# Patient Record
Sex: Male | Born: 1950
Health system: Southern US, Community
[De-identification: ages and names within clinical notes are randomized; demographics above are authoritative.]

## PROBLEM LIST (undated history)

## (undated) DIAGNOSIS — D649 Anemia, unspecified: Secondary | ICD-10-CM

## (undated) DIAGNOSIS — I519 Heart disease, unspecified: Secondary | ICD-10-CM

## (undated) DIAGNOSIS — I10 Essential (primary) hypertension: Secondary | ICD-10-CM

## (undated) DIAGNOSIS — R55 Syncope and collapse: Secondary | ICD-10-CM

## (undated) DIAGNOSIS — J449 Chronic obstructive pulmonary disease, unspecified: Secondary | ICD-10-CM

## (undated) DIAGNOSIS — I4891 Unspecified atrial fibrillation: Secondary | ICD-10-CM

## (undated) DIAGNOSIS — I509 Heart failure, unspecified: Secondary | ICD-10-CM

## (undated) HISTORY — DX: Syncope and collapse: R55

## (undated) HISTORY — PX: ESOPHAGUS SURGERY: SHX626

---

## 2000-04-13 ENCOUNTER — Encounter: Payer: Self-pay | Admitting: Emergency Medicine

## 2000-04-13 ENCOUNTER — Emergency Department (HOSPITAL_COMMUNITY): Admission: EM | Admit: 2000-04-13 | Discharge: 2000-04-13 | Payer: Self-pay | Admitting: Emergency Medicine

## 2000-12-25 HISTORY — PX: FOREIGN BODY REMOVAL ESOPHAGEAL: SHX5322

## 2000-12-25 HISTORY — PX: ESOPHAGOSCOPY: SUR460

## 2001-01-28 ENCOUNTER — Encounter: Payer: Self-pay | Admitting: Otolaryngology

## 2001-01-28 ENCOUNTER — Inpatient Hospital Stay (HOSPITAL_COMMUNITY): Admission: EM | Admit: 2001-01-28 | Discharge: 2001-02-07 | Payer: Self-pay | Admitting: Emergency Medicine

## 2001-01-28 ENCOUNTER — Encounter: Payer: Self-pay | Admitting: Emergency Medicine

## 2001-01-28 ENCOUNTER — Encounter (INDEPENDENT_AMBULATORY_CARE_PROVIDER_SITE_OTHER): Payer: Self-pay | Admitting: Specialist

## 2001-01-29 ENCOUNTER — Encounter: Payer: Self-pay | Admitting: Pulmonary Disease

## 2001-01-29 ENCOUNTER — Encounter: Payer: Self-pay | Admitting: Otolaryngology

## 2001-01-30 ENCOUNTER — Encounter: Payer: Self-pay | Admitting: Otolaryngology

## 2001-01-31 ENCOUNTER — Encounter: Payer: Self-pay | Admitting: Otolaryngology

## 2001-02-04 ENCOUNTER — Encounter: Payer: Self-pay | Admitting: Otolaryngology

## 2001-02-05 ENCOUNTER — Encounter: Payer: Self-pay | Admitting: Otolaryngology

## 2001-07-28 ENCOUNTER — Emergency Department (HOSPITAL_COMMUNITY): Admission: EM | Admit: 2001-07-28 | Discharge: 2001-07-28 | Payer: Self-pay | Admitting: Emergency Medicine

## 2002-07-06 ENCOUNTER — Encounter (INDEPENDENT_AMBULATORY_CARE_PROVIDER_SITE_OTHER): Payer: Self-pay | Admitting: *Deleted

## 2002-07-06 ENCOUNTER — Encounter: Payer: Self-pay | Admitting: Emergency Medicine

## 2002-07-06 ENCOUNTER — Inpatient Hospital Stay (HOSPITAL_COMMUNITY): Admission: EM | Admit: 2002-07-06 | Discharge: 2002-07-15 | Payer: Self-pay | Admitting: Emergency Medicine

## 2002-07-07 ENCOUNTER — Encounter: Payer: Self-pay | Admitting: Interventional Cardiology

## 2002-07-07 ENCOUNTER — Encounter: Payer: Self-pay | Admitting: Internal Medicine

## 2002-07-08 ENCOUNTER — Encounter: Payer: Self-pay | Admitting: Internal Medicine

## 2002-07-09 ENCOUNTER — Encounter: Payer: Self-pay | Admitting: Internal Medicine

## 2002-07-11 ENCOUNTER — Encounter: Payer: Self-pay | Admitting: Surgery

## 2002-07-11 ENCOUNTER — Encounter: Payer: Self-pay | Admitting: Internal Medicine

## 2002-07-14 ENCOUNTER — Encounter: Payer: Self-pay | Admitting: Internal Medicine

## 2003-04-25 ENCOUNTER — Emergency Department (HOSPITAL_COMMUNITY): Admission: EM | Admit: 2003-04-25 | Discharge: 2003-04-25 | Payer: Self-pay | Admitting: Emergency Medicine

## 2004-12-30 ENCOUNTER — Emergency Department (HOSPITAL_COMMUNITY): Admission: EM | Admit: 2004-12-30 | Discharge: 2004-12-30 | Payer: Self-pay | Admitting: Emergency Medicine

## 2005-02-20 ENCOUNTER — Emergency Department (HOSPITAL_COMMUNITY): Admission: EM | Admit: 2005-02-20 | Discharge: 2005-02-20 | Payer: Self-pay | Admitting: Emergency Medicine

## 2005-11-09 ENCOUNTER — Emergency Department (HOSPITAL_COMMUNITY): Admission: EM | Admit: 2005-11-09 | Discharge: 2005-11-09 | Payer: Self-pay | Admitting: Emergency Medicine

## 2007-12-07 ENCOUNTER — Emergency Department (HOSPITAL_COMMUNITY): Admission: EM | Admit: 2007-12-07 | Discharge: 2007-12-07 | Payer: Self-pay | Admitting: Emergency Medicine

## 2008-08-21 ENCOUNTER — Inpatient Hospital Stay (HOSPITAL_COMMUNITY): Admission: EM | Admit: 2008-08-21 | Discharge: 2008-08-28 | Payer: Self-pay | Admitting: Emergency Medicine

## 2008-08-21 ENCOUNTER — Ambulatory Visit: Payer: Self-pay | Admitting: Internal Medicine

## 2008-08-22 ENCOUNTER — Ambulatory Visit: Payer: Self-pay | Admitting: Gastroenterology

## 2008-08-24 ENCOUNTER — Encounter (INDEPENDENT_AMBULATORY_CARE_PROVIDER_SITE_OTHER): Payer: Self-pay | Admitting: Emergency Medicine

## 2008-08-26 ENCOUNTER — Encounter (INDEPENDENT_AMBULATORY_CARE_PROVIDER_SITE_OTHER): Payer: Self-pay | Admitting: Gastroenterology

## 2008-08-26 DIAGNOSIS — K209 Esophagitis, unspecified without bleeding: Secondary | ICD-10-CM | POA: Insufficient documentation

## 2008-08-26 DIAGNOSIS — K224 Dyskinesia of esophagus: Secondary | ICD-10-CM

## 2008-08-26 DIAGNOSIS — K2900 Acute gastritis without bleeding: Secondary | ICD-10-CM | POA: Insufficient documentation

## 2008-08-28 ENCOUNTER — Encounter (INDEPENDENT_AMBULATORY_CARE_PROVIDER_SITE_OTHER): Payer: Self-pay | Admitting: Family Medicine

## 2008-09-01 ENCOUNTER — Ambulatory Visit: Payer: Self-pay | Admitting: *Deleted

## 2008-09-16 ENCOUNTER — Telehealth (INDEPENDENT_AMBULATORY_CARE_PROVIDER_SITE_OTHER): Payer: Self-pay | Admitting: *Deleted

## 2008-09-21 ENCOUNTER — Ambulatory Visit: Payer: Self-pay | Admitting: Family Medicine

## 2008-09-21 DIAGNOSIS — I480 Paroxysmal atrial fibrillation: Secondary | ICD-10-CM | POA: Insufficient documentation

## 2008-09-21 DIAGNOSIS — J45909 Unspecified asthma, uncomplicated: Secondary | ICD-10-CM

## 2008-09-21 DIAGNOSIS — D508 Other iron deficiency anemias: Secondary | ICD-10-CM

## 2008-09-21 DIAGNOSIS — I495 Sick sinus syndrome: Secondary | ICD-10-CM | POA: Insufficient documentation

## 2008-09-21 DIAGNOSIS — I4891 Unspecified atrial fibrillation: Secondary | ICD-10-CM

## 2008-09-24 DIAGNOSIS — D529 Folate deficiency anemia, unspecified: Secondary | ICD-10-CM | POA: Insufficient documentation

## 2008-09-28 ENCOUNTER — Ambulatory Visit (HOSPITAL_COMMUNITY): Admission: RE | Admit: 2008-09-28 | Discharge: 2008-09-28 | Payer: Self-pay | Admitting: Family Medicine

## 2008-09-28 ENCOUNTER — Encounter (INDEPENDENT_AMBULATORY_CARE_PROVIDER_SITE_OTHER): Payer: Self-pay | Admitting: Family Medicine

## 2008-09-29 LAB — CONVERTED CEMR LAB
AST: 20 units/L (ref 0–37)
BUN: 13 mg/dL (ref 6–23)
Basophils Relative: 1 % (ref 0–1)
CO2: 20 meq/L (ref 19–32)
Calcium: 8.4 mg/dL (ref 8.4–10.5)
Eosinophils Relative: 7 % — ABNORMAL HIGH (ref 0–5)
Folate: 4.2 ng/mL
Glucose, Bld: 79 mg/dL (ref 70–99)
Hemoglobin: 11 g/dL — ABNORMAL LOW (ref 13.0–17.0)
MCV: 67.3 fL — ABNORMAL LOW (ref 78.0–100.0)
Monocytes Relative: 12 % (ref 3–12)
Neutro Abs: 3.8 10*3/uL (ref 1.7–7.7)
Platelets: 370 10*3/uL (ref 150–400)
TSH: 1.888 microintl units/mL (ref 0.350–4.50)
Total Bilirubin: 0.3 mg/dL (ref 0.3–1.2)
Total Protein: 7.8 g/dL (ref 6.0–8.3)
Vitamin B-12: 252 pg/mL (ref 211–911)
WBC: 8.1 10*3/uL (ref 4.0–10.5)

## 2008-10-01 ENCOUNTER — Ambulatory Visit: Payer: Self-pay | Admitting: Family Medicine

## 2008-10-01 LAB — CONVERTED CEMR LAB
Iron: 39 ug/dL — ABNORMAL LOW (ref 42–165)
Saturation Ratios: 12 % — ABNORMAL LOW (ref 20–55)
TIBC: 320 ug/dL (ref 215–435)

## 2008-10-18 ENCOUNTER — Telehealth (INDEPENDENT_AMBULATORY_CARE_PROVIDER_SITE_OTHER): Payer: Self-pay | Admitting: *Deleted

## 2008-11-09 ENCOUNTER — Encounter (INDEPENDENT_AMBULATORY_CARE_PROVIDER_SITE_OTHER): Payer: Self-pay | Admitting: Family Medicine

## 2008-11-11 ENCOUNTER — Telehealth (INDEPENDENT_AMBULATORY_CARE_PROVIDER_SITE_OTHER): Payer: Self-pay | Admitting: Family Medicine

## 2008-11-26 ENCOUNTER — Ambulatory Visit: Payer: Self-pay | Admitting: Family Medicine

## 2008-11-26 DIAGNOSIS — F528 Other sexual dysfunction not due to a substance or known physiological condition: Secondary | ICD-10-CM | POA: Insufficient documentation

## 2008-11-26 DIAGNOSIS — I10 Essential (primary) hypertension: Secondary | ICD-10-CM

## 2008-12-01 LAB — CONVERTED CEMR LAB
ALT: 16 units/L (ref 0–53)
AST: 20 units/L (ref 0–37)
BUN: 13 mg/dL (ref 6–23)
Calcium: 9 mg/dL (ref 8.4–10.5)
Cholesterol: 177 mg/dL (ref 0–200)
Creatinine, Ser: 1.11 mg/dL (ref 0.40–1.50)
Potassium: 4.2 meq/L (ref 3.5–5.3)
Sodium: 141 meq/L (ref 135–145)
Total Bilirubin: 0.4 mg/dL (ref 0.3–1.2)
Total CHOL/HDL Ratio: 3.8
VLDL: 15 mg/dL (ref 0–40)

## 2008-12-03 ENCOUNTER — Encounter (INDEPENDENT_AMBULATORY_CARE_PROVIDER_SITE_OTHER): Payer: Self-pay | Admitting: Family Medicine

## 2009-02-02 ENCOUNTER — Ambulatory Visit: Payer: Self-pay | Admitting: Family Medicine

## 2009-02-03 ENCOUNTER — Encounter (INDEPENDENT_AMBULATORY_CARE_PROVIDER_SITE_OTHER): Payer: Self-pay | Admitting: Family Medicine

## 2009-02-03 LAB — CONVERTED CEMR LAB
Eosinophils Relative: 6 % — ABNORMAL HIGH (ref 0–5)
Lymphocytes Relative: 30 % (ref 12–46)
MCV: 68.2 fL — ABNORMAL LOW (ref 78.0–100.0)
Monocytes Absolute: 1.3 10*3/uL — ABNORMAL HIGH (ref 0.1–1.0)
Neutrophils Relative %: 51 % (ref 43–77)
Platelets: 345 10*3/uL (ref 150–400)
RBC Folate: 418 ng/mL (ref 180–600)
Vitamin B-12: 370 pg/mL (ref 211–911)

## 2009-02-16 ENCOUNTER — Telehealth (INDEPENDENT_AMBULATORY_CARE_PROVIDER_SITE_OTHER): Payer: Self-pay | Admitting: *Deleted

## 2009-03-10 ENCOUNTER — Ambulatory Visit: Payer: Self-pay | Admitting: Family Medicine

## 2009-03-12 LAB — CONVERTED CEMR LAB
AST: 19 units/L (ref 0–37)
Alkaline Phosphatase: 95 units/L (ref 39–117)
BUN: 12 mg/dL (ref 6–23)
CO2: 25 meq/L (ref 19–32)
Chloride: 101 meq/L (ref 96–112)
Cholesterol: 172 mg/dL (ref 0–200)
Glucose, Bld: 83 mg/dL (ref 70–99)
Potassium: 3.8 meq/L (ref 3.5–5.3)
Sodium: 138 meq/L (ref 135–145)
Total Bilirubin: 0.2 mg/dL — ABNORMAL LOW (ref 0.3–1.2)
Triglycerides: 158 mg/dL — ABNORMAL HIGH (ref <150)

## 2009-04-29 ENCOUNTER — Telehealth (INDEPENDENT_AMBULATORY_CARE_PROVIDER_SITE_OTHER): Payer: Self-pay | Admitting: Family Medicine

## 2009-09-28 ENCOUNTER — Encounter: Payer: Self-pay | Admitting: Physician Assistant

## 2009-10-14 ENCOUNTER — Ambulatory Visit: Payer: Self-pay | Admitting: Physician Assistant

## 2009-10-25 ENCOUNTER — Ambulatory Visit: Payer: Self-pay | Admitting: Physician Assistant

## 2009-10-25 LAB — CONVERTED CEMR LAB
ALT: 10 units/L (ref 0–53)
AST: 18 units/L (ref 0–37)
Albumin: 3.9 g/dL (ref 3.5–5.2)
Basophils Absolute: 0.1 10*3/uL (ref 0.0–0.1)
Basophils Relative: 1 % (ref 0–1)
CO2: 22 meq/L (ref 19–32)
Chloride: 104 meq/L (ref 96–112)
Cholesterol: 151 mg/dL (ref 0–200)
Eosinophils Absolute: 0.4 10*3/uL (ref 0.0–0.7)
Eosinophils Relative: 5 % (ref 0–5)
Free T4: 0.92 ng/dL (ref 0.80–1.80)
Glucose, Bld: 69 mg/dL — ABNORMAL LOW (ref 70–99)
HCT: 35.6 % — ABNORMAL LOW (ref 39.0–52.0)
HDL: 44 mg/dL (ref >39)
LDL Cholesterol: 93 mg/dL (ref 0–99)
Lymphocytes Relative: 27 % (ref 12–46)
Lymphs Abs: 2.4 10*3/uL (ref 0.7–4.0)
Monocytes Absolute: 0.7 10*3/uL (ref 0.1–1.0)
Monocytes Relative: 8 % (ref 3–12)
Potassium: 4.1 meq/L (ref 3.5–5.3)
RBC: 5.23 M/uL (ref 4.22–5.81)
RDW: 16.4 % — ABNORMAL HIGH (ref 11.5–15.5)
Sodium: 141 meq/L (ref 135–145)
TSH: 3.059 microintl units/mL (ref 0.350–4.500)

## 2009-12-13 ENCOUNTER — Telehealth: Payer: Self-pay | Admitting: Physician Assistant

## 2009-12-28 ENCOUNTER — Encounter: Payer: Self-pay | Admitting: Physician Assistant

## 2010-01-13 ENCOUNTER — Telehealth: Payer: Self-pay | Admitting: Physician Assistant

## 2010-01-20 ENCOUNTER — Ambulatory Visit: Payer: Self-pay | Admitting: Physician Assistant

## 2010-01-20 DIAGNOSIS — E291 Testicular hypofunction: Secondary | ICD-10-CM | POA: Insufficient documentation

## 2010-01-21 ENCOUNTER — Telehealth: Payer: Self-pay | Admitting: Physician Assistant

## 2010-02-14 ENCOUNTER — Ambulatory Visit: Payer: Self-pay | Admitting: Physician Assistant

## 2010-02-14 LAB — CONVERTED CEMR LAB
ALT: 10 units/L (ref 0–53)
AST: 19 units/L (ref 0–37)
Albumin: 3.7 g/dL (ref 3.5–5.2)
BUN: 12 mg/dL (ref 6–23)
Calcium: 8.9 mg/dL (ref 8.4–10.5)
Creatinine, Ser: 1.08 mg/dL (ref 0.40–1.50)
Eosinophils Absolute: 0.5 10*3/uL (ref 0.0–0.7)
Lymphs Abs: 2.1 10*3/uL (ref 0.7–4.0)
MCHC: 30 g/dL (ref 30.0–36.0)
Monocytes Relative: 12 % (ref 3–12)
Neutro Abs: 3.7 10*3/uL (ref 1.7–7.7)
Platelets: 393 10*3/uL (ref 150–400)
Potassium: 4.2 meq/L (ref 3.5–5.3)
Total Bilirubin: 0.3 mg/dL (ref 0.3–1.2)
Total Protein: 7.9 g/dL (ref 6.0–8.3)

## 2010-02-15 ENCOUNTER — Telehealth: Payer: Self-pay | Admitting: Physician Assistant

## 2010-02-16 ENCOUNTER — Encounter: Payer: Self-pay | Admitting: Physician Assistant

## 2010-02-18 ENCOUNTER — Telehealth: Payer: Self-pay | Admitting: Physician Assistant

## 2010-02-24 ENCOUNTER — Telehealth: Payer: Self-pay | Admitting: Physician Assistant

## 2010-02-24 LAB — CONVERTED CEMR LAB
Saturation Ratios: 11 % — ABNORMAL LOW (ref 20–55)
Testosterone-% Free: 1.7 % (ref 1.6–2.9)
Testosterone: 194.83 ng/dL — ABNORMAL LOW (ref 350–890)

## 2010-03-12 ENCOUNTER — Telehealth: Payer: Self-pay | Admitting: Physician Assistant

## 2010-03-14 ENCOUNTER — Ambulatory Visit: Payer: Self-pay | Admitting: Physician Assistant

## 2010-03-14 LAB — CONVERTED CEMR LAB
LH: 4.8 milliintl units/mL (ref 1.5–9.3)
PSA: 0.3 ng/mL (ref 0.10–4.00)

## 2010-03-25 ENCOUNTER — Encounter: Payer: Self-pay | Admitting: Physician Assistant

## 2010-03-25 ENCOUNTER — Telehealth: Payer: Self-pay | Admitting: Physician Assistant

## 2010-03-25 DIAGNOSIS — E559 Vitamin D deficiency, unspecified: Secondary | ICD-10-CM

## 2010-03-29 ENCOUNTER — Telehealth (INDEPENDENT_AMBULATORY_CARE_PROVIDER_SITE_OTHER): Payer: Self-pay | Admitting: *Deleted

## 2010-06-14 ENCOUNTER — Telehealth: Payer: Self-pay | Admitting: Physician Assistant

## 2010-06-14 ENCOUNTER — Encounter: Payer: Self-pay | Admitting: Physician Assistant

## 2010-06-28 ENCOUNTER — Ambulatory Visit: Payer: Self-pay | Admitting: Physician Assistant

## 2010-06-28 ENCOUNTER — Telehealth: Payer: Self-pay | Admitting: Physician Assistant

## 2010-06-28 DIAGNOSIS — R259 Unspecified abnormal involuntary movements: Secondary | ICD-10-CM | POA: Insufficient documentation

## 2010-06-28 DIAGNOSIS — R5383 Other fatigue: Secondary | ICD-10-CM | POA: Insufficient documentation

## 2010-06-30 ENCOUNTER — Ambulatory Visit: Payer: Self-pay | Admitting: Physician Assistant

## 2010-07-01 LAB — CONVERTED CEMR LAB
ALT: 12 units/L (ref 0–53)
Albumin: 3.5 g/dL (ref 3.5–5.2)
Alkaline Phosphatase: 86 units/L (ref 39–117)
BUN: 12 mg/dL (ref 6–23)
Basophils Absolute: 0.1 10*3/uL (ref 0.0–0.1)
Basophils Relative: 1 % (ref 0–1)
CO2: 25 meq/L (ref 19–32)
Chloride: 103 meq/L (ref 96–112)
Creatinine, Ser: 0.9 mg/dL (ref 0.40–1.50)
Eosinophils Absolute: 0.6 10*3/uL (ref 0.0–0.7)
Eosinophils Relative: 7 % — ABNORMAL HIGH (ref 0–5)
Glucose, Bld: 82 mg/dL (ref 70–99)
HDL: 45 mg/dL (ref >39)
Hemoglobin: 10.6 g/dL — ABNORMAL LOW (ref 13.0–17.0)
Lymphocytes Relative: 31 % (ref 12–46)
MCHC: 31.2 g/dL (ref 30.0–36.0)
Neutro Abs: 3.9 10*3/uL (ref 1.7–7.7)
Neutrophils Relative %: 49 % (ref 43–77)
Potassium: 4.1 meq/L (ref 3.5–5.3)
Sodium: 138 meq/L (ref 135–145)
Total Bilirubin: 0.4 mg/dL (ref 0.3–1.2)
Total Protein: 7.4 g/dL (ref 6.0–8.3)
VLDL: 17 mg/dL (ref 0–40)
Vit D, 25-Hydroxy: 12 ng/mL — ABNORMAL LOW (ref 30–89)

## 2010-07-29 ENCOUNTER — Encounter: Payer: Self-pay | Admitting: Physician Assistant

## 2010-07-29 ENCOUNTER — Ambulatory Visit (HOSPITAL_BASED_OUTPATIENT_CLINIC_OR_DEPARTMENT_OTHER): Admission: RE | Admit: 2010-07-29 | Discharge: 2010-07-29 | Payer: Self-pay | Admitting: Internal Medicine

## 2010-07-31 ENCOUNTER — Ambulatory Visit: Payer: Self-pay | Admitting: Internal Medicine

## 2010-08-01 ENCOUNTER — Ambulatory Visit: Payer: Self-pay | Admitting: Physician Assistant

## 2010-08-01 LAB — CONVERTED CEMR LAB
Basophils Relative: 1 % (ref 0–1)
Eosinophils Absolute: 0.4 10*3/uL (ref 0.0–0.7)
MCV: 69.5 fL — ABNORMAL LOW (ref 78.0–100.0)
Neutro Abs: 4.1 10*3/uL (ref 1.7–7.7)
Platelets: 364 10*3/uL (ref 150–400)
RBC: 5.24 M/uL (ref 4.22–5.81)

## 2010-08-03 ENCOUNTER — Inpatient Hospital Stay (HOSPITAL_COMMUNITY): Admission: EM | Admit: 2010-08-03 | Discharge: 2010-08-05 | Payer: Self-pay | Admitting: Emergency Medicine

## 2010-08-03 ENCOUNTER — Encounter: Payer: Self-pay | Admitting: Physician Assistant

## 2010-08-05 ENCOUNTER — Telehealth: Payer: Self-pay | Admitting: Physician Assistant

## 2010-08-05 ENCOUNTER — Encounter: Payer: Self-pay | Admitting: Physician Assistant

## 2010-08-20 ENCOUNTER — Telehealth (INDEPENDENT_AMBULATORY_CARE_PROVIDER_SITE_OTHER): Payer: Self-pay | Admitting: *Deleted

## 2010-08-20 DIAGNOSIS — G4730 Sleep apnea, unspecified: Secondary | ICD-10-CM | POA: Insufficient documentation

## 2010-08-20 DIAGNOSIS — G4733 Obstructive sleep apnea (adult) (pediatric): Secondary | ICD-10-CM | POA: Insufficient documentation

## 2010-09-01 ENCOUNTER — Ambulatory Visit: Payer: Self-pay | Admitting: Physician Assistant

## 2010-09-01 DIAGNOSIS — R079 Chest pain, unspecified: Secondary | ICD-10-CM | POA: Insufficient documentation

## 2010-09-01 DIAGNOSIS — R42 Dizziness and giddiness: Secondary | ICD-10-CM | POA: Insufficient documentation

## 2010-09-01 DIAGNOSIS — K219 Gastro-esophageal reflux disease without esophagitis: Secondary | ICD-10-CM

## 2010-09-02 ENCOUNTER — Encounter: Payer: Self-pay | Admitting: Physician Assistant

## 2010-09-07 ENCOUNTER — Encounter: Payer: Self-pay | Admitting: Physician Assistant

## 2010-09-13 ENCOUNTER — Encounter: Payer: Self-pay | Admitting: Physician Assistant

## 2010-09-23 ENCOUNTER — Encounter: Payer: Self-pay | Admitting: Physician Assistant

## 2010-09-23 DIAGNOSIS — E291 Testicular hypofunction: Secondary | ICD-10-CM | POA: Insufficient documentation

## 2010-09-29 ENCOUNTER — Telehealth (INDEPENDENT_AMBULATORY_CARE_PROVIDER_SITE_OTHER): Payer: Self-pay | Admitting: *Deleted

## 2010-09-30 ENCOUNTER — Telehealth: Payer: Self-pay | Admitting: Physician Assistant

## 2010-09-30 ENCOUNTER — Ambulatory Visit: Payer: Self-pay | Admitting: Physician Assistant

## 2010-09-30 DIAGNOSIS — B356 Tinea cruris: Secondary | ICD-10-CM

## 2010-10-20 ENCOUNTER — Ambulatory Visit: Payer: Self-pay | Admitting: Nurse Practitioner

## 2010-12-20 ENCOUNTER — Encounter: Payer: Self-pay | Admitting: Physician Assistant

## 2011-01-03 ENCOUNTER — Encounter (INDEPENDENT_AMBULATORY_CARE_PROVIDER_SITE_OTHER): Payer: Self-pay | Admitting: Nurse Practitioner

## 2011-01-06 ENCOUNTER — Encounter (INDEPENDENT_AMBULATORY_CARE_PROVIDER_SITE_OTHER): Payer: Self-pay | Admitting: Nurse Practitioner

## 2011-01-09 ENCOUNTER — Encounter (INDEPENDENT_AMBULATORY_CARE_PROVIDER_SITE_OTHER): Payer: Self-pay | Admitting: Internal Medicine

## 2011-01-11 ENCOUNTER — Ambulatory Visit
Admission: RE | Admit: 2011-01-11 | Discharge: 2011-01-11 | Payer: Self-pay | Source: Home / Self Care | Attending: Nurse Practitioner | Admitting: Nurse Practitioner

## 2011-01-11 ENCOUNTER — Encounter (INDEPENDENT_AMBULATORY_CARE_PROVIDER_SITE_OTHER): Payer: Self-pay | Admitting: Nurse Practitioner

## 2011-01-12 LAB — CONVERTED CEMR LAB
ALT: 16 units/L (ref 0–53)
Albumin: 4 g/dL (ref 3.5–5.2)
Alkaline Phosphatase: 89 units/L (ref 39–117)
BUN: 9 mg/dL (ref 6–23)
CO2: 28 meq/L (ref 19–32)
Calcium: 9.4 mg/dL (ref 8.4–10.5)
Cholesterol: 166 mg/dL (ref 0–200)
Creatinine, Ser: 0.99 mg/dL (ref 0.40–1.50)
Glucose, Bld: 79 mg/dL (ref 70–99)
LDL Cholesterol: 100 mg/dL — ABNORMAL HIGH (ref 0–99)
Total Protein: 8 g/dL (ref 6.0–8.3)
VLDL: 16 mg/dL (ref 0–40)

## 2011-01-13 ENCOUNTER — Telehealth (INDEPENDENT_AMBULATORY_CARE_PROVIDER_SITE_OTHER): Payer: Self-pay | Admitting: Internal Medicine

## 2011-01-13 ENCOUNTER — Encounter (INDEPENDENT_AMBULATORY_CARE_PROVIDER_SITE_OTHER): Payer: Self-pay | Admitting: Nurse Practitioner

## 2011-01-13 LAB — CONVERTED CEMR LAB
Amphetamine Screen, Ur: NEGATIVE
Cocaine Metabolites: NEGATIVE
Marijuana Metabolite: NEGATIVE
Methadone: NEGATIVE
Opiate Screen, Urine: NEGATIVE
Phencyclidine (PCP): NEGATIVE
Propoxyphene: NEGATIVE

## 2011-01-16 ENCOUNTER — Encounter (INDEPENDENT_AMBULATORY_CARE_PROVIDER_SITE_OTHER): Payer: Self-pay | Admitting: Nurse Practitioner

## 2011-01-22 LAB — CONVERTED CEMR LAB
Basophils Absolute: 0.1 10*3/uL (ref 0.0–0.1)
Basophils Relative: 1 % (ref 0–1)
Eosinophils Absolute: 0.6 10*3/uL (ref 0.0–0.7)
Eosinophils Relative: 6 % — ABNORMAL HIGH (ref 0–5)
HCT: 35.9 % — ABNORMAL LOW (ref 39.0–52.0)
Hemoglobin: 10.8 g/dL — ABNORMAL LOW (ref 13.0–17.0)
Lymphocytes Relative: 23 % (ref 12–46)
Lymphs Abs: 2.3 10*3/uL (ref 0.7–4.0)
MCHC: 30.1 g/dL (ref 30.0–36.0)
MCV: 69.6 fL — ABNORMAL LOW (ref 78.0–100.0)
Monocytes Absolute: 1.3 10*3/uL — ABNORMAL HIGH (ref 0.1–1.0)
Monocytes Relative: 13 % — ABNORMAL HIGH (ref 3–12)
Neutro Abs: 5.8 10*3/uL (ref 1.7–7.7)
Neutrophils Relative %: 58 % (ref 43–77)
Platelets: 394 10*3/uL (ref 150–400)
RBC: 5.16 M/uL (ref 4.22–5.81)
RDW: 17.2 % — ABNORMAL HIGH (ref 11.5–15.5)
WBC: 10 10*3/uL (ref 4.0–10.5)

## 2011-01-24 NOTE — Progress Notes (Signed)
Summary: GI referral  Phone Note Outgoing Call   Summary of Call: Please check on GI referral.  He was supposed to see them months ago.  Has not rec'd appt.   Initial call taken by: Tereso Newcomer PA-C,  September 30, 2010 11:57 AM  Follow-up for Phone Call        I referral tp to [facility]  Follow-up by: Cheryll Dessert,  October 07, 2010 5:18 PM

## 2011-01-24 NOTE — Miscellaneous (Signed)
Summary: Lab Review   Review of labs indicates he has secondary hypogonadism. Will try to review with endocrinology to determine path of w/u.   Clinical Lists Changes  Problems: Added new problem of HYPOGONADOTROPIC HYPOGONADISM (ICD-253.4) Assessed HYPOGONADOTROPIC HYPOGONADISM as comment only - Review of labs indicates he has secondary hypogonadism. Will try to review with endocrinology to determine path of w/u. Assessed IRON DEFIC ANEMIA SEC DIET IRON INTAKE as comment only - review of labs indicates chronic iron deficiency he had a colo and EGD in 2009 has been referred back to [facility] this year due to dysphagia  His updated medication list for this problem includes:    Ferrous Sulfate 325 (65 Fe) Mg Tabs (Ferrous sulfate) .Marland Kitchen... 1 by mouth three times a day    Folic Acid 1 Mg Tabs (Folic acid) .Marland Kitchen... Take 1 tablet by mouth once a day        Impression & Recommendations:  Problem # 1:  HYPOGONADOTROPIC HYPOGONADISM (ICD-253.4) Review of labs indicates he has secondary hypogonadism. Will try to review with endocrinology to determine path of w/u.  Problem # 2:  IRON DEFIC ANEMIA SEC DIET IRON INTAKE (ICD-280.1) review of labs indicates chronic iron deficiency he had a colo and EGD in 2009 has been referred back to [facility] this year due to dysphagia  His updated medication list for this problem includes:    Ferrous Sulfate 325 (65 Fe) Mg Tabs (Ferrous sulfate) .Marland Kitchen... 1 by mouth three times a day    Folic Acid 1 Mg Tabs (Folic acid) .Marland Kitchen... Take 1 tablet by mouth once a day  Complete Medication List: 1)  Ferrous Sulfate 325 (65 Fe) Mg Tabs (Ferrous sulfate) .Marland Kitchen.. 1 by mouth three times a day 2)  Lisinopril 10 Mg Tabs (Lisinopril) .Marland Kitchen.. 1 by mouth once daily 3)  Amiodarone Hcl 200 Mg Tabs (Amiodarone hcl) .Marland Kitchen.. 1 by mouth once daily 4)  Folic Acid 1 Mg Tabs (Folic acid) .... Take 1 tablet by mouth once a day 5)  Reglan 10 Mg Tabs (Metoclopramide hcl) .... Take one tablet by mouth before  each meal and at bedtime to help with reflux 6)  Protonix 40 Mg Tbec (Pantoprazole sodium) .... Take 2 tablets by mouth every 12 hours 7)  Advair Diskus 250-50 Mcg/dose Misc (Fluticasone-salmeterol) .Marland Kitchen.. 1 puff 2 times daily rinse and spit after use 8)  Proventil Hfa 108 (90 Base) Mcg/act Aers (Albuterol sulfate) .Marland Kitchen.. 1-2 puffs q 4-6 hours as needed

## 2011-01-24 NOTE — Progress Notes (Signed)
  Phone Note Outgoing Call   Summary of Call: Patient's cardiologist requested some labs recently. I believe he requested CMET, CBC, TSH and Lipids. Make sure you fax him a copy. Initial call taken by: Tereso Newcomer PA-C,  February 24, 2010 3:45 PM  Follow-up for Phone Call        yes faxed copy to them Follow-up by: [place] Shannon,  February 24, 2010 3:48 PM

## 2011-01-24 NOTE — Progress Notes (Signed)
Summary: Anemia  Phone Note Outgoing Call   Summary of Call: Patient states that he has a follow-up appointment with GI  next month..... Patient current  in the hospital... been in hospital since 08/03/2010. sheduled an appointment for hosptial follow-up on 09/01/2010.Marland Kitchen [place] Shannon  August 05, 2010 5:01 PM   Blood counts are the same. Please make sure he has a f/u appt scheduled with GI at [facility].  IF no, please let me know. I am writing a referral letter.  Please make sure this goes to [facility]. Have him do stool cards x 3 as soon as he can do them. Initial call taken by: Tereso Newcomer PA-C,  August 05, 2010 9:00 AM  Follow-up for Phone Call        Left message on answering machine for pt to call back...Marland KitchenMarland KitchenArmenia Shannon  August 05, 2010 9:10 AM         EGD  Procedure date:  08/25/2008  Findings:      mild gastritis esophagitis hiatal hernia  Comments:      done for anemia, heme + stool   Past History:  Past Medical History: Current Problems:  IRON DEFIC ANEMIA SEC DIET IRON INTAKE (ICD-280.1)..Lab result: Hemoccults/o EGD and colonoscopy 08/2008.   a.  colo with hem's and diverticula and EGD with gastritis (d/c summ. indicates probable chronic blood loss) ASTHMA, UNSPECIFIED, UNSPECIFIED STATUS (ICD-493.90) ATRIAL FIBRILLATION (ICD-427.31)...sees Dr.Harwani SICK SINUS SYNDROME (ICD-427.81) NEEDLE PHOBIA...medical anxiety chest pain admx 8.2011   a.  myoview neg for ischemia   Impression & Recommendations:  Problem # 1:  IRON DEFIC ANEMIA SEC DIET IRON INTAKE (ICD-280.1) chronic hgb electrophoresis previously normal had colo in [place] in 08/2008 with heme + stools had EGD as well gastritis on EGD and Lg hem's on colo ? chronic GI blood loss on long term iron tx supposed to be following up with GI at [facility]  His updated medication list for this problem includes:    Ferrous Sulfate 325 (65 Fe) Mg Tabs (Ferrous sulfate) .Marland Kitchen... 1 by mouth three times a day    Folic  Acid 1 Mg Tabs (Folic acid) .Marland Kitchen... Take 1 tablet by mouth once a day  Complete Medication List: 1)  Ferrous Sulfate 325 (65 Fe) Mg Tabs (Ferrous sulfate) .Marland Kitchen.. 1 by mouth three times a day 2)  Lisinopril 10 Mg Tabs (Lisinopril) .Marland Kitchen.. 1 by mouth once daily 3)  Amiodarone Hcl 200 Mg Tabs (Amiodarone hcl) .Marland Kitchen.. 1 by mouth once daily 4)  Folic Acid 1 Mg Tabs (Folic acid) .... Take 1 tablet by mouth once a day 5)  Reglan 10 Mg Tabs (Metoclopramide hcl) .... Take one tablet by mouth before each meal and at bedtime to help with reflux 6)  Protonix 40 Mg Tbec (Pantoprazole sodium) .... Take 2 tablets by mouth every 12 hours 7)  Advair Diskus 500-50 Mcg/dose Aepb (Fluticasone-salmeterol) .Marland Kitchen.. 1 puff two times a day (rinse and spit after each use) 8)  Proventil Hfa 108 (90 Base) Mcg/act Aers (Albuterol sulfate) .Marland Kitchen.. 1-2 puffs q 4-6 hours as needed 9)  Ergocalciferol 50000 Unit Caps (Ergocalciferol) .Marland Kitchen.. 1 by mouth once a week for 12 weeks

## 2011-01-24 NOTE — Letter (Signed)
Summary: [facility]/CHIEF COMPLAINT//HYPOGONADISM  [facility]/CHIEF COMPLAINT//HYPOGONADISM   Imported By: Arta Bruce 10/11/2010 15:35:51  _____________________________________________________________________  External Attachment:    Type:   Image     Comment:   External Document

## 2011-01-24 NOTE — Progress Notes (Signed)
  Phone Note Outgoing Call   Summary of Call: I ordered labs on him when I saw him in Jan. They were never done and I thought they would be done with the labs he came in for today. They were not done. See labs under orders and have lab add on or bring patient back for these. Printed off order and put on your desk. Initial call taken by: Brynda Rim,  February 15, 2010 5:57 PM  Follow-up for Phone Call        pt had labs completed on 2/21 please see results.. Follow-up by: Mikey College CMA,  February 16, 2010 10:46 AM  Additional Follow-up for Phone Call Additional follow up Details #1::        added labs to order Additional Follow-up by: [place] Shannon,  February 16, 2010 1:46 PM

## 2011-01-24 NOTE — Assessment & Plan Note (Signed)
Summary: FU FROM APRIL FOR BP///KT   Vital Signs:  Patient profile:   60 year old male Weight:      247 pounds Temp:     97.8 degrees F oral Pulse rate:   62 / minute Pulse rhythm:   regular Resp:     18 per minute BP sitting:   134 / 81  (left arm) Cuff size:   large  Vitals Entered By: Levon Hedger (June 28, 2010 3:42 PM) CC: follow-up blood pressure, involuntary shaking in  the right arm, Hypertension Management Is Patient Diabetic? No Pain Assessment Patient in pain? no       Does patient need assistance? Functional Status Self care Ambulation Normal   Primary Care Provider:  Tereso Newcomer PA-C  CC:  follow-up blood pressure, involuntary shaking in  the right arm, and Hypertension Management.  History of Present Illness: Here for f/u.  Missed last appt.  On someone else's schedule today but I had a no show and he was moved to my schedule.    Hypogonadism:  For some reason, never got a referral to endocrinology.  Will recheck on that.  Vit. D:  Has started on Vitamin D replacement.    HTN:  BP stable.  States he has a h/o chest tightness.  He sees Dr. Sharyn Lull for h/o Afib and chonic amiodarone therapy.  He states he has seen Dr. Sharyn Lull for this tightness.  Thinks it is related to stress or caffeine.  If he drinks water, he has less symptoms.  No exertional chest pain.  Does have DOE related to asthma.  No radiation.    Esophageal dysmotility:  Was to see GI at [facility].  States appt had to be rescheduled but [facility] never called him back.  Will check on getting him back.  Continues to have symptoms of dysphagia and regurgitation and GERD symptoms as noted above with chest discomfort with caffeine despite being on two times a day PPI and reglan.  Patient has controlled symptoms himself with controlling portion size of meals, etc.  No hematemesis or hematochezia or melena.  Right arm: Has noted involuntary shaking in right arm for about 2 years.  Thinks it is getting worse.   Notes with doing activity with right arm.  He will have to hold his arm to make it stop.  No other shaking.  Thinks his aunt had a tremor.  Not sure about anyone else.  Asthma History    Asthma Control Assessment:    Age range: 12+ years    Symptoms: 0-2 days/week    Nighttime Awakenings: 0-2/month    Interferes w/ normal activity: no limitations    SABA use (not for EIB): >2 days/week    Asthma Control Assessment: Not Well Controlled  Hypertension History:      Positive major cardiovascular risk factors include male age 31 years old or older and hypertension.  Negative major cardiovascular risk factors include non-tobacco-user status.     Problems Prior to Update: 1)  Tremor  (ICD-781.0) 2)  Fatigue  (ICD-780.79) 3)  Vitamin D Deficiency  (ICD-268.9) 4)  Hypogonadotropic Hypogonadism  (ICD-253.4) 5)  Other Testicular Hypofunction  (ICD-257.2) 6)  Esophageal Motility Disorder  (ICD-530.5) 7)  Gastritis, Acute  (ICD-535.00) 8)  Esophagitis  (ICD-530.10) 9)  Essential Hypertension, Benign  (ICD-401.1) 10)  Erectile Dysfunction  (ICD-302.72) 11)  Folate-deficiency Anemia  (ICD-281.2) 12)  Iron Defic Anemia Sec Diet Iron Intake  (ICD-280.1) 13)  Asthma, Unspecified, Unspecified Status  (ICD-493.90)  14)  Atrial Fibrillation  (ICD-427.31) 15)  Sick Sinus Syndrome  (ICD-427.81)  Current Medications (verified): 1)  Ferrous Sulfate 325 (65 Fe) Mg Tabs (Ferrous Sulfate) .Marland Kitchen.. 1 By Mouth Three Times A Day 2)  Lisinopril 10 Mg Tabs (Lisinopril) .Marland Kitchen.. 1 By Mouth Once Daily 3)  Amiodarone Hcl 200 Mg Tabs (Amiodarone Hcl) .Marland Kitchen.. 1 By Mouth Once Daily 4)  Folic Acid 1 Mg Tabs (Folic Acid) .... Take 1 Tablet By Mouth Once A Day 5)  Reglan 10 Mg Tabs (Metoclopramide Hcl) .... Take One Tablet By Mouth Before Each Meal and At Bedtime To Help With Reflux 6)  Protonix 40 Mg  Tbec (Pantoprazole Sodium) .... Take 2 Tablets By Mouth Every 12 Hours 7)  Advair Diskus 250-50 Mcg/dose Misc  (Fluticasone-Salmeterol) .Marland Kitchen.. 1 Puff 2 Times Daily Rinse and Spit After Use 8)  Proventil Hfa 108 (90 Base) Mcg/act Aers (Albuterol Sulfate) .Marland Kitchen.. 1-2 Puffs Q 4-6 Hours As Needed 9)  Ergocalciferol 50000 Unit Caps (Ergocalciferol) .Marland Kitchen.. 1 By Mouth Once A Week For 12 Weeks  Allergies (verified): No Known Drug Allergies  Past History:  Past Medical History: Last updated: 12/13/2009 Current Problems:  IRON DEFIC ANEMIA SEC DIET IRON INTAKE (ICD-280.1)..Lab result: Hemoccults/o EGD and colonoscopy 08/2008.   a.  colo with hem's and diverticula and EGD with gastritis (d/c summ. indicates probable chronic blood loss) ASTHMA, UNSPECIFIED, UNSPECIFIED STATUS (ICD-493.90) ATRIAL FIBRILLATION (ICD-427.31)...sees Dr.Harwani SICK SINUS SYNDROME (ICD-427.81) NEEDLE PHOBIA...medical anxiety  Past Surgical History: Last updated: 09/21/2008 s/p esophageal perforation and foreign bogy 01/2001 s/p excision 11x7x11 cm abdominal wall lipoma 07/2001...[facility] 06/2002 pneumonia with right exudative pleural effusion. 2-D echo 08/2008. Myoview 08/2008...no ischemia.EF 59%  s/p colonoscopy([place])...08/2008 s/p EGD([place])...08/2008  Review of Systems       + daytime hypersomnolence + snoring history   Physical Exam  General:  alert, well-developed, and well-nourished.   Head:  normocephalic and atraumatic.   Neck:  supple and no carotid bruits.   Lungs:  normal breath sounds.   Heart:  normal rate and regular rhythm.   Abdomen:  soft, non-tender, normal bowel sounds, and no hepatomegaly.   Msk:  normal ROM.   Extremities:  no edema Neurologic:  alert & oriented X3 and cranial nerves II-XII intact.   right arm starts to shake with maintaining active abduction of right arm; resolves with assistance or rest strength in BUE is normal and equal bilat biceps, triceps and brachioradialis DTRs 2+ bilat finger to nose, rapid alt movements all normal neg rhomberg normal gait no pill rolling or  shuffling gait noted Psych:  good eye contact.     Impression & Recommendations:  Problem # 1:  VITAMIN D DEFICIENCY (ICD-268.9) due for repeat level  Orders: T-Vitamin D (25-Hydroxy) ([id number])  Problem # 2:  ASTHMA, UNSPECIFIED, UNSPECIFIED STATUS (ICD-493.90) Assessment: Improved change to Advair 500/50 to see if his symptoms can be better controlled  His updated medication list for this problem includes:    Advair Diskus 500-50 Mcg/dose Aepb (Fluticasone-salmeterol) .Marland Kitchen... 1 puff two times a day (rinse and spit after each use)    Proventil Hfa 108 (90 Base) Mcg/act Aers (Albuterol sulfate) .Marland Kitchen... 1-2 puffs q 4-6 hours as needed  Problem # 3:  ESSENTIAL HYPERTENSION, BENIGN (ICD-401.1) controlled  His updated medication list for this problem includes:    Lisinopril 10 Mg Tabs (Lisinopril) .Marland Kitchen... 1 by mouth once daily  Orders: T-Comprehensive Metabolic Panel [id number]) T-Lipid Profile ([id number])  Problem # 4:  HYPOGONADOTROPIC HYPOGONADISM (ICD-253.4) will recheck on  referral to Endocrinology  Problem # 5:  FATIGUE (ICD-780.79)  ? muscle fatigue as a cause of his arm symptoms also, notes daytime hypersomnolence will get sleep study advised him to do simple strengthening exercises at home  Orders: Split Night (Split Night)  Problem # 6:  ATRIAL FIBRILLATION (ICD-427.31)  needs labs per cardiologist CMET TSH Lipids  His updated medication list for this problem includes:    Amiodarone Hcl 200 Mg Tabs (Amiodarone hcl) .Marland Kitchen... 1 by mouth once daily  Orders: T-Comprehensive Metabolic Panel [id number]) T-Lipid Profile ([id number]) T-TSH [id number])  Problem # 7:  TREMOR (ICD-781.0) ? muscle fatigue as a cause of his arm symptoms I witnessed his tremor in the office and it was during the exam when he tried to maintain active abduction I provided rest for his arm and the shaking immediately stopped really appeared to be some type of muscle fatigue he  denies any increased activity or injury  also, notes daytime hypersomnolence will get sleep study advised him to do simple strengthening exercises at home f/u in few months  Complete Medication List: 1)  Ferrous Sulfate 325 (65 Fe) Mg Tabs (Ferrous sulfate) .Marland Kitchen.. 1 by mouth three times a day 2)  Lisinopril 10 Mg Tabs (Lisinopril) .Marland Kitchen.. 1 by mouth once daily 3)  Amiodarone Hcl 200 Mg Tabs (Amiodarone hcl) .Marland Kitchen.. 1 by mouth once daily 4)  Folic Acid 1 Mg Tabs (Folic acid) .... Take 1 tablet by mouth once a day 5)  Reglan 10 Mg Tabs (Metoclopramide hcl) .... Take one tablet by mouth before each meal and at bedtime to help with reflux 6)  Protonix 40 Mg Tbec (Pantoprazole sodium) .... Take 2 tablets by mouth every 12 hours 7)  Advair Diskus 500-50 Mcg/dose Aepb (Fluticasone-salmeterol) .Marland Kitchen.. 1 puff two times a day (rinse and spit after each use) 8)  Proventil Hfa 108 (90 Base) Mcg/act Aers (Albuterol sulfate) .Marland Kitchen.. 1-2 puffs q 4-6 hours as needed 9)  Ergocalciferol 50000 Unit Caps (Ergocalciferol) .Marland Kitchen.. 1 by mouth once a week for 12 weeks  Other Orders: T-CBC w/Diff ([id number])  Hypertension Assessment/Plan:      The patient's hypertensive risk group is category B: At least one risk factor (excluding diabetes) with no target organ damage.  His calculated 10 year risk of coronary heart disease is 7 %.  Today's blood pressure is 134/81.  His blood pressure goal is < 140/90.   Patient Instructions: 1)  Please schedule a follow-up appointment in 3 months with Darah Simkin for blood pressure, asthma and follow up on right arm.  2)  I have given you a new prescription for a higher dose of Advair.  Get it filled when you run out of your current inhaler. 3)  Do the exercises I showed you 3-4 times a week.  Do 2-3 sets of about 10 repetitions each time. 4)  Someone should call you about a referral to the gastroenterologist at [facility] and the Endocrinologist in [place].  If you do not hear anything in 2  weeks, call us. Prescriptions: ADVAIR DISKUS 500-50 MCG/DOSE AEPB (FLUTICASONE-SALMETEROL) 1 puff two times a day (rinse and spit after each use)  #1 x 5   Entered and Authorized by:   Tereso Newcomer PA-C   Signed by:   Tereso Newcomer PA-C on 06/28/2010   Method used:   Print then Give to Patient   RxID:   [id number]

## 2011-01-24 NOTE — Letter (Signed)
Summary: *HSN Results Follow up  [facility]  [address] [place], [place] [address]   Phone: [phone]  Fax: [phone]      09/02/2010   [facility] [address] [place], [place]  [address]   Dear  [name],                            ____S.Drinkard,FNP   ____D. Gore,FNP       ____B. McPherson,MD   ____V. Rankins,MD    ____E. Mulberry,MD    ____N. Daphine Deutscher, FNP  ____D. Reche Dixon, MD    ____K. Philipp Deputy, MD    __x__S. Alben Spittle, PA-C     This letter is to inform you that your recent test(s):  _______Pap Smear    ___x____Lab Test     _______X-ray    ___x____ is within acceptable limits  _______ requires a medication change  _______ requires a follow-up lab visit  _______ requires a follow-up visit with your provider   Comments: Blood counts are stable.  Please follow up with gastroenterology at [facility]       _________________________________________________________ If you have any questions, please contact our office                     Sincerely,  Tereso Newcomer PA-C [facility]

## 2011-01-24 NOTE — Progress Notes (Signed)
  Phone Note Outgoing Call   Summary of Call: I spoke to Dr. Everardo All at [facility] (endocrinologist). He recommended that the patient come see him for his low testosterone. Referral in system. Please notify patient. I have not sent to [facility]. I can talk to him if needed.  Initial call taken by: Tereso Newcomer PA-C,  March 25, 2010 3:45 PM  Follow-up for Phone Call        pt aware of referral....  Follow-up by: [place] Shannon,  March 29, 2010 12:22 PM       Impression & Recommendations:  Problem # 1:  HYPOGONADOTROPIC HYPOGONADISM (ICD-253.4)  Orders: Endocrinology Referral (Endocrine)  Complete Medication List: 1)  Ferrous Sulfate 325 (65 Fe) Mg Tabs (Ferrous sulfate) .Marland Kitchen.. 1 by mouth three times a day 2)  Lisinopril 10 Mg Tabs (Lisinopril) .Marland Kitchen.. 1 by mouth once daily 3)  Amiodarone Hcl 200 Mg Tabs (Amiodarone hcl) .Marland Kitchen.. 1 by mouth once daily 4)  Folic Acid 1 Mg Tabs (Folic acid) .... Take 1 tablet by mouth once a day 5)  Reglan 10 Mg Tabs (Metoclopramide hcl) .... Take one tablet by mouth before each meal and at bedtime to help with reflux 6)  Protonix 40 Mg Tbec (Pantoprazole sodium) .... Take 2 tablets by mouth every 12 hours 7)  Advair Diskus 250-50 Mcg/dose Misc (Fluticasone-salmeterol) .Marland Kitchen.. 1 puff 2 times daily rinse and spit after use 8)  Proventil Hfa 108 (90 Base) Mcg/act Aers (Albuterol sulfate) .Marland Kitchen.. 1-2 puffs q 4-6 hours as needed 9)  Ergocalciferol 50000 Unit Caps (Ergocalciferol) .Marland Kitchen.. 1 by mouth once a week for 12 weeks

## 2011-01-24 NOTE — Assessment & Plan Note (Signed)
Summary: F/u on Groin pain   Vital Signs:  Patient profile:   60 year old male Weight:      246.6 pounds BMI:     39.95 O2 Sat:      100 % on Room air Temp:     97.4 degrees F oral Pulse rate:   66 / minute Pulse rhythm:   regular Resp:     20 per minute BP sitting:   120 / 70  (left arm) Cuff size:   large  Vitals Entered By: Levon Hedger (October 20, 2010 11:49 AM)  Nutrition Counseling: Patient's BMI is greater than 25 and therefore counseled on weight management options.  O2 Flow:  Room air  Serial Vital Signs/Assessments:  Comments: 1:00 PM peak flow - 200, 200, 200 done by Steward [employer] By: Lehman Prom FNP   CC: follow up visit....had a black out spell on Tuesday and was disoriented that has never happened to him before this has him concerned, Hypertension Management Is Patient Diabetic? No Pain Assessment Patient in pain? no       Does patient need assistance? Functional Status Self care Ambulation Normal   Primary Care Provider:  Tereso Newcomer PA-C  CC:  follow up visit....had a black out spell on Tuesday and was disoriented that has never happened to him before this has him concerned and Hypertension Management.  History of Present Illness:  Pt into the office for f/u on groin pain as per Tereso Newcomer. Pt reports that pain has resolved at this time  Syncopal episode earlier this week He was in the kitchen making a salad and then the next thing he new he was regaining consciousness on the floor.  Pt lives alone. No witnesses No loss of bowel or bladder Pt did not go to the hospital He admits that he was some disoriented some following the event he went to bed and woke the next day still feeling "rough" but had to move on Pt has a cardiologist - Dr. Marlis Edelson (sees every other month)  Pt did not bring his medications into the office today during visit - pt was advised to bring all meds  Sleep Apnea - pt does have a CPAP machine and admits that  he does not wear it consecutively. He was not wearing it on the night before his syncopal episode  Asthma History    Asthma Control Assessment:    Age range: 12+ years    Symptoms: 0-2 days/week    Nighttime Awakenings: 1-3/week    Interferes w/ normal activity: no limitations    SABA use (not for EIB): >2 days/week    Exacerbations requiring oral systemic steroids: 0-1/year    Asthma Control Assessment: Not Well Controlled  Hypertension History:      He denies headache, chest pain, and palpitations.  He notes no problems with any antihypertensive medication side effects.        Positive major cardiovascular risk factors include male age 36 years old or older and hypertension.  Negative major cardiovascular risk factors include no history of diabetes or hyperlipidemia and non-tobacco-user status.      Habits & Providers  Alcohol-Tobacco-Diet     Alcohol drinks/day: 0     Tobacco Status: never  Exercise-Depression-Behavior     Does Patient Exercise: no     Have you felt down or hopeless? yes     Have you felt little pleasure in things? yes     Depression Counseling: not indicated; screening negative for depression  Drug Use: no  Allergies (verified): No Known Drug Allergies  Social History: Does Patient Exercise:  no  Review of Systems General:  + syncopal episode earlier this week. CV:  Denies chest pain or discomfort. Resp:  Denies cough. GI:  Denies abdominal pain, nausea, and vomiting.  Physical Exam  General:  alert.   Head:  normocephalic.   Eyes:  pupils round.   Lungs:  expiratory wheezes Heart:  normal rate and regular rhythm.   Abdomen:  obese Msk:  up to the exam Neurologic:  alert & oriented X3.   Psych:  Oriented X3.     Impression & Recommendations:  Problem # 1:  ESSENTIAL HYPERTENSION, BENIGN (ICD-401.1) BP doing well today Continue current meds His updated medication list for this problem includes:    Cozaar 50 Mg Tabs (Losartan  potassium) .Marland Kitchen... Take 1 tablet by mouth once a day for blood pressure (pharmacy: lisinopril is d/c'd)  Problem # 2:  ASTHMA, UNSPECIFIED, UNSPECIFIED STATUS (ICD-493.90) pt to continue current meds some wheezes today on exam asthma action plan given to pt His updated medication list for this problem includes:    Advair Diskus 500-50 Mcg/dose Aepb (Fluticasone-salmeterol) .Marland Kitchen... 1 puff two times a day (rinse and spit after each use)    Proventil Hfa 108 (90 Base) Mcg/act Aers (Albuterol sulfate) .Marland Kitchen... 1-2 puffs q 4-6 hours as needed  Orders: Pulse Oximetry (single measurment) (94760) Peak Flow Rate (94150)  Problem # 3:  SICK SINUS SYNDROME (ICD-427.81) pt has a cardiology  advised him to call there regarding his syncopal episode His updated medication list for this problem includes:    Amiodarone Hcl 200 Mg Tabs (Amiodarone hcl) .Marland Kitchen... 1 by mouth once daily  Problem # 4:  GERD (ICD-530.81) Symptoms stable His updated medication list for this problem includes:    Protonix 40 Mg Tbec (Pantoprazole sodium) .Marland Kitchen... Take 1 tablet by mouth two times a day  Complete Medication List: 1)  Ferrous Sulfate 325 (65 Fe) Mg Tabs (Ferrous sulfate) .Marland Kitchen.. 1 by mouth three times a day 2)  Cozaar 50 Mg Tabs (Losartan potassium) .... Take 1 tablet by mouth once a day for blood pressure (pharmacy: lisinopril is d/c'd) 3)  Amiodarone Hcl 200 Mg Tabs (Amiodarone hcl) .Marland Kitchen.. 1 by mouth once daily 4)  Folic Acid 1 Mg Tabs (Folic acid) .... Take 1 tablet by mouth once a day 5)  Reglan 10 Mg Tabs (Metoclopramide hcl) .... Take one tablet by mouth before each meal and at bedtime to help with reflux 6)  Protonix 40 Mg Tbec (Pantoprazole sodium) .... Take 1 tablet by mouth two times a day 7)  Advair Diskus 500-50 Mcg/dose Aepb (Fluticasone-salmeterol) .Marland Kitchen.. 1 puff two times a day (rinse and spit after each use) 8)  Proventil Hfa 108 (90 Base) Mcg/act Aers (Albuterol sulfate) .Marland Kitchen.. 1-2 puffs q 4-6 hours as needed 9)   Ergocalciferol 50000 Unit Caps (Ergocalciferol) .Marland Kitchen.. 1 by mouth once a week for 12 weeks 10)  Claritin 10 Mg Tabs (Loratadine) .... Take 1 tablet by mouth once a day 11)  Ketoconazole 2 % Crea (Ketoconazole) .... Apply to groin rash two times a day until gone; continue one week after resolution  Asthma Management Plan    Asthma Severity: Moderate Persistent    Control Assessment: Not Well Controlled    Personal best PEF: 200 liters/minute    Predicted PEF: 569 liters/minute    Working PEF: 569 liters/minute    Plan based on PEF formula: Nunn and  Dinah Beers Zone: (Range: 460 to 570) ADVAIR DISKUS 500-50 MCG/DOSE AEPB:  2 puffs every 12 hours  Yellow Zone: PROVENTIL HFA 108 (90 BASE) MCG/ACT AERS:  4 puffs every 4-6 hours  Red Zone: Call your physician for shortness of breath.    Hypertension Assessment/Plan:      The patient's hypertensive risk group is category B: At least one risk factor (excluding diabetes) with no target organ damage.  His calculated 10 year risk of coronary heart disease is 11 %.  Today's blood pressure is 120/70.  His blood pressure goal is < 140/90.   Patient Instructions: 1)  Call cardiology and inform them of your recent episode. 2)  This is concerning especially with your history. 3)  Blood pressure is ok today. 4)  Pulse is 66 5)  Asthma - some wheezes today on exam 6)  Use advair two times a day (rinse mouth after use) 7)  Also use your albuterol inhaler as needed  8)  You have declined the flu vaccine today. If you change your mind then schedule a  nurse visit in this office.  Strongly encourage you to get the flu vaccine   Orders Added: 1)  Est. Patient Level III [35009] 2)  Pulse Oximetry (single measurment) [94760] 3)  Peak Flow Rate [94150]

## 2011-01-24 NOTE — Progress Notes (Signed)
Summary: Endocrinology Referral & GI referral  Phone Note Outgoing Call   Summary of Call: 1. Seeing patient today.  He never got a referral to endocrinology.  Can we check into this?  Phone note and referral done in April.  Changed date of referral to today.  Please refer to endocrinology for hypogonadism.  2. Was supposed to see [facility] few months ago for follow up on esophageal dysmotility.  He did have an appt but had to be rescheduled.  He never heard back from [facility].  Please provide him with info. to reschedule or go ahead and reschedule. Initial call taken by: Tereso Newcomer PA-C,  June 28, 2010 4:33 PM  Follow-up for Phone Call        Dr.Ellison does not accept the [facility] card.The Pt would need [phone] as a new Pt.Lt messsage for pt to call back.Will also give # to [facility] to R/S appt according to his availability.Pt would like for me to send Endocrinology referral to [facility]. Follow-up by: Candi Leash,  June 30, 2010 3:53 PM  Additional Follow-up for Phone Call Additional follow up Details #1::        I was not aware of that.  I have spoken to him a few times about different patients and he always says he would be happy to see them.  Go ahead and send referral to [facility]. Thank you.  Additional Follow-up by: Tereso Newcomer PA-C,  June 30, 2010 4:59 PM    Additional Follow-up for Phone Call Additional follow up Details #2::    Well that statement regarding the orange card came from the scheduling staff.I will send referral to [facility] Follow-up by: Candi Leash,  July 01, 2010 8:17 AM  Additional Follow-up for Phone Call Additional follow up Details #3:: Details for Additional Follow-up Action Taken: Thanks. Tereso Newcomer PA-C  July 01, 2010 2:59 PM

## 2011-01-24 NOTE — Progress Notes (Signed)
  Phone Note Outgoing Call   Summary of Call: Fax copy of labs to Dr. Sharyn Lull when they come back. Initial call taken by: Brynda Rim,  January 21, 2010 2:32 PM  Follow-up for Phone Call        ok Follow-up by: Armenia Shannon,  February 14, 2010 11:42 AM

## 2011-01-24 NOTE — Letter (Signed)
Summary: *Referral Letter  [facility]  [address] [place], [place] [address]   Phone: [phone]  Fax: [phone]    08/05/2010  Thank you in advance for agreeing to see my patient:  [name] [name] [address] [place], [place]  [address]  Phone: [phone]  Reason for Referral: 60 yo male with chronic microcytic anemia and gastric dysmotility.  He had a colonoscopy in 2009 for heme positive stools.  This revealed hemorrhoids and diverticula.  He also had an EGD at the same time that demonstrated esophagitis, hiatal hernia and gastritis.  He had a hemoglobin electrophoresis this year that demonstrated normal hemoglobins.  His hemoglobin has remained stable (approx 10.5) over the last 12 months.  Please evaluate.  Procedures Requested:   Current Medical Problems: 1)  TREMOR (ICD-781.0) 2)  FATIGUE (ICD-780.79) 3)  VITAMIN D DEFICIENCY (ICD-268.9) 4)  HYPOGONADOTROPIC HYPOGONADISM (ICD-253.4) 5)  OTHER TESTICULAR HYPOFUNCTION (ICD-257.2) 6)  ESOPHAGEAL MOTILITY DISORDER (ICD-530.5) 7)  GASTRITIS, ACUTE (ICD-535.00) 8)  ESOPHAGITIS (ICD-530.10) 9)  ESSENTIAL HYPERTENSION, BENIGN (ICD-401.1) 10)  ERECTILE DYSFUNCTION (ICD-302.72) 11)  FOLATE-DEFICIENCY ANEMIA (ICD-281.2) 12)  IRON DEFIC ANEMIA SEC DIET IRON INTAKE (ICD-280.1) 13)  ASTHMA, UNSPECIFIED, UNSPECIFIED STATUS (ICD-493.90) 14)  ATRIAL FIBRILLATION (ICD-427.31) 15)  SICK SINUS SYNDROME (ICD-427.81)   Current Medications: 1)  FERROUS SULFATE 325 (65 FE) MG TABS (FERROUS SULFATE) 1 by mouth three times a day 2)  LISINOPRIL 10 MG TABS (LISINOPRIL) 1 by mouth once daily 3)  AMIODARONE HCL 200 MG TABS (AMIODARONE HCL) 1 by mouth once daily 4)  FOLIC ACID 1 MG TABS (FOLIC ACID) Take 1 tablet by mouth once a day 5)  REGLAN 10 MG TABS (METOCLOPRAMIDE HCL) Take one tablet by mouth before each meal and at bedtime to help with reflux 6)  PROTONIX 40 MG  TBEC (PANTOPRAZOLE SODIUM) Take 2 tablets by mouth  every 12 hours 7)  ADVAIR DISKUS 500-50 MCG/DOSE AEPB (FLUTICASONE-SALMETEROL) 1 puff two times a day (rinse and spit after each use) 8)  PROVENTIL HFA 108 (90 BASE) MCG/ACT AERS (ALBUTEROL SULFATE) 1-2 puffs q 4-6 hours as needed 9)  ERGOCALCIFEROL 50000 UNIT CAPS (ERGOCALCIFEROL) 1 by mouth once a week for 12 weeks   Past Medical History: 1)  Current Problems:  2)  IRON DEFIC ANEMIA SEC DIET IRON INTAKE (ICD-280.1)..Lab result: Hemoccults/o EGD and colonoscopy 08/2008. 3)    a.  colo with hem's and diverticula and EGD with gastritis (d/c summ. indicates probable chronic blood loss) 4)  ASTHMA, UNSPECIFIED, UNSPECIFIED STATUS (ICD-493.90) 5)  ATRIAL FIBRILLATION (ICD-427.31)...sees Dr.Harwani 6)  SICK SINUS SYNDROME (ICD-427.81) 7)  NEEDLE PHOBIA...medical anxiety   Prior History of Blood Transfusions:   Pertinent Labs:    Thank you again for agreeing to see our patient; please contact us if you have any further questions or need additional information.  Sincerely,  Tereso Newcomer PA-C

## 2011-01-24 NOTE — Assessment & Plan Note (Signed)
Summary: Tinea; HTN; Asthma; GERD   Vital Signs:  Patient profile:   60 year old [name] Height:      66 inches Weight:      246 pounds BMI:     39.85 Temp:     98.1 degrees F oral Pulse rate:   82 / minute Pulse rhythm:   regular Resp:     18 per minute BP sitting:   138 / 92  (left arm) Cuff size:   large  Vitals Entered By: CMA Student Kenyatta Jefries CC: f/u astma and BP, Hypertension Management Is Patient Diabetic? No Pain Assessment Patient in pain? no       Does patient need assistance? Functional Status Self care Ambulation Normal Comments Peak Flow results: 1.  155    2.  170      3.  170   Primary Care Provider:  Tereso Newcomer PA-C  CC:  f/u astma and BP and Hypertension Management.  History of Present Illness: Here for f/u. At last visit changed ACE to ARB, increased PPI and increased Advair. He has less chest discomfort.  He notes mainly with meals.  No dysphagia.  No melena or hematochezia.  He still has not gotten an appt with GI.  I will check on this again. Breathing is better.  He is using proventil less.   His main c/o today is a rash in his groin.  He used some cream a couple years ago with resolution.  Hosp records indicate he was given ketoconazole at that time.  He notes rash for 2 months.  It was pruritic at first.  He has tried his own home remedy. It has gotten worse . . . macerated.  Now it is painful.  No fevers.  Asthma History    Asthma Control Assessment:    Age range: 12+ years    Symptoms: 0-2 days/week    Nighttime Awakenings: 0-2/month    SABA use (not for EIB): 0-2 days/week    Asthma Control Assessment: Well Controlled  Hypertension History:      Positive major cardiovascular risk factors include [name] age 60 years old or older and hypertension.  Negative major cardiovascular risk factors include non-tobacco-user status.      Problems Prior to Update: 1)  Tinea Cruris  (ICD-110.3) 2)  Dizziness  (ICD-780.4) 3)  Gerd   (ICD-530.81) 4)  Chest Pain Unspecified  (ICD-786.50) 5)  Sleep Apnea  (ICD-780.57) 6)  Tremor  (ICD-781.0) 7)  Fatigue  (ICD-780.79) 8)  Vitamin D Deficiency  (ICD-268.9) 9)  Hypogonadotropic Hypogonadism  (ICD-253.4) 10)  Other Testicular Hypofunction  (ICD-257.2) [name])  Esophageal Motility Disorder  (ICD-530.5) 12)  Gastritis, Acute  (ICD-535.00) 13)  Esophagitis  (ICD-530.10) 14)  Essential Hypertension, Benign  (ICD-401.1) 15)  Erectile Dysfunction  (ICD-302.72) 16)  Folate-deficiency Anemia  (ICD-281.2) 17)  Iron Defic Anemia Sec Diet Iron Intake  (ICD-280.1) 18)  Asthma, Unspecified, Unspecified Status  (ICD-493.90) 19)  Atrial Fibrillation  (ICD-427.31) 20)  Sick Sinus Syndrome  (ICD-427.81)  Current Medications (verified): 1)  Ferrous Sulfate 325 (65 Fe) Mg Tabs (Ferrous Sulfate) .Marland Kitchen.. 1 By Mouth Three Times A Day 2)  Cozaar 50 Mg Tabs (Losartan Potassium) .... Take 1 Tablet By Mouth Once A Day For Blood Pressure (Pharmacy: Lisinopril Is D/c'd) 3)  Amiodarone Hcl 200 Mg Tabs (Amiodarone Hcl) .Marland Kitchen.. 1 By Mouth Once Daily 4)  Folic Acid 1 Mg Tabs (Folic Acid) .... Take 1 Tablet By Mouth Once A Day 5)  Reglan  10 Mg Tabs (Metoclopramide Hcl) .... Take One Tablet By Mouth Before Each Meal and At Bedtime To Help With Reflux 6)  Protonix 40 Mg  Tbec (Pantoprazole Sodium) .... Take 1 Tablet By Mouth Two Times A Day 7)  Advair Diskus 500-50 Mcg/dose Aepb (Fluticasone-Salmeterol) .Marland Kitchen.. 1 Puff Two Times A Day (Rinse and Spit After Each Use) 8)  Proventil Hfa 108 (90 Base) Mcg/act Aers (Albuterol Sulfate) .Marland Kitchen.. 1-2 Puffs Q 4-6 Hours As Needed 9)  Ergocalciferol 50000 Unit Caps (Ergocalciferol) .Marland Kitchen.. 1 By Mouth Once A Week For 12 Weeks 10)  Claritin 10 Mg Tabs (Loratadine) .... Take 1 Tablet By Mouth Once A Day  Allergies (verified): No Known Drug Allergies  Past History:  Past Medical History: Last updated: 09/01/2010 Current Problems:  IRON DEFIC ANEMIA SEC DIET IRON INTAKE  (ICD-280.1)..Lab result: Hemoccults/o EGD and colonoscopy 08/2008.   a.  colo with hem's and diverticula and EGD with gastritis (d/c summ. indicates probable chronic blood loss) ASTHMA, UNSPECIFIED, UNSPECIFIED STATUS (ICD-493.90) ATRIAL FIBRILLATION (ICD-427.31)...sees Dr.Harwani SICK SINUS SYNDROME (ICD-427.81) NEEDLE PHOBIA...medical anxiety chest pain admx 8.2011   a.  myoview neg for ischemia; EF 51 % Sleep Apnea   a.  sleep study 8.5.2011:  Mod OSA; AHI 18/hr.; O2 desat to nadir of 91% on RA  Past Surgical History: Last updated: 09/21/2008 s/p esophageal perforation and foreign bogy 01/2001 s/p excision [id number] cm abdominal wall lipoma 07/2001...[facility] Hospitalized 06/2002 pneumonia with right exudative pleural effusion. 2-D echo 08/2008. Myoview 08/2008...no ischemia.EF 59%  s/p colonoscopy([place])...08/2008 s/p EGD([place])...08/2008  Physical Exam  General:  alert, well-developed, and well-nourished.   Head:  normocephalic and atraumatic.   Neck:  supple.   Lungs:  normal breath sounds and no wheezes.   Heart:  normal rate and regular rhythm.   Abdomen:  soft and non-tender.   Neurologic:  alert & oriented X3 and cranial nerves II-XII intact.   Skin:  widespread plaque in groin macerated with open wounds no purulent d/c  Psych:  normally interactive.     Impression & Recommendations:  Problem # 1:  ASTHMA, UNSPECIFIED, UNSPECIFIED STATUS (ICD-493.90) Assessment Improved cont current meds hold off on PFTs for now defer timing of PFTs to cardiologist with [facility]  His updated medication list for this problem includes:    Advair Diskus 500-50 Mcg/dose Aepb (Fluticasone-salmeterol) .Marland Kitchen... 1 puff two times a day (rinse and spit after each use)    Proventil Hfa 108 (90 Base) Mcg/act Aers (Albuterol sulfate) .Marland Kitchen... 1-2 puffs q 4-6 hours as needed  Problem # 2:  ESSENTIAL HYPERTENSION, BENIGN (ICD-401.1) stable continue current meds if BP remains >140/90, increase dose of  cozaar or change to hyzaar  His updated medication list for this problem includes:    Cozaar 50 Mg Tabs (Losartan potassium) .Marland Kitchen... Take 1 tablet by mouth once a day for blood pressure (pharmacy: lisinopril is d/c'd)  Orders: T-Basic Metabolic Panel [phone])  Problem # 3:  GERD (ICD-530.81) needs f/u with GI will check on  His updated medication list for this problem includes:    Protonix 40 Mg Tbec (Pantoprazole sodium) .Marland Kitchen... Take 1 tablet by mouth two times a day  Problem # 4:  ESOPHAGEAL MOTILITY DISORDER (ICD-530.5) f/u with GI  Problem # 5:  CHEST PAIN UNSPECIFIED (ICD-786.50) suspect GI etiology as above  Problem # 6:  TINEA CRURIS (ICD-110.3) Assessment: New large area of involvement with macerated skin may be developing secondary infxn  will give oral antibx's  ketoconazole cream two times a day needs  close f/u in 2 weeks ? need oral antifungal if not improving  Problem # 7:  VITAMIN D DEFICIENCY (ICD-268.9)  Orders: T-Vitamin D (25-Hydroxy) ([id number])  Complete Medication List: 1)  Ferrous Sulfate 325 (65 Fe) Mg Tabs (Ferrous sulfate) .Marland Kitchen.. 1 by mouth three times a day 2)  Cozaar 50 Mg Tabs (Losartan potassium) .... Take 1 tablet by mouth once a day for blood pressure (pharmacy: lisinopril is d/c'd) 3)  Amiodarone Hcl 200 Mg Tabs (Amiodarone hcl) .Marland Kitchen.. 1 by mouth once daily 4)  Folic Acid 1 Mg Tabs (Folic acid) .... Take 1 tablet by mouth once a day 5)  Reglan 10 Mg Tabs (Metoclopramide hcl) .... Take one tablet by mouth before each meal and at bedtime to help with reflux 6)  Protonix 40 Mg Tbec (Pantoprazole sodium) .... Take 1 tablet by mouth two times a day 7)  Advair Diskus 500-50 Mcg/dose Aepb (Fluticasone-salmeterol) .Marland Kitchen.. 1 puff two times a day (rinse and spit after each use) 8)  Proventil Hfa 108 (90 Base) Mcg/act Aers (Albuterol sulfate) .Marland Kitchen.. 1-2 puffs q 4-6 hours as needed 9)  Ergocalciferol 50000 Unit Caps (Ergocalciferol) .Marland Kitchen.. 1 by mouth once a  week for 12 weeks 10)  Claritin 10 Mg Tabs (Loratadine) .... Take 1 tablet by mouth once a day [name])  Ketoconazole 2 % Crea (Ketoconazole) .... Apply to groin rash two times a day until gone; continue one week after resolution 12)  Keflex 500 Mg Caps (Cephalexin) .... Take 1 tablet by mouth three times a day  Hypertension Assessment/Plan:      The patient's hypertensive risk group is category B: At least one risk factor (excluding diabetes) with no target organ damage.  His calculated 10 year risk of coronary heart disease is 18 %.  Today's blood pressure is 138/92.  His blood pressure goal is < 140/90.  Patient Instructions: 1)  Apply cream to groin two times a day. 2)  Take Keflex until all gone. 3)  Schedule follow up in 2 weeks to recheck groin. 4)  Return sooner if it is getting worse. 5)  We will check on your referral to [facility] for gastroenterology. 6)  Schedule follow up for blood pressure and asthma in 3 months. Prescriptions: KEFLEX 500 MG CAPS (CEPHALEXIN) Take 1 tablet by mouth three times a day  #30 x 0   Entered and Authorized by:   Tereso Newcomer PA-C   Signed by:   Tereso Newcomer PA-C on 09/30/2010   Method used:   Print then Give to Patient   RxID:   [id number] KETOCONAZOLE 2 % CREA (KETOCONAZOLE) apply to groin rash two times a day until gone; continue one week after resolution  #45 grams x 1   Entered and Authorized by:   Tereso Newcomer PA-C   Signed by:   Tereso Newcomer PA-C on 09/30/2010   Method used:   Print then Give to Patient   RxID:   [id number]

## 2011-01-24 NOTE — Progress Notes (Signed)
Summary: Sleep Apnea  Phone Note Outgoing Call   Summary of Call: Sleep study reviewed. He has moderate sleep apnea. He needs referral for Home Health to get CPAP arranged. Please send to Arna Medici after patient notified.  Initial call taken by: Brynda Rim,  August 20, 2010 5:13 PM  Follow-up for Phone Call        advised pt of results and we will check into their indigent program to see if he qualifies and will contact pt when referral is complete.  forward to Arna Medici for completion. Follow-up by: Hassell Halim CMA,  August 22, 2010 3:33 PM  Additional Follow-up for Phone Call Additional follow up Details #1::        I talk to pt and I send the Financial Eligibility by mail . I send the referral today to [facility] Additional Follow-up by: Cheryll Dessert,  August 26, 2010 1:19 PM  New Problems: SLEEP APNEA (ICD-780.57)   New Problems: SLEEP APNEA (ICD-780.57)    Impression & Recommendations:  Problem # 1:  SLEEP APNEA (ICD-780.57)  Orders: Home Health Referral (Home Health)  Complete Medication List: 1)  Ferrous Sulfate 325 (65 Fe) Mg Tabs (Ferrous sulfate) .Marland Kitchen.. 1 by mouth three times a day 2)  Lisinopril 10 Mg Tabs (Lisinopril) .Marland Kitchen.. 1 by mouth once daily 3)  Amiodarone Hcl 200 Mg Tabs (Amiodarone hcl) .Marland Kitchen.. 1 by mouth once daily 4)  Folic Acid 1 Mg Tabs (Folic acid) .... Take 1 tablet by mouth once a day 5)  Reglan 10 Mg Tabs (Metoclopramide hcl) .... Take one tablet by mouth before each meal and at bedtime to help with reflux 6)  Protonix 40 Mg Tbec (Pantoprazole sodium) .... Take 2 tablets by mouth every 12 hours 7)  Advair Diskus 500-50 Mcg/dose Aepb (Fluticasone-salmeterol) .Marland Kitchen.. 1 puff two times a day (rinse and spit after each use) 8)  Proventil Hfa 108 (90 Base) Mcg/act Aers (Albuterol sulfate) .Marland Kitchen.. 1-2 puffs q 4-6 hours as needed 9)  Ergocalciferol 50000 Unit Caps (Ergocalciferol) .Marland Kitchen.. 1 by mouth once a week for 12 weeks   Past History:  Past  Medical History: Current Problems:  IRON DEFIC ANEMIA SEC DIET IRON INTAKE (ICD-280.1)..Lab result: Hemoccults/o EGD and colonoscopy 08/2008.   a.  colo with hem's and diverticula and EGD with gastritis (d/c summ. indicates probable chronic blood loss) ASTHMA, UNSPECIFIED, UNSPECIFIED STATUS (ICD-493.90) ATRIAL FIBRILLATION (ICD-427.31)...sees Dr.Harwani SICK SINUS SYNDROME (ICD-427.81) NEEDLE PHOBIA...medical anxiety chest pain admx 8.2011   a.  myoview neg for ischemia Sleep Apnea   a.  sleep study 8.5.2011:  Mod OSA; AHI 18/hr.; O2 desat to nadir of 91% on RA

## 2011-01-24 NOTE — Progress Notes (Signed)
  Phone Note Outgoing Call   Summary of Call: Labs drawn on 3/14.  I have not rec'd anything back yet. Can you check on the status? Initial call taken by: Brynda Rim,  March 12, 2010 2:54 PM  Follow-up for Phone Call        They were not actually drawn, could not get blood, pt to come back today for another attempt. Follow-up by: Vesta Mixer CMA,  March 14, 2010 11:46 AM

## 2011-01-24 NOTE — Letter (Signed)
Summary: Discharge Summary  Discharge Summary   Imported By: Arta Bruce 09/02/2010 14:46:40  _____________________________________________________________________  External Attachment:    Type:   Image     Comment:   External Document

## 2011-01-24 NOTE — Progress Notes (Signed)
  Phone Note Outgoing Call   Summary of Call: Pt is vit. D deficient I was going to talk to him about that today but he was not here. Start replacement and recheck in 3 mos. Rx in basket Initial call taken by: Brynda Rim,  March 25, 2010 3:24 PM  Follow-up for Phone Call        pt is aware and will pick up rx... Arm[place] Shannon  March 29, 2010 12:22 PM   New Problems: VITAMIN D DEFICIENCY (ICD-268.9)   New Problems: VITAMIN D DEFICIENCY (ICD-268.9) New/Updated Medications: ERGOCALCIFEROL 50000 UNIT CAPS (ERGOCALCIFEROL) 1 by mouth once a week for 12 weeks Prescriptions: ERGOCALCIFEROL 50000 UNIT CAPS (ERGOCALCIFEROL) 1 by mouth once a week for 12 weeks  #12 x 0   Entered and Authorized by:   Tereso Newcomer PA-C   Signed by:   Tereso Newcomer PA-C on 03/25/2010   Method used:   Printed then faxed to ...         RxID:   [id number]     Impression & Recommendations:  Problem # 1:  VITAMIN D DEFICIENCY (ICD-268.9)  Complete Medication List: 1)  Ferrous Sulfate 325 (65 Fe) Mg Tabs (Ferrous sulfate) .Marland Kitchen.. 1 by mouth three times a day 2)  Lisinopril 10 Mg Tabs (Lisinopril) .Marland Kitchen.. 1 by mouth once daily 3)  Amiodarone Hcl 200 Mg Tabs (Amiodarone hcl) .Marland Kitchen.. 1 by mouth once daily 4)  Folic Acid 1 Mg Tabs (Folic acid) .... Take 1 tablet by mouth once a day 5)  Reglan 10 Mg Tabs (Metoclopramide hcl) .... Take one tablet by mouth before each meal and at bedtime to help with reflux 6)  Protonix 40 Mg Tbec (Pantoprazole sodium) .... Take 2 tablets by mouth every 12 hours 7)  Advair Diskus 250-50 Mcg/dose Misc (Fluticasone-salmeterol) .Marland Kitchen.. 1 puff 2 times daily rinse and spit after use 8)  Proventil Hfa 108 (90 Base) Mcg/act Aers (Albuterol sulfate) .Marland Kitchen.. 1-2 puffs q 4-6 hours as needed 9)  Ergocalciferol 50000 Unit Caps (Ergocalciferol) .Marland Kitchen.. 1 by mouth once a week for 12 weeks

## 2011-01-24 NOTE — Assessment & Plan Note (Signed)
Summary: HFU /DYZINESS & RESPIRATORY PROBLEMS /NS   Vital Signs:  Patient profile:   60 year old male Height:      66 inches Weight:      245 pounds BMI:     39.69 Temp:     98.1 degrees F oral Pulse rate:   84 / minute Pulse (ortho):   94 / minute Pulse rhythm:   regular Resp:     18 per minute BP sitting:   152 / 96  (left arm) Cuff size:   regular  Vitals Entered By: CMA Student Linzie Collin CC: F/U for Resp. issues, Patient states still having tightness in chest. Patient is currently feeling light headed. Medications verified. Is Patient Diabetic? No Pain Assessment Patient in pain? no       Does patient need assistance? Functional Status Self care Ambulation Normal Comments Peak Flow results:  1.  210     2.  220     3.  200   Primary Care Provider:  Tereso Newcomer PA-C  CC:  F/U for Resp. issues and Patient states still having tightness in chest. Patient is currently feeling light headed. Medications verified.Marland Kitchen  History of Present Illness: Here for f/u on recent admxn to the hospital. He was admx for chest pain 08/03/2010.  MI was r/o.  Myoview was neg for ischemia.  He had sinus brady noted on EKG and his Amio dose was decreased to 100 mg once daily. States that he awoke on the morning of admxn with a spinning sensation.   Seemed to get worse with changing position.  He notes the spinning started again when he tried to lay back down.  He did have chest tightness.  He was short of breath.  He did f/u with Dr. Sharyn Lull after discharge.   No further spinning.  Never told he has vertigo. No syncope.  No near syncope.  Felt lightheaded this morning.  Stopped driving.  No near syncope.   Noted chest tightness when he came in the office today.  He notes that drinking caffeine makes his tightness worse.  No radiation.  Does have assoc dyspnea.  No nausea or diaphoresis.  He denies coughing.  Does note wheezing.  He denies exertional chest pain.  He notes DOE.  No change in  breathing.  NYHA Class 2 - 2b.   No orthopnea or PND.  Does have edema.   Problems Prior to Update: 1)  Dizziness  (ICD-780.4) 2)  Gerd  (ICD-530.81) 3)  Chest Pain Unspecified  (ICD-786.50) 4)  Sleep Apnea  (ICD-780.57) 5)  Tremor  (ICD-781.0) 6)  Fatigue  (ICD-780.79) 7)  Vitamin D Deficiency  (ICD-268.9) 8)  Hypogonadotropic Hypogonadism  (ICD-253.4) 9)  Other Testicular Hypofunction  (ICD-257.2) 10)  Esophageal Motility Disorder  (ICD-530.5) 11)  Gastritis, Acute  (ICD-535.00) 12)  Esophagitis  (ICD-530.10) 13)  Essential Hypertension, Benign  (ICD-401.1) 14)  Erectile Dysfunction  (ICD-302.72) 15)  Folate-deficiency Anemia  (ICD-281.2) 16)  Iron Defic Anemia Sec Diet Iron Intake  (ICD-280.1) 17)  Asthma, Unspecified, Unspecified Status  (ICD-493.90) 18)  Atrial Fibrillation  (ICD-427.31) 19)  Sick Sinus Syndrome  (ICD-427.81)  Current Medications (verified): 1)  Ferrous Sulfate 325 (65 Fe) Mg Tabs (Ferrous Sulfate) .Marland Kitchen.. 1 By Mouth Three Times A Day 2)  Lisinopril 10 Mg Tabs (Lisinopril) .Marland Kitchen.. 1 By Mouth Once Daily 3)  Amiodarone Hcl 200 Mg Tabs (Amiodarone Hcl) .Marland Kitchen.. 1 By Mouth Once Daily 4)  Folic Acid 1 Mg Tabs (Folic Acid) .Marland KitchenMarland KitchenMarland Kitchen  Take 1 Tablet By Mouth Once A Day 5)  Reglan 10 Mg Tabs (Metoclopramide Hcl) .... Take One Tablet By Mouth Before Each Meal and At Bedtime To Help With Reflux 6)  Protonix 40 Mg  Tbec (Pantoprazole Sodium) .... Take 2 Tablets By Mouth Every 12 Hours 7)  Advair Diskus 500-50 Mcg/dose Aepb (Fluticasone-Salmeterol) .Marland Kitchen.. 1 Puff Two Times A Day (Rinse and Spit After Each Use) 8)  Proventil Hfa 108 (90 Base) Mcg/act Aers (Albuterol Sulfate) .Marland Kitchen.. 1-2 Puffs Q 4-6 Hours As Needed 9)  Ergocalciferol 50000 Unit Caps (Ergocalciferol) .Marland Kitchen.. 1 By Mouth Once A Week For 12 Weeks  Allergies (verified): No Known Drug Allergies  Past History:  Past Medical History: Current Problems:  IRON DEFIC ANEMIA SEC DIET IRON INTAKE (ICD-280.1)..Lab result: Hemoccults/o EGD  and colonoscopy 08/2008.   a.  colo with hem's and diverticula and EGD with gastritis (d/c summ. indicates probable chronic blood loss) ASTHMA, UNSPECIFIED, UNSPECIFIED STATUS (ICD-493.90) ATRIAL FIBRILLATION (ICD-427.31)...sees Dr.Harwani SICK SINUS SYNDROME (ICD-427.81) NEEDLE PHOBIA...medical anxiety chest pain admx 8.2011   a.  myoview neg for ischemia; EF 51 % Sleep Apnea   a.  sleep study 8.5.2011:  Mod OSA; AHI 18/hr.; O2 desat to nadir of 91% on RA  Physical Exam  General:  alert, well-developed, and well-nourished.   Head:  normocephalic and atraumatic.   Neck:  no jvd  Lungs:  normal breath sounds, no crackles, and no wheezes.   Heart:  normal rate, regular rhythm, and no gallop.   Abdomen:  soft, non-tender, and no hepatomegaly.   Extremities:  trace left pedal edema and trace right pedal edema.   Neurologic:  alert & oriented X3 and cranial nerves II-XII intact.   Psych:  normally interactive.     Impression & Recommendations:  Problem # 1:  IRON DEFIC ANEMIA SEC DIET IRON INTAKE (ICD-280.1) f/u on CBC  His updated medication list for this problem includes:    Ferrous Sulfate 325 (65 Fe) Mg Tabs (Ferrous sulfate) .Marland Kitchen... 1 by mouth three times a day    Folic Acid 1 Mg Tabs (Folic acid) .Marland Kitchen... Take 1 tablet by mouth once a day  Orders: T-CBC w/Diff ([address])  Problem # 2:  CHEST PAIN UNSPECIFIED (ICD-786.50) sounds more GI than cardiac GI symptoms may be exacerbating asthma recent neg myoview is reassuring adjust PPI adjust asthma meds adjust BP meds   Orders: EKG w/ Interpretation ([address]000)  Problem # 3:  ESSENTIAL HYPERTENSION, BENIGN (ICD-401.1) d/c ACE . Marland Kitchen .? causing vocal cord dysfxn and exacerbating symptoms  start cozaar 50 once daily check bmet and bp check in 2 weeks  His updated medication list for this problem includes:    Cozaar 50 Mg Tabs (Losartan potassium) .Marland Kitchen... Take 1 tablet by mouth once a day for blood pressure (pharmacy: lisinopril  is d/c'd)  Problem # 4:  ASTHMA, UNSPECIFIED, UNSPECIFIED STATUS (ICD-493.90) prob part of his chest symptoms and dyspnea using proventil two times a day most days add anti histamine change ACE to ARB increase PPI  with Amio Rx, consider PFTs with DLCO at next visit depending response to changes in tx  His updated medication list for this problem includes:    Advair Diskus 500-50 Mcg/dose Aepb (Fluticasone-salmeterol) .Marland Kitchen... 1 puff two times a day (rinse and spit after each use)    Proventil Hfa 108 (90 Base) Mcg/act Aers (Albuterol sulfate) .Marland Kitchen... 1-2 puffs q 4-6 hours as needed  Problem # 5:  GERD (ICD-530.81) increase PPI to two times a  day avoid caffeine GI appt pending at [facility]  His updated medication list for this problem includes:    Protonix 40 Mg Tbec (Pantoprazole sodium) .Marland Kitchen... Take 1 tablet by mouth two times a day  Problem # 6:  SLEEP APNEA (ICD-780.57) get CPAP arranged  Problem # 7:  HYPOGONADOTROPIC HYPOGONADISM (ICD-253.4) Endo referral pending  Problem # 8:  DIZZINESS (ICD-780.4) sounds like BPPV if symptoms recur, refer to vestib rehab  His updated medication list for this problem includes:    Claritin 10 Mg Tabs (Loratadine) .Marland Kitchen... Take 1 tablet by mouth once a day  Complete Medication List: 1)  Ferrous Sulfate 325 (65 Fe) Mg Tabs (Ferrous sulfate) .Marland Kitchen.. 1 by mouth three times a day 2)  Cozaar 50 Mg Tabs (Losartan potassium) .... Take 1 tablet by mouth once a day for blood pressure (pharmacy: lisinopril is d/c'd) 3)  Amiodarone Hcl 200 Mg Tabs (Amiodarone hcl) .Marland Kitchen.. 1 by mouth once daily 4)  Folic Acid 1 Mg Tabs (Folic acid) .... Take 1 tablet by mouth once a day 5)  Reglan 10 Mg Tabs (Metoclopramide hcl) .... Take one tablet by mouth before each meal and at bedtime to help with reflux 6)  Protonix 40 Mg Tbec (Pantoprazole sodium) .... Take 1 tablet by mouth two times a day 7)  Advair Diskus 500-50 Mcg/dose Aepb (Fluticasone-salmeterol) .Marland Kitchen.. 1 puff two times a  day (rinse and spit after each use) 8)  Proventil Hfa 108 (90 Base) Mcg/act Aers (Albuterol sulfate) .Marland Kitchen.. 1-2 puffs q 4-6 hours as needed 9)  Ergocalciferol 50000 Unit Caps (Ergocalciferol) .Marland Kitchen.. 1 by mouth once a week for 12 weeks 10)  Claritin 10 Mg Tabs (Loratadine) .... Take 1 tablet by mouth once a day  Patient Instructions: 1)  Avoid caffeine. 2)  Increase Protonix to two times a day. 3)  I have sent a new prescription to [facility]. pharmacy. 4)  Keep appt with the gastroenterologist at [facility] this month and ask the doctor to send me notes. 5)  Stop taking Liinopril. 6)  Start taking Cozaar 50 mg once daily.  I have sent a prescription to [facility]. pharmacy. 7)  Return to lab for BP check and BMET (401.1).  Notify provider if BP >140/90 or < 110/60. 8)  I have added Claritin to your medicines.  Take once daily.  I have sent a prescription to the [facility]. pharmacy. 9)  Keep taking your Advair two times a day and Proventil as needed. 10)  Please schedule a follow-up appointment in 1 month with Scott for your breathing.  Prescriptions: CLARITIN 10 MG TABS (LORATADINE) Take 1 tablet by mouth once a day  #30 x 3   Entered and Authorized by:   Tereso Newcomer PA-C   Signed by:   Tereso Newcomer PA-C on 09/01/2010   Method used:   Faxed to ...       [facility] - Pharmac (retail)       [address] [place], [place]  [address]       Ph: [phone]       Fax: [phone]   RxID:   [id number] PROTONIX 40 MG  TBEC (PANTOPRAZOLE SODIUM) Take 1 tablet by mouth two times a day  #60 x 5   Entered and Authorized by:   Tereso Newcomer PA-C   Signed by:   Tereso Newcomer PA-C on 09/01/2010   Method used:   Faxed to .Marland KitchenMarland Kitchen  [facility] - Pharmac (retail)       [address] [place], [place]  [address]       Ph: [phone]       Fax: [phone]   RxID:   [id number] COZAAR 50 MG TABS (LOSARTAN POTASSIUM) Take  1 tablet by mouth once a day for blood pressure (pharmacy: Lisinopril is d/c'd)  #30 x 5   Entered and Authorized by:   Tereso Newcomer PA-C   Signed by:   Tereso Newcomer PA-C on 09/01/2010   Method used:   Faxed to ...       [facility] - Pharmac (retail)       [address] [place], [place]  [address]       Ph: [phone]       Fax: [phone]   RxID:   [id number]    EKG  Procedure date:  09/01/2010  Findings:      Normal sinus rhythm with rate of:  63 normal axis no isch changes

## 2011-01-24 NOTE — Progress Notes (Signed)
  Phone Note Outgoing Call   Summary of Call: See labs 2/23 He was supposed to also get: Folic Acid level Vit D PSA Prolactin LH  I do not see these on prelim report Need these too. If cannot be added on, have pt return for labs  Initial call taken by: Brynda Rim,  February 18, 2010 10:33 AM  Follow-up for Phone Call        pt has to come in for these labs could not order them Follow-up by: Arm[place] Shannon,  February 22, 2010 10:34 AM  Additional Follow-up for Phone Call Additional follow up Details #1::        ok Additional Follow-up by: Tereso Newcomer PA-C,  February 22, 2010 2:22 PM

## 2011-01-24 NOTE — Letter (Signed)
Summary: Generic Letter  [facility]  [address] [place], [place] [address]   Phone: [phone]  Fax: [phone]    06/14/2010  To whom it may concern:  My patient, [name] [name] (DOB 1951/06/24) has a health condition that requires he spend only limited amount of time outside when the air quality is poor.  He should be allowed to spend most of his time indoors when the air quality is "orange" or worse.  When it is "yellow" he should be allowed to come inside frequently.  If you have any further questions, please feel free to contact me.        Sincerely,   Tereso Newcomer PA-C

## 2011-01-24 NOTE — Progress Notes (Signed)
  Phone Note Outgoing Call   Summary of Call: 9/23 document scanned is for [name], not this patient Initial call taken by: Brynda Rim,  September 29, 2010 5:47 PM  Follow-up for Phone Call        [facility] TOOK CARE OF THIS [name] [name] Follow-up by: Arta Bruce,  September 30, 2010 9:53 AM

## 2011-01-24 NOTE — Miscellaneous (Signed)
  Clinical Lists Changes  Problems: Changed problem from HYPOGONADOTROPIC HYPOGONADISM (ICD-253.4) to HYPOGONADOTROPIC HYPOGONADISM (ICD-253.4) - eval by Dr. Celso Sickle at [facility] on 9.20.2011

## 2011-01-24 NOTE — Progress Notes (Signed)
  Phone Note Call from Patient   Summary of Call: [name] [name], this pt has a referral in the system to see endocrinologist (missed spelled) Initial call taken by: Armenia Shannon,  March 29, 2010 12:24 PM     Appended Document:     Phone Note Outgoing Call   Summary of Call: Has he gotten endocrine appt yet? Initial call taken by: Brynda Rim,  June 14, 2010 5:24 PM

## 2011-01-24 NOTE — Progress Notes (Signed)
Summary: GOT A CALL ABOUT A COLONOSCOPY  Phone Note Call from Patient Call back at Home Phone [phone]   Complaint: Abdominal Pain Summary of Call: [name] [name] PT. [name] [name] SAYS THAT HE GOT A CALL A COUPLE OF WEEKS AGO FROM [facility] TO SCHEDULE HIM FOR A COLONOSCOPY. AND HE SAYS THAT WHEN HE WAS IN THE HOSPITAL IN 2009 HE HAD ONE THEN. HE IS CALLING TO FIND OUT IF HE WILL NEED ANOTHER ONE. HE DIDN'T KNOW WHO WAS TRYING TO FIND OUT WHY HE GOT A CALL. Initial call taken by: Leodis Rains,  January 13, 2010 9:18 AM  Follow-up for Phone Call        [name] [name] it looks as if you put the order in Oct for pt to have a colonoscopy...Marland KitchenMarland Kitchen you did want pt to have another done right..Armenia Shannon  January 13, 2010 12:24 PM   Additional Follow-up for Phone Call Additional follow up Details #1::        I saw him in Oct and he had worsening problems with swallowing food.  I referred him back to gastroenterology to further evaluate this. Additional Follow-up by: Tereso Newcomer PA-C,  January 13, 2010 4:55 PM    Additional Follow-up for Phone Call Additional follow up Details #2::    spoke with pt and he is aware of the appt and will call them to take appt time and date.Marland KitchenMarland KitchenArmenia Shannon  January 14, 2010 4:09 PM

## 2011-01-24 NOTE — Letter (Signed)
Summary: GASTROENTEROLOGY  GASTROENTEROLOGY   Imported By: Arta Bruce 03/31/2010 11:48:58  _____________________________________________________________________  External Attachment:    Type:   Image     Comment:   External Document

## 2011-01-24 NOTE — Progress Notes (Signed)
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  MR [name] [name] SAYS THAT HE WAS ADVISED BY [name] [name] EARLIER TO CUT BACK ON HIS WORK BECAUSE OF THIS HEAT, WHICH CAUSES HIS BREATH TO BE EXTREMLEY SHORT. HE SAYS THAT HE HAS TO PRESENT THIS TO THEM ON  TOMORROW, AND HE WAS JUST TOLD THIS TODAY. Initial call taken by: Leodis Rains,  June 14, 2010 4:58 PM  Follow-up for Phone Call        Letter in basket to fax.  Follow-up by: Tereso Newcomer PA-C,  June 14, 2010 5:20 PM  Additional Follow-up for Phone Call Additional follow up Details #1::        MR [name] [name] PICKED UP LETTER TODAY. Additional Follow-up by: Leodis Rains,  June 15, 2010 12:49 PM

## 2011-01-24 NOTE — Letter (Signed)
Summary: [facility] PROGRESS [facility]  [facility]ESS NOTE   Imported By: Arta Bruce 09/20/2010 11:39:39  _____________________________________________________________________  External Attachment:    Type:   Image     Comment:   External Document

## 2011-01-24 NOTE — Assessment & Plan Note (Signed)
Summary: FOLLOW-UP BP///BC   Vital Signs:  Patient profile:   60 year old male Height:      66 inches Weight:      244 pounds BMI:     39.52 Temp:     98.6 degrees F oral Pulse rate:   82 / minute Pulse rhythm:   regular Resp:     18 per minute BP sitting:   132 / 81  (left arm) Cuff size:   large  Vitals Entered By: Arm[place] Shannon (January 20, 2010 11:43 AM) CC: f/u.... pt wants the results of blood test.... pt says he feels slugish..., Hypertension Management  Does patient need assistance? Functional Status Self care Ambulation Normal   CC:  f/u.... pt wants the results of blood test.... pt says he feels slugish... and Hypertension Management.  History of Present Illness: Here for f/u.  Esoph dysmotility:  Not seen surgeon yet.  Has a h/o foreign body and esoph perf. Has had trouble since then.  Asthma:  Not taking Advair correctly.  Taking as needed and proventil two times a day.  Symptoms of asthma overall stable though.  Advised he take the Advair two times a day and change proventil to as needed.  Erectile dysfxn:  Test. level below 200.  But, patient has gynecomastia and small genitalia and no h/o facial hair.  He has been this way his whole life.  He has had trouble achieving erections and has had trouble his whole life.  He notes that he has been unable to maintain erections to mastubate even during puberty.  He has fathered a child . . . naturally.  Not sure he has ever been evaluated for genetic variant (i.e XXY).  However, having a child would go against this.  Anemia:  No significant w/u in past.  WIll check labs now.  Asthma History    Asthma Control Assessment:    Age range: 12+ years    Symptoms: 0-2 days/week    Nighttime Awakenings: 0-2/month    Interferes w/ normal activity: some limitations    SABA use (not for EIB): 0-2 days/week    Asthma Control Assessment: Not Well Controlled  Hypertension History:      He complains of dyspnea with exertion, but  denies chest pain and syncope.        Positive major cardiovascular risk factors include male age 30 years old or older and hypertension.  Negative major cardiovascular risk factors include non-tobacco-user status.      Problems Prior to Update: 1)  Other Testicular Hypofunction  (ICD-257.2) 2)  Esophageal Motility Disorder  (ICD-530.5) 3)  Gastritis, Acute  (ICD-535.00) 4)  Esophagitis  (ICD-530.10) 5)  Essential Hypertension, Benign  (ICD-401.1) 6)  Erectile Dysfunction  (ICD-302.72) 7)  Folate-deficiency Anemia  (ICD-281.2) 8)  Iron Defic Anemia Sec Diet Iron Intake  (ICD-280.1) 9)  Asthma, Unspecified, Unspecified Status  (ICD-493.90) 10)  Atrial Fibrillation  (ICD-427.31) 11)  Sick Sinus Syndrome  (ICD-427.81)  Current Medications (verified): 1)  Ferrous Sulfate 325 (65 Fe) Mg Tabs (Ferrous Sulfate) .Marland Kitchen.. 1 By Mouth Three Times A Day 2)  Lisinopril 10 Mg Tabs (Lisinopril) .Marland Kitchen.. 1 By Mouth Once Daily 3)  Amiodarone Hcl 200 Mg Tabs (Amiodarone Hcl) .Marland Kitchen.. 1 By Mouth Once Daily 4)  Folic Acid 1 Mg Tabs (Folic Acid) .... Take 1 Tablet By Mouth Once A Day 5)  Reglan 10 Mg Tabs (Metoclopramide Hcl) .... Take One Tablet By Mouth Before Each Meal and At Bedtime To Help With  Reflux 6)  Protonix 40 Mg  Tbec (Pantoprazole Sodium) .... Take 2 Tablets By Mouth Every 12 Hours 7)  Advair Diskus 250-50 Mcg/dose Misc (Fluticasone-Salmeterol) .Marland Kitchen.. 1 Puff 2 Times Daily Rinse and Spit After Use 8)  Proventil Hfa 108 (90 Base) Mcg/act Aers (Albuterol Sulfate) .Marland Kitchen.. 1-2 Puffs Q 4-6 Hours As Needed  Allergies (verified): No Known Drug Allergies  Past History:  Past Medical History: Last updated: 12/13/2009 Current Problems:  IRON DEFIC ANEMIA SEC DIET IRON INTAKE (ICD-280.1)..Lab result: Hemoccults/o EGD and colonoscopy 08/2008.   a.  colo with hem's and diverticula and EGD with gastritis (d/c summ. indicates probable chronic blood loss) ASTHMA, UNSPECIFIED, UNSPECIFIED STATUS (ICD-493.90) ATRIAL  FIBRILLATION (ICD-427.31)...sees Dr.Harwani SICK SINUS SYNDROME (ICD-427.81) NEEDLE PHOBIA...medical anxiety  Physical Exam  General:  alert, well-developed, and well-nourished.   Head:  normocephalic and atraumatic.   Neck:  supple.   Breasts:  gynecomastia.   Lungs:  normal breath sounds, no crackles, and no wheezes.   Heart:  normal rate and regular rhythm.   Neurologic:  alert & oriented X3 and cranial nerves II-XII intact.   Psych:  normally interactive.     Impression & Recommendations:  Problem # 1:  OTHER TESTICULAR HYPOFUNCTION (ICD-257.2)  ? if he has genetic variant not sure about pursuing DNA analysis at this point  will start by getting  AM testosterone level LH Prolactin PSA Vit. D level consider referring to endocrinology  of note, metoclopramide can inhibit prolactin secretion and he takes this med every day  Orders: T-Testosterone, Free and Total 520 140 7246) T-LH [phone]) T-Prolactin [phone]) T-PSA [phone]) T-Assay of Vitamin D (32440-10272)  Problem # 2:  ESOPHAGEAL MOTILITY DISORDER (ICD-530.5) f/u with GI as scheduled  Problem # 3:  ESSENTIAL HYPERTENSION, BENIGN (ICD-401.1) controlled check labs  His updated medication list for this problem includes:    Lisinopril 10 Mg Tabs (Lisinopril) .Marland Kitchen... 1 by mouth once daily  Problem # 4:  ATRIAL FIBRILLATION (ICD-427.31)  Dr. Sharyn Lull requests Lipids CMET TSH  His updated medication list for this problem includes:    Amiodarone Hcl 200 Mg Tabs (Amiodarone hcl) .Marland Kitchen... 1 by mouth once daily  Orders: T-Comprehensive Metabolic Panel [phone]) T-Lipid Profile (42595-63875) T-TSH [phone])  Problem # 5:  IRON DEFIC ANEMIA SEC DIET IRON INTAKE (ICD-280.1)  anemia never completely worked up  will check labs  His updated medication list for this problem includes:    Ferrous Sulfate 325 (65 Fe) Mg Tabs (Ferrous sulfate) .Marland Kitchen... 1 by mouth three times a day    Folic  Acid 1 Mg Tabs (Folic acid) .Marland Kitchen... Take 1 tablet by mouth once a day  Orders: Hemoccult Cards -3 specimans (take home) (41660) T-CBC w/Diff [phone]) T-Folic Acid; RBC [phone]) T-Iron [phone]) T-Iron Binding Capacity (TIBC) ([address]) T-Ferritin (60737-10626) T-Reticulocyte Count, Manual (94854) T-Hemoglobin Electrophoresis (62703-50093) T-Vitamin B12 (81829-93716)  Problem # 6:  ASTHMA, UNSPECIFIED, UNSPECIFIED STATUS (ICD-493.90) advised him on proper use of advair o/w symptoms fairly well controlled  His updated medication list for this problem includes:    Advair Diskus 250-50 Mcg/dose Misc (Fluticasone-salmeterol) .Marland Kitchen... 1 puff 2 times daily rinse and spit after use    Proventil Hfa 108 (90 Base) Mcg/act Aers (Albuterol sulfate) .Marland Kitchen... 1-2 puffs q 4-6 hours as needed  Complete Medication List: 1)  Ferrous Sulfate 325 (65 Fe) Mg Tabs (Ferrous sulfate) .Marland Kitchen.. 1 by mouth three times a day 2)  Lisinopril 10 Mg Tabs (Lisinopril) .Marland Kitchen.. 1 by mouth once daily 3)  Amiodarone Hcl 200 Mg Tabs (  Amiodarone hcl) .Marland Kitchen.. 1 by mouth once daily 4)  Folic Acid 1 Mg Tabs (Folic acid) .... Take 1 tablet by mouth once a day 5)  Reglan 10 Mg Tabs (Metoclopramide hcl) .... Take one tablet by mouth before each meal and at bedtime to help with reflux 6)  Protonix 40 Mg Tbec (Pantoprazole sodium) .... Take 2 tablets by mouth every 12 hours 7)  Advair Diskus 250-50 Mcg/dose Misc (Fluticasone-salmeterol) .Marland Kitchen.. 1 puff 2 times daily rinse and spit after use 8)  Proventil Hfa 108 (90 Base) Mcg/act Aers (Albuterol sulfate) .Marland Kitchen.. 1-2 puffs q 4-6 hours as needed  Hypertension Assessment/Plan:      The patient's hypertensive risk group is category B: At least one risk factor (excluding diabetes) with no target organ damage.  His calculated 10 year risk of coronary heart disease is 9 %.  Today's blood pressure is 132/81.  His blood pressure goal is < 140/90.  Patient Instructions: 1)  Please schedule a  follow-up appointment in 2 months with Sakiya Stepka for blood pressure and anemia.

## 2011-01-26 ENCOUNTER — Emergency Department (HOSPITAL_COMMUNITY): Payer: Self-pay

## 2011-01-26 ENCOUNTER — Emergency Department (HOSPITAL_COMMUNITY)
Admission: EM | Admit: 2011-01-26 | Discharge: 2011-01-26 | Disposition: A | Discharge status: Home / Self Care | Payer: Self-pay | Attending: Emergency Medicine | Admitting: Emergency Medicine

## 2011-01-26 DIAGNOSIS — I4891 Unspecified atrial fibrillation: Secondary | ICD-10-CM | POA: Insufficient documentation

## 2011-01-26 DIAGNOSIS — K219 Gastro-esophageal reflux disease without esophagitis: Secondary | ICD-10-CM | POA: Insufficient documentation

## 2011-01-26 DIAGNOSIS — R0602 Shortness of breath: Secondary | ICD-10-CM | POA: Insufficient documentation

## 2011-01-26 DIAGNOSIS — I10 Essential (primary) hypertension: Secondary | ICD-10-CM | POA: Insufficient documentation

## 2011-01-26 DIAGNOSIS — K279 Peptic ulcer, site unspecified, unspecified as acute or chronic, without hemorrhage or perforation: Secondary | ICD-10-CM | POA: Insufficient documentation

## 2011-01-26 DIAGNOSIS — H81319 Aural vertigo, unspecified ear: Secondary | ICD-10-CM | POA: Insufficient documentation

## 2011-01-26 DIAGNOSIS — R61 Generalized hyperhidrosis: Secondary | ICD-10-CM | POA: Insufficient documentation

## 2011-01-26 LAB — COMPREHENSIVE METABOLIC PANEL
ALT: 12 U/L (ref 0–53)
AST: 23 U/L (ref 0–37)
CO2: 27 mEq/L (ref 19–32)
Chloride: 104 mEq/L (ref 96–112)
Creatinine, Ser: 1.05 mg/dL (ref 0.4–1.5)
GFR calc Af Amer: >60 mL/min (ref >60)
GFR calc non Af Amer: >60 mL/min (ref >60)
Glucose, Bld: 84 mg/dL (ref 70–99)
Total Bilirubin: 0.2 mg/dL — ABNORMAL LOW (ref 0.3–1.2)

## 2011-01-26 LAB — DIFFERENTIAL
Basophils Absolute: 0.1 10*3/uL (ref 0.0–0.1)
Eosinophils Absolute: 0.2 10*3/uL (ref 0.0–0.7)
Eosinophils Relative: 2 % (ref 0–5)
Lymphs Abs: 1.2 10*3/uL (ref 0.7–4.0)
Smear Review: ADEQUATE

## 2011-01-26 LAB — CBC
HCT: 33.7 % — ABNORMAL LOW (ref 39.0–52.0)
Hemoglobin: 10.3 g/dL — ABNORMAL LOW (ref 13.0–17.0)
MCH: 20.4 pg — ABNORMAL LOW (ref 26.0–34.0)
RBC: 5.06 MIL/uL (ref 4.22–5.81)

## 2011-01-26 LAB — URINALYSIS, ROUTINE W REFLEX MICROSCOPIC
Hgb urine dipstick: NEGATIVE
Nitrite: NEGATIVE
Specific Gravity, Urine: 1.02 (ref 1.005–1.030)
Urine Glucose, Fasting: NEGATIVE mg/dL
pH: 8 (ref 5.0–8.0)

## 2011-01-26 LAB — CK TOTAL AND CKMB (NOT AT ARMC): Total CK: 89 U/L (ref 7–232)

## 2011-01-26 NOTE — Letter (Signed)
Summary: [facility]/OFFICE VISIT  [facility]/OFFICE VISIT   Imported By: Arta Bruce 01/13/2011 15:38:04  _____________________________________________________________________  External Attachment:    Type:   Image     Comment:   External Document

## 2011-01-26 NOTE — Letter (Signed)
Summary: [facility]/HYPOGONADOREOPIN HYPOGONADISM  [facility]/HYPOGONADOREOPIN HYPOGONADISM   Imported By: Arta Bruce 01/10/2011 11:13:11  _____________________________________________________________________  External Attachment:    Type:   Image     Comment:   External Document

## 2011-01-26 NOTE — Progress Notes (Signed)
Summary: Urine drug screen  Phone Note Call from Patient   Summary of Call: pt called saying that he need a drug test for his job.... Initial call taken by: [place] Shannon,  January 13, 2011 10:40 AM  Follow-up for Phone Call        urine drug screen results received but where does it need to be sent? Follow-up by: Lehman Prom FNP,  January 16, 2011 9:16 AM  Additional Follow-up for Phone Call Additional follow up Details #1::        PT WILL COME TO PICK IT UP Additional Follow-up by: [place] Shannon,  January 16, 2011 10:12 AM

## 2011-01-26 NOTE — Letter (Signed)
Summary: REQUESTED RECORDS FOR SELF  REQUESTED RECORDS FOR SELF   Imported By: Arta Bruce 01/16/2011 10:47:58  _____________________________________________________________________  External Attachment:    Type:   Image     Comment:   External Document

## 2011-01-26 NOTE — Miscellaneous (Signed)
Summary: Med addition  Clinical Lists Changes  Medications: Added new medication of ANDROGEL 25 MG/2.5GM GEL (TESTOSTERONE) 1 packet of gel to skin on shoulder or upper chest daily rotate siges daily

## 2011-02-21 ENCOUNTER — Telehealth (INDEPENDENT_AMBULATORY_CARE_PROVIDER_SITE_OTHER): Payer: Self-pay | Admitting: Nurse Practitioner

## 2011-03-02 NOTE — Progress Notes (Signed)
Summary: missing lab work  Phone Note Call from Patient   Summary of Call: scott wanted pt to come in for vitamin d.... it was never done cause pt is hard stick... please check to see if labs need to be done and scheudle pt if needed...  please respond to your nurse.. Initial call taken by: [place] Shannon,  February 21, 2011 2:47 PM  Follow-up for Phone Call        looks like pt was in office during january for other labs so he could have had it done then.  At this point will do it during next routine lab visit. no need for pt to come in especially for vitamin d lab. it appears he was taking a supplement back in 06/2010 which is likely the reason he wanted it checked Follow-up by: Lehman Prom FNP,  February 21, 2011 5:59 PM

## 2011-03-10 LAB — DIFFERENTIAL
Basophils Absolute: 0.1 10*3/uL (ref 0.0–0.1)
Basophils Relative: 1 % (ref 0–1)
Eosinophils Absolute: 0.1 10*3/uL (ref 0.0–0.7)
Eosinophils Relative: 1 % (ref 0–5)
Lymphocytes Relative: 13 % (ref 12–46)
Lymphs Abs: 1.1 10*3/uL (ref 0.7–4.0)
Monocytes Absolute: 0.8 10*3/uL (ref 0.1–1.0)
Monocytes Relative: 9 % (ref 3–12)
Neutro Abs: 6.6 10*3/uL (ref 1.7–7.7)
Neutrophils Relative %: 76 % (ref 43–77)

## 2011-03-10 LAB — CK TOTAL AND CKMB (NOT AT ARMC)
CK, MB: 0.8 ng/mL (ref 0.3–4.0)
Relative Index: INVALID (ref 0.0–2.5)
Total CK: 65 U/L (ref 7–232)

## 2011-03-10 LAB — APTT: aPTT: 82 seconds — ABNORMAL HIGH (ref 24–37)

## 2011-03-10 LAB — HEPARIN LEVEL (UNFRACTIONATED): Heparin Unfractionated: 0.39 IU/mL (ref 0.30–0.70)

## 2011-03-10 LAB — CBC
HCT: 29.9 % — ABNORMAL LOW (ref 39.0–52.0)
HCT: 31.6 % — ABNORMAL LOW (ref 39.0–52.0)
HCT: 34.5 % — ABNORMAL LOW (ref 39.0–52.0)
Hemoglobin: 10.7 g/dL — ABNORMAL LOW (ref 13.0–17.0)
MCH: 20.9 pg — ABNORMAL LOW (ref 26.0–34.0)
MCH: 21.2 pg — ABNORMAL LOW (ref 26.0–34.0)
MCHC: 30.4 g/dL (ref 30.0–36.0)
MCHC: 31 g/dL (ref 30.0–36.0)
MCHC: 31 g/dL (ref 30.0–36.0)
MCV: 67.7 fL — ABNORMAL LOW (ref 78.0–100.0)
MCV: 68.3 fL — ABNORMAL LOW (ref 78.0–100.0)
MCV: 68.6 fL — ABNORMAL LOW (ref 78.0–100.0)
Platelets: 317 10*3/uL (ref 150–400)
Platelets: 325 10*3/uL (ref 150–400)
RBC: 5.05 MIL/uL (ref 4.22–5.81)
RDW: 16.4 % — ABNORMAL HIGH (ref 11.5–15.5)
RDW: 16.6 % — ABNORMAL HIGH (ref 11.5–15.5)
RDW: 16.7 % — ABNORMAL HIGH (ref 11.5–15.5)
WBC: 10.3 10*3/uL (ref 4.0–10.5)
WBC: 8.7 10*3/uL (ref 4.0–10.5)

## 2011-03-10 LAB — RETICULOCYTES
RBC.: 4.97 MIL/uL (ref 4.22–5.81)
Retic Count, Absolute: 29.8 10*3/uL (ref 19.0–186.0)
Retic Ct Pct: 0.6 % (ref 0.4–3.1)

## 2011-03-10 LAB — CARDIAC PANEL(CRET KIN+CKTOT+MB+TROPI)
CK, MB: 0.7 ng/mL (ref 0.3–4.0)
CK, MB: 0.9 ng/mL (ref 0.3–4.0)
Relative Index: INVALID (ref 0.0–2.5)
Relative Index: INVALID (ref 0.0–2.5)
Total CK: 52 U/L (ref 7–232)
Total CK: 57 U/L (ref 7–232)
Troponin I: 0.01 ng/mL (ref 0.00–0.06)
Troponin I: 0.01 ng/mL (ref 0.00–0.06)

## 2011-03-10 LAB — URINE MICROSCOPIC-ADD ON

## 2011-03-10 LAB — TSH: TSH: 0.978 u[IU]/mL (ref 0.350–4.500)

## 2011-03-10 LAB — BASIC METABOLIC PANEL
Calcium: 8.9 mg/dL (ref 8.4–10.5)
Creatinine, Ser: 1.1 mg/dL (ref 0.4–1.5)
GFR calc Af Amer: >60 mL/min (ref >60)

## 2011-03-10 LAB — FOLATE: Folate: 10.5 ng/mL

## 2011-03-10 LAB — VITAMIN B12: Vitamin B-12: 325 pg/mL (ref 211–911)

## 2011-03-10 LAB — IRON AND TIBC
Iron: 48 ug/dL (ref 42–135)
Saturation Ratios: 18 % — ABNORMAL LOW (ref 20–55)
TIBC: 264 ug/dL (ref 215–435)

## 2011-03-10 LAB — URINALYSIS, ROUTINE W REFLEX MICROSCOPIC
Bilirubin Urine: NEGATIVE
Hgb urine dipstick: NEGATIVE
Specific Gravity, Urine: 1.019 (ref 1.005–1.030)
pH: 8.5 — ABNORMAL HIGH (ref 5.0–8.0)

## 2011-03-10 LAB — FERRITIN: Ferritin: 32 ng/mL (ref 22–322)

## 2011-03-10 LAB — TROPONIN I: Troponin I: 0.01 ng/mL (ref 0.00–0.06)

## 2011-05-12 NOTE — Op Note (Signed)
[facility]  Patient:    [name] [name], [name] [name]                         MRN: [id number] Proc. Date: 01/28/01 Adm. Date:  [id number] Attending:  Barbee Cough                           Operative Report  PREOPERATIVE DIAGNOSES: 1. Retropharyngeal abscess. 2. Progressive odynophagia.  POSTOPERATIVE DIAGNOSIS: 1. Esophageal foreign body (chicken bone). 2. Esophageal perforation. 3. Retropharyngeal abscess.  PROCEDURES: 1. Direct laryngoscopy. 2. Rigid esophagoscopy and removal of esophageal foreign body.  ANESTHESIA:  General endotracheal.  SURGEON:  Kinnie Scales. Annalee Genta, M.D.  ESTIMATED BLOOD LOSS:  Minimal.  COMPLICATIONS:  None.  FINDINGS: 1. A 4 cm chicken bone lodged in the proximal esophagus approximately 5 cm    from the esophageal introitus with bilateral lateral esophageal    perforations. 2. Heavy soft tissue edema involving the posterior pharyngeal mucosa    consistent with retropharyngeal edema and possible early retropharyngeal    abscess.  DISPOSITION:  The patient was transferred from the operating room to the intensive care unit at [facility].  BRIEF HISTORY:  [name] [name] is a 60 year old black male who presented to the [facility] Emergency Department with a four-day history of progressive dysphagia and odynophagia.  Patient reports eating chicken on Thursday evening, January 24, 2001, when he had a sensation of partial obstruction and pain.  This gradually resolved over approximately 12 hours, and by the next day he was able to tolerate a liquid and soft oral diet.  The patient continued to eat in limited quantities over the next three days.  On the day prior to admission (January 27, 2001), [name] [name] reports that he began to have progressively increasing dysphagia and worsening sore throat.  He presented to the [facility] Emergency Department in the morning on February 4, with worsening symptoms.  There was no stridor or  airway obstruction, and the patient was afebrile on initial admission.  Soft tissue lateral films of the neck showed retropharyngeal edema, and a CT scan was obtained.  This showed significant soft tissue fullness in the prevertebral space and edema of the soft tissues of the posterior pharynx, no evidence of fluid collection.  The patient was noted to have free air in the right hypopharyngeal/paraesophageal region.  White cell count was 22.9, and the patient had gradual increase in temperature and was febrile to 100.3 degrees Fahrenheit on evaluation. Flexible nasolaryngoscopy was performed.  The patients anterior airway was normal, but he had significant soft tissue edema involving the posterior pharyngeal space.  Given the patients progressive symptoms and findings, I recommended that we take him to the operating room for direct laryngoscopy, cervical esophagoscopy, and possible removal of foreign body, and evaluation of retropharyngeal abscess.  The risks, benefits, and possible complications of the surgical procedure were discussed in detail with the patient.  He understood and concurred with our plan, and informed consent was obtained.  DESCRIPTION OF PROCEDURE:  The patient was brought to the operating room on January 28, 2001, and placed in the supine position on the operating table. General endotracheal anesthesia was established without difficulty.  When the patient had been adequately anesthetized, the oral cavity and oropharynx were cleared of secretions, and a Dedo laryngoscope was used to examine him.  There was no evidence of loose or broken teeth.  No bleeding  or trauma to the oral cavity.  The patient had 2+ tonsils and normal palate.  The posterior pharyngeal wall was edematous, soft tissue was boggy.  There was no palpable abscess or fluid collection.  The laryngeal structures were normal. Hypopharynx was normal bilaterally without evidence of perforation, foreign body,  bleeding, or injury.  Cervical esophagoscopy was then performed using the rigid esophagoscope.  The patient was examined.  I gently introduced the esophagoscope through the esophageal introitus without difficulty, and the esophagoscope was inserted to its full length.  The patient was noted to have an esophageal foreign body, which turned out to be a chicken bone, which was wedged in a horizontal fashion across the esophagus at approximately 5 cm from the esophageal introitus.  Using grasping forceps, the foreign body was grasped in the midline and pulled against the opening of the esophagoscope, cracking the chicken bone in half and allowing me to gently remove the foreign body without further damage to the esophagus.  The foreign body and esophagoscope were removed, and the cervical esophagoscope was then reinserted.  The patients esophagus was thoroughly examined.  There was heavy erythema and edema of the paraesophageal region.  Two small perforations were noted on the lateral aspects of the esophagus, and a small amount of purulent material was expressed.  This was suctioned.  Beyond this area, the esophagus appeared normal, and there was no evidence of additional esophageal injury or foreign body noted.  The esophagoscope was then removed without difficulty, and the patients oral cavity and oropharynx were again thoroughly suctioned. There was no evidence of bleeding or trauma.  The patient had very poor oral dentition.  No evidence of broken teeth was noted.  The patient was then transferred directly from the operating room to the intensive care unit.  He remained intubated and sedated for airway management and longterm control of his ongoing retropharyngeal infection.  There was no evidence of active retropharyngeal abscess, and no evidence of acute fluid collection was noted on direct laryngoscopy or cervical esophagoscopy. DD:  01/28/01 TD:  01/29/01 Job: [id number]

## 2011-05-12 NOTE — H&P (Signed)
[facility]  Patient:    [name] [name], [name] [name] Visit Number: [id number] MRN: [id number]          Service Type: MED Location: [id number] Attending Physician:  Georgann Housekeeper Dictated by:   Pearla Dubonnet, M.D. Admit Date:  07/06/2002   CC:         Tyson Dense, M.D.   History and Physical  DATE OF BIRTH:  February 26, 1954  CHIEF COMPLAINT:  Shortness of breath, chills, orthopnea.  HISTORY OF PRESENT ILLNESS:  Mr. Hiltner is a 60 year old male with a history of (1) Lifelong asthma.  (2) Obesity.  (3) Question of a small MI in the distant past.  (4) Esophageal rupture February 2002, secondary to a chicken bone.  He presents with feeling very fatigued for one week, night sweats, feeling cold and hot, and one day of orthopnea as well as decreased appetite.  The patient had excellent energy and felt well up until one week ago.  The patient apparently several first cousins with premature death from coronary artery disease - died before age 68.  His chest x-ray tonight reveals CHF and right basilar atelectasis and a small right effusion.  He has responded to diuresis. Admitted for new onset CHF as well as fever.  MEDICATIONS:  Albuterol 2 puffs b.i.d.  ALLERGIES:  None.  PAST SURGICAL HISTORY: 1. Esophageal rupture repair, February 2002. 2. Abdominal lipoma resection in the past.  FAMILY HISTORY:  Father is 17, was healthy until age 56, and he does have a medical problem now, but the son is not sure what it is.  It apparently is not too serious.  Mother died at age 81 of metastatic cancer of the breast. Again, he has several male first cousins with early death from cardiac disease.  SOCIAL HISTORY:  No tobacco use.  No alcohol use.  The patient is single, never married, reports no exposure to sexual transmitted diseases.  He reports that he is heterosexual.  Never had an STD by history.  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   He currently is driving a taxi. He has a 57 year old brother - Demone Lyles - who can be reached at [phone].  REVIEW OF SYSTEMS:  The patient denies a history of diabetes, hypertension, or exposure to STDs.  Appetite has been decreased for a day.  Otherwise, as per HPI.  PHYSICAL EXAMINATION:  GENERAL:  The patient is an obese male, appearing comfortable.  He is urinating often into the urinal.  He has had 40 of IV Lasix.  VITAL SIGNS:  Temperature 101.2, pulse 111, respiratory rate 24 and easy, blood pressure 144/83.  HEENT:  Atraumatic, normocephalic.  Oropharynx is clear.  NECK:  Without scar.  No JVD.  CHEST:  Quiet breath sounds diffusely.  There are some rales in the right base and left base.  There are no expiratory wheezes.  CARDIAC:  Regular rhythm without murmur or gallop.  ABDOMEN:  Soft with well-healed mid abdominal scar.  EXTREMITIES:  Trace pretibial edema.  GENITOURINARY:  Uncircumcised male.  RECTAL:  Deferred.  NEUROLOGIC:  Oriented x 3.  Cranial nerves intact.  Nonfocal exam.  LABORATORY DATA:  Chest x-ray reveals pulmonary edema with right lower lobe effusion and atelectasis.  EKG is sinus tachycardic at 107.  Q wave in lead III.  No ST segment elevation or depression.  White count 15,600, hemoglobin 10.9, platelet count 444,000.  The patient had 76% polys, 15% lymphocytes, 8% monocytes.  RDW is increased at 15.22.  MCV is decreased at 68.  Sodium 132, potassium 3.2, chloride 100, bicarb 25, BUN 9, creatinine .9, chloride 98, calcium 8.4.  LFTs within normal limits.  Total protein is 8.1 - upper limits of normal.  Albumin is slightly low at 3.3.  CPK and CK-MB 47, 0.5, troponin I 0.01.  ASSESSMENT:  A 60 year old male with fever, chills, orthopnea, exertional fatigue, and anemia.  Chest x-ray consistent with pulmonary edema, question of an infiltrate.  The patient likely has pneumonia but also worried about MYOCARDIA causing new congestive  heart failure.  PLAN: 1. For fever, right lobe atelectasis, and question of infiltrate, we will    start IV Tequin. 2. History of asthma - no wheezes currently.  Use Xopenex nebulizers q.4-6h.    p.r.n. and nasal cannula O2. 3. Congestive heart failure - question secondary to cardiac ischemia.  Check    2-D echocardiogram as well as serial cardiac enzymes.  Start nitroglycerin    patch as well as 1 baby aspirin daily and subcu Lovenox 40 mg q.d.  Keep an    eye on ins and outs.  Will give no added salt diet and will likely need    adenosine Cardiolite or stress Cardiolite 4. Anemia, looks like iron deficiency.  Check iron and iron binding capacity.    Hemoccult stools x 3.  He is mildly anemic, but I do want to make sure that he is protected cardiac-wise, and that is why I am using baby aspirin.  Will also use Protonix for gastrointestinal prophylaxis. Dictated by:   Pearla Dubonnet, M.D. Attending Physician:  Georgann Housekeeper DD:  07/06/02 TD:  07/08/02 Job: [id number]

## 2011-05-12 NOTE — Discharge Summary (Signed)
[facility]  Patient:    REA, RESER Visit Number: [id number] MRN: [id number]          Service Type: MED Location: 3W [id number] Attending Physician:  Georgann Housekeeper Dictated by:   Tyson Dense, M.D. Admit Date:  07/06/2002 Discharge Date: 07/15/2002   CC:         Beverley Fiedler, M.D., [facility]   Discharge Summary  DATE OF BIRTH:  February 26, 1954  DISCHARGE DIAGNOSES: 1. Dyspnea. 2. Right pleural effusion, exudative. 3. Pneumonia. 4. Anemia. 5. Asthma.  DISCHARGE MEDICATIONS: 1. Augmentin 875 mg twice a day for two weeks. 2. Pulmicort inhaler 1 puff q.d. 3. Albuterol MDI p.r.n.  ACTIVITY: As tolerated.  DIET: Regular.  FOLLOWUP: Followup with Turkey Rankins at [facility] in 3-4 weeks.  CONDITION ON DISCHARGE: Stable.  HOSPITAL COURSE: The patient is 60 years old with a history of mild asthma, admitted with dyspnea, progressive for a week with fatigue.  #1 - DYSPNEA/PULMONARY: The patient had initial chest x-ray in the emergency room which showed questionable congestive heart failure. His ECG was unremarkable and enzymes were negative. The patient was admitted for telemetry, initially ruled out by the enzymes. CPK, troponin negative. ECG remained stable. He had no arrhythmias. Cardiology was consulted. Initially with diuresis, he made mild improvement but no significant change in his dyspnea. He had an echocardiogram done which showed the normal LV size and LV function and no diastolic or systolic dysfunction. His BNP was less than 30 and cardiology did not think it was secondary to his heart failure and it was thought to be dyspnea and right pleural effusion thought to be secondary to a pulmonary process. Initially, his blood count was limited at 15,000 and all the chemistries were unremarkable. Also to note, his hemoglobin was 10.9 with slightly low MCV and LFTs, chemistries, and renal function and sugar were normal.  TSH was normal. Iron study was done, which showed an iron of 11, TIBC of 28, considered with a mixed picture for iron deficiency. The patient was started on antibiotics with Tequin, and Zithromax was added. He continues to have persistent right pleural effusion and dyspnea. As a part of the work-up, he also had a spiral CT for PE, which was negative and the patient underwent an thoracentesis of the right effusion. A total of 400 cc of exudative fluid was removed. Cultures were negative. Fungal cultures are still pending. Cytology was negative and the fluid was consistent with the exudative effusion. The patient was continued on antibiotics. Pulmonary consulted with suggestion that this is a parapneumonic effusion. Given the history of patients last year esophageal rupture and at that time followed by ENT, had no esophageal symptoms. He did have repeat barium swallow, which was completely normal. Antibiotic was switched to Zosyn IV. The patient has continued to improve clinically and remain afebrile and white count remained normal, and as far as his antibiotic was switched from Zosyn to Augmentin and his dyspnea improved significantly.  #2 - ASTHMA: As far as his asthma, he had mild exacerbation when he came in and with the Pulmicort did have improvement.  #3 - ANEMIA: As far as his anemia, the patient had hemoglobin of 10.9, but on discharge hemoglobin was 12.1. He had one guaiac-positive stool but no evidence of any active GI bleed. He was on aspirin, Lovenox, and heparin, which was stopped. He would need to have a outpatient GI work-up outpatient, i.e., colonoscopy. Also ______ showed he had a PPD placed in  the hospital, which was negative. The plan is he would need to have a chest x-ray in 3-4 weeks to document clearing of his right effusion plus the infiltrate was atelectasis process. He is to have a CBC repeated.  #4 - POOR DENTURES: He will need a dentist to have dental care, which  I have discussed with the patient also. Dictated by:   Tyson Dense, M.D. Attending Physician:  Georgann Housekeeper DD:  07/15/02 TD:  07/21/02 Job: [id number]

## 2011-05-12 NOTE — Discharge Summary (Signed)
[facility]  Patient:    [name] [name], [name] [name]                         MRN: [id number] Adm. Date:  [id number] Disc. Date: [id number] Attending:  Barbee Cough CC:         Barton Fanny, M.D., [facility]                           Discharge Summary  CONSULTATIONS:  Critical care medicine.  PROCEDURES:  Direct laryngoscopy, esophagoscopy. and removal of esophageal foreign body on January 28, 2001.  CONDITION ON DISCHARGE:  The patient is discharged to home in stable condition.  DISCHARGE MEDICATION:  Augmentin elixir 400 mg per 5 cc two tsp p.o. b.i.d. x 10 days.  DISCHARGE INSTRUCTIONS:  Diet:  Soft and liquids as tolerated.  Activity:  No lifting or straining.  No wound care.  DISCHARGE FOLLOWUP:  The patient is scheduled to follow up in my office in 10 days.  He will call for an appointment at [phone].  BRIEF ADMISSION HISTORY:  [name] [name] is a 60 year old black male who presented to the [facility] Emergency Department with a four-day history of progressive dysphagia.  According to the patient he was eating chicken on January 24, 2001, when he began to develop difficulty swallowing. Unfortunately, the patient ignored this problem for the ensuring three to four days before presenting to the emergency department for evaluation.  In the emergency department the patient was found to be febrile, had a white blood cell count of 22.9, and a CT scan showed significant soft tissue fullness and edema in the prevertebral space without evidence of abscess or fluid collection.  There was free air in the right hypopharyngeal soft tissue. Given these findings and the progressive nature of the patients symptoms, he was admitted to [facility] under Dr. Clovis Pu care for evaluation and management of possible retropharyngeal abscess and esophageal perforation.  HOSPITAL COURSE:  The patient was admitted on January 28, 2001, from the [facility] Emergency Department.  He was taken directly to the operating room and intubated successfully despite significant soft tissue edema.  Direct laryngoscopy and cervical esophagoscopy were undertaken.  The patient was found to have an esophageal foreign body consisting of a chicken bone in the proximal thoracic esophagus approximately 5 cm from the esophageal inlet.  The foreign body measured approximately 4 cm in length, and after successful removal the patient was found to esophageal perforation in the lateral aspect of the esophagus.  There were no other mucosa injuries, but he had significant soft tissue edema and considering the significant risk of progressive infection, mediastinitis, and airway collapse, the patient was admitted directly from the operating room to the critical care unit at [facility].  Critical care medicine was consulted for further evaluation and management of the patient while in the intensive care unit.  He remained intubated until February 04, 2001.  The patient was then started on Zosyn intravenous antibiotic 4.5 mg IV q.8h.  A nasogastric tube was passed under direct visualization, and the patients stomach was decompressed.  He was sedated and remained in the intensive care unit intubated through February 15, 2001.  Serial CT scans of the chest and neck were performed in the following days.  The patient continued to have a moderate amount of soft tissue edema in the prevertebral space, but the soft tissue  air resolved, and there was no evidence of abscess formation or mediastinitis.  On February 04, 2001, the patient was stable, no evidence of active infection, white blood cell count was mildly elevated, and the patient was afebrile.  Given findings on CT scan as noted above, the patient was then extubated.  He was monitored for an additional day in the intensive care unit, had no respiratory distress, and was transferred from critical care to  unit 3 [place] for further postoperative evaluation and management.  The patient was monitored on telemetry. Unfortunately, the patient refused all additional blood draws, and there was no final white count obtained.  Last white blood cell count was on the day of his admission, and it was found to be 15.3.  Clinically, the patient improved. He was started on liquid and soft oral diet which he tolerated without difficulty.  He was ambulating well with normal bowel and bladder function, no evidence of active infection.  He was afebrile throughout the remainder of his hospitalization.  He was discharged on hospital day #10 (February 07, 2001) in stable condition.  He had been oral antibiotics, Augmentin 400 mg per 5 cc two tsp b.i.d. for a 36-hour period without worsening of his clinical picture.  CONDITION ON DISCHARGE:  He was discharged to home in stable condition with the above Discharge Instruction.  He will follow up in my office in 10 days for reevaluation and further management. DD:  02/07/01 TD:  02/08/01 Job: [id number]
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
## 2011-09-27 LAB — BASIC METABOLIC PANEL
BUN: 10
BUN: 11
CO2: 27
Calcium: 9.1
Chloride: 103
Chloride: 104
Creatinine, Ser: 1.23
Creatinine, Ser: 1.25
GFR calc Af Amer: >60
Glucose, Bld: 79
Potassium: 3.5

## 2011-09-27 LAB — CBC
HCT: 32.3 — ABNORMAL LOW
MCHC: 31
MCHC: 31.6
MCHC: 32
MCV: 68.3 — ABNORMAL LOW
MCV: 68.5 — ABNORMAL LOW
MCV: 69 — ABNORMAL LOW
Platelets: 380
Platelets: 405 — ABNORMAL HIGH
Platelets: 420 — ABNORMAL HIGH
RBC: 5.31
RDW: 17.5 — ABNORMAL HIGH
WBC: 7.9
WBC: 9.3

## 2011-09-27 LAB — COMPREHENSIVE METABOLIC PANEL
ALT: 13
AST: 19
Albumin: 2.9 — ABNORMAL LOW
Calcium: 8.7
Creatinine, Ser: 1.02
GFR calc Af Amer: >60
GFR calc non Af Amer: >60
Sodium: 138
Total Protein: 7

## 2011-10-02 LAB — URINALYSIS, ROUTINE W REFLEX MICROSCOPIC
Glucose, UA: NEGATIVE
Ketones, ur: NEGATIVE
Nitrite: NEGATIVE
Protein, ur: NEGATIVE
Urobilinogen, UA: 0.2

## 2011-10-02 LAB — POCT I-STAT CREATININE
Creatinine, Ser: 1.2
Operator id: 294521

## 2011-10-02 LAB — DIFFERENTIAL
Lymphocytes Relative: 19
Lymphs Abs: 1.8
Monocytes Relative: 10
Neutrophils Relative %: 67

## 2011-10-02 LAB — CBC
HCT: 35.7 — ABNORMAL LOW
Platelets: 414 — ABNORMAL HIGH
RBC: 5.28
WBC: 9.5

## 2011-10-02 LAB — I-STAT 8, (EC8 V) (CONVERTED LAB)
HCT: 41
Hemoglobin: 13.9
Operator id: 294521
Potassium: 3.9
Sodium: 139
TCO2: 26

## 2011-10-18 ENCOUNTER — Ambulatory Visit (HOSPITAL_COMMUNITY)
Admission: RE | Admit: 2011-10-18 | Discharge: 2011-10-18 | Disposition: A | Discharge status: Home / Self Care | Payer: Self-pay | Source: Ambulatory Visit | Attending: Internal Medicine | Admitting: Internal Medicine

## 2011-10-18 DIAGNOSIS — Z1389 Encounter for screening for other disorder: Secondary | ICD-10-CM | POA: Insufficient documentation

## 2011-10-18 DIAGNOSIS — R259 Unspecified abnormal involuntary movements: Secondary | ICD-10-CM | POA: Insufficient documentation

## 2011-10-18 NOTE — Procedures (Signed)
EEG NUMBER:  HISTORY:  A 60 year old male with episodes of involuntary movements.  MEDICATIONS:  BP meds, quad meds, Advair, albuterol.  CONDITIONS OF RECORDING:  This is a 16 channel EEG carried out the patient in the awake, drowsy, and asleep states.  DESCRIPTION:  The waking background activity consists of a low-voltage, symmetrical, fairly well-organized 9 Hz alpha activity seen from the parietoccipital and posterotemporal regions.  Low-voltage, fast activity, poorly organized was seen anteriorly at times superimposed on more posterior rhythms.  A mixture of theta and alpha was seen from the central and temporal regions.  The patient drowses with slowing to irregular which is theta and beta activity.  The patient goes into a light sleep with symmetrical sleep spindles.  The vertex was sharp activity and irregular slow activity.  Hypoventilation was not performed.  Intermittent photic stimulation elicited a symmetrical driving response, but failed to elicit any abnormalities.  IMPRESSION:  This is a normal EEG.          ______________________________ Thana Farr, MD    [id number] D:  10/18/2011 17:35:41  T:  10/18/2011 19:33:02  Job #:  [id number]

## 2011-12-16 ENCOUNTER — Encounter: Payer: Self-pay | Admitting: *Deleted

## 2011-12-16 ENCOUNTER — Emergency Department (HOSPITAL_COMMUNITY): Payer: Self-pay

## 2011-12-16 ENCOUNTER — Other Ambulatory Visit: Payer: Self-pay

## 2011-12-16 ENCOUNTER — Emergency Department (HOSPITAL_COMMUNITY)
Admission: EM | Admit: 2011-12-16 | Discharge: 2011-12-16 | Disposition: A | Discharge status: Home / Self Care | Payer: Self-pay | Attending: Emergency Medicine | Admitting: Emergency Medicine

## 2011-12-16 DIAGNOSIS — R059 Cough, unspecified: Secondary | ICD-10-CM | POA: Insufficient documentation

## 2011-12-16 DIAGNOSIS — R05 Cough: Secondary | ICD-10-CM | POA: Insufficient documentation

## 2011-12-16 DIAGNOSIS — R0789 Other chest pain: Secondary | ICD-10-CM | POA: Insufficient documentation

## 2011-12-16 DIAGNOSIS — I4891 Unspecified atrial fibrillation: Secondary | ICD-10-CM | POA: Insufficient documentation

## 2011-12-16 DIAGNOSIS — R0989 Other specified symptoms and signs involving the circulatory and respiratory systems: Secondary | ICD-10-CM | POA: Insufficient documentation

## 2011-12-16 DIAGNOSIS — I498 Other specified cardiac arrhythmias: Secondary | ICD-10-CM | POA: Insufficient documentation

## 2011-12-16 DIAGNOSIS — Z7982 Long term (current) use of aspirin: Secondary | ICD-10-CM | POA: Insufficient documentation

## 2011-12-16 DIAGNOSIS — R0609 Other forms of dyspnea: Secondary | ICD-10-CM | POA: Insufficient documentation

## 2011-12-16 DIAGNOSIS — I10 Essential (primary) hypertension: Secondary | ICD-10-CM | POA: Insufficient documentation

## 2011-12-16 DIAGNOSIS — J45909 Unspecified asthma, uncomplicated: Secondary | ICD-10-CM | POA: Insufficient documentation

## 2011-12-16 HISTORY — DX: Essential (primary) hypertension: I10

## 2011-12-16 HISTORY — DX: Unspecified atrial fibrillation: I48.91

## 2011-12-16 LAB — COMPREHENSIVE METABOLIC PANEL
Alkaline Phosphatase: 96 U/L (ref 39–117)
BUN: 9 mg/dL (ref 6–23)
Chloride: 101 mEq/L (ref 96–112)
GFR calc Af Amer: >90 mL/min (ref >90)
GFR calc non Af Amer: 87 mL/min — ABNORMAL LOW (ref >90)
Glucose, Bld: 110 mg/dL — ABNORMAL HIGH (ref 70–99)
Potassium: 3.8 mEq/L (ref 3.5–5.1)
Total Bilirubin: 0.2 mg/dL — ABNORMAL LOW (ref 0.3–1.2)
Total Protein: 7.9 g/dL (ref 6.0–8.3)

## 2011-12-16 LAB — CARDIAC PANEL(CRET KIN+CKTOT+MB+TROPI): Relative Index: INVALID (ref 0.0–2.5)

## 2011-12-16 LAB — CBC
HCT: 36.4 % — ABNORMAL LOW (ref 39.0–52.0)
Hemoglobin: 11.4 g/dL — ABNORMAL LOW (ref 13.0–17.0)
MCHC: 31.3 g/dL (ref 30.0–36.0)

## 2011-12-16 LAB — TROPONIN I: Troponin I: <0.3 ng/mL (ref <0.30)

## 2011-12-16 LAB — DIFFERENTIAL
Basophils Relative: 0 % (ref 0–1)
Monocytes Absolute: 0.6 10*3/uL (ref 0.1–1.0)
Monocytes Relative: 6 % (ref 3–12)
Neutro Abs: 7.4 10*3/uL (ref 1.7–7.7)

## 2011-12-16 MED ORDER — ALBUTEROL SULFATE (5 MG/ML) 0.5% IN NEBU
5.0000 mg | INHALATION_SOLUTION | Freq: Once | RESPIRATORY_TRACT | Status: AC
  Administered 2011-12-16: 5 mg via RESPIRATORY_TRACT
  Filled 2011-12-16: qty 1

## 2011-12-16 MED ORDER — IPRATROPIUM BROMIDE 0.02 % IN SOLN
0.5000 mg | Freq: Once | RESPIRATORY_TRACT | Status: AC
  Administered 2011-12-16: 0.5 mg via RESPIRATORY_TRACT
  Filled 2011-12-16: qty 2.5

## 2011-12-16 MED ORDER — SODIUM CHLORIDE 0.9 % IV SOLN
Freq: Once | INTRAVENOUS | Status: AC
  Administered 2011-12-16: 16:00:00 via INTRAVENOUS

## 2011-12-16 MED ORDER — XENON XE 133 GAS
10.0000 | GAS_FOR_INHALATION | Freq: Once | RESPIRATORY_TRACT | Status: AC | PRN
  Administered 2011-12-16: 10 via RESPIRATORY_TRACT

## 2011-12-16 MED ORDER — TECHNETIUM TO 99M ALBUMIN AGGREGATED
6.0000 | Freq: Once | INTRAVENOUS | Status: AC | PRN
  Administered 2011-12-16: 6 via INTRAVENOUS

## 2011-12-16 NOTE — ED Notes (Signed)
Returned from xray

## 2011-12-16 NOTE — ED Notes (Signed)
Per IV team RN's and Nile Dear, RN, pt has been refusing IV.  Pt states now is in agreement.

## 2011-12-16 NOTE — ED Notes (Signed)
Iv team at bedside  

## 2011-12-16 NOTE — ED Notes (Signed)
Arrived by [facility] for chest pain this am. Received 1 nitro and 4 baby asa pta.

## 2011-12-16 NOTE — ED Notes (Signed)
Pt refuses to allow iv attempts, calling iv team

## 2011-12-16 NOTE — ED Notes (Signed)
CT advised they are aware of VQ scan order and have called in appropriate personnel.

## 2011-12-16 NOTE — ED Provider Notes (Signed)
History     CSN: [id number]  Arrival date & time 12/16/11  1048   First MD Initiated Contact with Patient 12/16/11 1132      Chief Complaint  Patient presents with  . Chest Pain    (Consider location/radiation/quality/duration/timing/severity/associated sxs/prior treatment) Patient is a 60 y.o. male presenting with chest pain. The history is provided by the patient.  Chest Pain    patient presents with dyspnea x24 hours. Some cough has been nonproductive. Patient has had some associated atypical chest tightness however denies any exertional chest pain or diaphoresis. Patient denies any orthopnea or dyspnea on exertion. He felt better at this time. Nothing makes the symptoms better or worse. Denies any syncope or palpitations. No vomiting or fever or diarrhea  Past Medical History  Diagnosis Date  . Hypertension   . Asthma   . Atrial fibrillation     History reviewed. No pertinent past surgical history.  History reviewed. No pertinent family history.  History  Substance Use Topics  . Smoking status: Not on file  . Smokeless tobacco: Not on file  . Alcohol Use:       Review of Systems  Cardiovascular: Positive for chest pain.  All other systems reviewed and are negative.    Allergies  Review of patient's allergies indicates no known allergies.  Home Medications   Current Outpatient Rx  Name Route Sig Dispense Refill  . ASPIRIN EC 81 MG PO TBEC Oral Take 81 mg by mouth daily.        BP 141/77  Pulse 53  Temp(Src) 97.8 F (36.6 C) (Oral)  Resp 18  SpO2 99%  Physical Exam  Nursing note and vitals reviewed. Constitutional: He is oriented to person, place, and time. He appears well-developed and well-nourished.  Non-toxic appearance. No distress.  HENT:  Head: Normocephalic and atraumatic.  Eyes: Conjunctivae, EOM and lids are normal. Pupils are equal, round, and reactive to light.  Neck: Normal range of motion. Neck supple. No tracheal deviation  present. No mass present.  Cardiovascular: Normal heart sounds.  An irregularly irregular rhythm present. Bradycardia present.  Exam reveals no gallop.   No murmur heard. Pulmonary/Chest: Effort normal and breath sounds normal. No stridor. No respiratory distress. He has no decreased breath sounds. He has no wheezes. He has no rhonchi. He has no rales.  Abdominal: Soft. Normal appearance and bowel sounds are normal. He exhibits no distension. There is no tenderness. There is no rebound and no CVA tenderness.  Musculoskeletal: Normal range of motion. He exhibits no edema and no tenderness.  Neurological: He is alert and oriented to person, place, and time. He has normal strength. No cranial nerve deficit or sensory deficit. GCS eye subscore is 4. GCS verbal subscore is 5. GCS motor subscore is 6.  Skin: Skin is warm and dry. No abrasion and no rash noted.  Psychiatric: He has a normal mood and affect. His speech is normal and behavior is normal.    ED Course  Procedures (including critical care time)   Labs Reviewed  CARDIAC PANEL(CRET KIN+CKTOT+MB+TROPI)  CBC  DIFFERENTIAL  D-DIMER, QUANTITATIVE  COMPREHENSIVE METABOLIC PANEL   No results found.   No diagnosis found.    MDM  patient presented with dyspnea initially. Patient with elevated d-dimer at this time. VQ scan has been ordered due to difficult IV access. Patient discussed with Dr. Effie Shy and patient will be sent to the CDU to await his results. Patient instructed that if this study is negative he  will be discharged home        Toy Baker, MD 12/16/11 1527

## 2011-12-16 NOTE — ED Notes (Signed)
Pt arrived to CDU #2 via stretcher from radiology.  REport received from Vesta Mixer, Charity fundraiser.  IV team RN's w/pt - attempting to perform IV access and draw Troponin.

## 2011-12-16 NOTE — ED Provider Notes (Signed)
Report received from Dr. Lorre Nick. This is a 60 year old gentleman presenting with atypical chest pain. Patient has an elevated d-dimer result. Patient is currently awaiting for a VQ scan to rule out PE. Patient will also await second set of cardiac markers. Patient will be discharged if results are negative.  5:50 PM The V/Q scan shows no obvious evidence of a PE. A second set of troponin is negative. Therefore, patient is instructed to followup with his primary care Dr. for further management. He is currently in no acute distress, and his vital signs stable.  Fayrene Helper, PA 12/16/11 [phone]
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
## 2011-12-16 NOTE — ED Notes (Signed)
SW on phone w/pt.

## 2011-12-16 NOTE — ED Notes (Signed)
SW advised to have Consulting civil engineer to contact [facility] for cab voucher as per Fayrene Helper, PA, request.

## 2011-12-17 NOTE — ED Provider Notes (Signed)
Medical screening examination/treatment/procedure(s) were performed by non-physician practitioner and as supervising physician I was immediately available for consultation/collaboration.  Flint Melter, MD 12/17/11 [phone]

## 2012-03-18 ENCOUNTER — Other Ambulatory Visit (HOSPITAL_COMMUNITY): Payer: Self-pay | Admitting: Cardiology

## 2012-03-22 ENCOUNTER — Encounter (HOSPITAL_COMMUNITY)
Admission: RE | Admit: 2012-03-22 | Discharge: 2012-03-22 | Disposition: A | Discharge status: Home / Self Care | Payer: Self-pay | Source: Ambulatory Visit | Attending: Cardiology | Admitting: Cardiology

## 2012-03-22 ENCOUNTER — Encounter (HOSPITAL_COMMUNITY)
Admission: RE | Admit: 2012-03-22 | Discharge: 2012-03-22 | Disposition: A | Discharge status: Home / Self Care | Payer: No Typology Code available for payment source | Source: Ambulatory Visit | Attending: Cardiology | Admitting: Cardiology

## 2012-03-22 ENCOUNTER — Ambulatory Visit (HOSPITAL_COMMUNITY)
Admission: RE | Admit: 2012-03-22 | Discharge: 2012-03-22 | Disposition: A | Discharge status: Home / Self Care | Payer: Self-pay | Source: Ambulatory Visit | Attending: Cardiology | Admitting: Cardiology

## 2012-03-22 DIAGNOSIS — R079 Chest pain, unspecified: Secondary | ICD-10-CM | POA: Insufficient documentation

## 2012-03-22 MED ORDER — REGADENOSON 0.4 MG/5ML IV SOLN
INTRAVENOUS | Status: AC
  Administered 2012-03-22: 0.4 mg via INTRAVENOUS
  Filled 2012-03-22: qty 5

## 2012-03-22 MED ORDER — TECHNETIUM TC 99M TETROFOSMIN IV KIT
30.0000 | PACK | Freq: Once | INTRAVENOUS | Status: AC | PRN
  Administered 2012-03-22: 30 via INTRAVENOUS

## 2012-03-22 MED ORDER — TECHNETIUM TC 99M TETROFOSMIN IV KIT
10.0000 | PACK | Freq: Once | INTRAVENOUS | Status: AC | PRN
  Administered 2012-03-22: 10 via INTRAVENOUS

## 2012-12-13 ENCOUNTER — Emergency Department (HOSPITAL_COMMUNITY)
Admission: EM | Admit: 2012-12-13 | Discharge: 2012-12-13 | Disposition: A | Discharge status: Home / Self Care | Payer: No Typology Code available for payment source | Source: Home / Self Care

## 2012-12-13 ENCOUNTER — Encounter (HOSPITAL_COMMUNITY): Payer: Self-pay

## 2012-12-13 DIAGNOSIS — I10 Essential (primary) hypertension: Secondary | ICD-10-CM

## 2012-12-13 DIAGNOSIS — B356 Tinea cruris: Secondary | ICD-10-CM

## 2012-12-13 DIAGNOSIS — K219 Gastro-esophageal reflux disease without esophagitis: Secondary | ICD-10-CM

## 2012-12-13 MED ORDER — KETOCONAZOLE 2 % EX CREA: TOPICAL_CREAM | Freq: Every day | CUTANEOUS | Status: DC

## 2012-12-13 NOTE — ED Provider Notes (Signed)
History     CSN: [id number]  Arrival date & time 12/13/12  1124  Chief Complaint  Patient presents with  . Medication Refill   HPI Pt says that he has been under a lot of stress lately and reports that he has been having a lot of dry skin and eczema.     Past Medical History  Diagnosis Date  . Hypertension   . Asthma   . Atrial fibrillation     History reviewed. No pertinent past surgical history.  No family history on file.  History  Substance Use Topics  . Smoking status: Not on file  . Smokeless tobacco: Not on file  . Alcohol Use:     Review of Systems  Constitutional: Negative.   HENT: Negative.   Eyes: Negative.   Respiratory: Negative.   Cardiovascular: Negative.   Gastrointestinal: Negative.   Musculoskeletal:       Dry scaly skin noted  Neurological: Negative.   Hematological: Negative.   Psychiatric/Behavioral: Negative.     Allergies  Review of patient's allergies indicates no known allergies.  Home Medications   Current Outpatient Rx  Name  Route  Sig  Dispense  Refill  . AMIODARONE HCL 200 MG PO TABS   Oral   Take 200 mg by mouth daily.         Marland Kitchen FERROUS SULFATE 325 (65 FE) MG PO TABS   Oral   Take 325 mg by mouth daily with breakfast.         . LISINOPRIL 10 MG PO TABS   Oral   Take 10 mg by mouth daily.         Marland Kitchen PANTOPRAZOLE SODIUM 40 MG PO TBEC   Oral   Take 40 mg by mouth daily.         . ALBUTEROL SULFATE HFA 108 (90 BASE) MCG/ACT IN AERS   Inhalation   Inhale 2 puffs into the lungs every 6 (six) hours as needed.           . ASPIRIN EC 81 MG PO TBEC   Oral   Take 81 mg by mouth daily.           Marland Kitchen FLUTICASONE-SALMETEROL 115-21 MCG/ACT IN AERO   Inhalation   Inhale 2 puffs into the lungs as needed.             BP 144/79  Pulse 62  Temp 97.6 F (36.4 C) (Oral)  Resp 20  SpO2 100%  Physical Exam  Nursing note and vitals reviewed. Constitutional: He is oriented to person, place, and time. He  appears well-developed and well-nourished. No distress.  HENT:  Head: Normocephalic and atraumatic.  Eyes: EOM are normal. Pupils are equal, round, and reactive to light.  Neck: Normal range of motion. Neck supple.  Cardiovascular: Normal rate, regular rhythm and normal heart sounds.   Abdominal: Soft. Bowel sounds are normal.  Neurological: He is alert and oriented to person, place, and time.  Skin: Skin is warm and dry.       Dry scaly skin There is a candidal infection under the pannus of the abdomen  Psychiatric: He has a normal mood and affect. His behavior is normal. Judgment and thought content normal.    ED Course  Procedures (including critical care time)  Labs Reviewed - No data to display No results found.  No diagnosis found.  MDM  IMPRESSION  Hypertension, suboptimally controlled   GERD  Candidal Intertrigo  RECOMMENDATIONS / PLAN  Trial ketoconazole 2% creme Monitor BP closely recheck in 2 weeks RTC for labs in 2 weeks (pt request)  FOLLOW UP 3 months for follow up visit  The patient was given clear instructions to go to ER or return to medical center if symptoms don't improve, worsen or new problems develop.  The patient verbalized understanding.  The patient was told to call to get lab results if they haven't heard anything in the next week.            Cleora Fleet, MD 12/13/12 [phone]

## 2012-12-13 NOTE — ED Notes (Signed)
[facility] client-here for medication refill and check up

## 2012-12-31 ENCOUNTER — Emergency Department (INDEPENDENT_AMBULATORY_CARE_PROVIDER_SITE_OTHER)
Admission: EM | Admit: 2012-12-31 | Discharge: 2012-12-31 | Disposition: A | Discharge status: Home / Self Care | Payer: Self-pay | Source: Home / Self Care

## 2012-12-31 ENCOUNTER — Encounter (HOSPITAL_COMMUNITY): Payer: Self-pay

## 2012-12-31 DIAGNOSIS — B356 Tinea cruris: Secondary | ICD-10-CM

## 2012-12-31 DIAGNOSIS — K219 Gastro-esophageal reflux disease without esophagitis: Secondary | ICD-10-CM

## 2012-12-31 DIAGNOSIS — I10 Essential (primary) hypertension: Secondary | ICD-10-CM

## 2012-12-31 LAB — CBC
HCT: 35.1 % — ABNORMAL LOW (ref 39.0–52.0)
MCHC: 31.6 g/dL (ref 30.0–36.0)
Platelets: 328 10*3/uL (ref 150–400)
RDW: 15.9 % — ABNORMAL HIGH (ref 11.5–15.5)

## 2012-12-31 LAB — HEMOGLOBIN A1C: Hgb A1c MFr Bld: 5.9 % — ABNORMAL HIGH (ref <5.7)

## 2012-12-31 LAB — COMPREHENSIVE METABOLIC PANEL
AST: 25 U/L (ref 0–37)
Albumin: 3.4 g/dL — ABNORMAL LOW (ref 3.5–5.2)
Alkaline Phosphatase: 99 U/L (ref 39–117)
BUN: 10 mg/dL (ref 6–23)
Creatinine, Ser: 1.05 mg/dL (ref 0.50–1.35)
Potassium: 3.7 mEq/L (ref 3.5–5.1)
Total Protein: 8.3 g/dL (ref 6.0–8.3)

## 2012-12-31 LAB — CHOLESTEROL, TOTAL: Cholesterol: 161 mg/dL (ref 0–200)

## 2012-12-31 NOTE — ED Notes (Signed)
Lab work- cbc.cmp,lipid and Hgb A1C drawn

## 2013-01-01 NOTE — Progress Notes (Signed)
Quick Note:  Please notify patient that his labs came back OK except that his hemoglobin was a little low. Please ask patient if he has had colonoscopy test done recently. If he has not we can set him up for a screening colonoscopy. Send patient some home hemoccult cards in the mail and have him bring them back to [place] for processing. Please tell patient that his blood sugar test suggests that he has prediabetes. Please print some patient information on prediabetes and send it to him in the mail. Have patient return to get a BP check with the nurse in 1 month. Recommend rechecking labs in 3 or 4 months.    Rodney Langton, MD, CDE, FAAFP [facility] [place], [place]   ______

## 2013-01-02 ENCOUNTER — Telehealth (HOSPITAL_COMMUNITY): Payer: Self-pay

## 2013-01-02 NOTE — Telephone Encounter (Signed)
Message copied by Lestine Mount on Thu Jan 02, 2013 12:52 PM ------      Message from: Cleora Fleet      Created: Wed Jan 01, 2013 10:22 AM       Please notify patient that his labs came back OK except that his hemoglobin was a little low.  Please ask patient if he has had colonoscopy test done recently.  If he has not we can set him up for a screening colonoscopy.   Send patient some home hemoccult cards in the mail and have him bring them back to [place] for processing.   Please tell patient that his blood sugar test suggests that he has prediabetes.  Please print some patient information on prediabetes and send it to him in the mail.  Have patient return to get a BP check with the nurse in 1 month.  Recommend rechecking labs in 3 or 4 months.                   Rodney Langton, MD, CDE, FAAFP      [facility]      [facility]      [place], [place]

## 2013-03-21 ENCOUNTER — Emergency Department (INDEPENDENT_AMBULATORY_CARE_PROVIDER_SITE_OTHER)
Admission: EM | Admit: 2013-03-21 | Discharge: 2013-03-21 | Disposition: A | Discharge status: Home / Self Care | Payer: Self-pay | Source: Home / Self Care | Attending: Family Medicine | Admitting: Family Medicine

## 2013-03-21 ENCOUNTER — Encounter (HOSPITAL_COMMUNITY): Payer: Self-pay

## 2013-03-21 DIAGNOSIS — I495 Sick sinus syndrome: Secondary | ICD-10-CM | POA: Diagnosis present

## 2013-03-21 DIAGNOSIS — G4730 Sleep apnea, unspecified: Secondary | ICD-10-CM

## 2013-03-21 DIAGNOSIS — K219 Gastro-esophageal reflux disease without esophagitis: Secondary | ICD-10-CM

## 2013-03-21 DIAGNOSIS — I4891 Unspecified atrial fibrillation: Secondary | ICD-10-CM

## 2013-03-21 NOTE — ED Notes (Signed)
Patient has some questions for the doctor and some paper work  He needs filled out

## 2013-03-21 NOTE — ED Provider Notes (Signed)
History     CSN: [id number]  Arrival date & time 03/21/13  1625   First MD Initiated Contact with Patient 03/21/13 1745      Chief Complaint  Patient presents with  . Follow-up    (Consider location/radiation/quality/duration/timing/severity/associated sxs/prior treatment) HPI Pt is upset because we asked him to come in to be seen before we could complete disability forms for him.  I reminded him that he was a new patient to me and I have never seen him for a disability evaluation and I needed to talk to him and discuss what his limitations are and what his ADL limitations are.  He is seeking disability to be forgiven for student loans that he had taken years ago and never paid and now the interest has compounded and he owes more than $3000.  He reports that he is disabled because of his sick sinus syndrome and asthma and chronic fatigue.  He is followed closely by cardiology and is on high risk medications including amiodarone.  He has limitations in mobility and also limitations with his strength and ability to perform ADLs because of asthma and shortness of breath.    Past Medical History  Diagnosis Date  . Hypertension   . Asthma   . Atrial fibrillation     History reviewed. No pertinent past surgical history.  No family history on file.  History  Substance Use Topics  . Smoking status: Not on file  . Smokeless tobacco: Not on file  . Alcohol Use:     Review of Systems  Respiratory: Positive for cough, chest tightness, shortness of breath and wheezing.   Neurological: Positive for weakness.  All other systems reviewed and are negative.    Allergies  Review of patient's allergies indicates no known allergies.  Home Medications   Current Outpatient Rx  Name  Route  Sig  Dispense  Refill  . albuterol (PROVENTIL HFA;VENTOLIN HFA) 108 (90 BASE) MCG/ACT inhaler   Inhalation   Inhale 2 puffs into the lungs every 6 (six) hours as needed.           Marland Kitchen amiodarone  (PACERONE) 200 MG tablet   Oral   Take 200 mg by mouth daily.         Marland Kitchen aspirin EC 81 MG tablet   Oral   Take 81 mg by mouth daily.           . ferrous sulfate 325 (65 FE) MG tablet   Oral   Take 325 mg by mouth daily with breakfast.         . fluticasone-salmeterol (ADVAIR HFA) 115-21 MCG/ACT inhaler   Inhalation   Inhale 2 puffs into the lungs as needed.           Marland Kitchen ketoconazole (NIZORAL) 2 % cream   Topical   Apply topically daily. Apply to rash twice per day   60 g   0   . lisinopril (PRINIVIL,ZESTRIL) 10 MG tablet   Oral   Take 10 mg by mouth daily.         . pantoprazole (PROTONIX) 40 MG tablet   Oral   Take 40 mg by mouth daily.           BP 146/80  Pulse 73  Temp(Src) 98.4 F (36.9 C) (Oral)  SpO2 100%  Physical Exam  Nursing note and vitals reviewed. Constitutional: He is oriented to person, place, and time. He appears well-developed and well-nourished. No distress.  HENT:  Head: Atraumatic.  Neck: Normal range of motion. Neck supple.  Cardiovascular: Normal heart sounds.   Pulmonary/Chest: Effort normal and breath sounds normal.  Abdominal: Soft. Bowel sounds are normal.  Musculoskeletal: Normal range of motion.  Neurological: He is alert and oriented to person, place, and time.  Skin: Skin is warm and dry.  Psychiatric: He has a normal mood and affect. His behavior is normal. Judgment and thought content normal.    ED Course  Procedures (including critical care time)  Labs Reviewed - No data to display No results found.   No diagnosis found.    MDM  IMPRESSION  ASthma  Sick Sinus Syndrome  High Risk Medications  Hypertension  RECOMMENDATIONS / PLAN I spent 25 mins with patient today with majority of time spent with counseling and coordination of his care.  I completed his forms and copied them and sent them to be scanned into [facility]  FOLLOW UP Next week for his scheduled visit  The patient was given clear  instructions to go to ER or return to medical center if symptoms don't improve, worsen or new problems develop.  The patient verbalized understanding.  The patient was told to call to get lab results if they haven't heard anything in the next week.            Cleora Fleet, MD 03/22/13 1019

## 2013-03-25 ENCOUNTER — Emergency Department (INDEPENDENT_AMBULATORY_CARE_PROVIDER_SITE_OTHER)
Admission: EM | Admit: 2013-03-25 | Discharge: 2013-03-25 | Disposition: A | Discharge status: Home / Self Care | Payer: Self-pay | Source: Home / Self Care

## 2013-03-25 DIAGNOSIS — K219 Gastro-esophageal reflux disease without esophagitis: Secondary | ICD-10-CM

## 2013-03-25 DIAGNOSIS — I4891 Unspecified atrial fibrillation: Secondary | ICD-10-CM

## 2013-03-25 DIAGNOSIS — G4730 Sleep apnea, unspecified: Secondary | ICD-10-CM

## 2013-03-25 LAB — COMPREHENSIVE METABOLIC PANEL
AST: 19 U/L (ref 0–37)
Alkaline Phosphatase: 96 U/L (ref 39–117)
BUN: 15 mg/dL (ref 6–23)
Calcium: 9.2 mg/dL (ref 8.4–10.5)
GFR calc non Af Amer: 72 mL/min — ABNORMAL LOW (ref >90)
Glucose, Bld: 82 mg/dL (ref 70–99)
Sodium: 137 mEq/L (ref 135–145)
Total Bilirubin: 0.3 mg/dL (ref 0.3–1.2)

## 2013-03-25 LAB — CBC
Hemoglobin: 11.3 g/dL — ABNORMAL LOW (ref 13.0–17.0)
MCH: 21 pg — ABNORMAL LOW (ref 26.0–34.0)
MCHC: 32.2 g/dL (ref 30.0–36.0)
RDW: 16 % — ABNORMAL HIGH (ref 11.5–15.5)

## 2013-03-25 LAB — LIPID PANEL
LDL Cholesterol: 103 mg/dL — ABNORMAL HIGH (ref 0–99)
Triglycerides: 97 mg/dL (ref <150)

## 2013-03-25 LAB — HEMOGLOBIN A1C
Hgb A1c MFr Bld: 6.1 % — ABNORMAL HIGH (ref <5.7)
Mean Plasma Glucose: 128 mg/dL — ABNORMAL HIGH (ref <117)

## 2013-03-25 NOTE — ED Notes (Signed)
Here for lab work only Cbc cmp Lipid hgb a1c

## 2013-03-31 ENCOUNTER — Telehealth (HOSPITAL_COMMUNITY): Payer: Self-pay

## 2013-03-31 NOTE — ED Notes (Signed)
Lab results given

## 2013-06-25 ENCOUNTER — Ambulatory Visit: Payer: Self-pay | Attending: Family Medicine

## 2013-06-25 LAB — LIPID PANEL
Cholesterol: 144 mg/dL (ref 0–200)
Triglycerides: 102 mg/dL (ref <150)

## 2013-06-25 LAB — HEPATIC FUNCTION PANEL
ALT: 11 U/L (ref 0–53)
AST: 20 U/L (ref 0–37)
Alkaline Phosphatase: 77 U/L (ref 39–117)
Indirect Bilirubin: 0.3 mg/dL (ref 0.0–0.9)
Total Protein: 7.3 g/dL (ref 6.0–8.3)

## 2013-06-25 LAB — BASIC METABOLIC PANEL
BUN: 9 mg/dL (ref 6–23)
Creat: 1.17 mg/dL (ref 0.50–1.35)
Glucose, Bld: 80 mg/dL (ref 70–99)

## 2013-06-25 NOTE — Progress Notes (Unsigned)
Pt here for blood work. C/o eczema flare up bilat groin area. Using prescribed cream.

## 2013-07-30 ENCOUNTER — Ambulatory Visit: Payer: Self-pay

## 2013-08-11 ENCOUNTER — Ambulatory Visit: Payer: Self-pay

## 2013-08-20 ENCOUNTER — Other Ambulatory Visit: Payer: Self-pay | Admitting: Internal Medicine

## 2013-08-20 DIAGNOSIS — J45909 Unspecified asthma, uncomplicated: Secondary | ICD-10-CM

## 2013-08-20 NOTE — Telephone Encounter (Signed)
Pt would like a refill on script albuterol (PROVENTIL HFA;VENTOLIN HFA) 108 (90 BASE) MCG/ACT inhaler; Pt uses [facility] on [address]

## 2013-08-21 ENCOUNTER — Other Ambulatory Visit: Payer: Self-pay | Admitting: Emergency Medicine

## 2013-08-21 MED ORDER — ALBUTEROL SULFATE HFA 108 (90 BASE) MCG/ACT IN AERS: 2.0000 | INHALATION_SPRAY | Freq: Four times a day (QID) | RESPIRATORY_TRACT | Status: DC | PRN

## 2013-08-21 NOTE — Telephone Encounter (Signed)
Informed pt she can pick Albuterol rx up @  [place] [facility]

## 2013-09-01 ENCOUNTER — Encounter: Payer: Self-pay | Admitting: Internal Medicine

## 2013-09-01 ENCOUNTER — Ambulatory Visit: Payer: BC Managed Care – PPO | Attending: Family Medicine | Admitting: Internal Medicine

## 2013-09-01 VITALS — BP 162/93 | HR 64 | Temp 98.1°F | Resp 18 | Wt 251.2 lb

## 2013-09-01 DIAGNOSIS — R21 Rash and other nonspecific skin eruption: Secondary | ICD-10-CM

## 2013-09-01 NOTE — Progress Notes (Unsigned)
Patient ID: [name] [name], male   DOB: 1951/02/05, 62 y.o.   MRN: [id number]  Patient Demographics  [name] [name], is a 62 y.o. male  WUJ:[id number]  [id number]  DOB - Jul 12, 1951  Chief Complaint  Patient presents with  . Rash        Subjective:   [name] [name] with History of chronic groin/scrotal/inguinal rash which is been present for about 4 years for which he is been taking ketoconazole without much benefit, also has history of atrial fibrillation, hypertension, sick sinus syndrome and GERD  is here for getting her referred to dermatology. No other subjective complaints.  Denies any subjective complaints except as above, no active headache, no chest abdominal pain at this time, not short of breath. No focal weakness which is new.    Objective:    Patient Active Problem List   Diagnosis Date Noted  . Sick sinus syndrome 03/21/2013  . TINEA CRURIS 09/30/2010  . HYPOGONADOTROPIC HYPOGONADISM 09/23/2010  . GERD 09/01/2010  . DIZZINESS 09/01/2010  . CHEST PAIN UNSPECIFIED 09/01/2010  . SLEEP APNEA 08/20/2010  . FATIGUE 06/28/2010  . TREMOR 06/28/2010  . VITAMIN D DEFICIENCY 03/25/2010  . OTHER TESTICULAR HYPOFUNCTION 01/20/2010  . ERECTILE DYSFUNCTION 11/26/2008  . ESSENTIAL HYPERTENSION, BENIGN 11/26/2008  . FOLATE-DEFICIENCY ANEMIA 09/24/2008  . IRON DEFIC ANEMIA SEC DIET IRON INTAKE 09/21/2008  . ATRIAL FIBRILLATION 09/21/2008  . SICK SINUS SYNDROME 09/21/2008  . ASTHMA, UNSPECIFIED, UNSPECIFIED STATUS 09/21/2008  . ESOPHAGITIS 08/26/2008  . ESOPHAGEAL MOTILITY DISORDER 08/26/2008  . GASTRITIS, ACUTE 08/26/2008     Filed Vitals:   09/01/13 1040  BP: 162/93  Pulse: 64  Temp: 98.1 F (36.7 C)  Resp: 18  Weight: 251 lb 3.2 oz (113.944 kg)  SpO2: 100%     Exam   Awake Alert, Oriented X 3, No new F.N deficits, Normal affect [place].AT,PERRAL Supple Neck,No JVD, No cervical lymphadenopathy appriciated.  Symmetrical Chest wall movement, Good air movement  bilaterally, CTAB RRR,No Gallops,Rubs or new Murmurs, No Parasternal Heave +ve B.Sounds, Abd Soft, Non tender, No organomegaly appriciated, No rebound - guarding or rigidity. No Cyanosis, Clubbing or edema, No new Rash or bruise  except in the groin/scrotal and abdominal pannus area there is a vesicular rash with mild clear discharge, some underlying discoloration,    Data Review   CBC No results found for this basename: WBC, HGB, HCT, PLT, MCV, MCH, MCHC, RDW, NEUTRABS, LYMPHSABS, MONOABS, EOSABS, BASOSABS, BANDABS, BANDSABD,  in the last 168 hours  Chemistries   No results found for this basename: NA, K, CL, CO2, GLUCOSE, BUN, CREATININE, GFRCGP, CALCIUM, MG, AST, ALT, ALKPHOS, BILITOT,  in the last 168 hours ------------------------------------------------------------------------------------------------------------------ No results found for this basename: HGBA1C,  in the last 72 hours ------------------------------------------------------------------------------------------------------------------ No results found for this basename: CHOL, HDL, LDLCALC, TRIG, CHOLHDL, LDLDIRECT,  in the last 72 hours ------------------------------------------------------------------------------------------------------------------ No results found for this basename: TSH, T4TOTAL, FREET3, T3FREE, THYROIDAB,  in the last 72 hours ------------------------------------------------------------------------------------------------------------------ No results found for this basename: VITAMINB12, FOLATE, FERRITIN, TIBC, IRON, RETICCTPCT,  in the last 72 hours  Coagulation profile  No results found for this basename: INR, PROTIME,  in the last 168 hours     Prior to Admission medications   Medication Sig Start Date End Date Taking? Authorizing Provider  albuterol (PROVENTIL HFA;VENTOLIN HFA) 108 (90 BASE) MCG/ACT inhaler Inhale 2 puffs into the lungs every 6 (six) hours as needed. 08/21/13   Jeanann Lewandowsky, MD  amiodarone (PACERONE) 200 MG tablet Take 200 mg  by mouth daily.    Historical Provider, MD  aspirin EC 81 MG tablet Take 81 mg by mouth daily.      Historical Provider, MD  ferrous sulfate 325 (65 FE) MG tablet Take 325 mg by mouth daily with breakfast.    Historical Provider, MD  fluticasone-salmeterol (ADVAIR HFA) 115-21 MCG/ACT inhaler Inhale 2 puffs into the lungs as needed.      Historical Provider, MD  ketoconazole (NIZORAL) 2 % cream Apply topically daily. Apply to rash twice per day 12/13/12   Clanford Cyndie Mull, MD  lisinopril (PRINIVIL,ZESTRIL) 10 MG tablet Take 10 mg by mouth daily.    Historical Provider, MD  pantoprazole (PROTONIX) 40 MG tablet Take 40 mg by mouth daily.    Historical Provider, MD     Assessment & Plan    Groin and scrotal rash with erythema discoloration and some vesicles, rash has been there for about 4 years now gradually getting worse, no help with topical antifungal cream, urgent referral to dermatology made.   Atrial fibrillation, hypertension and GERD. Stable continue home medications which include amiodarone, aspirin, lisinopril and PPI, he follows with Dr. Sharyn Lull his cardiologist on a regular basis.     Routine health maintenance.  Screening labs. CBC, CMP,  lipid panel recently done noted, ordered baseline TSH and A1c  Colonoscopy - referral made    Immunizations flu and tetanus shot       Leroy Sea M.D on 09/01/2013 at 10:49 AM

## 2013-09-01 NOTE — Progress Notes (Unsigned)
Patient here for rash in his genital area Needs referral for colonoscopy

## 2013-09-02 LAB — TSH: TSH: 1.311 u[IU]/mL (ref 0.350–4.500)

## 2013-09-15 ENCOUNTER — Ambulatory Visit: Payer: BC Managed Care – PPO | Attending: Internal Medicine | Admitting: Internal Medicine

## 2013-09-15 ENCOUNTER — Encounter: Payer: Self-pay | Admitting: Internal Medicine

## 2013-09-15 VITALS — BP 162/73 | HR 68 | Temp 98.5°F | Resp 18 | Wt 256.0 lb

## 2013-09-15 DIAGNOSIS — I10 Essential (primary) hypertension: Secondary | ICD-10-CM

## 2013-09-15 DIAGNOSIS — J45909 Unspecified asthma, uncomplicated: Secondary | ICD-10-CM

## 2013-09-15 DIAGNOSIS — I4891 Unspecified atrial fibrillation: Secondary | ICD-10-CM | POA: Insufficient documentation

## 2013-09-15 DIAGNOSIS — Z79899 Other long term (current) drug therapy: Secondary | ICD-10-CM | POA: Insufficient documentation

## 2013-09-15 DIAGNOSIS — Z09 Encounter for follow-up examination after completed treatment for conditions other than malignant neoplasm: Secondary | ICD-10-CM | POA: Insufficient documentation

## 2013-09-15 DIAGNOSIS — R21 Rash and other nonspecific skin eruption: Secondary | ICD-10-CM | POA: Insufficient documentation

## 2013-09-15 MED ORDER — ALBUTEROL SULFATE HFA 108 (90 BASE) MCG/ACT IN AERS: 2.0000 | INHALATION_SPRAY | Freq: Four times a day (QID) | RESPIRATORY_TRACT | Status: DC | PRN

## 2013-09-15 MED ORDER — DIMETHICONE 5 % EX CREA: 1.0000 | TOPICAL_CREAM | Freq: Three times a day (TID) | CUTANEOUS | Status: DC | PRN

## 2013-09-15 NOTE — Addendum Note (Signed)
Addended by: Dorothea Ogle on: 09/15/2013 11:39 AM   Modules accepted: Orders

## 2013-09-15 NOTE — Progress Notes (Signed)
Patient ID: [name] [name], male   DOB: 11-04-1951, 62 y.o.   MRN: [id number]   CC: Followup  HPI: Patient is 62 year old male who presents to clinic for followup. He was seen in the clinic several weeks ago for rash in the groin area. He describes no significant change in rash, he is cleaning the area twice daily and putting dry dressings. He has dermatology appointment scheduled first week of October. He does not need refill on medicines. Rash is not getting worse but is not getting better either.  No Known Allergies Past Medical History  Diagnosis Date  . Hypertension   . Asthma   . Atrial fibrillation    Current Outpatient Prescriptions on File Prior to Visit  Medication Sig Dispense Refill  . albuterol (PROVENTIL HFA;VENTOLIN HFA) 108 (90 BASE) MCG/ACT inhaler Inhale 2 puffs into the lungs every 6 (six) hours as needed.  1 Inhaler  3  . amiodarone (PACERONE) 200 MG tablet Take 200 mg by mouth daily.      Marland Kitchen aspirin EC 81 MG tablet Take 81 mg by mouth daily.        . ferrous sulfate 325 (65 FE) MG tablet Take 325 mg by mouth daily with breakfast.      . fluticasone-salmeterol (ADVAIR HFA) 115-21 MCG/ACT inhaler Inhale 2 puffs into the lungs as needed.        Marland Kitchen ketoconazole (NIZORAL) 2 % cream Apply topically daily. Apply to rash twice per day  60 g  0  . lisinopril (PRINIVIL,ZESTRIL) 10 MG tablet Take 10 mg by mouth daily.      . pantoprazole (PROTONIX) 40 MG tablet Take 40 mg by mouth daily.       No current facility-administered medications on file prior to visit.   No specific family medical history History   Social History  . Marital Status: Single    Spouse Name: N/A    Number of Children: N/A  . Years of Education: N/A   Occupational History  . Not on file.   Social History Main Topics  . Smoking status: Never Smoker   . Smokeless tobacco: Not on file  . Alcohol Use: Not on file  . Drug Use: Not on file  . Sexual Activity: Not on file   Other Topics Concern  . Not  on file   Social History Narrative  . No narrative on file    Review of Systems  Constitutional: Negative for fever, chills, diaphoresis, activity change, appetite change and fatigue.  HENT: Negative for ear pain, nosebleeds, congestion, facial swelling, rhinorrhea, neck pain, neck stiffness and ear discharge.   Eyes: Negative for pain, discharge, redness, itching and visual disturbance.  Respiratory: Negative for cough, choking, chest tightness, shortness of breath, wheezing and stridor.   Cardiovascular: Negative for chest pain, palpitations and leg swelling.  Gastrointestinal: Negative for abdominal distention.  Genitourinary: Negative for dysuria, urgency, frequency, hematuria, flank pain, decreased urine volume, difficulty urinating and dyspareunia.  Musculoskeletal: Negative for back pain, joint swelling, arthralgias and gait problem.  Neurological: Negative for dizziness, tremors, seizures, syncope, facial asymmetry, speech difficulty, weakness, light-headedness, numbness and headaches.  Hematological: Negative for adenopathy. Does not bruise/bleed easily.  Psychiatric/Behavioral: Negative for hallucinations, behavioral problems, confusion, dysphoric mood, decreased concentration and agitation.    Objective:   Filed Vitals:   09/15/13 1110  BP: 162/73  Pulse: 68  Temp: 98.5 F (36.9 C)  Resp: 18    Physical Exam  Constitutional: Appears well-developed and well-nourished. No distress.  HENT: Normocephalic. External right and left ear normal. Oropharynx is clear and moist.  Eyes: Conjunctivae and EOM are normal. PERRLA, no scleral icterus.  Neck: Normal ROM. Neck supple. No JVD. No tracheal deviation. No thyromegaly.  CVS: RRR, S1/S2 +, no murmurs, no gallops, no carotid bruit.  Pulmonary: Effort and breath sounds normal, no stridor, rhonchi, wheezes, rales.  Abdominal: Soft. BS +,  no distension, tenderness, rebound or guarding.  Musculoskeletal: Normal range of motion.  No edema and no tenderness.  Lymphadenopathy: No lymphadenopathy noted, cervical, inguinal. Neuro: Alert. Normal reflexes, muscle tone coordination. No cranial nerve deficit. Skin: Bilateral groin area eczema type of rash, also noted in lower abdominal area, pannus area  Psychiatric: Normal mood and affect. Behavior, judgment, thought content normal.   Lab Results  Component Value Date   WBC 9.0 03/25/2013   HGB 11.3* 03/25/2013   HCT 35.1* 03/25/2013   MCV 65.1* 03/25/2013   PLT 362 03/25/2013   Lab Results  Component Value Date   CREATININE 1.17 06/25/2013   BUN 9 06/25/2013   NA 141 06/25/2013   K 3.7 06/25/2013   CL 105 06/25/2013   CO2 27 06/25/2013    Lab Results  Component Value Date   HGBA1C 6.1* 03/25/2013   Lipid Panel     Component Value Date/Time   CHOL 144 06/25/2013 0945   TRIG 102 06/25/2013 0945   HDL 35* 06/25/2013 0945   CHOLHDL 4.1 06/25/2013 0945   VLDL 20 06/25/2013 0945   LDLCALC 89 06/25/2013 0945       Assessment and plan:   Patient Active Problem List   Diagnosis Date Noted  . ESSENTIAL HYPERTENSION, BENIGN - we have discussed target blood pressure and I have advised patient to check his blood pressure regularly as blood pressure is slightly above the target range at today's visit.  11/26/2008  .  groin rash  - we have discussed keeping area clean, keeping area dry and placing barrier cream to the area. I have educated patient on dressing changes, patient has appointment with dermatologist and I advised patient to keep the appointment.  08/26/2008

## 2013-09-15 NOTE — Progress Notes (Signed)
Pt is here for a f/u of rash around groin area onset March Denies fevers... Has appt w/derm next month Pain increases when walking and describes the affected area as "raw" He is alert w/no signs of acute distress.

## 2013-09-15 NOTE — Patient Instructions (Addendum)

## 2013-10-10 ENCOUNTER — Ambulatory Visit: Payer: BC Managed Care – PPO | Attending: Internal Medicine | Admitting: Internal Medicine

## 2013-10-10 ENCOUNTER — Encounter: Payer: Self-pay | Admitting: Internal Medicine

## 2013-10-10 VITALS — BP 147/88 | HR 63 | Temp 99.3°F | Resp 14 | Ht 67.0 in | Wt 253.0 lb

## 2013-10-10 DIAGNOSIS — R1110 Vomiting, unspecified: Secondary | ICD-10-CM

## 2013-10-10 DIAGNOSIS — R259 Unspecified abnormal involuntary movements: Secondary | ICD-10-CM | POA: Insufficient documentation

## 2013-10-10 DIAGNOSIS — R251 Tremor, unspecified: Secondary | ICD-10-CM

## 2013-10-10 DIAGNOSIS — J45909 Unspecified asthma, uncomplicated: Secondary | ICD-10-CM

## 2013-10-10 MED ORDER — METOCLOPRAMIDE HCL 5 MG PO TABS: 5.0000 mg | ORAL_TABLET | Freq: Four times a day (QID) | ORAL | Status: DC

## 2013-10-10 MED ORDER — ALBUTEROL SULFATE HFA 108 (90 BASE) MCG/ACT IN AERS: 2.0000 | INHALATION_SPRAY | Freq: Four times a day (QID) | RESPIRATORY_TRACT | Status: DC | PRN

## 2013-10-10 MED ORDER — OMEPRAZOLE 40 MG PO CPDR: 40.0000 mg | DELAYED_RELEASE_CAPSULE | Freq: Two times a day (BID) | ORAL | Status: DC

## 2013-10-10 NOTE — Progress Notes (Signed)
Patient ID: [name] [name], male   DOB: April 23, 1951, 62 y.o.   MRN: [id number]   HPI: 62 y/o.. male presents for what he describes as a seizure of his right arm and vomiting after meals. After detailed discussion it appears that he is not having vomiting but more regurgitation which occurs about an hour after he eats a meal. He has not had any weight loss and in fact has had weight gain which again makes me hesitant to believe that his vomiting his entire meal. He is already taking Protonix once a day. He's had this problem for almost a year. In addition he describes shaking of the right arm which he calls a seizure. When discussing this in detail he tells me it occurs when he is trying to do something for example shake salt onto his food or reach out and grab something. It does not occur at rest and luckily does not occur when he is driving. He does not have any altered mentation or confusion associated with it.  No Known Allergies Past Medical History  Diagnosis Date  . Hypertension   . Asthma   . Atrial fibrillation    Prior to Admission medications   Medication Sig Start Date End Date Taking? Authorizing Provider  albuterol (PROVENTIL HFA;VENTOLIN HFA) 108 (90 BASE) MCG/ACT inhaler Inhale 2 puffs into the lungs every 6 (six) hours as needed. 10/10/13  Yes Calvert Cantor, MD  amiodarone (PACERONE) 200 MG tablet Take 200 mg by mouth daily.   Yes Historical Provider, MD  aspirin EC 81 MG tablet Take 81 mg by mouth daily.     Yes Historical Provider, MD  DIMETHICONE, TOPICAL, (MOISTURE BARRIER) 5 % CREA Apply 1 each topically 3 (three) times daily as needed (as needed to the affected area). 09/15/13  Yes Dorothea Ogle, MD  ferrous sulfate 325 (65 FE) MG tablet Take 325 mg by mouth daily with breakfast.   Yes Historical Provider, MD  fluticasone-salmeterol (ADVAIR HFA) 115-21 MCG/ACT inhaler Inhale 2 puffs into the lungs as needed.     Yes Historical Provider, MD  ketoconazole (NIZORAL) 2 % cream Apply  topically daily. Apply to rash twice per day 12/13/12  Yes [place] Cyndie Mull, MD  lisinopril (PRINIVIL,ZESTRIL) 10 MG tablet Take 10 mg by mouth daily.   Yes Historical Provider, MD  metoCLOPramide (REGLAN) 5 MG tablet Take 1 tablet (5 mg total) by mouth 4 (four) times daily. 10/10/13   Calvert Cantor, MD  omeprazole (PRILOSEC) 40 MG capsule Take 1 capsule (40 mg total) by mouth 2 (two) times daily before a meal. 10/10/13   Calvert Cantor, MD   History reviewed. No pertinent family history. History   Social History  . Marital Status: Single    Spouse Name: N/A    Number of Children: N/A  . Years of Education: N/A   Occupational History  . Not on file.   Social History Main Topics  . Smoking status: Never Smoker   . Smokeless tobacco: Not on file  . Alcohol Use: Not on file  . Drug Use: Not on file  . Sexual Activity: Not on file   Other Topics Concern  . Not on file   Social History Narrative  . No narrative on file      Objective:   Filed Vitals:   10/10/13 1256  BP: 147/88  Pulse: 63  Temp: 99.3 F (37.4 C)  Resp: 14     Physical Exam ______ Constitutional: Appears well-developed and well-nourished. No distress.  ____ HENT: Normocephalic. External right and left ear normal. Oropharynx is clear and moist. ____ Eyes: Conjunctivae and EOM are normal. PERRLA, no scleral icterus. ____ Neck: Normal ROM. Neck supple. No JVD. No tracheal deviation. No thyromegaly. ____ CVS: RRR, S1/S2 +, no murmurs, no gallops, no carotid bruit.  Pulmonary: Effort and breath sounds normal, no stridor, rhonchi, wheezes, rales.  Abdominal: Soft. BS +,  no distension, tenderness, rebound or guarding. ________ Musculoskeletal: Normal range of motion. No edema and no tenderness. ____ Neuro: Alert. Normal reflexes, muscle tone coordination. No cranial nerve deficit. Coarse tremor of his right arm is noted when he tries to reach for my stethoscope but it only starts a few seconds after his  initiated the activity. It does not occur when I asked him to hold his arms out straight in front of him. Skin: Skin is warm and dry. No rash noted. Not diaphoretic. No erythema. No pallor. ____ Psychiatric: Normal mood and affect. Behavior, judgment, thought content normal. __  Lab Results  Component Value Date   WBC 9.0 03/25/2013   HGB 11.3* 03/25/2013   HCT 35.1* 03/25/2013   MCV 65.1* 03/25/2013   PLT 362 03/25/2013   Lab Results  Component Value Date   CREATININE 1.17 06/25/2013   BUN 9 06/25/2013   NA 141 06/25/2013   K 3.7 06/25/2013   CL 105 06/25/2013   CO2 27 06/25/2013    Lab Results  Component Value Date   HGBA1C 6.1* 03/25/2013   Lipid Panel     Component Value Date/Time   CHOL 144 06/25/2013 0945   TRIG 102 06/25/2013 0945   HDL 35* 06/25/2013 0945   CHOLHDL 4.1 06/25/2013 0945   VLDL 20 06/25/2013 0945   LDLCALC 89 06/25/2013 0945       Assessment and plan:   Patient Active Problem List   Diagnosis Date Noted  . Sick sinus syndrome 03/21/2013  . TINEA CRURIS 09/30/2010  . HYPOGONADOTROPIC HYPOGONADISM 09/23/2010  . GERD 09/01/2010  . DIZZINESS 09/01/2010  . CHEST PAIN UNSPECIFIED 09/01/2010  . SLEEP APNEA 08/20/2010  . FATIGUE 06/28/2010  . TREMOR 06/28/2010  . VITAMIN D DEFICIENCY 03/25/2010  . OTHER TESTICULAR HYPOFUNCTION 01/20/2010  . ERECTILE DYSFUNCTION 11/26/2008  . ESSENTIAL HYPERTENSION, BENIGN 11/26/2008  . FOLATE-DEFICIENCY ANEMIA 09/24/2008  . IRON DEFIC ANEMIA SEC DIET IRON INTAKE 09/21/2008  . ATRIAL FIBRILLATION 09/21/2008  . SICK SINUS SYNDROME 09/21/2008  . ASTHMA, UNSPECIFIED, UNSPECIFIED STATUS 09/21/2008  . ESOPHAGITIS 08/26/2008  . ESOPHAGEAL MOTILITY DISORDER 08/26/2008  . GASTRITIS, ACUTE 08/26/2008    Tremor right arm: -I have explained to him that this is not a seizure. He understands. I will refer him to neurology for further management.  Regurgitation of food: -I have recommended twice a day PPI but most likely his insurance will not cover  this therefore, I have changed his Protonix to Prilosec which we have available at our pharmacy and have made this 40 mg twice a day. -In addition I have added low-dose Reglan which I have explained to him to discontinue if it does not help him. -I have made a referral to GI as he may need an endoscopy   He has an appointment to return to our clinic on December 22. Apparently there are no earlier appointments available.   He has declined a flu vaccine     Calvert Cantor, MD

## 2013-10-10 NOTE — Progress Notes (Signed)
Pt is here today for a f/u visit. Pt C.C. Is that he is having uncontrolled muscle spasms off and on for 3 years. Recently the spasms are more frequent. For 2 years pt has been throwing up multiple times a day every day. Pt is requesting a referral to a neurologist.

## 2013-10-22 ENCOUNTER — Ambulatory Visit: Payer: Self-pay | Admitting: Diagnostic Neuroimaging

## 2013-12-01 ENCOUNTER — Encounter: Payer: Self-pay | Admitting: Gastroenterology

## 2013-12-09 ENCOUNTER — Encounter: Payer: Self-pay | Admitting: Gastroenterology

## 2013-12-09 ENCOUNTER — Ambulatory Visit (INDEPENDENT_AMBULATORY_CARE_PROVIDER_SITE_OTHER): Payer: BC Managed Care – PPO | Admitting: Gastroenterology

## 2013-12-09 VITALS — BP 100/70 | HR 77 | Ht 67.0 in | Wt 253.2 lb

## 2013-12-09 DIAGNOSIS — R1319 Other dysphagia: Secondary | ICD-10-CM

## 2013-12-09 NOTE — Progress Notes (Signed)
History of Present Illness:  This is a very nice 62 year old African American male who complains of indigestion and regurgitation with a rumination-type syndrome.  He apparently had a chicken bone stuck in his esophagus in 2002 and underwent endoscopic surgery.  Since that time he has had postprandial regurgitation of partially digested food products, and he does not feel that the material that regurgitates his ascitic, and has not been on any acid suppression therapy when masses.  He denies true dysphagia for solids or liquids.  I cannot see a recent followup endoscopy or barium swallow since 2002, but his records are incomplete.  He allegedly had a colonoscopy in 2009 which was normal.  He denies other GI complaints, and despite all these symptoms his had no anorexia or weight loss.  He has regular bowel movements without melena or hematochezia, but apparently it has been found to be iron deficient by Dr. Sharyn Lull in cardiology who sees because of a history of atrial fibrillation.  Patient is on antihypertensives and also oral iron and daily aspirin.  He [facility] placed on omeprazole 40 mg and Reglan 5 mg before meals and at bedtime with some improvement.  There is no history of known hepatitis, pancreatitis, or bowel obstruction.  Again records are not available for review.  Patient denies a specific food intolerance.  I have reviewed this patient's present history, medical and surgical past history, allergies and medications.     ROS:   All systems were reviewed and are negative unless otherwise stated in the HPI.  No Known Allergies Outpatient Prescriptions Prior to Visit  Medication Sig Dispense Refill  . albuterol (PROVENTIL HFA;VENTOLIN HFA) 108 (90 BASE) MCG/ACT inhaler Inhale 2 puffs into the lungs every 6 (six) hours as needed.  1 Inhaler  7  . amiodarone (PACERONE) 200 MG tablet Take 200 mg by mouth daily.      Marland Kitchen aspirin EC 81 MG tablet Take 81 mg by  mouth daily.        Marland Kitchen DIMETHICONE, TOPICAL, (MOISTURE BARRIER) 5 % CREA Apply 1 each topically 3 (three) times daily as needed (as needed to the affected area).  1 Tube  1  . ferrous sulfate 325 (65 FE) MG tablet Take 325 mg by mouth daily with breakfast.      . fluticasone-salmeterol (ADVAIR HFA) 115-21 MCG/ACT inhaler Inhale 2 puffs into the lungs as needed.        Marland Kitchen ketoconazole (NIZORAL) 2 % cream Apply topically daily. Apply to rash twice per day  60 g  0  . lisinopril (PRINIVIL,ZESTRIL) 10 MG tablet Take 10 mg by mouth daily.      . metoCLOPramide (REGLAN) 5 MG tablet Take 1 tablet (5 mg total) by mouth 4 (four) times daily.  120 tablet  0  . omeprazole (PRILOSEC) 40 MG capsule Take 1 capsule (40 mg total) by mouth 2 (two) times daily before a meal.  60 capsule  3   No facility-administered medications prior to visit.   Past Medical History  Diagnosis Date  . Hypertension   . Asthma   . Atrial fibrillation    Past Surgical History  Procedure Laterality Date  . Esophagus surgery     History   Social History  . Marital Status: Single    Spouse Name: N/A    Number of Children: 1  . Years of Education: N/A   Occupational History  . [employer]    Social  History Main Topics  . Smoking status: Never Smoker   . Smokeless tobacco: Never Used  . Alcohol Use: No  . Drug Use: No  . Sexual Activity: None   Other Topics Concern  . None   Social History Narrative  . None   Family History  Problem Relation Age of Onset  . Cancer Mother        Physical Exam: General well developed well nourished patient in no acute distress, appearing their stated age Eyes PERRLA, no icterus, fundoscopic exam per opthamologist Skin no lesions noted Neck supple, no adenopathy, no thyroid enlargement, no tenderness Chest clear to percussion and auscultation Heart no significant murmurs, gallops or rubs noted Abdomen no hepatosplenomegaly masses or tenderness, BS normal. . Stool guaiac  negative. Extremities no acute joint lesions, edema, phlebitis or evidence of cellulitis. Neurologic patient oriented x 3, cranial nerves intact, no focal neurologic deficits noted. Psychological mental status normal and normal affect.  Assessment and plan: This patient has what sounds like delayed gastric emptying, but has no other evidence of any GI or systemic smooth muscle motility disorder.  He specifically denies Raynaud's phenomenon.  He has had previous endoscopic surgery on his esophagus, and may have underlying achalasia.  I have ordered a barium swallow for review to be followed by endoscopy, and he may need a high-resolution esophageal manometry.  We've requested records from his previous gastroenterologist for review.  He appears to be in a sinus rhythm at this time in his chest is clear, he should be a good candidate for endoscopic exam.  He is to continue all medications as listed above until his workup can be completed.  Review of his radiographs show a normal barium swallow in 2003 and 2009, there was a 4-5 cm hiatal hernia exhibit, and this may be his primary problem.

## 2013-12-09 NOTE — Patient Instructions (Signed)
You have been scheduled for a Barium Esophogram at [facility] (1st floor of the hospital) on 12-10-2013 at 1030 am. Please arrive 15 minutes prior to your appointment for registration. Make certain not to have anything to eat or drink 6 hours prior to your test. If you need to reschedule for any reason, please contact radiology at [phone] to do so. __________________________________________________________________ A barium swallow is an examination that concentrates on views of the esophagus. This tends to be a double contrast exam (barium and two liquids which, when combined, create a gas to distend the wall of the oesophagus) or single contrast (non-ionic iodine based). The study is usually tailored to your symptoms so a good history is essential. Attention is paid during the study to the form, structure and configuration of the esophagus, looking for functional disorders (such as aspiration, dysphagia, achalasia, motility and reflux) EXAMINATION You may be asked to change into a gown, depending on the type of swallow being performed. A radiologist and radiographer will perform the procedure. The radiologist will advise you of the type of contrast selected for your procedure and direct you during the exam. You will be asked to stand, sit or lie in several different positions and to hold a small amount of fluid in your mouth before being asked to swallow while the imaging is performed .In some instances you may be asked to swallow barium coated marshmallows to assess the motility of a solid food bolus. The exam can be recorded as a digital or video fluoroscopy procedure. POST PROCEDURE It will take 1-2 days for the barium to pass through your system. To facilitate this, it is important, unless otherwise directed, to increase your fluids for the next 24-48hrs and to resume your normal diet.  This test typically takes about 30 minutes to  perform. _____________________________________________________________________________________________________________________________________________________________________________________________________  [name] [name] have been scheduled for an endoscopy with propofol. Please follow written instructions given to you at your visit today. If you use inhalers (even only as needed), please bring them with you on the day of your procedure. Your physician has requested that you go to www.[email or web address].com and enter the access code given to you at your visit today. This web site gives a general overview about your procedure. However, you should still follow specific instructions given to you by our office regarding your preparation for the procedure.

## 2013-12-10 ENCOUNTER — Encounter: Payer: Self-pay | Admitting: Gastroenterology

## 2013-12-10 ENCOUNTER — Ambulatory Visit (HOSPITAL_COMMUNITY)
Admission: RE | Admit: 2013-12-10 | Discharge: 2013-12-10 | Disposition: A | Discharge status: Home / Self Care | Payer: BC Managed Care – PPO | Source: Ambulatory Visit | Attending: Gastroenterology | Admitting: Gastroenterology

## 2013-12-10 DIAGNOSIS — K449 Diaphragmatic hernia without obstruction or gangrene: Secondary | ICD-10-CM | POA: Insufficient documentation

## 2013-12-10 DIAGNOSIS — R1319 Other dysphagia: Secondary | ICD-10-CM

## 2013-12-10 DIAGNOSIS — R1310 Dysphagia, unspecified: Secondary | ICD-10-CM | POA: Insufficient documentation

## 2013-12-11 ENCOUNTER — Telehealth: Payer: Self-pay | Admitting: Gastroenterology

## 2013-12-12 NOTE — Telephone Encounter (Signed)
yes

## 2013-12-15 ENCOUNTER — Ambulatory Visit: Payer: BC Managed Care – PPO | Admitting: Internal Medicine

## 2013-12-15 ENCOUNTER — Encounter: Payer: BC Managed Care – PPO | Admitting: Gastroenterology

## 2014-01-07 ENCOUNTER — Other Ambulatory Visit: Payer: BC Managed Care – PPO

## 2014-01-23 ENCOUNTER — Ambulatory Visit: Payer: BC Managed Care – PPO | Attending: Internal Medicine | Admitting: Internal Medicine

## 2014-01-23 ENCOUNTER — Encounter: Payer: Self-pay | Admitting: Internal Medicine

## 2014-01-23 VITALS — BP 137/81 | HR 57 | Temp 98.2°F | Resp 16 | Ht 67.0 in | Wt 255.0 lb

## 2014-01-23 DIAGNOSIS — F9821 Rumination disorder of infancy: Secondary | ICD-10-CM | POA: Insufficient documentation

## 2014-01-23 DIAGNOSIS — R7309 Other abnormal glucose: Secondary | ICD-10-CM | POA: Insufficient documentation

## 2014-01-23 DIAGNOSIS — J45909 Unspecified asthma, uncomplicated: Secondary | ICD-10-CM

## 2014-01-23 DIAGNOSIS — R7303 Prediabetes: Secondary | ICD-10-CM

## 2014-01-23 DIAGNOSIS — K219 Gastro-esophageal reflux disease without esophagitis: Secondary | ICD-10-CM

## 2014-01-23 LAB — CBC WITH DIFFERENTIAL/PLATELET
Basophils Absolute: 0 10*3/uL (ref 0.0–0.1)
Basophils Relative: 1 % (ref 0–1)
EOS ABS: 0.3 10*3/uL (ref 0.0–0.7)
Eosinophils Relative: 4 % (ref 0–5)
HEMATOCRIT: 34 % — AB (ref 39.0–52.0)
HEMOGLOBIN: 10.5 g/dL — AB (ref 13.0–17.0)
LYMPHS ABS: 2.6 10*3/uL (ref 0.7–4.0)
Lymphocytes Relative: 33 % (ref 12–46)
MCH: 20.6 pg — AB (ref 26.0–34.0)
MCHC: 30.9 g/dL (ref 30.0–36.0)
MCV: 66.8 fL — AB (ref 78.0–100.0)
MONO ABS: 0.9 10*3/uL (ref 0.1–1.0)
MONOS PCT: 12 % (ref 3–12)
NEUTROS PCT: 50 % (ref 43–77)
Neutro Abs: 4 10*3/uL (ref 1.7–7.7)
Platelets: 401 10*3/uL — ABNORMAL HIGH (ref 150–400)
RBC: 5.09 MIL/uL (ref 4.22–5.81)
RDW: 17.2 % — ABNORMAL HIGH (ref 11.5–15.5)
WBC: 7.9 10*3/uL (ref 4.0–10.5)

## 2014-01-23 LAB — COMPLETE METABOLIC PANEL WITH GFR
ALBUMIN: 3.4 g/dL — AB (ref 3.5–5.2)
ALT: 10 U/L (ref 0–53)
AST: 18 U/L (ref 0–37)
Alkaline Phosphatase: 88 U/L (ref 39–117)
BILIRUBIN TOTAL: 0.5 mg/dL (ref 0.2–1.2)
BUN: 11 mg/dL (ref 6–23)
CO2: 30 meq/L (ref 19–32)
Calcium: 8.7 mg/dL (ref 8.4–10.5)
Chloride: 103 mEq/L (ref 96–112)
Creat: 0.97 mg/dL (ref 0.50–1.35)
GFR, EST NON AFRICAN AMERICAN: 82 mL/min
GLUCOSE: 84 mg/dL (ref 70–99)
Potassium: 3.7 mEq/L (ref 3.5–5.3)
SODIUM: 139 meq/L (ref 135–145)
TOTAL PROTEIN: 7.3 g/dL (ref 6.0–8.3)

## 2014-01-23 LAB — TSH: TSH: 1.392 u[IU]/mL (ref 0.350–4.500)

## 2014-01-23 LAB — LIPID PANEL
Cholesterol: 149 mg/dL (ref 0–200)
HDL: 40 mg/dL (ref >39)
LDL CALC: 93 mg/dL (ref 0–99)
TRIGLYCERIDES: 82 mg/dL (ref <150)
Total CHOL/HDL Ratio: 3.7 Ratio
VLDL: 16 mg/dL (ref 0–40)

## 2014-01-23 LAB — POCT GLYCOSYLATED HEMOGLOBIN (HGB A1C): HEMOGLOBIN A1C: 5.4

## 2014-01-23 MED ORDER — LISINOPRIL 10 MG PO TABS: 10.0000 mg | ORAL_TABLET | Freq: Every day | ORAL | Status: DC

## 2014-01-23 MED ORDER — ALBUTEROL SULFATE HFA 108 (90 BASE) MCG/ACT IN AERS: 2.0000 | INHALATION_SPRAY | Freq: Four times a day (QID) | RESPIRATORY_TRACT | Status: DC | PRN

## 2014-01-23 MED ORDER — AMIODARONE HCL 200 MG PO TABS: 200.0000 mg | ORAL_TABLET | Freq: Every day | ORAL | Status: DC

## 2014-01-23 NOTE — Progress Notes (Signed)
Patient ID: [name] [name], male   DOB: 1951/02/11, 63 y.o.   MRN: [id number]   CC:  HPI: 63 year old [place] American male presents for a followup. The patient has ruminations-type syndrome, with regurgitation of undigested food. The patient was recently seen by Dr. Sharlett Iles on 12/16. He is awaiting a gastric emptying study as well as an endoscopy. He has previously seen gastroenterology in [facility].  History of right arm seizure with negative EEG in 2012  History of atrial fibrillation, not on any anticoagulation currently, although the patient claims that he is on a blood thinner. He is followed by Dr. Criss Rosales, negative stress test in 2013. Appears to be in sinus rhythm at this point  Nonsmoker nonalcoholic      No Known Allergies Past Medical History  Diagnosis Date  . Hypertension   . Asthma   . Atrial fibrillation    Current Outpatient Prescriptions on File Prior to Visit  Medication Sig Dispense Refill  . albuterol (PROVENTIL HFA;VENTOLIN HFA) 108 (90 BASE) MCG/ACT inhaler Inhale 2 puffs into the lungs every 6 (six) hours as needed.  1 Inhaler  7  . amiodarone (PACERONE) 200 MG tablet Take 200 mg by mouth daily.      Marland Kitchen aspirin EC 81 MG tablet Take 81 mg by mouth daily.        Marland Kitchen DIMETHICONE, TOPICAL, (MOISTURE BARRIER) 5 % CREA Apply 1 each topically 3 (three) times daily as needed (as needed to the affected area).  1 Tube  1  . ferrous sulfate 325 (65 FE) MG tablet Take 325 mg by mouth daily with breakfast.      . fluticasone-salmeterol (ADVAIR HFA) 115-21 MCG/ACT inhaler Inhale 2 puffs into the lungs as needed.        Marland Kitchen ketoconazole (NIZORAL) 2 % cream Apply topically daily. Apply to rash twice per day  60 g  0  . lisinopril (PRINIVIL,ZESTRIL) 10 MG tablet Take 10 mg by mouth daily.      . metoCLOPramide (REGLAN) 5 MG tablet Take 1 tablet (5 mg total) by mouth 4 (four) times daily.  120 tablet  0  . omeprazole (PRILOSEC) 40 MG capsule Take 1 capsule (40 mg total) by mouth  2 (two) times daily before a meal.  60 capsule  3   No current facility-administered medications on file prior to visit.   Family History  Problem Relation Age of Onset  . Cancer Mother    History   Social History  . Marital Status: Single    Spouse Name: N/A    Number of Children: 1  . Years of Education: N/A   Occupational History  . [employer]    Social History Main Topics  . Smoking status: Never Smoker   . Smokeless tobacco: Never Used  . Alcohol Use: No  . Drug Use: No  . Sexual Activity: Not on file   Other Topics Concern  . Not on file   Social History Narrative  . No narrative on file    Review of Systems  Constitutional: Negative for fever, chills, diaphoresis, activity change, appetite change and fatigue.  HENT: Negative for ear pain, nosebleeds, congestion, facial swelling, rhinorrhea, neck pain, neck stiffness and ear discharge.   Eyes: Negative for pain, discharge, redness, itching and visual disturbance.  Respiratory: Negative for cough, choking, chest tightness, shortness of breath, wheezing and stridor.   Cardiovascular: Negative for chest pain, palpitations and leg swelling.  Gastrointestinal: Negative for abdominal distention.  Genitourinary: Negative for dysuria,  urgency, frequency, hematuria, flank pain, decreased urine volume, difficulty urinating and dyspareunia.  Musculoskeletal: Negative for back pain, joint swelling, arthralgias and gait problem.  Neurological: Negative for dizziness, tremors, seizures, syncope, facial asymmetry, speech difficulty, weakness, light-headedness, numbness and headaches.  Hematological: Negative for adenopathy. Does not bruise/bleed easily.  Psychiatric/Behavioral: Negative for hallucinations, behavioral problems, confusion, dysphoric mood, decreased concentration and agitation.    Objective:   Filed Vitals:   01/23/14 0940  BP: 137/81  Pulse: 57  Temp: 98.2 F (36.8 C)  Resp: 16    Physical Exam   Constitutional: Appears well-developed and well-nourished. No distress.  HENT: Normocephalic. External right and left ear normal. Oropharynx is clear and moist.  Eyes: Conjunctivae and EOM are normal. PERRLA, no scleral icterus.  Neck: Normal ROM. Neck supple. No JVD. No tracheal deviation. No thyromegaly.  CVS: RRR, S1/S2 +, no murmurs, no gallops, no carotid bruit.  Pulmonary: Effort and breath sounds normal, no stridor, rhonchi, wheezes, rales.  Abdominal: Soft. BS +,  no distension, tenderness, rebound or guarding.  Musculoskeletal: Normal range of motion. No edema and no tenderness.  Lymphadenopathy: No lymphadenopathy noted, cervical, inguinal. Neuro: Alert. Normal reflexes, muscle tone coordination. No cranial nerve deficit. Skin: Skin is warm and dry. No rash noted. Not diaphoretic. No erythema. No pallor.  Psychiatric: Normal mood and affect. Behavior, judgment, thought content normal.   Lab Results  Component Value Date   WBC 9.0 03/25/2013   HGB 11.3* 03/25/2013   HCT 35.1* 03/25/2013   MCV 65.1* 03/25/2013   PLT 362 03/25/2013   Lab Results  Component Value Date   CREATININE 1.17 06/25/2013   BUN 9 06/25/2013   NA 141 06/25/2013   K 3.7 06/25/2013   CL 105 06/25/2013   CO2 27 06/25/2013    Lab Results  Component Value Date   HGBA1C 6.1* 03/25/2013   Lipid Panel     Component Value Date/Time   CHOL 144 06/25/2013 0945   TRIG 102 06/25/2013 0945   HDL 35* 06/25/2013 0945   CHOLHDL 4.1 06/25/2013 0945   VLDL 20 06/25/2013 0945   LDLCALC 89 06/25/2013 0945       Assessment and plan:   Patient Active Problem List   Diagnosis Date Noted  . Sick sinus syndrome 03/21/2013  . TINEA CRURIS 09/30/2010  . [place] HYPOGONADISM 09/23/2010  . GERD 09/01/2010  . DIZZINESS 09/01/2010  . CHEST PAIN UNSPECIFIED 09/01/2010  . SLEEP APNEA 08/20/2010  . FATIGUE 06/28/2010  . TREMOR 06/28/2010  . VITAMIN D DEFICIENCY 03/25/2010  . OTHER TESTICULAR HYPOFUNCTION 01/20/2010  . ERECTILE  DYSFUNCTION 11/26/2008  . ESSENTIAL HYPERTENSION, BENIGN 11/26/2008  . [place] ANEMIA 09/24/2008  . IRON DEFIC ANEMIA [employer] DIET IRON INTAKE 09/21/2008  . ATRIAL FIBRILLATION 09/21/2008  . SICK SINUS SYNDROME 09/21/2008  . ASTHMA, UNSPECIFIED, UNSPECIFIED STATUS 09/21/2008  . ESOPHAGITIS 08/26/2008  . ESOPHAGEAL MOTILITY DISORDER 08/26/2008  . GASTRITIS, ACUTE 08/26/2008       Rumination syndrome Now followed by [facility] Pending workup On Reglan PPI Symptoms have improved   Pre diabetes with A1c of 6.1 We'll repeat   History of hypogonadotropic hypogonadism and erectile dysfunction, previously evaluated by endocrinology Currently on no medications Will check testosterone level  History of atrial fibrillation currently in sinus rhythm on amiodarone followed by cardiology  History of iron deficiency anemia on ferrous sulfate  Will obtain baseline labs, CBC CMP A1c lipid panel TSH        The patient was given clear instructions  to go to ER or return to medical center if symptoms don't improve, worsen or new problems develop. The patient verbalized understanding. The patient was told to call to get any lab results if not heard anything in the next week.

## 2014-01-23 NOTE — Progress Notes (Signed)
Pt is here following up on his asthma and HTN.

## 2014-01-24 ENCOUNTER — Observation Stay (HOSPITAL_COMMUNITY)
Admission: EM | Admit: 2014-01-24 | Discharge: 2014-01-25 | Disposition: A | Discharge status: Home / Self Care | Payer: BC Managed Care – PPO | Attending: Cardiology | Admitting: Cardiology

## 2014-01-24 ENCOUNTER — Encounter (HOSPITAL_COMMUNITY): Payer: Self-pay | Admitting: Emergency Medicine

## 2014-01-24 ENCOUNTER — Emergency Department (HOSPITAL_COMMUNITY): Payer: BC Managed Care – PPO

## 2014-01-24 DIAGNOSIS — I509 Heart failure, unspecified: Secondary | ICD-10-CM | POA: Insufficient documentation

## 2014-01-24 DIAGNOSIS — R079 Chest pain, unspecified: Secondary | ICD-10-CM | POA: Diagnosis present

## 2014-01-24 DIAGNOSIS — I519 Heart disease, unspecified: Secondary | ICD-10-CM | POA: Insufficient documentation

## 2014-01-24 DIAGNOSIS — R0600 Dyspnea, unspecified: Secondary | ICD-10-CM

## 2014-01-24 DIAGNOSIS — L301 Dyshidrosis [pompholyx]: Secondary | ICD-10-CM | POA: Insufficient documentation

## 2014-01-24 DIAGNOSIS — J45901 Unspecified asthma with (acute) exacerbation: Secondary | ICD-10-CM | POA: Insufficient documentation

## 2014-01-24 DIAGNOSIS — Z7982 Long term (current) use of aspirin: Secondary | ICD-10-CM | POA: Insufficient documentation

## 2014-01-24 DIAGNOSIS — Z79899 Other long term (current) drug therapy: Secondary | ICD-10-CM | POA: Insufficient documentation

## 2014-01-24 DIAGNOSIS — I10 Essential (primary) hypertension: Secondary | ICD-10-CM | POA: Insufficient documentation

## 2014-01-24 DIAGNOSIS — I4891 Unspecified atrial fibrillation: Secondary | ICD-10-CM | POA: Insufficient documentation

## 2014-01-24 DIAGNOSIS — R0789 Other chest pain: Principal | ICD-10-CM | POA: Insufficient documentation

## 2014-01-24 DIAGNOSIS — D649 Anemia, unspecified: Secondary | ICD-10-CM | POA: Insufficient documentation

## 2014-01-24 HISTORY — DX: Heart failure, unspecified: I50.9

## 2014-01-24 HISTORY — DX: Anemia, unspecified: D64.9

## 2014-01-24 HISTORY — DX: Heart disease, unspecified: I51.9

## 2014-01-24 LAB — BASIC METABOLIC PANEL
BUN: 13 mg/dL (ref 6–23)
CO2: 26 mEq/L (ref 19–32)
CREATININE: 1.07 mg/dL (ref 0.50–1.35)
Calcium: 8.8 mg/dL (ref 8.4–10.5)
Chloride: 100 mEq/L (ref 96–112)
GFR calc non Af Amer: 71 mL/min — ABNORMAL LOW (ref >90)
GFR, EST AFRICAN AMERICAN: 83 mL/min — AB (ref >90)
Glucose, Bld: 98 mg/dL (ref 70–99)
Potassium: 4 mEq/L (ref 3.7–5.3)
Sodium: 138 mEq/L (ref 137–147)

## 2014-01-24 LAB — CBC
HEMATOCRIT: 34.9 % — AB (ref 39.0–52.0)
Hemoglobin: 11.1 g/dL — ABNORMAL LOW (ref 13.0–17.0)
MCH: 21.4 pg — ABNORMAL LOW (ref 26.0–34.0)
MCHC: 31.8 g/dL (ref 30.0–36.0)
MCV: 67.2 fL — AB (ref 78.0–100.0)
Platelets: 385 10*3/uL (ref 150–400)
RBC: 5.19 MIL/uL (ref 4.22–5.81)
RDW: 15.9 % — AB (ref 11.5–15.5)
WBC: 9.1 10*3/uL (ref 4.0–10.5)

## 2014-01-24 LAB — POCT I-STAT TROPONIN I: Troponin i, poc: 0 ng/mL (ref 0.00–0.08)

## 2014-01-24 LAB — GLUCOSE, CAPILLARY: Glucose-Capillary: 78 mg/dL (ref 70–99)

## 2014-01-24 LAB — TROPONIN I: Troponin I: <0.3 ng/mL (ref <0.30)

## 2014-01-24 LAB — VITAMIN D 25 HYDROXY (VIT D DEFICIENCY, FRACTURES): Vit D, 25-Hydroxy: 11 ng/mL — ABNORMAL LOW (ref 30–89)

## 2014-01-24 LAB — PRO B NATRIURETIC PEPTIDE: Pro B Natriuretic peptide (BNP): 109 pg/mL (ref 0–125)

## 2014-01-24 MED ORDER — ASPIRIN 81 MG PO CHEW
324.0000 mg | CHEWABLE_TABLET | Freq: Once | ORAL | Status: AC
Start: 2014-01-24 — End: 2014-01-24
  Administered 2014-01-24: 324 mg via ORAL
  Filled 2014-01-24: qty 4

## 2014-01-24 MED ORDER — ONDANSETRON HCL 4 MG/2ML IJ SOLN: 4.0000 mg | Freq: Four times a day (QID) | INTRAMUSCULAR | Status: DC | PRN

## 2014-01-24 MED ORDER — NITROGLYCERIN 0.4 MG SL SUBL
0.4000 mg | SUBLINGUAL_TABLET | SUBLINGUAL | Status: DC | PRN
  Administered 2014-01-24 (×2): 0.4 mg via SUBLINGUAL

## 2014-01-24 MED ORDER — ALBUTEROL SULFATE HFA 108 (90 BASE) MCG/ACT IN AERS: 2.0000 | INHALATION_SPRAY | Freq: Four times a day (QID) | RESPIRATORY_TRACT | Status: DC | PRN

## 2014-01-24 MED ORDER — SODIUM CHLORIDE 0.9 % IV SOLN: 250.0000 mL | INTRAVENOUS | Status: DC | PRN

## 2014-01-24 MED ORDER — ASPIRIN 81 MG PO CHEW: 324.0000 mg | CHEWABLE_TABLET | ORAL | Status: AC

## 2014-01-24 MED ORDER — SODIUM CHLORIDE 0.9 % IJ SOLN: 3.0000 mL | INTRAMUSCULAR | Status: DC | PRN

## 2014-01-24 MED ORDER — METOCLOPRAMIDE HCL 5 MG PO TABS
5.0000 mg | ORAL_TABLET | Freq: Four times a day (QID) | ORAL | Status: DC
  Administered 2014-01-24 – 2014-01-25 (×3): 5 mg via ORAL
  Filled 2014-01-24 (×8): qty 1

## 2014-01-24 MED ORDER — PANTOPRAZOLE SODIUM 40 MG PO TBEC
40.0000 mg | DELAYED_RELEASE_TABLET | Freq: Every day | ORAL | Status: DC
  Administered 2014-01-24 – 2014-01-25 (×2): 40 mg via ORAL
  Filled 2014-01-24: qty 1

## 2014-01-24 MED ORDER — FERROUS SULFATE 325 (65 FE) MG PO TABS
325.0000 mg | ORAL_TABLET | Freq: Every day | ORAL | Status: DC
  Administered 2014-01-25: 325 mg via ORAL
  Filled 2014-01-24 (×2): qty 1

## 2014-01-24 MED ORDER — SODIUM CHLORIDE 0.9 % IJ SOLN
3.0000 mL | Freq: Two times a day (BID) | INTRAMUSCULAR | Status: DC
  Administered 2014-01-25: 3 mL via INTRAVENOUS

## 2014-01-24 MED ORDER — ASPIRIN EC 81 MG PO TBEC
81.0000 mg | DELAYED_RELEASE_TABLET | Freq: Every day | ORAL | Status: DC
  Administered 2014-01-25: 81 mg via ORAL
  Filled 2014-01-24: qty 1

## 2014-01-24 MED ORDER — HEPARIN (PORCINE) IN NACL 100-0.45 UNIT/ML-% IJ SOLN
1450.0000 [IU]/h | INTRAMUSCULAR | Status: DC
Start: 2014-01-24 — End: 2014-01-25
  Administered 2014-01-24 – 2014-01-25 (×2): 1300 [IU]/h via INTRAVENOUS
  Filled 2014-01-24 (×3): qty 250

## 2014-01-24 MED ORDER — ALBUTEROL SULFATE (2.5 MG/3ML) 0.083% IN NEBU
2.5000 mg | INHALATION_SOLUTION | Freq: Four times a day (QID) | RESPIRATORY_TRACT | Status: DC | PRN
Start: 2014-01-24 — End: 2014-01-25

## 2014-01-24 MED ORDER — ACETAMINOPHEN 325 MG PO TABS: 650.0000 mg | ORAL_TABLET | ORAL | Status: DC | PRN

## 2014-01-24 MED ORDER — MOMETASONE FURO-FORMOTEROL FUM 100-5 MCG/ACT IN AERO
2.0000 | INHALATION_SPRAY | Freq: Two times a day (BID) | RESPIRATORY_TRACT | Status: DC
  Filled 2014-01-24 (×2): qty 8.8

## 2014-01-24 MED ORDER — PANTOPRAZOLE SODIUM 40 MG PO TBEC: 40.0000 mg | DELAYED_RELEASE_TABLET | Freq: Every day | ORAL | Status: DC

## 2014-01-24 MED ORDER — HEPARIN BOLUS VIA INFUSION
4000.0000 [IU] | Freq: Once | INTRAVENOUS | Status: AC
  Administered 2014-01-24: 4000 [IU] via INTRAVENOUS
  Filled 2014-01-24: qty 4000

## 2014-01-24 MED ORDER — LISINOPRIL 10 MG PO TABS
10.0000 mg | ORAL_TABLET | Freq: Every day | ORAL | Status: DC
  Administered 2014-01-24 – 2014-01-25 (×2): 10 mg via ORAL
  Filled 2014-01-24 (×3): qty 1

## 2014-01-24 MED ORDER — ASPIRIN 300 MG RE SUPP
300.0000 mg | RECTAL | Status: AC
  Filled 2014-01-24: qty 1

## 2014-01-24 MED ORDER — AMIODARONE HCL 200 MG PO TABS
200.0000 mg | ORAL_TABLET | Freq: Every day | ORAL | Status: DC
  Administered 2014-01-24 – 2014-01-25 (×2): 200 mg via ORAL
  Filled 2014-01-24 (×3): qty 1

## 2014-01-24 MED ORDER — LISINOPRIL 10 MG PO TABS: 10.0000 mg | ORAL_TABLET | Freq: Every day | ORAL | Status: DC

## 2014-01-24 NOTE — ED Notes (Signed)
cbg 78

## 2014-01-24 NOTE — Progress Notes (Signed)
ANTICOAGULATION CONSULT NOTE - Initial Consult  Pharmacy Consult for Heparin Indication: chest pain/ACS  No Known Allergies  Patient Measurements: Height: 5\' 7"  (170.2 cm) Weight: 252 lb 9.6 oz (114.579 kg) IBW/kg (Calculated) : 66.1 Heparin Dosing Weight: 92 kg  Vital Signs: Temp: 98.1 F (36.7 C) (01/31 1531) Temp src: Oral (01/31 1531) BP: 151/78 mmHg (01/31 1531) Pulse Rate: 51 (01/31 1531)  Labs:  Recent Labs  01/24/14 1059  HGB 11.1*  HCT 34.9*  PLT 385  CREATININE 1.07    Estimated Creatinine Clearance: 84.3 ml/min (by C-G formula based on Cr of 1.07).   Medical History: Past Medical History  Diagnosis Date  . Hypertension   . Asthma   . Atrial fibrillation   . CHF (congestive heart failure)   . Anemia   . Heart disease     Medications:  Prescriptions prior to admission  Medication Sig Dispense Refill  . albuterol (PROVENTIL HFA;VENTOLIN HFA) 108 (90 BASE) MCG/ACT inhaler Inhale 2 puffs into the lungs every 6 (six) hours as needed.  1 Inhaler  7  . amiodarone (PACERONE) 200 MG tablet Take 1 tablet (200 mg total) by mouth daily.  60 tablet  2  . aspirin EC 81 MG tablet Take 81 mg by mouth daily.        . ferrous sulfate 325 (65 FE) MG tablet Take 325 mg by mouth daily with breakfast.      . ketoconazole (NIZORAL) 2 % cream Apply 1 application topically every other day. Apply to upper thigh area every other day      . lisinopril (PRINIVIL,ZESTRIL) 10 MG tablet Take 1 tablet (10 mg total) by mouth daily.  60 tablet  2  . metoCLOPramide (REGLAN) 5 MG tablet Take 1 tablet (5 mg total) by mouth 4 (four) times daily.  120 tablet  0  . omeprazole (PRILOSEC) 40 MG capsule Take 1 capsule (40 mg total) by mouth 2 (two) times daily before a meal.  60 capsule  3  . fluticasone-salmeterol (ADVAIR HFA) 115-21 MCG/ACT inhaler Inhale 2 puffs into the lungs daily as needed. For shortness of breath        Assessment: 63 y.o. male presents with chest pain. To begin  heparin for r/o ACS. Plan for nuclear stress test in a.m. CBC ok at baseline.  Goal of Therapy:  INR 2-3 Heparin level 0.3-0.7 units/ml Monitor platelets by anticoagulation protocol: Yes   Plan:  1. Heparin IV bolus 4000 units 2. Heparin IV gtt at 1300 units/hr 3. Will f/u 6 hr heparin level 4. Daily heparin level and CBC  Sherlon Handing, PharmD, BCPS Clinical pharmacist, pager [phone] 01/24/2014,3:55 PM

## 2014-01-24 NOTE — ED Provider Notes (Signed)
CSN: [id number]     Arrival date & time 01/24/14  1032 History   First MD Initiated Contact with Patient 01/24/14 1105     Chief Complaint  Patient presents with  . Shortness of Breath   (Consider location/radiation/quality/duration/timing/severity/associated sxs/prior Treatment) HPI Comments: 65 old male presents with shortness of breath. He states that he woke up 4 hours ago and he noticed as sheets were soaked with sweat and he is feeling short of breath and a little bit of chest discomfort. He rated the discomfort as a 2/10. States it could be a little bit heavy but is otherwise not really a pain. He states that he called EMTs and they checked his blood pressure was a little bit elevated at 120/100 but his heart rate was in control and so he stated home. Then he started having the shortness of breath begins became to the ER. He states that at this moment he is not having shortness of breath worsened his chronic, daily shortness of breath at rest. He states he has about a 1/10 chest discomfort at his lower left anterior chest. He was concerned he might be having heart failure as he has had shortness of breath like this once before. He states his legs are all is a little bit swollen but denies them being more swollen recently. His cardiologist is Dr. Terrence Dupont for atrial fibrillation.   Past Medical History  Diagnosis Date  . Hypertension   . Asthma   . Atrial fibrillation   . CHF (congestive heart failure)   . Anemia   . Heart disease    Past Surgical History  Procedure Laterality Date  . Esophagus surgery     Family History  Problem Relation Age of Onset  . Cancer Mother    History  Substance Use Topics  . Smoking status: Never Smoker   . Smokeless tobacco: Never Used  . Alcohol Use: No    Review of Systems  Constitutional: Positive for diaphoresis. Negative for fever and chills.  Respiratory: Positive for shortness of breath. Negative for cough and wheezing.   Cardiovascular:  Positive for chest pain. Negative for leg swelling.  Gastrointestinal: Negative for vomiting and abdominal pain.  Musculoskeletal: Negative for back pain.  All other systems reviewed and are negative.    Allergies  Review of patient's allergies indicates no known allergies.  Home Medications   Current Outpatient Rx  Name  Route  Sig  Dispense  Refill  . albuterol (PROVENTIL HFA;VENTOLIN HFA) 108 (90 BASE) MCG/ACT inhaler   Inhalation   Inhale 2 puffs into the lungs every 6 (six) hours as needed.   1 Inhaler   7   . amiodarone (PACERONE) 200 MG tablet   Oral   Take 1 tablet (200 mg total) by mouth daily.   60 tablet   2   . aspirin EC 81 MG tablet   Oral   Take 81 mg by mouth daily.           Marland Kitchen DIMETHICONE, TOPICAL, (MOISTURE BARRIER) 5 % CREA   Apply externally   Apply 1 each topically 3 (three) times daily as needed (as needed to the affected area).   1 Tube   1   . ferrous sulfate 325 (65 FE) MG tablet   Oral   Take 325 mg by mouth daily with breakfast.         . fluticasone-salmeterol (ADVAIR HFA) 115-21 MCG/ACT inhaler   Inhalation   Inhale 2 puffs into the lungs  as needed.           Marland Kitchen ketoconazole (NIZORAL) 2 % cream   Topical   Apply topically daily. Apply to rash twice per day   60 g   0   . lisinopril (PRINIVIL,ZESTRIL) 10 MG tablet   Oral   Take 1 tablet (10 mg total) by mouth daily.   60 tablet   2   . metoCLOPramide (REGLAN) 5 MG tablet   Oral   Take 1 tablet (5 mg total) by mouth 4 (four) times daily.   120 tablet   0   . omeprazole (PRILOSEC) 40 MG capsule   Oral   Take 1 capsule (40 mg total) by mouth 2 (two) times daily before a meal.   60 capsule   3    BP 150/79  Pulse 55  Temp(Src) 97.5 F (36.4 C) (Oral)  Resp 15  Ht 5\' 7"  (1.702 m)  Wt 252 lb 9.6 oz (114.579 kg)  BMI 39.55 kg/m2  SpO2 99% Physical Exam  Nursing note and vitals reviewed. Constitutional: He is oriented to person, place, and time. He appears  well-developed and well-nourished. No distress.  HENT:  Head: Normocephalic and atraumatic.  Right Ear: External ear normal.  Left Ear: External ear normal.  Nose: Nose normal.  Eyes: Right eye exhibits no discharge. Left eye exhibits no discharge.  Neck: Neck supple.  Cardiovascular: Normal rate, regular rhythm, normal heart sounds and intact distal pulses.   Pulmonary/Chest: Effort normal and breath sounds normal. He has no rales. He exhibits no tenderness.  Abdominal: Soft. There is no tenderness.  Musculoskeletal: He exhibits no edema.  No lower extremity pitting edema  Neurological: He is alert and oriented to person, place, and time.  Skin: Skin is warm and dry.    ED Course  Procedures (including critical care time) Labs Review Labs Reviewed  CBC - Abnormal; Notable for the following:    Hemoglobin 11.1 (*)    HCT 34.9 (*)    MCV 67.2 (*)    MCH 21.4 (*)    RDW 15.9 (*)    All other components within normal limits  BASIC METABOLIC PANEL - Abnormal; Notable for the following:    GFR calc non Af Amer 71 (*)    GFR calc Af Amer 83 (*)    All other components within normal limits  PRO B NATRIURETIC PEPTIDE  GLUCOSE, CAPILLARY  POCT I-STAT TROPONIN I   Imaging Review Dg Chest 2 View  01/24/2014   CLINICAL DATA:  Shortness of breath.  Asthma.  Cardiac arrhythmia.  EXAM: CHEST  2 VIEW  COMPARISON:  12/16/2011  FINDINGS: The heart size and mediastinal contours are within normal limits. Both lungs are clear. The visualized skeletal structures are unremarkable.  IMPRESSION: No active cardiopulmonary disease.   Electronically Signed   By: Earle Gell M.D.   On: 01/24/2014 12:26    EKG Interpretation    Date/Time:  Saturday January 24 2014 10:37:31 EST Ventricular Rate:  56 PR Interval:  164 QRS Duration: 86 QT Interval:  436 QTC Calculation: 420 R Axis:   -6 Text Interpretation:  Sinus bradycardia Low voltage QRS Cannot rule out Anterior infarct , age undetermined  Abnormal ECG No significant change since last tracing Confirmed by Andraya Frigon  MD, Reah Justo ([id number]) on 01/24/2014 11:06:40 AM            MDM   1. Chest discomfort   2. Dyspnea    Patient with atypical chest and  lung symptoms. These are intermittent. No signs of CHF or fluid overload. No wheezing or other lung symptoms such as cough. With his chest discomfort there is concern this is anginal in origin. Pain resolved with 2 nitroglycerin. D/w Dr. Doylene Canard, will admit for ACS r/o.    Ephraim Hamburger, MD 01/24/14 1336

## 2014-01-24 NOTE — ED Notes (Addendum)
Pt reports he woke this am "in a sweat" and felt sob and lightheaded, symptoms have persisted since. Denies pain. Breathing mild labored on exertion in triage but pt can speak in full sentences. States " i just feel like somethings not right."

## 2014-01-24 NOTE — ED Notes (Signed)
Dr Kadakia at bedside 

## 2014-01-24 NOTE — H&P (Signed)
[name] [name] is an 63 y.o. male.   Chief Complaint: Chest pain HPI: 17 old male presents with shortness of breath and recurrent chest pain. He states that he woke up 4 hours ago and he noticed as sheets were soaked with sweat and he is feeling short of breath and a little bit of chest discomfort. He rated the discomfort as a 2/10. States it could be a little bit heavy but is otherwise not really a pain. EKG shows sinus bradycardia and low voltage.   Past Medical History  Diagnosis Date  . Hypertension   . Asthma   . Atrial fibrillation   . CHF (congestive heart failure)   . Anemia   . Heart disease       Past Surgical History  Procedure Laterality Date  . Esophagus surgery      Family History  Problem Relation Age of Onset  . Cancer Mother    Social History:  reports that he has never smoked. He has never used smokeless tobacco. He reports that he does not drink alcohol or use illicit drugs.  Allergies: No Known Allergies   (Not in a hospital admission)  Results for orders placed during the hospital encounter of 01/24/14 (from the past 48 hour(s))  GLUCOSE, CAPILLARY     Status: None   Collection Time    01/24/14 10:48 AM      Result Value Range   Glucose-Capillary 78  70 - 99 mg/dL  CBC     Status: Abnormal   Collection Time    01/24/14 10:59 AM      Result Value Range   WBC 9.1  4.0 - 10.5 K/uL   RBC 5.19  4.22 - 5.81 MIL/uL   Hemoglobin 11.1 (*) 13.0 - 17.0 g/dL   HCT 34.9 (*) 39.0 - 52.0 %   MCV 67.2 (*) 78.0 - 100.0 fL   MCH 21.4 (*) 26.0 - 34.0 pg   MCHC 31.8  30.0 - 36.0 g/dL   RDW 15.9 (*) 11.5 - 15.5 %   Platelets 385  150 - 400 K/uL  BASIC METABOLIC PANEL     Status: Abnormal   Collection Time    01/24/14 10:59 AM      Result Value Range   Sodium 138  137 - 147 mEq/L   Potassium 4.0  3.7 - 5.3 mEq/L   Chloride 100  96 - 112 mEq/L   CO2 26  19 - 32 mEq/L   Glucose, Bld 98  70 - 99 mg/dL   BUN 13  6 - 23 mg/dL   Creatinine, Ser 1.07  0.50 - 1.35  mg/dL   Calcium 8.8  8.4 - 10.5 mg/dL   GFR calc non Af Amer 71 (*) >90 mL/min   GFR calc Af Amer 83 (*) >90 mL/min   Comment: (NOTE)     The eGFR has been calculated using the CKD EPI equation.     This calculation has not been validated in all clinical situations.     eGFR's persistently <90 mL/min signify possible Chronic Kidney     Disease.  PRO B NATRIURETIC PEPTIDE     Status: None   Collection Time    01/24/14 10:59 AM      Result Value Range   Pro B Natriuretic peptide (BNP) 109.0  0 - 125 pg/mL  POCT I-STAT TROPONIN I     Status: None   Collection Time    01/24/14 11:12 AM      Result  Value Range   Troponin i, poc 0.00  0.00 - 0.08 ng/mL   Comment 3            Comment: Due to the release kinetics of cTnI,     a negative result within the first hours     of the onset of symptoms does not rule out     myocardial infarction with certainty.     If myocardial infarction is still suspected,     repeat the test at appropriate intervals.   Dg Chest 2 View  01/24/2014   CLINICAL DATA:  Shortness of breath.  Asthma.  Cardiac arrhythmia.  EXAM: CHEST  2 VIEW  COMPARISON:  12/16/2011  FINDINGS: The heart size and mediastinal contours are within normal limits. Both lungs are clear. The visualized skeletal structures are unremarkable.  IMPRESSION: No active cardiopulmonary disease.   Electronically Signed   By: Earle Gell M.D.   On: 01/24/2014 12:26    ROS Constitutional: Positive for diaphoresis. Negative for fever and chills.  Respiratory: Positive for shortness of breath. Negative for cough and wheezing.  Cardiovascular: Positive for chest pain. Negative for leg swelling.  Gastrointestinal: Negative for vomiting and abdominal pain.  Musculoskeletal: Negative for back pain.  All other systems reviewed and are negative.  Blood pressure 127/65, pulse 52, temperature 97.5 F (36.4 C), temperature source Oral, resp. rate 23, height _0  (1.702 m), weight 114.579 kg (252 lb 9.6 oz),  SpO2 100.00%.  Physical Exam   Constitutional: He is oriented to person, place, and time. He appears well-developed and well-nourished. No distress.  HENT: Head: Normocephalic and atraumatic. Right Ear: External ear normal. Left Ear: External ear normal. Nose: Nose normal. Eyes: Brown, conj-pink. Right eye exhibits no discharge. Left eye exhibits no discharge.  Neck: Neck supple.  Cardiovascular: Normal rate, regular rhythm, normal heart sounds and intact distal pulses.  Pulmonary/Chest: Effort normal and breath sounds normal. He has no rales. He exhibits no tenderness.  Abdominal: Soft. There is no tenderness.  Musculoskeletal: He exhibits no edema.  Neurological: He is alert and oriented to person, place, and time. Moves all four extremities. Skin: Skin is warm and dry.   Assessment/Plan Chest pain CAD Paroxysmal Atrial fibrillation H/O CHF Hypertension Asthma Chronic mild anemia, iron deficiency  Place in observation R/O MI. Nuclear stress test in AM.  Najwa Spillane S 01/24/2014, 1:38 PM

## 2014-01-25 ENCOUNTER — Other Ambulatory Visit (HOSPITAL_COMMUNITY): Payer: BC Managed Care – PPO

## 2014-01-25 ENCOUNTER — Observation Stay (HOSPITAL_COMMUNITY): Payer: BC Managed Care – PPO

## 2014-01-25 LAB — BASIC METABOLIC PANEL
BUN: 15 mg/dL (ref 6–23)
CO2: 23 mEq/L (ref 19–32)
Calcium: 8.5 mg/dL (ref 8.4–10.5)
Chloride: 104 mEq/L (ref 96–112)
Creatinine, Ser: 1.11 mg/dL (ref 0.50–1.35)
GFR calc Af Amer: 79 mL/min — ABNORMAL LOW (ref >90)
GFR, EST NON AFRICAN AMERICAN: 68 mL/min — AB (ref >90)
Glucose, Bld: 94 mg/dL (ref 70–99)
POTASSIUM: 4 meq/L (ref 3.7–5.3)
SODIUM: 140 meq/L (ref 137–147)

## 2014-01-25 LAB — CBC
HCT: 32.5 % — ABNORMAL LOW (ref 39.0–52.0)
HEMOGLOBIN: 10.2 g/dL — AB (ref 13.0–17.0)
MCH: 21.1 pg — ABNORMAL LOW (ref 26.0–34.0)
MCHC: 31.4 g/dL (ref 30.0–36.0)
MCV: 67.3 fL — ABNORMAL LOW (ref 78.0–100.0)
PLATELETS: 330 10*3/uL (ref 150–400)
RBC: 4.83 MIL/uL (ref 4.22–5.81)
RDW: 16 % — ABNORMAL HIGH (ref 11.5–15.5)
WBC: 8.9 10*3/uL (ref 4.0–10.5)

## 2014-01-25 LAB — LIPID PANEL
Cholesterol: 145 mg/dL (ref 0–200)
HDL: 39 mg/dL — ABNORMAL LOW (ref >39)
LDL Cholesterol: 83 mg/dL (ref 0–99)
Total CHOL/HDL Ratio: 3.7 RATIO
Triglycerides: 116 mg/dL (ref <150)
VLDL: 23 mg/dL (ref 0–40)

## 2014-01-25 LAB — HEPARIN LEVEL (UNFRACTIONATED)
HEPARIN UNFRACTIONATED: 0.24 [IU]/mL — AB (ref 0.30–0.70)
Heparin Unfractionated: 0.32 IU/mL (ref 0.30–0.70)

## 2014-01-25 LAB — TROPONIN I
Troponin I: <0.3 ng/mL (ref <0.30)
Troponin I: <0.3 ng/mL (ref <0.30)

## 2014-01-25 LAB — PROTIME-INR
INR: 1.03 (ref 0.00–1.49)
PROTHROMBIN TIME: 13.3 s (ref 11.6–15.2)

## 2014-01-25 MED ORDER — NITROGLYCERIN 0.4 MG SL SUBL: 0.4000 mg | SUBLINGUAL_TABLET | SUBLINGUAL | Status: AC | PRN

## 2014-01-25 MED ORDER — TECHNETIUM TC 99M SESTAMIBI - CARDIOLITE: 10.0000 | Freq: Once | INTRAVENOUS | Status: DC | PRN

## 2014-01-25 MED ORDER — TECHNETIUM TC 99M SESTAMIBI - CARDIOLITE: 30.0000 | Freq: Once | INTRAVENOUS | Status: DC | PRN

## 2014-01-25 MED ORDER — REGADENOSON 0.4 MG/5ML IV SOLN
INTRAVENOUS | Status: AC
  Administered 2014-01-25: 0.4 mg
  Filled 2014-01-25: qty 5

## 2014-01-25 NOTE — Discharge Summary (Signed)
Physician Discharge Summary  Patient ID: [name] [name] MRN: [id number] DOB/AGE: [birth year] 63 y.o.  Admit date: 01/24/2014 Discharge date: 01/25/2014  Admission Diagnoses: Chest pain  CAD  Paroxysmal Atrial fibrillation  H/O CHF  Hypertension  Asthma  Chronic mild anemia, iron deficiency  Discharge Diagnoses:  Principle Problem: * Chest pain * CAD  Paroxysmal Atrial fibrillation  H/O CHF  Hypertension  Asthma  Chronic mild anemia, iron deficiency  Discharged Condition: fair  Hospital Course: 63 old male presented with shortness of breath and recurrent chest pain. He stated that he woke up 4 hours ago and he noticed his sheets were soaked with sweat and he was feeling short of breath and had a little bit of chest discomfort. He rated the discomfort as a 2/10. He stated it could be a little bit heavy but was otherwise not really a pain. EKG showed sinus bradycardia and low voltage. His cardiac enzymes were normal. He underwent nuclear stress test which was unremarkable. His medications were adjusted and he was discharged home in stable condition.  Consults: cardiology  Significant Diagnostic Studies: labs: Normal BMET and near normal CBC with mild anemia (Hgb 11.1). His EKG showed sinus bradycardia and low voltage. His nuclear stress test showed 60 % EF and no reversible ischemia.  Treatments: cardiac meds: lisinopril (Zestril), amiodarone and NTG.  B-blocker not used due to baseline bradycardia.  Discharge Exam: Blood pressure 136/67, pulse 66, temperature 97.6 F (36.4 C), temperature source Oral, resp. rate 19, height 5\' 7"  (1.702 m), weight 114.579 kg (252 lb 9.6 oz), SpO2 97.00%.  Constitutional: He is oriented to person, place, and time. He appears well-developed and well-nourished. No distress.  HENT: Head: Normocephalic and atraumatic. Right Ear: External ear normal. Left Ear: External ear normal. Nose: Nose normal. Eyes: Brown, somewhat prominent, conj-pink. Right eye  exhibits no discharge. Left eye exhibits no discharge.  Neck: Neck supple.  Cardiovascular: Normal rate, regular rhythm, normal heart sounds and intact distal pulses.  Pulmonary/Chest: Effort normal and breath sounds normal. He has no rales. He exhibits no tenderness.  Abdominal: Soft. There is no tenderness.  Musculoskeletal: He exhibits no edema.  Neurological: He is alert and oriented to person, place, and time. Moves all four extremities.  Skin: Skin is warm and dry.   Disposition: 01-Home or Self Care   Future Appointments Provider Department Dept Phone   04/27/2014 10:30 AM Angelica Chessman, MD [place] [phone]       Medication List         albuterol 108 (90 BASE) MCG/ACT inhaler  Commonly known as:  PROVENTIL HFA;VENTOLIN HFA  Inhale 2 puffs into the lungs every 6 (six) hours as needed.     amiodarone 200 MG tablet  Commonly known as:  PACERONE  Take 1 tablet (200 mg total) by mouth daily.     aspirin EC 81 MG tablet  Take 81 mg by mouth daily.     ferrous sulfate 325 (65 FE) MG tablet  Take 325 mg by mouth daily with breakfast.     fluticasone-salmeterol 115-21 MCG/ACT inhaler  Commonly known as:  ADVAIR HFA  Inhale 2 puffs into the lungs daily as needed. For shortness of breath     ketoconazole 2 % cream  Commonly known as:  NIZORAL  Apply 1 application topically every other day. Apply to upper thigh area every other day     lisinopril 10 MG tablet  Commonly known as:  PRINIVIL,ZESTRIL  Take 1 tablet (  10 mg total) by mouth daily.     metoCLOPramide 5 MG tablet  Commonly known as:  REGLAN  Take 1 tablet (5 mg total) by mouth 4 (four) times daily.     nitroGLYCERIN 0.4 MG SL tablet  Commonly known as:  NITROSTAT  Place 1 tablet (0.4 mg total) under the tongue every 5 (five) minutes as needed for chest pain.     omeprazole 40 MG capsule  Commonly known as:  PRILOSEC  Take 1 capsule (40 mg total) by mouth 2 (two) times  daily before a meal.           Follow-up Information   Follow up with JEGEDE, OLUGBEMIGA, MD. Schedule an appointment as soon as possible for a visit in 2 weeks.   Specialty:  Internal Medicine   Contact information:   [place] [id number] [phone]       Follow up with Clent Demark, MD. Schedule an appointment as soon as possible for a visit in 2 weeks.   Specialty:  Cardiology   Contact information:   [place] [address] [place] [address] [phone]       Signed: Birdie Riddle 01/25/2014, 3:19 PM

## 2014-01-25 NOTE — Progress Notes (Signed)
ANTICOAGULATION CONSULT NOTE - Initial Consult  Pharmacy Consult for Heparin Indication: chest pain/ACS  No Known Allergies  Patient Measurements: Height: 5\' 7"  (170.2 cm) Weight: 252 lb 9.6 oz (114.579 kg) IBW/kg (Calculated) : 66.1 Heparin Dosing Weight: 92 kg  Vital Signs: Temp: 97.8 F (36.6 C) (02/01 0500) BP: 153/80 mmHg (02/01 0500) Pulse Rate: 65 (02/01 0500)  Labs:  Recent Labs  01/24/14 1059 01/24/14 1645 01/24/14 2313 01/25/14 0544  HGB 11.1*  --   --  10.2*  HCT 34.9*  --   --  32.5*  PLT 385  --   --  330  LABPROT  --   --   --  13.3  INR  --   --   --  1.03  HEPARINUNFRC  --   --  0.32 0.24*  CREATININE 1.07  --   --  1.11  TROPONINI  --  <0.30 <0.30 <0.30    Estimated Creatinine Clearance: 81.3 ml/min (by C-G formula based on Cr of 1.11).   Medical History: Past Medical History  Diagnosis Date  . Hypertension   . Asthma   . Atrial fibrillation   . CHF (congestive heart failure)   . Anemia   . Heart disease     Medications:  Prescriptions prior to admission  Medication Sig Dispense Refill  . albuterol (PROVENTIL HFA;VENTOLIN HFA) 108 (90 BASE) MCG/ACT inhaler Inhale 2 puffs into the lungs every 6 (six) hours as needed.  1 Inhaler  7  . amiodarone (PACERONE) 200 MG tablet Take 1 tablet (200 mg total) by mouth daily.  60 tablet  2  . aspirin EC 81 MG tablet Take 81 mg by mouth daily.        . ferrous sulfate 325 (65 FE) MG tablet Take 325 mg by mouth daily with breakfast.      . ketoconazole (NIZORAL) 2 % cream Apply 1 application topically every other day. Apply to upper thigh area every other day      . lisinopril (PRINIVIL,ZESTRIL) 10 MG tablet Take 1 tablet (10 mg total) by mouth daily.  60 tablet  2  . metoCLOPramide (REGLAN) 5 MG tablet Take 1 tablet (5 mg total) by mouth 4 (four) times daily.  120 tablet  0  . omeprazole (PRILOSEC) 40 MG capsule Take 1 capsule (40 mg total) by mouth 2 (two) times daily before a meal.  60 capsule  3  .  fluticasone-salmeterol (ADVAIR HFA) 115-21 MCG/ACT inhaler Inhale 2 puffs into the lungs daily as needed. For shortness of breath        Assessment: 63 y.o. male presents with chest pain. To begin heparin for r/o ACS. Planning for nuclear stress test . CBC ok, INR 1.1 at baseline. HL slightly subtherapeutic this morning.  Goal of Therapy:  Heparin level 0.3-0.7 units/ml Monitor platelets by anticoagulation protocol: Yes   Plan:   Increase heparin gtt to 1450 units/hr  Will f/u 6 hr heparin level  Daily heparin level and CBC  Hughes Better, PharmD, BCPS Clinical pharmacist 01/25/2014,8:40 AM

## 2014-01-25 NOTE — Progress Notes (Signed)
Utilization review completed.  

## 2014-01-25 NOTE — Progress Notes (Signed)
ANTICOAGULATION CONSULT NOTE - Follow Up Consult  Pharmacy Consult for heparin Indication: chest pain/ACS and atrial fibrillation  Labs:  Recent Labs  01/24/14 1059 01/24/14 1645 01/24/14 2313  HGB 11.1*  --   --   HCT 34.9*  --   --   PLT 385  --   --   HEPARINUNFRC  --   --  0.32  CREATININE 1.07  --   --   TROPONINI  --  <0.30 <0.30    Assessment/Plan:  63yo male therapeutic on heparin with initial dosing for CP.  Will continue gtt at current rate and confirm stable with am labs.  Wynona Neat, PharmD, BCPS  01/25/2014,1:11 AM

## 2014-01-26 LAB — TESTOSTERONE, FREE, TOTAL, SHBG
SEX HORMONE BINDING: 39 nmol/L (ref 13–71)
Testosterone, Free: 29.6 pg/mL — ABNORMAL LOW (ref 47.0–244.0)
Testosterone-% Free: 1.7 % (ref 1.6–2.9)
Testosterone: 174.39 ng/dL — ABNORMAL LOW (ref 300–890)

## 2014-01-27 ENCOUNTER — Telehealth: Payer: Self-pay | Admitting: *Deleted

## 2014-01-27 MED ORDER — VITAMIN D (ERGOCALCIFEROL) 1.25 MG (50000 UNIT) PO CAPS: 50000.0000 [IU] | ORAL_CAPSULE | ORAL | Status: DC

## 2014-01-27 NOTE — Addendum Note (Signed)
Addended by: Allyson Sabal MD, Ascencion Dike on: 01/27/2014 02:27 PM   Modules accepted: Orders

## 2014-01-27 NOTE — Addendum Note (Signed)
Addended by: Allyson Sabal MD, Ascencion Dike on: 01/27/2014 05:07 PM   Modules accepted: Orders

## 2014-01-27 NOTE — Telephone Encounter (Signed)
Message copied by Delphina Schum, [place] R on Tue Jan 27, 2014  2:41 PM ------      Message from: Allyson Sabal MD, Ascencion Dike      Created: Tue Jan 27, 2014  2:28 PM       Notify patient of the patient's hemoglobin is 10.5, he had severe iron  deficiency anemia, needs to have a routine colonoscopy      His testosterone level is also low, the patient is being referred to endocrinology      Vitamin D level is also low at 11, please call in a prescription for vitamin D 50,000 units weekly, 10 tablets with 2 refills ------

## 2014-02-06 ENCOUNTER — Ambulatory Visit: Payer: BC Managed Care – PPO | Admitting: Endocrinology

## 2014-02-11 ENCOUNTER — Ambulatory Visit (INDEPENDENT_AMBULATORY_CARE_PROVIDER_SITE_OTHER): Payer: BC Managed Care – PPO | Admitting: Endocrinology

## 2014-02-11 ENCOUNTER — Encounter: Payer: Self-pay | Admitting: Endocrinology

## 2014-02-11 VITALS — BP 130/80 | HR 83 | Temp 98.3°F | Ht 67.0 in | Wt 252.0 lb

## 2014-02-11 DIAGNOSIS — E291 Testicular hypofunction: Secondary | ICD-10-CM

## 2014-02-11 MED ORDER — SILDENAFIL CITRATE 100 MG PO TABS: 100.0000 mg | ORAL_TABLET | Freq: Every day | ORAL | Status: DC | PRN

## 2014-02-11 MED ORDER — CLOMIPHENE CITRATE 50 MG PO TABS: ORAL_TABLET | ORAL | Status: DC

## 2014-02-11 NOTE — Patient Instructions (Addendum)
i have sent a prescription to your pharmacy, to help the testosterone.   Also, i have sent a prescription to your pharmacy, for viagra.  Don't takes this along with nitroglycerin. normalization of testosterone is not known to harm you.  however, there are "theoretical" risks, including increased fertility, hair loss, prostate cancer, benign prostate enlargement, blood clots, liver problems, lower hdl ("good cholesterol"), sleep apnea, and behavior changes.  Please come back for a follow-up appointment in 6 weeks.

## 2014-02-11 NOTE — Progress Notes (Signed)
Subjective:    Patient ID: [name] [name], male    DOB: 01/14/1951, 63 y.o.   MRN: [id number]  HPI Pt reports he had puberty at the normal age.  He has 1 biological child.  He says he has never taken illicit androgens.  He has never been on any prescribed medication for hypogonadism.  He denies any h/o infertility.  He reports a few years of slight swelling at the breasts bilaterally, and assoc ED sxs.  He was seen at [facility], where he was dx'ed with hypogonadotropic hypogonadism.  However, pt says he wishes to f/u here in [place] instead.    Past Medical History  Diagnosis Date  . Hypertension   . Asthma   . Atrial fibrillation   . CHF (congestive heart failure)   . Anemia   . Heart disease     Past Surgical History  Procedure Laterality Date  . Esophagus surgery      History   Social History  . Marital Status: Single    Spouse Name: N/A    Number of Children: 1  . Years of Education: N/A   Occupational History  . [employer]    Social History Main Topics  . Smoking status: Never Smoker   . Smokeless tobacco: Never Used  . Alcohol Use: No  . Drug Use: No  . Sexual Activity: Not Currently   Other Topics Concern  . Not on file   Social History Narrative  . No narrative on file    Current Outpatient Prescriptions on File Prior to Visit  Medication Sig Dispense Refill  . albuterol (PROVENTIL HFA;VENTOLIN HFA) 108 (90 BASE) MCG/ACT inhaler Inhale 2 puffs into the lungs every 6 (six) hours as needed.  1 Inhaler  7  . amiodarone (PACERONE) 200 MG tablet Take 1 tablet (200 mg total) by mouth daily.  60 tablet  2  . aspirin EC 81 MG tablet Take 81 mg by mouth daily.        . ferrous sulfate 325 (65 FE) MG tablet Take 325 mg by mouth daily with breakfast.      . fluticasone-salmeterol (ADVAIR HFA) 115-21 MCG/ACT inhaler Inhale 2 puffs into the lungs daily as needed. For shortness of breath      . ketoconazole (NIZORAL) 2 % cream Apply 1 application topically every other  day. Apply to upper thigh area every other day      . lisinopril (PRINIVIL,ZESTRIL) 10 MG tablet Take 1 tablet (10 mg total) by mouth daily.  60 tablet  2  . metoCLOPramide (REGLAN) 5 MG tablet Take 1 tablet (5 mg total) by mouth 4 (four) times daily.  120 tablet  0  . nitroGLYCERIN (NITROSTAT) 0.4 MG SL tablet Place 1 tablet (0.4 mg total) under the tongue every 5 (five) minutes as needed for chest pain.  25 tablet  1  . omeprazole (PRILOSEC) 40 MG capsule Take 1 capsule (40 mg total) by mouth 2 (two) times daily before a meal.  60 capsule  3  . Vitamin D, Ergocalciferol, (DRISDOL) 50000 UNITS CAPS capsule Take 1 capsule (50,000 Units total) by mouth every 7 (seven) days.  10 capsule  2   No current facility-administered medications on file prior to visit.    No Known Allergies  Family History  Problem Relation Age of Onset  . Cancer Mother   neg for hypogonadism  BP 130/80  Pulse 83  Temp(Src) 98.3 F (36.8 C) (Oral)  Ht 5\' 7"  (1.702 m)  Wt  252 lb (114.306 kg)  BMI 39.46 kg/m2  SpO2 96%  Review of Systems denies depression, seizure, cold intolerance, numbness, weight change, decreased urinary stream, muscle weakness, fever, headache, easy bruising, rash, blurry vision, rhinorrhea, chest pain.  He says he has a chronically high-pitched voice.  He attributes doe to asthma.      Objective:   Physical Exam VS: see vs page GEN: no distress HEAD: head: no deformity eyes: no periorbital swelling, no proptosis.   external nose and ears are normal.  mouth: no lesion seen.  NECK: supple, thyroid is not enlarged. CHEST WALL: no deformity. LUNGS: clear to auscultation. BREASTS:  bilat gynecomastia and pseudogynecomastia.   CV: reg rate and rhythm, no murmur.   ABD: abdomen is soft, nontender.  no hepatosplenomegaly.  not distended.  no hernia GENITALIA:  Normal male.   MUSCULOSKELETAL: muscle bulk and strength are grossly normal.  no obvious joint swelling.  gait is normal and  steady.   EXTEMITIES: no deformity. 1+ bilat leg edema PULSES: dorsalis pedis intact bilat.  no carotid bruit NEURO:  cn 2-12 grossly intact.   readily moves all 4's.  sensation is intact to touch on all 4's. SKIN:  Normal texture and temperature.  No rash or suspicious lesion is visible.  Moderate patchy hypopigmented areas at the inguinal areas bilaterally.  Normal hair distribution. NODES:  None palpable at the neck.   PSYCH: alert, well-oriented.  Does not appear anxious nor depressed.  Lab Results  Component Value Date   TESTOSTERONE 174.39* 01/23/2014      Assessment & Plan:  Hypogonadism: apparently central. Gynecomastia: caused or exac by hypogonadism.  He declines referral for reduction surgery. ED: possibly due to hypogonadism, but i can't be certain about this yet. Sleep apnea: this could be exac by normalization of the testosterone level. Chest pain: there is a high risk of interaction between NTG and viagra. Obesity: this can exacerbate hypogonadism and gynecomastia.

## 2014-02-14 ENCOUNTER — Inpatient Hospital Stay: Payer: BC Managed Care – PPO

## 2014-02-17 ENCOUNTER — Inpatient Hospital Stay: Payer: BC Managed Care – PPO

## 2014-02-19 ENCOUNTER — Inpatient Hospital Stay: Payer: BC Managed Care – PPO

## 2014-02-27 ENCOUNTER — Encounter: Payer: Self-pay | Admitting: Internal Medicine

## 2014-02-27 ENCOUNTER — Ambulatory Visit: Payer: Medicare HMO | Attending: Internal Medicine | Admitting: Internal Medicine

## 2014-02-27 VITALS — BP 147/82 | HR 74 | Temp 98.8°F | Resp 14 | Ht 67.0 in | Wt 259.0 lb

## 2014-02-27 DIAGNOSIS — I4891 Unspecified atrial fibrillation: Secondary | ICD-10-CM | POA: Insufficient documentation

## 2014-02-27 DIAGNOSIS — E291 Testicular hypofunction: Secondary | ICD-10-CM | POA: Insufficient documentation

## 2014-02-27 DIAGNOSIS — Z09 Encounter for follow-up examination after completed treatment for conditions other than malignant neoplasm: Secondary | ICD-10-CM | POA: Insufficient documentation

## 2014-02-27 DIAGNOSIS — N529 Male erectile dysfunction, unspecified: Secondary | ICD-10-CM | POA: Insufficient documentation

## 2014-02-27 DIAGNOSIS — I519 Heart disease, unspecified: Secondary | ICD-10-CM | POA: Insufficient documentation

## 2014-02-27 DIAGNOSIS — I509 Heart failure, unspecified: Secondary | ICD-10-CM | POA: Insufficient documentation

## 2014-02-27 DIAGNOSIS — J45909 Unspecified asthma, uncomplicated: Secondary | ICD-10-CM | POA: Insufficient documentation

## 2014-02-27 DIAGNOSIS — I10 Essential (primary) hypertension: Secondary | ICD-10-CM | POA: Insufficient documentation

## 2014-02-27 DIAGNOSIS — D62 Acute posthemorrhagic anemia: Secondary | ICD-10-CM

## 2014-02-27 DIAGNOSIS — D509 Iron deficiency anemia, unspecified: Secondary | ICD-10-CM | POA: Insufficient documentation

## 2014-02-27 NOTE — Progress Notes (Signed)
Patient is here for a hospital follow up. Patient states that his BP has been consistently elevated. Patient feels that he may have a heart problem. BP today is 147/84, Pulse of 74. Patient takes medication for hypertension. Patient has no symptoms today or of hypertension. Patient states that he has anxiety. Patient did not take hypertension medication.

## 2014-02-27 NOTE — Progress Notes (Signed)
Patient ID: [name] [name], male   DOB: 29-Apr-1951, 63 y.o.   MRN: [id number]   CC:  HPI: Patient here for a followup. Recently seen by endocrinology for hypogonadism, prescribe Viagra for erectile dysfunction. Has a followup with them in one month. Patient is also anemic, and was awaiting clearance from cardiology. Given his recent negative stress test the patient should go ahead with endoscopy/colonoscopy. Hospitalized 01/24/14 with shortness of breath and palpitations. Was found to have sinus bradycardia with low-voltage. Underwent future stress test which was negative was admitted under cardiology service.  Today feels well with no complaints   No Known Allergies Past Medical History  Diagnosis Date  . Hypertension   . Asthma   . Atrial fibrillation   . CHF (congestive heart failure)   . Anemia   . Heart disease    Current Outpatient Prescriptions on File Prior to Visit  Medication Sig Dispense Refill  . albuterol (PROVENTIL HFA;VENTOLIN HFA) 108 (90 BASE) MCG/ACT inhaler Inhale 2 puffs into the lungs every 6 (six) hours as needed.  1 Inhaler  7  . amiodarone (PACERONE) 200 MG tablet Take 1 tablet (200 mg total) by mouth daily.  60 tablet  2  . aspirin EC 81 MG tablet Take 81 mg by mouth daily.        . clomiPHENE (CLOMID) 50 MG tablet 1/4 tab daily  10 tablet  2  . ferrous sulfate 325 (65 FE) MG tablet Take 325 mg by mouth daily with breakfast.      . fluticasone-salmeterol (ADVAIR HFA) 115-21 MCG/ACT inhaler Inhale 2 puffs into the lungs daily as needed. For shortness of breath      . ketoconazole (NIZORAL) 2 % cream Apply 1 application topically every other day. Apply to upper thigh area every other day      . lisinopril (PRINIVIL,ZESTRIL) 10 MG tablet Take 1 tablet (10 mg total) by mouth daily.  60 tablet  2  . metoCLOPramide (REGLAN) 5 MG tablet Take 1 tablet (5 mg total) by mouth 4 (four) times daily.  120 tablet  0  . nitroGLYCERIN (NITROSTAT) 0.4 MG SL tablet Place 1 tablet  (0.4 mg total) under the tongue every 5 (five) minutes as needed for chest pain.  25 tablet  1  . omeprazole (PRILOSEC) 40 MG capsule Take 1 capsule (40 mg total) by mouth 2 (two) times daily before a meal.  60 capsule  3  . sildenafil (VIAGRA) 100 MG tablet Take 1 tablet (100 mg total) by mouth daily as needed for erectile dysfunction.  10 tablet  10  . Vitamin D, Ergocalciferol, (DRISDOL) 50000 UNITS CAPS capsule Take 1 capsule (50,000 Units total) by mouth every 7 (seven) days.  10 capsule  2   No current facility-administered medications on file prior to visit.   Family History  Problem Relation Age of Onset  . Cancer Mother    History   Social History  . Marital Status: Single    Spouse Name: N/A    Number of Children: 1  . Years of Education: N/A   Occupational History  . [employer]    Social History Main Topics  . Smoking status: Never Smoker   . Smokeless tobacco: Never Used  . Alcohol Use: No  . Drug Use: No  . Sexual Activity: Not Currently   Other Topics Concern  . Not on file   Social History Narrative  . No narrative on file    Review of Systems  Constitutional: Negative  for fever, chills, diaphoresis, activity change, appetite change and fatigue.  HENT: Negative for ear pain, nosebleeds, congestion, facial swelling, rhinorrhea, neck pain, neck stiffness and ear discharge.   Eyes: Negative for pain, discharge, redness, itching and visual disturbance.  Respiratory: Negative for cough, choking, chest tightness, shortness of breath, wheezing and stridor.   Cardiovascular: Negative for chest pain, palpitations and leg swelling.  Gastrointestinal: Negative for abdominal distention.  Genitourinary: Negative for dysuria, urgency, frequency, hematuria, flank pain, decreased urine volume, difficulty urinating and dyspareunia.  Musculoskeletal: Negative for back pain, joint swelling, arthralgias and gait problem.  Neurological: Negative for dizziness, tremors,  seizures, syncope, facial asymmetry, speech difficulty, weakness, light-headedness, numbness and headaches.  Hematological: Negative for adenopathy. Does not bruise/bleed easily.  Psychiatric/Behavioral: Negative for hallucinations, behavioral problems, confusion, dysphoric mood, decreased concentration and agitation.    Objective:   Filed Vitals:   02/27/14 1109  BP: 147/82  Pulse: 74  Temp: 98.8 F (37.1 C)  Resp: 14    Physical Exam  Constitutional: Appears well-developed and well-nourished. No distress.  HENT: Normocephalic. External right and left ear normal. Oropharynx is clear and moist.  Eyes: Conjunctivae and EOM are normal. PERRLA, no scleral icterus.  Neck: Normal ROM. Neck supple. No JVD. No tracheal deviation. No thyromegaly.  CVS: RRR, S1/S2 +, no murmurs, no gallops, no carotid bruit.  Pulmonary: Effort and breath sounds normal, no stridor, rhonchi, wheezes, rales.  Abdominal: Soft. BS +,  no distension, tenderness, rebound or guarding.  Musculoskeletal: Normal range of motion. No edema and no tenderness.  Lymphadenopathy: No lymphadenopathy noted, cervical, inguinal. Neuro: Alert. Normal reflexes, muscle tone coordination. No cranial nerve deficit. Skin: Skin is warm and dry. No rash noted. Not diaphoretic. No erythema. No pallor.  Psychiatric: Normal mood and affect. Behavior, judgment, thought content normal.   Lab Results  Component Value Date   WBC 8.9 01/25/2014   HGB 10.2* 01/25/2014   HCT 32.5* 01/25/2014   MCV 67.3* 01/25/2014   PLT 330 01/25/2014   Lab Results  Component Value Date   CREATININE 1.11 01/25/2014   BUN 15 01/25/2014   NA 140 01/25/2014   K 4.0 01/25/2014   CL 104 01/25/2014   CO2 23 01/25/2014    Lab Results  Component Value Date   HGBA1C 5.4 01/23/2014   Lipid Panel     Component Value Date/Time   CHOL 145 01/25/2014 0544   TRIG 116 01/25/2014 0544   HDL 39* 01/25/2014 0544   CHOLHDL 3.7 01/25/2014 0544   VLDL 23 01/25/2014 0544   LDLCALC 83 01/25/2014  0544       Assessment and plan:   Patient Active Problem List   Diagnosis Date Noted  . Chest pain 01/24/2014  . Sick sinus syndrome 03/21/2013  . TINEA CRURIS 09/30/2010  . [place] HYPOGONADISM 09/23/2010  . GERD 09/01/2010  . DIZZINESS 09/01/2010  . CHEST PAIN UNSPECIFIED 09/01/2010  . SLEEP APNEA 08/20/2010  . FATIGUE 06/28/2010  . TREMOR 06/28/2010  . VITAMIN D DEFICIENCY 03/25/2010  . OTHER TESTICULAR HYPOFUNCTION 01/20/2010  . ERECTILE DYSFUNCTION 11/26/2008  . ESSENTIAL HYPERTENSION, BENIGN 11/26/2008  . [place] ANEMIA 09/24/2008  . IRON DEFIC ANEMIA [place] DIET IRON INTAKE 09/21/2008  . ATRIAL FIBRILLATION 09/21/2008  . SICK SINUS SYNDROME 09/21/2008  . ASTHMA, UNSPECIFIED, UNSPECIFIED STATUS 09/21/2008  . ESOPHAGITIS 08/26/2008  . ESOPHAGEAL MOTILITY DISORDER 08/26/2008  . GASTRITIS, ACUTE 08/26/2008   Microcytic anemia Refer back to gastroenterology for endoscopy/colonoscopy Continue ferrous sulfate  Paroxysmal atrial fibrillation under  the care of Dr.harwanii, every 3 months Currently not on anticoagulation, appears to be in sinus rhythm today  Central hypogonadism Under the care of [facility]  Follow up in 3 months      The patient was given clear instructions to go to ER or return to medical center if symptoms don't improve, worsen or new problems develop. The patient verbalized understanding. The patient was told to call to get any lab results if not heard anything in the next week.

## 2014-03-20 ENCOUNTER — Ambulatory Visit: Payer: Medicare HMO | Attending: Internal Medicine

## 2014-03-20 ENCOUNTER — Ambulatory Visit: Payer: Medicare HMO

## 2014-03-20 VITALS — BP 136/84 | HR 56 | Resp 16

## 2014-03-20 DIAGNOSIS — D62 Acute posthemorrhagic anemia: Secondary | ICD-10-CM

## 2014-03-20 DIAGNOSIS — Z0000 Encounter for general adult medical examination without abnormal findings: Secondary | ICD-10-CM

## 2014-03-20 LAB — CBC WITH DIFFERENTIAL/PLATELET
Basophils Absolute: 0.1 10*3/uL (ref 0.0–0.1)
Basophils Relative: 1 % (ref 0–1)
EOS ABS: 0.6 10*3/uL (ref 0.0–0.7)
EOS PCT: 7 % — AB (ref 0–5)
HEMATOCRIT: 34.2 % — AB (ref 39.0–52.0)
Hemoglobin: 10.7 g/dL — ABNORMAL LOW (ref 13.0–17.0)
LYMPHS PCT: 34 % (ref 12–46)
Lymphs Abs: 2.7 10*3/uL (ref 0.7–4.0)
MCH: 20.7 pg — AB (ref 26.0–34.0)
MCHC: 31.3 g/dL (ref 30.0–36.0)
MCV: 66 fL — AB (ref 78.0–100.0)
MONOS PCT: 12 % (ref 3–12)
Monocytes Absolute: 0.9 10*3/uL (ref 0.1–1.0)
Neutro Abs: 3.6 10*3/uL (ref 1.7–7.7)
Neutrophils Relative %: 46 % (ref 43–77)
PLATELETS: 395 10*3/uL (ref 150–400)
RBC: 5.18 MIL/uL (ref 4.22–5.81)
RDW: 17.3 % — ABNORMAL HIGH (ref 11.5–15.5)
WBC: 7.9 10*3/uL (ref 4.0–10.5)

## 2014-03-20 LAB — COMPLETE METABOLIC PANEL WITH GFR
ALT: 11 U/L (ref 0–53)
AST: 19 U/L (ref 0–37)
Albumin: 3.5 g/dL (ref 3.5–5.2)
Alkaline Phosphatase: 88 U/L (ref 39–117)
BILIRUBIN TOTAL: 0.4 mg/dL (ref 0.2–1.2)
BUN: 11 mg/dL (ref 6–23)
CALCIUM: 9 mg/dL (ref 8.4–10.5)
CHLORIDE: 100 meq/L (ref 96–112)
CO2: 29 meq/L (ref 19–32)
Creat: 1.07 mg/dL (ref 0.50–1.35)
GFR, EST AFRICAN AMERICAN: 84 mL/min
GFR, EST NON AFRICAN AMERICAN: 72 mL/min
GLUCOSE: 77 mg/dL (ref 70–99)
Potassium: 4 mEq/L (ref 3.5–5.3)
Sodium: 137 mEq/L (ref 135–145)
Total Protein: 7.6 g/dL (ref 6.0–8.3)

## 2014-03-20 NOTE — Progress Notes (Unsigned)
Pt is here for BP check only. 

## 2014-03-20 NOTE — Patient Instructions (Signed)
Pt is to keep taking medications as prescribed.   

## 2014-03-24 ENCOUNTER — Telehealth: Payer: Self-pay | Admitting: Emergency Medicine

## 2014-03-24 NOTE — Telephone Encounter (Signed)
Pt given lab results with GI referral information

## 2014-03-24 NOTE — Telephone Encounter (Signed)
Message copied by Ricci Barker on Tue Mar 24, 2014  5:46 PM ------      Message from: Allyson Sabal MD, Ascencion Dike      Created: Mon Mar 23, 2014 10:51 AM       Notify patient of the hemoglobin has not improved, still low at 10.7. GI referral has been provided. Patient needs to coordinate his appointment with Alinda Sierras ------

## 2014-03-26 ENCOUNTER — Ambulatory Visit: Payer: BC Managed Care – PPO | Admitting: Endocrinology

## 2014-04-06 ENCOUNTER — Encounter: Payer: Self-pay | Admitting: Internal Medicine

## 2014-04-27 ENCOUNTER — Ambulatory Visit: Payer: BC Managed Care – PPO | Admitting: Internal Medicine

## 2014-05-28 ENCOUNTER — Ambulatory Visit: Payer: Commercial Managed Care - HMO | Admitting: Internal Medicine

## 2014-06-25 ENCOUNTER — Ambulatory Visit: Payer: Medicare HMO | Admitting: Internal Medicine

## 2014-07-28 ENCOUNTER — Ambulatory Visit: Payer: Medicare HMO | Admitting: Internal Medicine

## 2014-08-04 ENCOUNTER — Ambulatory Visit: Payer: Medicare HMO | Admitting: Internal Medicine

## 2014-08-12 ENCOUNTER — Encounter: Payer: Self-pay | Admitting: Internal Medicine

## 2014-08-12 ENCOUNTER — Ambulatory Visit: Payer: Medicare HMO | Attending: Internal Medicine | Admitting: Internal Medicine

## 2014-08-12 VITALS — BP 135/86 | HR 59 | Temp 98.7°F | Resp 15 | Wt 245.4 lb

## 2014-08-12 DIAGNOSIS — K219 Gastro-esophageal reflux disease without esophagitis: Secondary | ICD-10-CM | POA: Diagnosis not present

## 2014-08-12 DIAGNOSIS — Z7982 Long term (current) use of aspirin: Secondary | ICD-10-CM | POA: Diagnosis not present

## 2014-08-12 DIAGNOSIS — I4891 Unspecified atrial fibrillation: Secondary | ICD-10-CM | POA: Diagnosis not present

## 2014-08-12 DIAGNOSIS — Z79899 Other long term (current) drug therapy: Secondary | ICD-10-CM | POA: Insufficient documentation

## 2014-08-12 DIAGNOSIS — R259 Unspecified abnormal involuntary movements: Secondary | ICD-10-CM | POA: Insufficient documentation

## 2014-08-12 DIAGNOSIS — I10 Essential (primary) hypertension: Secondary | ICD-10-CM | POA: Insufficient documentation

## 2014-08-12 DIAGNOSIS — R251 Tremor, unspecified: Secondary | ICD-10-CM

## 2014-08-12 DIAGNOSIS — I509 Heart failure, unspecified: Secondary | ICD-10-CM | POA: Diagnosis not present

## 2014-08-12 DIAGNOSIS — J45909 Unspecified asthma, uncomplicated: Secondary | ICD-10-CM

## 2014-08-12 DIAGNOSIS — Z0000 Encounter for general adult medical examination without abnormal findings: Secondary | ICD-10-CM

## 2014-08-12 MED ORDER — FLUTICASONE-SALMETEROL 115-21 MCG/ACT IN AERO: 2.0000 | INHALATION_SPRAY | Freq: Every day | RESPIRATORY_TRACT | Status: DC | PRN

## 2014-08-12 NOTE — Progress Notes (Signed)
Patient complains of not having control of his right arm Without warning his right arm starts to shake Patient stated his brother suffers from something similar and called it Regionalized epilepsy

## 2014-08-12 NOTE — Progress Notes (Signed)
MRN: [id number] Name: [name] [name]  Sex: male Age: 63 y.o. DOB: 07-Jun-1951  Allergies: Review of patient's allergies indicates no known allergies.  Chief Complaint  Patient presents with  . Arm Pain    HPI: Patient is 63 y.o. male who History of hypertension, paroxysmal A. fib asthma, GERD , hypogonadism, currently following up with her cardiologist as well as endocrinologist, today his major concern is intermittent  tremors in the right arm for the last 2 years, sometimes it happens at the rest and sometimes when he is trying his regular ADLs, denies any numbness weakness, does report her family history of tremors, as per patient the frequency currently is 2 times a week and it lasts for 1 minute. Patient denies any generalized seizures loss of consciousness.  Past Medical History  Diagnosis Date  . Hypertension   . Asthma   . Atrial fibrillation   . CHF (congestive heart failure)   . Anemia   . Heart disease     Past Surgical History  Procedure Laterality Date  . Esophagus surgery        Medication List       This list is accurate as of: 08/12/14  9:51 AM.  Always use your most recent med list.               albuterol 108 (90 BASE) MCG/ACT inhaler  Commonly known as:  PROVENTIL HFA;VENTOLIN HFA  Inhale 2 puffs into the lungs every 6 (six) hours as needed.     amiodarone 200 MG tablet  Commonly known as:  PACERONE  Take 1 tablet (200 mg total) by mouth daily.     aspirin EC 81 MG tablet  Take 81 mg by mouth daily.     clomiPHENE 50 MG tablet  Commonly known as:  CLOMID  1/4 tab daily     ferrous sulfate 325 (65 FE) MG tablet  Take 325 mg by mouth daily with breakfast.     fluticasone-salmeterol 115-21 MCG/ACT inhaler  Commonly known as:  ADVAIR HFA  Inhale 2 puffs into the lungs daily as needed. For shortness of breath     ketoconazole 2 % cream  Commonly known as:  NIZORAL  Apply 1 application topically every other day. Apply to upper thigh area  every other day     lisinopril 10 MG tablet  Commonly known as:  PRINIVIL,ZESTRIL  Take 1 tablet (10 mg total) by mouth daily.     metoCLOPramide 5 MG tablet  Commonly known as:  REGLAN  Take 1 tablet (5 mg total) by mouth 4 (four) times daily.     nitroGLYCERIN 0.4 MG SL tablet  Commonly known as:  NITROSTAT  Place 1 tablet (0.4 mg total) under the tongue every 5 (five) minutes as needed for chest pain.     omeprazole 40 MG capsule  Commonly known as:  PRILOSEC  Take 1 capsule (40 mg total) by mouth 2 (two) times daily before a meal.     sildenafil 100 MG tablet  Commonly known as:  VIAGRA  Take 1 tablet (100 mg total) by mouth daily as needed for erectile dysfunction.     Vitamin D (Ergocalciferol) 50000 UNITS Caps capsule  Commonly known as:  DRISDOL  Take 1 capsule (50,000 Units total) by mouth every 7 (seven) days.        No orders of the defined types were placed in this encounter.    There is no immunization history for the selected administration  types on file for this patient.  Family History  Problem Relation Age of Onset  . Cancer Mother     History  Substance Use Topics  . Smoking status: Never Smoker   . Smokeless tobacco: Never Used  . Alcohol Use: No    Review of Systems   As noted in HPI  Filed Vitals:   08/12/14 0925  BP: 135/86  Pulse: 59  Temp: 98.7 F (37.1 C)  Resp: 15    Physical Exam  Physical Exam  Constitutional: He is oriented to person, place, and time. No distress.  Eyes: EOM are normal. Pupils are equal, round, and reactive to light.  Cardiovascular: Normal rate and regular rhythm.   Pulmonary/Chest: Breath sounds normal. No respiratory distress. He has no wheezes. He has no rales.  Musculoskeletal: He exhibits no edema.  Neurological: He is alert and oriented to person, place, and time. He has normal reflexes.  No resting tremors, finger nose test negative, normal rapid alternating movements of hands/arms, equal  strength all extremities     CBC    Component Value Date/Time   WBC 7.9 03/20/2014 1118   RBC 5.18 03/20/2014 1118   RBC 4.97 08/03/2010 1557   HGB 10.7* 03/20/2014 1118   HCT 34.2* 03/20/2014 1118   PLT 395 03/20/2014 1118   MCV 66.0* 03/20/2014 1118   LYMPHSABS 2.7 03/20/2014 1118   MONOABS 0.9 03/20/2014 1118   EOSABS 0.6 03/20/2014 1118   BASOSABS 0.1 03/20/2014 1118    CMP     Component Value Date/Time   NA 137 03/20/2014 1118   K 4.0 03/20/2014 1118   CL 100 03/20/2014 1118   CO2 29 03/20/2014 1118   GLUCOSE 77 03/20/2014 1118   BUN 11 03/20/2014 1118   CREATININE 1.07 03/20/2014 1118   CREATININE 1.11 01/25/2014 0544   CALCIUM 9.0 03/20/2014 1118   PROT 7.6 03/20/2014 1118   ALBUMIN 3.5 03/20/2014 1118   AST 19 03/20/2014 1118   ALT 11 03/20/2014 1118   ALKPHOS 88 03/20/2014 1118   BILITOT 0.4 03/20/2014 1118   GFRNONAA 72 03/20/2014 1118   GFRNONAA 68* 01/25/2014 0544   GFRAA 84 03/20/2014 1118   GFRAA 79* 01/25/2014 0544    Lab Results  Component Value Date/Time   CHOL 145 01/25/2014  5:44 AM    No components found with this basename: [id number]    Lab Results  Component Value Date/Time   AST 19 03/20/2014 11:18 AM    Assessment and Plan  Unspecified asthma(493.90) Symptoms are stable we will continue with Advair and albuterol when necessary. Patient denies smoking cigarettes.  Preventative health care - Plan: Blood work ordered Lipid Panel, Hepatic Function Panel, Basic Metabolic Panel, TSH  Intermittent tremor - Plan: Differential includes hemiballismus/chorea or intentional tremors or seizures, patient denies any history of stroke Ambulatory referral to Neurology for further evaluation.  Essential hypertension Blood pressure is well-controlled continued current medications   Return in about 3 months (around 11/12/2014).  Lorayne Marek, MD

## 2014-08-19 ENCOUNTER — Other Ambulatory Visit: Payer: Medicare HMO

## 2014-08-24 ENCOUNTER — Ambulatory Visit: Payer: Medicare HMO | Attending: Internal Medicine

## 2014-08-24 DIAGNOSIS — Z0000 Encounter for general adult medical examination without abnormal findings: Secondary | ICD-10-CM

## 2014-08-24 LAB — HEPATIC FUNCTION PANEL
ALT: 9 U/L (ref 0–53)
AST: 17 U/L (ref 0–37)
Albumin: 3.5 g/dL (ref 3.5–5.2)
Alkaline Phosphatase: 87 U/L (ref 39–117)
BILIRUBIN INDIRECT: 0.4 mg/dL (ref 0.2–1.2)
Bilirubin, Direct: 0.1 mg/dL (ref 0.0–0.3)
Total Bilirubin: 0.5 mg/dL (ref 0.2–1.2)
Total Protein: 7.5 g/dL (ref 6.0–8.3)

## 2014-08-24 LAB — LIPID PANEL
CHOLESTEROL: 138 mg/dL (ref 0–200)
HDL: 38 mg/dL — ABNORMAL LOW (ref >39)
LDL CALC: 77 mg/dL (ref 0–99)
Total CHOL/HDL Ratio: 3.6 Ratio
Triglycerides: 113 mg/dL (ref <150)
VLDL: 23 mg/dL (ref 0–40)

## 2014-08-24 LAB — BASIC METABOLIC PANEL
BUN: 10 mg/dL (ref 6–23)
CALCIUM: 9.3 mg/dL (ref 8.4–10.5)
CHLORIDE: 103 meq/L (ref 96–112)
CO2: 30 meq/L (ref 19–32)
Creat: 1.15 mg/dL (ref 0.50–1.35)
Glucose, Bld: 89 mg/dL (ref 70–99)
Potassium: 3.7 mEq/L (ref 3.5–5.3)
Sodium: 142 mEq/L (ref 135–145)

## 2014-08-24 LAB — TSH: TSH: 1.707 u[IU]/mL (ref 0.350–4.500)

## 2014-08-25 ENCOUNTER — Encounter: Payer: Self-pay | Admitting: Neurology

## 2014-08-25 ENCOUNTER — Ambulatory Visit (INDEPENDENT_AMBULATORY_CARE_PROVIDER_SITE_OTHER): Payer: Medicare HMO | Admitting: Neurology

## 2014-08-25 VITALS — BP 124/79 | HR 67 | Ht 67.0 in | Wt 247.0 lb

## 2014-08-25 DIAGNOSIS — F528 Other sexual dysfunction not due to a substance or known physiological condition: Secondary | ICD-10-CM

## 2014-08-25 DIAGNOSIS — E559 Vitamin D deficiency, unspecified: Secondary | ICD-10-CM

## 2014-08-25 DIAGNOSIS — G4730 Sleep apnea, unspecified: Secondary | ICD-10-CM

## 2014-08-25 DIAGNOSIS — R259 Unspecified abnormal involuntary movements: Secondary | ICD-10-CM

## 2014-08-25 NOTE — Progress Notes (Signed)
PATIENT: [name] [name] DOB: Nov 08, 1951  HISTORICAL Patient is a 63 years old African American male, referred by his primary care physician Dr. Annitta Needs for evaluation of recurrent right arm shaking  He has past medical history of hypertension, paroxysmal A. fib asthma, GERD , hypogonadism Over the past 2 to 3 years, around 2012 he began to have stereotypical episodes of sudden onset involuntary right arm large amplitude shaking, was no right face, leg involvement, no loss of consciousness, lasting 2-3 minutes, it happened, average couple times each week, no generalized seizure  He reported that his brother, and father had similar event, was diagnosed with epilepsy  Over the years, he has gradually developed feminine features, including gynecomastia, voice change, erectile dysfunction, was recently evaluated by her endocrinologist.  REVIEW OF SYSTEMS: Full 14 system review of systems performed and notable only for fatigue, shortness of breath, wheezing, snoring, anemia, headaches, dizziness, tremor, depression, recent pulse, erectile dysfunction, rash, itching ALLERGIES: No Known Allergies  HOME MEDICATIONS: Current Outpatient Prescriptions on File Prior to Visit  Medication Sig Dispense Refill  . albuterol (PROVENTIL HFA;VENTOLIN HFA) 108 (90 BASE) MCG/ACT inhaler Inhale 2 puffs into the lungs every 6 (six) hours as needed.  1 Inhaler  7  . amiodarone (PACERONE) 200 MG tablet Take 1 tablet (200 mg total) by mouth daily.  60 tablet  2  . aspirin EC 81 MG tablet Take 81 mg by mouth daily.        . clomiPHENE (CLOMID) 50 MG tablet 1/4 tab daily  10 tablet  2  . ferrous sulfate 325 (65 FE) MG tablet Take 325 mg by mouth daily with breakfast.      . fluticasone-salmeterol (ADVAIR HFA) 115-21 MCG/ACT inhaler Inhale 2 puffs into the lungs daily as needed. For shortness of breath  1 Inhaler  1  . ketoconazole (NIZORAL) 2 % cream Apply 1 application topically every other day. Apply to upper thigh  area every other day      . lisinopril (PRINIVIL,ZESTRIL) 10 MG tablet Take 1 tablet (10 mg total) by mouth daily.  60 tablet  2  . metoCLOPramide (REGLAN) 5 MG tablet Take 1 tablet (5 mg total) by mouth 4 (four) times daily.  120 tablet  0  . nitroGLYCERIN (NITROSTAT) 0.4 MG SL tablet Place 1 tablet (0.4 mg total) under the tongue every 5 (five) minutes as needed for chest pain.  25 tablet  1  . omeprazole (PRILOSEC) 40 MG capsule Take 1 capsule (40 mg total) by mouth 2 (two) times daily before a meal.  60 capsule  3  . sildenafil (VIAGRA) 100 MG tablet Take 1 tablet (100 mg total) by mouth daily as needed for erectile dysfunction.  10 tablet  10  . Vitamin D, Ergocalciferol, (DRISDOL) 50000 UNITS CAPS capsule Take 1 capsule (50,000 Units total) by mouth every 7 (seven) days.  10 capsule  2     PAST MEDICAL HISTORY: Past Medical History  Diagnosis Date  . Hypertension   . Asthma   . Atrial fibrillation   . CHF (congestive heart failure)   . Anemia   . Heart disease     PAST SURGICAL HISTORY: Past Surgical History  Procedure Laterality Date  . Esophagus surgery      FAMILY HISTORY: Family History  Problem Relation Age of Onset  . Cancer Mother     SOCIAL HISTORY:  History   Social History  . Marital Status: Single    Spouse Name: N/A  Number of Children: 1  . Years of Education: college   Occupational History  . [employer]    Social History Main Topics  . Smoking status: Never Smoker   . Smokeless tobacco: Never Used  . Alcohol Use: No  . Drug Use: No  . Sexual Activity: Not Currently   Other Topics Concern  . Not on file   Social History Narrative  . No narrative on file   PHYSICAL EXAM   Filed Vitals:   08/25/14 1313  BP: 124/79  Pulse: 67  Height: 5\' 7"  (1.702 m)  Weight: 247 lb (112.038 kg)    Not recorded    Body mass index is 38.68 kg/(m^2).   Generalized: In no acute distress  Neck: Supple, no carotid bruits   Cardiac: Regular  rate rhythm  Pulmonary: Clear to auscultation bilaterally, gynecomastia  Musculoskeletal: No deformity  Neurological examination  Mentation: Alert oriented to time, place, history taking, and causual conversation  Cranial nerve II-XII: Pupils were equal round reactive to light. Extraocular movements were full.  Visual field were full on confrontational test. Bilateral fundi were sharp.  Facial sensation and strength were normal. Hearing was intact to finger rubbing bilaterally. Uvula tongue midline.  Head turning and shoulder shrug and were normal and symmetric.Tongue protrusion into cheek strength was normal.  Motor: Normal tone, bulk and strength.  Sensory: Intact to fine touch, pinprick, preserved vibratory sensation, and proprioception at toes.  Coordination: Normal finger to nose, heel-to-shin bilaterally there was no truncal ataxia  Gait: Rising up from seated position without assistance, normal stance, without trunk ataxia, moderate stride, good arm swing, smooth turning, able to perform tiptoe, and heel walking without difficulty.   Romberg signs: Negative  Deep tendon reflexes: Brachioradialis 2/2, biceps 2/2, triceps 2/2, patellar 2/2, Achilles 2/2, plantar responses were flexor bilaterally.   DIAGNOSTIC DATA (LABS, IMAGING, TESTING) - I reviewed patient records, labs, notes, testing and imaging myself where available.  Lab Results  Component Value Date   WBC 7.9 03/20/2014   HGB 10.7* 03/20/2014   HCT 34.2* 03/20/2014   MCV 66.0* 03/20/2014   PLT 395 03/20/2014      Component Value Date/Time   NA 142 08/24/2014 0911   K 3.7 08/24/2014 0911   CL 103 08/24/2014 0911   CO2 30 08/24/2014 0911   GLUCOSE 89 08/24/2014 0911   BUN 10 08/24/2014 0911   CREATININE 1.15 08/24/2014 0911   CREATININE 1.11 01/25/2014 0544   CALCIUM 9.3 08/24/2014 0911   PROT 7.5 08/24/2014 0911   ALBUMIN 3.5 08/24/2014 0911   AST 17 08/24/2014 0911   ALT 9 08/24/2014 0911   ALKPHOS 87 08/24/2014 0911    BILITOT 0.5 08/24/2014 0911   GFRNONAA 72 03/20/2014 1118   GFRNONAA 68* 01/25/2014 0544   GFRAA 84 03/20/2014 1118   GFRAA 79* 01/25/2014 0544   Lab Results  Component Value Date   CHOL 138 08/24/2014   HDL 38* 08/24/2014   LDLCALC 77 08/24/2014   TRIG 113 08/24/2014   CHOLHDL 3.6 08/24/2014   Lab Results  Component Value Date   HGBA1C 5.4 01/23/2014   Lab Results  Component Value Date   VITAMINB12 325 08/03/2010   Lab Results  Component Value Date   TSH 1.707 08/24/2014      ASSESSMENT AND PLAN  [name] [name] is a 63 y.o. male with recurrent episode of involuntary right arm shaking, suggestive of simple partial seizure  1., complete evaluation with MRI of the brain with  and without contrast  2. EEG  3 return to clinic in one month's   Marcial Pacas, M.D. Ph.D.  [facility] [address] [place], [place] [address] [phone]

## 2014-08-28 ENCOUNTER — Ambulatory Visit (INDEPENDENT_AMBULATORY_CARE_PROVIDER_SITE_OTHER): Payer: Medicare HMO | Admitting: Radiology

## 2014-08-28 DIAGNOSIS — F528 Other sexual dysfunction not due to a substance or known physiological condition: Secondary | ICD-10-CM

## 2014-08-28 DIAGNOSIS — R259 Unspecified abnormal involuntary movements: Secondary | ICD-10-CM

## 2014-08-28 DIAGNOSIS — G4730 Sleep apnea, unspecified: Secondary | ICD-10-CM

## 2014-08-28 DIAGNOSIS — E559 Vitamin D deficiency, unspecified: Secondary | ICD-10-CM

## 2014-08-28 NOTE — Procedures (Signed)
   HISTORY:  63 years old male, presenting with 2 years history of recurrent episode of sudden onset involuntary right arm shaking.  TECHNIQUE:  16 channel EEG was performed based on standard 10-16 international system. One channel was dedicated to EKG, which has demonstrates normal sinus rhythm of 72 beats per minutes.  Upon awakening, the posterior background activity was well-developed, in alpha range, reactive to eye opening and closure.  There was no evidence of epileptiform discharge.  Photic stimulation was performed, which induced a symmetric photic driving.  Hyperventilation was performed, there was no abnormality elicit.  Stage II sleep was achieved as evident by K complex, and sleep spindles..  CONCLUSION: This is a  normal awake and asleep EEG.  There is no electrodiagnostic evidence of epileptiform discharge

## 2014-09-07 ENCOUNTER — Other Ambulatory Visit: Payer: Medicare HMO

## 2014-09-16 ENCOUNTER — Other Ambulatory Visit: Payer: Medicare HMO

## 2014-09-29 ENCOUNTER — Telehealth: Payer: Self-pay | Admitting: Neurology

## 2014-09-29 NOTE — Telephone Encounter (Signed)
Patient needs to be scheduled for this week. Patient  Has not had MRI Yet . Will call patient back and get him rescheduled.

## 2014-09-30 NOTE — Telephone Encounter (Signed)
Patient had to be rescheduled with Dr.Yan be cause he was not scheduled yet for his MRI.

## 2014-10-01 ENCOUNTER — Ambulatory Visit: Payer: Medicare HMO | Admitting: Neurology

## 2014-10-06 ENCOUNTER — Ambulatory Visit
Admission: RE | Admit: 2014-10-06 | Discharge: 2014-10-06 | Disposition: A | Discharge status: Home / Self Care | Payer: Medicare HMO | Source: Ambulatory Visit | Attending: Neurology | Admitting: Neurology

## 2014-10-06 DIAGNOSIS — R251 Tremor, unspecified: Secondary | ICD-10-CM

## 2014-10-06 DIAGNOSIS — F528 Other sexual dysfunction not due to a substance or known physiological condition: Secondary | ICD-10-CM

## 2014-10-06 DIAGNOSIS — G4730 Sleep apnea, unspecified: Secondary | ICD-10-CM

## 2014-10-06 DIAGNOSIS — E559 Vitamin D deficiency, unspecified: Secondary | ICD-10-CM

## 2014-10-06 MED ORDER — GADOBENATE DIMEGLUMINE 529 MG/ML IV SOLN
20.0000 mL | Freq: Once | INTRAVENOUS | Status: AC | PRN
  Administered 2014-10-06: 20 mL via INTRAVENOUS

## 2014-10-08 ENCOUNTER — Telehealth: Payer: Self-pay | Admitting: Neurology

## 2014-10-08 NOTE — Telephone Encounter (Signed)
Review MRI in Oct 16

## 2014-10-09 ENCOUNTER — Encounter: Payer: Self-pay | Admitting: Neurology

## 2014-10-09 ENCOUNTER — Ambulatory Visit (INDEPENDENT_AMBULATORY_CARE_PROVIDER_SITE_OTHER): Payer: Medicare HMO | Admitting: Neurology

## 2014-10-09 VITALS — BP 142/80 | HR 65 | Ht 67.0 in | Wt 248.0 lb

## 2014-10-09 DIAGNOSIS — I482 Chronic atrial fibrillation, unspecified: Secondary | ICD-10-CM

## 2014-10-09 DIAGNOSIS — I10 Essential (primary) hypertension: Secondary | ICD-10-CM

## 2014-10-09 DIAGNOSIS — R569 Unspecified convulsions: Secondary | ICD-10-CM | POA: Insufficient documentation

## 2014-10-09 DIAGNOSIS — G4730 Sleep apnea, unspecified: Secondary | ICD-10-CM

## 2014-10-09 DIAGNOSIS — E559 Vitamin D deficiency, unspecified: Secondary | ICD-10-CM

## 2014-10-09 MED ORDER — LEVETIRACETAM 500 MG PO TABS: 500.0000 mg | ORAL_TABLET | Freq: Two times a day (BID) | ORAL | Status: DC

## 2014-10-09 NOTE — Progress Notes (Signed)
PATIENT: [name] [name] DOB: [birth year]  HISTORICAL Patient is a 63 years old African American male, referred by his primary care physician Dr. Annitta Needs for evaluation of recurrent right arm shaking  He has past medical history of hypertension, paroxysmal A. fib asthma, GERD , hypogonadism  Over the past 2 to 3 years, around 2012 he began to have stereotypical episodes of sudden onset involuntary right arm large amplitude shaking, with no right face, leg involvement, no loss of consciousness, lasting 2-3 minutes, it happened, average couple times each week, no generalized seizure  He reported that his brother, and father had similar event, was diagnosed with epilepsy  Over the years, he has gradually developed feminine features, including gynecomastia, voice change, erectile dysfunction, was recently evaluated by her endocrinologist.  UPDATE Oct 16th 2015:  He continued to have recurrent right upper extremity uncontrollable shaking, each episode lasts about 2 to 3 minutes, he has average 3 episode each week, he has no loss of consciousness  EEG was normal,  We have reviewed the MRI together, there was evidence of multiple periventricular white matter disease, most likely small vessel disease,  REVIEW OF SYSTEMS: Full 14 system review of systems performed and notable only for fatigue, shortness of breath, daytime sleepiness, snoring, anemia, tremor  Allergy No Known Allergies  HOME MEDICATIONS: Current Outpatient Prescriptions on File Prior to Visit  Medication Sig Dispense Refill  . albuterol (PROVENTIL HFA;VENTOLIN HFA) 108 (90 BASE) MCG/ACT inhaler Inhale 2 puffs into the lungs every 6 (six) hours as needed.  1 Inhaler  7  . amiodarone (PACERONE) 200 MG tablet Take 1 tablet (200 mg total) by mouth daily.  60 tablet  2  . aspirin EC 81 MG tablet Take 81 mg by mouth daily.        . clomiPHENE (CLOMID) 50 MG tablet 1/4 tab daily  10 tablet  2  . ferrous sulfate 325 (65 FE) MG tablet  Take 325 mg by mouth daily with breakfast.      . fluticasone-salmeterol (ADVAIR HFA) 115-21 MCG/ACT inhaler Inhale 2 puffs into the lungs daily as needed. For shortness of breath  1 Inhaler  1  . ketoconazole (NIZORAL) 2 % cream Apply 1 application topically every other day. Apply to upper thigh area every other day      . lisinopril (PRINIVIL,ZESTRIL) 10 MG tablet Take 1 tablet (10 mg total) by mouth daily.  60 tablet  2  . metoCLOPramide (REGLAN) 5 MG tablet Take 1 tablet (5 mg total) by mouth 4 (four) times daily.  120 tablet  0  . nitroGLYCERIN (NITROSTAT) 0.4 MG SL tablet Place 1 tablet (0.4 mg total) under the tongue every 5 (five) minutes as needed for chest pain.  25 tablet  1  . omeprazole (PRILOSEC) 40 MG capsule Take 1 capsule (40 mg total) by mouth 2 (two) times daily before a meal.  60 capsule  3  . sildenafil (VIAGRA) 100 MG tablet Take 1 tablet (100 mg total) by mouth daily as needed for erectile dysfunction.  10 tablet  10  . Vitamin D, Ergocalciferol, (DRISDOL) 50000 UNITS CAPS capsule Take 1 capsule (50,000 Units total) by mouth every 7 (seven) days.  10 capsule  2     PAST MEDICAL HISTORY: Past Medical History  Diagnosis Date  . Hypertension   . Asthma   . Atrial fibrillation   . CHF (congestive heart failure)   . Anemia   . Heart disease     PAST SURGICAL  HISTORY: Past Surgical History  Procedure Laterality Date  . Esophagus surgery      FAMILY HISTORY: Family History  Problem Relation Age of Onset  . Cancer Mother     SOCIAL HISTORY:  History   Social History  . Marital Status: Single    Spouse Name: N/A    Number of Children: 1  . Years of Education: college   Occupational History  . [employer]    Social History Main Topics  . Smoking status: Never Smoker   . Smokeless tobacco: Never Used  . Alcohol Use: No  . Drug Use: No  . Sexual Activity: Not Currently   Other Topics Concern  . Not on file   Social History Narrative  . No  narrative on file   PHYSICAL EXAM   Filed Vitals:   10/09/14 1105  BP: 142/80  Pulse: 65  Height: 5\' 7"  (1.702 m)  Weight: 248 lb (112.492 kg)    Not recorded    Body mass index is 38.83 kg/(m^2).   Generalized: In no acute distress  Neck: Supple, no carotid bruits   Cardiac: Regular rate rhythm  Pulmonary: Clear to auscultation bilaterally, gynecomastia  Musculoskeletal: No deformity  Neurological examination  Mentation: Alert oriented to time, place, history taking, and causual conversation  Cranial nerve II-XII: Pupils were equal round reactive to light. Extraocular movements were full.  Visual field were full on confrontational test. Bilateral fundi were sharp.  Facial sensation and strength were normal. Hearing was intact to finger rubbing bilaterally. Uvula tongue midline.  Head turning and shoulder shrug and were normal and symmetric.Tongue protrusion into cheek strength was normal.  Motor: Normal tone, bulk and strength.  Sensory: Intact to fine touch, pinprick, preserved vibratory sensation, and proprioception at toes.  Coordination: Normal finger to nose, heel-to-shin bilaterally there was no truncal ataxia  Gait: Rising up from seated position without assistance, normal stance, without trunk ataxia, moderate stride, good arm swing, smooth turning, able to perform tiptoe, and heel walking without difficulty.   Romberg signs: Negative  Deep tendon reflexes: Brachioradialis 2/2, biceps 2/2, triceps 2/2, patellar 2/2, Achilles 2/2, plantar responses were flexor bilaterally.   DIAGNOSTIC DATA (LABS, IMAGING, TESTING) - I reviewed patient records, labs, notes, testing and imaging myself where available.  Lab Results  Component Value Date   WBC 7.9 03/20/2014   HGB 10.7* 03/20/2014   HCT 34.2* 03/20/2014   MCV 66.0* 03/20/2014   PLT 395 03/20/2014      Component Value Date/Time   NA 142 08/24/2014 0911   K 3.7 08/24/2014 0911   CL 103 08/24/2014 0911   CO2 30  08/24/2014 0911   GLUCOSE 89 08/24/2014 0911   BUN 10 08/24/2014 0911   CREATININE 1.15 08/24/2014 0911   CREATININE 1.11 01/25/2014 0544   CALCIUM 9.3 08/24/2014 0911   PROT 7.5 08/24/2014 0911   ALBUMIN 3.5 08/24/2014 0911   AST 17 08/24/2014 0911   ALT 9 08/24/2014 0911   ALKPHOS 87 08/24/2014 0911   BILITOT 0.5 08/24/2014 0911   GFRNONAA 72 03/20/2014 1118   GFRNONAA 68* 01/25/2014 0544   GFRAA 84 03/20/2014 1118   GFRAA 79* 01/25/2014 0544   Lab Results  Component Value Date   CHOL 138 08/24/2014   HDL 38* 08/24/2014   LDLCALC 77 08/24/2014   TRIG 113 08/24/2014   CHOLHDL 3.6 08/24/2014   Lab Results  Component Value Date   HGBA1C 5.4 01/23/2014   Lab Results  Component Value Date  SWHQPRFF63 325 08/03/2010   Lab Results  Component Value Date   TSH 1.707 08/24/2014      ASSESSMENT AND PLAN  Crystian Frith is a 63 y.o. male with recurrent episode of involuntary right arm shaking, suggestive of simple partial seizure  1. Starting Keppra 500 mg twice a day 2. He has abnormal MRI of the brain, most consistent with small vessel disease, laboratory evaluation to rule out other etiologies, such as infectious, inflammatory  Process. 3. Return to clinic in 2 3 months  Marcial Pacas, M.D. Ph.D.  [facility] [address], [place], [place] [address] [phone]

## 2014-10-10 LAB — ANA W/REFLEX IF POSITIVE
Anti JO-1: <0.2 AI (ref 0.0–0.9)
Anti Nuclear Antibody(ANA): POSITIVE — AB
Chromatin Ab SerPl-aCnc: 0.2 AI (ref 0.0–0.9)
DSDNA AB: 17 [IU]/mL — AB (ref 0–9)
ENA SM AB SER-ACNC: 0.2 AI (ref 0.0–0.9)
ENA SSA (RO) Ab: <0.2 AI (ref 0.0–0.9)
ENA SSB (LA) Ab: <0.2 AI (ref 0.0–0.9)
Scleroderma SCL-70: 0.4 AI (ref 0.0–0.9)

## 2014-10-10 LAB — HIV ANTIBODY (ROUTINE TESTING W REFLEX)
HIV 1/O/2 Abs-Index Value: <1 (ref <1.00)
HIV-1/HIV-2 Ab: NONREACTIVE

## 2014-10-10 LAB — VITAMIN B12: Vitamin B-12: 313 pg/mL (ref 211–946)

## 2014-10-10 LAB — RPR: RPR: NONREACTIVE

## 2014-10-10 LAB — SEDIMENTATION RATE: SED RATE: 54 mm/h — AB (ref 0–30)

## 2014-10-10 LAB — FOLATE: FOLATE: 10.4 ng/mL (ref >3.0)

## 2014-10-14 ENCOUNTER — Telehealth: Payer: Self-pay | Admitting: Neurology

## 2014-10-14 NOTE — Telephone Encounter (Signed)
I have called him, laboratory showed elevated ESR 57, positive ANA, double-stranded DNA, I will fax the result to his primary care physician, he should contact his primary care physician for continued evaluation    Dana: Please printout a copy for him to pick up

## 2014-10-16 NOTE — Telephone Encounter (Signed)
Called patient and spoke to him relayed labs Patient wants to come pick up his labs and take to his PCP.

## 2014-10-19 ENCOUNTER — Telehealth: Payer: Self-pay | Admitting: Internal Medicine

## 2014-10-19 ENCOUNTER — Telehealth: Payer: Self-pay | Admitting: Emergency Medicine

## 2014-10-19 NOTE — Telephone Encounter (Signed)
Patient dropped medical records from [facility] and he would like to know the results. Please follow up with Patient.

## 2014-10-19 NOTE — Telephone Encounter (Signed)
After speaking with pt for clarification of papers dropped off. Pt states he dropped MRI results from [facility] office for provider to review. I explained to pt, Neurology office should have given him the results but I will further discuss with Dr. Annitta Needs. Instructed pt to call Neurology office back for results

## 2014-10-21 ENCOUNTER — Other Ambulatory Visit: Payer: Self-pay

## 2014-10-21 DIAGNOSIS — R768 Other specified abnormal immunological findings in serum: Secondary | ICD-10-CM

## 2014-12-10 ENCOUNTER — Encounter: Payer: Self-pay | Admitting: Neurology

## 2014-12-10 ENCOUNTER — Ambulatory Visit (INDEPENDENT_AMBULATORY_CARE_PROVIDER_SITE_OTHER): Payer: Commercial Managed Care - HMO | Admitting: Neurology

## 2014-12-10 VITALS — BP 139/81 | HR 68 | Ht 67.0 in | Wt 251.0 lb

## 2014-12-10 DIAGNOSIS — I482 Chronic atrial fibrillation, unspecified: Secondary | ICD-10-CM

## 2014-12-10 DIAGNOSIS — G4730 Sleep apnea, unspecified: Secondary | ICD-10-CM

## 2014-12-10 DIAGNOSIS — R569 Unspecified convulsions: Secondary | ICD-10-CM

## 2014-12-10 DIAGNOSIS — I10 Essential (primary) hypertension: Secondary | ICD-10-CM

## 2014-12-10 MED ORDER — LEVETIRACETAM 1000 MG PO TABS: 1000.0000 mg | ORAL_TABLET | Freq: Two times a day (BID) | ORAL | Status: DC

## 2014-12-10 NOTE — Progress Notes (Signed)
PATIENT: [name] [name] DOB: 1951-08-09  HISTORICAL Patient is a 63 years old African American male, referred by his primary care physician Dr. Annitta Needs for evaluation of recurrent right arm shaking  He has past medical history of hypertension, paroxysmal A. fib asthma, GERD , hypogonadism  Since 2012 he began to have stereotypical episodes of sudden onset involuntary right arm large amplitude shaking, with no right face, leg involvement, no loss of consciousness, lasting 2-3 minutes, it happened, average couple times each week, no generalized seizure  He reported that his brother, and father had similar event, was diagnosed with epilepsy  Over the years, he has gradually developed feminine features, including gynecomastia, voice change, erectile dysfunction, is under evaluation of  his endocrinologist.  EEG was normal, MRI brain Oct 2015 there was evidence of multiple periventricular white matter disease, most likely small vessel disease  UPDATE Dec 17th 2015:  I have started Keppra 500 mg twice a day since October 2015, he tolerated the medication very well, there was significant less episodes, most recent December 09 2014, sudden onset uncontrollable large amplitude forceful right arm shaking, no loss of consciousness, lasting for 1 minutes,  Laboratory showed elevated ESR 57, positive ANA, double-stranded DNA, normal K93, folic acid, negative RPR, HIV.  REVIEW OF SYSTEMS: Full 14 system review of systems performed and notable only for wheezing, shortness of breath, fatigue  Allergy No Known Allergies  HOMogetherE MEDICATIONS: Current Outpatient Prescriptions on File Prior to Visit  Medication Sig Dispense Refill  . albuterol (PROVENTIL HFA;VENTOLIN HFA) 108 (90 BASE) MCG/ACT inhaler Inhale 2 puffs into the lungs every 6 (six) hours as needed.  1 Inhaler  7  . amiodarone (PACERONE) 200 MG tablet Take 1 tablet (200 mg total) by mouth daily.  60 tablet  2  . aspirin EC 81 MG tablet  Take 81 mg by mouth daily.        . clomiPHENE (CLOMID) 50 MG tablet 1/4 tab daily  10 tablet  2  . ferrous sulfate 325 (65 FE) MG tablet Take 325 mg by mouth daily with breakfast.      . fluticasone-salmeterol (ADVAIR HFA) 115-21 MCG/ACT inhaler Inhale 2 puffs into the lungs daily as needed. For shortness of breath  1 Inhaler  1  . ketoconazole (NIZORAL) 2 % cream Apply 1 application topically every other day. Apply to upper thigh area every other day      . lisinopril (PRINIVIL,ZESTRIL) 10 MG tablet Take 1 tablet (10 mg total) by mouth daily.  60 tablet  2  . metoCLOPramide (REGLAN) 5 MG tablet Take 1 tablet (5 mg total) by mouth 4 (four) times daily.  120 tablet  0  . nitroGLYCERIN (NITROSTAT) 0.4 MG SL tablet Place 1 tablet (0.4 mg total) under the tongue every 5 (five) minutes as needed for chest pain.  25 tablet  1  . omeprazole (PRILOSEC) 40 MG capsule Take 1 capsule (40 mg total) by mouth 2 (two) times daily before a meal.  60 capsule  3  . sildenafil (VIAGRA) 100 MG tablet Take 1 tablet (100 mg total) by mouth daily as needed for erectile dysfunction.  10 tablet  10  . Vitamin D, Ergocalciferol, (DRISDOL) 50000 UNITS CAPS capsule Take 1 capsule (50,000 Units total) by mouth every 7 (seven) days.  10 capsule  2     PAST MEDICAL HISTORY: Past Medical History  Diagnosis Date  . Hypertension   . Asthma   . Atrial fibrillation   . CHF (  congestive heart failure)   . Anemia   . Heart disease     PAST SURGICAL HISTORY: Past Surgical History  Procedure Laterality Date  . Esophagus surgery      FAMILY HISTORY: Family History  Problem Relation Age of Onset  . Cancer Mother     SOCIAL HISTORY:  History   Social History  . Marital Status: Single    Spouse Name: N/A    Number of Children: 1  . Years of Education: college   Occupational History  . [employer]    Social History Main Topics  . Smoking status: Never Smoker   . Smokeless tobacco: Never Used  . Alcohol Use:  No  . Drug Use: No  . Sexual Activity: Not Currently   Other Topics Concern  . Not on file   Social History Narrative   Patient lives at home alone and he is single.   Patient is semi retired.    Education college.   Right handed.   Caffeine two cokes daily.   PHYSICAL EXAM   Filed Vitals:   12/10/14 1015  BP: 139/81  Pulse: 68  Height: _0  (1.702 m)  Weight: 251 lb (113.853 kg)    Not recorded      Body mass index is 39.3 kg/(m^2).   Generalized: In no acute distress  Neck: Supple, no carotid bruits   Cardiac: Regular rate rhythm  Pulmonary: Clear to auscultation bilaterally, gynecomastia  Musculoskeletal: No deformity  Neurological examination  Mentation: Alert oriented to time, place, history taking, and causual conversation  Cranial nerve II-XII: Pupils were equal round reactive to light. Extraocular movements were full.  Visual field were full on confrontational test. Bilateral fundi were sharp.  Facial sensation and strength were normal. Hearing was intact to finger rubbing bilaterally. Uvula tongue midline.  Head turning and shoulder shrug and were normal and symmetric.Tongue protrusion into cheek strength was normal.  Motor: Normal tone, bulk and strength.  Sensory: Intact to fine touch, pinprick, preserved vibratory sensation, and proprioception at toes.  Coordination: Normal finger to nose, heel-to-shin bilaterally there was no truncal ataxia  Gait: Rising up from seated position without assistance, normal stance, without trunk ataxia, moderate stride, good arm swing, smooth turning, able to perform tiptoe, and heel walking without difficulty.   Romberg signs: Negative  Deep tendon reflexes: Brachioradialis 2/2, biceps 2/2, triceps 2/2, patellar 2/2, Achilles 2/2, plantar responses were flexor bilaterally.   DIAGNOSTIC DATA (LABS, IMAGING, TESTING) - I reviewed patient records, labs, notes, testing and imaging myself where available.  Lab  Results  Component Value Date   WBC 7.9 03/20/2014   HGB 10.7* 03/20/2014   HCT 34.2* 03/20/2014   MCV 66.0* 03/20/2014   PLT 395 03/20/2014      Component Value Date/Time   NA 142 08/24/2014 0911   K 3.7 08/24/2014 0911   CL 103 08/24/2014 0911   CO2 30 08/24/2014 0911   GLUCOSE 89 08/24/2014 0911   BUN 10 08/24/2014 0911   CREATININE 1.15 08/24/2014 0911   CREATININE 1.11 01/25/2014 0544   CALCIUM 9.3 08/24/2014 0911   PROT 7.5 08/24/2014 0911   ALBUMIN 3.5 08/24/2014 0911   AST 17 08/24/2014 0911   ALT 9 08/24/2014 0911   ALKPHOS 87 08/24/2014 0911   BILITOT 0.5 08/24/2014 0911   GFRNONAA 72 03/20/2014 1118   GFRNONAA 68* 01/25/2014 0544   GFRAA 84 03/20/2014 1118   GFRAA 79* 01/25/2014 0544   Lab Results  Component Value Date  CHOL 138 08/24/2014   HDL 38* 08/24/2014   LDLCALC 77 08/24/2014   TRIG 113 08/24/2014   CHOLHDL 3.6 08/24/2014   Lab Results  Component Value Date   HGBA1C 5.4 01/23/2014   Lab Results  Component Value Date   VITAMINB12 313 10/09/2014   Lab Results  Component Value Date   TSH 1.707 08/24/2014      ASSESSMENT AND PLAN  Inri Sobieski is a 63 y.o. male with recurrent episode of involuntary right arm shaking, suggestive of simple partial seizure, responded to Keppra 500 mg twice a day moderately, significant decrease to recurrent episodes, last event December 09 2014, he tolerated the medication well  1. Increase Keppra to 1000 mg twice a day 2. Laboratory evaluation showed positive ANA, double-stranded DNA, I have faxed result to his primary care physician Dr. Annitta Needs, he will continue to follow up with Advani for possible connective tissue disease 3. Return to clinic in 2 months  Marcial Pacas, M.D. Ph.D.  [facility] [address], [place], [place] [address] [phone]

## 2015-02-01 ENCOUNTER — Telehealth: Payer: Self-pay | Admitting: Endocrinology

## 2015-02-01 DIAGNOSIS — E785 Hyperlipidemia, unspecified: Secondary | ICD-10-CM | POA: Diagnosis not present

## 2015-02-01 DIAGNOSIS — I10 Essential (primary) hypertension: Secondary | ICD-10-CM | POA: Diagnosis not present

## 2015-02-01 DIAGNOSIS — I480 Paroxysmal atrial fibrillation: Secondary | ICD-10-CM | POA: Diagnosis not present

## 2015-02-01 DIAGNOSIS — J45909 Unspecified asthma, uncomplicated: Secondary | ICD-10-CM | POA: Diagnosis not present

## 2015-02-01 DIAGNOSIS — K219 Gastro-esophageal reflux disease without esophagitis: Secondary | ICD-10-CM | POA: Diagnosis not present

## 2015-02-01 DIAGNOSIS — E669 Obesity, unspecified: Secondary | ICD-10-CM | POA: Diagnosis not present

## 2015-02-01 MED ORDER — SILDENAFIL CITRATE 100 MG PO TABS: 100.0000 mg | ORAL_TABLET | Freq: Every day | ORAL | Status: DC | PRN

## 2015-02-01 MED ORDER — CLOMIPHENE CITRATE 50 MG PO TABS: ORAL_TABLET | ORAL | Status: DC

## 2015-02-01 NOTE — Telephone Encounter (Signed)
Rx sent to pharmacy   

## 2015-02-01 NOTE — Telephone Encounter (Signed)
Please refill x 1 Ov is due  

## 2015-02-01 NOTE — Telephone Encounter (Signed)
See below and please advise, Thanks!  

## 2015-02-01 NOTE — Telephone Encounter (Signed)
I see the pt has not been seen in a while.  He needs refills on viagara and clomiphene citrate? appt is being made

## 2015-02-02 ENCOUNTER — Encounter: Payer: Self-pay | Admitting: Endocrinology

## 2015-02-02 ENCOUNTER — Ambulatory Visit (INDEPENDENT_AMBULATORY_CARE_PROVIDER_SITE_OTHER): Payer: Medicare HMO | Admitting: Endocrinology

## 2015-02-02 ENCOUNTER — Encounter (INDEPENDENT_AMBULATORY_CARE_PROVIDER_SITE_OTHER): Payer: Self-pay

## 2015-02-02 VITALS — BP 138/86 | HR 73 | Temp 98.2°F | Ht 67.0 in | Wt 251.0 lb

## 2015-02-02 DIAGNOSIS — E291 Testicular hypofunction: Secondary | ICD-10-CM | POA: Diagnosis not present

## 2015-02-02 MED ORDER — CLOMIPHENE CITRATE 50 MG PO TABS: ORAL_TABLET | ORAL | Status: DC

## 2015-02-02 MED ORDER — SILDENAFIL CITRATE 20 MG PO TABS: ORAL_TABLET | ORAL | Status: DC

## 2015-02-02 NOTE — Patient Instructions (Signed)
i have sent a prescription to [facility], for the clomiphene, where you can but it without your insurance. Here are a few printed prescriptions, for a generic strength of viagra.  This way, you can shop around. Please come back for a follow-up appointment in 1 month.  Weight loss helps the testosterone also.

## 2015-02-02 NOTE — Progress Notes (Signed)
Subjective:    Patient ID: [name] [name], male    DOB: 09-16-1951, 64 y.o.   MRN: [id number]  HPI Pt returns for f/u of idiopathic central hypogonadism (he has 1 biological child; he has never taken illicit androgens; he denies any h/o infertility; he was seen at [facility], where he was dx'ed with hypogonadotropic hypogonadism; however, pt says he wishes to f/u here in [place] instead; he was rx'ed with clomid starting in early 2015).  He has not been able to fill either the clomid or viagra, due to cost.  pt states he feels well in general. Past Medical History  Diagnosis Date  . Hypertension   . Asthma   . Atrial fibrillation   . CHF (congestive heart failure)   . Anemia   . Heart disease     Past Surgical History  Procedure Laterality Date  . Esophagus surgery      History   Social History  . Marital Status: Single    Spouse Name: N/A  . Number of Children: 1  . Years of Education: college   Occupational History  . [employer]    Social History Main Topics  . Smoking status: Never Smoker   . Smokeless tobacco: Never Used  . Alcohol Use: No  . Drug Use: No  . Sexual Activity: Not Currently   Other Topics Concern  . Not on file   Social History Narrative   Patient lives at home alone and he is single.   Patient is semi retired.    Education college.   Right handed.   Caffeine two cokes daily.    Current Outpatient Prescriptions on File Prior to Visit  Medication Sig Dispense Refill  . albuterol (PROVENTIL HFA;VENTOLIN HFA) 108 (90 BASE) MCG/ACT inhaler Inhale 2 puffs into the lungs every 6 (six) hours as needed. 1 Inhaler 7  . amiodarone (PACERONE) 200 MG tablet Take 1 tablet (200 mg total) by mouth daily. 60 tablet 2  . aspirin EC 81 MG tablet Take 81 mg by mouth daily.      . ferrous sulfate 325 (65 FE) MG tablet Take 325 mg by mouth daily with breakfast.    . fluticasone-salmeterol (ADVAIR HFA) 115-21 MCG/ACT inhaler Inhale 2 puffs into the lungs daily as  needed. For shortness of breath 1 Inhaler 1  . ketoconazole (NIZORAL) 2 % cream Apply 1 application topically every other day. Apply to upper thigh area every other day    . levETIRAcetam (KEPPRA) 1000 MG tablet Take 1 tablet (1,000 mg total) by mouth 2 (two) times daily. 60 tablet 11  . lisinopril (PRINIVIL,ZESTRIL) 10 MG tablet Take 1 tablet (10 mg total) by mouth daily. 60 tablet 2  . lisinopril (PRINIVIL,ZESTRIL) 20 MG tablet 20 mg daily.    . metoCLOPramide (REGLAN) 5 MG tablet Take 1 tablet (5 mg total) by mouth 4 (four) times daily. 120 tablet 0  . nitroGLYCERIN (NITROSTAT) 0.4 MG SL tablet Place 1 tablet (0.4 mg total) under the tongue every 5 (five) minutes as needed for chest pain. 25 tablet 1  . omeprazole (PRILOSEC) 40 MG capsule Take 1 capsule (40 mg total) by mouth 2 (two) times daily before a meal. 60 capsule 3  . pantoprazole (PROTONIX) 40 MG tablet Take 40 mg by mouth daily.    . Vitamin D, Ergocalciferol, (DRISDOL) 50000 UNITS CAPS capsule Take 1 capsule (50,000 Units total) by mouth every 7 (seven) days. 10 capsule 2   No current facility-administered medications on file prior  to visit.    No Known Allergies  Family History  Problem Relation Age of Onset  . Cancer Mother     BP 138/86 mmHg  Pulse 73  Temp(Src) 98.2 F (36.8 C) (Oral)  Ht 5\' 7"  (1.702 m)  Wt 251 lb (113.853 kg)  BMI 39.30 kg/m2  SpO2 98%  Review of Systems Denies decreased urination.    Objective:   Physical Exam VITAL SIGNS:  See vs page GENERAL: no distress Chest: bilat pseudogynecomastia is again noted.   GENITALIA: Normal male testicles, scrotum, and penis       Assessment & Plan:  Hypogonadism: i advised pt on cheaper ways to get clomid ([facility]) and sildenafil (generic 20 mg).   Patient is advised the following: Patient Instructions  i have sent a prescription to [facility], for the clomiphene, where you can but it without your insurance. Here are a few printed prescriptions, for  a generic strength of viagra.  This way, you can shop around. Please come back for a follow-up appointment in 1 month.  Weight loss helps the testosterone also.

## 2015-02-10 ENCOUNTER — Telehealth: Payer: Self-pay | Admitting: *Deleted

## 2015-02-10 ENCOUNTER — Ambulatory Visit: Payer: Commercial Managed Care - HMO | Admitting: Neurology

## 2015-02-10 NOTE — Telephone Encounter (Signed)
Cancelled the day of his appointment due to illness.

## 2015-02-11 ENCOUNTER — Encounter: Payer: Self-pay | Admitting: Neurology

## 2015-03-03 ENCOUNTER — Ambulatory Visit (INDEPENDENT_AMBULATORY_CARE_PROVIDER_SITE_OTHER): Payer: Medicare HMO | Admitting: Endocrinology

## 2015-03-03 ENCOUNTER — Encounter: Payer: Self-pay | Admitting: Endocrinology

## 2015-03-03 VITALS — BP 128/88 | HR 75 | Temp 98.4°F | Ht 67.0 in | Wt 248.5 lb

## 2015-03-03 DIAGNOSIS — Z125 Encounter for screening for malignant neoplasm of prostate: Secondary | ICD-10-CM | POA: Insufficient documentation

## 2015-03-03 DIAGNOSIS — N62 Hypertrophy of breast: Secondary | ICD-10-CM | POA: Diagnosis not present

## 2015-03-03 DIAGNOSIS — E291 Testicular hypofunction: Secondary | ICD-10-CM | POA: Diagnosis not present

## 2015-03-03 MED ORDER — SILDENAFIL CITRATE 20 MG PO TABS: ORAL_TABLET | ORAL | Status: DC

## 2015-03-03 NOTE — Progress Notes (Signed)
Pre visit review using our clinic review tool, if applicable. No additional management support is needed unless otherwise documented below in the visit note. 

## 2015-03-03 NOTE — Patient Instructions (Addendum)
normalization of testosterone is not known to harm you.  however, there are "theoretical" risks, including increased fertility, hair loss, prostate cancer, benign prostate enlargement, blood clots, liver problems, lower hdl ("good cholesterol"), polycythemia (opposite of anemia), sleep apnea, and behavior changes.  blood tests are being requested for you today.  We'll let you know about the results.   Please come back for a follow-up appointment in 4 months.   Weight loss helps the testosterone also.   Here is a prescription for the viagra.   If the testosterone is normal, i can prescribe for you another pill, to lessen the breast growth. Also, sometimes surgery can be done to reduce the breast size.  Let me know if you want a referral.

## 2015-03-03 NOTE — Progress Notes (Signed)
Subjective:    Patient ID: [name] [name], male    DOB: January 29, 1951, 64 y.o.   MRN: [id number]  HPI Pt returns for f/u of idiopathic central hypogonadism (he has 1 biological child; he has never taken illicit androgens; he denies any h/o infertility; he was seen at [facility], where he was dx'ed with hypogonadotropic hypogonadism; however, pt says he wishes to f/u here in [place] instead; he was rx'ed with clomid starting in early 2015).  Pt says he feels better overall since he has been on the clomid, but ED sxs persist.  He hasn't been able to get viagra filled.  Past Medical History  Diagnosis Date  . Hypertension   . Asthma   . Atrial fibrillation   . CHF (congestive heart failure)   . Anemia   . Heart disease     Past Surgical History  Procedure Laterality Date  . Esophagus surgery      History   Social History  . Marital Status: Single    Spouse Name: N/A  . Number of Children: 1  . Years of Education: college   Occupational History  . [employer]    Social History Main Topics  . Smoking status: Never Smoker   . Smokeless tobacco: Never Used  . Alcohol Use: No  . Drug Use: No  . Sexual Activity: Not Currently   Other Topics Concern  . Not on file   Social History Narrative   Patient lives at home alone and he is single.   Patient is semi retired.    Education college.   Right handed.   Caffeine two cokes daily.    Current Outpatient Prescriptions on File Prior to Visit  Medication Sig Dispense Refill  . albuterol (PROVENTIL HFA;VENTOLIN HFA) 108 (90 BASE) MCG/ACT inhaler Inhale 2 puffs into the lungs every 6 (six) hours as needed. 1 Inhaler 7  . amiodarone (PACERONE) 200 MG tablet Take 1 tablet (200 mg total) by mouth daily. 60 tablet 2  . aspirin EC 81 MG tablet Take 81 mg by mouth daily.      . ferrous sulfate 325 (65 FE) MG tablet Take 325 mg by mouth daily with breakfast.    . fluticasone-salmeterol (ADVAIR HFA) 115-21 MCG/ACT inhaler Inhale 2 puffs  into the lungs daily as needed. For shortness of breath 1 Inhaler 1  . ketoconazole (NIZORAL) 2 % cream Apply 1 application topically every other day. Apply to upper thigh area every other day    . levETIRAcetam (KEPPRA) 1000 MG tablet Take 1 tablet (1,000 mg total) by mouth 2 (two) times daily. 60 tablet 11  . lisinopril (PRINIVIL,ZESTRIL) 10 MG tablet Take 1 tablet (10 mg total) by mouth daily. 60 tablet 2  . lisinopril (PRINIVIL,ZESTRIL) 20 MG tablet 20 mg daily.    . metoCLOPramide (REGLAN) 5 MG tablet Take 1 tablet (5 mg total) by mouth 4 (four) times daily. 120 tablet 0  . nitroGLYCERIN (NITROSTAT) 0.4 MG SL tablet Place 1 tablet (0.4 mg total) under the tongue every 5 (five) minutes as needed for chest pain. 25 tablet 1  . omeprazole (PRILOSEC) 40 MG capsule Take 1 capsule (40 mg total) by mouth 2 (two) times daily before a meal. 60 capsule 3  . pantoprazole (PROTONIX) 40 MG tablet Take 40 mg by mouth daily.    . Vitamin D, Ergocalciferol, (DRISDOL) 50000 UNITS CAPS capsule Take 1 capsule (50,000 Units total) by mouth every 7 (seven) days. 10 capsule 2   No current facility-administered  medications on file prior to visit.    No Known Allergies  Family History  Problem Relation Age of Onset  . Cancer Mother     BP 128/88 mmHg  Pulse 75  Temp(Src) 98.4 F (36.9 C) (Oral)  Ht 5\' 7"  (1.702 m)  Wt 248 lb 8 oz (112.719 kg)  BMI 38.91 kg/m2  SpO2 97%   Review of Systems Denies decreased urinary stream.  Gynecomastia persists.      Objective:   Physical Exam VITAL SIGNS:  See vs page GENERAL: no distress Breasts: mod bilat gynecomastia Ext: no edema.   Lab Results  Component Value Date   TESTOSTERONE 135.80* 03/03/2015       Assessment & Plan:  Hypogonadism, worse despite clomid. Gynecomastia: persistent.  prob due to hypogonadism. ED: persistent  Patient is advised the following: Patient Instructions  normalization of testosterone is not known to harm you.   however, there are "theoretical" risks, including increased fertility, hair loss, prostate cancer, benign prostate enlargement, blood clots, liver problems, lower hdl ("good cholesterol"), polycythemia (opposite of anemia), sleep apnea, and behavior changes.  blood tests are being requested for you today.  We'll let you know about the results.   Please come back for a follow-up appointment in 4 months.   Weight loss helps the testosterone also.   Here is a prescription for the viagra.   If the testosterone is normal, i can prescribe for you another pill, to lessen the breast growth. Also, sometimes surgery can be done to reduce the breast size.  Let me know if you want a referral.

## 2015-03-04 LAB — CBC WITH DIFFERENTIAL/PLATELET
Basophils Absolute: 0 10*3/uL (ref 0.0–0.1)
Basophils Relative: 0.5 % (ref 0.0–3.0)
EOS PCT: 5.8 % — AB (ref 0.0–5.0)
Eosinophils Absolute: 0.3 10*3/uL (ref 0.0–0.7)
HCT: 35.3 % — ABNORMAL LOW (ref 39.0–52.0)
HEMOGLOBIN: 11.1 g/dL — AB (ref 13.0–17.0)
Lymphocytes Relative: 47.6 % — ABNORMAL HIGH (ref 12.0–46.0)
Lymphs Abs: 2.4 10*3/uL (ref 0.7–4.0)
MCHC: 31.3 g/dL (ref 30.0–36.0)
MCV: 67.2 fl — ABNORMAL LOW (ref 78.0–100.0)
Monocytes Absolute: 0.9 10*3/uL (ref 0.1–1.0)
Monocytes Relative: 18.2 % — ABNORMAL HIGH (ref 3.0–12.0)
NEUTROS ABS: 1.4 10*3/uL (ref 1.4–7.7)
Neutrophils Relative %: 27.9 % — ABNORMAL LOW (ref 43.0–77.0)
Platelets: 356 10*3/uL (ref 150.0–400.0)
RBC: 5.26 Mil/uL (ref 4.22–5.81)
RDW: 16.3 % — ABNORMAL HIGH (ref 11.5–15.5)
WBC: 5.1 10*3/uL (ref 4.0–10.5)

## 2015-03-04 LAB — TESTOSTERONE: Testosterone: 135.8 ng/dL — ABNORMAL LOW (ref 300.00–890.00)

## 2015-03-04 LAB — PSA: PSA: 0.45 ng/mL (ref 0.10–4.00)

## 2015-03-04 MED ORDER — CLOMIPHENE CITRATE 50 MG PO TABS: ORAL_TABLET | ORAL | Status: DC

## 2015-03-05 LAB — PROLACTIN: Prolactin: 11.2 ng/mL (ref 2.1–17.1)

## 2015-03-10 LAB — ESTRADIOL, FREE
Estradiol, Free: 0.52 pg/mL — ABNORMAL HIGH (ref <=0.45)
Estradiol: 31 pg/mL — ABNORMAL HIGH (ref <=29)

## 2015-03-12 ENCOUNTER — Encounter: Payer: Self-pay | Admitting: Internal Medicine

## 2015-03-12 ENCOUNTER — Ambulatory Visit: Payer: Commercial Managed Care - HMO | Attending: Internal Medicine | Admitting: Internal Medicine

## 2015-03-12 VITALS — BP 129/82 | HR 78 | Temp 98.0°F | Resp 16 | Wt 250.0 lb

## 2015-03-12 DIAGNOSIS — Z8709 Personal history of other diseases of the respiratory system: Secondary | ICD-10-CM | POA: Diagnosis not present

## 2015-03-12 DIAGNOSIS — Z7982 Long term (current) use of aspirin: Secondary | ICD-10-CM | POA: Diagnosis not present

## 2015-03-12 DIAGNOSIS — I4891 Unspecified atrial fibrillation: Secondary | ICD-10-CM | POA: Diagnosis not present

## 2015-03-12 DIAGNOSIS — R21 Rash and other nonspecific skin eruption: Secondary | ICD-10-CM | POA: Insufficient documentation

## 2015-03-12 DIAGNOSIS — I509 Heart failure, unspecified: Secondary | ICD-10-CM | POA: Insufficient documentation

## 2015-03-12 DIAGNOSIS — I10 Essential (primary) hypertension: Secondary | ICD-10-CM | POA: Insufficient documentation

## 2015-03-12 DIAGNOSIS — E291 Testicular hypofunction: Secondary | ICD-10-CM | POA: Insufficient documentation

## 2015-03-12 DIAGNOSIS — J45909 Unspecified asthma, uncomplicated: Secondary | ICD-10-CM | POA: Diagnosis not present

## 2015-03-12 DIAGNOSIS — K219 Gastro-esophageal reflux disease without esophagitis: Secondary | ICD-10-CM | POA: Diagnosis not present

## 2015-03-12 DIAGNOSIS — Z79899 Other long term (current) drug therapy: Secondary | ICD-10-CM | POA: Diagnosis not present

## 2015-03-12 LAB — COMPLETE METABOLIC PANEL WITH GFR
ALT: 9 U/L (ref 0–53)
AST: 16 U/L (ref 0–37)
Albumin: 3.3 g/dL — ABNORMAL LOW (ref 3.5–5.2)
Alkaline Phosphatase: 97 U/L (ref 39–117)
BUN: 8 mg/dL (ref 6–23)
CALCIUM: 8.9 mg/dL (ref 8.4–10.5)
CHLORIDE: 100 meq/L (ref 96–112)
CO2: 29 meq/L (ref 19–32)
Creat: 1.06 mg/dL (ref 0.50–1.35)
GFR, EST AFRICAN AMERICAN: 84 mL/min
GFR, Est Non African American: 73 mL/min
Glucose, Bld: 87 mg/dL (ref 70–99)
Potassium: 4 mEq/L (ref 3.5–5.3)
Sodium: 139 mEq/L (ref 135–145)
TOTAL PROTEIN: 7.8 g/dL (ref 6.0–8.3)
Total Bilirubin: 0.4 mg/dL (ref 0.2–1.2)

## 2015-03-12 MED ORDER — ALBUTEROL SULFATE HFA 108 (90 BASE) MCG/ACT IN AERS: 2.0000 | INHALATION_SPRAY | Freq: Four times a day (QID) | RESPIRATORY_TRACT | Status: DC | PRN

## 2015-03-12 MED ORDER — FLUTICASONE-SALMETEROL 115-21 MCG/ACT IN AERO: 2.0000 | INHALATION_SPRAY | Freq: Every day | RESPIRATORY_TRACT | Status: DC | PRN

## 2015-03-12 MED ORDER — CLOTRIMAZOLE-BETAMETHASONE 1-0.05 % EX CREA: 1.0000 "application " | TOPICAL_CREAM | Freq: Two times a day (BID) | CUTANEOUS | Status: DC

## 2015-03-12 MED ORDER — LISINOPRIL 10 MG PO TABS: 10.0000 mg | ORAL_TABLET | Freq: Every day | ORAL | Status: DC

## 2015-03-12 NOTE — Patient Instructions (Signed)
DASH Eating Plan °DASH stands for "Dietary Approaches to Stop Hypertension." The DASH eating plan is a healthy eating plan that has been shown to reduce high blood pressure (hypertension). Additional health benefits may include reducing the risk of type 2 diabetes mellitus, heart disease, and stroke. The DASH eating plan may also help with weight loss. °WHAT DO I NEED TO KNOW ABOUT THE DASH EATING PLAN? °For the DASH eating plan, you will follow these general guidelines: °· Choose foods with a percent daily value for sodium of less than 5% (as listed on the food label). °· Use salt-free seasonings or herbs instead of table salt or sea salt. °· Check with your health care provider or pharmacist before using salt substitutes. °· Eat lower-sodium products, often labeled as "lower sodium" or "no salt added." °· Eat fresh foods. °· Eat more vegetables, fruits, and low-fat dairy products. °· Choose whole grains. Look for the word "whole" as the first word in the ingredient list. °· Choose fish and skinless chicken or turkey more often than red meat. Limit fish, poultry, and meat to 6 oz (170 g) each day. °· Limit sweets, desserts, sugars, and sugary drinks. °· Choose heart-healthy fats. °· Limit cheese to 1 oz (28 g) per day. °· Eat more home-cooked food and less restaurant, buffet, and fast food. °· Limit fried foods. °· Cook foods using methods other than frying. °· Limit canned vegetables. If you do use them, rinse them well to decrease the sodium. °· When eating at a restaurant, ask that your food be prepared with less salt, or no salt if possible. °WHAT FOODS CAN I EAT? °Seek help from a dietitian for individual calorie needs. °Grains °Whole grain or whole wheat bread. Brown rice. Whole grain or whole wheat pasta. Quinoa, bulgur, and whole grain cereals. Low-sodium cereals. Corn or whole wheat flour tortillas. Whole grain cornbread. Whole grain crackers. Low-sodium crackers. °Vegetables °Fresh or frozen vegetables  (raw, steamed, roasted, or grilled). Low-sodium or reduced-sodium tomato and vegetable juices. Low-sodium or reduced-sodium tomato sauce and paste. Low-sodium or reduced-sodium canned vegetables.  °Fruits °All fresh, canned (in natural juice), or frozen fruits. °Meat and Other Protein Products °Ground beef (85% or leaner), grass-fed beef, or beef trimmed of fat. Skinless chicken or turkey. Ground chicken or turkey. Pork trimmed of fat. All fish and seafood. Eggs. Dried beans, peas, or lentils. Unsalted nuts and seeds. Unsalted canned beans. °Dairy °Low-fat dairy products, such as skim or 1% milk, 2% or reduced-fat cheeses, low-fat ricotta or cottage cheese, or plain low-fat yogurt. Low-sodium or reduced-sodium cheeses. °Fats and Oils °Tub margarines without trans fats. Light or reduced-fat mayonnaise and salad dressings (reduced sodium). Avocado. Safflower, olive, or canola oils. Natural peanut or almond butter. °Other °Unsalted popcorn and pretzels. °The items listed above may not be a complete list of recommended foods or beverages. Contact your dietitian for more options. °WHAT FOODS ARE NOT RECOMMENDED? °Grains °White bread. White pasta. White rice. Refined cornbread. Bagels and croissants. Crackers that contain trans fat. °Vegetables °Creamed or fried vegetables. Vegetables in a cheese sauce. Regular canned vegetables. Regular canned tomato sauce and paste. Regular tomato and vegetable juices. °Fruits °Dried fruits. Canned fruit in light or heavy syrup. Fruit juice. °Meat and Other Protein Products °Fatty cuts of meat. Ribs, chicken wings, bacon, sausage, bologna, salami, chitterlings, fatback, hot dogs, bratwurst, and packaged luncheon meats. Salted nuts and seeds. Canned beans with salt. °Dairy °Whole or 2% milk, cream, half-and-half, and cream cheese. Whole-fat or sweetened yogurt. Full-fat   cheeses or blue cheese. Nondairy creamers and whipped toppings. Processed cheese, cheese spreads, or cheese  curds. °Condiments °Onion and garlic salt, seasoned salt, table salt, and sea salt. Canned and packaged gravies. Worcestershire sauce. Tartar sauce. Barbecue sauce. Teriyaki sauce. Soy sauce, including reduced sodium. Steak sauce. Fish sauce. Oyster sauce. Cocktail sauce. Horseradish. Ketchup and mustard. Meat flavorings and tenderizers. Bouillon cubes. Hot sauce. Tabasco sauce. Marinades. Taco seasonings. Relishes. °Fats and Oils °Butter, stick margarine, lard, shortening, ghee, and bacon fat. Coconut, palm kernel, or palm oils. Regular salad dressings. °Other °Pickles and olives. Salted popcorn and pretzels. °The items listed above may not be a complete list of foods and beverages to avoid. Contact your dietitian for more information. °WHERE CAN I FIND MORE INFORMATION? °National [employer], [employer], [employer]: www.nhlbi.nih.gov/health/health-topics/topics/dash/ °Document Released: 11/30/2011 Document Revised: 04/27/2014 Document Reviewed: 10/15/2013 °Exit[employer]® Patient Information ©2015 [employer]. This information is not intended to replace advice given to you by your health care provider. Make sure you discuss any questions you have with your health care provider. ° °

## 2015-03-12 NOTE — Progress Notes (Signed)
MRN: [id number] Name: Billie Intriago  Sex: male Age: 64 y.o. DOB: 02/05/1951  Allergies: Review of patient's allergies indicates no known allergies.  Chief Complaint  Patient presents with  . Follow-up    HPI: Patient is 64 y.o. male who has history of asthma, GERD, hypertension, hypogonadism, comes today for followup requesting refill on his medications, also he has chronic rash in his groin as per patient he was  Seen by dermatologist in the past, recently have been having lot of itching denies any urinary symptoms, he is been trying over-the-counter medication without much improvement.patient is also following up with endocrinologist for hypogonadism.  Past Medical History  Diagnosis Date  . Hypertension   . Asthma   . Atrial fibrillation   . CHF (congestive heart failure)   . Anemia   . Heart disease     Past Surgical History  Procedure Laterality Date  . Esophagus surgery        Medication List       This list is accurate as of: 03/12/15 11:15 AM.  Always use your most recent med list.               albuterol 108 (90 BASE) MCG/ACT inhaler  Commonly known as:  PROVENTIL HFA;VENTOLIN HFA  Inhale 2 puffs into the lungs every 6 (six) hours as needed.     amiodarone 200 MG tablet  Commonly known as:  PACERONE  Take 1 tablet (200 mg total) by mouth daily.     aspirin EC 81 MG tablet  Take 81 mg by mouth daily.     clomiPHENE 50 MG tablet  Commonly known as:  CLOMID  1/2 tab daily.     clotrimazole-betamethasone cream  Commonly known as:  LOTRISONE  Apply 1 application topically 2 (two) times daily.     ferrous sulfate 325 (65 FE) MG tablet  Take 325 mg by mouth daily with breakfast.     fluticasone-salmeterol 115-21 MCG/ACT inhaler  Commonly known as:  ADVAIR HFA  Inhale 2 puffs into the lungs daily as needed. For shortness of breath     ketoconazole 2 % cream  Commonly known as:  NIZORAL  Apply 1 application topically every other day. Apply to upper  thigh area every other day     levETIRAcetam 1000 MG tablet  Commonly known as:  KEPPRA  Take 1 tablet (1,000 mg total) by mouth 2 (two) times daily.     lisinopril 20 MG tablet  Commonly known as:  PRINIVIL,ZESTRIL  20 mg daily.     lisinopril 10 MG tablet  Commonly known as:  PRINIVIL,ZESTRIL  Take 1 tablet (10 mg total) by mouth daily.     metoCLOPramide 5 MG tablet  Commonly known as:  REGLAN  Take 1 tablet (5 mg total) by mouth 4 (four) times daily.     nitroGLYCERIN 0.4 MG SL tablet  Commonly known as:  NITROSTAT  Place 1 tablet (0.4 mg total) under the tongue every 5 (five) minutes as needed for chest pain.     omeprazole 40 MG capsule  Commonly known as:  PRILOSEC  Take 1 capsule (40 mg total) by mouth 2 (two) times daily before a meal.     pantoprazole 40 MG tablet  Commonly known as:  PROTONIX  Take 40 mg by mouth daily.     sildenafil 20 MG tablet  Commonly known as:  REVATIO  2-5 pills as needed for ED symptoms     Vitamin D (  Ergocalciferol) 50000 UNITS Caps capsule  Commonly known as:  DRISDOL  Take 1 capsule (50,000 Units total) by mouth every 7 (seven) days.        Meds ordered this encounter  Medications  . clotrimazole-betamethasone (LOTRISONE) cream    Sig: Apply 1 application topically 2 (two) times daily.    Dispense:  30 g    Refill:  1  . albuterol (PROVENTIL HFA;VENTOLIN HFA) 108 (90 BASE) MCG/ACT inhaler    Sig: Inhale 2 puffs into the lungs every 6 (six) hours as needed.    Dispense:  1 Inhaler    Refill:  5  . lisinopril (PRINIVIL,ZESTRIL) 10 MG tablet    Sig: Take 1 tablet (10 mg total) by mouth daily.    Dispense:  60 tablet    Refill:  2  . fluticasone-salmeterol (ADVAIR HFA) 115-21 MCG/ACT inhaler    Sig: Inhale 2 puffs into the lungs daily as needed. For shortness of breath    Dispense:  1 Inhaler    Refill:  3    There is no immunization history for the selected administration types on file for this patient.  Family  History  Problem Relation Age of Onset  . Cancer Mother     History  Substance Use Topics  . Smoking status: Never Smoker   . Smokeless tobacco: Never Used  . Alcohol Use: No    Review of Systems   As noted in HPI  Filed Vitals:   03/12/15 0957  BP: 129/82  Pulse: 78  Temp: 98 F (36.7 C)  Resp: 16    Physical Exam  Physical Exam  Constitutional: No distress.  Eyes: EOM are normal. Pupils are equal, round, and reactive to light.  Cardiovascular: Normal rate and regular rhythm.   Pulmonary/Chest: Breath sounds normal. No respiratory distress. He has no wheezes. He has no rales.  Abdominal: Soft. There is no tenderness.  Rash in the groin     CBC    Component Value Date/Time   WBC 5.1 03/03/2015 1630   RBC 5.26 03/03/2015 1630   RBC 4.97 08/03/2010 1557   HGB 11.1* 03/03/2015 1630   HCT 35.3* 03/03/2015 1630   PLT 356.0 03/03/2015 1630   MCV 67.2 Repeated and verified X2.* 03/03/2015 1630   LYMPHSABS 2.4 03/03/2015 1630   MONOABS 0.9 03/03/2015 1630   EOSABS 0.3 03/03/2015 1630   BASOSABS 0.0 03/03/2015 1630    CMP     Component Value Date/Time   NA 142 08/24/2014 0911   K 3.7 08/24/2014 0911   CL 103 08/24/2014 0911   CO2 30 08/24/2014 0911   GLUCOSE 89 08/24/2014 0911   BUN 10 08/24/2014 0911   CREATININE 1.15 08/24/2014 0911   CREATININE 1.11 01/25/2014 0544   CALCIUM 9.3 08/24/2014 0911   PROT 7.5 08/24/2014 0911   ALBUMIN 3.5 08/24/2014 0911   AST 17 08/24/2014 0911   ALT 9 08/24/2014 0911   ALKPHOS 87 08/24/2014 0911   BILITOT 0.5 08/24/2014 0911   GFRNONAA 72 03/20/2014 1118   GFRNONAA 68* 01/25/2014 0544   GFRAA 84 03/20/2014 1118   GFRAA 79* 01/25/2014 0544    Lab Results  Component Value Date/Time   CHOL 138 08/24/2014 09:11 AM    No components found for: HGA1C  Lab Results  Component Value Date/Time   AST 17 08/24/2014 09:11 AM    Assessment and Plan  History of asthma - Plan: symptoms are stable continue with  albuterol (PROVENTIL HFA;VENTOLIN HFA) 108 (90  BASE) MCG/ACT inhaler, fluticasone-salmeterol (ADVAIR HFA) 115-21 MCG/ACT inhaler  Essential hypertension - Plan:advised patient for DASH diet continue with  lisinopril (PRINIVIL,ZESTRIL) 10 MG tablet, COMPLETE METABOLIC PANEL WITH GFR  Groin rash - Plan: Ambulatory referral to Dermatology, clotrimazole-betamethasone (LOTRISONE) cream   Health Maintenance  -Vaccinations:  Patient declined flu shot, he will think about Pneumovax   Return in about 3 months (around 06/12/2015) for hypertension, asthma.   This note has been created with Surveyor, quantity. Any transcriptional errors are unintentional.    Lorayne Marek, MD

## 2015-03-12 NOTE — Progress Notes (Signed)
Patient here for follow up on his asthma Patient has rash to his upper thighs and requesting a referral To dermatologist

## 2015-04-01 ENCOUNTER — Encounter: Payer: Self-pay | Admitting: Neurology

## 2015-04-01 ENCOUNTER — Ambulatory Visit (INDEPENDENT_AMBULATORY_CARE_PROVIDER_SITE_OTHER): Payer: Commercial Managed Care - HMO | Admitting: Neurology

## 2015-04-01 VITALS — BP 147/84 | HR 65 | Ht 67.0 in | Wt 244.0 lb

## 2015-04-01 DIAGNOSIS — I482 Chronic atrial fibrillation, unspecified: Secondary | ICD-10-CM

## 2015-04-01 DIAGNOSIS — G4730 Sleep apnea, unspecified: Secondary | ICD-10-CM

## 2015-04-01 DIAGNOSIS — R569 Unspecified convulsions: Secondary | ICD-10-CM | POA: Diagnosis not present

## 2015-04-01 DIAGNOSIS — I10 Essential (primary) hypertension: Secondary | ICD-10-CM | POA: Diagnosis not present

## 2015-04-01 MED ORDER — LEVETIRACETAM 750 MG PO TABS: 1500.0000 mg | ORAL_TABLET | Freq: Two times a day (BID) | ORAL | Status: DC

## 2015-04-01 NOTE — Progress Notes (Signed)
PATIENT: [name] [name] DOB: [birth year]  HISTORICAL Patient is a 64 years old African American male, referred by his primary care physician Dr. Annitta Needs for evaluation of recurrent right arm shaking  He has past medical history of hypertension, paroxysmal A. fib asthma, GERD , hypogonadism  Since 2012 he began to have stereotypical episodes of sudden onset involuntary right arm large amplitude shaking, with no right face, leg involvement, no loss of consciousness, lasting 2-3 minutes, it happened, average couple times each week, no generalized seizure  He reported that his brother, and father had similar event, was diagnosed with epilepsy  Over the years, he has gradually developed feminine features, including gynecomastia, voice change, erectile dysfunction, is under evaluation of  his endocrinologist.  EEG was normal, MRI brain Oct 2015 there was evidence of multiple periventricular white matter disease, most likely small vessel disease  UPDATE Dec 17th 2015:  I have started Keppra 500 mg twice a day since October 2015, he tolerated the medication very well, there was significant less episodes, most recent December 09 2014, sudden onset uncontrollable large amplitude forceful right arm shaking, no loss of consciousness, lasting for 1 minutes,  Laboratory showed elevated ESR 57, positive ANA, double-stranded DNA, normal B37, folic acid, negative RPR, HIV.  UPDATE April 7th 2016: He still has recurrent episode about once a week, Last one was in April 3rd 2016, suddenly, he had right arm shaking, uncontrollable, it stopped in 2 minutes. NO LOC  He is now taking Keppra 1050m bid,  He reported, he had much less episode while taking Keppra   REVIEW OF SYSTEMS: Full 14 system review of systems performed and notable only for wheezing, shortness of breath, fatigue  Allergy No Known Allergies  [facility] MEDICATIONS: Current Outpatient Prescriptions on File Prior to Visit  Medication Sig  Dispense Refill  . albuterol (PROVENTIL HFA;VENTOLIN HFA) 108 (90 BASE) MCG/ACT inhaler Inhale 2 puffs into the lungs every 6 (six) hours as needed.  1 Inhaler  7  . amiodarone (PACERONE) 200 MG tablet Take 1 tablet (200 mg total) by mouth daily.  60 tablet  2  . aspirin EC 81 MG tablet Take 81 mg by mouth daily.        . clomiPHENE (CLOMID) 50 MG tablet 1/4 tab daily  10 tablet  2  . ferrous sulfate 325 (65 FE) MG tablet Take 325 mg by mouth daily with breakfast.      . fluticasone-salmeterol (ADVAIR HFA) 115-21 MCG/ACT inhaler Inhale 2 puffs into the lungs daily as needed. For shortness of breath  1 Inhaler  1  . ketoconazole (NIZORAL) 2 % cream Apply 1 application topically every other day. Apply to upper thigh area every other day      . lisinopril (PRINIVIL,ZESTRIL) 10 MG tablet Take 1 tablet (10 mg total) by mouth daily.  60 tablet  2  . metoCLOPramide (REGLAN) 5 MG tablet Take 1 tablet (5 mg total) by mouth 4 (four) times daily.  120 tablet  0  . nitroGLYCERIN (NITROSTAT) 0.4 MG SL tablet Place 1 tablet (0.4 mg total) under the tongue every 5 (five) minutes as needed for chest pain.  25 tablet  1  . omeprazole (PRILOSEC) 40 MG capsule Take 1 capsule (40 mg total) by mouth 2 (two) times daily before a meal.  60 capsule  3  . sildenafil (VIAGRA) 100 MG tablet Take 1 tablet (100 mg total) by mouth daily as needed for erectile dysfunction.  10 tablet  10  .  Vitamin D, Ergocalciferol, (DRISDOL) 50000 UNITS CAPS capsule Take 1 capsule (50,000 Units total) by mouth every 7 (seven) days.  10 capsule  2     PAST MEDICAL HISTORY: Past Medical History  Diagnosis Date  . Hypertension   . Asthma   . Atrial fibrillation   . CHF (congestive heart failure)   . Anemia   . Heart disease     PAST SURGICAL HISTORY: Past Surgical History  Procedure Laterality Date  . Esophagus surgery      FAMILY HISTORY: Family History  Problem Relation Age of Onset  . Cancer Mother     SOCIAL  HISTORY:  History   Social History  . Marital Status: Single    Spouse Name: N/A  . Number of Children: 1  . Years of Education: college   Occupational History  . [employer]    Social History Main Topics  . Smoking status: Never Smoker   . Smokeless tobacco: Never Used  . Alcohol Use: No  . Drug Use: No  . Sexual Activity: Not Currently   Other Topics Concern  . Not on file   Social History Narrative   Patient lives at home alone and he is single.   Patient is semi retired.    Education college.   Right handed.   Caffeine two cokes daily.   PHYSICAL EXAM   Filed Vitals:   04/01/15 1016  BP: 147/84  Pulse: 65  Height: '5\' 7"'  (1.702 m)  Weight: 244 lb (110.678 kg)    Not recorded      Body mass index is 38.21 kg/(m^2).  PHYSICAL EXAMNIATION:  Gen: NAD, conversant, well nourised, obese, well groomed                     Cardiovascular: Regular rate rhythm, no peripheral edema, warm, nontender. Eyes: Conjunctivae clear without exudates or hemorrhage Neck: Supple, no carotid bruise. Pulmonary: Clear to auscultation bilateral,   NEUROLOGICAL EXAM:  MENTAL STATUS: Speech:    Speech is normal; fluent and spontaneous with normal comprehension.  Cognition:    The patient is oriented to person, place, and time;     recent and remote memory intact;     language fluent;     normal attention, concentration,     fund of knowledge.  CRANIAL NERVES: CN II: Visual fields are full to confrontation. Fundoscopic exam is normal with sharp discs and no vascular changes. Venous pulsations are present bilaterally. Pupils are 4 mm and briskly reactive to light. Visual acuity is 20/20 bilaterally. CN III, IV, VI: extraocular movement are normal. No ptosis. CN V: Facial sensation is intact to pinprick in all 3 divisions bilaterally. Corneal responses are intact.  CN VII: Face is symmetric with normal eye closure and smile. CN VIII: Hearing is normal to rubbing fingers CN IX,  X: Palate elevates symmetrically. Phonation is normal. CN XI: Head turning and shoulder shrug are intact CN XII: Tongue is midline with normal movements and no atrophy.  MOTOR: There is no pronator drift of out-stretched arms. Muscle bulk and tone are normal. Muscle strength is normal.   Shoulder abduction Shoulder external rotation Elbow flexion Elbow extension Wrist flexion Wrist extension Finger abduction Hip flexion Knee flexion Knee extension Ankle dorsi flexion Ankle plantar flexion  R '5 5 5 5 5 5 5 5 5 5 5 5  ' L '5 5 5 5 5 5 5 5 5 5 5 5    ' REFLEXES: Reflexes are 2+ and  symmetric at the biceps, triceps, knees, and ankles. Plantar responses are flexor.  SENSORY: Light touch, pinprick, position sense, and vibration sense are intact in fingers and toes.  COORDINATION: Rapid alternating movements and fine finger movements are intact. There is no dysmetria on finger-to-nose and heel-knee-shin. There are no abnormal or extraneous movements.   GAIT/STANCE: Posture is normal. Gait is steady with normal steps, base, arm swing, and turning. Heel and toe walking are normal. Tandem gait is normal.  Romberg is absent.     DIAGNOSTIC DATA (LABS, IMAGING, TESTING) - I reviewed patient records, labs, notes, testing and imaging myself where available.  Lab Results  Component Value Date   WBC 5.1 03/03/2015   HGB 11.1* 03/03/2015   HCT 35.3* 03/03/2015   MCV 67.2 Repeated and verified X2.* 03/03/2015   PLT 356.0 03/03/2015      Component Value Date/Time   NA 139 03/12/2015 1015   K 4.0 03/12/2015 1015   CL 100 03/12/2015 1015   CO2 29 03/12/2015 1015   GLUCOSE 87 03/12/2015 1015   BUN 8 03/12/2015 1015   CREATININE 1.06 03/12/2015 1015   CREATININE 1.11 01/25/2014 0544   CALCIUM 8.9 03/12/2015 1015   PROT 7.8 03/12/2015 1015   ALBUMIN 3.3* 03/12/2015 1015   AST 16 03/12/2015 1015   ALT 9 03/12/2015 1015   ALKPHOS 97 03/12/2015 1015   BILITOT 0.4 03/12/2015 1015   GFRNONAA 73  03/12/2015 1015   GFRNONAA 68* 01/25/2014 0544   GFRAA 84 03/12/2015 1015   GFRAA 79* 01/25/2014 0544   Lab Results  Component Value Date   CHOL 138 08/24/2014   HDL 38* 08/24/2014   LDLCALC 77 08/24/2014   TRIG 113 08/24/2014   CHOLHDL 3.6 08/24/2014   Lab Results  Component Value Date   HGBA1C 5.4 01/23/2014   Lab Results  Component Value Date   VITAMINB12 313 10/09/2014   Lab Results  Component Value Date   TSH 1.707 08/24/2014      ASSESSMENT AND PLAN  Ashtian Villacis is a 64 y.o. male with recurrent episode of  Uncontrollable right arm shaking, suggestive of simple partial seizure, responded to Keppra , significant decrease  off recurrent episodes, last event was in March 28 2015  1. Increase Keppra to  765m  2 tablets twice a day 2.  Sleep deprived EEG 3.  Return to clinic in 3 months  YMarcial Pacas M.D. Ph.D.  [facility] [address] [place] [phone]

## 2015-04-07 DIAGNOSIS — D649 Anemia, unspecified: Secondary | ICD-10-CM | POA: Diagnosis not present

## 2015-04-07 DIAGNOSIS — R700 Elevated erythrocyte sedimentation rate: Secondary | ICD-10-CM | POA: Diagnosis not present

## 2015-04-07 DIAGNOSIS — R899 Unspecified abnormal finding in specimens from other organs, systems and tissues: Secondary | ICD-10-CM | POA: Diagnosis not present

## 2015-04-07 DIAGNOSIS — I4891 Unspecified atrial fibrillation: Secondary | ICD-10-CM | POA: Diagnosis not present

## 2015-04-09 DIAGNOSIS — R300 Dysuria: Secondary | ICD-10-CM | POA: Diagnosis not present

## 2015-04-09 DIAGNOSIS — M2550 Pain in unspecified joint: Secondary | ICD-10-CM | POA: Diagnosis not present

## 2015-04-09 DIAGNOSIS — Z79899 Other long term (current) drug therapy: Secondary | ICD-10-CM | POA: Diagnosis not present

## 2015-04-09 DIAGNOSIS — E559 Vitamin D deficiency, unspecified: Secondary | ICD-10-CM | POA: Diagnosis not present

## 2015-05-03 DIAGNOSIS — E669 Obesity, unspecified: Secondary | ICD-10-CM | POA: Diagnosis not present

## 2015-05-03 DIAGNOSIS — I10 Essential (primary) hypertension: Secondary | ICD-10-CM | POA: Diagnosis not present

## 2015-05-03 DIAGNOSIS — I480 Paroxysmal atrial fibrillation: Secondary | ICD-10-CM | POA: Diagnosis not present

## 2015-05-03 DIAGNOSIS — M1990 Unspecified osteoarthritis, unspecified site: Secondary | ICD-10-CM | POA: Diagnosis not present

## 2015-05-03 DIAGNOSIS — E785 Hyperlipidemia, unspecified: Secondary | ICD-10-CM | POA: Diagnosis not present

## 2015-05-03 DIAGNOSIS — K219 Gastro-esophageal reflux disease without esophagitis: Secondary | ICD-10-CM | POA: Diagnosis not present

## 2015-05-12 DIAGNOSIS — L304 Erythema intertrigo: Secondary | ICD-10-CM | POA: Diagnosis not present

## 2015-06-30 DIAGNOSIS — D485 Neoplasm of uncertain behavior of skin: Secondary | ICD-10-CM | POA: Diagnosis not present

## 2015-06-30 DIAGNOSIS — L97129 Non-pressure chronic ulcer of left thigh with unspecified severity: Secondary | ICD-10-CM | POA: Diagnosis not present

## 2015-06-30 DIAGNOSIS — L304 Erythema intertrigo: Secondary | ICD-10-CM | POA: Diagnosis not present

## 2015-07-02 ENCOUNTER — Ambulatory Visit: Payer: Medicare HMO | Admitting: Endocrinology

## 2015-07-02 ENCOUNTER — Telehealth: Payer: Self-pay | Admitting: Endocrinology

## 2015-07-02 NOTE — Telephone Encounter (Signed)
Pt did make an appt

## 2015-07-02 NOTE — Telephone Encounter (Signed)
Patient no showed today's appt. Please advise on how to follow up. °A. No follow up necessary. °B. Follow up urgent. Contact patient immediately. °C. Follow up necessary. Contact patient and schedule visit in ___ days. °D. Follow up advised. Contact patient and schedule visit in ____weeks. ° °

## 2015-07-02 NOTE — Telephone Encounter (Signed)
No follow up necessary.  

## 2015-07-05 ENCOUNTER — Ambulatory Visit (INDEPENDENT_AMBULATORY_CARE_PROVIDER_SITE_OTHER): Payer: Commercial Managed Care - HMO | Admitting: Endocrinology

## 2015-07-05 ENCOUNTER — Encounter: Payer: Self-pay | Admitting: Endocrinology

## 2015-07-05 VITALS — BP 138/88 | HR 78 | Temp 98.1°F | Resp 16 | Ht 67.5 in | Wt 242.0 lb

## 2015-07-05 DIAGNOSIS — E291 Testicular hypofunction: Secondary | ICD-10-CM

## 2015-07-05 NOTE — Progress Notes (Signed)
Subjective:    Patient ID: [name] [name], male    DOB: [birth year], 64 y.o.   MRN: [id number]  HPI Pt returns for f/u of idiopathic central hypogonadism (he has 1 biological child; he has never taken illicit androgens; he denies any h/o infertility; he was seen at [facility], where he was dx'ed with hypogonadotropic hypogonadism; however, pt says he wishes to f/u here in [place] instead; he was rx'ed with clomid starting in early 2015).  Pt says he feels better overall since the clomid was increased, but ED sxs persist.   Past Medical History  Diagnosis Date  . Hypertension   . Asthma   . Atrial fibrillation   . CHF (congestive heart failure)   . Anemia   . Heart disease     Past Surgical History  Procedure Laterality Date  . Esophagus surgery      History   Social History  . Marital Status: Single    Spouse Name: N/A  . Number of Children: 1  . Years of Education: college   Occupational History  . [employer]    Social History Main Topics  . Smoking status: Never Smoker   . Smokeless tobacco: Never Used  . Alcohol Use: No  . Drug Use: No  . Sexual Activity: Not Currently   Other Topics Concern  . Not on file   Social History Narrative   Patient lives at home alone and he is single.   Patient is semi retired.    Education college.   Right handed.   Caffeine two cokes daily.    Current Outpatient Prescriptions on File Prior to Visit  Medication Sig Dispense Refill  . albuterol (PROVENTIL HFA;VENTOLIN HFA) 108 (90 BASE) MCG/ACT inhaler Inhale 2 puffs into the lungs every 6 (six) hours as needed. 1 Inhaler 5  . amiodarone (PACERONE) 200 MG tablet Take 1 tablet (200 mg total) by mouth daily. 60 tablet 2  . aspirin EC 81 MG tablet Take 81 mg by mouth daily.      . clomiPHENE (CLOMID) 50 MG tablet 1/2 tab daily. 15 tablet 5  . clotrimazole-betamethasone (LOTRISONE) cream Apply 1 application topically 2 (two) times daily. 30 g 1  . ferrous sulfate 325 (65 FE) MG  tablet Take 325 mg by mouth daily with breakfast.    . fluticasone-salmeterol (ADVAIR HFA) 115-21 MCG/ACT inhaler Inhale 2 puffs into the lungs daily as needed. For shortness of breath 1 Inhaler 3  . ketoconazole (NIZORAL) 2 % cream Apply 1 application topically every other day. Apply to upper thigh area every other day    . levETIRAcetam (KEPPRA) 750 MG tablet Take 2 tablets (1,500 mg total) by mouth 2 (two) times daily. 120 tablet 11  . lisinopril (PRINIVIL,ZESTRIL) 20 MG tablet 20 mg daily.    . metoCLOPramide (REGLAN) 5 MG tablet Take 1 tablet (5 mg total) by mouth 4 (four) times daily. 120 tablet 0  . nitroGLYCERIN (NITROSTAT) 0.4 MG SL tablet Place 1 tablet (0.4 mg total) under the tongue every 5 (five) minutes as needed for chest pain. 25 tablet 1  . omeprazole (PRILOSEC) 40 MG capsule Take 1 capsule (40 mg total) by mouth 2 (two) times daily before a meal. 60 capsule 3  . pantoprazole (PROTONIX) 40 MG tablet Take 40 mg by mouth daily.    . sildenafil (REVATIO) 20 MG tablet 2-5 pills as needed for ED symptoms 50 tablet 10  . Vitamin D, Ergocalciferol, (DRISDOL) 50000 UNITS CAPS capsule Take 1  capsule (50,000 Units total) by mouth every 7 (seven) days. 10 capsule 2   No current facility-administered medications on file prior to visit.    No Known Allergies  Family History  Problem Relation Age of Onset  . Cancer Mother     BP 138/88 mmHg  Pulse 78  Temp(Src) 98.1 F (36.7 C) (Oral)  Resp 16  Ht 5' 7.5" (1.715 m)  Wt 242 lb (109.77 kg)  BMI 37.32 kg/m2  SpO2 98%  Review of Systems He denies decreased urinary stream    Objective:   Physical Exam VITAL SIGNS:  See vs page GENERAL: no distress Chest: gynecomastia persists.   Ext: no edema       Assessment & Plan:  Gynecomastia, persistent: he declines surgery Hypogonadism: he is due for recheck today.   Patient is advised the following: Patient Instructions  normalization of testosterone is not known to harm you.   however, there are "theoretical" risks, including increased fertility, hair loss, prostate cancer, benign prostate enlargement, blood clots, liver problems, lower hdl ("good cholesterol"), polycythemia (opposite of anemia), sleep apnea, and behavior changes.  blood tests are being requested for you today.  We'll let you know about the results.   Please come back for a follow-up appointment in 4 months.   Weight loss helps the testosterone also.  Please consider having weight loss surgery.  It is good for your health.  Here is some information about it.  If you decide to consider further, please call the phone number in the papers, and register for a free informational meeting If the testosterone is normal, i can prescribe for you another pill, to lessen the breast growth.

## 2015-07-05 NOTE — Patient Instructions (Addendum)
normalization of testosterone is not known to harm you.  however, there are "theoretical" risks, including increased fertility, hair loss, prostate cancer, benign prostate enlargement, blood clots, liver problems, lower hdl ("good cholesterol"), polycythemia (opposite of anemia), sleep apnea, and behavior changes.  blood tests are being requested for you today.  We'll let you know about the results.   Please come back for a follow-up appointment in 4 months.   Weight loss helps the testosterone also.  Please consider having weight loss surgery.  It is good for your health.  Here is some information about it.  If you decide to consider further, please call the phone number in the papers, and register for a free informational meeting If the testosterone is normal, i can prescribe for you another pill, to lessen the breast growth.

## 2015-07-06 ENCOUNTER — Telehealth: Payer: Self-pay | Admitting: *Deleted

## 2015-07-06 ENCOUNTER — Ambulatory Visit: Payer: Self-pay | Admitting: Neurology

## 2015-07-06 ENCOUNTER — Other Ambulatory Visit: Payer: Self-pay

## 2015-07-06 DIAGNOSIS — E559 Vitamin D deficiency, unspecified: Secondary | ICD-10-CM | POA: Diagnosis not present

## 2015-07-06 DIAGNOSIS — R6889 Other general symptoms and signs: Secondary | ICD-10-CM | POA: Diagnosis not present

## 2015-07-06 DIAGNOSIS — E291 Testicular hypofunction: Secondary | ICD-10-CM

## 2015-07-06 DIAGNOSIS — D649 Anemia, unspecified: Secondary | ICD-10-CM | POA: Diagnosis not present

## 2015-07-06 DIAGNOSIS — R700 Elevated erythrocyte sedimentation rate: Secondary | ICD-10-CM | POA: Diagnosis not present

## 2015-07-06 NOTE — Telephone Encounter (Signed)
No showed follow up appointment. 

## 2015-07-07 ENCOUNTER — Encounter: Payer: Self-pay | Admitting: Neurology

## 2015-07-09 ENCOUNTER — Other Ambulatory Visit: Payer: Self-pay

## 2015-07-09 DIAGNOSIS — E291 Testicular hypofunction: Secondary | ICD-10-CM

## 2015-07-14 ENCOUNTER — Telehealth: Payer: Self-pay

## 2015-08-03 ENCOUNTER — Ambulatory Visit: Payer: Self-pay | Admitting: Neurology

## 2015-09-02 DIAGNOSIS — Z0100 Encounter for examination of eyes and vision without abnormal findings: Secondary | ICD-10-CM | POA: Diagnosis not present

## 2015-09-09 ENCOUNTER — Telehealth: Payer: Self-pay | Admitting: Neurology

## 2015-09-09 ENCOUNTER — Encounter: Payer: Self-pay | Admitting: Neurology

## 2015-09-09 ENCOUNTER — Ambulatory Visit (INDEPENDENT_AMBULATORY_CARE_PROVIDER_SITE_OTHER): Payer: Commercial Managed Care - HMO | Admitting: Neurology

## 2015-09-09 VITALS — BP 136/81 | HR 63 | Ht 67.5 in | Wt 238.0 lb

## 2015-09-09 DIAGNOSIS — K219 Gastro-esophageal reflux disease without esophagitis: Secondary | ICD-10-CM | POA: Diagnosis not present

## 2015-09-09 DIAGNOSIS — E669 Obesity, unspecified: Secondary | ICD-10-CM | POA: Diagnosis not present

## 2015-09-09 DIAGNOSIS — M1990 Unspecified osteoarthritis, unspecified site: Secondary | ICD-10-CM | POA: Diagnosis not present

## 2015-09-09 DIAGNOSIS — I10 Essential (primary) hypertension: Secondary | ICD-10-CM | POA: Diagnosis not present

## 2015-09-09 DIAGNOSIS — E785 Hyperlipidemia, unspecified: Secondary | ICD-10-CM | POA: Diagnosis not present

## 2015-09-09 DIAGNOSIS — I480 Paroxysmal atrial fibrillation: Secondary | ICD-10-CM | POA: Diagnosis not present

## 2015-09-09 NOTE — Telephone Encounter (Signed)
Error

## 2015-09-09 NOTE — Progress Notes (Signed)
Chief Complaint  Patient presents with  . Seizure-like activity    Says he has only had one episode of uncontrolled shaking since his Keppra was increased at his office visit in 03/2015.       PATIENT: [name] [name] DOB: July 06, 1951  HISTORICAL Patient is a 64 years old male, referred by his primary care physician Dr. Annitta Needs for evaluation of recurrent right arm shaking  He has past medical history of hypertension, paroxysmal A. fib asthma, GERD , hypogonadism  Since 2012 he began to have stereotypical episodes of sudden onset involuntary right arm large amplitude shaking, with no right face, leg involvement, no loss of consciousness, lasting 2-3 minutes, it happened, average couple times each week, no generalized seizure  He reported that his brother, and father had similar event, was diagnosed with epilepsy  Over the years, he has gradually developed feminine features, including gynecomastia, voice change, erectile dysfunction, this is under evaluation of  his endocrinologist.  EEG was normal  MRI brain Oct 2015 there was evidence of multiple periventricular white matter disease, most likely small vessel disease  UPDATE Dec 17th 2015:  I have started Keppra 500 mg twice a day since October 2015, he tolerated the medication very well, there was significant less episodes, most recent December 09 2014, sudden onset uncontrollable large amplitude forceful right arm shaking, no loss of consciousness, lasting for 1 minutes,  Laboratory showed elevated ESR 57, positive ANA, double-stranded DNA, normal M33, folic acid, negative RPR, HIV.  UPDATE April 7th 2016: He still has recurrent episode about once a week, Last one was in April 3rd 2016, suddenly, he had right arm shaking, uncontrollable, it stopped in 2 minutes. NO LOC  He is now taking Keppra 1038m bid,  He reported, he had much less episode while taking Keppra  UPDATE Sep 09 2015: He is doing very well, since his Keppra dosage was  increased to 750 mg 2 tablets twice a day, he only had one recurrent right arm jerking movement, lasting for a few minutes, no loss of consciousness, but he does has mild confusion during episode, does not involving his right face or right leg.  REVIEW OF SYSTEMS: Full 14 system review of systems performed and notable only for wheezing, shortness of breath, fatigue  Allergy No Known Allergies  [facility]OM[facility] MEDICATIONS: Current Outpatient Prescriptions on File Prior to Visit  Medication Sig Dispense Refill  . albuterol (PROVENTIL HFA;VENTOLIN HFA) 108 (90 BASE) MCG/ACT inhaler Inhale 2 puffs into the lungs every 6 (six) hours as needed.  1 Inhaler  7  . amiodarone (PACERONE) 200 MG tablet Take 1 tablet (200 mg total) by mouth daily.  60 tablet  2  . aspirin EC 81 MG tablet Take 81 mg by mouth daily.        . clomiPHENE (CLOMID) 50 MG tablet 1/4 tab daily  10 tablet  2  . ferrous sulfate 325 (65 FE) MG tablet Take 325 mg by mouth daily with breakfast.      . fluticasone-salmeterol (ADVAIR HFA) 115-21 MCG/ACT inhaler Inhale 2 puffs into the lungs daily as needed. For shortness of breath  1 Inhaler  1  . ketoconazole (NIZORAL) 2 % cream Apply 1 application topically every other day. Apply to upper thigh area every other day      . lisinopril (PRINIVIL,ZESTRIL) 10 MG tablet Take 1 tablet (10 mg total) by mouth daily.  60 tablet  2  . metoCLOPramide (REGLAN) 5 MG tablet Take 1 tablet (5 mg  total) by mouth 4 (four) times daily.  120 tablet  0  . nitroGLYCERIN (NITROSTAT) 0.4 MG SL tablet Place 1 tablet (0.4 mg total) under the tongue every 5 (five) minutes as needed for chest pain.  25 tablet  1  . omeprazole (PRILOSEC) 40 MG capsule Take 1 capsule (40 mg total) by mouth 2 (two) times daily before a meal.  60 capsule  3  . sildenafil (VIAGRA) 100 MG tablet Take 1 tablet (100 mg total) by mouth daily as needed for erectile dysfunction.  10 tablet  10  . Vitamin D, Ergocalciferol, (DRISDOL) 50000 UNITS  CAPS capsule Take 1 capsule (50,000 Units total) by mouth every 7 (seven) days.  10 capsule  2     PAST MEDICAL HISTORY: Past Medical History  Diagnosis Date  . Hypertension   . Asthma   . Atrial fibrillation   . CHF (congestive heart failure)   . Anemia   . Heart disease     PAST SURGICAL HISTORY: Past Surgical History  Procedure Laterality Date  . Esophagus surgery      FAMILY HISTORY: Family History  Problem Relation Age of Onset  . Cancer Mother     SOCIAL HISTORY:  Social History   Social History  . Marital Status: Single    Spouse Name: N/A  . Number of Children: 1  . Years of Education: college   Occupational History  . [employer]    Social History Main Topics  . Smoking status: Never Smoker   . Smokeless tobacco: Never Used  . Alcohol Use: No  . Drug Use: No  . Sexual Activity: Not Currently   Other Topics Concern  . Not on file   Social History Narrative   Patient lives at home alone and he is single.   Patient is semi retired.    Education college.   Right handed.   Caffeine two cokes daily.   PHYSICAL EXAM   Filed Vitals:   09/09/15 1106  BP: 136/81  Pulse: 63  Height: 5' 7.5" (1.715 m)  Weight: 238 lb (107.956 kg)    Not recorded      Body mass index is 36.7 kg/(m^2).  PHYSICAL EXAMNIATION:  Gen: NAD, conversant, well nourised, obese, well groomed                     Cardiovascular: Regular rate rhythm, no peripheral edema, warm, nontender. Eyes: Conjunctivae clear without exudates or hemorrhage Neck: Supple, no carotid bruise. Pulmonary: Clear to auscultation bilateral,   NEUROLOGICAL EXAM:  MENTAL STATUS: Speech:    Speech is normal; fluent and spontaneous with normal comprehension.  Cognition:    The patient is oriented to person, place, and time;     recent and remote memory intact;     language fluent;     normal attention, concentration,     fund of knowledge.  CRANIAL NERVES: CN II: Visual fields are full  to confrontation. Fundoscopic exam is normal with sharp discs and no vascular changes. Venous pulsations are present bilaterally. Pupils are 4 mm and briskly reactive to light. Visual acuity is 20/20 bilaterally. CN III, IV, VI: extraocular movement are normal. No ptosis. CN V: Facial sensation is intact to pinprick in all 3 divisions bilaterally. Corneal responses are intact.  CN VII: Face is symmetric with normal eye closure and smile. CN VIII: Hearing is normal to rubbing fingers CN IX, X: Palate elevates symmetrically. Phonation is normal. CN XI: Head turning and shoulder  shrug are intact CN XII: Tongue is midline with normal movements and no atrophy.  MOTOR: There is no pronator drift of out-stretched arms. Muscle bulk and tone are normal. Muscle strength is normal.   REFLEXES: Reflexes are 2+ and symmetric at the biceps, triceps, knees, and ankles. Plantar responses are flexor.  SENSORY: Light touch, pinprick, position sense, and vibration sense are intact in fingers and toes.  COORDINATION: Rapid alternating movements and fine finger movements are intact. There is no dysmetria on finger-to-nose and heel-knee-shin.   GAIT/STANCE: Posture is normal. Gait is steady with normal steps, base, arm swing, and turning. Heel and toe walking are normal. Tandem gait is normal.  Romberg is absent.     DIAGNOSTIC DATA (LABS, IMAGING, TESTING) - I reviewed patient records, labs, notes, testing and imaging myself where available.    ASSESSMENT AND PLAN  [name] [name] is a 64 y.o. male with recurrent episode of  uncontrollable right arm shaking, suggestive of simple partial seizure, responded to Keppra , significant decrease  off recurrent episodes on titrating dose of Keppra  Keep current dose 750 mg 2 tablets twice a day Refilled his prescription, Return to clinic in 6 months  Marcial Pacas, M.D. Ph.D.  [facility] [address] [place], [place]  [id number] [phone]

## 2015-09-13 ENCOUNTER — Other Ambulatory Visit: Payer: Self-pay

## 2015-10-18 DIAGNOSIS — I10 Essential (primary) hypertension: Secondary | ICD-10-CM | POA: Diagnosis not present

## 2015-10-18 DIAGNOSIS — E785 Hyperlipidemia, unspecified: Secondary | ICD-10-CM | POA: Diagnosis not present

## 2015-11-05 ENCOUNTER — Ambulatory Visit: Payer: Self-pay | Admitting: Endocrinology

## 2015-11-23 ENCOUNTER — Ambulatory Visit: Payer: Self-pay | Admitting: Endocrinology

## 2015-11-24 ENCOUNTER — Ambulatory Visit (INDEPENDENT_AMBULATORY_CARE_PROVIDER_SITE_OTHER): Payer: Commercial Managed Care - HMO | Admitting: Endocrinology

## 2015-11-24 ENCOUNTER — Encounter: Payer: Self-pay | Admitting: Endocrinology

## 2015-11-24 VITALS — BP 137/94 | HR 66 | Temp 99.1°F | Wt 248.0 lb

## 2015-11-24 DIAGNOSIS — E291 Testicular hypofunction: Secondary | ICD-10-CM | POA: Diagnosis not present

## 2015-11-24 LAB — CBC WITH DIFFERENTIAL/PLATELET
BASOS ABS: 0.1 10*3/uL (ref 0.0–0.1)
Basophils Relative: 0.7 % (ref 0.0–3.0)
EOS PCT: 6.2 % — AB (ref 0.0–5.0)
Eosinophils Absolute: 0.6 10*3/uL (ref 0.0–0.7)
HEMATOCRIT: 38 % — AB (ref 39.0–52.0)
Hemoglobin: 11.6 g/dL — ABNORMAL LOW (ref 13.0–17.0)
LYMPHS PCT: 27.9 % (ref 12.0–46.0)
Lymphs Abs: 2.8 10*3/uL (ref 0.7–4.0)
MCHC: 30.6 g/dL (ref 30.0–36.0)
Monocytes Absolute: 0.7 10*3/uL (ref 0.1–1.0)
Monocytes Relative: 7.1 % (ref 3.0–12.0)
Neutro Abs: 5.8 10*3/uL (ref 1.4–7.7)
Neutrophils Relative %: 58.1 % (ref 43.0–77.0)
Platelets: 342 10*3/uL (ref 150.0–400.0)
RBC: 5.56 Mil/uL (ref 4.22–5.81)
RDW: 16.9 % — ABNORMAL HIGH (ref 11.5–15.5)
WBC: 9.9 10*3/uL (ref 4.0–10.5)

## 2015-11-24 LAB — TSH: TSH: 1.54 u[IU]/mL (ref 0.35–4.50)

## 2015-11-24 NOTE — Patient Instructions (Signed)
blood tests are requested for you today.  We'll let you know about the results.   normalization of testosterone is not known to harm you.  however, there are "theoretical" risks, including increased fertility, hair loss, prostate cancer, benign prostate enlargement, blood clots, liver problems, lower hdl ("good cholesterol"), polycythemia (opposite of anemia), sleep apnea, and behavior changes.   Please come back for a follow-up appointment in 6 months.

## 2015-11-24 NOTE — Progress Notes (Signed)
Subjective:    Patient ID: [name] [name], male    DOB: Sep 22, 1951, 64 y.o.   MRN: [id number]  HPI Pt returns for f/u of idiopathic central hypogonadism (he has 1 biological child; he has never taken illicit androgens; he denies any h/o infertility; he was seen at [facility], where he was dx'ed with hypogonadotropic hypogonadism; however, pt says he wishes to f/u here in [place] instead; he was rx'ed with clomid starting in early 2015).  He has not recently taken the clomid.  pt states he feels well in general. Past Medical History  Diagnosis Date  . Hypertension   . Asthma   . Atrial fibrillation ([place])   . CHF (congestive heart failure) ([place])   . Anemia   . Heart disease     Past Surgical History  Procedure Laterality Date  . Esophagus surgery      Social History   Social History  . Marital Status: Single    Spouse Name: N/A  . Number of Children: 1  . Years of Education: college   Occupational History  . [employer]    Social History Main Topics  . Smoking status: Never Smoker   . Smokeless tobacco: Never Used  . Alcohol Use: No  . Drug Use: No  . Sexual Activity: Not Currently   Other Topics Concern  . Not on file   Social History Narrative   Patient lives at home alone and he is single.   Patient is semi retired.    Education college.   Right handed.   Caffeine two cokes daily.    Current Outpatient Prescriptions on File Prior to Visit  Medication Sig Dispense Refill  . albuterol (PROVENTIL HFA;VENTOLIN HFA) 108 (90 BASE) MCG/ACT inhaler Inhale 2 puffs into the lungs every 6 (six) hours as needed. 1 Inhaler 5  . amiodarone (PACERONE) 200 MG tablet Take 1 tablet (200 mg total) by mouth daily. 60 tablet 2  . aspirin EC 81 MG tablet Take 81 mg by mouth daily.      . clomiPHENE (CLOMID) 50 MG tablet 1/2 tab daily. 15 tablet 5  . clotrimazole-betamethasone (LOTRISONE) cream Apply 1 application topically 2 (two) times daily. 30 g 1  . ferrous sulfate 325 (65  FE) MG tablet Take 325 mg by mouth daily with breakfast.    . fluticasone-salmeterol (ADVAIR HFA) 115-21 MCG/ACT inhaler Inhale 2 puffs into the lungs daily as needed. For shortness of breath 1 Inhaler 3  . ketoconazole (NIZORAL) 2 % cream Apply 1 application topically every other day. Apply to upper thigh area every other day    . levETIRAcetam (KEPPRA) 750 MG tablet Take 2 tablets (1,500 mg total) by mouth 2 (two) times daily. 120 tablet 11  . lisinopril (PRINIVIL,ZESTRIL) 20 MG tablet 20 mg daily.    . metoCLOPramide (REGLAN) 5 MG tablet Take 1 tablet (5 mg total) by mouth 4 (four) times daily. 120 tablet 0  . nitroGLYCERIN (NITROSTAT) 0.4 MG SL tablet Place 1 tablet (0.4 mg total) under the tongue every 5 (five) minutes as needed for chest pain. 25 tablet 1  . omeprazole (PRILOSEC) 40 MG capsule Take 1 capsule (40 mg total) by mouth 2 (two) times daily before a meal. 60 capsule 3  . pantoprazole (PROTONIX) 40 MG tablet Take 40 mg by mouth daily.    . sildenafil (REVATIO) 20 MG tablet 2-5 pills as needed for ED symptoms 50 tablet 10  . Vitamin D, Ergocalciferol, (DRISDOL) 50000 UNITS CAPS capsule Take  1 capsule (50,000 Units total) by mouth every 7 (seven) days. 10 capsule 2   No current facility-administered medications on file prior to visit.    No Known Allergies  Family History  Problem Relation Age of Onset  . Cancer Mother     BP 137/94 mmHg  Pulse 66  Temp(Src) 99.1 F (37.3 C) (Oral)  Wt 248 lb (112.492 kg)  SpO2 99%    Review of Systems Denies decreased urinary stream.      Objective:   Physical Exam VITAL SIGNS:  See vs page GENERAL: no distress GENITALIA: Normal male testicles, scrotum, and penis   Lab Results  Component Value Date   TESTOSTERONE 140* 11/24/2015       Assessment & Plan:  Hypogonadism: therapy limited by noncompliance.  I refilled clomid.  Patient is advised the following: Patient Instructions  blood tests are requested for you today.   We'll let you know about the results.   normalization of testosterone is not known to harm you.  however, there are "theoretical" risks, including increased fertility, hair loss, prostate cancer, benign prostate enlargement, blood clots, liver problems, lower hdl ("good cholesterol"), polycythemia (opposite of anemia), sleep apnea, and behavior changes.   Please come back for a follow-up appointment in 6 months.

## 2015-11-25 ENCOUNTER — Telehealth: Payer: Self-pay | Admitting: Endocrinology

## 2015-11-25 LAB — TESTOSTERONE,FREE AND TOTAL
TESTOSTERONE FREE: 9.4 pg/mL (ref 6.6–18.1)
TESTOSTERONE: 140 ng/dL — AB (ref 348–1197)

## 2015-11-25 MED ORDER — CLOMIPHENE CITRATE 50 MG PO TABS: ORAL_TABLET | ORAL | Status: DC

## 2015-11-25 NOTE — Telephone Encounter (Signed)
please call patient: Correction: Please resume clomiphene.  i have sent a prescription to your pharmacy i'll see you next time.

## 2015-11-26 NOTE — Telephone Encounter (Signed)
Left voicemail advising of note below. Requested a call back if the pt would like to discuss.  

## 2015-11-27 ENCOUNTER — Emergency Department (HOSPITAL_COMMUNITY): Payer: Commercial Managed Care - HMO

## 2015-11-27 ENCOUNTER — Inpatient Hospital Stay (HOSPITAL_COMMUNITY)
Admission: EM | Admit: 2015-11-27 | Discharge: 2015-12-02 | DRG: 871 | Disposition: A | Discharge status: Home / Self Care | Payer: Commercial Managed Care - HMO | Attending: Internal Medicine | Admitting: Internal Medicine

## 2015-11-27 ENCOUNTER — Encounter (HOSPITAL_COMMUNITY): Payer: Self-pay | Admitting: Emergency Medicine

## 2015-11-27 DIAGNOSIS — J189 Pneumonia, unspecified organism: Secondary | ICD-10-CM | POA: Diagnosis present

## 2015-11-27 DIAGNOSIS — I110 Hypertensive heart disease with heart failure: Secondary | ICD-10-CM | POA: Diagnosis present

## 2015-11-27 DIAGNOSIS — R0602 Shortness of breath: Secondary | ICD-10-CM | POA: Diagnosis not present

## 2015-11-27 DIAGNOSIS — I480 Paroxysmal atrial fibrillation: Secondary | ICD-10-CM | POA: Diagnosis present

## 2015-11-27 DIAGNOSIS — I5031 Acute diastolic (congestive) heart failure: Secondary | ICD-10-CM | POA: Diagnosis not present

## 2015-11-27 DIAGNOSIS — A419 Sepsis, unspecified organism: Principal | ICD-10-CM | POA: Diagnosis present

## 2015-11-27 DIAGNOSIS — D638 Anemia in other chronic diseases classified elsewhere: Secondary | ICD-10-CM | POA: Diagnosis present

## 2015-11-27 DIAGNOSIS — Z09 Encounter for follow-up examination after completed treatment for conditions other than malignant neoplasm: Secondary | ICD-10-CM

## 2015-11-27 DIAGNOSIS — I509 Heart failure, unspecified: Secondary | ICD-10-CM

## 2015-11-27 DIAGNOSIS — R000 Tachycardia, unspecified: Secondary | ICD-10-CM | POA: Diagnosis not present

## 2015-11-27 DIAGNOSIS — I10 Essential (primary) hypertension: Secondary | ICD-10-CM | POA: Diagnosis present

## 2015-11-27 DIAGNOSIS — G40909 Epilepsy, unspecified, not intractable, without status epilepticus: Secondary | ICD-10-CM | POA: Diagnosis present

## 2015-11-27 DIAGNOSIS — Z79899 Other long term (current) drug therapy: Secondary | ICD-10-CM

## 2015-11-27 DIAGNOSIS — J9600 Acute respiratory failure, unspecified whether with hypoxia or hypercapnia: Secondary | ICD-10-CM | POA: Diagnosis present

## 2015-11-27 DIAGNOSIS — I4891 Unspecified atrial fibrillation: Secondary | ICD-10-CM | POA: Diagnosis present

## 2015-11-27 DIAGNOSIS — J45901 Unspecified asthma with (acute) exacerbation: Secondary | ICD-10-CM | POA: Diagnosis not present

## 2015-11-27 DIAGNOSIS — D649 Anemia, unspecified: Secondary | ICD-10-CM | POA: Diagnosis present

## 2015-11-27 DIAGNOSIS — Z7982 Long term (current) use of aspirin: Secondary | ICD-10-CM | POA: Diagnosis not present

## 2015-11-27 DIAGNOSIS — I119 Hypertensive heart disease without heart failure: Secondary | ICD-10-CM

## 2015-11-27 DIAGNOSIS — R0600 Dyspnea, unspecified: Secondary | ICD-10-CM | POA: Diagnosis not present

## 2015-11-27 DIAGNOSIS — J90 Pleural effusion, not elsewhere classified: Secondary | ICD-10-CM | POA: Diagnosis not present

## 2015-11-27 HISTORY — DX: Chronic obstructive pulmonary disease, unspecified: J44.9

## 2015-11-27 LAB — URINALYSIS, ROUTINE W REFLEX MICROSCOPIC
BILIRUBIN URINE: NEGATIVE
Glucose, UA: NEGATIVE mg/dL
KETONES UR: NEGATIVE mg/dL
Leukocytes, UA: NEGATIVE
Nitrite: NEGATIVE
PROTEIN: NEGATIVE mg/dL
Specific Gravity, Urine: 1.025 (ref 1.005–1.030)
pH: 5.5 (ref 5.0–8.0)

## 2015-11-27 LAB — CBC
HEMATOCRIT: 37.7 % — AB (ref 39.0–52.0)
Hemoglobin: 11.7 g/dL — ABNORMAL LOW (ref 13.0–17.0)
MCH: 21 pg — AB (ref 26.0–34.0)
MCHC: 31 g/dL (ref 30.0–36.0)
MCV: 67.8 fL — AB (ref 78.0–100.0)
Platelets: 368 10*3/uL (ref 150–400)
RBC: 5.56 MIL/uL (ref 4.22–5.81)
RDW: 15.8 % — AB (ref 11.5–15.5)
WBC: 15.8 10*3/uL — ABNORMAL HIGH (ref 4.0–10.5)

## 2015-11-27 LAB — I-STAT ARTERIAL BLOOD GAS, ED
Acid-base deficit: 1 mmol/L (ref 0.0–2.0)
BICARBONATE: 23.8 meq/L (ref 20.0–24.0)
O2 Saturation: 99 %
PCO2 ART: 39 mmHg (ref 35.0–45.0)
PO2 ART: 124 mmHg — AB (ref 80.0–100.0)
Patient temperature: 98.7
TCO2: 25 mmol/L (ref 0–100)
pH, Arterial: 7.394 (ref 7.350–7.450)

## 2015-11-27 LAB — BASIC METABOLIC PANEL
Anion gap: 9 (ref 5–15)
BUN: 10 mg/dL (ref 6–20)
CHLORIDE: 100 mmol/L — AB (ref 101–111)
CO2: 25 mmol/L (ref 22–32)
Calcium: 9.2 mg/dL (ref 8.9–10.3)
Creatinine, Ser: 1.28 mg/dL — ABNORMAL HIGH (ref 0.61–1.24)
GFR calc Af Amer: >60 mL/min (ref >60)
GFR calc non Af Amer: 57 mL/min — ABNORMAL LOW (ref >60)
GLUCOSE: 130 mg/dL — AB (ref 65–99)
POTASSIUM: 3.8 mmol/L (ref 3.5–5.1)
Sodium: 134 mmol/L — ABNORMAL LOW (ref 135–145)

## 2015-11-27 LAB — URINE MICROSCOPIC-ADD ON

## 2015-11-27 LAB — LIPASE, BLOOD: Lipase: 32 U/L (ref 11–51)

## 2015-11-27 LAB — BRAIN NATRIURETIC PEPTIDE: B NATRIURETIC PEPTIDE 5: 18.7 pg/mL (ref 0.0–100.0)

## 2015-11-27 LAB — I-STAT TROPONIN, ED
TROPONIN I, POC: 0 ng/mL (ref 0.00–0.08)
Troponin i, poc: 0 ng/mL (ref 0.00–0.08)

## 2015-11-27 LAB — I-STAT CG4 LACTIC ACID, ED
LACTIC ACID, VENOUS: 2.78 mmol/L — AB (ref 0.5–2.0)
Lactic Acid, Venous: 1.84 mmol/L (ref 0.5–2.0)

## 2015-11-27 MED ORDER — ALBUTEROL (5 MG/ML) CONTINUOUS INHALATION SOLN
10.0000 mg/h | INHALATION_SOLUTION | RESPIRATORY_TRACT | Status: DC
  Administered 2015-11-27: 10 mg/h via RESPIRATORY_TRACT
  Filled 2015-11-27: qty 20

## 2015-11-27 MED ORDER — IPRATROPIUM-ALBUTEROL 0.5-2.5 (3) MG/3ML IN SOLN
3.0000 mL | RESPIRATORY_TRACT | Status: DC
  Administered 2015-11-28 – 2015-11-29 (×8): 3 mL via RESPIRATORY_TRACT
  Filled 2015-11-27 (×9): qty 3

## 2015-11-27 MED ORDER — METHYLPREDNISOLONE SODIUM SUCC 125 MG IJ SOLR
125.0000 mg | Freq: Once | INTRAMUSCULAR | Status: AC
  Administered 2015-11-27: 125 mg via INTRAVENOUS
  Filled 2015-11-27: qty 2

## 2015-11-27 MED ORDER — METHYLPREDNISOLONE SODIUM SUCC 125 MG IJ SOLR
125.0000 mg | Freq: Once | INTRAMUSCULAR | Status: AC
  Administered 2015-11-28: 125 mg via INTRAVENOUS
  Filled 2015-11-27: qty 2

## 2015-11-27 MED ORDER — ALBUTEROL SULFATE (2.5 MG/3ML) 0.083% IN NEBU: 2.5000 mg | INHALATION_SOLUTION | RESPIRATORY_TRACT | Status: AC | PRN

## 2015-11-27 MED ORDER — SODIUM CHLORIDE 0.9 % IV SOLN
1500.0000 mg | Freq: Two times a day (BID) | INTRAVENOUS | Status: DC
  Administered 2015-11-27: 1500 mg via INTRAVENOUS
  Filled 2015-11-27 (×3): qty 15

## 2015-11-27 MED ORDER — VANCOMYCIN HCL IN DEXTROSE 1-5 GM/200ML-% IV SOLN
1000.0000 mg | Freq: Once | INTRAVENOUS | Status: AC
  Administered 2015-11-27: 1000 mg via INTRAVENOUS
  Filled 2015-11-27: qty 200

## 2015-11-27 MED ORDER — PANTOPRAZOLE SODIUM 40 MG IV SOLR
40.0000 mg | INTRAVENOUS | Status: DC
  Administered 2015-11-27 – 2015-11-28 (×2): 40 mg via INTRAVENOUS
  Filled 2015-11-27 (×2): qty 40

## 2015-11-27 MED ORDER — ALBUTEROL SULFATE (2.5 MG/3ML) 0.083% IN NEBU
5.0000 mg | INHALATION_SOLUTION | Freq: Once | RESPIRATORY_TRACT | Status: AC
  Administered 2015-11-27: 5 mg via RESPIRATORY_TRACT
  Filled 2015-11-27: qty 6

## 2015-11-27 MED ORDER — PIPERACILLIN-TAZOBACTAM 3.375 G IVPB 30 MIN
3.3750 g | Freq: Once | INTRAVENOUS | Status: AC
  Administered 2015-11-27: 3.375 g via INTRAVENOUS
  Filled 2015-11-27: qty 50

## 2015-11-27 MED ORDER — SODIUM CHLORIDE 0.9 % IV BOLUS (SEPSIS)
2000.0000 mL | Freq: Once | INTRAVENOUS | Status: AC
  Administered 2015-11-27: 2000 mL via INTRAVENOUS

## 2015-11-27 NOTE — ED Notes (Signed)
Pt sts left sided CP with SOB x 4 days getting more severe

## 2015-11-27 NOTE — ED Notes (Signed)
Portable chest xray present at the bedside, Bipap in place as patient having labored and rapid respirations and sweating. Dr. Lita Mains aware. IV placed in right hand. Explained to the patient no exiting the bed at this time.

## 2015-11-27 NOTE — ED Notes (Signed)
Dr. Arnoldo Morale, admitting MD, and respiratory therapist, at the bedside.

## 2015-11-27 NOTE — ED Notes (Signed)
Patient refused second IV insertion, discussed return to place later. He acknowledges, is more calm, but requests a delay for second attempt at this time. First set of cultures and labs already collected.

## 2015-11-27 NOTE — ED Notes (Signed)
Respiratory called for breathing treatment ordered and ABG. Elmo Putt acknowledges.

## 2015-11-27 NOTE — Progress Notes (Signed)
Placed patient on BiPAP due to increased and labored work of breathing. Sweating and RR >45 Patient is still breathing rapidly and RT will continue to monitor.

## 2015-11-27 NOTE — ED Notes (Signed)
Inquired with bed placement, awaiting room assignment.

## 2015-11-27 NOTE — ED Provider Notes (Signed)
CSN: [id number]     Arrival date & time 11/27/15  1831 History   First MD Initiated Contact with Patient 11/27/15 1939     Chief Complaint  Patient presents with  . Chest Pain  . Shortness of Breath     (Consider location/radiation/quality/duration/timing/severity/associated sxs/prior Treatment) HPI Patient with history of asthma and CHF presents with 4 days of progressive shortness of breath, wheezing, cough productive of yellow sputum. Patient complains of left-sided chest pain. This has been present for 4 days well. Describes the pain as sharp. Denies any new lower extremity swelling. Also has had subjective fevers and chills. No recent hospitalizations Past Medical History  Diagnosis Date  . Hypertension   . Asthma   . Atrial fibrillation ([place])   . CHF (congestive heart failure) ([place])   . Anemia   . Heart disease    Past Surgical History  Procedure Laterality Date  . Esophagus surgery     Family History  Problem Relation Age of Onset  . Cancer Mother    Social History  Substance Use Topics  . Smoking status: Never Smoker   . Smokeless tobacco: Never Used  . Alcohol Use: No    Review of Systems  Constitutional: Positive for fever and chills.  HENT: Negative for congestion and sore throat.   Respiratory: Positive for cough, shortness of breath and wheezing. Negative for chest tightness.   Cardiovascular: Positive for chest pain. Negative for palpitations and leg swelling.  Gastrointestinal: Negative for nausea, vomiting, abdominal pain and diarrhea.  Genitourinary: Negative for dysuria, frequency, hematuria and flank pain.  Musculoskeletal: Negative for back pain, neck pain and neck stiffness.  Skin: Negative for rash and wound.  Neurological: Negative for dizziness, syncope, weakness, light-headedness, numbness and headaches.  All other systems reviewed and are negative.     Allergies  Review of patient's allergies indicates no known allergies.  Home  Medications   Prior to Admission medications   Medication Sig Start Date End Date Taking? Authorizing Provider  albuterol (PROVENTIL HFA;VENTOLIN HFA) 108 (90 BASE) MCG/ACT inhaler Inhale 2 puffs into the lungs every 6 (six) hours as needed. 03/12/15  Yes Lorayne Marek, MD  amiodarone (PACERONE) 200 MG tablet Take 1 tablet (200 mg total) by mouth daily. 01/23/14  Yes Reyne Dumas, MD  aspirin EC 81 MG tablet Take 81 mg by mouth daily.     Yes Historical Provider, MD  clomiPHENE (CLOMID) 50 MG tablet 1/2 tab daily. Patient taking differently: Take 25 mg by mouth daily. 1/2 tab daily. 11/25/15  Yes Renato Shin, MD  clotrimazole-betamethasone (LOTRISONE) cream Apply 1 application topically 2 (two) times daily. Patient taking differently: Apply 1 application topically 2 (two) times daily as needed (FOR RASH).  03/12/15  Yes Lorayne Marek, MD  ferrous sulfate 325 (65 FE) MG tablet Take 325 mg by mouth daily with breakfast.   Yes Historical Provider, MD  fluticasone-salmeterol (ADVAIR HFA) 115-21 MCG/ACT inhaler Inhale 2 puffs into the lungs daily as needed. For shortness of breath 03/12/15  Yes Lorayne Marek, MD  levETIRAcetam (KEPPRA) 750 MG tablet Take 2 tablets (1,500 mg total) by mouth 2 (two) times daily. 04/01/15  Yes Marcial Pacas, MD  lisinopril (PRINIVIL,ZESTRIL) 20 MG tablet Take 20 mg by mouth daily.  12/02/14  Yes Historical Provider, MD  metoCLOPramide (REGLAN) 5 MG tablet Take 1 tablet (5 mg total) by mouth 4 (four) times daily. 10/10/13  Yes Debbe Odea, MD  nitroGLYCERIN (NITROSTAT) 0.4 MG SL tablet Place 1 tablet (0.4 mg  total) under the tongue every 5 (five) minutes as needed for chest pain. 01/25/14  Yes Dixie Dials, MD  omeprazole (PRILOSEC) 40 MG capsule Take 1 capsule (40 mg total) by mouth 2 (two) times daily before a meal. 10/10/13  Yes Debbe Odea, MD  pantoprazole (PROTONIX) 40 MG tablet Take 40 mg by mouth daily. 07/24/14  Yes Historical Provider, MD  sildenafil (REVATIO) 20 MG tablet  2-5 pills as needed for ED symptoms Patient taking differently: Take 20 mg by mouth daily.  03/03/15  Yes Renato Shin, MD  Vitamin D, Ergocalciferol, (DRISDOL) 50000 UNITS CAPS capsule Take 1 capsule (50,000 Units total) by mouth every 7 (seven) days. Patient taking differently: Take 50,000 Units by mouth every Wednesday.  01/27/14  Yes Reyne Dumas, MD   BP 143/90 mmHg  Pulse 123  Temp(Src) 100.5 F (38.1 C) (Oral)  Resp 36  Ht 5\' 7"  (1.702 m)  Wt 245 lb (111.131 kg)  BMI 38.36 kg/m2  SpO2 99% Physical Exam  Constitutional: He is oriented to person, place, and time. He appears well-developed and well-nourished. He appears distressed.  HENT:  Head: Normocephalic and atraumatic.  Mouth/Throat: Oropharynx is clear and moist. No oropharyngeal exudate.  Eyes: EOM are normal. Pupils are equal, round, and reactive to light.  Neck: Normal range of motion. Neck supple. JVD (mild JVD elevation) present.  No meningismus  Cardiovascular: Regular rhythm.  Exam reveals no gallop and no friction rub.   No murmur heard. Tachycardia  Pulmonary/Chest: He is in respiratory distress. He has wheezes. He has no rales. He exhibits no tenderness.  Patient with increased risk to effort. Diffuse extremity wheezing throughout. Decrease breath sounds left base.  Abdominal: Soft. Bowel sounds are normal. He exhibits no distension and no mass. There is no tenderness. There is no rebound and no guarding.  Musculoskeletal: Normal range of motion. He exhibits no edema or tenderness.  1+ bilateral pitting edema. No calf swelling or tenderness.  Lymphadenopathy:    He has no cervical adenopathy.  Neurological: He is alert and oriented to person, place, and time.  Moves all extremities without deficit. Sensation is fully intact.  Skin: Skin is warm and dry. No rash noted. No erythema.  Psychiatric: He has a normal mood and affect. His behavior is normal.  Nursing note and vitals reviewed.   ED Course  Procedures  (including critical care time) Labs Review Labs Reviewed  BASIC METABOLIC PANEL - Abnormal; Notable for the following:    Sodium 134 (*)    Chloride 100 (*)    Glucose, Bld 130 (*)    Creatinine, Ser 1.28 (*)    GFR calc non Af Amer 57 (*)    All other components within normal limits  CBC - Abnormal; Notable for the following:    WBC 15.8 (*)    Hemoglobin 11.7 (*)    HCT 37.7 (*)    MCV 67.8 (*)    MCH 21.0 (*)    RDW 15.8 (*)    All other components within normal limits  URINALYSIS, ROUTINE W REFLEX MICROSCOPIC (NOT AT [facility]) - Abnormal; Notable for the following:    Color, Urine AMBER (*)    Hgb urine dipstick SMALL (*)    All other components within normal limits  URINE MICROSCOPIC-ADD ON - Abnormal; Notable for the following:    Squamous Epithelial / LPF 6-30 (*)    Bacteria, UA RARE (*)    All other components within normal limits  I-STAT CG4 LACTIC ACID,  ED - Abnormal; Notable for the following:    Lactic Acid, Venous 2.78 (*)    All other components within normal limits  CULTURE, BLOOD (ROUTINE X 2)  CULTURE, BLOOD (ROUTINE X 2)  URINE CULTURE  BRAIN NATRIURETIC PEPTIDE  LIPASE, BLOOD  BLOOD GAS, ARTERIAL  I-STAT TROPOININ, ED  Randolm Idol, ED    Imaging Review Dg Chest Port 1 View  11/27/2015  CLINICAL DATA:  Shortness of breath for the past 3-4 hours. EXAM: PORTABLE CHEST 1 VIEW COMPARISON:  01/24/2014. FINDINGS: Poor inspiration with a significantly decreased inspiration compared to the previous examination. This is magnifying the cardiac silhouette. Taking this and patient rotation to the right into account, there has probably been no significant change in a normal sized heart. The aorta is tortuous. The pulmonary vasculature is crowded by the poor inspiration. There is minimal ill-defined opacity at the left lateral lung base. Unremarkable bones. IMPRESSION: Very poor inspiration with minimal probable atelectasis at the left lung base. Electronically Signed    By: Claudie Revering M.D.   On: 11/27/2015 20:08   I have personally reviewed and evaluated these images and lab results as part of my medical decision-making.   EKG Interpretation   Date/Time:  Saturday November 27 2015 18:47:31 EST Ventricular Rate:  123 PR Interval:  126 QRS Duration: 76 QT Interval:  304 QTC Calculation: 435 R Axis:   -35 Text Interpretation:  Sinus tachycardia Left axis deviation Cannot rule  out Anterior infarct , age undetermined Abnormal ECG Confirmed by  Lita Mains  MD, Denay Pleitez ([id number]) on 11/27/2015 9:21:55 PM      CRITICAL CARE Performed by: Lita Mains, Lyrical Sowle Total critical care time: 30 minutes Critical care time was exclusive of separately billable procedures and treating other patients. Critical care was necessary to treat or prevent imminent or life-threatening deterioration. Critical care was time spent personally by me on the following activities: development of treatment plan with patient and/or surrogate as well as nursing, discussions with consultants, evaluation of patient's response to treatment, examination of patient, obtaining history from patient or surrogate, ordering and performing treatments and interventions, ordering and review of laboratory studies, ordering and review of radiographic studies, pulse oximetry and re-evaluation of patient's condition.   MDM   Final diagnoses:  Sepsis due to pneumonia ([place])  Asthma exacerbation    Concern for possible CHF exacerbation versus asthma exacerbation versus pneumonia area chest x-ray with left basilar infiltrate. No evidence of fluid overload. Normal BNP. Mild elevation in white blood cells and lactic acid. Blood cultures drawn and treated with broad-spectrum antibiotics. Given IV fluids judiciously given history of CHF and elevated blood pressure. Started on BiPAP and given albuterol neb with Solu-Medrol. Patient states he is feeling much better. Still has expiratory wheezing but work of breathing has  improved. Dr. Arnoldo Morale. She will admit to step down bed.     Julianne Rice, MD 11/27/15 2122

## 2015-11-27 NOTE — H&P (Addendum)
[facility] Admission History and Physical       [name] [name] [id number] DOB: 09/01/1951 DOA: 11/27/2015  Referring physician: EDP  PCP: Lorayne Marek, MD  Specialists:   Chief Complaint: SOB and Fevers  HPI: [name] [name] is a 64 y.o. male with a history of Asthma, CHF, HTN, Atrial Fibrillation who presented to the ED with complaints of increased SOB and Cough and Fevers and Chills x 4 days.   He had worsening this afternoon.   He also had increased wheezing.   He was found to have a fever to 100.5, and was tachycardic to the 130's, and tachypneic to the 40s.   He was placed on BiPAP, and administered Nebs, and a sepsis workup was initiated.   He was placed on IV Vancomycin and Zosyn and referred for admission to a Stepdown bed.        Review of Systems:  Constitutional: No Weight Loss, No Weight Gain, Night Sweats, Fevers, Chills, Dizziness, Light Headedness, Fatigue, or Generalized Weakness HEENT: No Headaches, Difficulty Swallowing,Tooth/Dental Problems,Sore Throat,  No Sneezing, Rhinitis, Ear Ache, Nasal Congestion, or Post Nasal Drip,  Cardio-vascular:  No Chest pain, Orthopnea, PND, Edema in Lower Extremities, Anasarca, Dizziness, Palpitations  Resp:  +Dyspnea, No DOE, No Productive Cough, No Non-Productive Cough, No Hemoptysis, No Wheezing.    GI: No Heartburn, Indigestion, Abdominal Pain, Nausea, Vomiting, Diarrhea, Constipation, Hematemesis, Hematochezia, Melena, Change in Bowel Habits,  Loss of Appetite  GU: No Dysuria, No Change in Color of Urine, No Urgency or Urinary Frequency, No Flank pain.  Musculoskeletal: No Joint Pain or Swelling, No Decreased Range of Motion, No Back Pain.  Neurologic: No Syncope, No Seizures, Muscle Weakness, Paresthesia, Vision Disturbance or Loss, No Diplopia, No Vertigo, No Difficulty Walking,  Skin: No Rash or Lesions. Psych: No Change in Mood or Affect, No Depression or Anxiety, No Memory loss, No Confusion, or  Hallucinations   Past Medical History  Diagnosis Date  . Hypertension   . Asthma   . Atrial fibrillation ([place])   . CHF (congestive heart failure) ([place])   . Anemia   . Heart disease      Past Surgical History  Procedure Laterality Date  . Esophagus surgery        Prior to Admission medications   Medication Sig Start Date End Date Taking? Authorizing Provider  albuterol (PROVENTIL HFA;VENTOLIN HFA) 108 (90 BASE) MCG/ACT inhaler Inhale 2 puffs into the lungs every 6 (six) hours as needed. 03/12/15  Yes Lorayne Marek, MD  amiodarone (PACERONE) 200 MG tablet Take 1 tablet (200 mg total) by mouth daily. 01/23/14  Yes Reyne Dumas, MD  aspirin EC 81 MG tablet Take 81 mg by mouth daily.     Yes Historical Provider, MD  clomiPHENE (CLOMID) 50 MG tablet 1/2 tab daily. Patient taking differently: Take 25 mg by mouth daily. 1/2 tab daily. 11/25/15  Yes Renato Shin, MD  clotrimazole-betamethasone (LOTRISONE) cream Apply 1 application topically 2 (two) times daily. Patient taking differently: Apply 1 application topically 2 (two) times daily as needed (FOR RASH).  03/12/15  Yes Lorayne Marek, MD  ferrous sulfate 325 (65 FE) MG tablet Take 325 mg by mouth daily with breakfast.   Yes Historical Provider, MD  fluticasone-salmeterol (ADVAIR HFA) 115-21 MCG/ACT inhaler Inhale 2 puffs into the lungs daily as needed. For shortness of breath 03/12/15  Yes Lorayne Marek, MD  levETIRAcetam (KEPPRA) 750 MG tablet Take 2 tablets (1,500 mg total) by mouth 2 (two) times daily. 04/01/15  Yes Marcial Pacas, MD  lisinopril (PRINIVIL,ZESTRIL) 20 MG tablet Take 20 mg by mouth daily.  12/02/14  Yes Historical Provider, MD  metoCLOPramide (REGLAN) 5 MG tablet Take 1 tablet (5 mg total) by mouth 4 (four) times daily. 10/10/13  Yes Debbe Odea, MD  nitroGLYCERIN (NITROSTAT) 0.4 MG SL tablet Place 1 tablet (0.4 mg total) under the tongue every 5 (five) minutes as needed for chest pain. 01/25/14  Yes Dixie Dials, MD  omeprazole  (PRILOSEC) 40 MG capsule Take 1 capsule (40 mg total) by mouth 2 (two) times daily before a meal. 10/10/13  Yes Debbe Odea, MD  pantoprazole (PROTONIX) 40 MG tablet Take 40 mg by mouth daily. 07/24/14  Yes Historical Provider, MD  sildenafil (REVATIO) 20 MG tablet 2-5 pills as needed for ED symptoms Patient taking differently: Take 20 mg by mouth daily.  03/03/15  Yes Renato Shin, MD  Vitamin D, Ergocalciferol, (DRISDOL) 50000 UNITS CAPS capsule Take 1 capsule (50,000 Units total) by mouth every 7 (seven) days. Patient taking differently: Take 50,000 Units by mouth every Wednesday.  01/27/14  Yes Reyne Dumas, MD     No Known Allergies      Social History:  reports that he has never smoked. He has never used smokeless tobacco. He reports that he does not drink alcohol or use illicit drugs.     Family History  Problem Relation Age of Onset  . Cancer Mother        Physical Exam:  GEN:  Pleasant Obese 64 y.o. African American male examined and in no acute distress; cooperative with exam Filed Vitals:   11/27/15 2030 11/27/15 2034 11/27/15 2045 11/27/15 2100  BP: 153/94  143/90 151/94  Pulse: 121  123 115  Temp:      TempSrc:      Resp: 27  36 26  Height:      Weight:      SpO2: 100% 100% 99% 99%   Blood pressure 151/94, pulse 115, temperature 100.5 F (38.1 C), temperature source Oral, resp. rate 26, height 5\' 7"  (1.702 m), weight 111.131 kg (245 lb), SpO2 99 %. PSYCH: She is alert and oriented x4; does not appear anxious does not appear depressed; affect is normal HEENT: Normocephalic and Atraumatic, Mucous membranes pink; PERRLA; EOM intact; Fundi:  Benign;  No scleral icterus, Nares: Patent, Oropharynx: Clear, Sparse Dentition,    Neck:  FROM, No Cervical Lymphadenopathy nor Thyromegaly or Carotid Bruit; No JVD; Breasts:: Not examined CHEST WALL: No tenderness CHEST: Normal respiration, clear to auscultation bilaterally HEART: Regular rate and rhythm; no murmurs rubs or  gallops BACK: No kyphosis or scoliosis; No CVA tenderness ABDOMEN: Positive Bowel Sounds, Obese, Soft Non-Tender, No Rebound or Guarding; No Masses, No Organomegaly Rectal Exam: Not done EXTREMITIES: No cyanosis, Clubbing, and Trace Edema of BLEs Genitalia: not examined PULSES: 2+ and symmetric SKIN: Normal hydration no rash or ulceration CNS:  Alert and Oriented x 4, No Focal Deficits Vascular: pulses palpable throughout    Labs on Admission:  Basic Metabolic Panel:  Recent Labs Lab 11/27/15 1911  NA 134*  K 3.8  CL 100*  CO2 25  GLUCOSE 130*  BUN 10  CREATININE 1.28*  CALCIUM 9.2   Liver Function Tests: No results for input(s): AST, ALT, ALKPHOS, BILITOT, PROT, ALBUMIN in the last 168 hours.  Recent Labs Lab 11/27/15 2015  LIPASE 32   No results for input(s): AMMONIA in the last 168 hours. CBC:  Recent Labs Lab 11/24/15 1052 11/27/15  1911  WBC 9.9 15.8*  NEUTROABS 5.8  --   HGB 11.6* 11.7*  HCT 38.0* 37.7*  MCV 68.3 Repeated and verified X2.* 67.8*  PLT 342.0 368   Cardiac Enzymes: No results for input(s): CKTOTAL, CKMB, CKMBINDEX, TROPONINI in the last 168 hours.  BNP (last 3 results)  Recent Labs  11/27/15 1911  BNP 18.7    ProBNP (last 3 results) No results for input(s): PROBNP in the last 8760 hours.  CBG: No results for input(s): GLUCAP in the last 168 hours.  Radiological Exams on Admission: Dg Chest Port 1 View  11/27/2015  CLINICAL DATA:  Shortness of breath for the past 3-4 hours. EXAM: PORTABLE CHEST 1 VIEW COMPARISON:  01/24/2014. FINDINGS: Poor inspiration with a significantly decreased inspiration compared to the previous examination. This is magnifying the cardiac silhouette. Taking this and patient rotation to the right into account, there has probably been no significant change in a normal sized heart. The aorta is tortuous. The pulmonary vasculature is crowded by the poor inspiration. There is minimal ill-defined opacity at the  left lateral lung base. Unremarkable bones. IMPRESSION: Very poor inspiration with minimal probable atelectasis at the left lung base. Electronically Signed   By: Claudie Revering M.D.   On: 11/27/2015 20:08     EKG: Independently reviewed. Sinus Tachycardia rate =123    Assessment/Plan:       64 y.o. male with  Principal Problem:  1.     Sepsis due to pneumonia ([place])   Sepsis Workup   IV Vancomycin and Zosyn   Active Problems:  2.    Acute respiratory failure (HCC)   BiPAP/O2 PRN   Check ABG    3.    Asthma exacerbation   DuoNebs   Steroid Taper    4.    CHF (congestive heart failure) (HCC)   Monitor for Fluid overload   2D ECHO in AM    5.    Essential hypertension, benign   On Lisinopril Rx   PRN IV Hydralazine while NPO on BIPAP   Monitor BPs      6.    Atrial fibrillation (HCC)-chad Score =2, 4% CVA risk   On Amiodarone Rx   And ASA  Rx   Not on Coumadin due to Seizures    7.    Seizures   Continue Keppra Rx, IV while on BiPAP    8.    Anemia   Monitor Hbs   Resume Iron supplement when taking PO    9.   DVT Prophylaxis     Lovenox   Code Status:     FULL CODE        Family Communication:   No Family Present    Disposition Plan:    Inpatient Status        Time spent:  [facility] Pager [phone]   If [place] Please Contact the Day Rounding Team MD for [facility]  If 7PM-7AM, Please Contact Night-Floor Coverage  [email or web address] Password [email or web address] 11/27/2015, 9:20 PM     ADDENDUM:   Patient was seen and examined on 11/27/2015

## 2015-11-28 ENCOUNTER — Encounter (HOSPITAL_COMMUNITY): Payer: Self-pay | Admitting: Emergency Medicine

## 2015-11-28 LAB — CBC
HCT: 34.5 % — ABNORMAL LOW (ref 39.0–52.0)
Hemoglobin: 10.5 g/dL — ABNORMAL LOW (ref 13.0–17.0)
MCH: 20.8 pg — AB (ref 26.0–34.0)
MCHC: 30.4 g/dL (ref 30.0–36.0)
MCV: 68.2 fL — AB (ref 78.0–100.0)
PLATELETS: 319 10*3/uL (ref 150–400)
RBC: 5.06 MIL/uL (ref 4.22–5.81)
RDW: 16.2 % — AB (ref 11.5–15.5)
WBC: 16.7 10*3/uL — ABNORMAL HIGH (ref 4.0–10.5)

## 2015-11-28 LAB — BASIC METABOLIC PANEL
ANION GAP: 8 (ref 5–15)
BUN: 11 mg/dL (ref 6–20)
CHLORIDE: 104 mmol/L (ref 101–111)
CO2: 23 mmol/L (ref 22–32)
CREATININE: 1.33 mg/dL — AB (ref 0.61–1.24)
Calcium: 8.6 mg/dL — ABNORMAL LOW (ref 8.9–10.3)
GFR calc non Af Amer: 54 mL/min — ABNORMAL LOW (ref >60)
Glucose, Bld: 183 mg/dL — ABNORMAL HIGH (ref 65–99)
Potassium: 4.2 mmol/L (ref 3.5–5.1)
SODIUM: 135 mmol/L (ref 135–145)

## 2015-11-28 LAB — PROTIME-INR
INR: 1.17 (ref 0.00–1.49)
Prothrombin Time: 15.1 seconds (ref 11.6–15.2)

## 2015-11-28 LAB — LACTIC ACID, PLASMA
Lactic Acid, Venous: 3 mmol/L (ref 0.5–2.0)
Lactic Acid, Venous: 2.8 mmol/L (ref 0.5–2.0)

## 2015-11-28 LAB — PROCALCITONIN: Procalcitonin: 0.16 ng/mL

## 2015-11-28 LAB — MRSA PCR SCREENING: MRSA by PCR: POSITIVE — AB

## 2015-11-28 LAB — APTT: aPTT: 27 seconds (ref 24–37)

## 2015-11-28 MED ORDER — ALBUTEROL SULFATE (2.5 MG/3ML) 0.083% IN NEBU: 2.5000 mg | INHALATION_SOLUTION | RESPIRATORY_TRACT | Status: DC | PRN

## 2015-11-28 MED ORDER — SODIUM CHLORIDE 0.9 % IV SOLN: 250.0000 mL | INTRAVENOUS | Status: DC | PRN

## 2015-11-28 MED ORDER — ENOXAPARIN SODIUM 40 MG/0.4ML ~~LOC~~ SOLN
40.0000 mg | SUBCUTANEOUS | Status: DC
  Administered 2015-11-28 – 2015-12-01 (×4): 40 mg via SUBCUTANEOUS
  Filled 2015-11-28 (×4): qty 0.4

## 2015-11-28 MED ORDER — ONDANSETRON HCL 4 MG PO TABS: 4.0000 mg | ORAL_TABLET | Freq: Four times a day (QID) | ORAL | Status: DC | PRN

## 2015-11-28 MED ORDER — CETYLPYRIDINIUM CHLORIDE 0.05 % MT LIQD
7.0000 mL | Freq: Two times a day (BID) | OROMUCOSAL | Status: DC
  Administered 2015-11-28 (×2): 7 mL via OROMUCOSAL

## 2015-11-28 MED ORDER — NITROGLYCERIN 0.4 MG SL SUBL: 0.4000 mg | SUBLINGUAL_TABLET | SUBLINGUAL | Status: DC | PRN

## 2015-11-28 MED ORDER — LEVETIRACETAM 500 MG PO TABS
1500.0000 mg | ORAL_TABLET | Freq: Two times a day (BID) | ORAL | Status: DC
  Administered 2015-11-28 – 2015-12-02 (×8): 1500 mg via ORAL
  Filled 2015-11-28 (×9): qty 3

## 2015-11-28 MED ORDER — SODIUM CHLORIDE 0.9 % IV SOLN
INTRAVENOUS | Status: DC
  Administered 2015-11-28: 250 mL via INTRAVENOUS

## 2015-11-28 MED ORDER — OXYCODONE HCL 5 MG PO TABS: 5.0000 mg | ORAL_TABLET | ORAL | Status: DC | PRN

## 2015-11-28 MED ORDER — ACETAMINOPHEN 650 MG RE SUPP: 650.0000 mg | Freq: Four times a day (QID) | RECTAL | Status: DC | PRN

## 2015-11-28 MED ORDER — SODIUM CHLORIDE 0.9 % IJ SOLN: 3.0000 mL | INTRAMUSCULAR | Status: DC | PRN

## 2015-11-28 MED ORDER — AMIODARONE HCL 200 MG PO TABS
200.0000 mg | ORAL_TABLET | Freq: Every day | ORAL | Status: DC
  Administered 2015-11-28 – 2015-12-02 (×5): 200 mg via ORAL
  Filled 2015-11-28 (×5): qty 1

## 2015-11-28 MED ORDER — CHLORHEXIDINE GLUCONATE 0.12 % MT SOLN
15.0000 mL | Freq: Two times a day (BID) | OROMUCOSAL | Status: DC
  Administered 2015-11-28: 15 mL via OROMUCOSAL
  Filled 2015-11-28: qty 15

## 2015-11-28 MED ORDER — ONDANSETRON HCL 4 MG/2ML IJ SOLN: 4.0000 mg | Freq: Four times a day (QID) | INTRAMUSCULAR | Status: DC | PRN

## 2015-11-28 MED ORDER — ACETAMINOPHEN 325 MG PO TABS: 650.0000 mg | ORAL_TABLET | Freq: Four times a day (QID) | ORAL | Status: DC | PRN

## 2015-11-28 MED ORDER — SODIUM CHLORIDE 0.9 % IJ SOLN
3.0000 mL | Freq: Two times a day (BID) | INTRAMUSCULAR | Status: DC
  Administered 2015-11-28 – 2015-12-01 (×7): 3 mL via INTRAVENOUS

## 2015-11-28 MED ORDER — PIPERACILLIN-TAZOBACTAM 3.375 G IVPB
3.3750 g | Freq: Three times a day (TID) | INTRAVENOUS | Status: DC
Start: 2015-11-28 — End: 2015-11-30
  Administered 2015-11-28 – 2015-11-30 (×7): 3.375 g via INTRAVENOUS
  Filled 2015-11-28 (×10): qty 50

## 2015-11-28 MED ORDER — METHYLPREDNISOLONE SODIUM SUCC 125 MG IJ SOLR
60.0000 mg | Freq: Two times a day (BID) | INTRAMUSCULAR | Status: DC
  Administered 2015-11-28 – 2015-11-30 (×4): 60 mg via INTRAVENOUS
  Filled 2015-11-28 (×4): qty 2

## 2015-11-28 MED ORDER — ALUM & MAG HYDROXIDE-SIMETH 200-200-20 MG/5ML PO SUSP: 30.0000 mL | Freq: Four times a day (QID) | ORAL | Status: DC | PRN

## 2015-11-28 MED ORDER — CETYLPYRIDINIUM CHLORIDE 0.05 % MT LIQD
7.0000 mL | Freq: Two times a day (BID) | OROMUCOSAL | Status: DC
  Administered 2015-11-28 – 2015-12-02 (×7): 7 mL via OROMUCOSAL

## 2015-11-28 MED ORDER — CHLORHEXIDINE GLUCONATE CLOTH 2 % EX PADS
6.0000 | MEDICATED_PAD | Freq: Every day | CUTANEOUS | Status: DC
  Administered 2015-11-28 – 2015-12-02 (×4): 6 via TOPICAL

## 2015-11-28 MED ORDER — HYDROMORPHONE HCL 1 MG/ML IJ SOLN: 0.5000 mg | INTRAMUSCULAR | Status: DC | PRN

## 2015-11-28 MED ORDER — VANCOMYCIN HCL IN DEXTROSE 750-5 MG/150ML-% IV SOLN
750.0000 mg | Freq: Two times a day (BID) | INTRAVENOUS | Status: DC
Start: 2015-11-28 — End: 2015-11-30
  Administered 2015-11-28 – 2015-11-29 (×4): 750 mg via INTRAVENOUS
  Filled 2015-11-28 (×7): qty 150

## 2015-11-28 MED ORDER — MUPIROCIN 2 % EX OINT
1.0000 "application " | TOPICAL_OINTMENT | Freq: Two times a day (BID) | CUTANEOUS | Status: DC
  Administered 2015-11-28 – 2015-12-02 (×9): 1 via NASAL
  Filled 2015-11-28: qty 22

## 2015-11-28 NOTE — Progress Notes (Signed)
ANTIBIOTIC CONSULT NOTE - INITIAL  Pharmacy Consult for Vancocin and Zosyn Indication: rule out sepsis 2/2 PNA  No Known Allergies  Patient Measurements: Height: 5\' 7"  (170.2 cm) Weight: 245 lb (111.131 kg) IBW/kg (Calculated) : 66.1  Vital Signs: Temp: 100.5 F (38.1 C) (12/03 1854) Temp Source: Oral (12/03 1854) BP: 132/91 mmHg (12/04 0115) Pulse Rate: 80 (12/04 0115)  Labs:  Recent Labs  11/27/15 1911  WBC 15.8*  HGB 11.7*  PLT 368  CREATININE 1.28*   Estimated Creatinine Clearance: 67.5 mL/min (by C-G formula based on Cr of 1.28).  Medical History: Past Medical History  Diagnosis Date  . Hypertension   . Asthma   . Atrial fibrillation ([place])   . CHF (congestive heart failure) ([place])   . Anemia   . Heart disease      Assessment: 64yo male c/o left-sided CP w/ SOB x4d, now getting more severe, CXR shows atelectasis, concern for sepsis 2/2 PNA, to begin IV ABX.  Goal of Therapy:  Vancomycin trough level 15-20 mcg/ml  Plan:  Rec'd vanc 1g and Zosyn 3.375g IV in ED; will continue with vancomycin 750mg  IV Q12H and Zosyn 3.375g IV Q8H and monitor CBC, Cx, levels prn.  Wynona Neat, PharmD, BCPS  11/28/2015,1:27 AM

## 2015-11-28 NOTE — Progress Notes (Signed)
CRITICAL VALUE ALERT  Critical value received:  Lactic Acid 3.0  Date of notification:  11/28/15  Time of notification:  0515  Critical value read back: yes  Nurse who received alert:  S.Lore Polka  MD notified (1st page):  Fredirick Maudlin  Time of first page:  0515  MD notified (2nd page):  Time of second page:  Responding MD:    Time MD responded:

## 2015-11-28 NOTE — ED Notes (Signed)
respirtory therapist present at the bedside.

## 2015-11-28 NOTE — Progress Notes (Signed)
Patient taken off Bi-Pap. Transitioned to 2L [place]. SATS 98%. No SOB, no respiratory distress.   Diet order placed.  Roxan Hockey, RN

## 2015-11-28 NOTE — Progress Notes (Signed)
[facility] PROGRESS NOTE  [name] [name] [id number] DOB: [birth year] DOA: 11/27/2015 PCP: Lorayne Marek, MD  Assessment/Plan: 1. Acute Resp failure - Due to pneumonia/sepsis and asthma exacerbation -Improving, Wean off BiPAP  2. Suspected community-acquired pneumonia -Currently on broad-spectrum antibiotics -Changed to oral Levaquin tomorrow if cultures stay negative  -lactic acid mildly elevated but clinically improving  3. Asthma exacerbation  -antibiotics as above, IV Solu-Medrol  -Duonebs and albuterol as needed   4. Seizure disorder today little better  -Continue Keppra, change to by mouth   5. Paroxysmal atrial fibrillation -In normal sinus rhythm, continue amiodarone and aspirin, reportedly not not on Coumadin due to history of seizure/fall risk   6. Anemia of chronic disease -stable  DVT proph: lovenox  Code Status: Full Code Family Communication: none at bedside Disposition Plan: tx to floor later today  HPI/Subjective: Breathing better  Objective: Filed Vitals:   11/28/15 0805 11/28/15 1000  BP: 128/75 148/85  Pulse: 59 78  Temp: 97.3 F (36.3 C)   Resp: 28 18    Intake/Output Summary (Last 24 hours) at 11/28/15 1151 Last data filed at 11/28/15 0700  Gross per 24 hour  Intake 2068.17 ml  Output    575 ml  Net 1493.17 ml   Filed Weights   11/27/15 2014 11/28/15 0225  Weight: 111.131 kg (245 lb) 111.3 kg (245 lb 6 oz)    Exam:   General: AAOx3, wearing BIPAP  Cardiovascular: S1S2/RRR  Respiratory: CTAB  Abdomen: soft, NT, BS present  Musculoskeletal: no edema    Data Reviewed: Basic Metabolic Panel:  Recent Labs Lab 11/27/15 1911 11/28/15 0355  NA 134* 135  K 3.8 4.2  CL 100* 104  CO2 25 23  GLUCOSE 130* 183*  BUN 10 11  CREATININE 1.28* 1.33*  CALCIUM 9.2 8.6*   Liver Function Tests: No results for input(s): AST, ALT, ALKPHOS, BILITOT, PROT, ALBUMIN in the last 168 hours.  Recent Labs Lab 11/27/15 2015   LIPASE 32   No results for input(s): AMMONIA in the last 168 hours. CBC:  Recent Labs Lab 11/24/15 1052 11/27/15 1911 11/28/15 0355  WBC 9.9 15.8* 16.7*  NEUTROABS 5.8  --   --   HGB 11.6* 11.7* 10.5*  HCT 38.0* 37.7* 34.5*  MCV 68.3 Repeated and verified X2.* 67.8* 68.2*  PLT 342.0 368 319   Cardiac Enzymes: No results for input(s): CKTOTAL, CKMB, CKMBINDEX, TROPONINI in the last 168 hours. BNP (last 3 results)  Recent Labs  11/27/15 1911  BNP 18.7    ProBNP (last 3 results) No results for input(s): PROBNP in the last 8760 hours.  CBG: No results for input(s): GLUCAP in the last 168 hours.  Recent Results (from the past 240 hour(s))  Urine culture     Status: None (Preliminary result)   Collection Time: 11/27/15  8:13 PM  Result Value Ref Range Status   Specimen Description URINE, CLEAN CATCH  Final   Special Requests NONE  Final   Culture NO GROWTH < 24 HOURS  Final   Report Status PENDING  Incomplete  MRSA PCR Screening     Status: Abnormal   Collection Time: 11/28/15  2:42 AM  Result Value Ref Range Status   MRSA by PCR POSITIVE (A) NEGATIVE Final    Comment:        The GeneXpert MRSA Assay (FDA approved for NASAL specimens only), is one component of a comprehensive MRSA colonization surveillance program. It is not intended to diagnose MRSA infection nor  to guide or monitor treatment for MRSA infections. RESULT CALLED TO, READ BACK BY AND VERIFIED WITHVal Riles RN [id number] AT [phone] SKEEN,P      Studies: Dg Chest Port 1 View  11/27/2015  CLINICAL DATA:  Shortness of breath for the past 3-4 hours. EXAM: PORTABLE CHEST 1 VIEW COMPARISON:  01/24/2014. FINDINGS: Poor inspiration with a significantly decreased inspiration compared to the previous examination. This is magnifying the cardiac silhouette. Taking this and patient rotation to the right into account, there has probably been no significant change in a normal sized heart. The aorta is tortuous. The  pulmonary vasculature is crowded by the poor inspiration. There is minimal ill-defined opacity at the left lateral lung base. Unremarkable bones. IMPRESSION: Very poor inspiration with minimal probable atelectasis at the left lung base. Electronically Signed   By: Claudie Revering M.D.   On: 11/27/2015 20:08    Scheduled Meds: . amiodarone  200 mg Oral Daily  . antiseptic oral rinse  7 mL Mouth Rinse q12n4p  . chlorhexidine  15 mL Mouth Rinse BID  . Chlorhexidine Gluconate Cloth  6 each Topical Q0600  . enoxaparin (LOVENOX) injection  40 mg Subcutaneous Q24H  . ipratropium-albuterol  3 mL Nebulization Q4H  . levETIRAcetam  1,500 mg Intravenous Q12H  . mupirocin ointment  1 application Nasal BID  . pantoprazole (PROTONIX) IV  40 mg Intravenous Q24H  . piperacillin-tazobactam (ZOSYN)  IV  3.375 g Intravenous Q8H  . sodium chloride  3 mL Intravenous Q12H  . vancomycin  750 mg Intravenous Q12H   Continuous Infusions: . sodium chloride 250 mL (11/28/15 0511)  . albuterol Stopped (11/27/15 2232)   Antibiotics Given (last 72 hours)    Date/Time Action Medication Dose Rate   11/28/15 0457 Given   piperacillin-tazobactam (ZOSYN) IVPB 3.375 g 3.375 g 12.5 mL/hr   11/28/15 0933 Given   vancomycin (VANCOCIN) IVPB 750 mg/150 ml premix 750 mg 150 mL/hr      Principal Problem:   Sepsis due to pneumonia [facility]) Active Problems:   Essential hypertension, benign   Atrial fibrillation ([place])   Acute respiratory failure ([place])   Asthma exacerbation   Anemia   CHF (congestive heart failure) ([place])   Sepsis ([place])    Time spent: 35min    [facility]  [facility] Pager [phone]. If 7PM-7AM, please contact night-coverage at [email or web address], password [facility] 11/28/2015, 11:51 AM  LOS: 1 day

## 2015-11-28 NOTE — Progress Notes (Signed)
Patient is resting comfortably on 2L [place] with O2 sat 98%. BIPAP is not needed at this time. RT will continue to monitor.

## 2015-11-28 NOTE — Progress Notes (Signed)
Utilization Review Completed.Lenette Rau T12/03/2015  

## 2015-11-29 ENCOUNTER — Inpatient Hospital Stay (HOSPITAL_COMMUNITY): Payer: Commercial Managed Care - HMO

## 2015-11-29 LAB — BASIC METABOLIC PANEL
ANION GAP: 6 (ref 5–15)
BUN: 15 mg/dL (ref 6–20)
CALCIUM: 8.7 mg/dL — AB (ref 8.9–10.3)
CO2: 25 mmol/L (ref 22–32)
Chloride: 107 mmol/L (ref 101–111)
Creatinine, Ser: 1.29 mg/dL — ABNORMAL HIGH (ref 0.61–1.24)
GFR, EST NON AFRICAN AMERICAN: 56 mL/min — AB (ref >60)
GLUCOSE: 135 mg/dL — AB (ref 65–99)
POTASSIUM: 4.7 mmol/L (ref 3.5–5.1)
Sodium: 138 mmol/L (ref 135–145)

## 2015-11-29 LAB — CBC
HEMATOCRIT: 32.7 % — AB (ref 39.0–52.0)
HEMOGLOBIN: 10.3 g/dL — AB (ref 13.0–17.0)
MCH: 21.3 pg — ABNORMAL LOW (ref 26.0–34.0)
MCHC: 31.5 g/dL (ref 30.0–36.0)
MCV: 67.7 fL — ABNORMAL LOW (ref 78.0–100.0)
Platelets: 365 10*3/uL (ref 150–400)
RBC: 4.83 MIL/uL (ref 4.22–5.81)
RDW: 16.4 % — ABNORMAL HIGH (ref 11.5–15.5)
WBC: 29.6 10*3/uL — ABNORMAL HIGH (ref 4.0–10.5)

## 2015-11-29 LAB — URINE CULTURE

## 2015-11-29 MED ORDER — IPRATROPIUM-ALBUTEROL 0.5-2.5 (3) MG/3ML IN SOLN
3.0000 mL | Freq: Three times a day (TID) | RESPIRATORY_TRACT | Status: DC
  Administered 2015-11-29 – 2015-12-02 (×9): 3 mL via RESPIRATORY_TRACT
  Filled 2015-11-29 (×9): qty 3

## 2015-11-29 MED ORDER — PANTOPRAZOLE SODIUM 40 MG PO TBEC
40.0000 mg | DELAYED_RELEASE_TABLET | Freq: Every day | ORAL | Status: DC
  Administered 2015-11-29 – 2015-12-02 (×4): 40 mg via ORAL
  Filled 2015-11-29 (×4): qty 1

## 2015-11-29 NOTE — Progress Notes (Signed)
[facility] PROGRESS NOTE  [name] [name] [id number] DOB: 1951-08-25 DOA: 11/27/2015 PCP: Lorayne Marek, MD  Assessment/Plan: 1. Acute Resp failure - Due to pneumonia/sepsis and asthma exacerbation -Improving, Weaned off BiPAP -wean O2  2. Suspected community-acquired pneumonia -Currently on broad-spectrum antibiotics -Changed to oral Levaquin tomorrow if cultures stay negative, since WBC went up significantly will leave him on this -lactic acid mildly elevated but clinically improving -repeat CXR today  3. Asthma exacerbation  -antibiotics as above, IV Solu-Medrol  -Duonebs and albuterol as needed   4. Seizure disorder -Continue Keppra  5. Paroxysmal atrial fibrillation -In normal sinus rhythm, continue amiodarone and aspirin, reportedly not not on Coumadin due to history of seizure/fall risk   6. Anemia of chronic disease -stable  DVT proph: lovenox  Code Status: Full Code Family Communication: none at bedside Disposition Plan: tx to floor later today  HPI/Subjective: Breathing better  Objective: Filed Vitals:   11/29/15 0400 11/29/15 0800  BP: 127/68 146/75  Pulse: 65 70  Temp: 97.7 F (36.5 C) 97.4 F (36.3 C)  Resp: 22 17    Intake/Output Summary (Last 24 hours) at 11/29/15 1146 Last data filed at 11/29/15 1110  Gross per 24 hour  Intake 1688.33 ml  Output   1825 ml  Net -136.67 ml   Filed Weights   11/27/15 2014 11/28/15 0225  Weight: 111.131 kg (245 lb) 111.3 kg (245 lb 6 oz)    Exam:   General: AAOx3, off BIPAP  Cardiovascular: S1S2/RRR  Respiratory: CTAB  Abdomen: soft, NT, BS present  Musculoskeletal: no edema    Data Reviewed: Basic Metabolic Panel:  Recent Labs Lab 11/27/15 1911 11/28/15 0355 11/29/15 0247  NA 134* 135 138  K 3.8 4.2 4.7  CL 100* 104 107  CO2 25 23 25   GLUCOSE 130* 183* 135*  BUN 10 11 15   CREATININE 1.28* 1.33* 1.29*  CALCIUM 9.2 8.6* 8.7*   Liver Function Tests: No results for  input(s): AST, ALT, ALKPHOS, BILITOT, PROT, ALBUMIN in the last 168 hours.  Recent Labs Lab 11/27/15 2015  LIPASE 32   No results for input(s): AMMONIA in the last 168 hours. CBC:  Recent Labs Lab 11/24/15 1052 11/27/15 1911 11/28/15 0355 11/29/15 0247  WBC 9.9 15.8* 16.7* 29.6*  NEUTROABS 5.8  --   --   --   HGB 11.6* 11.7* 10.5* 10.3*  HCT 38.0* 37.7* 34.5* 32.7*  MCV 68.3 Repeated and verified X2.* 67.8* 68.2* 67.7*  PLT 342.0 368 319 365   Cardiac Enzymes: No results for input(s): CKTOTAL, CKMB, CKMBINDEX, TROPONINI in the last 168 hours. BNP (last 3 results)  Recent Labs  11/27/15 1911  BNP 18.7    ProBNP (last 3 results) No results for input(s): PROBNP in the last 8760 hours.  CBG: No results for input(s): GLUCAP in the last 168 hours.  Recent Results (from the past 240 hour(s))  Blood Culture (routine x 2)     Status: None (Preliminary result)   Collection Time: 11/27/15  8:10 PM  Result Value Ref Range Status   Specimen Description BLOOD LEFT HAND  Final   Special Requests BOTTLES DRAWN AEROBIC AND ANAEROBIC 5CC  Final   Culture NO GROWTH < 24 HOURS  Final   Report Status PENDING  Incomplete  Urine culture     Status: None (Preliminary result)   Collection Time: 11/27/15  8:13 PM  Result Value Ref Range Status   Specimen Description URINE, CLEAN CATCH  Final   Special Requests  NONE  Final   Culture NO GROWTH < 24 HOURS  Final   Report Status PENDING  Incomplete  MRSA PCR Screening     Status: Abnormal   Collection Time: 11/28/15  2:42 AM  Result Value Ref Range Status   MRSA by PCR POSITIVE (A) NEGATIVE Final    Comment:        The GeneXpert MRSA Assay (FDA approved for NASAL specimens only), is one component of a comprehensive MRSA colonization surveillance program. It is not intended to diagnose MRSA infection nor to guide or monitor treatment for MRSA infections. RESULT CALLED TO, READ BACK BY AND VERIFIED WITHVal Riles RN [id number] AT  [id number] SKEEN,P      Studies: Dg Chest 2 View  11/29/2015  CLINICAL DATA:  Shortness of breath. EXAM: CHEST  2 VIEW COMPARISON:  11/27/2015. FINDINGS: Mediastinum and hilar structures normal. Cardiomegaly. Mild pulmonary venous congestion. Low lung volumes with bibasilar atelectasis. Small pleural effusions. No pneumothorax. IMPRESSION: 1. Omega with mild pulmonary vascular prominence. Small bilateral pleural effusions. Mild component congestive heart failure cannot be excluded. 2. Bibasilar subsegmental atelectasis and/or mild infiltrates Electronically Signed   By: [place]   On: 11/29/2015 08:14   Dg Chest Port 1 View  11/27/2015  CLINICAL DATA:  Shortness of breath for the past 3-4 hours. EXAM: PORTABLE CHEST 1 VIEW COMPARISON:  01/24/2014. FINDINGS: Poor inspiration with a significantly decreased inspiration compared to the previous examination. This is magnifying the cardiac silhouette. Taking this and patient rotation to the right into account, there has probably been no significant change in a normal sized heart. The aorta is tortuous. The pulmonary vasculature is crowded by the poor inspiration. There is minimal ill-defined opacity at the left lateral lung base. Unremarkable bones. IMPRESSION: Very poor inspiration with minimal probable atelectasis at the left lung base. Electronically Signed   By: Claudie Revering M.D.   On: 11/27/2015 20:08    Scheduled Meds: . amiodarone  200 mg Oral Daily  . antiseptic oral rinse  7 mL Mouth Rinse BID  . Chlorhexidine Gluconate Cloth  6 each Topical Q0600  . enoxaparin (LOVENOX) injection  40 mg Subcutaneous Q24H  . ipratropium-albuterol  3 mL Nebulization Q4H  . levETIRAcetam  1,500 mg Oral BID  . methylPREDNISolone (SOLU-MEDROL) injection  60 mg Intravenous Q12H  . mupirocin ointment  1 application Nasal BID  . pantoprazole  40 mg Oral Daily  . piperacillin-tazobactam (ZOSYN)  IV  3.375 g Intravenous Q8H  . sodium chloride  3 mL Intravenous  Q12H  . vancomycin  750 mg Intravenous Q12H   Continuous Infusions: . sodium chloride Stopped (11/29/15 0750)  . albuterol Stopped (11/27/15 2232)   Antibiotics Given (last 72 hours)    Date/Time Action Medication Dose Rate   11/28/15 0457 Given   piperacillin-tazobactam (ZOSYN) IVPB 3.375 g 3.375 g 12.5 mL/hr   11/28/15 0933 Given   vancomycin (VANCOCIN) IVPB 750 mg/150 ml premix 750 mg 150 mL/hr   11/28/15 1302 Given   piperacillin-tazobactam (ZOSYN) IVPB 3.375 g 3.375 g 12.5 mL/hr   11/28/15 1955 Given   vancomycin (VANCOCIN) IVPB 750 mg/150 ml premix 750 mg 150 mL/hr   11/28/15 1958 Given   piperacillin-tazobactam (ZOSYN) IVPB 3.375 g 3.375 g 12.5 mL/hr   11/29/15 0354 Given   piperacillin-tazobactam (ZOSYN) IVPB 3.375 g 3.375 g 12.5 mL/hr   11/29/15 [id number] Given   vancomycin (VANCOCIN) IVPB 750 mg/150 ml premix 750 mg 150 mL/hr      Principal Problem:  Sepsis due to pneumonia [facility]) Active Problems:   Essential hypertension, benign   Atrial fibrillation (HCC)   Acute respiratory failure ([place])   Asthma exacerbation   Anemia   CHF (congestive heart failure) ([place])   Sepsis ([place])    Time spent: 51min    Ronrico Dupin  [facility] Pager [phone]. If 7PM-7AM, please contact night-coverage at [email or web address], password [facility] 11/29/2015, 11:46 AM  LOS: 2 days

## 2015-11-29 NOTE — Clinical Documentation Improvement (Signed)
Internal Medicine    Document any associated diagnoses/conditions   Supporting Information: States in medical record - history of CHF and monitor for fluid overload.  2D echo ordered.   Please exercise your independent, professional judgment when responding. A specific answer is not anticipated or expected.   Thank You,  [place] [phone]

## 2015-11-30 ENCOUNTER — Inpatient Hospital Stay (HOSPITAL_COMMUNITY): Payer: Commercial Managed Care - HMO

## 2015-11-30 DIAGNOSIS — R0600 Dyspnea, unspecified: Secondary | ICD-10-CM

## 2015-11-30 LAB — CBC
HEMATOCRIT: 35.5 % — AB (ref 39.0–52.0)
HEMOGLOBIN: 10.7 g/dL — AB (ref 13.0–17.0)
MCH: 20.5 pg — AB (ref 26.0–34.0)
MCHC: 30.1 g/dL (ref 30.0–36.0)
MCV: 67.9 fL — ABNORMAL LOW (ref 78.0–100.0)
Platelets: 434 10*3/uL — ABNORMAL HIGH (ref 150–400)
RBC: 5.23 MIL/uL (ref 4.22–5.81)
RDW: 16.6 % — AB (ref 11.5–15.5)
WBC: 22.7 10*3/uL — ABNORMAL HIGH (ref 4.0–10.5)

## 2015-11-30 LAB — BASIC METABOLIC PANEL
ANION GAP: 10 (ref 5–15)
BUN: 17 mg/dL (ref 6–20)
CO2: 24 mmol/L (ref 22–32)
Calcium: 8.9 mg/dL (ref 8.9–10.3)
Chloride: 105 mmol/L (ref 101–111)
Creatinine, Ser: 1.27 mg/dL — ABNORMAL HIGH (ref 0.61–1.24)
GFR calc Af Amer: >60 mL/min (ref >60)
GFR, EST NON AFRICAN AMERICAN: 57 mL/min — AB (ref >60)
GLUCOSE: 143 mg/dL — AB (ref 65–99)
POTASSIUM: 4.1 mmol/L (ref 3.5–5.1)
Sodium: 139 mmol/L (ref 135–145)

## 2015-11-30 MED ORDER — POTASSIUM CHLORIDE CRYS ER 20 MEQ PO TBCR
40.0000 meq | EXTENDED_RELEASE_TABLET | Freq: Once | ORAL | Status: AC
  Administered 2015-11-30: 40 meq via ORAL
  Filled 2015-11-30: qty 2

## 2015-11-30 MED ORDER — PREDNISONE 20 MG PO TABS
40.0000 mg | ORAL_TABLET | Freq: Every day | ORAL | Status: DC
  Administered 2015-11-30 – 2015-12-01 (×2): 40 mg via ORAL
  Filled 2015-11-30 (×2): qty 2

## 2015-11-30 MED ORDER — ASPIRIN EC 81 MG PO TBEC
81.0000 mg | DELAYED_RELEASE_TABLET | Freq: Every day | ORAL | Status: DC
  Administered 2015-11-30 – 2015-12-02 (×3): 81 mg via ORAL
  Filled 2015-11-30 (×3): qty 1

## 2015-11-30 MED ORDER — FUROSEMIDE 10 MG/ML IJ SOLN
40.0000 mg | Freq: Two times a day (BID) | INTRAMUSCULAR | Status: AC
  Administered 2015-11-30 (×2): 40 mg via INTRAVENOUS
  Filled 2015-11-30 (×2): qty 4

## 2015-11-30 MED ORDER — POLYETHYLENE GLYCOL 3350 17 G PO PACK
17.0000 g | PACK | Freq: Every day | ORAL | Status: DC | PRN
  Filled 2015-11-30: qty 1

## 2015-11-30 MED ORDER — LEVOFLOXACIN 500 MG PO TABS
750.0000 mg | ORAL_TABLET | Freq: Every day | ORAL | Status: DC
  Administered 2015-11-30 – 2015-12-02 (×3): 750 mg via ORAL
  Filled 2015-11-30 (×3): qty 2

## 2015-11-30 NOTE — Progress Notes (Signed)
  Echocardiogram 2D Echocardiogram has been performed.  Jennette Dubin 11/30/2015, 2:43 PM

## 2015-11-30 NOTE — Progress Notes (Signed)
[facility] PROGRESS NOTE  [name] [id number] DOB: 03-26-1951 DOA: 11/27/2015 PCP: Lorayne Marek, MD   HPI: [name] [name] is a 64 y.o. male with a history of Asthma, Diastolic CHF, HTN, Atrial Fibrillation who presented to the ED with complaints of increased SOB and Cough and Fevers and Chills x 4 days.   He also had increased wheezing. He was found to have a fever to 100.5, and was tachycardic to the 130's, and tachypneic to the 40s. He was placed on BiPAP, and administered Nebs, and a sepsis workup was initiated Resp status improving on Abx, steroids, nebs Repeat Cxr with interstitial edema, giving lasix and ordered ECHO  Assessment/Plan: 1. Acute Resp failure - Due to ?sepsis/asthma exacerbation -Improving, Weaned off BiPAP -wean O2 -stop solumedrol, quick prednisone taper, duonebs and albuterol -component of CHF too-lasix today  2. Sepsis/Bronchitis/Suspected community-acquired pneumonia -started on broad-spectrum antibiotics on admission -Changed to oral Levaquin today, cultures negative, suspect leukocytosis is primarily steroid related -remains afebrile -lactic acid was mildly elevated but clinically improving  3. Acute CHF /suspect Diastolic --repeat CXR with some interstitial edema -Iv lasix x2 today -check ECHo, follow I/Os  4. Seizure disorder -Continue Keppra  5. Paroxysmal atrial fibrillation -In normal sinus rhythm, continue amiodarone and aspirin, reportedly not not on Coumadin due to history of seizure/fall risk   6. Anemia of chronic disease -stable  DVT proph: lovenox  Code Status: Full Code Family Communication: none at bedside Disposition Plan: home in 1-2days  HPI/Subjective: Breathing better, wheezing and cough improving  Objective: Filed Vitals:   11/30/15 0512 11/30/15 0907  BP: 142/66 164/94  Pulse: 77 74  Temp: 97.6 F (36.4 C) 97.9 F (36.6 C)  Resp: 17 18    Intake/Output Summary (Last 24 hours) at 11/30/15  1444 Last data filed at 11/30/15 1300  Gross per 24 hour  Intake   1050 ml  Output   2150 ml  Net  -1100 ml   Filed Weights   11/27/15 2014 11/28/15 0225 11/29/15 2029  Weight: 111.131 kg (245 lb) 111.3 kg (245 lb 6 oz) 112.7 kg (248 lb 7.3 oz)    Exam:   General: AAOx3, no distress  Cardiovascular: S1S2/RRR  Respiratory: fine basilar crackles today  Abdomen: soft, NT, BS present  Musculoskeletal: trace edema    Data Reviewed: Basic Metabolic Panel:  Recent Labs Lab 11/27/15 1911 11/28/15 0355 11/29/15 0247 11/30/15 0625  NA 134* 135 138 139  K 3.8 4.2 4.7 4.1  CL 100* 104 107 105  CO2 25 23 25 24   GLUCOSE 130* 183* 135* 143*  BUN 10 11 15 17   CREATININE 1.28* 1.33* 1.29* 1.27*  CALCIUM 9.2 8.6* 8.7* 8.9   Liver Function Tests: No results for input(s): AST, ALT, ALKPHOS, BILITOT, PROT, ALBUMIN in the last 168 hours.  Recent Labs Lab 11/27/15 2015  LIPASE 32   No results for input(s): AMMONIA in the last 168 hours. CBC:  Recent Labs Lab 11/24/15 1052 11/27/15 1911 11/28/15 0355 11/29/15 0247 11/30/15 0625  WBC 9.9 15.8* 16.7* 29.6* 22.7*  NEUTROABS 5.8  --   --   --   --   HGB 11.6* 11.7* 10.5* 10.3* 10.7*  HCT 38.0* 37.7* 34.5* 32.7* 35.5*  MCV 68.3 Repeated and verified X2.* 67.8* 68.2* 67.7* 67.9*  PLT 342.0 368 319 365 434*   Cardiac Enzymes: No results for input(s): CKTOTAL, CKMB, CKMBINDEX, TROPONINI in the last 168 hours. BNP (last 3 results)  Recent Labs  11/27/15 1911  BNP 18.7    ProBNP (last 3 results) No results for input(s): PROBNP in the last 8760 hours.  CBG: No results for input(s): GLUCAP in the last 168 hours.  Recent Results (from the past 240 hour(s))  Blood Culture (routine x 2)     Status: None (Preliminary result)   Collection Time: 11/27/15  8:10 PM  Result Value Ref Range Status   Specimen Description BLOOD LEFT HAND  Final   Special Requests BOTTLES DRAWN AEROBIC AND ANAEROBIC 5CC  Final   Culture NO  GROWTH 3 DAYS  Final   Report Status PENDING  Incomplete  Urine culture     Status: None   Collection Time: 11/27/15  8:13 PM  Result Value Ref Range Status   Specimen Description URINE, CLEAN CATCH  Final   Special Requests NONE  Final   Culture MULTIPLE SPECIES PRESENT, SUGGEST RECOLLECTION  Final   Report Status 11/29/2015 FINAL  Final  Blood Culture (routine x 2)     Status: None (Preliminary result)   Collection Time: 11/27/15 11:40 PM  Result Value Ref Range Status   Specimen Description BLOOD RIGHT WRIST  Final   Special Requests IN PEDIATRIC BOTTLE 5CC  POS ON ZOSYN AND VANCO  Final   Culture NO GROWTH 2 DAYS  Final   Report Status PENDING  Incomplete  MRSA PCR Screening     Status: Abnormal   Collection Time: 11/28/15  2:42 AM  Result Value Ref Range Status   MRSA by PCR POSITIVE (A) NEGATIVE Final    Comment:        The GeneXpert MRSA Assay (FDA approved for NASAL specimens only), is one component of a comprehensive MRSA colonization surveillance program. It is not intended to diagnose MRSA infection nor to guide or monitor treatment for MRSA infections. RESULT CALLED TO, READ BACK BY AND VERIFIED WITHVal Riles RN [id number] AT [id number] SKEEN,P      Studies: Dg Chest 2 View  11/29/2015  CLINICAL DATA:  Shortness of breath. EXAM: CHEST  2 VIEW COMPARISON:  11/27/2015. FINDINGS: Mediastinum and hilar structures normal. Cardiomegaly. Mild pulmonary venous congestion. Low lung volumes with bibasilar atelectasis. Small pleural effusions. No pneumothorax. IMPRESSION: 1. Omega with mild pulmonary vascular prominence. Small bilateral pleural effusions. Mild component congestive heart failure cannot be excluded. 2. Bibasilar subsegmental atelectasis and/or mild infiltrates Electronically Signed   By: [place]   On: 11/29/2015 08:14    Scheduled Meds: . amiodarone  200 mg Oral Daily  . antiseptic oral rinse  7 mL Mouth Rinse BID  . aspirin EC  81 mg Oral Daily  .  Chlorhexidine Gluconate Cloth  6 each Topical Q0600  . enoxaparin (LOVENOX) injection  40 mg Subcutaneous Q24H  . furosemide  40 mg Intravenous Q12H  . ipratropium-albuterol  3 mL Nebulization TID  . levETIRAcetam  1,500 mg Oral BID  . levofloxacin  750 mg Oral Daily  . mupirocin ointment  1 application Nasal BID  . pantoprazole  40 mg Oral Daily  . predniSONE  40 mg Oral Q breakfast  . sodium chloride  3 mL Intravenous Q12H   Continuous Infusions: . albuterol Stopped (11/27/15 2232)   Antibiotics Given (last 72 hours)    Date/Time Action Medication Dose Rate   11/28/15 0457 Given   piperacillin-tazobactam (ZOSYN) IVPB 3.375 g 3.375 g 12.5 mL/hr   11/28/15 0933 Given   vancomycin (VANCOCIN) IVPB 750 mg/150 ml premix 750 mg 150 mL/hr   11/28/15 1302 Given  piperacillin-tazobactam (ZOSYN) IVPB 3.375 g 3.375 g 12.5 mL/hr   11/28/15 1955 Given   vancomycin (VANCOCIN) IVPB 750 mg/150 ml premix 750 mg 150 mL/hr   11/28/15 1958 Given   piperacillin-tazobactam (ZOSYN) IVPB 3.375 g 3.375 g 12.5 mL/hr   11/29/15 0354 Given   piperacillin-tazobactam (ZOSYN) IVPB 3.375 g 3.375 g 12.5 mL/hr   11/29/15 [id number] Given   vancomycin (VANCOCIN) IVPB 750 mg/150 ml premix 750 mg 150 mL/hr   11/29/15 1413 Given   piperacillin-tazobactam (ZOSYN) IVPB 3.375 g 3.375 g 12.5 mL/hr   11/29/15 2041 Given   vancomycin (VANCOCIN) IVPB 750 mg/150 ml premix 750 mg 150 mL/hr   11/29/15 2209 Given   piperacillin-tazobactam (ZOSYN) IVPB 3.375 g 3.375 g 12.5 mL/hr   11/30/15 0451 Given   piperacillin-tazobactam (ZOSYN) IVPB 3.375 g 3.375 g 12.5 mL/hr   11/30/15 1102 Given   levofloxacin (LEVAQUIN) tablet 750 mg 750 mg       Principal Problem:   Sepsis due to pneumonia [facility]) Active Problems:   Essential hypertension, benign   Atrial fibrillation ([place])   Acute respiratory failure ([place])   Asthma exacerbation   Anemia   CHF (congestive heart failure) ([place])   Sepsis ([place])    Time spent:  52min    Maryanne Huneycutt  [facility] Pager [phone]. If 7PM-7AM, please contact night-coverage at [email or web address], password [facility] 11/30/2015, 2:44 PM  LOS: 3 days

## 2015-11-30 NOTE — Consult Note (Signed)
   [facility] CM Inpatient Consult   11/30/2015  ALPHONZO SAAH 18-Feb-1951 [id number]   Came to visit patient to discuss and offer [facility] services. Consents signed. Explained that he will receive post hospital discharge calls and will receive monthly home visits. He lives alone. Denies having issues with affording medications or with transportation. However, he reports he does not have scale and cannot afford one. He endorses he goes to [employer] as well. Will request for patient to be assigned to [facility] for disease and symptom and management for CHF. Made inpatient RNCM aware of the above.   Marthenia Rolling, MSN-Ed, RN,BSN [facility] Liaison [phone]

## 2015-12-01 ENCOUNTER — Inpatient Hospital Stay (HOSPITAL_COMMUNITY): Payer: Commercial Managed Care - HMO

## 2015-12-01 DIAGNOSIS — J9600 Acute respiratory failure, unspecified whether with hypoxia or hypercapnia: Secondary | ICD-10-CM

## 2015-12-01 DIAGNOSIS — I10 Essential (primary) hypertension: Secondary | ICD-10-CM

## 2015-12-01 LAB — BASIC METABOLIC PANEL
ANION GAP: 12 (ref 5–15)
BUN: 22 mg/dL — ABNORMAL HIGH (ref 6–20)
CALCIUM: 8.9 mg/dL (ref 8.9–10.3)
CO2: 25 mmol/L (ref 22–32)
Chloride: 104 mmol/L (ref 101–111)
Creatinine, Ser: 1.22 mg/dL (ref 0.61–1.24)
GLUCOSE: 105 mg/dL — AB (ref 65–99)
POTASSIUM: 4.2 mmol/L (ref 3.5–5.1)
SODIUM: 141 mmol/L (ref 135–145)

## 2015-12-01 LAB — CBC
HCT: 35.8 % — ABNORMAL LOW (ref 39.0–52.0)
HEMOGLOBIN: 11.2 g/dL — AB (ref 13.0–17.0)
MCH: 21.1 pg — ABNORMAL LOW (ref 26.0–34.0)
MCHC: 31.3 g/dL (ref 30.0–36.0)
MCV: 67.4 fL — ABNORMAL LOW (ref 78.0–100.0)
Platelets: 441 10*3/uL — ABNORMAL HIGH (ref 150–400)
RBC: 5.31 MIL/uL (ref 4.22–5.81)
RDW: 16.3 % — AB (ref 11.5–15.5)
WBC: 20.2 10*3/uL — AB (ref 4.0–10.5)

## 2015-12-01 LAB — URINE MICROSCOPIC-ADD ON

## 2015-12-01 LAB — URINALYSIS, ROUTINE W REFLEX MICROSCOPIC
BILIRUBIN URINE: NEGATIVE
Glucose, UA: NEGATIVE mg/dL
KETONES UR: NEGATIVE mg/dL
LEUKOCYTES UA: NEGATIVE
NITRITE: NEGATIVE
Protein, ur: NEGATIVE mg/dL
SPECIFIC GRAVITY, URINE: 1.022 (ref 1.005–1.030)
pH: 6 (ref 5.0–8.0)

## 2015-12-01 MED ORDER — PREDNISONE 20 MG PO TABS
20.0000 mg | ORAL_TABLET | Freq: Every day | ORAL | Status: DC
  Administered 2015-12-02: 20 mg via ORAL
  Filled 2015-12-01: qty 1

## 2015-12-01 MED ORDER — FUROSEMIDE 10 MG/ML IJ SOLN
40.0000 mg | Freq: Two times a day (BID) | INTRAMUSCULAR | Status: DC
  Administered 2015-12-01: 40 mg via INTRAVENOUS
  Filled 2015-12-01: qty 4

## 2015-12-01 MED ORDER — HYDRALAZINE HCL 20 MG/ML IJ SOLN
10.0000 mg | Freq: Four times a day (QID) | INTRAMUSCULAR | Status: DC | PRN
  Administered 2015-12-01: 10 mg via INTRAVENOUS
  Filled 2015-12-01: qty 1

## 2015-12-01 NOTE — Progress Notes (Signed)
[facility] PROGRESS NOTE  [name] [name] [id number] DOB: 28-Apr-1951 DOA: 11/27/2015 PCP: Lorayne Marek, MD   HPI: [name] [name] is a 65 y.o. male with a history of Asthma, Diastolic CHF, HTN, Atrial Fibrillation who presented to the ED with complaints of increased SOB and Cough and Fevers and Chills x 4 days.   He also had increased wheezing. He was found to have a fever to 100.5, and was tachycardic to the 130's, and tachypneic to the 40s. He was placed on BiPAP, and administered Nebs, and a sepsis workup was initiated Resp status improving on Abx, steroids, nebs Repeat Cxr with interstitial edema, giving lasix and ordered ECHO  Assessment/Plan: 1. Acute Resp failure - Due to ?sepsis/asthma exacerbation -Improving, Weaned off BiPAP -wean O2 -stop solumedrol, quick prednisone taper, duonebs and albuterol -component of CHF too, 3 doses of lasix given so far.   2. Sepsis/Bronchitis/Suspected community-acquired pneumonia -started on broad-spectrum antibiotics on admission -Changed to oral Levaquin today, cultures negative, suspect leukocytosis is primarily steroid related -remains afebrile -lactic acid was mildly elevated but clinically improving  3. Acute CHF /suspect Diastolic --repeat CXR with some interstitial edema,  -ECHO shows good LVEF, diastolic function parameters are normal.   4. Seizure disorder -Continue Keppra  5. Paroxysmal atrial fibrillation -In normal sinus rhythm, continue amiodarone and aspirin, reportedly not not on Coumadin due to history of seizure/fall risk   6. Anemia of chronic disease -stable  DVT proph: lovenox  Code Status: Full Code Family Communication: none at bedside Disposition Plan: home in 1-2days  HPI/Subjective: Breathing better.   Objective: Filed Vitals:   12/01/15 1730 12/01/15 1849  BP: 159/104 141/66  Pulse: 66   Temp: 98 F (36.7 C)   Resp: 18     Intake/Output Summary (Last 24 hours) at 12/01/15  1942 Last data filed at 12/01/15 1850  Gross per 24 hour  Intake    920 ml  Output   1655 ml  Net   -735 ml   Filed Weights   11/28/15 0225 11/29/15 2029 11/30/15 2142  Weight: 111.3 kg (245 lb 6 oz) 112.7 kg (248 lb 7.3 oz) 112.43 kg (247 lb 13.8 oz)    Exam:   General: AAOx3, no distress  Cardiovascular: S1S2/RRR  Respiratory: fine basilar crackles today  Abdomen: soft, NT, BS present  Musculoskeletal: trace edema    Data Reviewed: Basic Metabolic Panel:  Recent Labs Lab 11/27/15 1911 11/28/15 0355 11/29/15 0247 11/30/15 0625 12/01/15 0304  NA 134* 135 138 139 141  K 3.8 4.2 4.7 4.1 4.2  CL 100* 104 107 105 104  CO2 25 23 25 24 25   GLUCOSE 130* 183* 135* 143* 105*  BUN 10 11 15 17  22*  CREATININE 1.28* 1.33* 1.29* 1.27* 1.22  CALCIUM 9.2 8.6* 8.7* 8.9 8.9   Liver Function Tests: No results for input(s): AST, ALT, ALKPHOS, BILITOT, PROT, ALBUMIN in the last 168 hours.  Recent Labs Lab 11/27/15 2015  LIPASE 32   No results for input(s): AMMONIA in the last 168 hours. CBC:  Recent Labs Lab 11/27/15 1911 11/28/15 0355 11/29/15 0247 11/30/15 0625 12/01/15 0304  WBC 15.8* 16.7* 29.6* 22.7* 20.2*  HGB 11.7* 10.5* 10.3* 10.7* 11.2*  HCT 37.7* 34.5* 32.7* 35.5* 35.8*  MCV 67.8* 68.2* 67.7* 67.9* 67.4*  PLT 368 319 365 434* 441*   Cardiac Enzymes: No results for input(s): CKTOTAL, CKMB, CKMBINDEX, TROPONINI in the last 168 hours. BNP (last 3 results)  Recent Labs  11/27/15 1911  BNP 18.7    ProBNP (last 3 results) No results for input(s): PROBNP in the last 8760 hours.  CBG: No results for input(s): GLUCAP in the last 168 hours.  Recent Results (from the past 240 hour(s))  Blood Culture (routine x 2)     Status: None (Preliminary result)   Collection Time: 11/27/15  8:10 PM  Result Value Ref Range Status   Specimen Description BLOOD LEFT HAND  Final   Special Requests BOTTLES DRAWN AEROBIC AND ANAEROBIC 5CC  Final   Culture NO GROWTH 4  DAYS  Final   Report Status PENDING  Incomplete  Urine culture     Status: None   Collection Time: 11/27/15  8:13 PM  Result Value Ref Range Status   Specimen Description URINE, CLEAN CATCH  Final   Special Requests NONE  Final   Culture MULTIPLE SPECIES PRESENT, SUGGEST RECOLLECTION  Final   Report Status 11/29/2015 FINAL  Final  Blood Culture (routine x 2)     Status: None (Preliminary result)   Collection Time: 11/27/15 11:40 PM  Result Value Ref Range Status   Specimen Description BLOOD RIGHT WRIST  Final   Special Requests IN PEDIATRIC BOTTLE 5CC  POS ON ZOSYN AND VANCO  Final   Culture NO GROWTH 3 DAYS  Final   Report Status PENDING  Incomplete  MRSA PCR Screening     Status: Abnormal   Collection Time: 11/28/15  2:42 AM  Result Value Ref Range Status   MRSA by PCR POSITIVE (A) NEGATIVE Final    Comment:        The GeneXpert MRSA Assay (FDA approved for NASAL specimens only), is one component of a comprehensive MRSA colonization surveillance program. It is not intended to diagnose MRSA infection nor to guide or monitor treatment for MRSA infections. RESULT CALLED TO, READ BACK BY AND VERIFIED WITHVal Riles RN [id number] AT [phone] SKEEN,P      Studies: Dg Chest Port 1 View  12/01/2015  CLINICAL DATA:  Follow-up EXAM: PORTABLE CHEST 1 VIEW COMPARISON:  11/29/2015 FINDINGS: Low lung volumes with lingular and bilateral lower lobe atelectasis. Small left pleural effusion. No frank interstitial edema. No pneumothorax. Cardiomegaly. IMPRESSION: Low lung volumes with lingular and bilateral lower lobe atelectasis. Small left pleural effusion. Electronically Signed   By: Julian Hy M.D.   On: 12/01/2015 12:00    Scheduled Meds: . amiodarone  200 mg Oral Daily  . antiseptic oral rinse  7 mL Mouth Rinse BID  . aspirin EC  81 mg Oral Daily  . Chlorhexidine Gluconate Cloth  6 each Topical Q0600  . enoxaparin (LOVENOX) injection  40 mg Subcutaneous Q24H  . furosemide  40 mg  Intravenous Q12H  . ipratropium-albuterol  3 mL Nebulization TID  . levETIRAcetam  1,500 mg Oral BID  . levofloxacin  750 mg Oral Daily  . mupirocin ointment  1 application Nasal BID  . pantoprazole  40 mg Oral Daily  . predniSONE  40 mg Oral Q breakfast  . sodium chloride  3 mL Intravenous Q12H   Continuous Infusions: . albuterol Stopped (11/27/15 2232)   Antibiotics Given (last 72 hours)    Date/Time Action Medication Dose Rate   11/28/15 1955 Given   vancomycin (VANCOCIN) IVPB 750 mg/150 ml premix 750 mg 150 mL/hr   11/28/15 1958 Given   piperacillin-tazobactam (ZOSYN) IVPB 3.375 g 3.375 g 12.5 mL/hr   11/29/15 0354 Given   piperacillin-tazobactam (ZOSYN) IVPB 3.375 g 3.375 g 12.5 mL/hr   11/29/15  [id number] Given   vancomycin (VANCOCIN) IVPB 750 mg/150 ml premix 750 mg 150 mL/hr   11/29/15 1413 Given   piperacillin-tazobactam (ZOSYN) IVPB 3.375 g 3.375 g 12.5 mL/hr   11/29/15 2041 Given   vancomycin (VANCOCIN) IVPB 750 mg/150 ml premix 750 mg 150 mL/hr   11/29/15 2209 Given   piperacillin-tazobactam (ZOSYN) IVPB 3.375 g 3.375 g 12.5 mL/hr   11/30/15 0451 Given   piperacillin-tazobactam (ZOSYN) IVPB 3.375 g 3.375 g 12.5 mL/hr   11/30/15 1102 Given   levofloxacin (LEVAQUIN) tablet 750 mg 750 mg    12/01/15 0856 Given   levofloxacin (LEVAQUIN) tablet 750 mg 750 mg       Principal Problem:   Sepsis due to pneumonia [facility]) Active Problems:   Essential hypertension, benign   Atrial fibrillation ([facility])   Acute respiratory failure ([facility])   Asthma exacerbation   Anemia   CHF (congestive heart failure) ([place])   Sepsis ([place])    Time spent: 74min    [place] No  [facility] Pager [phone] If 7PM-7AM, please contact night-coverage at [email or web address], password [facility] 12/01/2015, 7:42 PM  LOS: 4 days

## 2015-12-01 NOTE — Progress Notes (Signed)
Instructed pt to use urinal  For accurate  Urine outputs, not to flush but call us instead for measurements

## 2015-12-02 DIAGNOSIS — J189 Pneumonia, unspecified organism: Secondary | ICD-10-CM

## 2015-12-02 DIAGNOSIS — A419 Sepsis, unspecified organism: Principal | ICD-10-CM

## 2015-12-02 LAB — URINALYSIS, ROUTINE W REFLEX MICROSCOPIC
BILIRUBIN URINE: NEGATIVE
Glucose, UA: NEGATIVE mg/dL
HGB URINE DIPSTICK: NEGATIVE
KETONES UR: NEGATIVE mg/dL
Leukocytes, UA: NEGATIVE
NITRITE: NEGATIVE
Protein, ur: NEGATIVE mg/dL
Specific Gravity, Urine: 1.025 (ref 1.005–1.030)
pH: 5.5 (ref 5.0–8.0)

## 2015-12-02 LAB — CBC
HCT: 37.9 % — ABNORMAL LOW (ref 39.0–52.0)
HEMOGLOBIN: 12 g/dL — AB (ref 13.0–17.0)
MCH: 21.2 pg — AB (ref 26.0–34.0)
MCHC: 31.7 g/dL (ref 30.0–36.0)
MCV: 66.8 fL — ABNORMAL LOW (ref 78.0–100.0)
PLATELETS: 479 10*3/uL — AB (ref 150–400)
RBC: 5.67 MIL/uL (ref 4.22–5.81)
RDW: 16.2 % — AB (ref 11.5–15.5)
WBC: 17.2 10*3/uL — ABNORMAL HIGH (ref 4.0–10.5)

## 2015-12-02 LAB — CULTURE, BLOOD (ROUTINE X 2): CULTURE: NO GROWTH

## 2015-12-02 MED ORDER — LEVOFLOXACIN 750 MG PO TABS: 750.0000 mg | ORAL_TABLET | Freq: Every day | ORAL | Status: DC

## 2015-12-02 MED ORDER — PREDNISONE 20 MG PO TABS: 20.0000 mg | ORAL_TABLET | Freq: Every day | ORAL | Status: DC

## 2015-12-02 MED ORDER — ALBUTEROL SULFATE (2.5 MG/3ML) 0.083% IN NEBU: 2.5000 mg | INHALATION_SOLUTION | RESPIRATORY_TRACT | Status: DC | PRN

## 2015-12-02 NOTE — Progress Notes (Signed)
[name] [name] to be D/C'd Home per MD order.  Discussed prescriptions and follow up appointments with the patient. Prescriptions given to patient, medication list explained in detail. Pt verbalized understanding.    Medication List    STOP taking these medications        pantoprazole 40 MG tablet  Commonly known as:  PROTONIX      TAKE these medications        albuterol 108 (90 BASE) MCG/ACT inhaler  Commonly known as:  PROVENTIL HFA;VENTOLIN HFA  Inhale 2 puffs into the lungs every 6 (six) hours as needed.     albuterol (2.5 MG/3ML) 0.083% nebulizer solution  Commonly known as:  PROVENTIL  Take 3 mLs (2.5 mg total) by nebulization every 4 (four) hours as needed for wheezing or shortness of breath.     amiodarone 200 MG tablet  Commonly known as:  PACERONE  Take 1 tablet (200 mg total) by mouth daily.     aspirin EC 81 MG tablet  Take 81 mg by mouth daily.     clomiPHENE 50 MG tablet  Commonly known as:  CLOMID  1/2 tab daily.     clotrimazole-betamethasone cream  Commonly known as:  LOTRISONE  Apply 1 application topically 2 (two) times daily.     ferrous sulfate 325 (65 FE) MG tablet  Take 325 mg by mouth daily with breakfast.     fluticasone-salmeterol 115-21 MCG/ACT inhaler  Commonly known as:  ADVAIR HFA  Inhale 2 puffs into the lungs daily as needed. For shortness of breath     levETIRAcetam 750 MG tablet  Commonly known as:  KEPPRA  Take 2 tablets (1,500 mg total) by mouth 2 (two) times daily.     levofloxacin 750 MG tablet  Commonly known as:  LEVAQUIN  Take 1 tablet (750 mg total) by mouth daily.     lisinopril 20 MG tablet  Commonly known as:  PRINIVIL,ZESTRIL  Take 20 mg by mouth daily.     metoCLOPramide 5 MG tablet  Commonly known as:  REGLAN  Take 1 tablet (5 mg total) by mouth 4 (four) times daily.     nitroGLYCERIN 0.4 MG SL tablet  Commonly known as:  NITROSTAT  Place 1 tablet (0.4 mg total) under the tongue every 5 (five) minutes as  needed for chest pain.     omeprazole 40 MG capsule  Commonly known as:  PRILOSEC  Take 1 capsule (40 mg total) by mouth 2 (two) times daily before a meal.     predniSONE 20 MG tablet  Commonly known as:  DELTASONE  Take 1 tablet (20 mg total) by mouth daily with breakfast.     sildenafil 20 MG tablet  Commonly known as:  REVATIO  2-5 pills as needed for ED symptoms     Vitamin D (Ergocalciferol) 50000 UNITS Caps capsule  Commonly known as:  DRISDOL  Take 1 capsule (50,000 Units total) by mouth every 7 (seven) days.        Filed Vitals:   12/02/15 0450 12/02/15 1000  BP: 112/65 118/68  Pulse: 79 86  Temp: 98.7 F (37.1 C) 98 F (36.7 C)  Resp: 17 18    Skin clean, dry and intact without evidence of skin break down, no evidence of skin tears noted. IV catheter discontinued intact. Site without signs and symptoms of complications. Dressing and pressure applied. Pt denies pain at this time. No complaints noted.  An After Visit Summary was printed and given  to the patient. Patient escorted via [place], and D/C home via private auto.  [name] [name] A 12/02/2015 6:52 PM

## 2015-12-02 NOTE — Care Management Important Message (Signed)
Important Message  Patient Details  Name: [name] [name] MRN: [id number] Date of Birth: [birth year]   Medicare Important Message Given:  Yes    Tilly Pernice, Rory Percy, RN 12/02/2015, 2:43 PM

## 2015-12-02 NOTE — Discharge Summary (Signed)
Physician Discharge Summary  [name] [name] [id number] DOB: March 10, 1951 DOA: 11/27/2015  PCP: [name] Marek, MD  Admit date: 11/27/2015 Discharge date: 12/02/2015  Time spent: 25  minutes  Recommendations for Outpatient Follow-up:  1. Follow up with PCP in one week.    Discharge Diagnoses:  Principal Problem:   Sepsis due to pneumonia [facility]) Active Problems:   Essential hypertension, benign   Atrial fibrillation (HCC)   Acute respiratory failure (HCC)   Asthma exacerbation   Anemia   CHF (congestive heart failure) ([place])   Sepsis ([place])   Discharge Condition: improved  Diet recommendation: regular/ low sodium  Filed Weights   11/29/15 2029 11/30/15 2142 12/01/15 2001  Weight: 112.7 kg (248 lb 7.3 oz) 112.43 kg (247 lb 13.8 oz) 112.3 kg (247 lb 9.2 oz)    History of present illness:  [name] [name] is a 64 y.o. male with a history of Asthma, Diastolic CHF, HTN, Atrial Fibrillation who presented to the ED with complaints of increased SOB and Cough and Fevers and Chills x 4 days.  He also had increased wheezing. He was found to have a fever to 100.5, and was tachycardic to the 130's, and tachypneic to the 40s. He was placed on BiPAP, and administered Nebs, and a sepsis workup was initiated Resp status improving on Abx, steroids, nebs Repeat Cxr with interstitial edema, giving lasix and ordered ECHO. Echo unremarkable.  Plan for discharge today.  Hospital Course:  1. Acute Resp failure - Due to ?sepsis/ -Improving, Weaned off BiPAP -weaned off O2 -stop solumedrol, quick prednisone taper, duonebs and albuterol, antibiotics on discharge.    2. Sepsis/Bronchitis/Suspected community-acquired pneumonia -started on broad-spectrum antibiotics on admission -Changed to oral Levaquin today, cultures negative, suspect leukocytosis is primarily steroid related. -remains afebrile -lactic acid level improved.   3. Acute CHF /suspect Diastolic Repeat CXR shows low lung  volumes and bil lower lobe atelectasis, interstitial edema resolved.  -ECHO shows good LVEF, diastolic function parameters are normal.   4. Seizure disorder -Continue Keppra  5. Paroxysmal atrial fibrillation -In normal sinus rhythm, continue amiodarone and aspirin, reportedly not not on Coumadin due to history of seizure/fall risk   6. Anemia of chronic disease -stable   Procedures:  none  Consultations:  none  Discharge Exam: Filed Vitals:   12/02/15 0450 12/02/15 1000  BP: 112/65 118/68  Pulse: 79 86  Temp: 98.7 F (37.1 C) 98 F (36.7 C)  Resp: 17 18    General: alert afebrile comfortable.  Cardiovascular: s1s2 Respiratory: ctab  Discharge Instructions   Discharge Instructions    AMB Referral to [place] Management    Complete by:  As directed   Please assign to [facility] for CHF disease and symptom management. Reports he does not have a scale. Consents signed. Please call with questions. Marthenia Rolling, [place], [facility] [phone]  Reason for consult:  Please assign to [facility] RNCM  Diagnoses of:  Heart Failure  Expected date of contact:  1-3 days (reserved for hospital discharges)     Diet - low sodium heart healthy    Complete by:  As directed      Discharge instructions    Complete by:  As directed   Follow up with PCP in 1 to 2 weeks.          Discharge Medication List as of 12/02/2015  4:16 PM    START taking these medications   Details  albuterol (PROVENTIL) (2.5 MG/3ML) 0.083% nebulizer solution Take  3 mLs (2.5 mg total) by nebulization every 4 (four) hours as needed for wheezing or shortness of breath., Starting 12/02/2015, Until Discontinued, Print    levofloxacin (LEVAQUIN) 750 MG tablet Take 1 tablet (750 mg total) by mouth daily., Starting 12/02/2015, Until Discontinued, Print    predniSONE (DELTASONE) 20 MG tablet Take 1 tablet (20 mg total) by mouth daily with breakfast., Starting 12/02/2015, Until Discontinued,  Print      CONTINUE these medications which have NOT CHANGED   Details  albuterol (PROVENTIL HFA;VENTOLIN HFA) 108 (90 BASE) MCG/ACT inhaler Inhale 2 puffs into the lungs every 6 (six) hours as needed., Starting 03/12/2015, Until Discontinued, Normal    amiodarone (PACERONE) 200 MG tablet Take 1 tablet (200 mg total) by mouth daily., Starting 01/23/2014, Until Discontinued, Normal    aspirin EC 81 MG tablet Take 81 mg by mouth daily.  , Until Discontinued, Historical Med    clomiPHENE (CLOMID) 50 MG tablet 1/2 tab daily., Normal    clotrimazole-betamethasone (LOTRISONE) cream Apply 1 application topically 2 (two) times daily., Starting 03/12/2015, Until Discontinued, Print    ferrous sulfate 325 (65 FE) MG tablet Take 325 mg by mouth daily with breakfast., Until Discontinued, Historical Med    fluticasone-salmeterol (ADVAIR HFA) 115-21 MCG/ACT inhaler Inhale 2 puffs into the lungs daily as needed. For shortness of breath, Starting 03/12/2015, Until Discontinued, Normal    levETIRAcetam (KEPPRA) 750 MG tablet Take 2 tablets (1,500 mg total) by mouth 2 (two) times daily., Starting 04/01/2015, Until Discontinued, Normal    lisinopril (PRINIVIL,ZESTRIL) 20 MG tablet Take 20 mg by mouth daily. , Starting 12/02/2014, Until Discontinued, Historical Med    metoCLOPramide (REGLAN) 5 MG tablet Take 1 tablet (5 mg total) by mouth 4 (four) times daily., Starting 10/10/2013, Until Discontinued, Print    nitroGLYCERIN (NITROSTAT) 0.4 MG SL tablet Place 1 tablet (0.4 mg total) under the tongue every 5 (five) minutes as needed for chest pain., Starting 01/25/2014, Until Discontinued, Print    omeprazole (PRILOSEC) 40 MG capsule Take 1 capsule (40 mg total) by mouth 2 (two) times daily before a meal., Starting 10/10/2013, Until Discontinued, Print    sildenafil (REVATIO) 20 MG tablet 2-5 pills as needed for ED symptoms, Print    Vitamin D, Ergocalciferol, (DRISDOL) 50000 UNITS CAPS capsule Take 1 capsule  (50,000 Units total) by mouth every 7 (seven) days., Starting 01/27/2014, Until Discontinued, Normal      STOP taking these medications     pantoprazole (PROTONIX) 40 MG tablet        No Known Allergies Follow-up Information    Follow up with [facility] On 12/14/2015.   Why:  APPOINTMENT:  Tuesday, 12-14-15 @ 10:30am, ARRIVE by 10:15am   Contact information:   [address] [place] [phone]      Please follow up.   Why:  Pt. notified of appointment via phone number listed in chart; notification made 12-02-15 @ 1656       The results of significant diagnostics from this hospitalization (including imaging, microbiology, ancillary and laboratory) are listed below for reference.    Significant Diagnostic Studies: Dg Chest 2 View  11/29/2015  CLINICAL DATA:  Shortness of breath. EXAM: CHEST  2 VIEW COMPARISON:  11/27/2015. FINDINGS: Mediastinum and hilar structures normal. Cardiomegaly. Mild pulmonary venous congestion. Low lung volumes with bibasilar atelectasis. Small pleural effusions. No pneumothorax. IMPRESSION: 1. Omega with mild pulmonary vascular prominence. Small bilateral pleural effusions. Mild component congestive heart  failure cannot be excluded. 2. Bibasilar subsegmental atelectasis and/or mild infiltrates Electronically Signed   By: [place]   On: 11/29/2015 08:14   Dg Chest Port 1 View  12/01/2015  CLINICAL DATA:  Follow-up EXAM: PORTABLE CHEST 1 VIEW COMPARISON:  11/29/2015 FINDINGS: Low lung volumes with lingular and bilateral lower lobe atelectasis. Small left pleural effusion. No frank interstitial edema. No pneumothorax. Cardiomegaly. IMPRESSION: Low lung volumes with lingular and bilateral lower lobe atelectasis. Small left pleural effusion. Electronically Signed   By: Julian Hy M.D.   On: 12/01/2015 12:00   Dg Chest Port 1 View  11/27/2015  CLINICAL DATA:  Shortness of breath for the  past 3-4 hours. EXAM: PORTABLE CHEST 1 VIEW COMPARISON:  01/24/2014. FINDINGS: Poor inspiration with a significantly decreased inspiration compared to the previous examination. This is magnifying the cardiac silhouette. Taking this and patient rotation to the right into account, there has probably been no significant change in a normal sized heart. The aorta is tortuous. The pulmonary vasculature is crowded by the poor inspiration. There is minimal ill-defined opacity at the left lateral lung base. Unremarkable bones. IMPRESSION: Very poor inspiration with minimal probable atelectasis at the left lung base. Electronically Signed   By: Claudie Revering M.D.   On: 11/27/2015 20:08    Microbiology: Recent Results (from the past 240 hour(s))  Blood Culture (routine x 2)     Status: None   Collection Time: 11/27/15  8:10 PM  Result Value Ref Range Status   Specimen Description BLOOD LEFT HAND  Final   Special Requests BOTTLES DRAWN AEROBIC AND ANAEROBIC 5CC  Final   Culture NO GROWTH 5 DAYS  Final   Report Status 12/02/2015 FINAL  Final  Urine culture     Status: None   Collection Time: 11/27/15  8:13 PM  Result Value Ref Range Status   Specimen Description URINE, CLEAN CATCH  Final   Special Requests NONE  Final   Culture MULTIPLE SPECIES PRESENT, SUGGEST RECOLLECTION  Final   Report Status 11/29/2015 FINAL  Final  Blood Culture (routine x 2)     Status: None (Preliminary result)   Collection Time: 11/27/15 11:40 PM  Result Value Ref Range Status   Specimen Description BLOOD RIGHT WRIST  Final   Special Requests IN PEDIATRIC BOTTLE 5CC  POS ON ZOSYN AND VANCO  Final   Culture NO GROWTH 4 DAYS  Final   Report Status PENDING  Incomplete  MRSA PCR Screening     Status: Abnormal   Collection Time: 11/28/15  2:42 AM  Result Value Ref Range Status   MRSA by PCR POSITIVE (A) NEGATIVE Final    Comment:        The GeneXpert MRSA Assay (FDA approved for NASAL specimens only), is one component of  a comprehensive MRSA colonization surveillance program. It is not intended to diagnose MRSA infection nor to guide or monitor treatment for MRSA infections. RESULT CALLED TO, READ BACK BY AND VERIFIED WITH: [facility] RN [id number] AT [id number] SKEEN,P      Labs: Basic Metabolic Panel:  Recent Labs Lab 11/27/15 1911 11/28/15 0355 11/29/15 0247 11/30/15 0625 12/01/15 0304  NA 134* 135 138 139 141  K 3.8 4.2 4.7 4.1 4.2  CL 100* 104 107 105 104  CO2 25 23 25 24 25   GLUCOSE 130* 183* 135* 143* 105*  BUN 10 11 15 17  22*  CREATININE 1.28* 1.33* 1.29* 1.27* 1.22  CALCIUM 9.2 8.6* 8.7* 8.9 8.9   Liver  Function Tests: No results for input(s): AST, ALT, ALKPHOS, BILITOT, PROT, ALBUMIN in the last 168 hours.  Recent Labs Lab 11/27/15 2015  LIPASE 32   No results for input(s): AMMONIA in the last 168 hours. CBC:  Recent Labs Lab 11/28/15 0355 11/29/15 0247 11/30/15 0625 12/01/15 0304 12/02/15 1100  WBC 16.7* 29.6* 22.7* 20.2* 17.2*  HGB 10.5* 10.3* 10.7* 11.2* 12.0*  HCT 34.5* 32.7* 35.5* 35.8* 37.9*  MCV 68.2* 67.7* 67.9* 67.4* 66.8*  PLT 319 365 434* 441* 479*   Cardiac Enzymes: No results for input(s): CKTOTAL, CKMB, CKMBINDEX, TROPONINI in the last 168 hours. BNP: BNP (last 3 results)  Recent Labs  11/27/15 1911  BNP 18.7    ProBNP (last 3 results) No results for input(s): PROBNP in the last 8760 hours.  CBG: No results for input(s): GLUCAP in the last 168 hours.     SignedHosie Poisson  [facility] 12/02/2015, 6:54 PM

## 2015-12-03 ENCOUNTER — Other Ambulatory Visit: Payer: Self-pay | Admitting: *Deleted

## 2015-12-03 LAB — CULTURE, BLOOD (ROUTINE X 2): CULTURE: NO GROWTH

## 2015-12-06 ENCOUNTER — Other Ambulatory Visit: Payer: Self-pay | Admitting: *Deleted

## 2015-12-06 NOTE — Patient Outreach (Signed)
[facility] [place]) Care Management  12/06/2015  BRIGHT KILLE 02-15-1951 [id number]   Assessment: Transition of care call - week 1 Referral from hospital liaison Lonn Georgia) for CHF disease and symptom management and need for weighing scale. 64 year old male with recent admission to hospital on 12/3-12/8 with sepsis due to pneumonia and asthma exacerbation. Transition of care call completed. Patient reports "feeling pretty good over all".  Patient has a good understanding of medications and hospital stay. He reports continued antibiotic (Levaquin) use until completed. Patient has all his medication supplies as reported and taking medications as instructed..   Patient's follow-up visit with primary provider at [facility] is scheduled on 12/20. Encouraged patient to call and try to schedule follow up appointment at an earlier time per discharge instructions (follow-up in 1 week). According to patient, transportation is not an issue since he has friends to provide transport or he can drive himself as mentioned.  He mentioned that he is not able to weigh self daily since he has no weighing scale. Patient is eligible for weighing scale as a Medicaid recipient. Will provide assist for patient's nebulizing machine need as well.  Mr. Bonadio denies any other urgent needs or concerns at this time. He agreed to home visit next week. Provided contact informations for [facility], care management coordinator and 24- hour nurse line and encouraged to call if necessary.   Plan: Initial home visit on 12/20. Will provide [facility] heart failure packet of information and weighing scale.   Charnay Nazario A. Melquisedec Journey, BSN, RN-BC [place] Management Coordinator Cell: [phone]

## 2015-12-07 ENCOUNTER — Telehealth: Payer: Self-pay | Admitting: Internal Medicine

## 2015-12-07 NOTE — Telephone Encounter (Signed)
Patient called requesting a nebulizer. Please follow up with patient

## 2015-12-07 NOTE — Telephone Encounter (Signed)
Nurse called patient, line is busy.  Nurse called patient to follow up with nebulizer request.

## 2015-12-08 ENCOUNTER — Encounter (HOSPITAL_COMMUNITY): Payer: Self-pay | Admitting: Cardiology

## 2015-12-08 ENCOUNTER — Inpatient Hospital Stay (HOSPITAL_COMMUNITY)
Admission: EM | Admit: 2015-12-08 | Discharge: 2015-12-18 | DRG: 190 | Disposition: A | Discharge status: Home Health | Payer: Commercial Managed Care - HMO | Attending: Cardiology | Admitting: Cardiology

## 2015-12-08 ENCOUNTER — Emergency Department (HOSPITAL_COMMUNITY): Payer: Commercial Managed Care - HMO

## 2015-12-08 ENCOUNTER — Ambulatory Visit (HOSPITAL_COMMUNITY): Admit: 2015-12-08 | Payer: Self-pay | Admitting: Interventional Cardiology

## 2015-12-08 ENCOUNTER — Encounter (HOSPITAL_COMMUNITY)
Admission: EM | Disposition: A | Discharge status: Home Health | Payer: Self-pay | Source: Home / Self Care | Attending: Cardiology

## 2015-12-08 DIAGNOSIS — J189 Pneumonia, unspecified organism: Secondary | ICD-10-CM | POA: Insufficient documentation

## 2015-12-08 DIAGNOSIS — J440 Chronic obstructive pulmonary disease with acute lower respiratory infection: Secondary | ICD-10-CM | POA: Diagnosis not present

## 2015-12-08 DIAGNOSIS — D6489 Other specified anemias: Secondary | ICD-10-CM | POA: Diagnosis not present

## 2015-12-08 DIAGNOSIS — K5900 Constipation, unspecified: Secondary | ICD-10-CM | POA: Diagnosis present

## 2015-12-08 DIAGNOSIS — I4892 Unspecified atrial flutter: Secondary | ICD-10-CM | POA: Diagnosis not present

## 2015-12-08 DIAGNOSIS — I480 Paroxysmal atrial fibrillation: Secondary | ICD-10-CM | POA: Diagnosis present

## 2015-12-08 DIAGNOSIS — J45909 Unspecified asthma, uncomplicated: Secondary | ICD-10-CM | POA: Diagnosis present

## 2015-12-08 DIAGNOSIS — R0602 Shortness of breath: Secondary | ICD-10-CM

## 2015-12-08 DIAGNOSIS — I959 Hypotension, unspecified: Secondary | ICD-10-CM | POA: Diagnosis not present

## 2015-12-08 DIAGNOSIS — D500 Iron deficiency anemia secondary to blood loss (chronic): Secondary | ICD-10-CM | POA: Diagnosis not present

## 2015-12-08 DIAGNOSIS — I309 Acute pericarditis, unspecified: Secondary | ICD-10-CM | POA: Diagnosis not present

## 2015-12-08 DIAGNOSIS — D72828 Other elevated white blood cell count: Secondary | ICD-10-CM | POA: Diagnosis present

## 2015-12-08 DIAGNOSIS — I2510 Atherosclerotic heart disease of native coronary artery without angina pectoris: Secondary | ICD-10-CM | POA: Diagnosis present

## 2015-12-08 DIAGNOSIS — Z7952 Long term (current) use of systemic steroids: Secondary | ICD-10-CM

## 2015-12-08 DIAGNOSIS — J449 Chronic obstructive pulmonary disease, unspecified: Secondary | ICD-10-CM | POA: Diagnosis not present

## 2015-12-08 DIAGNOSIS — R011 Cardiac murmur, unspecified: Secondary | ICD-10-CM | POA: Diagnosis not present

## 2015-12-08 DIAGNOSIS — M1990 Unspecified osteoarthritis, unspecified site: Secondary | ICD-10-CM | POA: Diagnosis present

## 2015-12-08 DIAGNOSIS — Y95 Nosocomial condition: Secondary | ICD-10-CM | POA: Diagnosis present

## 2015-12-08 DIAGNOSIS — R069 Unspecified abnormalities of breathing: Secondary | ICD-10-CM | POA: Diagnosis not present

## 2015-12-08 DIAGNOSIS — I213 ST elevation (STEMI) myocardial infarction of unspecified site: Secondary | ICD-10-CM | POA: Diagnosis not present

## 2015-12-08 DIAGNOSIS — R079 Chest pain, unspecified: Secondary | ICD-10-CM | POA: Diagnosis present

## 2015-12-08 DIAGNOSIS — Z79890 Hormone replacement therapy (postmenopausal): Secondary | ICD-10-CM | POA: Diagnosis not present

## 2015-12-08 DIAGNOSIS — Z79899 Other long term (current) drug therapy: Secondary | ICD-10-CM

## 2015-12-08 DIAGNOSIS — Z5329 Procedure and treatment not carried out because of patient's decision for other reasons: Secondary | ICD-10-CM | POA: Diagnosis not present

## 2015-12-08 DIAGNOSIS — J90 Pleural effusion, not elsewhere classified: Secondary | ICD-10-CM | POA: Diagnosis not present

## 2015-12-08 DIAGNOSIS — I110 Hypertensive heart disease with heart failure: Secondary | ICD-10-CM | POA: Diagnosis not present

## 2015-12-08 DIAGNOSIS — J188 Other pneumonia, unspecified organism: Secondary | ICD-10-CM

## 2015-12-08 DIAGNOSIS — I119 Hypertensive heart disease without heart failure: Secondary | ICD-10-CM | POA: Diagnosis not present

## 2015-12-08 DIAGNOSIS — E785 Hyperlipidemia, unspecified: Secondary | ICD-10-CM | POA: Diagnosis present

## 2015-12-08 DIAGNOSIS — Z23 Encounter for immunization: Secondary | ICD-10-CM

## 2015-12-08 DIAGNOSIS — R0902 Hypoxemia: Secondary | ICD-10-CM | POA: Diagnosis not present

## 2015-12-08 DIAGNOSIS — H532 Diplopia: Secondary | ICD-10-CM | POA: Diagnosis present

## 2015-12-08 DIAGNOSIS — R269 Unspecified abnormalities of gait and mobility: Secondary | ICD-10-CM | POA: Diagnosis not present

## 2015-12-08 DIAGNOSIS — K219 Gastro-esophageal reflux disease without esophagitis: Secondary | ICD-10-CM | POA: Diagnosis present

## 2015-12-08 DIAGNOSIS — I5032 Chronic diastolic (congestive) heart failure: Secondary | ICD-10-CM | POA: Diagnosis present

## 2015-12-08 DIAGNOSIS — D649 Anemia, unspecified: Secondary | ICD-10-CM | POA: Diagnosis not present

## 2015-12-08 DIAGNOSIS — I454 Nonspecific intraventricular block: Secondary | ICD-10-CM | POA: Diagnosis not present

## 2015-12-08 DIAGNOSIS — T380X5A Adverse effect of glucocorticoids and synthetic analogues, initial encounter: Secondary | ICD-10-CM | POA: Diagnosis present

## 2015-12-08 DIAGNOSIS — I301 Infective pericarditis: Secondary | ICD-10-CM | POA: Diagnosis not present

## 2015-12-08 DIAGNOSIS — J9811 Atelectasis: Secondary | ICD-10-CM | POA: Diagnosis not present

## 2015-12-08 DIAGNOSIS — R072 Precordial pain: Secondary | ICD-10-CM | POA: Diagnosis not present

## 2015-12-08 DIAGNOSIS — R0600 Dyspnea, unspecified: Secondary | ICD-10-CM | POA: Diagnosis not present

## 2015-12-08 DIAGNOSIS — Z7982 Long term (current) use of aspirin: Secondary | ICD-10-CM

## 2015-12-08 DIAGNOSIS — J181 Lobar pneumonia, unspecified organism: Secondary | ICD-10-CM

## 2015-12-08 DIAGNOSIS — G4733 Obstructive sleep apnea (adult) (pediatric): Secondary | ICD-10-CM | POA: Diagnosis not present

## 2015-12-08 DIAGNOSIS — I5040 Unspecified combined systolic (congestive) and diastolic (congestive) heart failure: Secondary | ICD-10-CM | POA: Diagnosis not present

## 2015-12-08 DIAGNOSIS — R9431 Abnormal electrocardiogram [ECG] [EKG]: Secondary | ICD-10-CM | POA: Diagnosis not present

## 2015-12-08 DIAGNOSIS — J948 Other specified pleural conditions: Secondary | ICD-10-CM | POA: Diagnosis not present

## 2015-12-08 DIAGNOSIS — A419 Sepsis, unspecified organism: Secondary | ICD-10-CM | POA: Diagnosis not present

## 2015-12-08 DIAGNOSIS — Z6837 Body mass index (BMI) 37.0-37.9, adult: Secondary | ICD-10-CM | POA: Diagnosis not present

## 2015-12-08 DIAGNOSIS — R0789 Other chest pain: Secondary | ICD-10-CM | POA: Diagnosis not present

## 2015-12-08 DIAGNOSIS — Z049 Encounter for examination and observation for unspecified reason: Secondary | ICD-10-CM

## 2015-12-08 DIAGNOSIS — J168 Pneumonia due to other specified infectious organisms: Secondary | ICD-10-CM | POA: Diagnosis not present

## 2015-12-08 HISTORY — PX: CARDIAC CATHETERIZATION: SHX172

## 2015-12-08 LAB — CBC WITH DIFFERENTIAL/PLATELET
BASOS PCT: 0 %
Basophils Absolute: 0 10*3/uL (ref 0.0–0.1)
EOS PCT: 0 %
Eosinophils Absolute: 0 10*3/uL (ref 0.0–0.7)
HCT: 34.7 % — ABNORMAL LOW (ref 39.0–52.0)
Hemoglobin: 10.9 g/dL — ABNORMAL LOW (ref 13.0–17.0)
LYMPHS ABS: 2.4 10*3/uL (ref 0.7–4.0)
Lymphocytes Relative: 9 %
MCH: 21.1 pg — AB (ref 26.0–34.0)
MCHC: 31.4 g/dL (ref 30.0–36.0)
MCV: 67.2 fL — AB (ref 78.0–100.0)
MONO ABS: 4.8 10*3/uL — AB (ref 0.1–1.0)
Monocytes Relative: 18 %
NEUTROS ABS: 19.6 10*3/uL — AB (ref 1.7–7.7)
Neutrophils Relative %: 73 %
PLATELETS: 469 10*3/uL — AB (ref 150–400)
RBC: 5.16 MIL/uL (ref 4.22–5.81)
RDW: 16.1 % — AB (ref 11.5–15.5)
WBC: 26.8 10*3/uL — AB (ref 4.0–10.5)

## 2015-12-08 LAB — POCT ACTIVATED CLOTTING TIME: Activated Clotting Time: 162 seconds

## 2015-12-08 LAB — I-STAT CHEM 8, ED
BUN: 19 mg/dL (ref 6–20)
CHLORIDE: 106 mmol/L (ref 101–111)
CREATININE: 1.2 mg/dL (ref 0.61–1.24)
Calcium, Ion: 1.01 mmol/L — ABNORMAL LOW (ref 1.13–1.30)
GLUCOSE: 90 mg/dL (ref 65–99)
HEMATOCRIT: 33 % — AB (ref 39.0–52.0)
Hemoglobin: 11.2 g/dL — ABNORMAL LOW (ref 13.0–17.0)
POTASSIUM: 3.7 mmol/L (ref 3.5–5.1)
Sodium: 140 mmol/L (ref 135–145)
TCO2: 23 mmol/L (ref 0–100)

## 2015-12-08 LAB — CBC
HEMATOCRIT: 31.2 % — AB (ref 39.0–52.0)
HEMOGLOBIN: 9.7 g/dL — AB (ref 13.0–17.0)
MCH: 21 pg — ABNORMAL LOW (ref 26.0–34.0)
MCHC: 31.1 g/dL (ref 30.0–36.0)
MCV: 67.7 fL — ABNORMAL LOW (ref 78.0–100.0)
PLATELETS: 437 10*3/uL — AB (ref 150–400)
RBC: 4.61 MIL/uL (ref 4.22–5.81)
RDW: 16 % — ABNORMAL HIGH (ref 11.5–15.5)
WBC: 22.5 10*3/uL — ABNORMAL HIGH (ref 4.0–10.5)

## 2015-12-08 LAB — COMPREHENSIVE METABOLIC PANEL
ALBUMIN: 2.5 g/dL — AB (ref 3.5–5.0)
ALK PHOS: 103 U/L (ref 38–126)
ALT: 48 U/L (ref 17–63)
ALT: 52 U/L (ref 17–63)
AST: 31 U/L (ref 15–41)
AST: 35 U/L (ref 15–41)
Albumin: 2.8 g/dL — ABNORMAL LOW (ref 3.5–5.0)
Alkaline Phosphatase: 127 U/L — ABNORMAL HIGH (ref 38–126)
Anion gap: 8 (ref 5–15)
Anion gap: 10 (ref 5–15)
BILIRUBIN TOTAL: 1.6 mg/dL — AB (ref 0.3–1.2)
BUN: 16 mg/dL (ref 6–20)
BUN: 17 mg/dL (ref 6–20)
CALCIUM: 7.8 mg/dL — AB (ref 8.9–10.3)
CHLORIDE: 109 mmol/L (ref 101–111)
CHLORIDE: 100 mmol/L — AB (ref 101–111)
CO2: 20 mmol/L — AB (ref 22–32)
CO2: 25 mmol/L (ref 22–32)
CREATININE: 1.23 mg/dL (ref 0.61–1.24)
CREATININE: 1.44 mg/dL — AB (ref 0.61–1.24)
Calcium: 8.7 mg/dL — ABNORMAL LOW (ref 8.9–10.3)
GFR calc non Af Amer: 59 mL/min — ABNORMAL LOW (ref >60)
GFR, EST AFRICAN AMERICAN: 57 mL/min — AB (ref >60)
GFR, EST NON AFRICAN AMERICAN: 49 mL/min — AB (ref >60)
GLUCOSE: 94 mg/dL (ref 65–99)
Glucose, Bld: 99 mg/dL (ref 65–99)
POTASSIUM: 3.6 mmol/L (ref 3.5–5.1)
Potassium: 3.6 mmol/L (ref 3.5–5.1)
SODIUM: 137 mmol/L (ref 135–145)
Sodium: 135 mmol/L (ref 135–145)
TOTAL PROTEIN: 8.1 g/dL (ref 6.5–8.1)
Total Bilirubin: 1 mg/dL (ref 0.3–1.2)
Total Protein: 7 g/dL (ref 6.5–8.1)

## 2015-12-08 LAB — POCT I-STAT TROPONIN I: TROPONIN I, POC: 0 ng/mL (ref 0.00–0.08)

## 2015-12-08 LAB — PROTIME-INR
INR: 1.28 (ref 0.00–1.49)
Prothrombin Time: 16.1 seconds — ABNORMAL HIGH (ref 11.6–15.2)

## 2015-12-08 LAB — MAGNESIUM: MAGNESIUM: 2.2 mg/dL (ref 1.7–2.4)

## 2015-12-08 LAB — BRAIN NATRIURETIC PEPTIDE: B Natriuretic Peptide: 88.1 pg/mL (ref 0.0–100.0)

## 2015-12-08 LAB — TSH: TSH: 1.545 u[IU]/mL (ref 0.350–4.500)

## 2015-12-08 LAB — APTT: aPTT: 34 seconds (ref 24–37)

## 2015-12-08 LAB — TROPONIN I: Troponin I: <0.03 ng/mL (ref <0.031)

## 2015-12-08 SURGERY — LEFT HEART CATH AND CORONARY ANGIOGRAPHY

## 2015-12-08 MED ORDER — LIDOCAINE HCL (PF) 1 % IJ SOLN
INTRAMUSCULAR | Status: DC | PRN
  Administered 2015-12-08: 30 mL

## 2015-12-08 MED ORDER — PANTOPRAZOLE SODIUM 40 MG PO TBEC
40.0000 mg | DELAYED_RELEASE_TABLET | Freq: Every day | ORAL | Status: DC
  Administered 2015-12-08 – 2015-12-13 (×6): 40 mg via ORAL
  Filled 2015-12-08 (×6): qty 1

## 2015-12-08 MED ORDER — FUROSEMIDE 10 MG/ML IJ SOLN
INTRAMUSCULAR | Status: DC | PRN
  Administered 2015-12-08: 40 mg via INTRAVENOUS

## 2015-12-08 MED ORDER — SODIUM CHLORIDE 0.9 % IV SOLN: INTRAVENOUS | Status: DC

## 2015-12-08 MED ORDER — INFLUENZA VAC SPLIT QUAD 0.5 ML IM SUSY
0.5000 mL | PREFILLED_SYRINGE | INTRAMUSCULAR | Status: AC
  Administered 2015-12-09: 0.5 mL via INTRAMUSCULAR
  Filled 2015-12-08: qty 0.5

## 2015-12-08 MED ORDER — SODIUM CHLORIDE 0.9 % IV SOLN
INTRAVENOUS | Status: DC | PRN
  Administered 2015-12-08: 10 mL/h via INTRAVENOUS

## 2015-12-08 MED ORDER — NITROGLYCERIN 1 MG/10 ML FOR IR/CATH LAB
INTRA_ARTERIAL | Status: DC | PRN
  Administered 2015-12-08: 12:00:00

## 2015-12-08 MED ORDER — NITROGLYCERIN 1 MG/10 ML FOR IR/CATH LAB
INTRA_ARTERIAL | Status: AC
  Filled 2015-12-08: qty 10

## 2015-12-08 MED ORDER — SODIUM CHLORIDE 0.9 % IV SOLN: 250.0000 mL | INTRAVENOUS | Status: DC | PRN

## 2015-12-08 MED ORDER — LEVETIRACETAM 750 MG PO TABS
1500.0000 mg | ORAL_TABLET | Freq: Two times a day (BID) | ORAL | Status: DC
  Administered 2015-12-08 – 2015-12-18 (×21): 1500 mg via ORAL
  Filled 2015-12-08 (×22): qty 2

## 2015-12-08 MED ORDER — ACETAMINOPHEN 325 MG PO TABS
650.0000 mg | ORAL_TABLET | ORAL | Status: DC | PRN
  Administered 2015-12-08 – 2015-12-12 (×2): 650 mg via ORAL
  Filled 2015-12-08 (×2): qty 2

## 2015-12-08 MED ORDER — IOHEXOL 350 MG/ML SOLN
INTRAVENOUS | Status: DC | PRN
  Administered 2015-12-08: 75 mL via INTRAVENOUS

## 2015-12-08 MED ORDER — SODIUM CHLORIDE 0.9 % IV SOLN: INTRAVENOUS | Status: AC

## 2015-12-08 MED ORDER — HEPARIN (PORCINE) IN NACL 2-0.9 UNIT/ML-% IJ SOLN
INTRAMUSCULAR | Status: AC
  Filled 2015-12-08: qty 1000

## 2015-12-08 MED ORDER — AMIODARONE HCL 150 MG/3ML IV SOLN
INTRAVENOUS | Status: AC
  Filled 2015-12-08: qty 3

## 2015-12-08 MED ORDER — SODIUM CHLORIDE 0.9 % IJ SOLN: 3.0000 mL | INTRAMUSCULAR | Status: DC | PRN

## 2015-12-08 MED ORDER — FERROUS SULFATE 325 (65 FE) MG PO TABS
325.0000 mg | ORAL_TABLET | Freq: Every day | ORAL | Status: DC
  Administered 2015-12-09 – 2015-12-18 (×10): 325 mg via ORAL
  Filled 2015-12-08 (×10): qty 1

## 2015-12-08 MED ORDER — MOMETASONE FURO-FORMOTEROL FUM 100-5 MCG/ACT IN AERO
2.0000 | INHALATION_SPRAY | Freq: Two times a day (BID) | RESPIRATORY_TRACT | Status: DC
  Administered 2015-12-08 – 2015-12-18 (×20): 2 via RESPIRATORY_TRACT
  Filled 2015-12-08 (×2): qty 8.8

## 2015-12-08 MED ORDER — SODIUM CHLORIDE 0.9 % IJ SOLN
3.0000 mL | Freq: Two times a day (BID) | INTRAMUSCULAR | Status: DC
  Administered 2015-12-09 – 2015-12-11 (×3): 3 mL via INTRAVENOUS

## 2015-12-08 MED ORDER — ASPIRIN 300 MG RE SUPP: 300.0000 mg | RECTAL | Status: DC

## 2015-12-08 MED ORDER — NITROGLYCERIN 0.4 MG SL SUBL
0.4000 mg | SUBLINGUAL_TABLET | SUBLINGUAL | Status: DC | PRN
  Administered 2015-12-09 – 2015-12-12 (×2): 0.4 mg via SUBLINGUAL
  Filled 2015-12-08 (×2): qty 1

## 2015-12-08 MED ORDER — ATORVASTATIN CALCIUM 40 MG PO TABS
40.0000 mg | ORAL_TABLET | Freq: Every day | ORAL | Status: DC
  Administered 2015-12-08 – 2015-12-18 (×11): 40 mg via ORAL
  Filled 2015-12-08 (×11): qty 1

## 2015-12-08 MED ORDER — HEPARIN SODIUM (PORCINE) 5000 UNIT/ML IJ SOLN
INTRAMUSCULAR | Status: AC
  Filled 2015-12-08: qty 1

## 2015-12-08 MED ORDER — ASPIRIN 81 MG PO CHEW
324.0000 mg | CHEWABLE_TABLET | Freq: Once | ORAL | Status: AC
  Administered 2015-12-08: 324 mg via ORAL

## 2015-12-08 MED ORDER — AMIODARONE HCL 150 MG/3ML IV SOLN
INTRAVENOUS | Status: DC | PRN
  Administered 2015-12-08 (×2): 150 mg via INTRAVENOUS

## 2015-12-08 MED ORDER — ONDANSETRON HCL 4 MG/2ML IJ SOLN: 4.0000 mg | Freq: Four times a day (QID) | INTRAMUSCULAR | Status: DC | PRN

## 2015-12-08 MED ORDER — MORPHINE SULFATE (PF) 10 MG/ML IV SOLN
INTRAVENOUS | Status: DC | PRN
  Administered 2015-12-08: 2 mg via INTRAVENOUS

## 2015-12-08 MED ORDER — LEVALBUTEROL HCL 1.25 MG/0.5ML IN NEBU
1.2500 mg | INHALATION_SOLUTION | Freq: Four times a day (QID) | RESPIRATORY_TRACT | Status: DC | PRN
  Administered 2015-12-08 – 2015-12-10 (×5): 1.25 mg via RESPIRATORY_TRACT
  Filled 2015-12-08 (×7): qty 0.5

## 2015-12-08 MED ORDER — AMIODARONE HCL 200 MG PO TABS
400.0000 mg | ORAL_TABLET | Freq: Two times a day (BID) | ORAL | Status: DC
  Administered 2015-12-08 – 2015-12-09 (×3): 400 mg via ORAL
  Filled 2015-12-08 (×4): qty 2

## 2015-12-08 MED ORDER — METOPROLOL TARTRATE 12.5 MG HALF TABLET
12.5000 mg | ORAL_TABLET | Freq: Two times a day (BID) | ORAL | Status: DC
  Administered 2015-12-08 – 2015-12-13 (×12): 12.5 mg via ORAL
  Filled 2015-12-08 (×13): qty 1

## 2015-12-08 MED ORDER — SODIUM CHLORIDE 0.9 % IJ SOLN
3.0000 mL | Freq: Two times a day (BID) | INTRAMUSCULAR | Status: DC
  Administered 2015-12-08 – 2015-12-14 (×12): 3 mL via INTRAVENOUS

## 2015-12-08 MED ORDER — ASPIRIN 81 MG PO CHEW
CHEWABLE_TABLET | ORAL | Status: AC
  Filled 2015-12-08: qty 4

## 2015-12-08 MED ORDER — HEPARIN SODIUM (PORCINE) 5000 UNIT/ML IJ SOLN
4000.0000 [IU] | INTRAMUSCULAR | Status: AC
  Administered 2015-12-08: 4000 [IU] via INTRAVENOUS

## 2015-12-08 MED ORDER — BIVALIRUDIN 250 MG IV SOLR
INTRAVENOUS | Status: AC
  Filled 2015-12-08: qty 250

## 2015-12-08 MED ORDER — MORPHINE SULFATE (PF) 10 MG/ML IV SOLN
INTRAVENOUS | Status: AC
  Filled 2015-12-08: qty 1

## 2015-12-08 MED ORDER — FUROSEMIDE 10 MG/ML IJ SOLN
INTRAMUSCULAR | Status: AC
  Filled 2015-12-08: qty 4

## 2015-12-08 MED ORDER — LIDOCAINE HCL (PF) 1 % IJ SOLN
INTRAMUSCULAR | Status: AC
  Filled 2015-12-08: qty 30

## 2015-12-08 MED ORDER — ASPIRIN EC 81 MG PO TBEC
81.0000 mg | DELAYED_RELEASE_TABLET | Freq: Every day | ORAL | Status: DC
  Administered 2015-12-09: 81 mg via ORAL
  Filled 2015-12-08: qty 1

## 2015-12-08 MED ORDER — ASPIRIN 81 MG PO CHEW: 324.0000 mg | CHEWABLE_TABLET | ORAL | Status: DC

## 2015-12-08 MED ORDER — HEPARIN (PORCINE) IN NACL 2-0.9 UNIT/ML-% IJ SOLN
INTRAMUSCULAR | Status: AC
  Filled 2015-12-08: qty 500

## 2015-12-08 MED ORDER — NITROGLYCERIN 1 MG/10 ML FOR IR/CATH LAB
INTRA_ARTERIAL | Status: DC | PRN
  Administered 2015-12-08: 100 ug

## 2015-12-08 SURGICAL SUPPLY — 11 items
CATH INFINITI 5FR MULTPACK ANG (CATHETERS) ×3 IMPLANT
GLIDESHEATH SLEND SS 6F .021 (SHEATH) IMPLANT
KIT ENCORE 26 ADVANTAGE (KITS) ×2 IMPLANT
KIT HEART LEFT (KITS) ×3 IMPLANT
PACK CARDIAC CATHETERIZATION (CUSTOM PROCEDURE TRAY) ×3 IMPLANT
SHEATH PINNACLE 6F 10CM (SHEATH) ×2 IMPLANT
SYR MEDRAD MARK V 150ML (SYRINGE) ×3 IMPLANT
TRANSDUCER W/STOPCOCK (MISCELLANEOUS) ×3 IMPLANT
TUBING CIL FLEX 10 FLL-RA (TUBING) IMPLANT
WIRE EMERALD 3MM-J .035X150CM (WIRE) ×3 IMPLANT
WIRE SAFE-T 1.5MM-J .035X260CM (WIRE) IMPLANT

## 2015-12-08 NOTE — Progress Notes (Signed)
OOB at bedside. Tolerates well. Rt groin site benign s/s bleed or hematoma. +PP. Sitting up on side of bed to eat supper. No further needs expressed.

## 2015-12-08 NOTE — Telephone Encounter (Signed)
Nurse called patient, reached voicemail. Left message for patient to call Lis Savitt with [facility], at [phone]. Nurse called patient to follow up with nebulizer request.

## 2015-12-08 NOTE — Progress Notes (Signed)
The client refused a second lab draw because the first set was rejected per lab. I spoke to the client about the importance of the blood work and he still refused.

## 2015-12-08 NOTE — ED Provider Notes (Signed)
CSN: [id number]     Arrival date & time    History   First MD Initiated Contact with Patient 12/08/15 1035     Chief Complaint  Patient presents with  . Code STEMI     (Consider location/radiation/quality/duration/timing/severity/associated sxs/prior Treatment) HPI  64 year old male presents from his cardiologist office with shortness of breath, chest pain, and fatigue. Recently out of the hospital for pneumonia. Has been having aggressive symptoms for the last 3 days. Was going to his cardiologist office, Dr. Terrence Dupont to get checked out. Patient has audible wheezing and so EMS was called. They were treating his wheezing and when they had an EKG showed ST elevation. Patient states he is having some chest pressure and tightness as well as pain in his left shoulder. It is unclear exactly when this started. The patient states that he has significant fatigue especially with exertion and was barely able to walk across the parking lot to his doctor's office. Patient has chronic leg swelling, states this is not worse than typical.  Past Medical History  Diagnosis Date  . Hypertension   . Asthma   . Atrial fibrillation ([place])   . CHF (congestive heart failure) ([place])   . Anemia   . Heart disease   . COPD (chronic obstructive pulmonary disease) [facility])    Past Surgical History  Procedure Laterality Date  . Esophagus surgery     Family History  Problem Relation Age of Onset  . Cancer Mother    Social History  Substance Use Topics  . Smoking status: Never Smoker   . Smokeless tobacco: Never Used  . Alcohol Use: No    Review of Systems  Unable to perform ROS: Acuity of condition      Allergies  Review of patient's allergies indicates no known allergies.  Home Medications   Prior to Admission medications   Medication Sig Start Date End Date Taking? Authorizing Provider  albuterol (PROVENTIL HFA;VENTOLIN HFA) 108 (90 BASE) MCG/ACT inhaler Inhale 2 puffs into the lungs every 6 (six)  hours as needed. 03/12/15   Lorayne Marek, MD  albuterol (PROVENTIL) (2.5 MG/3ML) 0.083% nebulizer solution Take 3 mLs (2.5 mg total) by nebulization every 4 (four) hours as needed for wheezing or shortness of breath. Patient not taking: Reported on 12/06/2015 12/02/15   Hosie Poisson, MD  amiodarone (PACERONE) 200 MG tablet Take 1 tablet (200 mg total) by mouth daily. 01/23/14   Reyne Dumas, MD  aspirin EC 81 MG tablet Take 81 mg by mouth daily.      Historical Provider, MD  clomiPHENE (CLOMID) 50 MG tablet 1/2 tab daily. Patient taking differently: Take 25 mg by mouth daily. 1/2 tab daily. 11/25/15   Renato Shin, MD  clotrimazole-betamethasone (LOTRISONE) cream Apply 1 application topically 2 (two) times daily. Patient taking differently: Apply 1 application topically 2 (two) times daily as needed (FOR RASH).  03/12/15   Lorayne Marek, MD  ferrous sulfate 325 (65 FE) MG tablet Take 325 mg by mouth daily with breakfast.    Historical Provider, MD  fluticasone-salmeterol (ADVAIR HFA) 115-21 MCG/ACT inhaler Inhale 2 puffs into the lungs daily as needed. For shortness of breath 03/12/15   Lorayne Marek, MD  levETIRAcetam (KEPPRA) 750 MG tablet Take 2 tablets (1,500 mg total) by mouth 2 (two) times daily. 04/01/15   Marcial Pacas, MD  levofloxacin (LEVAQUIN) 750 MG tablet Take 1 tablet (750 mg total) by mouth daily. 12/02/15   Hosie Poisson, MD  lisinopril (PRINIVIL,ZESTRIL) 20 MG tablet Take  20 mg by mouth daily.  12/02/14   Historical Provider, MD  metoCLOPramide (REGLAN) 5 MG tablet Take 1 tablet (5 mg total) by mouth 4 (four) times daily. 10/10/13   Debbe Odea, MD  nitroGLYCERIN (NITROSTAT) 0.4 MG SL tablet Place 1 tablet (0.4 mg total) under the tongue every 5 (five) minutes as needed for chest pain. 01/25/14   Dixie Dials, MD  omeprazole (PRILOSEC) 40 MG capsule Take 1 capsule (40 mg total) by mouth 2 (two) times daily before a meal. 10/10/13   Debbe Odea, MD  predniSONE (DELTASONE) 20 MG tablet Take 1  tablet (20 mg total) by mouth daily with breakfast. 12/02/15   Hosie Poisson, MD  sildenafil (REVATIO) 20 MG tablet 2-5 pills as needed for ED symptoms Patient taking differently: Take 20 mg by mouth daily.  03/03/15   Renato Shin, MD  Vitamin D, Ergocalciferol, (DRISDOL) 50000 UNITS CAPS capsule Take 1 capsule (50,000 Units total) by mouth every 7 (seven) days. Patient taking differently: Take 50,000 Units by mouth every Wednesday.  01/27/14   Reyne Dumas, MD   There were no vitals taken for this visit. Physical Exam  Constitutional: He is oriented to person, place, and time. He appears well-developed and well-nourished.  HENT:  Head: Normocephalic and atraumatic.  Right Ear: External ear normal.  Left Ear: External ear normal.  Nose: Nose normal.  Eyes: Right eye exhibits no discharge. Left eye exhibits no discharge.  Neck: Neck supple.  Cardiovascular: Normal rate, regular rhythm, normal heart sounds and intact distal pulses.   Pulmonary/Chest: Effort normal and breath sounds normal. Tachypnea noted. No respiratory distress.  Audible expiratory wheezing from the patient's mouth but I hear no obvious wheezing in the lung fields.  Abdominal: Soft. There is no tenderness.  Musculoskeletal: He exhibits edema.  Neurological: He is alert and oriented to person, place, and time.  Skin: Skin is warm and dry. He is not diaphoretic.  Nursing note and vitals reviewed.   ED Course  Procedures (including critical care time) Labs Review Labs Reviewed  I-STAT CHEM 8, ED - Abnormal; Notable for the following:    Calcium, Ion 1.01 (*)    Hemoglobin 11.2 (*)    HCT 33.0 (*)    All other components within normal limits  APTT  CBC  COMPREHENSIVE METABOLIC PANEL  PROTIME-INR  I-STAT TROPOININ, ED    Imaging Review No results found. I have personally reviewed and evaluated these images and lab results as part of my medical decision-making.   EKG Interpretation   Date/Time:  Wednesday  December 08 2015 10:37:01 EST Ventricular Rate:  95 PR Interval:  110 QRS Duration: 98 QT Interval:  356 QTC Calculation: 447 R Axis:   12 Text Interpretation:  Sinus rhythm Borderline short PR interval  Anterolateral infarct, acute (LAD) Minimal ST elevation, inferior leads **  ** ACUTE MI / STEMI ** ** changes compared to Nov 27 2015 Confirmed by  Regenia Skeeter  MD, Gurleen Larrivee ([id number]) on 12/08/2015 10:42:34 AM      MDM   Final diagnoses:  ST elevation myocardial infarction (STEMI), unspecified artery ([place])    Patient's history is atypical but EKG shows new changes c/w STEMI. Patient endorses dyspnea but no signs of respiratory failure. Likely cardiac related. Dr. Terrence Dupont (his cardiologist) has been called and tells the charge nurse he will meet patient in cath lab. To be given aspirin and heparin.    Sherwood Gambler, MD 12/08/15 [phone]

## 2015-12-08 NOTE — Plan of Care (Signed)
Problem: Inadequate Gas Exchange Goal: Ability to maintain adequate oxygenation and ventilation will improve Outcome: Progressing xopenex nebs ordered prn wheezing.

## 2015-12-08 NOTE — Progress Notes (Signed)
Site area: Right groin a 6 french arterial sheath was removed  Site Prior to Removal:  Level 0  Pressure Applied For 15 MINUTES    Minutes Beginning at 1240p  Manual:   Yes.    Patient Status During Pull:  stable  Post Pull Groin Site:  Level 0  Post Pull Instructions Given:  Yes.    Post Pull Pulses Present:  Yes.    Dressing Applied:  Yes.    Comments:  VS remain stable during sheath pull

## 2015-12-08 NOTE — H&P (Signed)
[name] [name] is an 64 y.o. male.   Chief Complaint: Chest pain shortness of breath/EKG changes HPI: Patient is 64 year old male with past medical history significant for hypertension, history of congestive heart failure secondary to diastolic dysfunction, history of paroxysmal A. fib, hyperlipidemia, GERD, morbid obesity, history of COPD, osteoarthritis, came to office initially complaining of shortness of breath states he was recently discharged from the hospital ran out of his albuterol also complained of pleuritic chest pain patient EMS was called and the patient was referred to ED her EKG done in the ED showed normal sinus rhythm with diffuse ST elevation in anterolateral and minimal ST elevation in inferior leads her this was new as compared to EKG done approximately 10 days ago code STEMI was called patient was transferred to the Cath Lab. Patient seen in the Cath Lab complained of vague pleuritic chest pain associated with shortness of breath. Discussed with patient briefly regarding her new EKG changes and the left cardiac cath possible PTCA stenting his risk and benefits and consents for PCI  Past Medical History  Diagnosis Date  . Hypertension   . Asthma   . Atrial fibrillation ([place])   . CHF (congestive heart failure) ([place])   . Anemia   . Heart disease   . COPD (chronic obstructive pulmonary disease) [facility])     Past Surgical History  Procedure Laterality Date  . Esophagus surgery      Family History  Problem Relation Age of Onset  . Cancer Mother    Social History:  reports that he has never smoked. He has never used smokeless tobacco. He reports that he does not drink alcohol or use illicit drugs.  Allergies: No Known Allergies  Medications Prior to Admission  Medication Sig Dispense Refill  . albuterol (PROVENTIL HFA;VENTOLIN HFA) 108 (90 BASE) MCG/ACT inhaler Inhale 2 puffs into the lungs every 6 (six) hours as needed. 1 Inhaler 5  . albuterol (PROVENTIL) (2.5 MG/3ML)  0.083% nebulizer solution Take 3 mLs (2.5 mg total) by nebulization every 4 (four) hours as needed for wheezing or shortness of breath. (Patient not taking: Reported on 12/06/2015) 75 mL 5  . amiodarone (PACERONE) 200 MG tablet Take 1 tablet (200 mg total) by mouth daily. 60 tablet 2  . aspirin EC 81 MG tablet Take 81 mg by mouth daily.      . clomiPHENE (CLOMID) 50 MG tablet 1/2 tab daily. (Patient taking differently: Take 25 mg by mouth daily. 1/2 tab daily.) 15 tablet 11  . clotrimazole-betamethasone (LOTRISONE) cream Apply 1 application topically 2 (two) times daily. (Patient taking differently: Apply 1 application topically 2 (two) times daily as needed (FOR RASH). ) 30 g 1  . ferrous sulfate 325 (65 FE) MG tablet Take 325 mg by mouth daily with breakfast.    . fluticasone-salmeterol (ADVAIR HFA) 115-21 MCG/ACT inhaler Inhale 2 puffs into the lungs daily as needed. For shortness of breath 1 Inhaler 3  . levETIRAcetam (KEPPRA) 750 MG tablet Take 2 tablets (1,500 mg total) by mouth 2 (two) times daily. 120 tablet 11  . levofloxacin (LEVAQUIN) 750 MG tablet Take 1 tablet (750 mg total) by mouth daily. 3 tablet 0  . lisinopril (PRINIVIL,ZESTRIL) 20 MG tablet Take 20 mg by mouth daily.     . metoCLOPramide (REGLAN) 5 MG tablet Take 1 tablet (5 mg total) by mouth 4 (four) times daily. 120 tablet 0  . nitroGLYCERIN (NITROSTAT) 0.4 MG SL tablet Place 1 tablet (0.4 mg total) under the tongue  every 5 (five) minutes as needed for chest pain. 25 tablet 1  . omeprazole (PRILOSEC) 40 MG capsule Take 1 capsule (40 mg total) by mouth 2 (two) times daily before a meal. 60 capsule 3  . predniSONE (DELTASONE) 20 MG tablet Take 1 tablet (20 mg total) by mouth daily with breakfast. 3 tablet 0  . sildenafil (REVATIO) 20 MG tablet 2-5 pills as needed for ED symptoms (Patient taking differently: Take 20 mg by mouth daily. ) 50 tablet 10  . Vitamin D, Ergocalciferol, (DRISDOL) 50000 UNITS CAPS capsule Take 1 capsule  (50,000 Units total) by mouth every 7 (seven) days. (Patient taking differently: Take 50,000 Units by mouth every Wednesday. ) 10 capsule 2    Results for orders placed or performed during the hospital encounter of 12/08/15 (from the past 48 hour(s))  APTT     Status: None   Collection Time: 12/08/15 10:39 AM  Result Value Ref Range   aPTT 34 24 - 37 seconds  CBC     Status: Abnormal   Collection Time: 12/08/15 10:39 AM  Result Value Ref Range   WBC 22.5 (H) 4.0 - 10.5 K/uL   RBC 4.61 4.22 - 5.81 MIL/uL   Hemoglobin 9.7 (L) 13.0 - 17.0 g/dL   HCT 31.2 (L) 39.0 - 52.0 %   MCV 67.7 (L) 78.0 - 100.0 fL   MCH 21.0 (L) 26.0 - 34.0 pg   MCHC 31.1 30.0 - 36.0 g/dL   RDW 16.0 (H) 11.5 - 15.5 %   Platelets 437 (H) 150 - 400 K/uL  Comprehensive metabolic panel     Status: Abnormal   Collection Time: 12/08/15 10:39 AM  Result Value Ref Range   Sodium 137 135 - 145 mmol/L   Potassium 3.6 3.5 - 5.1 mmol/L   Chloride 109 101 - 111 mmol/L   CO2 20 (L) 22 - 32 mmol/L   Glucose, Bld 94 65 - 99 mg/dL   BUN 16 6 - 20 mg/dL   Creatinine, Ser 1.23 0.61 - 1.24 mg/dL   Calcium 7.8 (L) 8.9 - 10.3 mg/dL   Total Protein 7.0 6.5 - 8.1 g/dL   Albumin 2.5 (L) 3.5 - 5.0 g/dL   AST 31 15 - 41 U/L   ALT 48 17 - 63 U/L   Alkaline Phosphatase 103 38 - 126 U/L   Total Bilirubin 1.0 0.3 - 1.2 mg/dL   GFR calc non Af Amer 59 (L) >60 mL/min   GFR calc Af Amer >60 >60 mL/min    Comment: (NOTE) The eGFR has been calculated using the CKD EPI equation. This calculation has not been validated in all clinical situations. eGFR's persistently <60 mL/min signify possible Chronic Kidney Disease.    Anion gap 8 5 - 15  Protime-INR     Status: Abnormal   Collection Time: 12/08/15 10:39 AM  Result Value Ref Range   Prothrombin Time 16.1 (H) 11.6 - 15.2 seconds   INR 1.28 0.00 - 1.49  POCT i-Stat troponin I     Status: None   Collection Time: 12/08/15 10:45 AM  Result Value Ref Range   Troponin i, poc 0.00 0.00 -  0.08 ng/mL   Comment 3            Comment: Due to the release kinetics of cTnI, a negative result within the first hours of the onset of symptoms does not rule out myocardial infarction with certainty. If myocardial infarction is still suspected, repeat the test at appropriate intervals.  I-stat chem 8, ed     Status: Abnormal   Collection Time: 12/08/15 10:47 AM  Result Value Ref Range   Sodium 140 135 - 145 mmol/L   Potassium 3.7 3.5 - 5.1 mmol/L   Chloride 106 101 - 111 mmol/L   BUN 19 6 - 20 mg/dL   Creatinine, Ser 1.20 0.61 - 1.24 mg/dL   Glucose, Bld 90 65 - 99 mg/dL   Calcium, Ion 1.01 (L) 1.13 - 1.30 mmol/L   TCO2 23 0 - 100 mmol/L   Hemoglobin 11.2 (L) 13.0 - 17.0 g/dL   HCT 33.0 (L) 39.0 - 52.0 %   No results found.  Review of Systems  Constitutional: Negative for fever and chills.  Eyes: Positive for double vision.  Respiratory: Positive for shortness of breath and wheezing.   Cardiovascular: Positive for chest pain and leg swelling.  Gastrointestinal: Negative for nausea and vomiting.  Genitourinary: Negative for dysuria.  Neurological: Negative for dizziness and headaches.    SpO2 100 %. Physical Exam  Constitutional: He is oriented to person, place, and time.  HENT:  Head: Normocephalic and atraumatic.  Eyes: Conjunctivae are normal. Pupils are equal, round, and reactive to light. Left eye exhibits no discharge. No scleral icterus.  Neck: Normal range of motion. Neck supple. No JVD present. No tracheal deviation present. No thyromegaly present.  Cardiovascular: Normal rate and regular rhythm.   Murmur (Soft systolic murmur noted) heard. Respiratory:  Decreased breath sounds at bases with faint wheezing  GI: Soft. Bowel sounds are normal. He exhibits distension. There is no tenderness. There is no rebound.  Musculoskeletal:  No clubbing cyanosis 2+ edema  Neurological: He is alert and oriented to person, place, and time.     Assessment/Plan Atypical  chest pain with new EKG changes rule out STEMI Hypertension History of paroxysmal A. fib Compensated diastolic congestive heart failure COPD Morbid obesity GERD Osteoarthritis Mild volume overload Plan Discussed briefly the patient in cath lab regarding emergency left cardiac cath possible PTCA stenting its risk and benefits i.e. death MI stroke need for emergency CABG local vascular complications etc. and consents for PCI  Charolette Forward 12/08/2015, 11:42 AM

## 2015-12-08 NOTE — ED Notes (Signed)
Arrives via [facility] from cardiology clinic, ST elevation per ems, also c/o wheezing, SOB and weakness, speaking in full sentences and in NAD

## 2015-12-08 NOTE — Progress Notes (Signed)
   12/08/15 1041  Clinical Encounter Type  Visited With Health care provider  Visit Type Initial;Code   Chaplain responded to a code STEMI in the ED. According to EMS, patient came in by himself, and no family is present at this time. Chaplain support available as needed.   Jeri Lager, Chaplain 12/08/2015 10:42 AM

## 2015-12-08 NOTE — Telephone Encounter (Signed)
Patient returned nurse phone call.

## 2015-12-09 ENCOUNTER — Inpatient Hospital Stay (HOSPITAL_COMMUNITY): Payer: Commercial Managed Care - HMO

## 2015-12-09 DIAGNOSIS — I4892 Unspecified atrial flutter: Secondary | ICD-10-CM | POA: Diagnosis not present

## 2015-12-09 DIAGNOSIS — I110 Hypertensive heart disease with heart failure: Secondary | ICD-10-CM | POA: Diagnosis not present

## 2015-12-09 DIAGNOSIS — I959 Hypotension, unspecified: Secondary | ICD-10-CM | POA: Diagnosis not present

## 2015-12-09 DIAGNOSIS — J440 Chronic obstructive pulmonary disease with acute lower respiratory infection: Secondary | ICD-10-CM | POA: Diagnosis not present

## 2015-12-09 DIAGNOSIS — I301 Infective pericarditis: Secondary | ICD-10-CM | POA: Diagnosis not present

## 2015-12-09 DIAGNOSIS — I5032 Chronic diastolic (congestive) heart failure: Secondary | ICD-10-CM | POA: Diagnosis not present

## 2015-12-09 DIAGNOSIS — I480 Paroxysmal atrial fibrillation: Secondary | ICD-10-CM | POA: Diagnosis not present

## 2015-12-09 DIAGNOSIS — J189 Pneumonia, unspecified organism: Secondary | ICD-10-CM | POA: Diagnosis not present

## 2015-12-09 LAB — CBC
HCT: 32.8 % — ABNORMAL LOW (ref 39.0–52.0)
HEMOGLOBIN: 10.3 g/dL — AB (ref 13.0–17.0)
MCH: 21.3 pg — ABNORMAL LOW (ref 26.0–34.0)
MCHC: 31.4 g/dL (ref 30.0–36.0)
MCV: 67.8 fL — ABNORMAL LOW (ref 78.0–100.0)
Platelets: 407 10*3/uL — ABNORMAL HIGH (ref 150–400)
RBC: 4.84 MIL/uL (ref 4.22–5.81)
RDW: 16.1 % — AB (ref 11.5–15.5)
WBC: 25.9 10*3/uL — AB (ref 4.0–10.5)

## 2015-12-09 LAB — LIPID PANEL
CHOLESTEROL: 144 mg/dL (ref 0–200)
HDL: 57 mg/dL (ref >40)
LDL Cholesterol: 76 mg/dL (ref 0–99)
TRIGLYCERIDES: 53 mg/dL (ref <150)
Total CHOL/HDL Ratio: 2.5 RATIO
VLDL: 11 mg/dL (ref 0–40)

## 2015-12-09 LAB — TROPONIN I: TROPONIN I: 0.03 ng/mL (ref <0.031)

## 2015-12-09 LAB — BASIC METABOLIC PANEL
Anion gap: 8 (ref 5–15)
BUN: 17 mg/dL (ref 6–20)
CALCIUM: 8.6 mg/dL — AB (ref 8.9–10.3)
CO2: 27 mmol/L (ref 22–32)
Chloride: 101 mmol/L (ref 101–111)
Creatinine, Ser: 1.58 mg/dL — ABNORMAL HIGH (ref 0.61–1.24)
GFR calc Af Amer: 51 mL/min — ABNORMAL LOW (ref >60)
GFR, EST NON AFRICAN AMERICAN: 44 mL/min — AB (ref >60)
Glucose, Bld: 120 mg/dL — ABNORMAL HIGH (ref 65–99)
Potassium: 3.8 mmol/L (ref 3.5–5.1)
SODIUM: 136 mmol/L (ref 135–145)

## 2015-12-09 LAB — HEMOGLOBIN A1C
Hgb A1c MFr Bld: 5.8 % — ABNORMAL HIGH (ref 4.8–5.6)
MEAN PLASMA GLUCOSE: 120 mg/dL

## 2015-12-09 LAB — D-DIMER, QUANTITATIVE: D-Dimer, Quant: 3.07 ug/mL-FEU — ABNORMAL HIGH (ref 0.00–0.50)

## 2015-12-09 MED ORDER — TECHNETIUM TC 99M DIETHYLENETRIAME-PENTAACETIC ACID: 30.0000 | Freq: Once | INTRAVENOUS | Status: DC | PRN

## 2015-12-09 MED ORDER — TECHNETIUM TO 99M ALBUMIN AGGREGATED
4.0000 | Freq: Once | INTRAVENOUS | Status: AC | PRN
  Administered 2015-12-09: 4 via INTRAVENOUS

## 2015-12-09 MED ORDER — RIVAROXABAN 20 MG PO TABS
20.0000 mg | ORAL_TABLET | Freq: Every day | ORAL | Status: DC
  Administered 2015-12-09 – 2015-12-13 (×5): 20 mg via ORAL
  Filled 2015-12-09 (×5): qty 1

## 2015-12-09 MED ORDER — LEVOFLOXACIN 750 MG PO TABS
750.0000 mg | ORAL_TABLET | Freq: Every day | ORAL | Status: DC
  Administered 2015-12-09 – 2015-12-12 (×4): 750 mg via ORAL
  Filled 2015-12-09 (×4): qty 1

## 2015-12-09 NOTE — Care Management Note (Addendum)
Case Management Note  Patient Details  Name: [name] [name] MRN: [id number] Date of Birth: April 06, 1951  Subjective/Objective: Pt admitted for chest pain. Plan for d/c home on Xarelto. Benefits check completed: S/W JESSICA @ [employer] RX # [phone]   XARELTO 20 MG DAILY FOR 30 DAYS   COVER-YES  CO- PAY- $3.60 QUANTITY LIMETED  TIER- 3 DRUG  PRIOR APPROVAL - NO -IF PATIENT GO OVER 30 PILLS WILL NEED PRIOR APPROVAL  PHARMACY : ANY RETAIL    RIVAROX[facility]AN- NOT ON FORMULARY               Action/Plan:  CM will provide pt with 30 day free card. Pt will need Rx for 30 day free no refills. No further needs from CM at this time.     Expected Discharge Date:                  Expected Discharge Plan: Home with [place] In-House Referral:  NA  Discharge planning Services  CM Consult  Post Acute Care Choice: Home Health Choice offered [id number]  DME Arranged: Nebulizer DME Agency:  [facility] HH Arranged: Registered Nurse and Physical Therapy HH Agency:  [place] Status of Service:  Completed, signed off  Medicare Important Message Given:    Date Medicare IM Given:    Medicare IM give by:    Date Additional Medicare IM Given:    Additional Medicare Important Message give by:     If discussed at [place] of Stay Meetings, dates discussed:    Additional Comments: 12-14-15 1115 Jacqlyn Krauss, RN,BSN [phone] Pt continues on IV antibiotic therapy. Plan will be home once stable with [facility] RN and PT services with [facility]. Will continue to monitor.   12-10-15 [address], RN,BSN [phone] CM did provide pt with scale and nebulizer was delivered to room. CM did discuss [place] [facility] with pt and he is agreeable. Pt is still unsure about RN Services at this time. CM did make referral for both with [facility]. SOC to begin within 24-48 hrs post d/c.Per PT notes pt may need 02 before d/c.  Pt will need HH orders once stable for d/c. No further needs at this time.     12-10-15 1140 Jacqlyn Krauss, [place] [phone] Pt has PCP at the [facility]. TCC  Liaison reached out to CM for possible TCC candidate. CM did discuss with pt in regards to [facility] RN needs. At this time pt is refusing [facility] Services,However, CM did suggest that if things change once he gets home to go through PCP for orders. CM did place orders for Nebulizer Machine via [facility]. DME to be delivered to room before d/c. CM will try to get a scale for pt before he d/c home.  Bethena Roys, RN 12/09/2015, 2:05 PM

## 2015-12-09 NOTE — Progress Notes (Signed)
Pt converted to NSR & an EKG confirmed it. RN asked pt if he was in any pain & pt stated he was okay just sore all over. Pt's stated his main concern is his breathing. RN will page RT for a prn breathing treatment. Hoover Brunette, RN

## 2015-12-09 NOTE — Progress Notes (Signed)
Pt very SOB, 02 sat upper 80s low 90s, PRN xopenex given with improvement noted, will continue to monitor closely.  Edward Qualia RN

## 2015-12-09 NOTE — Progress Notes (Signed)
UR Completed Phillips Goulette Graves-Bigelow, RN,BSN [phone]  

## 2015-12-09 NOTE — Progress Notes (Signed)
Pt complaining of chest tightness, one nitro sublingual given with verbalized relief, will continue to monitor closely.  Edward Qualia RN

## 2015-12-09 NOTE — Telephone Encounter (Signed)
Nurse called patient, patient verified date of birth. Patient reports he needs a nebulizer. He has never had one. Patient currently in hospital, patient was in hospital last week also. Patient called for hospital follow up. Patient was given hospital follow up appointment two weeks out.  Patient has appointment 12/14/15 with Dr. Jarold Song.  Patient agrees to call if he is not out of the hospital on 12/13/15 to reschedule hospital follow up appointment.

## 2015-12-09 NOTE — Progress Notes (Signed)
Subjective:  States feels better breathing has improved no anginal chest pain. White count remains elevated glucose likely due to steroids.  Objective:  Vital Signs in the last 24 hours: Temp:  [97.2 F (36.2 C)-99.1 F (37.3 C)] 98.5 F (36.9 C) (12/15 0437) Pulse Rate:  [87-107] 87 (12/15 0437) Resp:  [22-35] 22 (12/15 0437) BP: (101-142)/(59-92) 101/59 mmHg (12/15 0437) SpO2:  [97 %-100 %] 100 % (12/15 0853) Weight:  [237 lb 9.6 oz (107.775 kg)] 237 lb 9.6 oz (107.775 kg) (12/15 0437)  Intake/Output from previous day: 12/14 0701 - 12/15 0700 In: 793 [P.O.:120; I.V.:673] Out: 780 [Urine:780] Intake/Output from this shift:    Physical Exam: Neck: no adenopathy, no carotid bruit, no JVD and supple, symmetrical, trachea midline Lungs: Decreased breath sound at bases Heart: Decreased breath sound at bases Abdomen: soft, non-tender; bowel sounds normal; no masses,  no organomegaly Extremities: extremities normal, atraumatic, no cyanosis or edema and Right groin stable  Lab Results:  Recent Labs  12/08/15 1530 12/09/15 0133  WBC 26.8* 25.9*  HGB 10.9* 10.3*  PLT 469* 407*    Recent Labs  12/08/15 1530 12/09/15 0133  NA 135 136  K 3.6 3.8  CL 100* 101  CO2 25 27  GLUCOSE 99 120*  BUN 17 17  CREATININE 1.44* 1.58*    Recent Labs  12/08/15 1530 12/09/15 0133  TROPONINI <0.03 0.03   Hepatic Function Panel  Recent Labs  12/08/15 1530  PROT 8.1  ALBUMIN 2.8*  AST 35  ALT 52  ALKPHOS 127*  BILITOT 1.6*    Recent Labs  12/09/15 0133  CHOL 144   No results for input(s): PROTIME in the last 72 hours.  Imaging: Imaging results have been reviewed and No results found.  Cardiac Studies:  Assessment/Plan:  Status post atypical chest pain MI ruled out status post left cath Status post A. fib with RVR converted back to sinus rhythm Marked leukocytosis probably secondary to steroids rule out pneumonia  Compensated diastolic congestive heart  failure COPD Morbid obesity GERD Osteoarthritis Plan Start Xarelto per pharmacy protocol Monitor renal function in a.m. OT PT consult Check labs in a.m.  LOS: 1 day    Charolette Forward 12/09/2015, 12:37 PM

## 2015-12-09 NOTE — Progress Notes (Signed)
ANTICOAGULATION CONSULT NOTE - Initial Consult  Pharmacy Consult for Xarelto Indication: atrial fibrillation  No Known Allergies  Patient Measurements: Weight: 237 lb 9.6 oz (107.775 kg)  Vital Signs: Temp: 98.5 F (36.9 C) (12/15 0437) Temp Source: Oral (12/15 0437) BP: 101/59 mmHg (12/15 0437) Pulse Rate: 87 (12/15 0437)  Labs:  Recent Labs  12/08/15 1039 12/08/15 1047 12/08/15 1530 12/09/15 0133  HGB 9.7* 11.2* 10.9* 10.3*  HCT 31.2* 33.0* 34.7* 32.8*  PLT 437*  --  469* 407*  APTT 34  --   --   --   LABPROT 16.1*  --   --   --   INR 1.28  --   --   --   CREATININE 1.23 1.20 1.44* 1.58*  TROPONINI  --   --  <0.03 0.03    Estimated Creatinine Clearance: 53.9 mL/min (by C-G formula based on Cr of 1.58).   Medical History: Past Medical History  Diagnosis Date  . Hypertension   . Asthma   . Atrial fibrillation ([place])   . CHF (congestive heart failure) ([place])   . Anemia   . Heart disease   . COPD (chronic obstructive pulmonary disease) ([facility])     Medications:  Scheduled:  . amiodarone  400 mg Oral BID  . aspirin  324 mg Oral NOW   Or  . aspirin  300 mg Rectal NOW  . aspirin EC  81 mg Oral Daily  . atorvastatin  40 mg Oral q1800  . ferrous sulfate  325 mg Oral Q breakfast  . Influenza vac split quadrivalent PF  0.5 mL Intramuscular Tomorrow-1000  . levETIRAcetam  1,500 mg Oral BID  . metoprolol tartrate  12.5 mg Oral BID  . mometasone-formoterol  2 puff Inhalation BID  . pantoprazole  40 mg Oral Q1200  . sodium chloride  3 mL Intravenous Q12H  . sodium chloride  3 mL Intravenous Q12H    Assessment: 64 y.o. male with Afib for Xarelto   Plan:  Xarelto 20 mg daily  Kimari Lienhard, [place] Curb 12/09/2015,7:32 AM

## 2015-12-09 NOTE — Consult Note (Signed)
   [facility] Inpatient Consult   12/09/2015  [name] [name] May 02, 1951 [id number] Patient is currently active [up to [place] with [place] [facility] for chronic disease management services.  Patient has been engaged by a [employer].  Will follow for patient's progress and ongoing care management needs.   Patient will receive a post discharge transition of care call and will be evaluated for monthly home visits for assessments and disease process education.  Will make  Inpatient Case Manager aware that [facility] [place] [facility] following. Of note, [facility] Care Management services does not replace or interfere with any services that are needed or arranged by inpatient case management or social work.  For additional questions or referrals please contact: Natividad Brood, RN BSN [facility] Liaison  [phone] business mobile phone

## 2015-12-10 ENCOUNTER — Inpatient Hospital Stay (HOSPITAL_COMMUNITY): Payer: Commercial Managed Care - HMO

## 2015-12-10 ENCOUNTER — Telehealth: Payer: Self-pay | Admitting: Family Medicine

## 2015-12-10 LAB — BASIC METABOLIC PANEL
ANION GAP: 9 (ref 5–15)
BUN: 23 mg/dL — AB (ref 6–20)
CALCIUM: 8.2 mg/dL — AB (ref 8.9–10.3)
CO2: 26 mmol/L (ref 22–32)
Chloride: 101 mmol/L (ref 101–111)
Creatinine, Ser: 1.38 mg/dL — ABNORMAL HIGH (ref 0.61–1.24)
GFR calc Af Amer: 60 mL/min — ABNORMAL LOW (ref >60)
GFR, EST NON AFRICAN AMERICAN: 52 mL/min — AB (ref >60)
Glucose, Bld: 88 mg/dL (ref 65–99)
POTASSIUM: 3.9 mmol/L (ref 3.5–5.1)
SODIUM: 136 mmol/L (ref 135–145)

## 2015-12-10 LAB — CBC
HCT: 28.2 % — ABNORMAL LOW (ref 39.0–52.0)
Hemoglobin: 8.7 g/dL — ABNORMAL LOW (ref 13.0–17.0)
MCH: 20.9 pg — ABNORMAL LOW (ref 26.0–34.0)
MCHC: 30.9 g/dL (ref 30.0–36.0)
MCV: 67.6 fL — ABNORMAL LOW (ref 78.0–100.0)
PLATELETS: 370 10*3/uL (ref 150–400)
RBC: 4.17 MIL/uL — AB (ref 4.22–5.81)
RDW: 16 % — AB (ref 11.5–15.5)
WBC: 19.6 10*3/uL — AB (ref 4.0–10.5)

## 2015-12-10 MED ORDER — SENNOSIDES-DOCUSATE SODIUM 8.6-50 MG PO TABS
1.0000 | ORAL_TABLET | Freq: Two times a day (BID) | ORAL | Status: DC
  Administered 2015-12-10 – 2015-12-18 (×16): 1 via ORAL
  Filled 2015-12-10 (×16): qty 1

## 2015-12-10 MED ORDER — LEVALBUTEROL HCL 1.25 MG/0.5ML IN NEBU
1.2500 mg | INHALATION_SOLUTION | Freq: Three times a day (TID) | RESPIRATORY_TRACT | Status: DC
  Administered 2015-12-11 – 2015-12-18 (×22): 1.25 mg via RESPIRATORY_TRACT
  Filled 2015-12-10 (×23): qty 0.5

## 2015-12-10 MED ORDER — AMIODARONE HCL 200 MG PO TABS
200.0000 mg | ORAL_TABLET | Freq: Two times a day (BID) | ORAL | Status: DC
  Administered 2015-12-10 – 2015-12-14 (×9): 200 mg via ORAL
  Filled 2015-12-10 (×9): qty 1

## 2015-12-10 MED ORDER — LEVALBUTEROL HCL 1.25 MG/0.5ML IN NEBU
1.2500 mg | INHALATION_SOLUTION | Freq: Four times a day (QID) | RESPIRATORY_TRACT | Status: DC
  Administered 2015-12-10 (×3): 1.25 mg via RESPIRATORY_TRACT
  Filled 2015-12-10 (×3): qty 0.5

## 2015-12-10 MED ORDER — IPRATROPIUM BROMIDE 0.02 % IN SOLN
0.5000 mg | Freq: Three times a day (TID) | RESPIRATORY_TRACT | Status: DC
  Administered 2015-12-11 – 2015-12-18 (×22): 0.5 mg via RESPIRATORY_TRACT
  Filled 2015-12-10 (×23): qty 2.5

## 2015-12-10 MED ORDER — FUROSEMIDE 10 MG/ML IJ SOLN
40.0000 mg | Freq: Once | INTRAMUSCULAR | Status: AC
  Administered 2015-12-10: 40 mg via INTRAVENOUS
  Filled 2015-12-10: qty 4

## 2015-12-10 NOTE — Evaluation (Signed)
Occupational Therapy Evaluation Patient Details Name: [name] [name] MRN: [id number] DOB: 1951-05-13 Today's Date: 12/10/2015    History of Present Illness 64 yo male with onset of PNA with LLL infiltrate was admitted, has leukocytosis, cleared for MI, on testing and has contact precautions   Clinical Impression   Pt was independent prior to most recent hospital admission.  Presents with generalized weakness, poor activity tolerance, and impaired balance interfering with ability to perform mobility and ADL.  Pt lives alone.  Pt currently requiring max assist for LB bathing and dressing, min assist for transfers and mod assist for bed mobility. He lives alone.  Recommend ST rehab in SNF prior to return home.  Pt is hopeful to be able to return home. Will follow acutely.    Follow Up Recommendations  SNF (may progress to be able to return home)    Equipment Recommendations  3 in 1 bedside comode    Recommendations for Other Services       Precautions / Restrictions Precautions Precautions: Fall Precaution Comments: MRSA, watch sats Restrictions Weight Bearing Restrictions: No      Mobility Bed Mobility Overal bed mobility: Needs Assistance Bed Mobility: Supine to Sit     Supine to sit: Mod assist     General bed mobility comments: assist for B LEs up into bed  Transfers Overall transfer level: Needs assistance Equipment used: Rolling walker (2 wheeled) Transfers: Sit to/from [facility] Sit to Stand: Min assist Stand pivot transfers: Min guard       General transfer comment: min assist to power up and control descent, verbal cues for hand placement with walker use    Balance Overall balance assessment: Needs assistance Sitting-balance support: Feet supported Sitting balance-Leahy Scale: Good   Postural control: Posterior lean Standing balance support: Bilateral upper extremity supported Standing balance-Leahy Scale: Fair                               ADL Overall ADL's : Needs assistance/impaired Eating/Feeding: Independent;Sitting   Grooming: Wash/dry hands;Wash/dry face;Sitting;Set up   Upper Body Bathing: Minimal assitance;Sitting   Lower Body Bathing: Maximal assistance;Sit to/from stand   Upper Body Dressing : Set up;Sitting   Lower Body Dressing: Maximal assistance;Sit to/from stand   Toilet Transfer: Minimal assistance;Stand-pivot;BSC   Toileting- Clothing Manipulation and Hygiene: Maximal assistance;Sit to/from stand         General ADL Comments: Pt with wheezing and easily SOB requiring several minutes to recover. Instructed in purse lip breathing.     Vision     Perception     Praxis      Pertinent Vitals/Pain Pain Assessment: Faces Faces Pain Scale: Hurts little more Pain Location: L upper chest Pain Descriptors / Indicators: Sore Pain Intervention(s): Monitored during session;Repositioned     Hand Dominance Right   Extremity/Trunk Assessment Upper Extremity Assessment Upper Extremity Assessment: Generalized weakness   Lower Extremity Assessment Lower Extremity Assessment: Defer to PT evaluation   Cervical / Trunk Assessment Cervical / Trunk Assessment: Normal   Communication Communication Communication: No difficulties   Cognition Arousal/Alertness: Awake/alert Behavior During Therapy: WFL for tasks assessed/performed Overall Cognitive Status: Within Functional Limits for tasks assessed                     General Comments       Exercises       Shoulder Instructions      Home Living Family/patient  expects to be discharged to:: Private residence Living Arrangements: Alone Available Help at Discharge: Family;Available PRN/intermittently Type of Home: House Home Access: Level entry     Home Layout: Two level;Able to live on main level with bedroom/bathroom     Bathroom Shower/Tub: Teacher, early years/pre: Standard Bathroom  Accessibility: No   Home Equipment: None   Additional Comments: has been working and completely independent      Prior Functioning/Environment Level of Independence: Independent             OT Diagnosis: Generalized weakness;Acute pain   OT Problem List: Decreased strength;Decreased activity tolerance;Impaired balance (sitting and/or standing);Decreased knowledge of use of DME or AE;Cardiopulmonary status limiting activity;Obesity;Pain   OT Treatment/Interventions: Self-care/ADL training;Energy conservation;DME and/or AE instruction;Patient/family education;Therapeutic activities    OT Goals(Current goals can be found in the care plan section) Acute Rehab OT Goals Patient Stated Goal: to get home and feel better OT Goal Formulation: With patient Time For Goal Achievement: 12/24/15 Potential to Achieve Goals: Good ADL Goals Pt Will Perform Grooming: with modified independence;standing (2 activities) Pt Will Perform Lower Body Bathing: with modified independence;with adaptive equipment;sit to/from stand Pt Will Perform Lower Body Dressing: with modified independence;with adaptive equipment;sit to/from stand Pt Will Transfer to Toilet: with modified independence;ambulating;bedside commode (over toilet as needed) Pt Will Perform Toileting - Clothing Manipulation and hygiene: with modified independence;sit to/from stand Pt Will Perform Tub/Shower Transfer: Tub transfer;with modified independence;ambulating;3 in 1 Additional ADL Goal #1: Pt will utilize energy conservation strategies and breathing techniques in ADL and mobility.  OT Frequency: Min 2X/week   Barriers to D/C: Decreased caregiver support          Co-evaluation              End of Session Equipment Utilized During Treatment: Rolling walker;Gait belt;Oxygen (.5 L)  Activity Tolerance: Patient limited by fatigue Patient left: in bed;with call bell/phone within reach (with vascular lab tech for doppler)    Time: [id number] OT Time Calculation (min): 24 min Charges:  OT General Charges $OT Visit: 1 Procedure OT Evaluation $Initial OT Evaluation Tier I: 1 Procedure OT Treatments $Self Care/Home Management : 8-22 mins G-Codes:    Malka So 12/10/2015, 2:40 PM  [phone]

## 2015-12-10 NOTE — Telephone Encounter (Signed)
Pemelope from [facility] called to let pt. PCP know that he was admitted to the Hospital. Please f/u.

## 2015-12-10 NOTE — Progress Notes (Signed)
Subjective:  Up in chair states breathing has improved. Denies any fever or chills. White count trending down hemoglobin also trending down denies any obvious bleeding no BM yet. D-dimer mildly elevated at VQ scan which was negative for PE a second x-ray showed left lower lobe infiltrate with small effusion.  Objective:  Vital Signs in the last 24 hours: Temp:  [98.4 F (36.9 C)-98.9 F (37.2 C)] 98.4 F (36.9 C) (12/16 0424) Pulse Rate:  [73-90] 78 (12/16 0432) Resp:  [20-45] 31 (12/16 0432) BP: (103-122)/(54-74) 103/54 mmHg (12/16 0424) SpO2:  [98 %-100 %] 98 % (12/16 0440) Weight:  [241 lb 3.2 oz (109.408 kg)] 241 lb 3.2 oz (109.408 kg) (12/16 0424)  Intake/Output from previous day: 12/15 0701 - 12/16 0700 In: 480 [P.O.:480] Out: 650 [Urine:650] Intake/Output from this shift:    Physical Exam: Neck: no adenopathy, no carotid bruit, no JVD and supple, symmetrical, trachea midline Lungs: Decreased breath sound at bases with occasional left lower lobe rhonchi Heart: regular rate and rhythm, S1, S2 normal and Soft systolic murmur noted Abdomen: soft, non-tender; bowel sounds normal; no masses,  no organomegaly Extremities: No clubbing cyanosis trace edema noted  Lab Results:  Recent Labs  12/09/15 0133 12/10/15 0410  WBC 25.9* 19.6*  HGB 10.3* 8.7*  PLT 407* 370    Recent Labs  12/09/15 0133 12/10/15 0410  NA 136 136  K 3.8 3.9  CL 101 101  CO2 27 26  GLUCOSE 120* 88  BUN 17 23*  CREATININE 1.58* 1.38*    Recent Labs  12/08/15 1530 12/09/15 0133  TROPONINI <0.03 0.03   Hepatic Function Panel  Recent Labs  12/08/15 1530  PROT 8.1  ALBUMIN 2.8*  AST 35  ALT 52  ALKPHOS 127*  BILITOT 1.6*    Recent Labs  12/09/15 0133  CHOL 144   No results for input(s): PROTIME in the last 72 hours.  Imaging: Imaging results have been reviewed and Nm Pulmonary Perf And Vent  12/09/2015  CLINICAL DATA:  COPD and asthma exacerbation. EXAM: NUCLEAR MEDICINE  VENTILATION - PERFUSION LUNG SCAN TECHNIQUE: Ventilation images were obtained in multiple projections using inhaled aerosol Tc-59m DTPA. Perfusion images were obtained in multiple projections after intravenous injection of Tc-10m MAA. RADIOPHARMACEUTICALS:  30.2 mCi Technetium-3m DTPA aerosol inhalation and 4.1 mCi Technetium-30m MAA IV COMPARISON:  None. FINDINGS: Ventilation: There is a single medium size segmental ventilation defect within the left upper lobe. Perfusion: There is a single medium size segmental perfusion defect identified within the left upper lobe which corresponds to the ventilation abnormality. IMPRESSION: Low probability for acute pulmonary embolus. Electronically Signed   By: Kerby Moors M.D.   On: 12/09/2015 16:38   Dg Chest Port 1 View  12/09/2015  CLINICAL DATA:  Dyspnea for 3 weeks. EXAM: PORTABLE CHEST 1 VIEW COMPARISON:  December 01, 2015. FINDINGS: Stable cardiomegaly. No pneumothorax is noted. Patient is rotated to the right. Minimal right basilar subsegmental atelectasis is noted. Stable left basilar opacity is noted concerning for atelectasis or pneumonia with associated pleural effusion. Bony thorax is unremarkable. IMPRESSION: Stable left basilar opacity concerning for atelectasis or pneumonia with associated pleural effusion. Electronically Signed   By: Marijo Conception, M.D.   On: 12/09/2015 14:41    Cardiac Studies:  Assessment/Plan:  Status post atypical chest pain MI ruled out status post left cath Status post A. fib with RVR converted back to sinus rhythm Left lower lobe pneumonia Marked leukocytosis secondary to steroids doubt empyema Acute  on chronic anemia rule out GI loss Compensated diastolic congestive heart failure COPD Morbid obesity GERD Osteoarthritis Plan Continue present management Check stool for occult blood Rx for constipation Dr. Doylene Canard on call for weekend. Reduce amiodarone as per orders Increase ambulation  LOS: 2 days     Charolette Forward 12/10/2015, 8:01 AM

## 2015-12-10 NOTE — Progress Notes (Signed)
VASCULAR LAB PRELIMINARY  PRELIMINARY  PRELIMINARY  PRELIMINARY  Bilateral lower extremity venous duplexcompleted.    Preliminary report. Bilateral:  No evidence of DVT, superficial thrombosis, or Baker's Cyst.   Janifer Adie, RVT,RDMS 12/10/2015, 3:28 PM

## 2015-12-10 NOTE — Plan of Care (Signed)
Problem: Inadequate Gas Exchange Goal: Ability to maintain adequate oxygenation and ventilation will improve Outcome: Progressing When the client does any type of movement including in and out of bed he gets very SOB, wheezing, but didn't destat on 1 liter nasal cannula. At the beginning of the shift he was on 5L of O2 and we are now only on 1L.

## 2015-12-10 NOTE — Hospital Discharge Follow-Up (Signed)
Transitional Care Clinic Care Coordination Note:  Admit date:  12/08/15 Discharge date: TBD Discharge Disposition: Home when stable Patient contact: [phone] (cell) Emergency contact(s): none  This Case Manager reviewed patient's EMR and determined patient would benefit from post-discharge medical management and chronic care management services through the [facility]. Patient has a history of HTN, atrial fibrillation, CHF, COPD. This Case Manager met with patient to discuss the services and medical management that can be provided at the [facility]. Patient verbalized understanding and agreed to receive post-discharge care at the [facility].   Patient scheduled for Transitional Care appointment on 12/14/15 at 1030 with Dr. Jarold Song.  Clinic information and appointment time provided to patient. Appointment information also placed on AVS.  Assessment:       Home Environment: Patient lives alone in a one level condo.       Support System: friends       Level of functioning: Independent       Home DME: none prior to admission.  Patient given a home nebulizer this admission. Jacqlyn Krauss, RN CM also indicated she planned to get patient a scale.       Home care services: none       Transportation:  Patient typically drives to his medical appointments. Informed patient of transportation resources available with the [facility] if he is ever unable to drive to his appointments. Patient appreciative of information.        Food/Nutrition: Patient shops for and prepares his own meals. He indicated he has access to needed food.        Medications: Patient indicated he uses the pharmacy at [place] or [facility] [address]. He denied problems obtaining or affording his medications.        Identified Barriers: lack of PCP        PCP: none (previously Dr. Annitta Needs at [facility]). Will need  to establish care at [place] after 30 days of medical management with the [facility].   Arranged services:        Services communicated to Jacqlyn Krauss, RN CM

## 2015-12-10 NOTE — Evaluation (Addendum)
Physical Therapy Evaluation Patient Details Name: [name] [name] MRN: [id number] DOB: 1951-10-25 Today's Date: 12/10/2015   History of Present Illness  63 yo male with onset of PNA with LLL infiltrate was admitted, has leukocytosis, cleared for MI, on testing and has contact precautions  Clinical Impression  Pt was able to get up to walk with PT but had a decline in O2 sats with effort.  At beginning was 100% but after walking for 15' x 4 with standing talking was down to 87%.  His sats fluctuated on .5 L O2 and discussed with nursing and case manager afterward    Follow Up Recommendations SNF    Equipment Recommendations  Rolling walker with 5" wheels    Recommendations for Other Services       Precautions / Restrictions Precautions Precautions: Fall Precaution Comments: MRSA, watch sats Restrictions Weight Bearing Restrictions: No      Mobility  Bed Mobility               General bed mobility comments: up when PT entered  Transfers Overall transfer level: Needs assistance Equipment used: Rolling walker (2 wheeled);1 person hand held assist Transfers: Sit to/from [facility] Sit to Stand: Min assist Stand pivot transfers: Min guard       General transfer comment: min assist to power up and control descent  Ambulation/Gait Ambulation/Gait assistance: Min guard Ambulation Distance (Feet): 15 Feet (x4) Assistive device: Rolling walker (2 wheeled);1 person hand held assist Gait Pattern/deviations: Step-through pattern;Step-to pattern;Trunk flexed;Wide base of support (tends to let walker get away from him) Gait velocity: slow, halting Gait velocity interpretation: Below normal speed for age/gender    Stairs            Wheelchair Mobility    Modified Rankin (Stroke Patients Only)       Balance Overall balance assessment: Needs assistance Sitting-balance support: Feet supported Sitting balance-Leahy Scale: Good   Postural  control: Posterior lean Standing balance support: Bilateral upper extremity supported Standing balance-Leahy Scale: Fair                               Pertinent Vitals/Pain Pain Assessment: Faces    Home Living Family/patient expects to be discharged to:: Private residence Living Arrangements: Alone Available Help at Discharge: Family;Available PRN/intermittently Type of Home: House Home Access: Level entry     Home Layout: Two level;Able to live on main level with bedroom/bathroom Home Equipment: None Additional Comments: has been working and completely independent    Prior Function Level of Independence: Independent               Hand Dominance   Dominant Hand: Right    Extremity/Trunk Assessment   Upper Extremity Assessment: Overall WFL for tasks assessed           Lower Extremity Assessment: Generalized weakness      Cervical / Trunk Assessment: Normal  Communication   Communication: No difficulties  Cognition Arousal/Alertness: Awake/alert Behavior During Therapy: WFL for tasks assessed/performed Overall Cognitive Status: Within Functional Limits for tasks assessed                      General Comments General comments (skin integrity, edema, etc.): Has been independent and now in hospital for last two weeks with increasing fatigue adn loss of strength.  Will expect him to need considerable therapy to recover which pt supports    Exercises  Assessment/Plan    PT Assessment Patient needs continued PT services  PT Diagnosis Generalized weakness   PT Problem List Decreased strength;Decreased range of motion;Decreased activity tolerance;Decreased balance;Decreased mobility;Decreased coordination;Decreased knowledge of use of DME;Decreased safety awareness;Decreased knowledge of precautions;Cardiopulmonary status limiting activity  PT Treatment Interventions DME instruction;Gait training;Functional mobility  training;Therapeutic activities;Therapeutic exercise;Balance training;Neuromuscular re-education;Patient/family education   PT Goals (Current goals can be found in the Care Plan section) Acute Rehab PT Goals Patient Stated Goal: to get home and feel better PT Goal Formulation: With patient Time For Goal Achievement: 12/24/15 Potential to Achieve Goals: Good    Frequency Min 3X/week   Barriers to discharge Inaccessible home environment;Decreased caregiver support      Co-evaluation               End of Session Equipment Utilized During Treatment: Gait belt;Oxygen Activity Tolerance: Patient tolerated treatment well Patient left: in chair;with call bell/phone within reach Nurse Communication: Mobility status         Time: [id number] PT Time Calculation (min) (ACUTE ONLY): 31 min   Charges:   PT Evaluation $Initial PT Evaluation Tier I: 1 Procedure PT Treatments $Gait Training: 8-22 mins   PT G Codes:        Ramond Dial 12/24/2015, 2:30 PM   Mee Hives, PT MS Acute Rehab Dept. Number: [phone] [phone] and [place] [phone]

## 2015-12-10 NOTE — Care Management Important Message (Signed)
Important Message  Patient Details  Name: [name] [name] MRN: [id number] Date of Birth: Apr 23, 1951   Medicare Important Message Given:  Yes    Bethena Roys, RN 12/10/2015, 11:04 AM

## 2015-12-11 LAB — BASIC METABOLIC PANEL
ANION GAP: 7 (ref 5–15)
BUN: 24 mg/dL — ABNORMAL HIGH (ref 6–20)
CALCIUM: 8.4 mg/dL — AB (ref 8.9–10.3)
CO2: 27 mmol/L (ref 22–32)
Chloride: 103 mmol/L (ref 101–111)
Creatinine, Ser: 1.33 mg/dL — ABNORMAL HIGH (ref 0.61–1.24)
GFR, EST NON AFRICAN AMERICAN: 54 mL/min — AB (ref >60)
Glucose, Bld: 100 mg/dL — ABNORMAL HIGH (ref 65–99)
POTASSIUM: 3.9 mmol/L (ref 3.5–5.1)
Sodium: 137 mmol/L (ref 135–145)

## 2015-12-11 LAB — CBC
HCT: 30.2 % — ABNORMAL LOW (ref 39.0–52.0)
HEMOGLOBIN: 9.7 g/dL — AB (ref 13.0–17.0)
MCH: 21.5 pg — ABNORMAL LOW (ref 26.0–34.0)
MCHC: 32.1 g/dL (ref 30.0–36.0)
MCV: 67 fL — ABNORMAL LOW (ref 78.0–100.0)
Platelets: 407 10*3/uL — ABNORMAL HIGH (ref 150–400)
RBC: 4.51 MIL/uL (ref 4.22–5.81)
RDW: 15.8 % — ABNORMAL HIGH (ref 11.5–15.5)
WBC: 19.9 10*3/uL — ABNORMAL HIGH (ref 4.0–10.5)

## 2015-12-11 NOTE — Progress Notes (Signed)
Ref: Lorayne Marek, MD   Subjective:  Feels tired but better than before. T max 99.7 degree F.  Objective:  Vital Signs in the last 24 hours: Temp:  [98.7 F (37.1 C)-99.7 F (37.6 C)] 98.7 F (37.1 C) (12/17 0529) Pulse Rate:  [76-92] 76 (12/17 0529) Cardiac Rhythm:  [-] Normal sinus rhythm;Bundle branch block (12/17 0900) Resp:  [17-20] 18 (12/17 0529) BP: (105-120)/(62-68) 105/67 mmHg (12/17 0529) SpO2:  [97 %-100 %] 100 % (12/17 0904)  Physical Exam: BP Readings from Last 1 Encounters:  12/11/15 105/67    Wt Readings from Last 1 Encounters:  12/10/15 109.408 kg (241 lb 3.2 oz)    Weight change:   HEENT: [place]/AT, Eyes-Brown, Conjunctiva-Pink, Sclera-Non-icteric Neck: No JVD, No bruit, Trachea midline. Lungs:  Bilateral fine crackles. Cardiac:  Regular rhythm, normal S1 and S2, no S3. II/VI systolic murmur. Abdomen:  Soft, non-tender. Extremities:  No edema present. No cyanosis. No clubbing. CNS: AxOx3, Cranial nerves grossly intact, moves all 4 extremities. Right handed. Skin: Warm and dry.   Intake/Output from previous day: 12/16 0701 - 12/17 0700 In: 123 [P.O.:120; I.V.:3] Out: 650 [Urine:650]    Lab Results: BMET    Component Value Date/Time   NA 137 12/11/2015 0205   NA 136 12/10/2015 0410   NA 136 12/09/2015 0133   K 3.9 12/11/2015 0205   K 3.9 12/10/2015 0410   K 3.8 12/09/2015 0133   CL 103 12/11/2015 0205   CL 101 12/10/2015 0410   CL 101 12/09/2015 0133   CO2 27 12/11/2015 0205   CO2 26 12/10/2015 0410   CO2 27 12/09/2015 0133   GLUCOSE 100* 12/11/2015 0205   GLUCOSE 88 12/10/2015 0410   GLUCOSE 120* 12/09/2015 0133   BUN 24* 12/11/2015 0205   BUN 23* 12/10/2015 0410   BUN 17 12/09/2015 0133   CREATININE 1.33* 12/11/2015 0205   CREATININE 1.38* 12/10/2015 0410   CREATININE 1.58* 12/09/2015 0133   CREATININE 1.06 03/12/2015 1015   CREATININE 1.15 08/24/2014 0911   CREATININE 1.07 03/20/2014 1118   CALCIUM 8.4* 12/11/2015 0205   CALCIUM  8.2* 12/10/2015 0410   CALCIUM 8.6* 12/09/2015 0133   GFRNONAA 54* 12/11/2015 0205   GFRNONAA 52* 12/10/2015 0410   GFRNONAA 44* 12/09/2015 0133   GFRNONAA 73 03/12/2015 1015   GFRNONAA 72 03/20/2014 1118   GFRNONAA 82 01/23/2014 1006   GFRAA >60 12/11/2015 0205   GFRAA 60* 12/10/2015 0410   GFRAA 51* 12/09/2015 0133   GFRAA 84 03/12/2015 1015   GFRAA 84 03/20/2014 1118   GFRAA >89 01/23/2014 1006   CBC    Component Value Date/Time   WBC 19.9* 12/11/2015 0205   RBC 4.51 12/11/2015 0205   RBC 4.97 08/03/2010 1557   HGB 9.7* 12/11/2015 0205   HCT 30.2* 12/11/2015 0205   PLT 407* 12/11/2015 0205   MCV 67.0* 12/11/2015 0205   MCH 21.5* 12/11/2015 0205   MCHC 32.1 12/11/2015 0205   RDW 15.8* 12/11/2015 0205   LYMPHSABS 2.4 12/08/2015 1530   MONOABS 4.8* 12/08/2015 1530   EOSABS 0.0 12/08/2015 1530   BASOSABS 0.0 12/08/2015 1530   HEPATIC Function Panel  Recent Labs  03/12/15 1015 12/08/15 1039 12/08/15 1530  PROT 7.8 7.0 8.1   HEMOGLOBIN A1C No components found for: HGA1C,  MPG CARDIAC ENZYMES Lab Results  Component Value Date   CKTOTAL 66 12/16/2011   CKMB 1.6 12/16/2011   TROPONINI 0.03 12/09/2015   TROPONINI <0.03 12/08/2015   TROPONINI <0.30 01/25/2014  BNP No results for input(s): PROBNP in the last 8760 hours. TSH  Recent Labs  11/24/15 1052 12/08/15 1530  TSH 1.54 1.545   CHOLESTEROL  Recent Labs  12/09/15 0133  CHOL 144    Scheduled Meds: . amiodarone  200 mg Oral BID  . atorvastatin  40 mg Oral q1800  . ferrous sulfate  325 mg Oral Q breakfast  . ipratropium  0.5 mg Nebulization TID  . levalbuterol  1.25 mg Nebulization TID  . levETIRAcetam  1,500 mg Oral BID  . levofloxacin  750 mg Oral QHS  . metoprolol tartrate  12.5 mg Oral BID  . mometasone-formoterol  2 puff Inhalation BID  . pantoprazole  40 mg Oral Q1200  . rivaroxaban  20 mg Oral Q supper  . senna-docusate  1 tablet Oral BID  . sodium chloride  3 mL Intravenous Q12H  .  sodium chloride  3 mL Intravenous Q12H   Continuous Infusions:  PRN Meds:.sodium chloride, sodium chloride, acetaminophen, nitroGLYCERIN, ondansetron (ZOFRAN) IV, sodium chloride, sodium chloride, technetium TC 53M diethylenetriame-pentaacetic acid  Assessment/Plan: Status post atypical chest pain MI ruled out status post left cath Status post A. fib with RVR converted back to sinus rhythm Left lower lobe pneumonia Marked leukocytosis secondary to steroids doubt empyema Acute on chronic anemia rule out GI loss Compensated diastolic congestive heart failure COPD Morbid obesity GERD Osteoarthritis  Plan Increase activity as tolerated.     LOS: 3 days    Dixie Dials  MD  12/11/2015, 10:57 AM

## 2015-12-11 NOTE — Clinical Social Work Note (Signed)
Clinical Social Worker met with patient at bedside to offer support and discuss patient needs at discharge.  Patient states that he lives at home alone and plans to return home alone.  Patient adamantly refuses SNF placement and states that he will process the idea of home health, however may not even be agreeable at discharge.  CSW to notify RNCM of patient wishes.  Clinical Social Worker will sign off for now as social work intervention is no longer needed. Please consult [place] again if new need arises.  [name] [name], [name] [name]

## 2015-12-12 ENCOUNTER — Encounter (HOSPITAL_COMMUNITY): Payer: Self-pay

## 2015-12-12 NOTE — Progress Notes (Signed)
Ref: Lorayne Marek, MD   Subjective:  No significant change but afebrile.   Objective:  Vital Signs in the last 24 hours: Temp:  [98.7 F (37.1 C)-99.1 F (37.3 C)] 98.7 F (37.1 C) (12/18 0500) Pulse Rate:  [75-77] 75 (12/18 0500) Cardiac Rhythm:  [-] Normal sinus rhythm;Bundle branch block (12/18 0800) Resp:  [16-20] 16 (12/18 0500) BP: (94-116)/(56-71) 99/69 mmHg (12/18 0807) SpO2:  [91 %-98 %] 97 % (12/18 0828) Weight:  [107.956 kg (238 lb)] 107.956 kg (238 lb) (12/18 0500)  Physical Exam: BP Readings from Last 1 Encounters:  12/12/15 99/69    Wt Readings from Last 1 Encounters:  12/12/15 107.956 kg (238 lb)    Weight change:   HEENT: [place]/AT, Eyes-Brown, PERL, EOMI, Conjunctiva-Pink, Sclera-Non-icteric Neck: No JVD, No bruit, Trachea midline. Lungs:  Fine basal crackles, bilateral. Cardiac:  Regular rhythm, normal S1 and S2, no S3. II/VI systolic murmur Abdomen:  Soft, non-tender. Extremities:  Trace edema present. No cyanosis. No clubbing. CNS: AxOx3, Cranial nerves grossly intact, moves all 4 extremities. Right handed. Skin: Warm and dry.   Intake/Output from previous day: 12/17 0701 - 12/18 0700 In: 3 [I.V.:3] Out: 425 [Urine:425]    Lab Results: BMET    Component Value Date/Time   NA 137 12/11/2015 0205   NA 136 12/10/2015 0410   NA 136 12/09/2015 0133   K 3.9 12/11/2015 0205   K 3.9 12/10/2015 0410   K 3.8 12/09/2015 0133   CL 103 12/11/2015 0205   CL 101 12/10/2015 0410   CL 101 12/09/2015 0133   CO2 27 12/11/2015 0205   CO2 26 12/10/2015 0410   CO2 27 12/09/2015 0133   GLUCOSE 100* 12/11/2015 0205   GLUCOSE 88 12/10/2015 0410   GLUCOSE 120* 12/09/2015 0133   BUN 24* 12/11/2015 0205   BUN 23* 12/10/2015 0410   BUN 17 12/09/2015 0133   CREATININE 1.33* 12/11/2015 0205   CREATININE 1.38* 12/10/2015 0410   CREATININE 1.58* 12/09/2015 0133   CREATININE 1.06 03/12/2015 1015   CREATININE 1.15 08/24/2014 0911   CREATININE 1.07 03/20/2014 1118   CALCIUM 8.4* 12/11/2015 0205   CALCIUM 8.2* 12/10/2015 0410   CALCIUM 8.6* 12/09/2015 0133   GFRNONAA 54* 12/11/2015 0205   GFRNONAA 52* 12/10/2015 0410   GFRNONAA 44* 12/09/2015 0133   GFRNONAA 73 03/12/2015 1015   GFRNONAA 72 03/20/2014 1118   GFRNONAA 82 01/23/2014 1006   GFRAA >60 12/11/2015 0205   GFRAA 60* 12/10/2015 0410   GFRAA 51* 12/09/2015 0133   GFRAA 84 03/12/2015 1015   GFRAA 84 03/20/2014 1118   GFRAA >89 01/23/2014 1006   CBC    Component Value Date/Time   WBC 19.9* 12/11/2015 0205   RBC 4.51 12/11/2015 0205   RBC 4.97 08/03/2010 1557   HGB 9.7* 12/11/2015 0205   HCT 30.2* 12/11/2015 0205   PLT 407* 12/11/2015 0205   MCV 67.0* 12/11/2015 0205   MCH 21.5* 12/11/2015 0205   MCHC 32.1 12/11/2015 0205   RDW 15.8* 12/11/2015 0205   LYMPHSABS 2.4 12/08/2015 1530   MONOABS 4.8* 12/08/2015 1530   EOSABS 0.0 12/08/2015 1530   BASOSABS 0.0 12/08/2015 1530   HEPATIC Function Panel  Recent Labs  03/12/15 1015 12/08/15 1039 12/08/15 1530  PROT 7.8 7.0 8.1   HEMOGLOBIN A1C No components found for: HGA1C,  MPG CARDIAC ENZYMES Lab Results  Component Value Date   CKTOTAL 66 12/16/2011   CKMB 1.6 12/16/2011   TROPONINI 0.03 12/09/2015   TROPONINI <0.03  12/08/2015   TROPONINI <0.30 01/25/2014   BNP No results for input(s): PROBNP in the last 8760 hours. TSH  Recent Labs  11/24/15 1052 12/08/15 1530  TSH 1.54 1.545   CHOLESTEROL  Recent Labs  12/09/15 0133  CHOL 144    Scheduled Meds: . amiodarone  200 mg Oral BID  . atorvastatin  40 mg Oral q1800  . ferrous sulfate  325 mg Oral Q breakfast  . ipratropium  0.5 mg Nebulization TID  . levalbuterol  1.25 mg Nebulization TID  . levETIRAcetam  1,500 mg Oral BID  . levofloxacin  750 mg Oral QHS  . metoprolol tartrate  12.5 mg Oral BID  . mometasone-formoterol  2 puff Inhalation BID  . pantoprazole  40 mg Oral Q1200  . rivaroxaban  20 mg Oral Q supper  . senna-docusate  1 tablet Oral BID  .  sodium chloride  3 mL Intravenous Q12H  . sodium chloride  3 mL Intravenous Q12H   Continuous Infusions:  PRN Meds:.sodium chloride, sodium chloride, acetaminophen, nitroGLYCERIN, ondansetron (ZOFRAN) IV, sodium chloride, sodium chloride, technetium TC 49M diethylenetriame-pentaacetic acid  Assessment/Plan: Status post atypical chest pain MI ruled out status post left cath Status post A. fib with RVR converted back to sinus rhythm Left lower lobe pneumonia Marked leukocytosis secondary to steroids doubt empyema Acute on chronic anemia rule out GI loss Compensated diastolic congestive heart failure COPD Morbid obesity GERD Osteoarthritis  Continue antibiotics.    LOS: 4 days    Dixie Dials  MD  12/12/2015, 12:27 PM

## 2015-12-13 ENCOUNTER — Inpatient Hospital Stay (HOSPITAL_COMMUNITY): Payer: Commercial Managed Care - HMO

## 2015-12-13 LAB — CBC
HEMATOCRIT: 35.7 % — AB (ref 39.0–52.0)
HEMOGLOBIN: 12 g/dL — AB (ref 13.0–17.0)
MCH: 22 pg — ABNORMAL LOW (ref 26.0–34.0)
MCHC: 33.6 g/dL (ref 30.0–36.0)
MCV: 65.4 fL — AB (ref 78.0–100.0)
Platelets: ADEQUATE 10*3/uL (ref 150–400)
RBC: 5.46 MIL/uL (ref 4.22–5.81)
RDW: 16.2 % — AB (ref 11.5–15.5)
WBC: 24 10*3/uL — AB (ref 4.0–10.5)

## 2015-12-13 LAB — BASIC METABOLIC PANEL
ANION GAP: 12 (ref 5–15)
BUN: 24 mg/dL — ABNORMAL HIGH (ref 6–20)
CALCIUM: 9 mg/dL (ref 8.9–10.3)
CHLORIDE: 99 mmol/L — AB (ref 101–111)
CO2: 25 mmol/L (ref 22–32)
Creatinine, Ser: 1.49 mg/dL — ABNORMAL HIGH (ref 0.61–1.24)
GFR calc non Af Amer: 47 mL/min — ABNORMAL LOW (ref >60)
GFR, EST AFRICAN AMERICAN: 55 mL/min — AB (ref >60)
Glucose, Bld: 131 mg/dL — ABNORMAL HIGH (ref 65–99)
POTASSIUM: 4.7 mmol/L (ref 3.5–5.1)
Sodium: 136 mmol/L (ref 135–145)

## 2015-12-13 MED ORDER — PANTOPRAZOLE SODIUM 40 MG PO TBEC
40.0000 mg | DELAYED_RELEASE_TABLET | Freq: Two times a day (BID) | ORAL | Status: DC
  Administered 2015-12-13 – 2015-12-18 (×10): 40 mg via ORAL
  Filled 2015-12-13 (×10): qty 1

## 2015-12-13 MED ORDER — LEVALBUTEROL HCL 1.25 MG/0.5ML IN NEBU
1.2500 mg | INHALATION_SOLUTION | Freq: Four times a day (QID) | RESPIRATORY_TRACT | Status: DC | PRN
  Administered 2015-12-13 (×2): 1.25 mg via RESPIRATORY_TRACT
  Filled 2015-12-13 (×2): qty 0.5

## 2015-12-13 MED ORDER — PIPERACILLIN-TAZOBACTAM 3.375 G IVPB
3.3750 g | Freq: Three times a day (TID) | INTRAVENOUS | Status: DC
  Administered 2015-12-13 – 2015-12-17 (×11): 3.375 g via INTRAVENOUS
  Filled 2015-12-13 (×14): qty 50

## 2015-12-13 MED ORDER — IOHEXOL 300 MG/ML  SOLN
75.0000 mL | Freq: Once | INTRAMUSCULAR | Status: AC | PRN
  Administered 2015-12-13: 75 mL via INTRAVENOUS

## 2015-12-13 MED ORDER — VANCOMYCIN HCL 10 G IV SOLR
2000.0000 mg | Freq: Once | INTRAVENOUS | Status: AC
  Administered 2015-12-13: 2000 mg via INTRAVENOUS
  Filled 2015-12-13: qty 2000

## 2015-12-13 MED ORDER — IBUPROFEN 200 MG PO TABS
400.0000 mg | ORAL_TABLET | Freq: Four times a day (QID) | ORAL | Status: DC
  Administered 2015-12-13 – 2015-12-14 (×2): 400 mg via ORAL
  Filled 2015-12-13 (×2): qty 2

## 2015-12-13 MED ORDER — PNEUMOCOCCAL VAC POLYVALENT 25 MCG/0.5ML IJ INJ: 0.5000 mL | INJECTION | INTRAMUSCULAR | Status: DC | PRN

## 2015-12-13 MED ORDER — ALBUTEROL SULFATE (2.5 MG/3ML) 0.083% IN NEBU: 2.5000 mg | INHALATION_SOLUTION | Freq: Four times a day (QID) | RESPIRATORY_TRACT | Status: DC | PRN

## 2015-12-13 MED ORDER — VANCOMYCIN HCL IN DEXTROSE 750-5 MG/150ML-% IV SOLN
750.0000 mg | Freq: Two times a day (BID) | INTRAVENOUS | Status: DC
  Administered 2015-12-14 – 2015-12-16 (×6): 750 mg via INTRAVENOUS
  Filled 2015-12-13 (×9): qty 150

## 2015-12-13 NOTE — Care Management Important Message (Signed)
Important Message  Patient Details  Name: [name] [name] MRN: [id number] Date of Birth: December 16, 1951   Medicare Important Message Given:  Yes    Bethena Roys, RN 12/13/2015, 5:21 PM

## 2015-12-13 NOTE — Progress Notes (Addendum)
Noted during bedside report at 19:00 that patient was markedly SOB; breathing > 40 per minute. Mildly anxious, alert, oriented x 4 and able to answer questions appropriately, but repeating things multiple times and appearing to have some flight of ideas. Question mild altered mental status. Unable to speak in full sentences due to SOB. Mild respiratory distress; question onset of respiratory failure.  CT of chest with contrast shows bibasilar atelectasis, possible pneumonia and moderate pericardial effusion. Patient is possibly showing signs of early cardiac tamponade:  Low-level tachycardia (rates 100 - 105)  Tachypnea with mild respiratory distress  Possible mild altered mental status  Vital signs obtained: Filed Vitals:   12/13/15 1435 12/13/15 1442 12/13/15 1809 12/13/15 1933  BP: 127/84   128/81  Pulse: 89     Temp: 98.7 F (37.1 C)   98.7 F (37.1 C)  TempSrc: Oral   Oral  Resp: 18   47  Height:      Weight:      SpO2: 100% 92% 98% 98%   Discussed patient's symptoms and vital signs with Dr. Terrence Dupont by telephone. Orders received for 2-D echocardiogram and EKG in AM.  IV vancomycin 2 gm and piperacillin-tazobactam 3.375 gm ordered per pharmacy protocol and started on-time. Patient states he is "feeling better" at the time of this addendum (21:30).  Contiuing to monitor acutely.

## 2015-12-13 NOTE — Hospital Discharge Follow-Up (Signed)
The patient's appointment at the [facility] at the [facility]  has been changed to 12/23/15 @ 1030 and the information was placed on the AVS. Met with the patient and discussed plans for assistance at home.  He is interested in options that are available for assistance with personal care. He said that he is aware of his [employer] home care benefit and requested that this CM speak to him tomorrow to discuss further. He was in the process of having blood drawn.

## 2015-12-13 NOTE — Progress Notes (Signed)
Subjective:  Still complains of shortness of breath with minimal exertion.  Denies any chest pain.  Denies any fever or chills.  Objective:  Vital Signs in the last 24 hours: Temp:  [97.8 F (36.6 C)-99.2 F (37.3 C)] 97.8 F (36.6 C) (12/19 0500) Pulse Rate:  [77-87] 78 (12/19 0500) Resp:  [18] 18 (12/19 0500) BP: (111-123)/(59-87) 123/87 mmHg (12/19 0500) SpO2:  [92 %-100 %] 92 % (12/19 0815) Weight:  [237 lb 9.6 oz (107.775 kg)] 237 lb 9.6 oz (107.775 kg) (12/19 0500)  Intake/Output from previous day: 12/18 0701 - 12/19 0700 In: 1300 [P.O.:1300] Out: 1200 [Urine:1200] Intake/Output from this shift:    Physical Exam: Neck: no adenopathy, no carotid bruit, no JVD and supple, symmetrical, trachea midline Lungs: decreased breath sounds at bases with basilar rhonchi noted Heart: regular rate and rhythm, S1, S2 normal and soft systolic murmur noted Abdomen: soft, non-tender; bowel sounds normal; no masses,  no organomegaly Extremities: no clubbing, cyanosis. 1+ edema noted  Lab Results:  Recent Labs  12/11/15 0205  WBC 19.9*  HGB 9.7*  PLT 407*    Recent Labs  12/11/15 0205  NA 137  K 3.9  CL 103  CO2 27  GLUCOSE 100*  BUN 24*  CREATININE 1.33*   No results for input(s): TROPONINI in the last 72 hours.  Invalid input(s): CK, MB Hepatic Function Panel No results for input(s): PROT, ALBUMIN, AST, ALT, ALKPHOS, BILITOT, BILIDIR, IBILI in the last 72 hours. No results for input(s): CHOL in the last 72 hours. No results for input(s): PROTIME in the last 72 hours.  Imaging: Imaging results have been reviewed and No results found.  Cardiac Studies:  Assessment/Plan:  Status post atypical chest pain MI ruled out status post left cath Status post A. fib with RVR converted back to sinus rhythm Left lower lobe pneumonia Marked leukocytosis secondary to steroids doubt empyema Acute on chronic anemia rule out GI loss Compensated diastolic congestive heart  failure COPD Morbid obesity GERD Osteoarthritis Plan Check chest x-ray PA and lateral. Check labs. Increase ambulation as tolerated  LOS: 5 days    Charolette Forward 12/13/2015, 11:30 AM

## 2015-12-13 NOTE — Progress Notes (Signed)
ANTIBIOTIC CONSULT NOTE - INITIAL  Pharmacy Consult for vancomycin and zosyn Indication: HCAP  No Known Allergies  Patient Measurements: Height: 5\' 7"  (170.2 cm) Weight: 237 lb 9.6 oz (107.775 kg) IBW/kg (Calculated) : 66.1   Vital Signs: Temp: 98.7 F (37.1 C) (12/19 1933) Temp Source: Oral (12/19 1933) BP: 128/81 mmHg (12/19 1933) Pulse Rate: 89 (12/19 1435) Intake/Output from previous day: 12/18 0701 - 12/19 0700 In: 1300 [P.O.:1300] Out: 1200 [Urine:1200] Intake/Output from this shift:    Labs:  Recent Labs  12/11/15 0205 12/13/15 1615  WBC 19.9* 24.0*  HGB 9.7* 12.0*  PLT 407* PLATELET CLUMPS NOTED ON SMEAR, COUNT APPEARS ADEQUATE  CREATININE 1.33*  --    Estimated Creatinine Clearance: 64 mL/min (by C-G formula based on Cr of 1.33). No results for input(s): VANCOTROUGH, VANCOPEAK, VANCORANDOM, GENTTROUGH, GENTPEAK, GENTRANDOM, TOBRATROUGH, TOBRAPEAK, TOBRARND, AMIKACINPEAK, AMIKACINTROU, AMIKACIN in the last 72 hours.   Microbiology: Recent Results (from the past 720 hour(s))  Blood Culture (routine x 2)     Status: None   Collection Time: 11/27/15  8:10 PM  Result Value Ref Range Status   Specimen Description BLOOD LEFT HAND  Final   Special Requests BOTTLES DRAWN AEROBIC AND ANAEROBIC 5CC  Final   Culture NO GROWTH 5 DAYS  Final   Report Status 12/02/2015 FINAL  Final  Urine culture     Status: None   Collection Time: 11/27/15  8:13 PM  Result Value Ref Range Status   Specimen Description URINE, CLEAN CATCH  Final   Special Requests NONE  Final   Culture MULTIPLE SPECIES PRESENT, SUGGEST RECOLLECTION  Final   Report Status 11/29/2015 FINAL  Final  Blood Culture (routine x 2)     Status: None   Collection Time: 11/27/15 11:40 PM  Result Value Ref Range Status   Specimen Description BLOOD RIGHT WRIST  Final   Special Requests IN PEDIATRIC BOTTLE 5CC  POS ON ZOSYN AND VANCO  Final   Culture NO GROWTH 5 DAYS  Final   Report Status 12/03/2015 FINAL   Final  MRSA PCR Screening     Status: Abnormal   Collection Time: 11/28/15  2:42 AM  Result Value Ref Range Status   MRSA by PCR POSITIVE (A) NEGATIVE Final    Comment:        The GeneXpert MRSA Assay (FDA approved for NASAL specimens only), is one component of a comprehensive MRSA colonization surveillance program. It is not intended to diagnose MRSA infection nor to guide or monitor treatment for MRSA infections. RESULT CALLED TO, READ BACK BY AND VERIFIED WITH: Val Riles RN [id number] AT [phone] SKEEN,P     Medical History: Past Medical History  Diagnosis Date  . Hypertension   . Asthma   . Atrial fibrillation ([place])   . CHF (congestive heart failure) ([place])   . Anemia   . Heart disease   . COPD (chronic obstructive pulmonary disease) ([facility])    Assessment: 64 yo M on day # 5 of levaquin therapy.  Pharmacy consulted to dose vanc/zosyn for HCAP.  WBC 24, Afebrile, Wt 107.8 kg, creat 1.33, creat cl ~ 57-64 ml/min.  12/19 CXR: patchy B lower lobe opacitites- likely compressive atelectasis   Levaquin 12/15>>12/19  Vanc 12/19>> Zosyn 12/19>>   Goal of Therapy:  Vancomycin trough level 15-20 mcg/ml  Plan:  - dc lvq - zosyn 3375 q8  - vanc 2 gm x 1 then 750 q12, if creat worsens, then 1250 q24 per obesity nomogram - f/u renal  fxn, wbc, temp, CXR, culture data - vanc levels as needed  Eudelia Bunch, Pharm.D. [id number] 12/13/2015 7:48 PM

## 2015-12-13 NOTE — Progress Notes (Signed)
OT Cancellation Note  Patient Details Name: [name] [name] MRN: [id number] DOB: September 20, 1951   Cancelled Treatment:    Reason Eval/Treat Not Completed: Medical issues which prohibited therapy. Pt. With visible labored breathing upon entering the room.  States "i am not doing good at all i know it is the pneumonia".  Unable to complete sentence without breaking to deep breath after each word.  Encouraged to focus on breathing strategies and stop talking as it was very difficult and making breathing more difficult.  Pt. Reports just finishing a breathing tx. o2 100%, BP: 134/68, pulse 88.  Will hold for today as pt. Is unable to tolerate physical activity at this time.  Will check back as pt. Able to tolerate.    Janice Coffin, COTA/L 12/13/2015, 8:43 AM

## 2015-12-14 ENCOUNTER — Inpatient Hospital Stay (HOSPITAL_COMMUNITY): Payer: Commercial Managed Care - HMO

## 2015-12-14 ENCOUNTER — Ambulatory Visit: Payer: Self-pay | Admitting: *Deleted

## 2015-12-14 ENCOUNTER — Other Ambulatory Visit: Payer: Self-pay | Admitting: *Deleted

## 2015-12-14 ENCOUNTER — Inpatient Hospital Stay: Payer: Self-pay | Admitting: Family Medicine

## 2015-12-14 DIAGNOSIS — J9811 Atelectasis: Secondary | ICD-10-CM

## 2015-12-14 DIAGNOSIS — I959 Hypotension, unspecified: Secondary | ICD-10-CM

## 2015-12-14 DIAGNOSIS — R0902 Hypoxemia: Secondary | ICD-10-CM

## 2015-12-14 DIAGNOSIS — A419 Sepsis, unspecified organism: Secondary | ICD-10-CM

## 2015-12-14 LAB — BASIC METABOLIC PANEL
ANION GAP: 9 (ref 5–15)
BUN: 27 mg/dL — ABNORMAL HIGH (ref 6–20)
CHLORIDE: 109 mmol/L (ref 101–111)
CO2: 21 mmol/L — ABNORMAL LOW (ref 22–32)
CREATININE: 1.31 mg/dL — AB (ref 0.61–1.24)
Calcium: 8.4 mg/dL — ABNORMAL LOW (ref 8.9–10.3)
GFR calc non Af Amer: 55 mL/min — ABNORMAL LOW (ref >60)
Glucose, Bld: 135 mg/dL — ABNORMAL HIGH (ref 65–99)
POTASSIUM: 5.5 mmol/L — AB (ref 3.5–5.1)
SODIUM: 139 mmol/L (ref 135–145)

## 2015-12-14 LAB — LACTIC ACID, PLASMA
Lactic Acid, Venous: 1.3 mmol/L (ref 0.5–2.0)
Lactic Acid, Venous: 2 mmol/L (ref 0.5–2.0)

## 2015-12-14 LAB — CORTISOL: Cortisol, Plasma: 55.6 ug/dL

## 2015-12-14 LAB — PROCALCITONIN: Procalcitonin: 0.72 ng/mL

## 2015-12-14 MED ORDER — AMIODARONE HCL IN DEXTROSE 360-4.14 MG/200ML-% IV SOLN: 60.0000 mg/h | INTRAVENOUS | Status: DC

## 2015-12-14 MED ORDER — SODIUM CHLORIDE 0.9 % IV SOLN
INTRAVENOUS | Status: AC
  Administered 2015-12-14: 13:00:00 via INTRAVENOUS

## 2015-12-14 MED ORDER — AMIODARONE HCL IN DEXTROSE 360-4.14 MG/200ML-% IV SOLN: 30.0000 mg/h | INTRAVENOUS | Status: DC

## 2015-12-14 MED ORDER — HYDROCORTISONE NA SUCCINATE PF 100 MG IJ SOLR
50.0000 mg | Freq: Four times a day (QID) | INTRAMUSCULAR | Status: DC
Start: 2015-12-15 — End: 2015-12-16
  Administered 2015-12-15 – 2015-12-16 (×6): 50 mg via INTRAVENOUS
  Filled 2015-12-14 (×5): qty 2

## 2015-12-14 MED ORDER — SODIUM CHLORIDE 0.9 % IV SOLN: 250.0000 mL | INTRAVENOUS | Status: DC | PRN

## 2015-12-14 MED ORDER — SODIUM CHLORIDE 0.9 % IV BOLUS (SEPSIS)
250.0000 mL | Freq: Once | INTRAVENOUS | Status: AC
  Administered 2015-12-14: 250 mL via INTRAVENOUS

## 2015-12-14 MED ORDER — AMIODARONE IV BOLUS ONLY 150 MG/100ML
150.0000 mg | Freq: Once | INTRAVENOUS | Status: AC
  Administered 2015-12-14: 150 mg via INTRAVENOUS
  Filled 2015-12-14: qty 100

## 2015-12-14 MED ORDER — HYDROCORTISONE NA SUCCINATE PF 100 MG IJ SOLR
50.0000 mg | Freq: Four times a day (QID) | INTRAMUSCULAR | Status: DC
  Administered 2015-12-14: 50 mg via INTRAVENOUS
  Filled 2015-12-14 (×2): qty 2

## 2015-12-14 MED ORDER — AMIODARONE IV BOLUS ONLY 150 MG/100ML: 150.0000 mg | Freq: Once | INTRAVENOUS | Status: DC

## 2015-12-14 MED ORDER — AMIODARONE LOAD VIA INFUSION
150.0000 mg | Freq: Once | INTRAVENOUS | Status: DC
  Filled 2015-12-14: qty 83.34

## 2015-12-14 MED ORDER — DIGOXIN 0.25 MG/ML IJ SOLN
0.2500 mg | INTRAMUSCULAR | Status: AC
  Administered 2015-12-14 (×2): 0.25 mg via INTRAVENOUS
  Filled 2015-12-14 (×4): qty 1

## 2015-12-14 MED ORDER — AMIODARONE HCL 200 MG PO TABS
400.0000 mg | ORAL_TABLET | Freq: Two times a day (BID) | ORAL | Status: DC
  Administered 2015-12-14 – 2015-12-18 (×8): 400 mg via ORAL
  Filled 2015-12-14 (×9): qty 2

## 2015-12-14 MED ORDER — AMIODARONE HCL 200 MG PO TABS: 400.0000 mg | ORAL_TABLET | Freq: Two times a day (BID) | ORAL | Status: DC

## 2015-12-14 MED ORDER — SODIUM CHLORIDE 0.9 % IV SOLN: 250.0000 mL | INTRAVENOUS | Status: DC

## 2015-12-14 MED ORDER — PERFLUTREN LIPID MICROSPHERE
1.0000 mL | INTRAVENOUS | Status: AC | PRN
  Administered 2015-12-14: 2 mL via INTRAVENOUS
  Filled 2015-12-14: qty 10

## 2015-12-14 NOTE — Consult Note (Signed)
PULMONARY / CRITICAL CARE MEDICINE   Name: [name] [name] MRN: [id number] DOB: 06/15/1951    ADMISSION DATE:  12/08/2015 CONSULTATION DATE:  12/20  REFERRING MD:  Terrence Dupont   CHIEF COMPLAINT: SOB  HISTORY OF PRESENT ILLNESS:   64 yo aam who was admitted 12/14 with chest pain and negative CC. Developed sob, cough, fever and subsequent imaging reveals lower lobe atx  And moderate pericardial effusion. He is hypotensive(but now in afib) recent steroid use.  D echo is pending. He may have tamponade physiology but heart sounds not muffled, no pulses paradoxical. We will fluid resituate him , check 2 d stat and consider stress steroids. We may need to move him to ICU.   PAST MEDICAL HISTORY :  He  has a past medical history of Hypertension; Asthma; Atrial fibrillation ([place]); CHF (congestive heart failure) ([place]); Anemia; Heart disease; and COPD (chronic obstructive pulmonary disease) ([place]).  PAST SURGICAL HISTORY: He  has past surgical history that includes Esophagus surgery and Cardiac catheterization (N/A, 12/08/2015).  No Known Allergies  No current facility-administered medications on file prior to encounter.   Current Outpatient Prescriptions on File Prior to Encounter  Medication Sig  . albuterol (PROVENTIL HFA;VENTOLIN HFA) 108 (90 BASE) MCG/ACT inhaler Inhale 2 puffs into the lungs every 6 (six) hours as needed.  Marland Kitchen amiodarone (PACERONE) 200 MG tablet Take 1 tablet (200 mg total) by mouth daily.  Marland Kitchen aspirin EC 81 MG tablet Take 81 mg by mouth daily.    . clomiPHENE (CLOMID) 50 MG tablet 1/2 tab daily. (Patient taking differently: Take 25 mg by mouth daily. 1/2 tab daily.)  . clotrimazole-betamethasone (LOTRISONE) cream Apply 1 application topically 2 (two) times daily. (Patient taking differently: Apply 1 application topically 2 (two) times daily as needed (FOR RASH). )  . ferrous sulfate 325 (65 FE) MG tablet Take 325 mg by mouth daily with breakfast.  . fluticasone-salmeterol (ADVAIR  HFA) 115-21 MCG/ACT inhaler Inhale 2 puffs into the lungs daily as needed. For shortness of breath  . levETIRAcetam (KEPPRA) 750 MG tablet Take 2 tablets (1,500 mg total) by mouth 2 (two) times daily.  Marland Kitchen levofloxacin (LEVAQUIN) 750 MG tablet Take 1 tablet (750 mg total) by mouth daily.  Marland Kitchen lisinopril (PRINIVIL,ZESTRIL) 20 MG tablet Take 20 mg by mouth daily.   . metoCLOPramide (REGLAN) 5 MG tablet Take 1 tablet (5 mg total) by mouth 4 (four) times daily.  . nitroGLYCERIN (NITROSTAT) 0.4 MG SL tablet Place 1 tablet (0.4 mg total) under the tongue every 5 (five) minutes as needed for chest pain.  Marland Kitchen omeprazole (PRILOSEC) 40 MG capsule Take 1 capsule (40 mg total) by mouth 2 (two) times daily before a meal.  . predniSONE (DELTASONE) 20 MG tablet Take 1 tablet (20 mg total) by mouth daily with breakfast.  . sildenafil (REVATIO) 20 MG tablet 2-5 pills as needed for ED symptoms (Patient taking differently: Take 20 mg by mouth daily. )  . Vitamin D, Ergocalciferol, (DRISDOL) 50000 UNITS CAPS capsule Take 1 capsule (50,000 Units total) by mouth every 7 (seven) days. (Patient taking differently: Take 50,000 Units by mouth every Wednesday. )    FAMILY HISTORY:  His indicated that his mother is deceased. He indicated that his father is alive.   SOCIAL HISTORY: He  reports that he has never smoked. He has never used smokeless tobacco. He reports that he does not drink alcohol or use illicit drugs.  REVIEW OF SYSTEMS:   10 point review of system taken, please  see HPI for positives and negatives.   SUBJECTIVE:    VITAL SIGNS: BP 80/58 mmHg  Pulse 76  Temp(Src) 97.5 F (36.4 C) (Oral)  Resp 20  Ht 5\' 7"  (1.702 m)  Wt 238 lb 9.6 oz (108.228 kg)  BMI 37.36 kg/m2  SpO2 100%  HEMODYNAMICS:    VENTILATOR SETTINGS:    INTAKE / OUTPUT: I/O last 3 completed shifts: In: [id number] [P.O.:880; I.V.:240; IV Piggyback:600] Out: 925 [Urine:925]  PHYSICAL EXAMINATION: General: WNWDAAM sitting up  eating Neuro:  Inatct HEENT:  No JVD/LAN, oral mucosa full of food Cardiovascular: HSIR no muffles Lungs: Decreased BS at the bases. Abdomen:soft + bs Musculoskeletal:  intact Skin:  warm  LABS:  BMET  Recent Labs Lab 12/10/15 0410 12/11/15 0205 12/13/15 1924  NA 136 137 136  K 3.9 3.9 4.7  CL 101 103 99*  CO2 26 27 25   BUN 23* 24* 24*  CREATININE 1.38* 1.33* 1.49*  GLUCOSE 88 100* 131*    Electrolytes  Recent Labs Lab 12/08/15 1530  12/10/15 0410 12/11/15 0205 12/13/15 1924  CALCIUM 8.7*  < > 8.2* 8.4* 9.0  MG 2.2  --   --   --   --   < > = values in this interval not displayed.  CBC  Recent Labs Lab 12/10/15 0410 12/11/15 0205 12/13/15 1615  WBC 19.6* 19.9* 24.0*  HGB 8.7* 9.7* 12.0*  HCT 28.2* 30.2* 35.7*  PLT 370 407* PLATELET CLUMPS NOTED ON SMEAR, COUNT APPEARS ADEQUATE    Coag's  Recent Labs Lab 12/08/15 1039  APTT 34  INR 1.28    Sepsis Markers No results for input(s): LATICACIDVEN, PROCALCITON, O2SATVEN in the last 168 hours.  ABG No results for input(s): PHART, PCO2ART, PO2ART in the last 168 hours.  Liver Enzymes  Recent Labs Lab 12/08/15 1039 12/08/15 1530  AST 31 35  ALT 48 52  ALKPHOS 103 127*  BILITOT 1.0 1.6*  ALBUMIN 2.5* 2.8*    Cardiac Enzymes  Recent Labs Lab 12/08/15 1530 12/09/15 0133  TROPONINI <0.03 0.03    Glucose No results for input(s): GLUCAP in the last 168 hours.  Imaging Dg Chest 2 View  12/13/2015  ADDENDUM REPORT: 12/13/2015 13:44 ADDENDUM: Study discussed by telephone with Dr. Charolette Forward on 12/13/2015 at 1336 hours. Electronically Signed   By: Genevie Ann M.D.   On: 12/13/2015 13:44  12/13/2015  CLINICAL DATA:  64 year old male with shortness of breath and wheezing today. Initial encounter. EXAM: CHEST  2 VIEW COMPARISON:  12/09/2015 and earlier. FINDINGS: Enlargement of the cardiac silhouette appears to be new since 2015. Moderate to severe cardiomegaly at this time, and appears  increased since 11/29/2015. Continued low lung volumes. Ongoing poor ventilation at the left lung base. Bilateral small pleural effusions suspected. No pneumothorax. No overt pulmonary edema. Linear perihilar markings greater on the left have not significantly changed since 12/09/2015. No acute osseous abnormality identified. Negative visible bowel gas pattern. IMPRESSION: 1. Enlarged cardiac silhouette appears to be new since 2015 and has progressed in the past 2 weeks. Consider acute pericardial effusion. 2. Low lung volumes with bilateral pleural effusions. Dense superimposed consolidation or atelectasis at the left lung base. Superimposed increased bilateral perihilar opacity most resembles atelectasis. Electronically Signed: By: Genevie Ann M.D. On: 12/13/2015 13:29   Ct Chest W Contrast  12/13/2015  CLINICAL DATA:  Extreme shortness of breath, wheezing EXAM: CT CHEST WITH CONTRAST TECHNIQUE: Multidetector CT imaging of the chest was performed during intravenous contrast administration. CONTRAST:  15mL OMNIPAQUE IOHEXOL 300 MG/ML  SOLN COMPARISON:  Chest radiographs dated 2016-01-12 FINDINGS: Mediastinum/Nodes: Mild cardiomegaly. Moderate pericardial effusion. Small mediastinal lymph nodes measuring up to 9 mm (series 2/ image 25), favored to be reactive. Visualized thyroid is unremarkable. Lungs/Pleura: Evaluation of the lung parenchyma is constrained by respiratory motion. Small to moderate left pleural effusion. Small right pleural effusion. Associated bilateral lower lobe patchy opacities, likely compressive atelectasis. No suspicious pulmonary nodules. Mild interstitial edema is suspected. No pneumothorax. Upper abdomen: Visualized upper abdomen is notable for a small hiatal hernia. Musculoskeletal: Degenerative changes of the visualized thoracolumbar spine. IMPRESSION: Moderate pericardial effusion. Suspected mild interstitial edema. Small to moderate left and small right pleural effusions. Patchy  bilateral lower lobe opacities, likely compressive atelectasis. Electronically Signed   By: Julian Hy M.D.   On: 01-12-2016 18:11     STUDIES:  12/20 2 d>>  CULTURES: 2024-01-12 bc x 2>> 01-12-24 uc>>  ANTIBIOTICS: 01-12-2024 zoysn>> 2024/01/12 vanc>>  SIGNIFICANT EVENTS:   LINES/TUBES:   DISCUSSION: 64 yo aam who was admitted 12/14 with chest pain and negative CC. Developed sob, cough, fever and subsequent imaging reveals lower lobe atx  And moderate pericardial effusion. He is hypotensive(but now in afib) recent steroid use.  D echo is pending. He may have tamponade physiology but heart sounds not muffled, no pulses paradoxical. We will fluid resituate him , check 2 d stat and consider stress steroids. We may need to move him to ICU.   ASSESSMENT / PLAN:  PULMONARY A: Hypoxia Asthma Possible pna  P:   O2   Vanc/zosyn. No thora, small effusions O2 for sat of 88-92%. IS for atelectasis  CARDIOVASCULAR A:  Hx of CAD Afib rvr Pericardial effusion Hypotension P:  Hold antihypertensives Check cortisol level, May need stress steroids if refractory to fluids Start stress dose steroids May need pressors (neo drug of choice) Fluids to increase filling pressures in setting pericardial effusion  Transfer to the ICU for pressors if remains hypotensive.  RENAL Lab Results  Component Value Date   CREATININE 1.49* 2016-01-12   CREATININE 1.33* 12/11/2015   CREATININE 1.38* 12/10/2015   CREATININE 1.06 03/12/2015   CREATININE 1.15 08/24/2014   CREATININE 1.07 03/20/2014   A:   Worsening renal failure P:   Discontinue lasix.  GASTROINTESTINAL A:   GI protection P:   PPI  HEMATOLOGIC A:   No acute issue Off all anticoagulants for pericardial effusion P:  Monitor h/h  INFECTIOUS A:   Presumed pna, left loculated effusion WBC but recent steroids. P:   Abx Sputum culture Possible thora for dx  ENDOCRINE A:   Chronic steroid use for unknown reason. P:    Check cortisol level.  Stress dose steroids May be cause of shock  NEUROLOGIC A:   No acute issues P:   Hold any sedating medications.  FAMILY  - Updates: None at bedside  - Inter-disciplinary family meet or Palliative Care meeting due by:  day 7  [facility] Minor ACNP Maryanna Shape PCCM Pager [phone] till 3 pm If no answer page [phone] 12/14/2015, 12:24 PM  Attending Note:  Patient seen and examined, above note edited in full, additions are in bold.  In summary, 64 year old man carrying the diagnosis of COPD who presents to the hospital with chest pain.  Clean cardiac cath.  CT showed low lung volumes and small bilateral pleural effusions as well as pericardial tamponade.  WBC is 24 with procalcitonin of 0.16 indicating less likely to be a  bacterial infection.  Patient is chronically on steroids, unsure of reason and is not on steroids here, will check cortisol level and start stress dose steroids.  If BP does not respond then will consider transferring the patient to the ICU for pressors.  Patient seen and examined, agree with above note.  I dictated the care and orders written for this patient under my direction.  Rush Farmer, MD [phone]

## 2015-12-14 NOTE — Plan of Care (Signed)
Problem: Bowel/Gastric: Goal: Will not experience complications related to bowel motility Outcome: Progressing Patient received 1 senna this am.  Last BM THURS

## 2015-12-14 NOTE — Plan of Care (Signed)
Problem: Nutrition: Goal: Adequate nutrition will be maintained Outcome: Progressing Spoke to patient about heart failure,  sodium restriction, daily weights.  Patient given the "living Better with Heart Failure" book.

## 2015-12-14 NOTE — Patient Outreach (Signed)
[facility]) Care Management  12/14/2015  [name] [name] [birth year] [id number]   Assessment: Transition of care - Initial home visit-  Cancelled due to hospitalization Scheduled initial home visit for today cancelled due to patient's admission to the hospital (12/14) for chest pain, increase shortness of breath and EKG changes (ST elevation - STEMI).  Plan: Will contact hospital liaison to update. Will follow-up patient after hospital discharge for transition of care - Initial home visit.  Rajveer Handler A. Indiyah Paone, BSN, RN-BC [place] Management Coordinator Cell: [phone]

## 2015-12-14 NOTE — Progress Notes (Addendum)
Subjective:  Patient denies any chest pain states breathing has improved overall feels better than yesterday. Chest x-ray yesterday showed worsening bilateral atelectasis with effusion and increased cardiac silhouette subsequent had CT of the chest which suggested moderate pericardial effusion. 2-D echo is still pending. Converted back into atrial fibrillation with moderate ventricular response. Xarelto was DC'd in view of worsening pericardial effusion and was started on NSAIDs. Patient clinically improved although remains hypotensive asymptomatic  Objective:  Vital Signs in the last 24 hours: Temp:  [97.5 F (36.4 C)-98.7 F (37.1 C)] 97.5 F (36.4 C) (12/20 0357) Pulse Rate:  [76-99] 76 (12/20 0357) Resp:  [18-47] 20 (12/20 0357) BP: (80-129)/(46-84) 85/50 mmHg (12/20 1132) SpO2:  [92 %-100 %] 100 % (12/20 0757) Weight:  [238 lb 9.6 oz (108.228 kg)] 238 lb 9.6 oz (108.228 kg) (12/20 0357)  Intake/Output from previous day: 12/19 0701 - 12/20 0700 In: 1080 [P.O.:240; I.V.:240; IV Piggyback:600] Out: 400 [Urine:400] Intake/Output from this shift:    Physical Exam: Neck: no adenopathy, no carotid bruit, no JVD and supple, symmetrical, trachea midline Lungs: Decreased breath sound at bases with bilateral rhonchi Heart: irregularly irregular rhythm, S1, S2 normal and Soft systolic murmur noted no pericardial rub Abdomen: soft, non-tender; bowel sounds normal; no masses,  no organomegaly Extremities: extremities normal, atraumatic, no cyanosis or edema  Lab Results:  Recent Labs  12/13/15 1615  WBC 24.0*  HGB 12.0*  PLT PLATELET CLUMPS NOTED ON SMEAR, COUNT APPEARS ADEQUATE    Recent Labs  12/13/15 1924  NA 136  K 4.7  CL 99*  CO2 25  GLUCOSE 131*  BUN 24*  CREATININE 1.49*   No results for input(s): TROPONINI in the last 72 hours.  Invalid input(s): CK, MB Hepatic Function Panel No results for input(s): PROT, ALBUMIN, AST, ALT, ALKPHOS, BILITOT, BILIDIR, IBILI in  the last 72 hours. No results for input(s): CHOL in the last 72 hours. No results for input(s): PROTIME in the last 72 hours.  Imaging: Imaging results have been reviewed and Dg Chest 2 View  12/13/2015  ADDENDUM REPORT: 12/13/2015 13:44 ADDENDUM: Study discussed by telephone with Dr. Charolette Forward on 12/13/2015 at 1336 hours. Electronically Signed   By: Genevie Ann M.D.   On: 12/13/2015 13:44  12/13/2015  CLINICAL DATA:  64 year old male with shortness of breath and wheezing today. Initial encounter. EXAM: CHEST  2 VIEW COMPARISON:  12/09/2015 and earlier. FINDINGS: Enlargement of the cardiac silhouette appears to be new since 2015. Moderate to severe cardiomegaly at this time, and appears increased since 11/29/2015. Continued low lung volumes. Ongoing poor ventilation at the left lung base. Bilateral small pleural effusions suspected. No pneumothorax. No overt pulmonary edema. Linear perihilar markings greater on the left have not significantly changed since 12/09/2015. No acute osseous abnormality identified. Negative visible bowel gas pattern. IMPRESSION: 1. Enlarged cardiac silhouette appears to be new since 2015 and has progressed in the past 2 weeks. Consider acute pericardial effusion. 2. Low lung volumes with bilateral pleural effusions. Dense superimposed consolidation or atelectasis at the left lung base. Superimposed increased bilateral perihilar opacity most resembles atelectasis. Electronically Signed: By: Genevie Ann M.D. On: 12/13/2015 13:29   Ct Chest W Contrast  12/13/2015  CLINICAL DATA:  Extreme shortness of breath, wheezing EXAM: CT CHEST WITH CONTRAST TECHNIQUE: Multidetector CT imaging of the chest was performed during intravenous contrast administration. CONTRAST:  76mL OMNIPAQUE IOHEXOL 300 MG/ML  SOLN COMPARISON:  Chest radiographs dated 12/13/2015 FINDINGS: Mediastinum/Nodes: Mild cardiomegaly. Moderate pericardial effusion.  Small mediastinal lymph nodes measuring up to 9 mm (series  2/ image 25), favored to be reactive. Visualized thyroid is unremarkable. Lungs/Pleura: Evaluation of the lung parenchyma is constrained by respiratory motion. Small to moderate left pleural effusion. Small right pleural effusion. Associated bilateral lower lobe patchy opacities, likely compressive atelectasis. No suspicious pulmonary nodules. Mild interstitial edema is suspected. No pneumothorax. Upper abdomen: Visualized upper abdomen is notable for a small hiatal hernia. Musculoskeletal: Degenerative changes of the visualized thoracolumbar spine. IMPRESSION: Moderate pericardial effusion. Suspected mild interstitial edema. Small to moderate left and small right pleural effusions. Patchy bilateral lower lobe opacities, likely compressive atelectasis. Electronically Signed   By: Julian Hy M.D.   On: 12/13/2015 18:11    Cardiac Studies:  Assessment/Plan:  Status post atypical chest pain MI ruled out status post left cath Acute pleuropericarditis questionable etiology Recurrent atrial fibrillation with moderate ventricular response Hypotension rule out pericardial temponade Left lower lobe pneumonia Marked leukocytosis secondary to steroids doubt empyema Acute on chronic anemia rule out GI loss Compensated diastolic congestive heart failure COPD Morbid obesity GERD Osteoarthritis Plan IV fluid bolus to 250 mL and repeat if BP remains below 90 systolic. 2-D echo stat Pulmonary consult May need CT-guided paracentesis if/pericardiocentesis Check labs  LOS: 6 days    Charolette Forward 12/14/2015, 11:42 AM

## 2015-12-14 NOTE — Plan of Care (Signed)
Problem: Education: Goal: Knowledge of  General Education information/materials will improve Outcome: Progressing Patient with community-acquired pneumonia and newly identified moderate pericardial effusion identified by CT yesterday evening. Was experiencing mild respiratory distress around change of shift, now resolved for the most part. Patient states he is feeling "much better."  Concern was for early tamponade related to pericardial effusion, given signs and symptoms at the time.  Question now if this may have been contrast-induced, since symptoms have mainly resolved at this point.  Patient issues were earlier communicated to physician along with vital signs and orders were received for further non-invasive testing this AM.  Last filed vital signs: Filed Vitals:    12/13/15 1944 12/13/15 2037 12/13/15 2039 12/14/15 0357  BP: 129/83     80/46  Pulse: 99     76  Temp: 98.6 F (37 C)     97.5 F (36.4 C)  TempSrc: Oral     Oral  Resp: 22     20  Height:          Weight:          SpO2: 100% 99% 100% 98%    Patient educated earlier this evening re:  Plan of care  Labs/tests/procedures  CT of chest with contrast  Chest x-ray  Echocardiogram  EKG  Pain management  Medications  Ibuprofen  Vancomycin  Piperacillin-tazobactam  Levetiracetam  Metoprolol tartrate  Pantoprazole  Safety plan  Activity progression  Discharge goals/planning  Patient has a generally good understanding of basic heart failure principles and is ready to learn new health management strategies. Would recommend revisiting heart failure education with patient prior to discharge to maximize safety and compliance with health maintenance post-discharge. Will add teaching point to education plan and communicate to oncoming shift this AM. See education record for additional information.  Of note: AM EKG shows ACUTE MI / STEMI. This result is consistent with previously  recorded EKGs. Acute coronary syndrome has been ruled-out by left-heart catheterization and serial troponin I this admission. Patient appears to have chronic ST-elevation or J-point elevation that is actually less pronounced on this AM's EKG.  BP is soft this AM. Patient is asymptomatic. Will recheck BP manually and contact physician if necessary.  No other action taken at this time.  Continuing to monitor.

## 2015-12-14 NOTE — Progress Notes (Signed)
  Echocardiogram 2D Echocardiogram has been performed.  Jennette Dubin 12/14/2015, 3:35 PM

## 2015-12-14 NOTE — Hospital Discharge Follow-Up (Signed)
Attempted to meet with the patient to further discuss his discharge plans.  He said that he had a long day and his dinner was being delivered.  CM to attempt to meet with the patient at a later time.   Will continue to follow his hospital course.

## 2015-12-14 NOTE — Progress Notes (Signed)
Patient needed to ambulate to bathroom.  Heart Rate in 130's-140's.  Awaiting second dose of IV digoxin.  Will Continue to monitor.

## 2015-12-14 NOTE — Progress Notes (Signed)
OT Cancellation Note  Patient Details Name: [name] [name] MRN: [id number] DOB: [birth year]   Cancelled Treatment:    Reason Eval/Treat Not Completed:  Spoke with nurse and she recommended waiting until tomorrow for therapy as pt has had elevated HR today.  Benito Mccreedy OTR/L [id number] 12/14/2015, 4:08 PM

## 2015-12-14 NOTE — Progress Notes (Signed)
PT Cancellation Note  Patient Details Name: GRAYLON AMIE MRN: [id number] DOB: 26-Feb-1951   Cancelled Treatment:    Reason Eval/Treat Not Completed: Patient at procedure or test/unavailable (Per RN, pt preparing for echo).  PT will continue to follow acutely.  Joslyn Hy PT, DPT [phone] Pager: [phone] 12/14/2015, 2:36 PM

## 2015-12-15 ENCOUNTER — Other Ambulatory Visit: Payer: Self-pay

## 2015-12-15 DIAGNOSIS — J181 Lobar pneumonia, unspecified organism: Secondary | ICD-10-CM

## 2015-12-15 DIAGNOSIS — J189 Pneumonia, unspecified organism: Secondary | ICD-10-CM | POA: Insufficient documentation

## 2015-12-15 DIAGNOSIS — J188 Other pneumonia, unspecified organism: Secondary | ICD-10-CM

## 2015-12-15 LAB — COMPREHENSIVE METABOLIC PANEL
ALK PHOS: 182 U/L — AB (ref 38–126)
ALT: 54 U/L (ref 17–63)
AST: 62 U/L — AB (ref 15–41)
Albumin: 1.8 g/dL — ABNORMAL LOW (ref 3.5–5.0)
Anion gap: 8 (ref 5–15)
BILIRUBIN TOTAL: 0.5 mg/dL (ref 0.3–1.2)
BUN: 20 mg/dL (ref 6–20)
CHLORIDE: 107 mmol/L (ref 101–111)
CO2: 26 mmol/L (ref 22–32)
CREATININE: 1.2 mg/dL (ref 0.61–1.24)
Calcium: 8.5 mg/dL — ABNORMAL LOW (ref 8.9–10.3)
GFR calc Af Amer: >60 mL/min (ref >60)
Glucose, Bld: 118 mg/dL — ABNORMAL HIGH (ref 65–99)
Potassium: 4.7 mmol/L (ref 3.5–5.1)
Sodium: 141 mmol/L (ref 135–145)
Total Protein: 7.1 g/dL (ref 6.5–8.1)

## 2015-12-15 LAB — CBC
HEMATOCRIT: 29.8 % — AB (ref 39.0–52.0)
HEMOGLOBIN: 9.8 g/dL — AB (ref 13.0–17.0)
MCH: 21.6 pg — ABNORMAL LOW (ref 26.0–34.0)
MCHC: 32.9 g/dL (ref 30.0–36.0)
MCV: 65.6 fL — AB (ref 78.0–100.0)
Platelets: 446 10*3/uL — ABNORMAL HIGH (ref 150–400)
RBC: 4.54 MIL/uL (ref 4.22–5.81)
RDW: 15.8 % — ABNORMAL HIGH (ref 11.5–15.5)
WBC: 16.2 10*3/uL — AB (ref 4.0–10.5)

## 2015-12-15 LAB — COMPLEMENT, TOTAL: Compl, Total (CH50): >60 U/mL (ref 42–60)

## 2015-12-15 LAB — RHEUMATOID FACTOR: Rhuematoid fact SerPl-aCnc: 19 IU/mL — ABNORMAL HIGH (ref 0.0–13.9)

## 2015-12-15 LAB — ANTINUCLEAR ANTIBODIES, IFA: ANA Ab, IFA: NEGATIVE

## 2015-12-15 MED ORDER — COLCHICINE 0.6 MG PO TABS
0.6000 mg | ORAL_TABLET | Freq: Two times a day (BID) | ORAL | Status: DC
  Administered 2015-12-15 – 2015-12-18 (×7): 0.6 mg via ORAL
  Filled 2015-12-15 (×8): qty 1

## 2015-12-15 NOTE — Plan of Care (Signed)
Problem: Education: Goal: Knowledge of [facility] Education information/materials will improve Outcome: Progressing Patient has had some lability of his status over the past 24 hours.  Respiratory assessment is markedly improved since last evening, however, cardiac assessment reveals some concerning changes today: Was in low-level sinus tachycardia overnight last night and was asymptomatic at rest. Had been sustaining rapid atrial fibrillation with rates ~ 140 throughout the day today with mild SOB and wheezes. Tonight he is sustaining atrial flutter, rates 65 - 90, with conduction 3:1 - 6:1 on telemetry. He is currently asymptomatic.  Received amiodarone 150 mg IV loading dose followed by 400 mg PO tonight without conversion of rhythm.  Patient was having problems with low BPs on and off throughout the day today, now resolved after saline bolus fluids and continuous IV fluid resuscitation (now complete). Filed Vitals:    12/14/15 1541 12/14/15 2011 12/14/15 2013 12/14/15 2025  BP:       110/60  Pulse:       64  Temp:       97.8 F (36.6 C)  TempSrc:       Oral  Resp:       20  Height:          Weight:          SpO2: 100% 100% 100% 99%    Educated patient at bedside re:  Plan of care  Labs/testing/procedures (none currently scheduled, reviewed current results with patient)  Patient rights (currently refusing further lab draws)  Medications  Amiodarone  Xarelto (plan for discharge with NOAC)  Solu-Cortef  Activity progression  Need to call RN for chest pain, SOB, etc.  Basic heart failure principles  Fluid restrictions  Strict I/O  Daily weights on standing scale  Safety plan  Patient is ready to learn new health management strategies. He is concerned about his ongoing health conditions and wants to take things in a positive direction. Heart failure education was re-initiated per RN on day shift; heart failure packet given. Reviewed basic heart  failure principles with patient again this shift.  Patient has been vocal about his concerns (primarily with multiple lab draws); these have been addressed. Unfortunately, he has very poor veins and phlebotomy has had difficulty obtaining some of his labs. IV team was also consulted prior to the change of shift last night and were unable to obtain a second IV site for incompatible medications.  Nursing will need to disconnect his IV, flush, wait at least 15 seconds and then deliver these IV incompatible medications as ordered. Will communicate to oncoming shift in AM.  No further concerns at this time.  Continuing to monitor.

## 2015-12-15 NOTE — Progress Notes (Signed)
Pt just returned from a walk with PT.  RN aware of HR. Pt on RA in no distress. Neb tx done.

## 2015-12-15 NOTE — Consult Note (Signed)
   [facility] CM Inpatient Consult   12/15/2015  [name] [name] 01/26/1951 [id number]   Mr. Cousin recently signed up with [facility] services. Please see chart review tab then notes for further correspondence with patient. Spoke with patient who states he is adamantly against going to SNF. Explained to him that [facility] will not interfere with home health. He continues to agree to [facility] follow up. He will also be followed by [facility]. Appreciative of visit. Inpatient RNCM aware [facility] is following.    Marthenia Rolling, MSN-Ed, RN,BSN [facility] Liaison [phone]

## 2015-12-15 NOTE — Progress Notes (Signed)
Physical Therapy Treatment Patient Details Name: [name] [name] MRN: [id number] DOB: March 03, 1951 Today's Date: 12/15/2015    History of Present Illness 64 yo male with onset of PNA with LLL infiltrate was admitted, has leukocytosis, cleared for MI, on testing and has contact precautions    PT Comments    Patient progressing well towards PT goals. Tolerated ambulation with Min guard assist for safety. Dyspnea on exertion noted but Sp02 stayed >94% on RA. HR increased to 146 bpm during mobility. Pt not safe to return home alone at this time due to weakness and safety concerns. Will continue to follow acutely per current POC.   Follow Up Recommendations  SNF     Equipment Recommendations  Rolling walker with 5" wheels    Recommendations for Other Services       Precautions / Restrictions Precautions Precautions: Fall Precaution Comments: watch HR Restrictions Weight Bearing Restrictions: No    Mobility  Bed Mobility               General bed mobility comments: Standing in bathroom upon PT arrival.   Transfers Overall transfer level: Needs assistance Equipment used: Rolling walker (2 wheeled) Transfers: Sit to/from Stand Sit to Stand: Min guard         General transfer comment: Transferred to chair post ambulation bout. Cues for slow descent into chair as pt flopped uncontrolled.  Ambulation/Gait Ambulation/Gait assistance: Min guard Ambulation Distance (Feet): 150 Feet Assistive device: Rolling walker (2 wheeled) Gait Pattern/deviations: Step-through pattern;Decreased stride length;Wide base of support Gait velocity: slow Gait velocity interpretation: Below normal speed for age/gender General Gait Details: Slow, mildly unsteady gait with cues for RW management. HR increased to 146 bpm. 3/4 DOE.   Stairs            Wheelchair Mobility    Modified Rankin (Stroke Patients Only)       Balance Overall balance assessment: Needs  assistance Sitting-balance support: Feet supported;No upper extremity supported Sitting balance-Leahy Scale: Good     Standing balance support: During functional activity Standing balance-Leahy Scale: Fair Standing balance comment: Able to stand unsupported for short periods however requires RW for ambulation.                    Cognition Arousal/Alertness: Awake/alert Behavior During Therapy: WFL for tasks assessed/performed Overall Cognitive Status: Within Functional Limits for tasks assessed                      Exercises      General Comments        Pertinent Vitals/Pain Pain Assessment: No/denies pain    Home Living                      Prior Function            PT Goals (current goals can now be found in the care plan section) Progress towards PT goals: Progressing toward goals    Frequency  Min 3X/week    PT Plan Current plan remains appropriate    Co-evaluation             End of Session Equipment Utilized During Treatment: Gait belt Activity Tolerance: Patient limited by fatigue Patient left: in chair;with call bell/phone within reach     Time: 1440-1500 PT Time Calculation (min) (ACUTE ONLY): 20 min  Charges:  $Gait Training: 8-22 mins  G Codes:      [name] [name] 12/15/2015, 3:22 PM  Wray Kearns, [place], DPT [phone]

## 2015-12-15 NOTE — Progress Notes (Signed)
Subjective:  Patient denies any chest pain states breathing is improved remains in atrial fibrillation with moderate to rapid ventricular response.  Objective:  Vital Signs in the last 24 hours: Temp:  [97.8 F (36.6 C)-97.9 F (36.6 C)] 97.9 F (36.6 C) (12/21 0459) Pulse Rate:  [64-92] 92 (12/21 0459) Resp:  [20-22] 22 (12/21 0459) BP: (103-110)/(60-79) 108/70 mmHg (12/21 0459) SpO2:  [98 %-100 %] 98 % (12/21 1053) Weight:  [241 lb 6.4 oz (109.498 kg)] 241 lb 6.4 oz (109.498 kg) (12/21 0459)  Intake/Output from previous day: 12/20 0701 - 12/21 0700 In: 1450 [P.O.:960; I.V.:240; IV Piggyback:250] Out: 900 [Urine:900] Intake/Output from this shift: Total I/O In: 480 [P.O.:480] Out: 450 [Urine:450]  Physical Exam: Neck: no adenopathy, no carotid bruit, no JVD and supple, symmetrical, trachea midline Lungs: Decreased breath sound at bases with bibasilar rhonchi Heart: irregularly irregular rhythm, S1, S2 normal and Systolic murmur noted no pericardial rub Abdomen: soft, non-tender; bowel sounds normal; no masses,  no organomegaly Extremities: extremities normal, atraumatic, no cyanosis or edema  Lab Results:  Recent Labs  12/13/15 1615 12/15/15 1125  WBC 24.0* 16.2*  HGB 12.0* 9.8*  PLT PLATELET CLUMPS NOTED ON SMEAR, COUNT APPEARS ADEQUATE 446*    Recent Labs  12/13/15 1924 12/14/15 2303  NA 136 139  K 4.7 5.5*  CL 99* 109  CO2 25 21*  GLUCOSE 131* 135*  BUN 24* 27*  CREATININE 1.49* 1.31*   No results for input(s): TROPONINI in the last 72 hours.  Invalid input(s): CK, MB Hepatic Function Panel No results for input(s): PROT, ALBUMIN, AST, ALT, ALKPHOS, BILITOT, BILIDIR, IBILI in the last 72 hours. No results for input(s): CHOL in the last 72 hours. No results for input(s): PROTIME in the last 72 hours.  Imaging: Imaging results have been reviewed and Dg Chest 2 View  12/13/2015  ADDENDUM REPORT: 12/13/2015 13:44 ADDENDUM: Study discussed by telephone  with Dr. Charolette Forward on 12/13/2015 at 1336 hours. Electronically Signed   By: Genevie Ann M.D.   On: 12/13/2015 13:44  12/13/2015  CLINICAL DATA:  64 year old male with shortness of breath and wheezing today. Initial encounter. EXAM: CHEST  2 VIEW COMPARISON:  12/09/2015 and earlier. FINDINGS: Enlargement of the cardiac silhouette appears to be new since 2015. Moderate to severe cardiomegaly at this time, and appears increased since 11/29/2015. Continued low lung volumes. Ongoing poor ventilation at the left lung base. Bilateral small pleural effusions suspected. No pneumothorax. No overt pulmonary edema. Linear perihilar markings greater on the left have not significantly changed since 12/09/2015. No acute osseous abnormality identified. Negative visible bowel gas pattern. IMPRESSION: 1. Enlarged cardiac silhouette appears to be new since 2015 and has progressed in the past 2 weeks. Consider acute pericardial effusion. 2. Low lung volumes with bilateral pleural effusions. Dense superimposed consolidation or atelectasis at the left lung base. Superimposed increased bilateral perihilar opacity most resembles atelectasis. Electronically Signed: By: Genevie Ann M.D. On: 12/13/2015 13:29   Ct Chest W Contrast  12/13/2015  CLINICAL DATA:  Extreme shortness of breath, wheezing EXAM: CT CHEST WITH CONTRAST TECHNIQUE: Multidetector CT imaging of the chest was performed during intravenous contrast administration. CONTRAST:  79mL OMNIPAQUE IOHEXOL 300 MG/ML  SOLN COMPARISON:  Chest radiographs dated 12/13/2015 FINDINGS: Mediastinum/Nodes: Mild cardiomegaly. Moderate pericardial effusion. Small mediastinal lymph nodes measuring up to 9 mm (series 2/ image 25), favored to be reactive. Visualized thyroid is unremarkable. Lungs/Pleura: Evaluation of the lung parenchyma is constrained by respiratory motion. Small to moderate  left pleural effusion. Small right pleural effusion. Associated bilateral lower lobe patchy opacities,  likely compressive atelectasis. No suspicious pulmonary nodules. Mild interstitial edema is suspected. No pneumothorax. Upper abdomen: Visualized upper abdomen is notable for a small hiatal hernia. Musculoskeletal: Degenerative changes of the visualized thoracolumbar spine. IMPRESSION: Moderate pericardial effusion. Suspected mild interstitial edema. Small to moderate left and small right pleural effusions. Patchy bilateral lower lobe opacities, likely compressive atelectasis. Electronically Signed   By: Julian Hy M.D.   On: 12/13/2015 18:11    Cardiac Studies:  Assessment/Plan:  Status post atypical chest pain MI ruled out status post left cath Acute pleuropericarditis questionable etiology no back on steroids Recurrent atrial fibrillation with moderate ventricular response Status post Hypotension  Left lower lobe pneumonia Marked leukocytosis secondary to steroids doubt empyema Acute on chronic anemia rule out GI loss Compensated diastolic congestive heart failure COPD Morbid obesity GERD Osteoarthritis Plan Continue present management Will hold off to chronic anticoagulation for now in view of risk of conversion to hemorrhagic pericarditis. Start colchicine 0.6 mg twice daily Continue steroids per CCM Off NSAIDs,  LOS: 7 days    Charolette Forward 12/15/2015, 12:36 PM

## 2015-12-15 NOTE — Progress Notes (Signed)
Patient ambulated 150 feet on room air, O2 sats stayed between 94-97%, Heart rate stayed in the range of 138-145 during walk. Patient tolerated well. Sitting up in chair.

## 2015-12-15 NOTE — Hospital Discharge Follow-Up (Signed)
Transitional Care Clinic at [place]:  Patient agreeable to follow-up at the [facility] after discharge. Appointment scheduled on 12/23/15 at 1030 with Dr. Jarold Song. Appointment on AVS.  Discharge plan discussed. He indicated he did not want to go to a skilled nursing facility but was agreeable to home health services.  Per Jacqlyn Krauss, RN CM, patient would be set-up with [facility] RN/PT services with [facility] upon discharge.  May also benefit from a Personal Care Services assessment after discharge; patient interested in services. Will have Dr. Jarold Song determine if patient would benefit from [facility] assessment at initial Transitional Care appointment.  Patient active with [facility].  Jacqlyn Krauss, RN CM updated.

## 2015-12-15 NOTE — Progress Notes (Signed)
Occupational Therapy Treatment Patient Details Name: [name] [name] MRN: [id number] DOB: [birth year] Today's Date: 12/15/2015    History of present illness 64 yo male with onset of PNA with LLL infiltrate was admitted, has leukocytosis, cleared for MI, on testing and has contact precautions   OT comments  Pt seated EOB, self feeding and performed grooming.  Pt declined up to chair. Used visit to educate pt in breathing techniques and energy conservation strategies. Left pt with handout to reinforce education.  Pt with poor awareness of how to care for himself with CHF. Continue to recommend ST rehab in SNF as pt lives alone.  Follow Up Recommendations  SNF    Equipment Recommendations  3 in 1 bedside comode    Recommendations for Other Services      Precautions / Restrictions Precautions Precautions: Fall Precaution Comments: MRSA, watch sats       Mobility Bed Mobility Overal bed mobility: Needs Assistance Bed Mobility: Supine to Sit     Supine to sit: Mod assist     General bed mobility comments: assist for B LEs up into bed  Transfers                      Balance     Sitting balance-Leahy Scale: Good                             ADL Overall ADL's : Needs assistance/impaired     Grooming: Wash/dry hands;Wash/dry face;Sitting;Set up                                 General ADL Comments: Pt educated at length in energy conservation strategies and given handout to reinforce.      Vision                     Perception     Praxis      Cognition   Behavior During Therapy: WFL for tasks assessed/performed Overall Cognitive Status: Within Functional Limits for tasks assessed                       Extremity/Trunk Assessment               Exercises     Shoulder Instructions       General Comments      Pertinent Vitals/ Pain       Pain Assessment: No/denies pain  Home Living                                           Prior Functioning/Environment              Frequency Min 2X/week     Progress Toward Goals  OT Goals(current goals can now be found in the care plan section)  Progress towards OT goals: Progressing toward goals  Acute Rehab OT Goals Patient Stated Goal: to get home and feel better  Plan Discharge plan remains appropriate    Co-evaluation                 End of Session Equipment Utilized During Treatment: Oxygen   Activity Tolerance Patient limited by fatigue   Patient Left in bed;with call bell/phone within reach  Nurse Communication          Time: [id number] OT Time Calculation (min): 28 min  Charges: OT General Charges $OT Visit: 1 Procedure OT Treatments $Self Care/Home Management : 8-22 mins  Malka So 12/15/2015, 8:54 AM  [phone]

## 2015-12-15 NOTE — Progress Notes (Signed)
PULMONARY / CRITICAL CARE MEDICINE   Name: [name] [name] MRN: [id number] DOB: [birth year]    ADMISSION DATE:  12/08/2015 CONSULTATION DATE:  12/20  REFERRING MD:  Terrence Dupont   CHIEF COMPLAINT: SOB  HISTORY OF PRESENT ILLNESS:   64 yo aam who was admitted 12/14 with chest pain and negative CC. Developed sob, cough, fever and subsequent imaging reveals lower lobe atx  And moderate pericardial effusion. He is hypotensive(but now in afib) recent steroid use.  D echo is pending. He may have tamponade physiology but heart sounds not muffled, no pulses paradoxical. We will fluid resituate him , check 2 d stat and consider stress steroids. We may need to move him to ICU.     SUBJECTIVE:  Feels better  VITAL SIGNS: BP 108/70 mmHg  Pulse 92  Temp(Src) 97.9 F (36.6 C) (Oral)  Resp 22  Ht 5\' 7"  (1.702 m)  Wt 241 lb 6.4 oz (109.498 kg)  BMI 37.80 kg/m2  SpO2 98%  HEMODYNAMICS:    VENTILATOR SETTINGS:    INTAKE / OUTPUT: I/O last 3 completed shifts: In: 2290 [P.O.:960; I.V.:480; IV Piggyback:850] Out: 1300 [Urine:1300]  PHYSICAL EXAMINATION: General: WNWDAAM sitting up eating in bed, feels better Neuro:  Inatct HEENT:  No JVD/LAN, oral mucosa full of food Cardiovascular: HSIR no muffles Lungs: Decreased BS at the bases. Abdomen:soft + bs Musculoskeletal:  intact Skin:  warm  LABS:  BMET  Recent Labs Lab 12/11/15 0205 12/13/15 1924 12/14/15 2303  NA 137 136 139  K 3.9 4.7 5.5*  CL 103 99* 109  CO2 27 25 21*  BUN 24* 24* 27*  CREATININE 1.33* 1.49* 1.31*  GLUCOSE 100* 131* 135*    Electrolytes  Recent Labs Lab 12/08/15 1530  12/11/15 0205 12/13/15 1924 12/14/15 2303  CALCIUM 8.7*  < > 8.4* 9.0 8.4*  MG 2.2  --   --   --   --   < > = values in this interval not displayed.  CBC  Recent Labs Lab 12/10/15 0410 12/11/15 0205 12/13/15 1615  WBC 19.6* 19.9* 24.0*  HGB 8.7* 9.7* 12.0*  HCT 28.2* 30.2* 35.7*  PLT 370 407* PLATELET CLUMPS NOTED ON  SMEAR, COUNT APPEARS ADEQUATE    Coag's  Recent Labs Lab 12/08/15 1039  APTT 34  INR 1.28    Sepsis Markers  Recent Labs Lab 12/14/15 1700 12/14/15 2240  LATICACIDVEN 1.3 2.0  PROCALCITON 0.72  --     ABG No results for input(s): PHART, PCO2ART, PO2ART in the last 168 hours.  Liver Enzymes  Recent Labs Lab 12/08/15 1039 12/08/15 1530  AST 31 35  ALT 48 52  ALKPHOS 103 127*  BILITOT 1.0 1.6*  ALBUMIN 2.5* 2.8*    Cardiac Enzymes  Recent Labs Lab 12/08/15 1530 12/09/15 0133  TROPONINI <0.03 0.03    Glucose No results for input(s): GLUCAP in the last 168 hours.  Imaging No results found.   STUDIES:  12/20 2 d>>no tamponade   CULTURES: 12/19 bc x 2>> 12/19 uc>>  ANTIBIOTICS: 12/19 zoysn>> 12/19 vanc>>  SIGNIFICANT EVENTS:   LINES/TUBES:   DISCUSSION: 64 yo aam who was admitted 12/14 with chest pain and negative CC. Developed sob, cough, fever and subsequent imaging reveals lower lobe atx  And moderate pericardial effusion. He is hypotensive(but now in afib) recent steroid use.  D echo is pending. He may have tamponade physiology but heart sounds not muffled, no pulses paradoxical. We will fluid resituate him , check 2 d stat and  consider stress steroids. We may need to move him to ICU.   ASSESSMENT / PLAN:  PULMONARY A: Hypoxia Asthma Possible pna  P:   O2   Vanc/zosyn. No thora, small effusions O2 for sat of 88-92%. IS for atelectasis  CARDIOVASCULAR A:  Hx of CAD Afib rvr Pericardial effusion Hypotension P:  Hold antihypertensives Start stress dose steroids and wean over 4 days  Fluids to increase filling pressures in setting pericardial effusion    RENAL Lab Results  Component Value Date   CREATININE 1.31* 12/14/2015   CREATININE 1.49* 12/13/2015   CREATININE 1.33* 12/11/2015   CREATININE 1.06 03/12/2015   CREATININE 1.15 08/24/2014   CREATININE 1.07 03/20/2014   A:    renal failure improving P:    Discontinue lasix.  GASTROINTESTINAL A:   GI protection P:   PPI  HEMATOLOGIC A:   No acute issue Off all anticoagulants for pericardial effusion P:  Monitor h/h  INFECTIOUS A:   Presumed pna, left loculated effusion WBC but recent steroids. P:   Abx Sputum culture   ENDOCRINE A:   Chronic steroid use for unknown reason. P:   Stress dose steroids wean over 4 days   NEUROLOGIC A:   No acute issues P:   Hold any sedating medications.  FAMILY  - Updates: None at bedside  - Inter-disciplinary family meet or Palliative Care meeting due by:  day 7  Summary: PCCM will sign off.  Richardson Landry Kerstie Agent ACNP Maryanna Shape PCCM Pager [phone] till 3 pm If no answer page [phone] 12/15/2015, 10:20 AM

## 2015-12-16 ENCOUNTER — Inpatient Hospital Stay (HOSPITAL_COMMUNITY): Payer: Commercial Managed Care - HMO

## 2015-12-16 DIAGNOSIS — J90 Pleural effusion, not elsewhere classified: Secondary | ICD-10-CM | POA: Insufficient documentation

## 2015-12-16 DIAGNOSIS — J948 Other specified pleural conditions: Secondary | ICD-10-CM

## 2015-12-16 MED ORDER — DIGOXIN 0.25 MG/ML IJ SOLN
0.2500 mg | Freq: Every day | INTRAMUSCULAR | Status: DC
Start: 2015-12-16 — End: 2015-12-17
  Administered 2015-12-16 – 2015-12-17 (×2): 0.25 mg via INTRAVENOUS
  Filled 2015-12-16 (×2): qty 1

## 2015-12-16 MED ORDER — PREDNISONE 20 MG PO TABS
40.0000 mg | ORAL_TABLET | Freq: Every day | ORAL | Status: DC
  Administered 2015-12-17: 40 mg via ORAL
  Filled 2015-12-16: qty 2

## 2015-12-16 NOTE — Progress Notes (Signed)
PULMONARY / CRITICAL CARE MEDICINE   Name: [name] [name] MRN: [id number] DOB: [birth year]    ADMISSION DATE:  12/08/2015 CONSULTATION DATE:  12/20  REFERRING MD:  Terrence Dupont   CHIEF COMPLAINT: SOB  HISTORY OF PRESENT ILLNESS:   64 yo aam who was admitted 12/14 with chest pain and negative CC. Developed sob, cough, fever and subsequent imaging reveals lower lobe atx  And moderate pericardial effusion. He is hypotensive(but now in afib) recent steroid use.  D echo is pending. He may have tamponade physiology but heart sounds not muffled, no pulses paradoxical. We will fluid resituate him , check 2 d stat and consider stress steroids. We may need to move him to ICU.     SUBJECTIVE:  Feels better  VITAL SIGNS: BP 116/67 mmHg  Pulse 96  Temp(Src) 98 F (36.7 C) (Oral)  Resp 22  Ht 5\' 7"  (1.702 m)  Wt 242 lb 3.2 oz (109.861 kg)  BMI 37.92 kg/m2  SpO2 95%  HEMODYNAMICS:    VENTILATOR SETTINGS:    INTAKE / OUTPUT: I/O last 3 completed shifts: In: 2180 [P.O.:1440; I.V.:240; IV Piggyback:500] Out: [id number] [Urine:1825]  PHYSICAL EXAMINATION: General: WNWDAAM sitting up eating in bed, feels better Neuro:  Inatct HEENT:  No JVD/LAN, oral mucosa full of food Cardiovascular: HSIR no muffles Lungs: CTA Abdomen:soft + bs Musculoskeletal:  intact Skin:  warm  LABS:  BMET  Recent Labs Lab 12/13/15 1924 12/14/15 2303 12/15/15 1125  NA 136 139 141  K 4.7 5.5* 4.7  CL 99* 109 107  CO2 25 21* 26  BUN 24* 27* 20  CREATININE 1.49* 1.31* 1.20  GLUCOSE 131* 135* 118*    Electrolytes  Recent Labs Lab 12/13/15 1924 12/14/15 2303 12/15/15 1125  CALCIUM 9.0 8.4* 8.5*    CBC  Recent Labs Lab 12/11/15 0205 12/13/15 1615 12/15/15 1125  WBC 19.9* 24.0* 16.2*  HGB 9.7* 12.0* 9.8*  HCT 30.2* 35.7* 29.8*  PLT 407* PLATELET CLUMPS NOTED ON SMEAR, COUNT APPEARS ADEQUATE 446*    Coag's No results for input(s): APTT, INR in the last 168 hours.  Sepsis Markers  Recent  Labs Lab 12/14/15 1700 12/14/15 2240  LATICACIDVEN 1.3 2.0  PROCALCITON 0.72  --     ABG No results for input(s): PHART, PCO2ART, PO2ART in the last 168 hours.  Liver Enzymes  Recent Labs Lab 12/15/15 1125  AST 62*  ALT 54  ALKPHOS 182*  BILITOT 0.5  ALBUMIN 1.8*    Cardiac Enzymes No results for input(s): TROPONINI, PROBNP in the last 168 hours.  Glucose No results for input(s): GLUCAP in the last 168 hours.  Imaging No results found.   STUDIES:  12/20 2 d>>no tamponade   CULTURES: 12/19 bc x 2>> 12/19 uc>>  ANTIBIOTICS: 12/19 zoysn>>12/22 12/19 vanc>>12/22 12/22 PO>>  SIGNIFICANT EVENTS:   LINES/TUBES:   DISCUSSION: 64 yo aam who was admitted 12/14 with chest pain and negative CC. Developed sob, cough, fever and subsequent imaging reveals lower lobe atx  And moderate pericardial effusion. He is hypotensive(but now in afib) recent steroid use.  D echo is pending. He may have tamponade physiology but heart sounds not muffled, no pulses paradoxical. We will fluid resituate him , check 2 d stat and consider stress steroids. We may need to move him to ICU.   ASSESSMENT / PLAN:  PULMONARY A: Hypoxia Asthma Possible pna  P:   O2   Vanc/zosyn. Consider change to PO abx and complete 7 days No thora, small effusions O2  for sat of 88-92%. IS for atelectasis  CARDIOVASCULAR A:  Hx of CAD Afib rvr Pericardial effusion Hypotension P:  Hold antihypertensives Change steroids to prednisone and wean to 20 mg daily with further tapering as outpatient. Fluids to increase filling pressures in setting pericardial effusion    RENAL Lab Results  Component Value Date   CREATININE 1.20 12/15/2015   CREATININE 1.31* 12/14/2015   CREATININE 1.49* 12/13/2015   CREATININE 1.06 03/12/2015   CREATININE 1.15 08/24/2014   CREATININE 1.07 03/20/2014   A:    renal failure improving P:   Discontinue lasix.  GASTROINTESTINAL A:   GI protection P:    PPI  HEMATOLOGIC A:   No acute issue Off all anticoagulants for pericardial effusion P:  Monitor h/h  INFECTIOUS A:   Presumed pna, left loculated effusion WBC but recent steroids. P:   Abx Sputum culture   ENDOCRINE A:   Chronic steroid use for unknown reason. P:   Stress dose steroids wean to 20 mg prednisone daily and further wean as opt.   NEUROLOGIC A:   No acute issues P:   Hold any sedating medications.  FAMILY  - Updates: None at bedside  - Inter-disciplinary family meet or Palliative Care meeting due by:  day 7  Summary: taper steroids to 20 mg prednisone daily and remain. Further taper as outpatient.   Richardson Landry Minor ACNP Maryanna Shape PCCM Pager [phone] till 3 pm If no answer page [phone] 12/16/2015, 9:41 AM

## 2015-12-16 NOTE — Progress Notes (Signed)
Subjective:  Patient denies any chest pain. States breathing has improved. Remains in A. fib flutter with moderate ventricular response  Objective:  Vital Signs in the last 24 hours: Temp:  [97.7 F (36.5 C)-98.7 F (37.1 C)] 98 F (36.7 C) (12/22 0400) Pulse Rate:  [96-140] 96 (12/22 0400) Resp:  [20-22] 22 (12/22 0400) BP: (108-127)/(66-72) 116/67 mmHg (12/22 0400) SpO2:  [95 %-100 %] 95 % (12/22 0814) Weight:  [242 lb 3.2 oz (109.861 kg)] 242 lb 3.2 oz (109.861 kg) (12/22 0440)  Intake/Output from previous day: 12/21 0701 - 12/22 0700 In: 1450 [P.O.:1200; IV Piggyback:250] Out: 1325 [Urine:1325] Intake/Output from this shift: Total I/O In: 240 [P.O.:240] Out: 250 [Urine:250]  Physical Exam: Neck: no carotid bruit, no JVD and supple, symmetrical, trachea midline Lungs: Decrease breath sound at bases left more than right Heart: irregularly irregular rhythm, S1, S2 normal and Soft systolic murmur noted Abdomen: soft, non-tender; bowel sounds normal; no masses,  no organomegaly Extremities: No clubbing cyanosis trace edema noted  Lab Results:  Recent Labs  12/13/15 1615 12/15/15 1125  WBC 24.0* 16.2*  HGB 12.0* 9.8*  PLT PLATELET CLUMPS NOTED ON SMEAR, COUNT APPEARS ADEQUATE 446*    Recent Labs  12/14/15 2303 12/15/15 1125  NA 139 141  K 5.5* 4.7  CL 109 107  CO2 21* 26  GLUCOSE 135* 118*  BUN 27* 20  CREATININE 1.31* 1.20   No results for input(s): TROPONINI in the last 72 hours.  Invalid input(s): CK, MB Hepatic Function Panel  Recent Labs  12/15/15 1125  PROT 7.1  ALBUMIN 1.8*  AST 62*  ALT 54  ALKPHOS 182*  BILITOT 0.5   No results for input(s): CHOL in the last 72 hours. No results for input(s): PROTIME in the last 72 hours.  Imaging: Imaging results have been reviewed and No results found.  Cardiac Studies:  Assessment/Plan:  Status post atypical chest pain MI ruled out status post left cath Acute pleuropericarditis questionable  etiology no back on steroids Recurrent atrial fibrillation/flutterwith moderate ventricular response Status post Hypotension  Left lower lobe pneumonia Marked leukocytosis secondary to steroids doubt empyema Acute on chronic anemia rule out GI loss Compensated diastolic congestive heart failure COPD Morbid obesity GERD Osteoarthritis Plan Continue present management Digoxin 0.25 mg IV 1 dose today Check chest x-ray and EKG in a.m. Slow wean off steroids as per CCM  LOS: 8 days    Charolette Forward 12/16/2015, 11:00 AM

## 2015-12-16 NOTE — Progress Notes (Signed)
UR Completed Sheronda Parran Graves-Bigelow, RN,BSN [phone]  

## 2015-12-16 NOTE — Progress Notes (Signed)
Pharmacy Antibiotic Follow-up Note  [name] [name] is a 64 y.o. year-old male admitted on 12/08/2015.  The patient is currently on day 4  For HCAP.  Assessment/Plan: 1. Continue vancomycin and zosyn at current doses for now.  Per CCM note, planning to switch to oral antibiotics tomorrow.  If not switched, will need to check vancomycin trough level.   Temp (24hrs), Avg:98.1 F (36.7 C), Min:97.7 F (36.5 C), Max:98.7 F (37.1 C)   Recent Labs Lab 12/10/15 0410 12/11/15 0205 12/13/15 1615 12/15/15 1125  WBC 19.6* 19.9* 24.0* 16.2*    Recent Labs Lab 12/10/15 0410 12/11/15 0205 12/13/15 1924 12/14/15 2303 12/15/15 1125  CREATININE 1.38* 1.33* 1.49* 1.31* 1.20   Estimated Creatinine Clearance: 71.6 mL/min (by C-G formula based on Cr of 1.2).    No Known Allergies  Antimicrobials this admission: Levaquin 12/15>>12/19  Vanc 12/19>> Zosyn 12/19>>  Levels/dose changes this admission: none  Microbiology results: none  Thank you for allowing pharmacy to be a part of this patient's care.  Uvaldo Rising, BCPS  Clinical Pharmacist Pager [phone]  12/16/2015 1:37 PM

## 2015-12-17 LAB — ANTI-SCLERODERMA ANTIBODY: SCLERODERMA (SCL-70) (ENA) ANTIBODY, IGG: 0.4 AI (ref 0.0–0.9)

## 2015-12-17 LAB — RHEUMATOID FACTOR: Rhuematoid fact SerPl-aCnc: 13.7 IU/mL (ref 0.0–13.9)

## 2015-12-17 LAB — ANTI-DNA ANTIBODY, DOUBLE-STRANDED: DS DNA AB: 15 [IU]/mL — AB (ref 0–9)

## 2015-12-17 LAB — CYCLIC CITRUL PEPTIDE ANTIBODY, IGG/IGA: CCP Antibodies IgG/IgA: 10 units (ref 0–19)

## 2015-12-17 MED ORDER — DIGOXIN 125 MCG PO TABS
0.1250 mg | ORAL_TABLET | Freq: Every day | ORAL | Status: DC
  Administered 2015-12-18: 0.125 mg via ORAL
  Filled 2015-12-17: qty 1

## 2015-12-17 MED ORDER — PREDNISONE 20 MG PO TABS
20.0000 mg | ORAL_TABLET | Freq: Every day | ORAL | Status: DC
  Administered 2015-12-18: 20 mg via ORAL
  Filled 2015-12-17: qty 1

## 2015-12-17 MED ORDER — DIGOXIN 125 MCG PO TABS: 0.1250 mg | ORAL_TABLET | Freq: Every day | ORAL | Status: DC

## 2015-12-17 MED ORDER — LEVOFLOXACIN 500 MG PO TABS
500.0000 mg | ORAL_TABLET | Freq: Every day | ORAL | Status: DC
Start: 2015-12-17 — End: 2015-12-18
  Administered 2015-12-17 – 2015-12-18 (×2): 500 mg via ORAL
  Filled 2015-12-17 (×2): qty 1

## 2015-12-17 NOTE — Progress Notes (Signed)
Occupational Therapy Treatment Patient Details Name: WATTS WIRKKALA MRN: [id number] DOB: 08-28-1951 Today's Date: 12/17/2015    History of present illness 64 y.o. male with onset of PNA with LLL infiltrate was admitted, has leukocytosis, cleared for MI, on testing and has contact precautions   OT comments  Pt progressing. Per pt his plan is home, and sounds like he is refusing SNF, so if he continues to refuse, recommend HHOT. Education provided in session.   Follow Up Recommendations  SNF    Equipment Recommendations  3 in 1 bedside comode    Recommendations for Other Services      Precautions / Restrictions Precautions Precautions: Fall Precaution Comments: watch HR Restrictions Weight Bearing Restrictions: No       Mobility Bed Mobility               General bed mobility comments: pt in chair  Transfers Overall transfer level: Needs assistance   Transfers: Sit to/from Stand Sit to Stand: Min guard         General transfer comment: RW in front for support    Balance                                   ADL Overall ADL's : Needs assistance/impaired     Grooming: Applying deodorant;Set up;Supervision/safety;Standing (placed deodorant at sink)               Lower Body Dressing: Minimal assistance;With adaptive equipment;Sitting/lateral leans (donned/doffed sock)   Toilet Transfer: Min guard;Ambulation;RW (sit to stand from chair)           Functional mobility during ADLs: Min guard;Rolling walker General ADL Comments: Educated on energy conservation techniques. Educated on safety such as recommended not getting all the way down in his tub at home, and use of bag on walker. Educated on AE and pt practiced.       Vision                     Perception     Praxis      Cognition   Awake/Alert  Behavior During Therapy: WFL for tasks assessed/performed Overall Cognitive Status: Within Functional Limits for tasks  assessed                       Extremity/Trunk Assessment               Exercises     Shoulder Instructions       General Comments      Pertinent Vitals/ Pain       Pain Assessment: No/denies pain  Home Living                                          Prior Functioning/Environment              Frequency Min 2X/week     Progress Toward Goals  OT Goals(current goals can now be found in the care plan section)  Progress towards OT goals: Progressing toward goals  Acute Rehab OT Goals 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 OT Goal Formulation: With patient Time For Goal Achievement: 12/24/15 Potential to Achieve Goals: Good ADL Goals Pt Will Perform Grooming: with modified independence;standing (2 activities) Pt Will Perform Lower Body Bathing: with modified independence;with  adaptive equipment;sit to/from stand Pt Will Perform Lower Body Dressing: with modified independence;with adaptive equipment;sit to/from stand Pt Will Transfer to Toilet: with modified independence;ambulating;bedside commode (over toilet as needed) Pt Will Perform Toileting - Clothing Manipulation and hygiene: with modified independence;sit to/from stand Pt Will Perform Tub/Shower Transfer: Tub transfer;with modified independence;ambulating;3 in 1 Additional ADL Goal #1: Pt will utilize energy conservation strategies and breathing techniques in ADL and mobility.  Plan Discharge plan remains appropriate    Co-evaluation                 End of Session Equipment Utilized During Treatment: Gait belt;Rolling walker   Activity Tolerance Patient tolerated treatment well   Patient Left in chair;with call bell/phone within reach   Nurse Communication          Time: [id number] OT Time Calculation (min): 20 min  Charges: OT General Charges $OT Visit: 1 Procedure OT Treatments $Self Care/Home Management : 8-22 mins  Benito Mccreedy  OTR/L [id number] 12/17/2015, 11:40 AM

## 2015-12-17 NOTE — Progress Notes (Signed)
Physical Therapy Treatment Patient Details Name: [name] [name] MRN: [id number] DOB: 12-14-1951 Today's Date: 12/17/2015    History of Present Illness 64 y.o. male with onset of PNA with LLL infiltrate was admitted, has leukocytosis, cleared for MI,  contact precautions    PT Comments    From a mobility standpoint pt has likely improved enough that with some home support he would be able to manage mobility at home. Pt also states he wants to go home and not to a facility.  I have encouraged the patient to gradually increase activity daily to tolerance and continue to ambulate with nursing staff while here in the hospital.      Follow Up Recommendations  Home health PT     Equipment Recommendations  Rolling walker with 5" wheels    Recommendations for Other Services       Precautions / Restrictions Precautions Precautions: Fall Precaution Comments: watch HR Restrictions Weight Bearing Restrictions: No    Mobility  Bed Mobility Overal bed mobility:  (NT, pt up in chair and did not want to return to bed)             General bed mobility comments: pt in chair  Transfers Overall transfer level: Needs assistance Equipment used: Rolling walker (2 wheeled) Transfers: Sit to/from Stand Sit to Stand: Supervision         General transfer comment: pt demonstrated proper technique  Ambulation/Gait Ambulation/Gait assistance: Supervision Ambulation Distance (Feet): 30 Feet (Pt reported walking around the nursing unit for long distanc) Assistive device: None Gait Pattern/deviations: Decreased stride length;Wide base of support   Gait velocity interpretation: <1.8 ft/sec, indicative of risk for recurrent falls General Gait Details: No balance losses ambulating around the room without RW   Stairs            Wheelchair Mobility    Modified Rankin (Stroke Patients Only)       Balance Overall balance assessment: Needs assistance         Standing balance  support: No upper extremity supported Standing balance-Leahy Scale: Good               High level balance activites: Backward walking;Direction changes;Turns;Other (comment) (standing with eyes closed) High Level Balance Comments: Performed simulated home environment with pt today performing various tasks in the room.  He did not use the RW and did not lose his balance.     Cognition Arousal/Alertness: Awake/alert Behavior During Therapy: WFL for tasks assessed/performed Overall Cognitive Status: Within Functional Limits for tasks assessed                      Exercises General Exercises - Lower Extremity Ankle Circles/Pumps: AROM;Left;20 reps;Seated Long Arc Quad: AROM;Both;10 reps;Seated Hip Flexion/Marching: AROM;Both;Other reps (comment);Seated;Standing (90 seconds each in both sitting and standing)    General Comments General comments (skin integrity, edema, etc.): Pt states he is not interested in going to SNF/ALF. He feels like he could manage at home. Discussed home safety, including use of RW at home for increased safety. 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

## 2015-12-17 NOTE — Progress Notes (Signed)
Subjective:  Patient denies any chest pain states breathing has improved wanted to be treated conservatively remains in atrial flutter with controlled ventricular response  Objective:  Vital Signs in the last 24 hours: Temp:  [97.3 F (36.3 C)-97.8 F (36.6 C)] 97.3 F (36.3 C) (12/23 [id number]) Pulse Rate:  [69-91] 76 (12/23 0638) Resp:  [18] 18 (12/22 1413) BP: (115-123)/(52-80) 116/52 mmHg (12/23 0638) SpO2:  [96 %-100 %] 99 % (12/23 0805) Weight:  [241 lb 1.6 oz (109.362 kg)] 241 lb 1.6 oz (109.362 kg) (12/23 0355)  Intake/Output from previous day: 12/22 0701 - 12/23 0700 In: 480 [P.O.:480] Out: 500 [Urine:500] Intake/Output from this shift: Total I/O In: 120 [P.O.:120] Out: -   Physical Exam: Neck: no adenopathy, no carotid bruit, no JVD and supple, symmetrical, trachea midline Lungs: Decreased breath sound at bases air entry improved Heart: regularly irregular rhythm, S1, S2 normal and Soft systolic murmur noted Abdomen: soft, non-tender; bowel sounds normal; no masses,  no organomegaly Extremities: extremities normal, atraumatic, no cyanosis or edema  Lab Results:  Recent Labs  12/15/15 1125  WBC 16.2*  HGB 9.8*  PLT 446*    Recent Labs  12/14/15 2303 12/15/15 1125  NA 139 141  K 5.5* 4.7  CL 109 107  CO2 21* 26  GLUCOSE 135* 118*  BUN 27* 20  CREATININE 1.31* 1.20   No results for input(s): TROPONINI in the last 72 hours.  Invalid input(s): CK, MB Hepatic Function Panel  Recent Labs  12/15/15 1125  PROT 7.1  ALBUMIN 1.8*  AST 62*  ALT 54  ALKPHOS 182*  BILITOT 0.5   No results for input(s): CHOL in the last 72 hours. No results for input(s): PROTIME in the last 72 hours.  Imaging: Imaging results have been reviewed and Dg Chest Port 1 View  12/16/2015  CLINICAL DATA:  Evaluate left pleural effusion. EXAM: PORTABLE CHEST 1 VIEW COMPARISON:  12/13/2015 FINDINGS: Stable enlargement of the cardiac silhouette. Patient has known pericardial fluid  from the recent CT. Stable pleural-based densities at both lung bases suggestive for pleural effusions, left side greater than right. Minimal change from the previous examination. Stable linear densities in the left lung. Trachea is midline. IMPRESSION: Stable chest findings. Persistent pleural effusions, left side greater than right. Stable linear densities throughout the left lung. Stable enlargement of the cardiac silhouette as described. Electronically Signed   By: Markus Daft M.D.   On: 12/16/2015 13:47    Cardiac Studies:  Assessment/Plan:  Status post atypical chest pain MI ruled out status post left cath Acute pleuropericarditis questionable etiology no back on steroids Recurrent atrial fibrillation/flutterwith moderate ventricular response Status post Hypotension  Resolving Left lower lobe pneumonia Marked leukocytosis secondary to steroids doubt empyema Acute on chronic anemia rule out GI loss Compensated diastolic congestive heart failure COPD Morbid obesity GERD Osteoarthritis Plan Change IV digoxin to by mouth Check labs in a.m. Check EKG in a.m. Possible discharge tomorrow if stable  LOS: 9 days    Charolette Forward 12/17/2015, 12:50 PM

## 2015-12-17 NOTE — Progress Notes (Addendum)
PULMONARY / CRITICAL CARE MEDICINE   Name: [name] [name] MRN: [id number] DOB: 1951-01-17    ADMISSION DATE:  12/08/2015 CONSULTATION DATE:  12/20  REFERRING MD:  Terrence Dupont   CHIEF COMPLAINT: SOB  HISTORY OF PRESENT ILLNESS:   64 yo aam who was admitted 12/14 with chest pain and negative CC. Developed sob, cough, fever and subsequent imaging reveals lower lobe atx  And moderate pericardial effusion. He is hypotensive(but now in afib) recent steroid use.  D echo is pending. He may have tamponade physiology but heart sounds not muffled, no pulses paradoxical. We will fluid resituate him , check 2 d stat and consider stress steroids. We may need to move him to ICU.     STUDIES:  12/20 2 d>>no tamponade   CULTURES: 12/19 bc x 2>> 12/19 uc>>  ANTIBIOTICS: 12/19 zoysn>>12/22 12/19 vanc>>12/22 12/22 PO>>    SUBJECTIVE:  12/17/15 - feelnign better and non-toxic. Afraid of procedures and blood draws. CXR visualized - likely small to moderate left effusion . ANA and RF - negative. Rest pending  VITAL SIGNS: BP 116/52 mmHg  Pulse 76  Temp(Src) 97.3 F (36.3 C) (Oral)  Resp 18  Ht 5\' 7"  (1.702 m)  Wt 109.362 kg (241 lb 1.6 oz)  BMI 37.75 kg/m2  SpO2 99%  HEMODYNAMICS:    VENTILATOR SETTINGS:    INTAKE / OUTPUT: I/O last 3 completed shifts: In: 970 [P.O.:720; IV Piggyback:250] Out: 1175 [id number]  PHYSICAL EXAMINATION: General: WNWDAAM standing. Looks real well Neuro:  Inatct. AxOX3. CAM-ICU nega for delirium. Normal gait HEENT:  No JVD/LAN, oral mucosa full of food Cardiovascular: HSIR no muffles Lungs: CTA but left base is dminished Abdomen:soft + bs Musculoskeletal:  intact Skin:  warm  LABS:  BMET  Recent Labs Lab 12/13/15 1924 12/14/15 2303 12/15/15 1125  NA 136 139 141  K 4.7 5.5* 4.7  CL 99* 109 107  CO2 25 21* 26  BUN 24* 27* 20  CREATININE 1.49* 1.31* 1.20  GLUCOSE 131* 135* 118*    Electrolytes  Recent Labs Lab 12/13/15 1924  12/14/15 2303 12/15/15 1125  CALCIUM 9.0 8.4* 8.5*    CBC  Recent Labs Lab 12/11/15 0205 12/13/15 1615 12/15/15 1125  WBC 19.9* 24.0* 16.2*  HGB 9.7* 12.0* 9.8*  HCT 30.2* 35.7* 29.8*  PLT 407* PLATELET CLUMPS NOTED ON SMEAR, COUNT APPEARS ADEQUATE 446*    Coag's No results for input(s): APTT, INR in the last 168 hours.  Sepsis Markers  Recent Labs Lab 12/14/15 1700 12/14/15 2240  LATICACIDVEN 1.3 2.0  PROCALCITON 0.72  --     ABG No results for input(s): PHART, PCO2ART, PO2ART in the last 168 hours.  Liver Enzymes  Recent Labs Lab 12/15/15 1125  AST 62*  ALT 54  ALKPHOS 182*  BILITOT 0.5  ALBUMIN 1.8*    Cardiac Enzymes No results for input(s): TROPONINI, PROBNP in the last 168 hours.  Glucose No results for input(s): GLUCAP in the last 168 hours.  Imaging Dg Chest Port 1 View  12/16/2015  CLINICAL DATA:  Evaluate left pleural effusion. EXAM: PORTABLE CHEST 1 VIEW COMPARISON:  12/13/2015 FINDINGS: Stable enlargement of the cardiac silhouette. Patient has known pericardial fluid from the recent CT. Stable pleural-based densities at both lung bases suggestive for pleural effusions, left side greater than right. Minimal change from the previous examination. Stable linear densities in the left lung. Trachea is midline. IMPRESSION: Stable chest findings. Persistent pleural effusions, left side greater than right. Stable linear densities throughout the  left lung. Stable enlargement of the cardiac silhouette as described. Electronically Signed   By: Markus Daft M.D.   On: 12/16/2015 13:47   SIGNIFICANT EVENTS:   ASSESSMENT / PLAN:  PULMONARY A: Likely LLL HCAP + small reactive left pleural effusion VS Viral Pericarditis/Pleuritis following recent admission  P:   Offered thora for left effusion -> he refused. Scared of risks though small. Altnerative risk of empyema explained. Still wants to wait. Given opd fu with CXR in 2-3 weeks. He prefers  latter  Change IV to po levaquin x  5days(QTc 454 x 2 days ago). He is on amio and will do EKG - ordeerd for 12/18/15 . Cards to track QTc   Prednisone - this was never his chronic med. I have reduced it to 20mg  per day. Cards to send him out on 7-12 day taper  Await autoimmune  Future Appointments Date Time Provider [place]  12/23/2015 10:30 AM Arnoldo Morale, MD [facility] None  01/11/2016 9:30 AM Melvenia Needles, NP [facility] None  03/08/2016 11:00 AM Dennie Bible, NP [facility] None  05/23/2016 10:15 AM Renato Shin, MD [facility] None    PCCM will sign off     Dr. Brand Males, M.D., [facility].C.P Pulmonary and Critical Care Medicine Staff Physician [facility] Pager: [phone], If no answer or between  15:00h - 7:00h: call [phone]  12/17/2015 10:27 AM

## 2015-12-17 NOTE — Care Management Important Message (Signed)
Important Message  Patient Details  Name: [name] [name] MRN: [id number] Date of Birth: 1951/01/11   Medicare Important Message Given:  Yes    Bethena Roys, RN 12/17/2015, 1:30 PM

## 2015-12-18 LAB — BASIC METABOLIC PANEL
Anion gap: 10 (ref 5–15)
BUN: 17 mg/dL (ref 6–20)
CHLORIDE: 110 mmol/L (ref 101–111)
CO2: 21 mmol/L — ABNORMAL LOW (ref 22–32)
Calcium: 8.9 mg/dL (ref 8.9–10.3)
Creatinine, Ser: 1.1 mg/dL (ref 0.61–1.24)
GFR calc Af Amer: >60 mL/min (ref >60)
GFR calc non Af Amer: >60 mL/min (ref >60)
GLUCOSE: 99 mg/dL (ref 65–99)
POTASSIUM: 3.9 mmol/L (ref 3.5–5.1)
Sodium: 141 mmol/L (ref 135–145)

## 2015-12-18 LAB — CBC
HEMATOCRIT: 38.1 % — AB (ref 39.0–52.0)
Hemoglobin: 11.8 g/dL — ABNORMAL LOW (ref 13.0–17.0)
MCH: 20.9 pg — ABNORMAL LOW (ref 26.0–34.0)
MCHC: 31 g/dL (ref 30.0–36.0)
MCV: 67.6 fL — AB (ref 78.0–100.0)
Platelets: 511 10*3/uL — ABNORMAL HIGH (ref 150–400)
RBC: 5.64 MIL/uL (ref 4.22–5.81)
RDW: 16 % — AB (ref 11.5–15.5)
WBC: 13 10*3/uL — ABNORMAL HIGH (ref 4.0–10.5)

## 2015-12-18 MED ORDER — AMIODARONE HCL 200 MG PO TABS: 200.0000 mg | ORAL_TABLET | Freq: Two times a day (BID) | ORAL | Status: DC

## 2015-12-18 MED ORDER — LEVOFLOXACIN 500 MG PO TABS: 500.0000 mg | ORAL_TABLET | Freq: Every day | ORAL | Status: DC

## 2015-12-18 MED ORDER — DIGOXIN 125 MCG PO TABS: 0.1250 mg | ORAL_TABLET | Freq: Every day | ORAL | Status: DC

## 2015-12-18 MED ORDER — METOPROLOL SUCCINATE ER 25 MG PO TB24: 25.0000 mg | ORAL_TABLET | Freq: Every day | ORAL | Status: DC

## 2015-12-18 MED ORDER — COLCHICINE 0.6 MG PO TABS: 0.6000 mg | ORAL_TABLET | Freq: Two times a day (BID) | ORAL | Status: DC

## 2015-12-18 MED ORDER — ATORVASTATIN CALCIUM 40 MG PO TABS: 40.0000 mg | ORAL_TABLET | Freq: Every day | ORAL | Status: DC

## 2015-12-18 NOTE — Progress Notes (Signed)
HH has been ordered. Equipment is in pt room to go home with him. He has all discharge paperwork, reviewed extensively.

## 2015-12-18 NOTE — Discharge Summary (Signed)
[name] [name], [name] [name]                ACCOUNT NO.:  [id number]  MEDICAL RECORD NO.:  [id number]  LOCATION:  3W25C                        FACILITY:  [facility]  PHYSICIAN:  Tanesia Butner N. Terrence Dupont, M.D. DATE OF BIRTH:  03-May-1951  DATE OF ADMISSION:  12/08/2015 DATE OF DISCHARGE:  12/18/2015                              DISCHARGE SUMMARY   ADMITTING DIAGNOSES: 1. Atypical chest pain with new EKG changes, rule out ST elevation     myocardial infarction. 2. Hypertension. 3. History of paroxysmal atrial fibrillation. 4. Compensated diastolic congestive heart failure. 5. Chronic obstructive pulmonary disease. 6. Morbid obesity. 7. Gastroesophageal reflux disease. 8. Osteoarthritis. 9. Mild volume overload.  DISCHARGE DIAGNOSES: 1. Status post atypical chest pain, myocardial infarction ruled out,     status post left cardiac cath. 2. Mild coronary artery disease. 3. Acute pleural pericarditis, probably viral. 4. Recurrent atrial fibrillation/flutter with controlled ventricular     response. 5. Resolving left lower lobe pneumonia. 6. Resolving marked leukocytosis. 7. Acute on chronic anemia, stable. 8. Compensated diastolic congestive heart failure. 9. Chronic obstructive pulmonary disease. 10.Morbid obesity. 11.Gastroesophageal reflux disease. 12.Osteoarthritis.  DISCHARGE MEDICATIONS: 1. Atorvastatin 40 mg 1 tablet daily. 2. Colchicine 0.6 mg twice daily. 3. Digoxin 0.125 mg daily. 4. Metoprolol succinate 25 mg 1 tablet daily. 5. Albuterol inhaler 2 puffs every 6 hours. 6. Aspirin 81 mg 1 tablet daily. 7. Ferrous sulfate 325 mg daily. 8. Advair HFA 2 puffs twice daily as before. 9. Keppra 750 mg 2 tablets 2 times daily as before. 10.Zestril 20 mg 1 tablets daily. 11.Nitrostat sublingual p.r.n. 12.Omeprazole 40 mg 1 capsule daily. 13.Prednisone 20 mg 1 tablet daily, which will be tapered down as     outpatient. 14.Amiodarone 200 mg twice daily. 15.Lotrisone cream apply locally as  before. 16.Levofloxacin 500 mg 1 tablet daily for 7 more days. 17.Vitamin D 1 capsule daily. 18.The patient has been advised to stop metoclopramide 5 mg and also     Revatio, and Clomid.  DIET:  Low salt, low cholesterol.  ACTIVITY:  Increase activity slowly as tolerated.  CONDITION AT DISCHARGE:  Stable.  BRIEF HISTORY AND HOSPITAL COURSE:  [name] is a 64 year old male with past medical history significant for hypertension, history of congestive heart failure secondary to diastolic dysfunction, history of paroxysmal AFib, hyperlipidemia, GERD, morbid obesity, history of COPD, osteoarthritis, he came to the office initially complaining of shortness of breath.  He was recently discharged from the hospital.  He ran out of his albuterol, also complained of pleuritic chest pain.  The patient called EMS and was referred to EKG.  EKG done in the ED showed normal sinus rhythm with diffuse ST elevation in anterolateral leads and minimal ST elevation in inferior leads.  This was new as compared to the prior EKG done approximately 10 days ago.  Code STEMI was called.  The patient was transferred to the cath lab emergently.  The patient seen in the cath lab, complaining of vague pleuritic chest pain associated with shortness of breath.  Discussed with patient briefly regarding new EKG changes and left cath possible PTCA stenting, its risks and benefits and consents for PCI.  PHYSICAL EXAMINATION:  GENERAL:  He was alert, awake, oriented x3. Hemodynamically stable. HEENT:  Conjunctivae are pink. NECK:  Supple.  No JVD.  No bruit. LUNGS:  Decreased breath sounds at bases with faint wheezing. CARDIOVASCULAR:  S1, S2 was normal.  There was soft systolic murmur. ABDOMEN:  Soft, obese, mildly distended, nontender. EXTREMITIES:  There is no clubbing or cyanosis.  There was 2+ edema noted. NEUROLOGIC:  Grossly intact.  LABORATORY DATA:  His sodium was 136, potassium 3.8, BUN 17,  creatinine 1.58. Hemoglobin was 10.3, hematocrit 32.8, white count of 25.9.  Of note, the patient was on steroids recently which was discontinued a few days ago.  His cholesterol was 144, triglycerides 53, HDL 57, LDL was 76.  Repeat electrolytes post cardiac catheterization, BUN 23, creatinine 1.38.  Repeat hemoglobin was 8.7, hematocrit 28.2, white count of 19.6, which is trending down.  His troponin-I were negative.  D- dimer was slightly elevated 3.07, subsequently the patient had V/Q scan, which was low probability for pulmonary embolism.  The patient had CT of the chest done on December 20, which showed moderate pericardial effusion, suspected mild interstitial edema, small to moderate left and small right pleural effusion, patchy bilateral lower lobe opacities, likely compressive atelectasis.  The patient subsequently had 2D echo, which showed good LV systolic function and mild pericardial effusion with no evidence of cardiac tamponade.  Last labs today, sodium is 141, potassium 3.9, BUN 17, creatinine 1.10.  Hemoglobin is 11.8, hematocrit 38.1, white count was trending down to 13,000.  His lactic acid was 2.0, procalcitonin was 0.72.  BRIEF HOSPITAL COURSE:  The patient was emergently taken to the cardiac cath lab and subsequently underwent left cardiac cath with selective left and right coronary angiography and LV graphy as per procedure report.  Postprocedure, the patient went into atrial fib with RVR, subsequently required IV amiodarone and was converted back into sinus rhythm.  The patient had persistent leukocytosis, subsequently underwent repeat chest x-ray, which showed enlargement of cardiac silhouette with bilateral effusion and infiltrate on chest x-ray, subsequently requiring CT of the chest, which showed mild to moderate pericardial effusion and mild to moderate left pleural and small right pleural effusion.  CCM consultation was also obtained.  The patient had  recurrent episodes of AFib, flutter requiring bolus of IV amiodarone and dose was increased. Amiodarone dose was increased to 400 mg twice daily with control of his heart rate, but remained in atrial flutter with 4-1 block.  The patient remained afebrile during the hospital stay.  2D echo was done which showed only mild pericardial effusion with no evidence of tamponade. Vasculitis workup was done by CCM, results are still pending.  The patient's breathing improved after stress dose of steroids and he is maintained on steroids and colchicine for pleuropericarditis. Clinically, the patient has improved significantly.  The patient's OT and PT consult was obtained.  The patient is ambulating in room without any problems.  The patient will be discharged home on above medications and will be followed up as outpatient.  The patient refused for any invasive workup i.e. refused for DC cardioversion and also refused for thoracentesis and wanted to be treated medically.  The patient will have repeat chest x-ray in 2-3 weeks to evaluate left pleural effusion.  The patient was started initially on Xarelto, which was discontinued in view of pericarditis and pleural effusion and risk of conversion to hemorrhagic pericarditis.  The patient's hemoglobin has been stable for last few days.  The patient will be discharged home  on above medications and will be followed up in my office in 1 week and Pulmonary as scheduled as outpatient, and also his PMD as scheduled.     Allegra Lai. Terrence Dupont, M.D.     MNH/MEDQ  D:  12/18/2015  T:  12/18/2015  Job:  [id number]

## 2015-12-18 NOTE — Progress Notes (Signed)
Pt discharged home with friend. RN expressed serious concerns about pt getting medications tonight. RN administered pt medications that are due for the evening. RN advised pt that the [facility] on [address] will be open tomorrow, and the importance of picking up all medications. PT verbalized understanding

## 2015-12-18 NOTE — Progress Notes (Signed)
Patient refused his labs this morning. Patient understands purpose of the ordered labs and also understands his right to refuse. Will continue to monitor.

## 2015-12-18 NOTE — Plan of Care (Signed)
Problem: Physical Regulation: Goal: Ability to maintain clinical measurements within normal limits will improve Outcome: Progressing All vital signs within normal limits. Patient is currently refusing lab draws so certain measurements such as WBC are unable to be obtained. Goal: Will remain free from infection Outcome: Progressing Patient was being treated for pneumonia with IV antibiotics which have now been switched to PO levaquin. Patient is also positive for MRSA in the nares; infection prevention measures including PPE and hand hygiene have been implemented.   Problem: Fluid Volume: Goal: Ability to maintain a balanced intake and output will improve Outcome: Progressing Patient has dependent edema in the legs and a known left pleural effusion. Patient's intake and output are being strictly measured.   Problem: Nutrition: Goal: Adequate nutrition will be maintained Outcome: Completed/Met Date Met:  12/18/15 Patient has maintained adequate nutrition while in the hospital; patient has no complaints of decreased appetite.  Problem: Bowel/Gastric: Goal: Will not experience complications related to bowel motility Outcome: Progressing Patient has no complaints of constipation; reports that current bowel pattern is within his baseline.

## 2015-12-18 NOTE — Discharge Instructions (Signed)
Atrial Fibrillation °Atrial fibrillation is a type of irregular or rapid heartbeat (arrhythmia). In atrial fibrillation, the heart quivers continuously in a chaotic pattern. This occurs when parts of the heart receive disorganized signals that make the heart unable to pump blood normally. This can increase the risk for stroke, heart failure, and other heart-related conditions. There are different types of atrial fibrillation, including: °· Paroxysmal atrial fibrillation. This type starts suddenly, and it usually stops on its own shortly after it starts. °· Persistent atrial fibrillation. This type often lasts longer than a week. It may stop on its own or with treatment. °· Long-lasting persistent atrial fibrillation. This type lasts longer than 12 months. °· Permanent atrial fibrillation. This type does not go away. °Talk with your health care provider to learn about the type of atrial fibrillation that you have. °CAUSES °This condition is caused by some heart-related conditions or procedures, including: °· A heart attack. °· Coronary artery disease. °· Heart failure. °· Heart valve conditions. °· High blood pressure. °· Inflammation of the sac that surrounds the heart (pericarditis). °· Heart surgery. °· Certain heart rhythm disorders, such as Wolf-Parkinson-White syndrome. °Other causes include: °· Pneumonia. °· Obstructive sleep apnea. °· Blockage of an artery in the lungs (pulmonary embolism, or PE). °· Lung cancer. °· Chronic lung disease. °· Thyroid problems, especially if the thyroid is overactive (hyperthyroidism). °· Caffeine. °· Excessive alcohol use or illegal drug use. °· Use of some medicines, including certain decongestants and diet pills. °Sometimes, the cause cannot be found. °RISK FACTORS °This condition is more likely to develop in: °· People who are older in age. °· People who smoke. °· People who have diabetes mellitus. °· People who are overweight (obese). °· Athletes who exercise  vigorously. °SYMPTOMS °Symptoms of this condition include: °· A feeling that your heart is beating rapidly or irregularly. °· A feeling of discomfort or pain in your chest. °· Shortness of breath. °· Sudden light-headedness or weakness. °· Getting tired easily during exercise. °In some cases, there are no symptoms. °DIAGNOSIS °Your health care provider may be able to detect atrial fibrillation when taking your pulse. If detected, this condition may be diagnosed with: °· An electrocardiogram (ECG). °· A Holter monitor test that records your heartbeat patterns over a 24-hour period. °· Transthoracic echocardiogram (TTE) to evaluate how blood flows through your heart. °· Transesophageal echocardiogram (TEE) to view more detailed images of your heart. °· A stress test. °· Imaging tests, such as a CT scan or chest X-ray. °· Blood tests. °TREATMENT °The main goals of treatment are to prevent blood clots from forming and to keep your heart beating at a normal rate and rhythm. The type of treatment that you receive depends on many factors, such as your underlying medical conditions and how you feel when you are experiencing atrial fibrillation. °This condition may be treated with: °· Medicine to slow down the heart rate, bring the heart's rhythm back to normal, or prevent clots from forming. °· Electrical cardioversion. This is a procedure that resets your heart's rhythm by delivering a controlled, low-energy shock to the heart through your skin. °· Different types of ablation, such as catheter ablation, catheter ablation with pacemaker, or surgical ablation. These procedures destroy the heart tissues that send abnormal signals. When the pacemaker is used, it is placed under your skin to help your heart beat in a regular rhythm. °HOME CARE INSTRUCTIONS °· Take over-the counter and prescription medicines only as told by your health care provider. °·   If your health care provider prescribed a blood-thinning medicine  (anticoagulant), take it exactly as told. Taking too much blood-thinning medicine can cause bleeding. If you do not take enough blood-thinning medicine, you will not have the protection that you need against stroke and other problems.  Do not use tobacco products, including cigarettes, chewing tobacco, and e-cigarettes. If you need help quitting, ask your health care provider.  If you have obstructive sleep apnea, manage your condition as told by your health care provider.  Do not drink alcohol.  Do not drink beverages that contain caffeine, such as coffee, soda, and tea.  Maintain a healthy weight. Do not use diet pills unless your health care provider approves. Diet pills may make heart problems worse.  Follow diet instructions as told by your health care provider.  Exercise regularly as told by your health care provider.  Keep all follow-up visits as told by your health care provider. This is important. PREVENTION  Avoid drinking beverages that contain caffeine or alcohol.  Avoid certain medicines, especially medicines that are used for breathing problems.  Avoid certain herbs and herbal medicines, such as those that contain ephedra or ginseng.  Do not use illegal drugs, such as cocaine and amphetamines.  Do not smoke.  Manage your high blood pressure. SEEK MEDICAL CARE IF:  You notice a change in the rate, rhythm, or strength of your heartbeat.  You are taking an anticoagulant and you notice increased bruising.  You tire more easily when you exercise or exert yourself. SEEK IMMEDIATE MEDICAL CARE IF:  You have chest pain, abdominal pain, sweating, or weakness.  You feel nauseous.  You notice blood in your vomit, bowel movement, or urine.  You have shortness of breath.  You suddenly have swollen feet and ankles.  You feel dizzy.  You have sudden weakness or numbness of the face, arm, or leg, especially on one side of the body.  You have trouble speaking,  trouble understanding, or both (aphasia).  Your face or your eyelid droops on one side. These symptoms may represent a serious problem that is an emergency. Do not wait to see if the symptoms will go away. Get medical help right away. Call your local emergency services (911 in the [place] Do not drive yourself to the hospital.   This information is not intended to replace advice given to you by your health care provider. Make sure you discuss any questions you have with your health care provider.   Document Released: 12/11/2005 Document Revised: 09/01/2015 Document Reviewed: 04/07/2015 Elsevier Interactive Patient Education 2016 Else[employer] Inc. Pericarditis Pericarditis is swelling (inflammation) of the pericardium. The pericardium is a thin, double-layered, fluid-filled tissue sac that surrounds the heart. The purpose of the pericardium is to contain the heart in the chest cavity and keep the heart from overexpanding. Different types of pericarditis can occur, such as:  Acute pericarditis. Inflammation can develop suddenly in acute pericarditis.  Chronic pericarditis. Inflammation develops gradually and is long-lasting in chronic pericarditis.  Constrictive pericarditis. In this type of pericarditis, the layers of the pericardium stiffen and develop scar tissue. The scar tissue thickens and sticks together. This makes it difficult for the heart to pump and work as it normally does. CAUSES  Pericarditis can be caused from different conditions, such as:  A bacterial, fungal or viral infection.  After a heart attack (myocardial infarction).  After open-heart surgery (coronary bypass graft surgery).  Auto-immune conditions such as lupus, rheumatoid arthritis or scleroderma.  Kidney failure.  Low thyroid condition (hypothyroidism).  Cancer from another part of the body that has spread (metastasized) to the pericardium.  Chest injury or trauma.  After radiation treatment.  Certain  medicines. SYMPTOMS  Symptoms of pericarditis can include:  Chest pain. Chest pain symptoms may increase when laying down and may be relieved when sitting up and leaning forward.  A chronic, dry cough.  Heart palpitations. These may feel like rapid, fluttering or pounding heart beats.  Chest pain may be worse when swallowing.  Dizziness or fainting.  Tiredness, fatigue or lethargy.  Fever. DIAGNOSIS  Pericarditis is diagnosed by the following:  A physical exam. A heart sound called a pericardial friction rub may be heard when your caregiver listens to your heart.  Blood work. Blood may be drawn to check for an infection and to look at your blood chemistry.  Electrocardiography. During electrocardiography your heart's electrical activity is monitored and recorded with a tracing on paper (electrocardiogram [ECG]).  Echocardiography.  Computed tomography (CT).  Magnetic resonance image (MRI). TREATMENT  To treat pericarditis, it is important to know the cause of it. The cause of pericarditis determines the treatment.   If the cause of pericarditis is due to an infection, treatment is based on the type of infection. If an infection is suspected in the pericardial fluid, a procedure called a pericardial fluid culture and biopsy may be done. This takes a sample of the pericardial fluid. The sample is sent to a lab which runs tests on the pericardial fluid to check for an infection.  If the autoimmune disease is the cause, treatment of the autoimmune condition will help improve the pericarditis.  If the cause of pericarditis is not known, anti-inflammatory medicines may be used to help decrease the inflammation.  Surgery may be needed. The following are types of surgeries or procedures that may be done to treat pericarditis:  Pericardial window. A pericardial window makes a cut (incision) into the pericardial sac. This allows excess fluid in the pericardium to  drain.  Pericardiocentesis. A pericardiocentesis is also known as a pericardial tap. This procedure uses a needle that is guided by X-ray to drain (aspirate) excess fluid from the pericardium.  Pericardiectomy. A pericardiectomy removes part or all of the pericardium. HOME CARE INSTRUCTIONS   Do not smoke. If you smoke, quit. Your caregiver can help you quit smoking.  Maintain a healthy weight.  Follow an exercise program as directed by your health care provider. You may need to limit your exercising until your symptoms go away.  If you drink alcohol, do so in moderation.  Eat a heart healthy diet. A registered dietitian can help you learn about healthy food choices.  Keep a list of all your medicines with you at all times. Include the name, dose, how often it is taken and how it is taken. SEEK IMMEDIATE MEDICAL CARE IF:   You have chest pain or feelings of chest pressure.  You have sweating (diaphoresis) when at rest.  You have irregular heartbeats (palpitations).  You have rapid, racing heart beats.  You have unexplained fainting episodes.  You feel sick to your stomach (nausea) or vomiting without cause.  You have unexplained weakness. If you develop any of the symptoms which originally made you seek care, call for local emergency medical help. Do not drive yourself to the hospital.   This information is not intended to replace advice given to you by your health care provider. Make sure you discuss any questions you have with  your health care provider.   Document Released: 06/06/2001 Document Revised: 04/27/2015 Document Reviewed: 06/23/2015 Elsevier Interactive Patient Education [employer].

## 2015-12-18 NOTE — Discharge Summary (Signed)
D/C No. [id number]

## 2015-12-21 ENCOUNTER — Other Ambulatory Visit: Payer: Self-pay | Admitting: *Deleted

## 2015-12-21 ENCOUNTER — Telehealth: Payer: Self-pay

## 2015-12-21 NOTE — Patient Outreach (Addendum)
[facility]) Care Management  12/21/2015  [name] [name] 02-17-1951 [id number]    Assessment: Transition of care -week 1 (first attempt)  64 year old patient with recent admission to hospital (12/14- 12/24) with Atypical chest pain- MI ruled out, pneumonia, heart failure and COPD. Per hospital liaison, patient adamantly refused therapy recommendation for skilled nursing facility placement. Patient was discharged home with home health order. According to patient, [facility] was contacted [facility] today. Transition of care call completed. Patient reports "doing pretty well".  Patient  has a good understanding of medications and hospital stay. He stated concern about his appetite and stamina which he understands will gradually improve as he recovers per patient.  Patient mentioned  that he was provided with weighing scale and nebulizer machine at discharge from the hospital. He agreed to monitor weight daily and record. [name] [name] reports managing his medications and has all his medication supplies as ordered. He reports having few more doses of antibiotics (Levaquin) left to complete as instructed.  He has a scheduled follow up appointment with primary care provider (Dr. Jarold Song) on Thursday 12/29 at 10:30 am. Patient is aware to call Dr. Zenia Resides office (cardiologist) to set-up a follow-up appointment with him per discharge instruction.  Patient has a pulmonary follow-up appointment scheduled on 01/11/16.  Transportation services provided per patient's report. He is unable to recall company who is providing transport, however, he reports being told that he will be picked up on Thursday 12/29 at 9:30 am for his 10:30 post hospital follow-up appointment. Care management coordinator reinforced [employer] benefit for transport if needed.     Patient denies any other needs or issues at this time. He agreed to initial home visit next week. Encouraged patient to call [facility], care management  coordinator and 24-hour nurse line as needed. Contact informations with patient.    Plan: Will follow-up on appointment schedule with cardiologist per discharge instruction.  Will confirm transportation services for follow-up appointments. Initial home visit on 12/30/15.  Oreste Majeed A. Berdell Nevitt, BSN, RN-BC [place] Management Coordinator Cell: [phone]

## 2015-12-21 NOTE — Telephone Encounter (Signed)
[facility] Post-discharge Follow-Up Phone Call:  Date of Discharge: 12/18/15 Principal Discharge Diagnosis(es): atypical chest pain, Mi ruled out, s/p cardiac cath, recurrent atrial fibrillation, COPD, Acute pleural pericarditis, mild CAD Post-discharge Communication: call placed to the patient Call Completed: Yes       With Whom: Patient Interpreter Needed: No     Please check all that apply:  X Patient is knowledgeable of his/her condition(s) and/or treatment. X Patient is caring for self at home.   He lives alone in a single floor apartment. No steps to enter if her enters through the back door.  He stated that he has enough food and has been preparing his own meals. He also noted that he has a friend who has checked on him.  ? Patient is receiving assist at home from family and/or caregiver. Family and/or caregiver is knowledgeable of patient's condition(s) and/or treatment. X Patient is receiving home health services. If so, name of agency. [facility]. He is expecting a visit from the nurse today.      Medication Reconciliation:  X Medication list reviewed with patient.  Medication list reviewed in detail with the patient.  X Patient obtained all discharge medications.  - Yes he has all of his medications and stated that he is taking them as ordered.    Activities of Daily Living:  X Independent. He said that a walker was ordered and he has it at home but has not needed it. He also said that a bedside commode was ordered but he was not sure why he needed it.  This CM explained the use of the bedside commode.  ? Needs assist (describe; ? home DME used) ? [facility] (describe, ? home DME used)   Community resources in place for patient:  ? None  X Home Health/Home DME  [place] [facility]. He said that he received a call from the nurse and she is supposed to see  him today.  He noted that he is interested in the exercises to increase his stamina.   ? Assisted Living ? Support Group          Patient Education: Discussed his medication regime at length. Answered his questions about his DME and explained the reason why he was given a scale. He said that he has not used the scale yet; but will open the package and start using it. He noted that he was told to keep a log of his weights and this CM re-enforced the importance of daily weights.  He also noted that he was given a nebulizer but no medications for the nebulizer were ordered and they are not on the discharge medication list. He note that he will discuss the need for medication for the nebulizer when he sees Dr Jarold Song on 12/23/15.          Questions/Concerns discussed: He reported no other questions/concerns.  He said that he is not sure if he will have transportation to his appointment on 12/23/15 @ 1030.  CM to call him tomorrow to confirm the appointment and plan for transportation.

## 2015-12-22 ENCOUNTER — Telehealth: Payer: Self-pay

## 2015-12-22 DIAGNOSIS — I480 Paroxysmal atrial fibrillation: Secondary | ICD-10-CM | POA: Diagnosis not present

## 2015-12-22 DIAGNOSIS — I5032 Chronic diastolic (congestive) heart failure: Secondary | ICD-10-CM | POA: Diagnosis not present

## 2015-12-22 DIAGNOSIS — I2510 Atherosclerotic heart disease of native coronary artery without angina pectoris: Secondary | ICD-10-CM | POA: Diagnosis not present

## 2015-12-22 DIAGNOSIS — M1990 Unspecified osteoarthritis, unspecified site: Secondary | ICD-10-CM | POA: Diagnosis not present

## 2015-12-22 DIAGNOSIS — I482 Chronic atrial fibrillation: Secondary | ICD-10-CM | POA: Diagnosis not present

## 2015-12-22 DIAGNOSIS — I10 Essential (primary) hypertension: Secondary | ICD-10-CM | POA: Diagnosis not present

## 2015-12-22 DIAGNOSIS — J449 Chronic obstructive pulmonary disease, unspecified: Secondary | ICD-10-CM | POA: Diagnosis not present

## 2015-12-22 DIAGNOSIS — K219 Gastro-esophageal reflux disease without esophagitis: Secondary | ICD-10-CM | POA: Diagnosis not present

## 2015-12-22 DIAGNOSIS — J45909 Unspecified asthma, uncomplicated: Secondary | ICD-10-CM | POA: Diagnosis not present

## 2015-12-22 DIAGNOSIS — J440 Chronic obstructive pulmonary disease with acute lower respiratory infection: Secondary | ICD-10-CM | POA: Diagnosis not present

## 2015-12-22 DIAGNOSIS — I110 Hypertensive heart disease with heart failure: Secondary | ICD-10-CM | POA: Diagnosis not present

## 2015-12-22 DIAGNOSIS — D649 Anemia, unspecified: Secondary | ICD-10-CM | POA: Diagnosis not present

## 2015-12-22 DIAGNOSIS — B3323 Viral pericarditis: Secondary | ICD-10-CM | POA: Diagnosis not present

## 2015-12-22 DIAGNOSIS — J181 Lobar pneumonia, unspecified organism: Secondary | ICD-10-CM | POA: Diagnosis not present

## 2015-12-22 NOTE — Telephone Encounter (Signed)
Call placed to the patient to remind him of his appointment tomorrow - 12/23/15 @ 1030.  He said that he is aware of the transportation that will be provided. This CM confirmed with Rolanda Lundborg , [facility] scheduler that [employer] will pick the patient up at 0930.   Instructed him to bring his medications with him to his appointment tomorrow and he stated that he would.   He noted that he has not heard from [facility])  yet. He said that they were supposed to see him yesterday and never came. He then stated that he does not have the phone # for [facility] to call them to check on the status of the home visit. He requested that this CM call him back and leave the # for [facility] on his voice mail as he is not able to get up right now to get a pen/paper.  No other questions/concerns reported.  This CM then called back # [phone] at the patient's request and left the phone # for [facility] # [phone].   Call then placed to Dannielle Huh, RN, [place] Coordinator # [phone] informing her that [facility] will be providing transportation for the patient to his appointment tomorrow.

## 2015-12-23 ENCOUNTER — Ambulatory Visit: Payer: Commercial Managed Care - HMO | Attending: Family Medicine | Admitting: Family Medicine

## 2015-12-23 ENCOUNTER — Encounter: Payer: Self-pay | Admitting: Family Medicine

## 2015-12-23 VITALS — BP 123/87 | HR 73 | Temp 98.4°F | Resp 13 | Ht 67.5 in | Wt 244.0 lb

## 2015-12-23 DIAGNOSIS — I4891 Unspecified atrial fibrillation: Secondary | ICD-10-CM | POA: Insufficient documentation

## 2015-12-23 DIAGNOSIS — I10 Essential (primary) hypertension: Secondary | ICD-10-CM | POA: Insufficient documentation

## 2015-12-23 DIAGNOSIS — E785 Hyperlipidemia, unspecified: Secondary | ICD-10-CM | POA: Insufficient documentation

## 2015-12-23 DIAGNOSIS — Z452 Encounter for adjustment and management of vascular access device: Secondary | ICD-10-CM | POA: Insufficient documentation

## 2015-12-23 DIAGNOSIS — K219 Gastro-esophageal reflux disease without esophagitis: Secondary | ICD-10-CM | POA: Diagnosis not present

## 2015-12-23 DIAGNOSIS — Y95 Nosocomial condition: Secondary | ICD-10-CM | POA: Insufficient documentation

## 2015-12-23 DIAGNOSIS — I259 Chronic ischemic heart disease, unspecified: Secondary | ICD-10-CM | POA: Diagnosis not present

## 2015-12-23 DIAGNOSIS — J45909 Unspecified asthma, uncomplicated: Secondary | ICD-10-CM | POA: Diagnosis not present

## 2015-12-23 DIAGNOSIS — Z7982 Long term (current) use of aspirin: Secondary | ICD-10-CM | POA: Diagnosis not present

## 2015-12-23 DIAGNOSIS — I5032 Chronic diastolic (congestive) heart failure: Secondary | ICD-10-CM | POA: Diagnosis not present

## 2015-12-23 DIAGNOSIS — Z79899 Other long term (current) drug therapy: Secondary | ICD-10-CM | POA: Insufficient documentation

## 2015-12-23 DIAGNOSIS — R5383 Other fatigue: Secondary | ICD-10-CM | POA: Insufficient documentation

## 2015-12-23 DIAGNOSIS — J449 Chronic obstructive pulmonary disease, unspecified: Secondary | ICD-10-CM | POA: Insufficient documentation

## 2015-12-23 DIAGNOSIS — J438 Other emphysema: Secondary | ICD-10-CM | POA: Diagnosis not present

## 2015-12-23 DIAGNOSIS — I509 Heart failure, unspecified: Secondary | ICD-10-CM | POA: Diagnosis not present

## 2015-12-23 DIAGNOSIS — M7989 Other specified soft tissue disorders: Secondary | ICD-10-CM | POA: Insufficient documentation

## 2015-12-23 DIAGNOSIS — J189 Pneumonia, unspecified organism: Secondary | ICD-10-CM | POA: Diagnosis not present

## 2015-12-23 DIAGNOSIS — I480 Paroxysmal atrial fibrillation: Secondary | ICD-10-CM | POA: Insufficient documentation

## 2015-12-23 DIAGNOSIS — I2510 Atherosclerotic heart disease of native coronary artery without angina pectoris: Secondary | ICD-10-CM | POA: Insufficient documentation

## 2015-12-23 DIAGNOSIS — J90 Pleural effusion, not elsewhere classified: Secondary | ICD-10-CM | POA: Diagnosis not present

## 2015-12-23 MED ORDER — ALBUTEROL SULFATE (2.5 MG/3ML) 0.083% IN NEBU
2.5000 mg | INHALATION_SOLUTION | Freq: Four times a day (QID) | RESPIRATORY_TRACT | Status: DC | PRN
Start: 2015-12-23 — End: 2017-08-17

## 2015-12-23 MED ORDER — VITAMIN D (ERGOCALCIFEROL) 1.25 MG (50000 UNIT) PO CAPS: 50000.0000 [IU] | ORAL_CAPSULE | ORAL | Status: DC

## 2015-12-23 NOTE — Progress Notes (Signed)
[place]  Date of telephone encounter: 12/21/15  Admit date: 12/08/15 Discharge date: 12/18/15  PCP: previously Dr Annitta Needs   HPI: [name] [name] is a 64 y.o. male with a history of paroxysmal atrial fibrillation, diastolic congestive heart failure, COPD, gastroesophageal reflux disease who had presented to the ED with pleuritic chest pain and EKG in the ED revealed new ST elevation in anterior lateral leads and inferior leads and code STEMI was called. Patient was taken to the cardiac cath lab on 12/08/15 and findings revealed 30% stenosis of ost RCA to proximal RCA, 20% stenosis of mid LAD lesion, 40% stenosis of distal LAD lesion, normal left ventricular systolic function. Post cardiac cath he developed recurrent episodes of A. fib with RVR, was placed on IV amiodarone, he also had episode of atrial flutter ; the patient refused DC cardioversion. Chest x-ray revealed cardiomegaly and bilateral pleural effusions, chest CT revealed small to moderate bilateral pleural effusion, moderate pericardial effusion, associated bilateral lower lobe patchy opacities likely compressive atelectasis 2-D echo revealed EF of [id number], grade 1 diastolic dysfunction. He refused thoracocentesis and opted for medical management and so he received steroids and colchicine. He was also on IV vancomycin and Zosyn for HCAP. He was placed on Xarelto which was later discontinued to prevent development of hemorrhagic pericarditis given the presence of pleural effusion and pericarditis. His symptoms improved and his labs on discharge revealed WBC of 13.0 (down from 26.8 on admission), hemoglobin of 11.8 (up from 10.9 on admission)  Prior to this presentation he was hospitalized between 11/27/15 and 12/02/15 for sepsis due to pneumonia for which she was discharged on Levaquin.  Today he complains of being fatigued which he describes as having "low stamina"; he is currently taking his Levaquin and denies any  fever. He does have some dyspnea on moderate exertion and some pedal edema. He just started weighing himself today.  No Known Allergies Past Medical History  Diagnosis Date  . Hypertension   . Asthma   . Atrial fibrillation ([place])   . CHF (congestive heart failure) ([place])   . Anemia   . Heart disease   . COPD (chronic obstructive pulmonary disease) ([place])    Current Outpatient Prescriptions on File Prior to Visit  Medication Sig Dispense Refill  . albuterol (PROVENTIL HFA;VENTOLIN HFA) 108 (90 BASE) MCG/ACT inhaler Inhale 2 puffs into the lungs every 6 (six) hours as needed. 1 Inhaler 5  . amiodarone (PACERONE) 200 MG tablet Take 1 tablet (200 mg total) by mouth 2 (two) times daily. 60 tablet 2  . aspirin EC 81 MG tablet Take 81 mg by mouth daily.      Marland Kitchen atorvastatin (LIPITOR) 40 MG tablet Take 1 tablet (40 mg total) by mouth daily at 6 PM. 30 tablet 3  . clotrimazole-betamethasone (LOTRISONE) cream Apply 1 application topically 2 (two) times daily. (Patient taking differently: Apply 1 application topically 2 (two) times daily as needed (FOR RASH). ) 30 g 1  . colchicine 0.6 MG tablet Take 1 tablet (0.6 mg total) by mouth 2 (two) times daily. 60 tablet 3  . digoxin (LANOXIN) 0.125 MG tablet Take 1 tablet (0.125 mg total) by mouth daily. 30 tablet 3  . ferrous sulfate 325 (65 FE) MG tablet Take 325 mg by mouth daily with breakfast.    . fluticasone-salmeterol (ADVAIR HFA) 115-21 MCG/ACT inhaler Inhale 2 puffs into the lungs daily as needed. For shortness of breath 1 Inhaler 3  . levETIRAcetam (KEPPRA) 750 MG tablet Take  2 tablets (1,500 mg total) by mouth 2 (two) times daily. 120 tablet 11  . levofloxacin (LEVAQUIN) 500 MG tablet Take 1 tablet (500 mg total) by mouth daily. 7 tablet 0  . lisinopril (PRINIVIL,ZESTRIL) 20 MG tablet Take 20 mg by mouth daily.     . metoprolol succinate (TOPROL XL) 25 MG 24 hr tablet Take 1 tablet (25 mg total) by mouth daily. 30 tablet 3  . nitroGLYCERIN  (NITROSTAT) 0.4 MG SL tablet Place 1 tablet (0.4 mg total) under the tongue every 5 (five) minutes as needed for chest pain. 25 tablet 1  . omeprazole (PRILOSEC) 40 MG capsule Take 1 capsule (40 mg total) by mouth 2 (two) times daily before a meal. 60 capsule 3  . predniSONE (DELTASONE) 20 MG tablet Take 1 tablet (20 mg total) by mouth daily with breakfast. 3 tablet 0   No current facility-administered medications on file prior to visit.   Family History  Problem Relation Age of Onset  . Cancer Mother    Social History   Social History  . Marital Status: Single    Spouse Name: N/A  . Number of Children: 1  . Years of Education: college   Occupational History  . [employer]    Social History Main Topics  . Smoking status: Never Smoker   . Smokeless tobacco: Never Used  . Alcohol Use: No  . Drug Use: No  . Sexual Activity: Not Currently   Other Topics Concern  . Not on file   Social History Narrative   Patient lives at home alone and he is single.   Patient is semi retired.    Education college.   Right handed.   Caffeine two cokes daily.    Review of Systems: Constitutional: Negative for fever, chills, diaphoresis, activity change, appetite change and positive for fatigue. HENT: Negative for ear pain, nosebleeds, congestion, facial swelling, rhinorrhea, neck pain, neck stiffness and ear discharge.  Eyes: Negative for pain, discharge, redness, itching and visual disturbance. Respiratory: Negative for cough, choking, chest tightness, positive for dyspnea on moderate exertion Cardiovascular: Negative for chest pain, palpitations and leg swelling. Gastrointestinal: Negative for abdominal distention. Genitourinary: Negative for dysuria, urgency, frequency, hematuria, flank pain, decreased urine volume, difficulty urinating and dyspareunia.  Musculoskeletal: Negative for back pain, joint swelling, arthralgias and gait problem. Neurological: Negative for dizziness, tremors,  seizures, syncope, facial asymmetry, speech difficulty, weakness, light-headedness, numbness and headaches.  Hematological: Negative for adenopathy. Does not bruise/bleed easily. Psychiatric/Behavioral: Negative for hallucinations, behavioral problems, confusion, dysphoric mood, decreased concentration and agitation.    Objective:   Filed Vitals:   12/23/15 1014  BP: 123/87  Pulse: 73  Temp: 98.4 F (36.9 C)  Resp: 13    Physical Exam: Constitutional: Patient appears well-developed and well-nourished. No distress. HENT: Normocephalic, atraumatic, External right and left ear normal. Oropharynx is clear and moist.  Eyes: Conjunctivae and EOM are normal. PERRLA, no scleral icterus. Neck: Normal ROM. Neck supple. No JVD. No tracheal deviation. No thyromegaly. CVS: RRR, S1/S2 +, no murmurs, no gallops, no carotid bruit.  Pulmonary: Effort and breath sounds normal, no stridor, rhonchi, wheezes, rales.  Abdominal: Soft. BS +,  no distension, tenderness, rebound or guarding.  Musculoskeletal: Normal range of motion. No edema and no tenderness.  Lymphadenopathy: No lymphadenopathy noted, cervical, inguinal or axillary Neuro: Alert. Normal reflexes, muscle tone coordination. No cranial nerve deficit. Skin: Skin is warm and dry. No rash noted. Not diaphoretic. No erythema. No pallor. Psychiatric: Normal mood and  affect. Behavior, judgment, thought content normal.  Lab Results  Component Value Date   WBC 13.0* 12/18/2015   HGB 11.8* 12/18/2015   HCT 38.1* 12/18/2015   MCV 67.6* 12/18/2015   PLT 511* 12/18/2015   Lab Results  Component Value Date   CREATININE 1.10 12/18/2015   BUN 17 12/18/2015   NA 141 12/18/2015   K 3.9 12/18/2015   CL 110 12/18/2015   CO2 21* 12/18/2015    Lab Results  Component Value Date   HGBA1C 5.8* 12/08/2015   Lipid Panel     Component Value Date/Time   CHOL 144 12/09/2015 0133   TRIG 53 12/09/2015 0133   HDL 57 12/09/2015 0133   CHOLHDL 2.5  12/09/2015 0133   VLDL 11 12/09/2015 0133   LDLCALC 76 12/09/2015 0133       Assessment and plan:  Atrial fibrillation: Currently on rate control with digoxin and metoprolol and on rhythm control with amiodarone. Not on anticoagulation due to risk of hemorrhagic pericarditis in view of pleural effusion and pericarditis.  Diastolic CHF: Stable, no acute exacerbation. Advised to keep appointment with cardiology.  Fatigue: I have explained to him that this could be secondary to deconditioning. He has been advised to perform activities as tolerated  Healthcare associated pneumonia: Currently on Levaquin.  COPD: Doing well on Advair and albuterol.  Hypertension:  Controlled  Hyperlipidemia: Controlled on atorvastatin.      Arnoldo Morale, [facility] [phone] 12/23/2015, 10:48 AM

## 2015-12-23 NOTE — Progress Notes (Signed)
Patient present for follow up after recent 10 day hospital admission Reports no pain by lack of energy He is taking all medications as prescribed He began daily weights this am He reports being given a nebulizer machine in the hospital but was prescribed no medications to go with it He is following up with Dr. Terrence Dupont

## 2015-12-24 DIAGNOSIS — J449 Chronic obstructive pulmonary disease, unspecified: Secondary | ICD-10-CM | POA: Insufficient documentation

## 2015-12-25 DIAGNOSIS — I2510 Atherosclerotic heart disease of native coronary artery without angina pectoris: Secondary | ICD-10-CM | POA: Diagnosis not present

## 2015-12-25 DIAGNOSIS — I5032 Chronic diastolic (congestive) heart failure: Secondary | ICD-10-CM | POA: Diagnosis not present

## 2015-12-25 DIAGNOSIS — D649 Anemia, unspecified: Secondary | ICD-10-CM | POA: Diagnosis not present

## 2015-12-25 DIAGNOSIS — J440 Chronic obstructive pulmonary disease with acute lower respiratory infection: Secondary | ICD-10-CM | POA: Diagnosis not present

## 2015-12-25 DIAGNOSIS — K219 Gastro-esophageal reflux disease without esophagitis: Secondary | ICD-10-CM | POA: Diagnosis not present

## 2015-12-25 DIAGNOSIS — J181 Lobar pneumonia, unspecified organism: Secondary | ICD-10-CM | POA: Diagnosis not present

## 2015-12-25 DIAGNOSIS — I110 Hypertensive heart disease with heart failure: Secondary | ICD-10-CM | POA: Diagnosis not present

## 2015-12-25 DIAGNOSIS — I482 Chronic atrial fibrillation: Secondary | ICD-10-CM | POA: Diagnosis not present

## 2015-12-25 DIAGNOSIS — M1990 Unspecified osteoarthritis, unspecified site: Secondary | ICD-10-CM | POA: Diagnosis not present

## 2015-12-28 DIAGNOSIS — M1990 Unspecified osteoarthritis, unspecified site: Secondary | ICD-10-CM | POA: Diagnosis not present

## 2015-12-28 DIAGNOSIS — K219 Gastro-esophageal reflux disease without esophagitis: Secondary | ICD-10-CM | POA: Diagnosis not present

## 2015-12-28 DIAGNOSIS — I482 Chronic atrial fibrillation: Secondary | ICD-10-CM | POA: Diagnosis not present

## 2015-12-28 DIAGNOSIS — I2510 Atherosclerotic heart disease of native coronary artery without angina pectoris: Secondary | ICD-10-CM | POA: Diagnosis not present

## 2015-12-28 DIAGNOSIS — D649 Anemia, unspecified: Secondary | ICD-10-CM | POA: Diagnosis not present

## 2015-12-28 DIAGNOSIS — J440 Chronic obstructive pulmonary disease with acute lower respiratory infection: Secondary | ICD-10-CM | POA: Diagnosis not present

## 2015-12-28 DIAGNOSIS — J181 Lobar pneumonia, unspecified organism: Secondary | ICD-10-CM | POA: Diagnosis not present

## 2015-12-28 DIAGNOSIS — I110 Hypertensive heart disease with heart failure: Secondary | ICD-10-CM | POA: Diagnosis not present

## 2015-12-28 DIAGNOSIS — I5032 Chronic diastolic (congestive) heart failure: Secondary | ICD-10-CM | POA: Diagnosis not present

## 2015-12-29 DIAGNOSIS — I482 Chronic atrial fibrillation: Secondary | ICD-10-CM | POA: Diagnosis not present

## 2015-12-29 DIAGNOSIS — J440 Chronic obstructive pulmonary disease with acute lower respiratory infection: Secondary | ICD-10-CM | POA: Diagnosis not present

## 2015-12-29 DIAGNOSIS — K219 Gastro-esophageal reflux disease without esophagitis: Secondary | ICD-10-CM | POA: Diagnosis not present

## 2015-12-29 DIAGNOSIS — D649 Anemia, unspecified: Secondary | ICD-10-CM | POA: Diagnosis not present

## 2015-12-29 DIAGNOSIS — M1990 Unspecified osteoarthritis, unspecified site: Secondary | ICD-10-CM | POA: Diagnosis not present

## 2015-12-29 DIAGNOSIS — J181 Lobar pneumonia, unspecified organism: Secondary | ICD-10-CM | POA: Diagnosis not present

## 2015-12-29 DIAGNOSIS — I110 Hypertensive heart disease with heart failure: Secondary | ICD-10-CM | POA: Diagnosis not present

## 2015-12-29 DIAGNOSIS — I5032 Chronic diastolic (congestive) heart failure: Secondary | ICD-10-CM | POA: Diagnosis not present

## 2015-12-29 DIAGNOSIS — I2510 Atherosclerotic heart disease of native coronary artery without angina pectoris: Secondary | ICD-10-CM | POA: Diagnosis not present

## 2015-12-30 ENCOUNTER — Ambulatory Visit: Payer: Self-pay | Admitting: *Deleted

## 2015-12-31 DIAGNOSIS — D649 Anemia, unspecified: Secondary | ICD-10-CM | POA: Diagnosis not present

## 2015-12-31 DIAGNOSIS — I110 Hypertensive heart disease with heart failure: Secondary | ICD-10-CM | POA: Diagnosis not present

## 2015-12-31 DIAGNOSIS — I2510 Atherosclerotic heart disease of native coronary artery without angina pectoris: Secondary | ICD-10-CM | POA: Diagnosis not present

## 2015-12-31 DIAGNOSIS — I5032 Chronic diastolic (congestive) heart failure: Secondary | ICD-10-CM | POA: Diagnosis not present

## 2015-12-31 DIAGNOSIS — J181 Lobar pneumonia, unspecified organism: Secondary | ICD-10-CM | POA: Diagnosis not present

## 2015-12-31 DIAGNOSIS — M1990 Unspecified osteoarthritis, unspecified site: Secondary | ICD-10-CM | POA: Diagnosis not present

## 2015-12-31 DIAGNOSIS — K219 Gastro-esophageal reflux disease without esophagitis: Secondary | ICD-10-CM | POA: Diagnosis not present

## 2015-12-31 DIAGNOSIS — J440 Chronic obstructive pulmonary disease with acute lower respiratory infection: Secondary | ICD-10-CM | POA: Diagnosis not present

## 2015-12-31 DIAGNOSIS — I482 Chronic atrial fibrillation: Secondary | ICD-10-CM | POA: Diagnosis not present

## 2016-01-03 ENCOUNTER — Other Ambulatory Visit: Payer: Self-pay | Admitting: *Deleted

## 2016-01-03 DIAGNOSIS — K219 Gastro-esophageal reflux disease without esophagitis: Secondary | ICD-10-CM | POA: Diagnosis not present

## 2016-01-03 DIAGNOSIS — I5032 Chronic diastolic (congestive) heart failure: Secondary | ICD-10-CM | POA: Diagnosis not present

## 2016-01-03 DIAGNOSIS — I482 Chronic atrial fibrillation: Secondary | ICD-10-CM | POA: Diagnosis not present

## 2016-01-03 DIAGNOSIS — D649 Anemia, unspecified: Secondary | ICD-10-CM | POA: Diagnosis not present

## 2016-01-03 DIAGNOSIS — I2510 Atherosclerotic heart disease of native coronary artery without angina pectoris: Secondary | ICD-10-CM | POA: Diagnosis not present

## 2016-01-03 DIAGNOSIS — M1990 Unspecified osteoarthritis, unspecified site: Secondary | ICD-10-CM | POA: Diagnosis not present

## 2016-01-03 DIAGNOSIS — I110 Hypertensive heart disease with heart failure: Secondary | ICD-10-CM | POA: Diagnosis not present

## 2016-01-03 DIAGNOSIS — J440 Chronic obstructive pulmonary disease with acute lower respiratory infection: Secondary | ICD-10-CM | POA: Diagnosis not present

## 2016-01-03 DIAGNOSIS — J181 Lobar pneumonia, unspecified organism: Secondary | ICD-10-CM | POA: Diagnosis not present

## 2016-01-03 NOTE — Patient Outreach (Addendum)
[facility]) Care Management  01/03/2016  [name] [name] 1951/10/20 [id number]   Assessment: Transition of care call/ care coordination Call placed to reschedule appointment made for 12/30/15. Patient agreed to initial home visit on 01/06/16.  Patient reports he had his post hospital follow-up visit with primary care provider (Dr. Charlane Ferretti) on 12/23/15. He reports having upcoming appointments as follows:  Dr. Charlane Ferretti (PCP) follow-up visit 01/06/16- rescheduled to 01/10/16 Dr. Terrence Dupont (Cardio) 1/11 rescheduled to 01/10/16 due to [place] follow-up 01/10/16 Pulmonary follow-up 1/17 Patient reports that he is aware to call [employer] few days in advance to schedule transportation to his appointments. He verbalized having all medication supplies needed and taking them as instructed. Patient states he can not think of any needs or concerns at this time but will call [facility], care management coordinator or 24-hour nurse line if needed.    Plan: Initial home visit on 01/06/16.  Caitlyne Ingham A. Mahagony Grieb, BSN, RN-BC [place] Management Coordinator Cell: [phone]

## 2016-01-04 DIAGNOSIS — I482 Chronic atrial fibrillation: Secondary | ICD-10-CM | POA: Diagnosis not present

## 2016-01-04 DIAGNOSIS — I110 Hypertensive heart disease with heart failure: Secondary | ICD-10-CM | POA: Diagnosis not present

## 2016-01-04 DIAGNOSIS — I5032 Chronic diastolic (congestive) heart failure: Secondary | ICD-10-CM | POA: Diagnosis not present

## 2016-01-04 DIAGNOSIS — J181 Lobar pneumonia, unspecified organism: Secondary | ICD-10-CM | POA: Diagnosis not present

## 2016-01-04 DIAGNOSIS — M1990 Unspecified osteoarthritis, unspecified site: Secondary | ICD-10-CM | POA: Diagnosis not present

## 2016-01-04 DIAGNOSIS — K219 Gastro-esophageal reflux disease without esophagitis: Secondary | ICD-10-CM | POA: Diagnosis not present

## 2016-01-04 DIAGNOSIS — D649 Anemia, unspecified: Secondary | ICD-10-CM | POA: Diagnosis not present

## 2016-01-04 DIAGNOSIS — J440 Chronic obstructive pulmonary disease with acute lower respiratory infection: Secondary | ICD-10-CM | POA: Diagnosis not present

## 2016-01-04 DIAGNOSIS — I2510 Atherosclerotic heart disease of native coronary artery without angina pectoris: Secondary | ICD-10-CM | POA: Diagnosis not present

## 2016-01-05 DIAGNOSIS — I5032 Chronic diastolic (congestive) heart failure: Secondary | ICD-10-CM | POA: Diagnosis not present

## 2016-01-05 DIAGNOSIS — D649 Anemia, unspecified: Secondary | ICD-10-CM | POA: Diagnosis not present

## 2016-01-05 DIAGNOSIS — J440 Chronic obstructive pulmonary disease with acute lower respiratory infection: Secondary | ICD-10-CM | POA: Diagnosis not present

## 2016-01-05 DIAGNOSIS — I110 Hypertensive heart disease with heart failure: Secondary | ICD-10-CM | POA: Diagnosis not present

## 2016-01-05 DIAGNOSIS — J181 Lobar pneumonia, unspecified organism: Secondary | ICD-10-CM | POA: Diagnosis not present

## 2016-01-05 DIAGNOSIS — M1990 Unspecified osteoarthritis, unspecified site: Secondary | ICD-10-CM | POA: Diagnosis not present

## 2016-01-05 DIAGNOSIS — I2510 Atherosclerotic heart disease of native coronary artery without angina pectoris: Secondary | ICD-10-CM | POA: Diagnosis not present

## 2016-01-05 DIAGNOSIS — K219 Gastro-esophageal reflux disease without esophagitis: Secondary | ICD-10-CM | POA: Diagnosis not present

## 2016-01-05 DIAGNOSIS — I482 Chronic atrial fibrillation: Secondary | ICD-10-CM | POA: Diagnosis not present

## 2016-01-06 ENCOUNTER — Encounter: Payer: Self-pay | Admitting: *Deleted

## 2016-01-06 ENCOUNTER — Other Ambulatory Visit: Payer: Self-pay | Admitting: *Deleted

## 2016-01-06 ENCOUNTER — Ambulatory Visit: Payer: Self-pay | Admitting: Family Medicine

## 2016-01-06 DIAGNOSIS — J181 Lobar pneumonia, unspecified organism: Secondary | ICD-10-CM | POA: Diagnosis not present

## 2016-01-06 DIAGNOSIS — I482 Chronic atrial fibrillation: Secondary | ICD-10-CM | POA: Diagnosis not present

## 2016-01-06 DIAGNOSIS — I5032 Chronic diastolic (congestive) heart failure: Secondary | ICD-10-CM | POA: Diagnosis not present

## 2016-01-06 DIAGNOSIS — D649 Anemia, unspecified: Secondary | ICD-10-CM | POA: Diagnosis not present

## 2016-01-06 DIAGNOSIS — I2510 Atherosclerotic heart disease of native coronary artery without angina pectoris: Secondary | ICD-10-CM | POA: Diagnosis not present

## 2016-01-06 DIAGNOSIS — J440 Chronic obstructive pulmonary disease with acute lower respiratory infection: Secondary | ICD-10-CM | POA: Diagnosis not present

## 2016-01-06 DIAGNOSIS — I110 Hypertensive heart disease with heart failure: Secondary | ICD-10-CM | POA: Diagnosis not present

## 2016-01-06 DIAGNOSIS — M1990 Unspecified osteoarthritis, unspecified site: Secondary | ICD-10-CM | POA: Diagnosis not present

## 2016-01-06 DIAGNOSIS — K219 Gastro-esophageal reflux disease without esophagitis: Secondary | ICD-10-CM | POA: Diagnosis not present

## 2016-01-06 MED FILL — levETIRAcetam 750 MG TABS: 750 | 30 days supply | Qty: 120 | Fill #1

## 2016-01-06 NOTE — Patient Outreach (Signed)
[facility]) Care Management   01/06/2016  OLUWADAMILOLA DOMBROWSKI [birth year] [id number]  MARSEL BAZEN is an 65 y.o. male  Subjective: Patient states "feeling well over-all". He denies cough, sputum production and chest pain/ discomfort. Patient reports walking outside to get his mail since he has not been out since the snow. Reports getting easily fatigued with physical exertion.   Objective: BP 120/68 mmHg  Pulse 81  Resp 20  SpO2 98%  Review of Systems  Constitutional: Negative.   HENT: Negative.   Eyes: Negative.        Wears eyeglasses with driving  Respiratory: Positive for shortness of breath. Negative for cough, sputum production and wheezing.        Has shortness of breath with physical exertion- relieved with rest Lung sounds slightly diminished to left side otherwise clear Has CPAP but reports not using it.  Cardiovascular: Positive for leg swelling. Negative for chest pain.       Non-pitting edema to bilateral legs and ankles Heart with regular rate and rhythm  Gastrointestinal: Positive for heartburn. Negative for diarrhea and constipation.       Abdomen obese, soft and non-tender Bowel sounds present  Genitourinary: Negative.   Musculoskeletal: Negative.  Negative for falls.  Skin:       Dry skin to bilateral feet/legs  Neurological: Negative.   Endo/Heme/Allergies: Negative.   Psychiatric/Behavioral: Negative.     Physical Exam  Current Medications:   Current Outpatient Prescriptions  Medication Sig Dispense Refill  . albuterol (PROVENTIL HFA;VENTOLIN HFA) 108 (90 BASE) MCG/ACT inhaler Inhale 2 puffs into the lungs every 6 (six) hours as needed. 1 Inhaler 5  . albuterol (PROVENTIL) (2.5 MG/3ML) 0.083% nebulizer solution Take 3 mLs (2.5 mg total) by nebulization every 6 (six) hours as needed for wheezing or shortness of breath. 150 mL 1  . amiodarone (PACERONE) 200 MG tablet Take 1 tablet (200 mg total) by mouth 2 (two) times daily. 60 tablet 2   . aspirin EC 81 MG tablet Take 81 mg by mouth daily.      Marland Kitchen atorvastatin (LIPITOR) 40 MG tablet Take 1 tablet (40 mg total) by mouth daily at 6 PM. 30 tablet 3  . clotrimazole-betamethasone (LOTRISONE) cream Apply 1 application topically 2 (two) times daily. (Patient taking differently: Apply 1 application topically 2 (two) times daily as needed (FOR RASH). ) 30 g 1  . colchicine 0.6 MG tablet Take 1 tablet (0.6 mg total) by mouth 2 (two) times daily. 60 tablet 3  . digoxin (LANOXIN) 0.125 MG tablet Take 1 tablet (0.125 mg total) by mouth daily. 30 tablet 3  . fluticasone-salmeterol (ADVAIR HFA) 115-21 MCG/ACT inhaler Inhale 2 puffs into the lungs daily as needed. For shortness of breath 1 Inhaler 3  . lisinopril (PRINIVIL,ZESTRIL) 20 MG tablet Take 20 mg by mouth daily.     . metoprolol succinate (TOPROL XL) 25 MG 24 hr tablet Take 1 tablet (25 mg total) by mouth daily. 30 tablet 3  . nitroGLYCERIN (NITROSTAT) 0.4 MG SL tablet Place 1 tablet (0.4 mg total) under the tongue every 5 (five) minutes as needed for chest pain. 25 tablet 1  . omeprazole (PRILOSEC) 40 MG capsule Take 1 capsule (40 mg total) by mouth 2 (two) times daily before a meal. 60 capsule 3  . Vitamin D, Ergocalciferol, (DRISDOL) 50000 units CAPS capsule Take 1 capsule (50,000 Units total) by mouth every Wednesday. 10 capsule 2  . ferrous sulfate 325 (65 FE) MG tablet  Take 325 mg by mouth daily with breakfast. Reported on 01/06/2016    . levETIRAcetam (KEPPRA) 750 MG tablet Take 2 tablets (1,500 mg total) by mouth 2 (two) times daily. (Patient not taking: Reported on 01/06/2016) 120 tablet 11  . levofloxacin (LEVAQUIN) 500 MG tablet Take 1 tablet (500 mg total) by mouth daily. (Patient not taking: Reported on 01/06/2016) 7 tablet 0  . predniSONE (DELTASONE) 20 MG tablet Take 1 tablet (20 mg total) by mouth daily with breakfast. (Patient not taking: Reported on 01/06/2016) 3 tablet 0   No current facility-administered medications for  this visit.    Functional Status:   In your present state of health, do you have any difficulty performing the following activities: 01/06/2016 12/08/2015  Hearing? N -  Vision? N -  Difficulty concentrating or making decisions? N -  Walking or climbing stairs? N -  Dressing or bathing? N -  Doing errands, shopping? N N  Preparing Food and eating ? N -  Using the Toilet? N -  In the past six months, have you accidently leaked urine? N -  Do you have problems with loss of bowel control? N -  Managing your Medications? N -  Managing your Finances? N -  Housekeeping or managing your Housekeeping? N -    Fall/Depression Screening:    PHQ 2/9 Scores 01/06/2016 12/23/2015 02/27/2014  PHQ - 2 Score 0 0 0    Assessment: 65 year old male being seen for congestive heart failure disease and symptom management. He had recent hospitalizations on 12/3- 12/8 for sepsis due to pneumonia and asthma exacerbation and on 12/14- 12/24 for atypical chest pain - MI ruled out, resolving left lower lobe pneumonia, atrial fibrillation and anemia.   Arrived at patient's house. It took a while for door to be opened since patient was out to get his mail. He reports that it was his first time to get out to his mailbox since the snow. Patient lives alone and independent with self care. He mentioned about his appetite and stamina which he understands will gradually improve as he recovers. Patient reports he had been provided with weighing scale and nebulizer machine at discharge from the hospital. He states weighing self daily with weight ranging from 241.5 - 245 pounds. Patient was provided [facility] calendar/notebook to record his daily weights. Encouraged patient to monitor for any weight gain of 3 pounds overnight or 5 pounds in a week and make his provider aware of any. [facility] heart failure packet was also provided and discussed the signs and symptoms of heart failure; heart failure zone tool; when to call the doctor for help  and ways to manage his heart failure.  EMMI educational material for Pneumonia was given to patient as well. Encouraged patient to read health informations provided to him and ask questions if any.  Patient manages his medications as stated. Review showed that he is out of Keppra and missing Iron. Care management coordinator encouraged patient to call his pharmacy ([facility]) during this visit to refill medication. He states will pick up refilled prescription and over-the counter Iron today or tomorrow. Patient reports having completed his antbiotics Levaquin and Prednisone. He mentioned using Albuterol twice this week and was effective.  Patient reports active with [facility] physical therapy, nurse and occupational therapy. He has a scheduled follow up appointment with primary care provider (Dr. Arnoldo Morale) today but was moved to 01/10/16 due to wintry weather. Patient has a follow-up appointment  with Dr. Terrence Dupont (Cardio) scheduled on 1/16, as well as [facility] follow-up on the same date. He states his appointments are on different times and prefers to do it in one day since the offices are close to each other. He verbalized that he had scheduled his transportation for those appointments with [facility]. Patient also has a pulmonary follow-up appointment scheduled on 01/11/16.  Patient denies of any other needs or concerns at this time. He agreed to transition of care call next week. Encouraged patient to call [facility], care management coordinator and 24-hour nurse line as needed. Contact informations provided to patient.    Plan: Transition of care call 01/13/16. Will follow-up regarding Keppra and Iron medications. Will get updates on follow-up visits with his providers   [facility] CM Care Plan Problem One        Most Recent Value   Care Plan Problem One  at risk for readmission   Role Documenting the Problem One  Care Management [place] for Problem One   Active   [facility] Long Term Goal (31-90 days)  patient will not be readmitted in the next 60 days   [facility] Long Term Goal Start Date  01/06/16   Interventions for Problem One Long Term Goal  encourage medication adherence, emphasize attending follow-up appointments with providers,  provide [facility] heart failure packet and discussed,  encourage to evaluate self daily using  heart failure zone tool and call the doctor to get help when having signs and symptoms under the yellow zone   [facility] CM Short Term Goal #1 (0-30 days)  patient will identify the 3 heart failure zones and enumerate at least 3 signs and symptoms under each zone and when to call the doctor for help in the next 30 days    [facility] CM Short Term Goal #1 Start Date  01/06/16   Interventions for Short Term Goal #1  provide [facility] heart failure packet,  discuss the 3 heart failure zones the signs and symptoms under each zone,  encourage patient to evaluate self daily using the zone tool,  emphasize to patient, that if experiencing one or more signs and symptoms under the yellow zone to call the doctor for help,  provide heart failure magnet and place on refrigerator door for easy access    [facility] CM Short Term Goal #2 (0-30 days)  Patient will verbalize the 5 steps of managing heart failure or living better with heart failure in the next 30 days    [facility] CM Short Term Goal #2 Start Date  01/06/16   Interventions for Short Term Goal #2  discuss the [facility] packet contents and encourage patient to read the pamphlet on Living better with Heart Failure,  instruct patient to continue monitoring weight daily and record- provided [facility] calendar/notebook,  advise to evaluate self daily using heart failure zone tool,   emphasize adherence to medications,  encourage to maintain low sodium diet and advise to get more active by exercising    [facility] CM Short Term Goal #3 (0-30 days)  patient will adhere to medications as ordered such as getting Keppra refilled and picking-up over-the counter  Iron medication within a week   [facility] CM Short Term Goal #3 Start Date  01/06/16   Interventions for Short [place]ern Goal #3  emphasize importance and benefits of adherence to medications,  encourage patient to call his pharmacy to confirm prescription of Keppra and ask to refill medication,  encourage to pick-up over-the counter Iron and  take as ordered,  educate regarding Iron in relation to easy fatigability     [facility] CM Care Plan Problem Two        Most Recent Value   Care Plan Problem Two  patient expresses easy fatigability and weak stamina   Role Documenting the Problem Two  Care Management [place] for Problem Two  Active   Interventions for Problem Two Long Term Goal   encourage patient to be more active and do exercises like walking,  remind patient to start slowly to build endurance then increase gradually as tolerated,  praise patient for efforts,  encourage patient to take rest periods to avoid shortness of breath and fatigue,  remind patient to eat healthy and nutritious food to help regain and boost energy   [facility] Long Term Goal (31-90) days  patient will report being able to tolerate walking at least 15 minutes daily as part of his exercise in the next 60 days   [facility] Long Term Goal Start Date  01/06/16   [facility] CM Short Term Goal #1 (0-30 days)  patient will report being able to walk to his mailbox with lesser rest periods in the next 30 days    [facility] CM Short Term Goal #1 Start Date  01/06/16   Interventions for Short Term Goal #2   remind patient to start slow and build endurance then gradually increase as tolerated,  encourage patient to do stretching and breathing exercises in the house before going out to walk,  encourage to eat healthy food that will boost up energy and stamina,  encourage to pick up over-the counter Iron medication to take as ordered        Edwena Felty A. Evangelia Whitaker, BSN, RN-BC [place] Management Coordinator Cell: [phone]

## 2016-01-07 ENCOUNTER — Telehealth: Payer: Self-pay

## 2016-01-07 ENCOUNTER — Encounter: Payer: Self-pay | Admitting: *Deleted

## 2016-01-07 NOTE — Telephone Encounter (Signed)
This Case Manager placed call to patient to check status and to remind him of upcoming Transitional Care follow-up appointment on 01/10/16 at 1530. Patient indicated he would be at appointment and had arranged [employer] transportation to his three appointments (Cardology, Orthopedic, and Transitional Care follow-up) on 01/10/16.   Inquired about patient's status. Patient indicated his appetite was not back to normal after hospitalization and he was "concerned about his stamina." Advised patient to perform activities as tolerated. Also reminded patient that Dr. Jarold Song explained that fatigue could be from deconditioning. Inquired if patient still receiving HH RN/PT/OT services. Patient indicated he was still receiving [place] RN/OT services. He indicated he was "released" from [facility] PT services; however, he is still doing the exercises as directed. Patient also active with [facility]. Encouraged patient to weigh himself daily, to keep record of his weights, and to bring weight log to his Transitional Care Clinic follow-up appointment on 01/10/16. Patient verbalized understanding. In addition, inquired if patient taking medications as directed. Patient indicated he had all of his medications and said he was taking them as directed. No additional needs/concerns identified.

## 2016-01-10 ENCOUNTER — Ambulatory Visit: Payer: Commercial Managed Care - HMO | Attending: Family Medicine | Admitting: Family Medicine

## 2016-01-10 ENCOUNTER — Encounter: Payer: Self-pay | Admitting: Family Medicine

## 2016-01-10 VITALS — BP 138/88 | HR 83 | Temp 98.1°F | Resp 13 | Ht 67.5 in | Wt 239.6 lb

## 2016-01-10 DIAGNOSIS — R6889 Other general symptoms and signs: Secondary | ICD-10-CM | POA: Diagnosis not present

## 2016-01-10 DIAGNOSIS — R630 Anorexia: Secondary | ICD-10-CM

## 2016-01-10 DIAGNOSIS — D649 Anemia, unspecified: Secondary | ICD-10-CM | POA: Diagnosis not present

## 2016-01-10 DIAGNOSIS — I10 Essential (primary) hypertension: Secondary | ICD-10-CM | POA: Diagnosis not present

## 2016-01-10 DIAGNOSIS — I4891 Unspecified atrial fibrillation: Secondary | ICD-10-CM | POA: Diagnosis not present

## 2016-01-10 DIAGNOSIS — J449 Chronic obstructive pulmonary disease, unspecified: Secondary | ICD-10-CM | POA: Diagnosis not present

## 2016-01-10 DIAGNOSIS — E669 Obesity, unspecified: Secondary | ICD-10-CM | POA: Diagnosis not present

## 2016-01-10 DIAGNOSIS — E785 Hyperlipidemia, unspecified: Secondary | ICD-10-CM | POA: Diagnosis not present

## 2016-01-10 DIAGNOSIS — I519 Heart disease, unspecified: Secondary | ICD-10-CM | POA: Diagnosis not present

## 2016-01-10 DIAGNOSIS — J189 Pneumonia, unspecified organism: Secondary | ICD-10-CM | POA: Diagnosis not present

## 2016-01-10 DIAGNOSIS — B3323 Viral pericarditis: Secondary | ICD-10-CM | POA: Diagnosis not present

## 2016-01-10 DIAGNOSIS — I4892 Unspecified atrial flutter: Secondary | ICD-10-CM | POA: Insufficient documentation

## 2016-01-10 DIAGNOSIS — I480 Paroxysmal atrial fibrillation: Secondary | ICD-10-CM | POA: Insufficient documentation

## 2016-01-10 DIAGNOSIS — I110 Hypertensive heart disease with heart failure: Secondary | ICD-10-CM | POA: Diagnosis not present

## 2016-01-10 DIAGNOSIS — Z79899 Other long term (current) drug therapy: Secondary | ICD-10-CM | POA: Insufficient documentation

## 2016-01-10 DIAGNOSIS — I5032 Chronic diastolic (congestive) heart failure: Secondary | ICD-10-CM | POA: Insufficient documentation

## 2016-01-10 DIAGNOSIS — Z9889 Other specified postprocedural states: Secondary | ICD-10-CM | POA: Insufficient documentation

## 2016-01-10 DIAGNOSIS — K219 Gastro-esophageal reflux disease without esophagitis: Secondary | ICD-10-CM | POA: Insufficient documentation

## 2016-01-10 DIAGNOSIS — J45909 Unspecified asthma, uncomplicated: Secondary | ICD-10-CM | POA: Diagnosis not present

## 2016-01-10 DIAGNOSIS — J438 Other emphysema: Secondary | ICD-10-CM | POA: Diagnosis not present

## 2016-01-10 DIAGNOSIS — G4733 Obstructive sleep apnea (adult) (pediatric): Secondary | ICD-10-CM | POA: Diagnosis not present

## 2016-01-10 DIAGNOSIS — Z7982 Long term (current) use of aspirin: Secondary | ICD-10-CM | POA: Insufficient documentation

## 2016-01-10 DIAGNOSIS — I509 Heart failure, unspecified: Secondary | ICD-10-CM

## 2016-01-10 DIAGNOSIS — I5040 Unspecified combined systolic (congestive) and diastolic (congestive) heart failure: Secondary | ICD-10-CM | POA: Diagnosis not present

## 2016-01-10 DIAGNOSIS — I2510 Atherosclerotic heart disease of native coronary artery without angina pectoris: Secondary | ICD-10-CM | POA: Insufficient documentation

## 2016-01-10 DIAGNOSIS — Z1211 Encounter for screening for malignant neoplasm of colon: Secondary | ICD-10-CM

## 2016-01-10 NOTE — Progress Notes (Signed)
Patient reports feeling lethargic still but he does try and walk a little everyday to build stamina He reports no appetite He does not need refills on medications He has appointment with cardiology today

## 2016-01-10 NOTE — Progress Notes (Signed)
[place]  Date of telephone encounter: 12/21/15  Admit date: 12/08/15 Discharge date: 12/18/15  PCP: previously Dr Annitta Needs  Subjective:    Patient ID: [name] [name], male    DOB: 12-17-1951, 65 y.o.   MRN: [id number]  HPI  65 year old male with a history of paroxysmal A. fib, atrial flutter, diastolic congestive heart failure (EF 50-55% in 11/2015), CAD (status post cardiac cath in 11/2015),  COPD, GERD who returns today for a follow-up visit at the transitional care clinic after being seen 2 weeks ago. During his last hospitalization he had refused DC cardioversion for treatment of A.fib with RVR and atrial flutter and opted for medical management. He was also treated for hospital-acquired pneumonia and his imaging had revealed pleural effusion and pericarditis and he had refused any invasive intervention; anticoagulation on hold as per cardiology due to pleural effusion. He was discharged on a 7 day course of Levaquin.  Interval history: He complains of shortness of breath on moderate exertion; states he gets out of breath when he walks to his mailbox and back. He does have persisting fatigue which he had complained of at his last visit and a TSH which was sent off came back normal. He has noticed some anorexia and has to force himself to eat about 1-1/2 times day but denies any fever or cough or body aches. He has completed his course of Levaquin. States he could use help with his activities of daily living at home.  Past Medical History  Diagnosis Date  . Hypertension   . Asthma   . Atrial fibrillation ([place])   . CHF (congestive heart failure) ([place])   . Anemia   . Heart disease   . COPD (chronic obstructive pulmonary disease) [facility])     Past Surgical History  Procedure Laterality Date  . Esophagus surgery    . Cardiac catheterization N/A 12/08/2015    Procedure: Left Heart Cath and Coronary Angiography;  Surgeon: Charolette Forward, MD;  Location: [facility];   Service: Cardiovascular;  Laterality: N/A;    Social History   Social History  . Marital Status: Single    Spouse Name: N/A  . Number of Children: 1  . Years of Education: college   Occupational History  . [employer]    Social History Main Topics  . Smoking status: Never Smoker   . Smokeless tobacco: Never Used  . Alcohol Use: No  . Drug Use: No  . Sexual Activity: Not Currently   Other Topics Concern  . Not on file   Social History Narrative   Patient lives at home alone and he is single.   Patient is semi retired.    Education college.   Right handed.   Caffeine two cokes daily.    No Known Allergies  Current Outpatient Prescriptions on File Prior to Visit  Medication Sig Dispense Refill  . albuterol (PROVENTIL HFA;VENTOLIN HFA) 108 (90 BASE) MCG/ACT inhaler Inhale 2 puffs into the lungs every 6 (six) hours as needed. 1 Inhaler 5  . albuterol (PROVENTIL) (2.5 MG/3ML) 0.083% nebulizer solution Take 3 mLs (2.5 mg total) by nebulization every 6 (six) hours as needed for wheezing or shortness of breath. 150 mL 1  . amiodarone (PACERONE) 200 MG tablet Take 1 tablet (200 mg total) by mouth 2 (two) times daily. 60 tablet 2  . aspirin EC 81 MG tablet Take 81 mg by mouth daily.      Marland Kitchen atorvastatin (LIPITOR) 40 MG tablet Take 1  tablet (40 mg total) by mouth daily at 6 PM. 30 tablet 3  . clotrimazole-betamethasone (LOTRISONE) cream Apply 1 application topically 2 (two) times daily. (Patient taking differently: Apply 1 application topically 2 (two) times daily as needed (FOR RASH). ) 30 g 1  . colchicine 0.6 MG tablet Take 1 tablet (0.6 mg total) by mouth 2 (two) times daily. 60 tablet 3  . digoxin (LANOXIN) 0.125 MG tablet Take 1 tablet (0.125 mg total) by mouth daily. 30 tablet 3  . ferrous sulfate 325 (65 FE) MG tablet Take 325 mg by mouth daily with breakfast. Reported on 01/06/2016    . fluticasone-salmeterol (ADVAIR HFA) 115-21 MCG/ACT inhaler Inhale 2 puffs into the lungs  daily as needed. For shortness of breath 1 Inhaler 3  . levETIRAcetam (KEPPRA) 750 MG tablet Take 2 tablets (1,500 mg total) by mouth 2 (two) times daily. (Patient not taking: Reported on 01/06/2016) 120 tablet 11  . levofloxacin (LEVAQUIN) 500 MG tablet Take 1 tablet (500 mg total) by mouth daily. (Patient not taking: Reported on 01/06/2016) 7 tablet 0  . lisinopril (PRINIVIL,ZESTRIL) 20 MG tablet Take 20 mg by mouth daily.     . metoprolol succinate (TOPROL XL) 25 MG 24 hr tablet Take 1 tablet (25 mg total) by mouth daily. 30 tablet 3  . nitroGLYCERIN (NITROSTAT) 0.4 MG SL tablet Place 1 tablet (0.4 mg total) under the tongue every 5 (five) minutes as needed for chest pain. 25 tablet 1  . omeprazole (PRILOSEC) 40 MG capsule Take 1 capsule (40 mg total) by mouth 2 (two) times daily before a meal. 60 capsule 3  . predniSONE (DELTASONE) 20 MG tablet Take 1 tablet (20 mg total) by mouth daily with breakfast. (Patient not taking: Reported on 01/06/2016) 3 tablet 0  . Vitamin D, Ergocalciferol, (DRISDOL) 50000 units CAPS capsule Take 1 capsule (50,000 Units total) by mouth every Wednesday. 10 capsule 2   No current facility-administered medications on file prior to visit.      Review of Systems Constitutional: Negative for fever, chills, diaphoresis, activity change, positive for appetite change and positive for fatigue. HENT: Negative for ear pain, nosebleeds, congestion, facial swelling, rhinorrhea, neck pain, neck stiffness and ear discharge.  Eyes: Negative for pain, discharge, redness, itching and visual disturbance. Respiratory: Negative for cough, choking, chest tightness, positive for dyspnea on moderate exertion Cardiovascular: Negative for chest pain, palpitations and leg swelling. Gastrointestinal: Negative for abdominal distention. Genitourinary: Negative for dysuria, urgency, frequency, hematuria, flank pain, decreased urine volume, difficulty urinating and dyspareunia.  Musculoskeletal:  Negative for back pain, joint swelling, arthralgias and gait problem. Neurological: Negative for dizziness, tremors, seizures, syncope, facial asymmetry, speech difficulty, weakness, light-headedness, numbness and headaches.  Hematological: Negative for adenopathy. Does not bruise/bleed easily. Psychiatric/Behavioral: Negative for hallucinations, behavioral problems, confusion, dysphoric mood, decreased concentration and agitation.      Objective: Filed Vitals:   01/10/16 1541  BP: 138/88  Pulse: 83  Temp: 98.1 F (36.7 C)  Resp: 13  Height: 5' 7.5" (1.715 m)  Weight: 239 lb 9.6 oz (108.682 kg)  SpO2: 100%      Physical Exam  Constitutional: Patient appears well-developed and well-nourished. No distress. HENT: Normocephalic, atraumatic, External right and left ear normal. Oropharynx is clear and moist.  Eyes: Conjunctivae and EOM are normal. PERRLA, no scleral icterus. Neck: Normal ROM. Neck supple. No JVD. No tracheal deviation. No thyromegaly. CVS: RRR, S1/S2 +, no murmurs, no gallops, no carotid bruit.  Pulmonary: Effort and breath sounds normal,  no stridor, rhonchi, wheezes, rales.  Abdominal: Soft. BS +,  no distension, tenderness, rebound or guarding.  Musculoskeletal: Normal range of motion. No edema and no tenderness.  Lymphadenopathy: No lymphadenopathy noted, cervical, inguinal or axillary Neuro: Alert. Normal reflexes, muscle tone coordination. No cranial nerve deficit. Skin: Skin is warm and dry. No rash noted. Not diaphoretic. No erythema. No pallor. Psychiatric: Normal mood and affect. Behavior, judgment, thought content normal.  CMP Latest Ref Rng 12/18/2015 12/15/2015 12/14/2015  Glucose 65 - 99 mg/dL 99 118(H) 135(H)  BUN 6 - 20 mg/dL 17 20 27(H)  Creatinine 0.61 - 1.24 mg/dL 1.10 1.20 1.31(H)  Sodium 135 - 145 mmol/L 141 141 139  Potassium 3.5 - 5.1 mmol/L 3.9 4.7 5.5(H)  Chloride 101 - 111 mmol/L 110 107 109  CO2 22 - 32 mmol/L 21(L) 26 21(L)  Calcium 8.9  - 10.3 mg/dL 8.9 8.5(L) 8.4(L)  Total Protein 6.5 - 8.1 g/dL - 7.1 -  Total Bilirubin 0.3 - 1.2 mg/dL - 0.5 -  Alkaline Phos 38 - 126 U/L - 182(H) -  AST 15 - 41 U/L - 62(H) -  ALT 17 - 63 U/L - 54 -    CBC Latest Ref Rng 12/18/2015 12/15/2015 12/13/2015  WBC 4.0 - 10.5 K/uL 13.0(H) 16.2(H) 24.0(H)  Hemoglobin 13.0 - 17.0 g/dL 11.8(L) 9.8(L) 12.0(L)  Hematocrit 39.0 - 52.0 % 38.1(L) 29.8(L) 35.7(L)  Platelets 150 - 400 K/uL 511(H) 446(H) PLATELET CLUMPS NOTED ON SMEAR, COUNT APPEARS ADEQUATE         Assessment & Plan:  Anorexia: In the absence of other GI symptoms. I am referring him to GI for colonoscopy and possible upper endoscopy.  Atrial fibrillation: Currently on rate control with digoxin and metoprolol and on rhythm control with amiodarone. Not on anticoagulation due to risk of hemorrhagic pericarditis in view of pleural effusion and pericarditis.  Diastolic CHF: EF 99991111, grade 1 diastolic dysfunction from 2-D echo of 11/2015 Stable, no acute exacerbation but he still complains of dyspnea on moderate exertion Oxygen saturation is normal Advised to keep appointment with cardiology.  Fatigue: Last TSH was normal in 11/2015 He will need a vitamin D level is symptoms persist He has been advised to perform activities as tolerated  Healthcare associated pneumonia: Completed a course of  Levaquin. Last WBC was 13.0; I will repeat today to evaluate for resolution Chest x-ray ordered to evaluate for resolution especially with the presence of ongoing fatigue and pleural effusion on previous imaging  COPD: Doing well on Advair and albuterol.  Hypertension:  Controlled  Hyperlipidemia: Controlled on atorvastatin.     We will proceed with completing of an [place]-2 form for [facility] services for him  This note has been created with Surveyor, quantity. Any transcriptional errors are unintentional.

## 2016-01-11 ENCOUNTER — Telehealth: Payer: Self-pay | Admitting: *Deleted

## 2016-01-11 ENCOUNTER — Inpatient Hospital Stay: Payer: Self-pay | Admitting: Adult Health

## 2016-01-11 ENCOUNTER — Encounter: Payer: Self-pay | Admitting: Gastroenterology

## 2016-01-11 DIAGNOSIS — I509 Heart failure, unspecified: Secondary | ICD-10-CM

## 2016-01-11 LAB — CBC WITH DIFFERENTIAL/PLATELET
Basophils Absolute: 0.1 10*3/uL (ref 0.0–0.1)
Basophils Relative: 1 % (ref 0–1)
Eosinophils Absolute: 0.3 10*3/uL (ref 0.0–0.7)
Eosinophils Relative: 4 % (ref 0–5)
HCT: 33 % — ABNORMAL LOW (ref 39.0–52.0)
Hemoglobin: 9.9 g/dL — ABNORMAL LOW (ref 13.0–17.0)
LYMPHS ABS: 2.3 10*3/uL (ref 0.7–4.0)
LYMPHS PCT: 29 % (ref 12–46)
MCH: 20 pg — AB (ref 26.0–34.0)
MCHC: 30 g/dL (ref 30.0–36.0)
MCV: 66.5 fL — ABNORMAL LOW (ref 78.0–100.0)
MPV: 8.7 fL (ref 8.6–12.4)
Monocytes Absolute: 1.1 10*3/uL — ABNORMAL HIGH (ref 0.1–1.0)
Monocytes Relative: 14 % — ABNORMAL HIGH (ref 3–12)
NEUTROS ABS: 4.1 10*3/uL (ref 1.7–7.7)
Neutrophils Relative %: 52 % (ref 43–77)
PLATELETS: 526 10*3/uL — AB (ref 150–400)
RBC: 4.96 MIL/uL (ref 4.22–5.81)
RDW: 16.7 % — ABNORMAL HIGH (ref 11.5–15.5)
WBC: 7.9 10*3/uL (ref 4.0–10.5)

## 2016-01-11 MED ORDER — FUROSEMIDE 20 MG PO TABS
20.0000 mg | ORAL_TABLET | Freq: Every day | ORAL | Status: DC
Start: 2016-01-11 — End: 2016-03-18

## 2016-01-11 NOTE — Telephone Encounter (Signed)
Home Health RN called in to report patient's weight this morning was 238.6 lb and it was 235 lb on 01/05/16.  She states patient reports no worsening of symptoms.  She wanted MD to be aware.  Routing message to MD.

## 2016-01-11 NOTE — Telephone Encounter (Signed)
I have sent a new prescription for Lasix to his pharmacy; also advised to get his chest x-ray done. Wondering if he was able to make it to his cardiology appointment yesterday.

## 2016-01-12 DIAGNOSIS — I2510 Atherosclerotic heart disease of native coronary artery without angina pectoris: Secondary | ICD-10-CM | POA: Diagnosis not present

## 2016-01-12 DIAGNOSIS — J181 Lobar pneumonia, unspecified organism: Secondary | ICD-10-CM | POA: Diagnosis not present

## 2016-01-12 DIAGNOSIS — I110 Hypertensive heart disease with heart failure: Secondary | ICD-10-CM | POA: Diagnosis not present

## 2016-01-12 DIAGNOSIS — M1990 Unspecified osteoarthritis, unspecified site: Secondary | ICD-10-CM | POA: Diagnosis not present

## 2016-01-12 DIAGNOSIS — J440 Chronic obstructive pulmonary disease with acute lower respiratory infection: Secondary | ICD-10-CM | POA: Diagnosis not present

## 2016-01-12 DIAGNOSIS — K219 Gastro-esophageal reflux disease without esophagitis: Secondary | ICD-10-CM | POA: Diagnosis not present

## 2016-01-12 DIAGNOSIS — D649 Anemia, unspecified: Secondary | ICD-10-CM | POA: Diagnosis not present

## 2016-01-12 DIAGNOSIS — I5032 Chronic diastolic (congestive) heart failure: Secondary | ICD-10-CM | POA: Diagnosis not present

## 2016-01-12 DIAGNOSIS — I482 Chronic atrial fibrillation: Secondary | ICD-10-CM | POA: Diagnosis not present

## 2016-01-13 ENCOUNTER — Ambulatory Visit (HOSPITAL_COMMUNITY)
Admission: RE | Admit: 2016-01-13 | Discharge: 2016-01-13 | Disposition: A | Discharge status: Home / Self Care | Payer: Commercial Managed Care - HMO | Source: Ambulatory Visit | Attending: Family Medicine | Admitting: Family Medicine

## 2016-01-13 ENCOUNTER — Telehealth: Payer: Self-pay

## 2016-01-13 ENCOUNTER — Other Ambulatory Visit: Payer: Self-pay | Admitting: *Deleted

## 2016-01-13 DIAGNOSIS — I482 Chronic atrial fibrillation: Secondary | ICD-10-CM | POA: Diagnosis not present

## 2016-01-13 DIAGNOSIS — J189 Pneumonia, unspecified organism: Secondary | ICD-10-CM | POA: Insufficient documentation

## 2016-01-13 DIAGNOSIS — E785 Hyperlipidemia, unspecified: Secondary | ICD-10-CM | POA: Diagnosis not present

## 2016-01-13 DIAGNOSIS — R700 Elevated erythrocyte sedimentation rate: Secondary | ICD-10-CM | POA: Diagnosis not present

## 2016-01-13 DIAGNOSIS — D509 Iron deficiency anemia, unspecified: Secondary | ICD-10-CM | POA: Diagnosis not present

## 2016-01-13 DIAGNOSIS — I10 Essential (primary) hypertension: Secondary | ICD-10-CM | POA: Diagnosis not present

## 2016-01-13 DIAGNOSIS — M359 Systemic involvement of connective tissue, unspecified: Secondary | ICD-10-CM | POA: Diagnosis not present

## 2016-01-13 DIAGNOSIS — E559 Vitamin D deficiency, unspecified: Secondary | ICD-10-CM | POA: Diagnosis not present

## 2016-01-13 DIAGNOSIS — M2550 Pain in unspecified joint: Secondary | ICD-10-CM | POA: Diagnosis not present

## 2016-01-13 DIAGNOSIS — R6889 Other general symptoms and signs: Secondary | ICD-10-CM | POA: Diagnosis not present

## 2016-01-13 DIAGNOSIS — Z79899 Other long term (current) drug therapy: Secondary | ICD-10-CM | POA: Diagnosis not present

## 2016-01-13 DIAGNOSIS — R899 Unspecified abnormal finding in specimens from other organs, systems and tissues: Secondary | ICD-10-CM | POA: Diagnosis not present

## 2016-01-13 DIAGNOSIS — I5032 Chronic diastolic (congestive) heart failure: Secondary | ICD-10-CM | POA: Diagnosis not present

## 2016-01-13 DIAGNOSIS — M1990 Unspecified osteoarthritis, unspecified site: Secondary | ICD-10-CM | POA: Diagnosis not present

## 2016-01-13 DIAGNOSIS — J918 Pleural effusion in other conditions classified elsewhere: Secondary | ICD-10-CM | POA: Insufficient documentation

## 2016-01-13 DIAGNOSIS — I110 Hypertensive heart disease with heart failure: Secondary | ICD-10-CM | POA: Diagnosis not present

## 2016-01-13 DIAGNOSIS — J440 Chronic obstructive pulmonary disease with acute lower respiratory infection: Secondary | ICD-10-CM | POA: Diagnosis not present

## 2016-01-13 DIAGNOSIS — I2510 Atherosclerotic heart disease of native coronary artery without angina pectoris: Secondary | ICD-10-CM | POA: Diagnosis not present

## 2016-01-13 DIAGNOSIS — J181 Lobar pneumonia, unspecified organism: Secondary | ICD-10-CM | POA: Diagnosis not present

## 2016-01-13 DIAGNOSIS — D649 Anemia, unspecified: Secondary | ICD-10-CM | POA: Diagnosis not present

## 2016-01-13 DIAGNOSIS — K219 Gastro-esophageal reflux disease without esophagitis: Secondary | ICD-10-CM | POA: Diagnosis not present

## 2016-01-13 DIAGNOSIS — I480 Paroxysmal atrial fibrillation: Secondary | ICD-10-CM | POA: Diagnosis not present

## 2016-01-13 NOTE — Telephone Encounter (Signed)
-----   Message from Arnoldo Morale, MD sent at 01/11/2016  1:51 PM EST ----- His white cells count has returned to normal however he is anemic with a drop in hemoglobin from his last set of labs. He has a referral to GI which is currently being processed.

## 2016-01-13 NOTE — Telephone Encounter (Signed)
Spoke to patient and verified name and date of birth and gave results.

## 2016-01-13 NOTE — Patient Outreach (Signed)
[facility]) Care Management  01/13/2016  BOLUWATIFE GAVETTE 11-15-1951 [id number]   Assessment: Transition of care call- week 3 Call placed to follow-up transition of care but unable to reach patient on his home phone number. Attempted to call home number twice, but no answer. HIPAA compliant voice message left with name and contact number. Call placed to patient's mobile number listed but was able to reach [facility] taxi. Person who picked up the phone states that patient had not worked there for a while.   Plan: Will await for patient's return call. If unable to receive a call back from patient, will schedule for next outreach call.  Meilech Virts A. Zebediah Beezley, BSN, RN-BC [place] Management Coordinator Cell: [phone]

## 2016-01-13 NOTE — Telephone Encounter (Signed)
Dr. Jarold Song in agreeance that patient may benefit from a [facility]) assessment.  This Case Manager spoke with patient who was agreeable to [place] assessment request being sent to [facility].  Dr. Jarold Song completed form and form faxed to [employer] [phone]).

## 2016-01-13 NOTE — Telephone Encounter (Signed)
Left HIPAA compliant message for patient to return RN call at [phone] 

## 2016-01-17 ENCOUNTER — Other Ambulatory Visit: Payer: Self-pay | Admitting: *Deleted

## 2016-01-17 ENCOUNTER — Telehealth: Payer: Self-pay

## 2016-01-17 DIAGNOSIS — I509 Heart failure, unspecified: Secondary | ICD-10-CM

## 2016-01-17 NOTE — Telephone Encounter (Signed)
CMA called patient, patient verified name and DOB. Patient was given lab results, verbalized that he understood with no further questions.

## 2016-01-17 NOTE — Telephone Encounter (Signed)
-----   Message from Arnoldo Morale, MD sent at 01/14/2016  2:06 PM EST ----- He has trace bilateral pleural effusions; no change in current management.

## 2016-01-17 NOTE — Patient Outreach (Addendum)
[facility]) Care Management  01/17/2016  [name] [name] 1951/10/18 [id number]   Assessment: Transition of care- week 4 Called and spoke with patient to follow-up transition of care. Patient reports unable to get in contact with him on last week's appointment due to procedures (laboratory works and chest x-ray) he has to fulfill as a result of providers' follow-up visits last week. Patient verbalized being "alright" and "breathing well".   Patient reports he was able to pick-up Keppra and Iron refills and started taking them. He denies problems with breathing, coughing, chest pain/ discomfort. He states that his weight remains ranging at 236- 238 pounds. He identified himself being on the "green zone". Although, he reports that swelling remains to his feet and legs. Encouraged patient to elevate his legs as much as he can. Patient states that primary provider has started him on Lasix 20 mg daily and currently taking it as stated. [name] [name] also reports being seen by Neurologist and Pulmonologist last week and he checked out okay.   Per patient's report, he had used Albuterol inhaler once last week and none use of nebulizer at all.  Patient mentions continued loss of appetite. Primary provider is aware and would like for patient to have a colonoscopy done. He was referred to a gastrointestinal specialist (Dr. Loletha Carrow) with appointment on 02/29/16.   Patient states [facility] nurse and occupational therapist continues to see him but physical therapy had signed off since he was doing well. He reports that strength and stamina is gradually improving and currently walking about two blocks, walking to the mailbox needing less rest periods, as stated.  Update received from [name] [name]) about patient's medications and referral to [place] for an aide (personal care services) to assist with patient's activities of daily living. Patient denies knowledge about [place]  [facility] when asked. [name] care coordinator mentions patient may be able to use [place] [facility] since he has Medicaid benefits. Referral made to [place] worker for personal care service resources [facility]).  Patient expressed no other needs at present. He agreed to routine home visit next month. He is aware to call [facility], care management coordinator or 24-hour nurse line if needed. Patient has the contact informations.   Plan: Routine home visit on 02/07/16. Will refer to [place] worker for personal care service [facility]) resources.    Orvella Digiulio A. Dnya Hickle, BSN, RN-BC [place] Management Coordinator Cell: [phone]

## 2016-01-18 NOTE — Addendum Note (Signed)
Addended by: Dannielle Huh A on: 01/18/2016 11:31 AM   Modules accepted: Orders

## 2016-01-19 DIAGNOSIS — I482 Chronic atrial fibrillation: Secondary | ICD-10-CM | POA: Diagnosis not present

## 2016-01-19 DIAGNOSIS — I5032 Chronic diastolic (congestive) heart failure: Secondary | ICD-10-CM | POA: Diagnosis not present

## 2016-01-19 DIAGNOSIS — J181 Lobar pneumonia, unspecified organism: Secondary | ICD-10-CM | POA: Diagnosis not present

## 2016-01-19 DIAGNOSIS — J440 Chronic obstructive pulmonary disease with acute lower respiratory infection: Secondary | ICD-10-CM | POA: Diagnosis not present

## 2016-01-19 DIAGNOSIS — I2510 Atherosclerotic heart disease of native coronary artery without angina pectoris: Secondary | ICD-10-CM | POA: Diagnosis not present

## 2016-01-19 DIAGNOSIS — K219 Gastro-esophageal reflux disease without esophagitis: Secondary | ICD-10-CM | POA: Diagnosis not present

## 2016-01-19 DIAGNOSIS — M1990 Unspecified osteoarthritis, unspecified site: Secondary | ICD-10-CM | POA: Diagnosis not present

## 2016-01-19 DIAGNOSIS — I110 Hypertensive heart disease with heart failure: Secondary | ICD-10-CM | POA: Diagnosis not present

## 2016-01-19 DIAGNOSIS — D649 Anemia, unspecified: Secondary | ICD-10-CM | POA: Diagnosis not present

## 2016-01-20 DIAGNOSIS — R700 Elevated erythrocyte sedimentation rate: Secondary | ICD-10-CM | POA: Diagnosis not present

## 2016-01-20 DIAGNOSIS — R6889 Other general symptoms and signs: Secondary | ICD-10-CM | POA: Diagnosis not present

## 2016-01-23 ENCOUNTER — Emergency Department (HOSPITAL_COMMUNITY)
Admission: EM | Admit: 2016-01-23 | Discharge: 2016-01-23 | Disposition: A | Discharge status: Home / Self Care | Payer: Commercial Managed Care - HMO | Attending: Emergency Medicine | Admitting: Emergency Medicine

## 2016-01-23 ENCOUNTER — Emergency Department (HOSPITAL_COMMUNITY): Payer: Commercial Managed Care - HMO

## 2016-01-23 ENCOUNTER — Encounter (HOSPITAL_COMMUNITY): Payer: Self-pay | Admitting: *Deleted

## 2016-01-23 DIAGNOSIS — Z9889 Other specified postprocedural states: Secondary | ICD-10-CM | POA: Diagnosis not present

## 2016-01-23 DIAGNOSIS — R030 Elevated blood-pressure reading, without diagnosis of hypertension: Secondary | ICD-10-CM | POA: Diagnosis not present

## 2016-01-23 DIAGNOSIS — I5030 Unspecified diastolic (congestive) heart failure: Secondary | ICD-10-CM | POA: Insufficient documentation

## 2016-01-23 DIAGNOSIS — R001 Bradycardia, unspecified: Secondary | ICD-10-CM

## 2016-01-23 DIAGNOSIS — Z79899 Other long term (current) drug therapy: Secondary | ICD-10-CM | POA: Diagnosis not present

## 2016-01-23 DIAGNOSIS — J90 Pleural effusion, not elsewhere classified: Secondary | ICD-10-CM | POA: Diagnosis not present

## 2016-01-23 DIAGNOSIS — I10 Essential (primary) hypertension: Secondary | ICD-10-CM | POA: Diagnosis not present

## 2016-01-23 DIAGNOSIS — R42 Dizziness and giddiness: Secondary | ICD-10-CM | POA: Insufficient documentation

## 2016-01-23 DIAGNOSIS — Z7982 Long term (current) use of aspirin: Secondary | ICD-10-CM | POA: Insufficient documentation

## 2016-01-23 DIAGNOSIS — J449 Chronic obstructive pulmonary disease, unspecified: Secondary | ICD-10-CM | POA: Insufficient documentation

## 2016-01-23 DIAGNOSIS — R5383 Other fatigue: Secondary | ICD-10-CM | POA: Diagnosis not present

## 2016-01-23 DIAGNOSIS — D649 Anemia, unspecified: Secondary | ICD-10-CM | POA: Insufficient documentation

## 2016-01-23 LAB — CBC WITH DIFFERENTIAL/PLATELET
BASOS PCT: 1 %
Basophils Absolute: 0.1 10*3/uL (ref 0.0–0.1)
EOS PCT: 2 %
Eosinophils Absolute: 0.2 10*3/uL (ref 0.0–0.7)
HEMATOCRIT: 33.5 % — AB (ref 39.0–52.0)
HEMOGLOBIN: 10.1 g/dL — AB (ref 13.0–17.0)
LYMPHS ABS: 2.1 10*3/uL (ref 0.7–4.0)
LYMPHS PCT: 27 %
MCH: 20.1 pg — AB (ref 26.0–34.0)
MCHC: 30.1 g/dL (ref 30.0–36.0)
MCV: 66.7 fL — AB (ref 78.0–100.0)
MONOS PCT: 10 %
Monocytes Absolute: 0.8 10*3/uL (ref 0.1–1.0)
NEUTROS ABS: 4.6 10*3/uL (ref 1.7–7.7)
Neutrophils Relative %: 60 %
Platelets: 454 10*3/uL — ABNORMAL HIGH (ref 150–400)
RBC: 5.02 MIL/uL (ref 4.22–5.81)
RDW: 16.9 % — AB (ref 11.5–15.5)
WBC: 7.8 10*3/uL (ref 4.0–10.5)

## 2016-01-23 LAB — BASIC METABOLIC PANEL
Anion gap: 8 (ref 5–15)
BUN: 7 mg/dL (ref 6–20)
CALCIUM: 8.8 mg/dL — AB (ref 8.9–10.3)
CHLORIDE: 105 mmol/L (ref 101–111)
CO2: 29 mmol/L (ref 22–32)
CREATININE: 1.21 mg/dL (ref 0.61–1.24)
GFR calc non Af Amer: >60 mL/min (ref >60)
GLUCOSE: 103 mg/dL — AB (ref 65–99)
Potassium: 4.2 mmol/L (ref 3.5–5.1)
Sodium: 142 mmol/L (ref 135–145)

## 2016-01-23 LAB — CBG MONITORING, ED: GLUCOSE-CAPILLARY: 71 mg/dL (ref 65–99)

## 2016-01-23 LAB — TSH: TSH: 1.167 u[IU]/mL (ref 0.350–4.500)

## 2016-01-23 NOTE — ED Provider Notes (Signed)
CSN: [id number]     Arrival date & time 01/23/16  1352 History   First MD Initiated Contact with Patient 01/23/16 1408     Chief Complaint  Patient presents with  . Fatigue  . Bradycardia     (Consider location/radiation/quality/duration/timing/severity/associated sxs/prior Treatment) HPI Comments: Patient with h/o diastolic CHF, admission [id number] with PNA, pleuropericarditis, underwent LHC showing diffuse non-obstructive heart disease -- presents with c/o generalized fatigue and 'not feeling right'. Also describes mild lightheadedness but no near syncope/syncope. He denies fever, URI sx, CP, worsening SOB, N/V/D, urinary sx. No LE edema or skin rash. EMS was called and found patient to be bradycardic in 40's. Pt is on amiodarone and metoprolol. He states that he doesn't think anything serious is wrong, but wanted to get checked out at urging of EMT and given everything that occurred to him on previous admit. No treatment PTA. The onset of this condition was acute. The course is constant. Aggravating factors: none. Alleviating factors: none.    The history is provided by the patient and medical records.    Past Medical History  Diagnosis Date  . Hypertension   . Asthma   . Atrial fibrillation ([place])   . CHF (congestive heart failure) ([place])   . Anemia   . Heart disease   . COPD (chronic obstructive pulmonary disease) [facility])    Past Surgical History  Procedure Laterality Date  . Esophagus surgery    . Cardiac catheterization N/A 12/08/2015    Procedure: Left Heart Cath and Coronary Angiography;  Surgeon: Charolette Forward, MD;  Location: [place] [facility];  Service: Cardiovascular;  Laterality: N/A;   Family History  Problem Relation Age of Onset  . Cancer Mother    Social History  Substance Use Topics  . Smoking status: Never Smoker   . Smokeless tobacco: Never Used  . Alcohol Use: No    Review of Systems  Constitutional: Positive for fatigue. Negative for fever.  HENT:  Negative for rhinorrhea and sore throat.   Eyes: Negative for redness.  Respiratory: Negative for cough and shortness of breath.   Cardiovascular: Negative for chest pain.  Gastrointestinal: Negative for nausea, vomiting, abdominal pain and diarrhea.  Genitourinary: Negative for dysuria.  Musculoskeletal: Negative for myalgias.  Skin: Negative for rash.  Neurological: Positive for light-headedness. Negative for headaches.    Allergies  Review of patient's allergies indicates no known allergies.  Home Medications   Prior to Admission medications   Medication Sig Start Date End Date Taking? Authorizing Provider  albuterol (PROVENTIL HFA;VENTOLIN HFA) 108 (90 BASE) MCG/ACT inhaler Inhale 2 puffs into the lungs every 6 (six) hours as needed. 03/12/15   Lorayne Marek, MD  albuterol (PROVENTIL) (2.5 MG/3ML) 0.083% nebulizer solution Take 3 mLs (2.5 mg total) by nebulization every 6 (six) hours as needed for wheezing or shortness of breath. 12/23/15   Arnoldo Morale, MD  amiodarone (PACERONE) 200 MG tablet Take 1 tablet (200 mg total) by mouth 2 (two) times daily. 12/18/15   Charolette Forward, MD  aspirin EC 81 MG tablet Take 81 mg by mouth daily.      Historical Provider, MD  atorvastatin (LIPITOR) 40 MG tablet Take 1 tablet (40 mg total) by mouth daily at 6 PM. 12/18/15   Charolette Forward, MD  clotrimazole-betamethasone (LOTRISONE) cream Apply 1 application topically 2 (two) times daily. Patient taking differently: Apply 1 application topically 2 (two) times daily as needed (FOR RASH).  03/12/15   Lorayne Marek, MD  colchicine 0.6 MG tablet  Take 1 tablet (0.6 mg total) by mouth 2 (two) times daily. 12/18/15   Charolette Forward, MD  digoxin (LANOXIN) 0.125 MG tablet Take 1 tablet (0.125 mg total) by mouth daily. 12/18/15   Charolette Forward, MD  ferrous sulfate 325 (65 FE) MG tablet Take 325 mg by mouth daily with breakfast. Reported on 01/06/2016    Historical Provider, MD  fluticasone-salmeterol (ADVAIR HFA)  115-21 MCG/ACT inhaler Inhale 2 puffs into the lungs daily as needed. For shortness of breath 03/12/15   Lorayne Marek, MD  furosemide (LASIX) 20 MG tablet Take 1 tablet (20 mg total) by mouth daily. 01/11/16   Arnoldo Morale, MD  levETIRAcetam (KEPPRA) 750 MG tablet Take 2 tablets (1,500 mg total) by mouth 2 (two) times daily. 04/01/15   Marcial Pacas, MD  lisinopril (PRINIVIL,ZESTRIL) 20 MG tablet Take 20 mg by mouth daily.  12/02/14   Historical Provider, MD  metoprolol succinate (TOPROL XL) 25 MG 24 hr tablet Take 1 tablet (25 mg total) by mouth daily. 12/18/15   Charolette Forward, MD  nitroGLYCERIN (NITROSTAT) 0.4 MG SL tablet Place 1 tablet (0.4 mg total) under the tongue every 5 (five) minutes as needed for chest pain. Patient not taking: Reported on 01/17/2016 01/25/14   Dixie Dials, MD  omeprazole (PRILOSEC) 40 MG capsule Take 1 capsule (40 mg total) by mouth 2 (two) times daily before a meal. 10/10/13   Debbe Odea, MD  Vitamin D, Ergocalciferol, (DRISDOL) 50000 units CAPS capsule Take 1 capsule (50,000 Units total) by mouth every Wednesday. 12/23/15   Arnoldo Morale, MD   BP 139/97 mmHg  Pulse 46  Temp(Src) 97.7 F (36.5 C) (Oral)  Resp 16  SpO2 99%   Physical Exam  Constitutional: He appears well-developed and well-nourished.  HENT:  Head: Normocephalic and atraumatic.  Eyes: Conjunctivae are normal. Right eye exhibits no discharge. Left eye exhibits no discharge.  Neck: Normal range of motion. Neck supple.  Cardiovascular: Regular rhythm and normal heart sounds.  Bradycardia present.   No murmur heard. Pulmonary/Chest: Effort normal and breath sounds normal. No respiratory distress. He has no wheezes. He has no rales.  Abdominal: Soft. There is no tenderness.  Neurological: He is alert.  Skin: Skin is warm and dry.  Psychiatric: He has a normal mood and affect.  Nursing note and vitals reviewed.   ED Course  Procedures (including critical care time) Labs Review Labs Reviewed  CBC WITH  DIFFERENTIAL/PLATELET - Abnormal; Notable for the following:    Hemoglobin 10.1 (*)    HCT 33.5 (*)    MCV 66.7 (*)    MCH 20.1 (*)    RDW 16.9 (*)    Platelets 454 (*)    All other components within normal limits  BASIC METABOLIC PANEL - Abnormal; Notable for the following:    Glucose, Bld 103 (*)    Calcium 8.8 (*)    All other components within normal limits  URINALYSIS, ROUTINE W REFLEX MICROSCOPIC (NOT AT [facility])  TSH  AMIODARONE LEVEL  CBG MONITORING, ED    Imaging Review Dg Chest 2 View  01/23/2016  CLINICAL DATA:  Fatigue EXAM: CHEST  2 VIEW COMPARISON:  01/13/2016 FINDINGS: Cardiac shadow is mildly enlarged but stable. Scarring is noted in both lungs stable from the prior exam. Minimal left pleural effusion is again seen. The right pleural effusion has resolved. No focal infiltrate is seen. No bony abnormality is noted. IMPRESSION: Mild residual left pleural effusion. Electronically Signed   By: Linus Mako.D.  On: 01/23/2016 14:41   I have personally reviewed and evaluated these images and lab results as part of my medical decision-making.   EKG Interpretation   Date/Time:  Sunday January 23 2016 14:03:27 EST Ventricular Rate:  45 PR Interval:  176 QRS Duration: 96 QT Interval:  481 QTC Calculation: 416 R Axis:   29 Text Interpretation:  Sinus bradycardia Confirmed by KNOTT MD, DANIEL  [id number]) on 01/23/2016 2:13:18 PM       3:17 PM Patient seen and examined. Work-up initiated. Medications ordered.   Vital signs reviewed and are as follows: BP 139/97 mmHg  Pulse 46  Temp(Src) 97.7 F (36.5 C) (Oral)  Resp 16  SpO2 99%  Pt discussed with and seen by Dr. Laneta Simmers.   4:13 PM patient updated on results. He is stable.  TSH/amiodarone level pending and feel that these can be followed up by his PCP. Discussed that he may be symptomatic due to bradycardia. He needs to recheck with his doctor to see if any of his medications need adjustment. We discussed that given  minimal symptoms at this time, I would not make any changes now.  Patient encouraged to return with worsening symptoms, lightheadedness or syncope. He states that he is feeling better.  MDM   Final diagnoses:  Other fatigue  Bradycardia   Patient with generalized fatigue. No syncope or near syncope. EKG shows bradycardia. Possibly related to metoprolol or amiodarone. Levels pending. Patient appears well. Asymptomatic at current time. Follow-up as above.    Carlisle Cater, PA-C 01/23/16 1619  Leo Grosser, MD 01/24/16 [phone]

## 2016-01-23 NOTE — ED Notes (Signed)
Pt seen by PTAR earlier this am for "not feeling right".  States he's normally up at 0600 but slept to 11.  HR in 40's, bp 160/90.  12-lead showed sinus brady. Started on a beta blocker in December. Denies any changes in bowel, bladder or bowel habits.

## 2016-01-23 NOTE — Discharge Instructions (Signed)
Please read and follow all provided instructions.  Your diagnoses today include:  1. Other fatigue   2. Bradycardia    Tests performed today include:  Blood counts and electrolytes  Thyroid test and amiodarone level - needs to be checked by your doctor  Vital signs. See below for your results today.   Medications prescribed:   None  Take any prescribed medications only as directed.  Home care instructions:  Follow any educational materials contained in this packet.  BE VERY CAREFUL not to take multiple medicines containing Tylenol (also called acetaminophen). Doing so can lead to an overdose which can damage your liver and cause liver failure and possibly death.   Follow-up instructions: Please follow-up with your primary care provider in the next 3 days for further evaluation of your symptoms.   Return instructions:   Please return to the Emergency Department if you experience worsening symptoms.   Please return if you have any other emergent concerns.  Additional Information:  Your vital signs today were: BP 155/76 mmHg   Pulse 46   Temp(Src) 97.8 F (36.6 C) (Oral)   Resp 17   SpO2 100% If your blood pressure (BP) was elevated above 135/85 this visit, please have this repeated by your doctor within one month. --------------

## 2016-01-24 ENCOUNTER — Encounter: Payer: Self-pay | Admitting: Adult Health

## 2016-01-24 ENCOUNTER — Other Ambulatory Visit: Payer: Self-pay | Admitting: *Deleted

## 2016-01-24 ENCOUNTER — Other Ambulatory Visit: Payer: Commercial Managed Care - HMO

## 2016-01-24 ENCOUNTER — Ambulatory Visit (INDEPENDENT_AMBULATORY_CARE_PROVIDER_SITE_OTHER)
Admission: RE | Admit: 2016-01-24 | Discharge: 2016-01-24 | Disposition: A | Discharge status: Home / Self Care | Payer: Commercial Managed Care - HMO | Source: Ambulatory Visit | Attending: Adult Health | Admitting: Adult Health

## 2016-01-24 ENCOUNTER — Ambulatory Visit (INDEPENDENT_AMBULATORY_CARE_PROVIDER_SITE_OTHER): Payer: Commercial Managed Care - HMO | Admitting: Adult Health

## 2016-01-24 ENCOUNTER — Encounter: Payer: Self-pay | Admitting: *Deleted

## 2016-01-24 VITALS — BP 128/84 | HR 58 | Temp 98.0°F | Ht 67.0 in | Wt 238.0 lb

## 2016-01-24 DIAGNOSIS — J948 Other specified pleural conditions: Secondary | ICD-10-CM

## 2016-01-24 DIAGNOSIS — R6889 Other general symptoms and signs: Secondary | ICD-10-CM | POA: Diagnosis not present

## 2016-01-24 DIAGNOSIS — J4530 Mild persistent asthma, uncomplicated: Secondary | ICD-10-CM

## 2016-01-24 DIAGNOSIS — I10 Essential (primary) hypertension: Secondary | ICD-10-CM | POA: Diagnosis not present

## 2016-01-24 DIAGNOSIS — J9600 Acute respiratory failure, unspecified whether with hypoxia or hypercapnia: Secondary | ICD-10-CM

## 2016-01-24 DIAGNOSIS — J189 Pneumonia, unspecified organism: Secondary | ICD-10-CM

## 2016-01-24 DIAGNOSIS — J90 Pleural effusion, not elsewhere classified: Secondary | ICD-10-CM

## 2016-01-24 NOTE — Assessment & Plan Note (Signed)
Suspected pleuropericarditis ? Viral -improved with steroids and colchicine.  Effusion Resolved on cxr  Recent admission with HCAP and pleuropericarditis improved with abx.  Autoimmune/CTD workup neg except for DS DNA  Will repeat this test today suspect may be false positive during critical illness.   Plan   Continue on current regimen  Labs today .  Follow up with Dr. Chase Caller in 2-3 months with PFT  Please contact office for sooner follow up if symptoms do not improve or worsen or seek emergency care

## 2016-01-24 NOTE — Assessment & Plan Note (Signed)
Improved with abx  cxr shows clearing   Plan  Continue on current regimen  Labs today .  Follow up with Dr. Chase Caller in 2-3 months with PFT  Please contact office for sooner follow up if symptoms do not improve or worsen or seek emergency care

## 2016-01-24 NOTE — Progress Notes (Signed)
Subjective:    Patient ID: [name] [name], male    DOB: 19-Aug-1951, 65 y.o.   MRN: [id number]  HPI 65 yo male never smoker seen for pulmonary consult during hospitalization 11/2015 for LLL HCAP and Pleuropericarditis   01/24/2016 [facility] follow up  Pt was admitted to hospital with chest pain. Went to cath labs. Had A Fib w/ RVR post procedure.  CT chest 12/20 showed moderate pericardial effusion . , small to mod left and small right pleural effusion and patchy bilateral lower lobe opacities. 2 D echo showed mild pericardial effusion with no evidence of tamponade. Autoimmune/Connective tissue disorder workup w/ repeat Rheumatoid factor neg.  Neg ANA . Scleroderma NEG . 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 Marland Kitchen  He was treated with IV abx .  He was felt to have a pleuropericarditis ? Viral related. He improved with stress dose steroids and colchicine .  He is feeling better since discharge.  Decreased cough and dyspnea.  Still has not regained energy level.  CXR today shows improvement with residual basilar pleural thickening ? Scarring noted. Effusions resolved.       Past Medical History  Diagnosis Date  . Hypertension   . Asthma   . Atrial fibrillation ([place])   . CHF (congestive heart failure) ([place])   . Anemia   . Heart disease   . COPD (chronic obstructive pulmonary disease) ([place])    Current Outpatient Prescriptions on File Prior to Visit  Medication Sig Dispense Refill  . albuterol (PROVENTIL HFA;VENTOLIN HFA) 108 (90 BASE) MCG/ACT inhaler Inhale 2 puffs into the lungs every 6 (six) hours as needed. 1 Inhaler 5  . albuterol (PROVENTIL) (2.5 MG/3ML) 0.083% nebulizer solution Take 3 mLs (2.5 mg total) by nebulization every 6 (six) hours as needed for wheezing or shortness of breath. 150 mL 1  . amiodarone (PACERONE) 200 MG tablet Take 1 tablet (200 mg total) by mouth 2 (two) times daily. 60 tablet 2  . aspirin EC 81 MG tablet Take 81 mg by mouth daily.      Marland Kitchen atorvastatin (LIPITOR) 40 MG  tablet Take 1 tablet (40 mg total) by mouth daily at 6 PM. 30 tablet 3  . clotrimazole-betamethasone (LOTRISONE) cream Apply 1 application topically 2 (two) times daily. (Patient taking differently: Apply 1 application topically 2 (two) times daily as needed (FOR RASH). ) 30 g 1  . colchicine 0.6 MG tablet Take 1 tablet (0.6 mg total) by mouth 2 (two) times daily. 60 tablet 3  . digoxin (LANOXIN) 0.125 MG tablet Take 1 tablet (0.125 mg total) by mouth daily. 30 tablet 3  . ferrous sulfate 325 (65 FE) MG tablet Take 325 mg by mouth daily with breakfast. Reported on 01/06/2016    . fluticasone-salmeterol (ADVAIR HFA) 115-21 MCG/ACT inhaler Inhale 2 puffs into the lungs daily as needed. For shortness of breath 1 Inhaler 3  . furosemide (LASIX) 20 MG tablet Take 1 tablet (20 mg total) by mouth daily. 30 tablet 1  . levETIRAcetam (KEPPRA) 750 MG tablet Take 2 tablets (1,500 mg total) by mouth 2 (two) times daily. 120 tablet 11  . lisinopril (PRINIVIL,ZESTRIL) 20 MG tablet Take 20 mg by mouth daily.     . metoprolol succinate (TOPROL XL) 25 MG 24 hr tablet Take 1 tablet (25 mg total) by mouth daily. 30 tablet 3  . nitroGLYCERIN (NITROSTAT) 0.4 MG SL tablet Place 1 tablet (0.4 mg total) under the tongue every 5 (five) minutes as needed for chest pain. 25  tablet 1  . omeprazole (PRILOSEC) 40 MG capsule Take 1 capsule (40 mg total) by mouth 2 (two) times daily before a meal. 60 capsule 3  . Vitamin D, Ergocalciferol, (DRISDOL) 50000 units CAPS capsule Take 1 capsule (50,000 Units total) by mouth every Wednesday. 10 capsule 2   No current facility-administered medications on file prior to visit.      Review of Systems  Constitutional:   No  weight loss, night sweats,  Fevers, chills,  +fatigue, or  lassitude.  HEENT:   No headaches,  Difficulty swallowing,  Tooth/dental problems, or  Sore throat,                No sneezing, itching, ear ache, nasal congestion, post nasal drip,   CV:  No chest pain,   Orthopnea, PND, swelling in lower extremities, anasarca, dizziness, palpitations, syncope.   GI  No heartburn, indigestion, abdominal pain, nausea, vomiting, diarrhea, change in bowel habits, loss of appetite, bloody stools.   Resp: No shortness of breath with exertion or at rest.  No excess mucus, no productive cough,  No non-productive cough,  No coughing up of blood.  No change in color of mucus.  No wheezing.  No chest wall deformity  Skin: no rash or lesions.  GU: no dysuria, change in color of urine, no urgency or frequency.  No flank pain, no hematuria   MS:  No joint pain or swelling.  No decreased range of motion.  No back pain.  Psych:  No change in mood or affect. No depression or anxiety.  No memory loss.         Objective:   Physical Exam Filed Vitals:   01/24/16 1043  BP: 128/84  Pulse: 58  Temp: 98 F (36.7 C)  TempSrc: Oral  Height: 5\' 7"  (1.702 m)  Weight: 238 lb (107.956 kg)  SpO2: 100%   Body mass index is 37.27 kg/(m^2).  GEN: A/Ox3; pleasant , NAD, well nourished   HEENT:  [place]/AT,  EACs-clear, TMs-wnl, NOSE-clear, THROAT-clear, no lesions, no postnasal drip or exudate noted.   NECK:  Supple w/ fair ROM; no JVD; normal carotid impulses w/o bruits; no thyromegaly or nodules palpated; no lymphadenopathy.  RESP  Clear  P & A; w/o, wheezes/ rales/ or rhonchi.no accessory muscle use, no dullness to percussion  CARD:  RRR, no m/r/g  , tr  peripheral edema, pulses intact, no cyanosis or clubbing.  GI:   Soft & nt; nml bowel sounds; no organomegaly or masses detected.  Musco: Warm bil, no deformities or joint swelling noted.   Neuro: alert, no focal deficits noted.    Skin: Warm, no lesions or rashes   CXR 01/24/2016  Mediastinum and hilar structures normal. Low lung volume with basilar pleural-parenchymal thickening most consistent with scarring again noted. No pleural effusion. Heart size normal. No acute bony Abnormality.reveiwed independently        Assessment & Plan:

## 2016-01-24 NOTE — Patient Instructions (Signed)
Continue on current regimen  Labs today .  Follow up with Dr. Chase Caller in 2-3 months with PFT  Please contact office for sooner follow up if symptoms do not improve or worsen or seek emergency care

## 2016-01-24 NOTE — Assessment & Plan Note (Signed)
Compensated on present regimen  Check PFT on return

## 2016-01-24 NOTE — Patient Outreach (Signed)
[facility]) Care Management  01/24/2016  [name] [name] Apr 30, 1951 [id number]  CSW received a new referral on patient from Dannielle Huh, [facility] with [place] Management, reporting that patient would benefit from social work services and resources to assist patient with obtaining home care services.  More specifically, Mrs. Ajel reports that patient's [employer] Case Manager believes that patient may qualify for PCS ([place]) through [facility], by using her Adult Medicaid benefit.  After thorough review of patient's EMR (Electronic Medical Record) in [employer], CSW noted that patient's Primary Care Physician, Dr. Johna Sheriff Case Manager, Carmela Hurt has already completed an application for [facility] and submitted it on January 13, 2016 for processing.  CSW will contact [facility] to check the status of patient's application.  CSW then made an initial attempt to try and contact patient to perform phone assessment, as well as assess and assist with social work needs and services; however, patient was unavailable.  A HIPAA compliant message was left on voicemail.  CSW is currently awaiting a return call.  Nat Christen, BSW, MSW, LCSW  Licensed Education officer, [facility]  Mailing [place] [address] [place], [place] [address] Physical Address-[address], [place], [place] [address] Toll Free Main # [phone] Fax # [phone] Cell # [phone]  Fax # [phone]  [email or web address] complies with [employer] civil rights laws and does not discriminate on the basis of race, color, national origin, age, disability, or sex.  Espaol (Spanish)  [facility] cumple con las leyes federales de derechos civiles aplicables y no discrimina por motivos de raza, color, nacionalidad, edad, discapacidad o sexo.     Ti?ng  Vi?t ([place])  [place] tun th? lu?t dn quy?n hi?n hnh c?a Lin bang v khng phn bi?t ?i x? d?a trn ch?ng t?c, mu da, ngu?n g?c qu?c gia, ? tu?i, khuy?t t?t, ho?c gi?i tnh.     (Arabic)

## 2016-01-25 ENCOUNTER — Telehealth: Payer: Self-pay

## 2016-01-25 LAB — AMIODARONE LEVEL
AMIODARONE LVL: 0.8 ug/mL — AB (ref 1.0–2.5)
N-DESETHYL-AMIODARONE: 0.5 ug/mL — AB (ref 1.0–2.5)

## 2016-01-25 LAB — ANTI-DNA ANTIBODY, DOUBLE-STRANDED: DS DNA AB: 6 [IU]/mL — AB

## 2016-01-25 NOTE — Progress Notes (Signed)
Quick Note:  Called and spoke with pt. Reviewed results and recs. Pt voiced understanding and had no further questions. ______ 

## 2016-01-25 NOTE — Telephone Encounter (Signed)
Call placed to the patient to check on his status as he had been in the ED on 01/23/16 and he had an appointment with Pulmonary on 01/24/16.  He stated that he is feeling "a lot better."  He stated that he was only in the ED for a few hours and thinks he may have had a " reaction to a medication."   He said that everything is " ok" now and then  he stated that he was not home at this time. He confirmed that he has all of his medications and has been taking them as ordered.    He noted that he believes that  [facility] is coming out this week to do an assessment for the [facility] services because he thinks they called him last week. He was not at home to confirm when/if they called.   No other problems/ questions reported.

## 2016-01-26 ENCOUNTER — Telehealth: Payer: Self-pay

## 2016-01-26 NOTE — Telephone Encounter (Signed)
This Case Manager placed call to [employer] to check on status of patient's Architect. Spoke with Brenton Grills who indicated they did not have patient's request on file and requested Personal Care Services assessment request to be faxed once again.  Form faxed to [employer] [phone]). Fax confirmation received.

## 2016-01-27 DIAGNOSIS — I5032 Chronic diastolic (congestive) heart failure: Secondary | ICD-10-CM | POA: Diagnosis not present

## 2016-01-27 DIAGNOSIS — I482 Chronic atrial fibrillation: Secondary | ICD-10-CM | POA: Diagnosis not present

## 2016-01-27 DIAGNOSIS — J440 Chronic obstructive pulmonary disease with acute lower respiratory infection: Secondary | ICD-10-CM | POA: Diagnosis not present

## 2016-01-27 DIAGNOSIS — I2510 Atherosclerotic heart disease of native coronary artery without angina pectoris: Secondary | ICD-10-CM | POA: Diagnosis not present

## 2016-01-27 DIAGNOSIS — M1990 Unspecified osteoarthritis, unspecified site: Secondary | ICD-10-CM | POA: Diagnosis not present

## 2016-01-27 DIAGNOSIS — K219 Gastro-esophageal reflux disease without esophagitis: Secondary | ICD-10-CM | POA: Diagnosis not present

## 2016-01-27 DIAGNOSIS — I110 Hypertensive heart disease with heart failure: Secondary | ICD-10-CM | POA: Diagnosis not present

## 2016-01-27 DIAGNOSIS — J181 Lobar pneumonia, unspecified organism: Secondary | ICD-10-CM | POA: Diagnosis not present

## 2016-01-27 DIAGNOSIS — D649 Anemia, unspecified: Secondary | ICD-10-CM | POA: Diagnosis not present

## 2016-01-31 ENCOUNTER — Other Ambulatory Visit: Payer: Self-pay | Admitting: *Deleted

## 2016-01-31 NOTE — Patient Outreach (Addendum)
[facility]) Care Management  01/31/2016  [name] [name] 11-Aug-1951 [id number]   CSW made a second attempt to try and contact patient today to perform phone assessment, as well as assess and assist with social work needs and services, without success.  A HIPAA compliant message was left for patient on voicemail.  CSW is currently awaiting a return call.  Nat Christen, BSW, MSW, LCSW  Licensed Education officer, environmental Health System  Mailing [place] [address], [place], [place] [address] Physical Address-[address], [place], [place] [address] Toll Free Main # [phone] Fax # [phone] Cell # [phone]  Fax # [phone]  Di [email or web address] complies with [employer] civil rights laws and does not discriminate on the basis of race, color, national origin, age, disability, or sex.  Espaol (Spanish)  [place] cumple con las leyes federales de derechos civiles aplicables y no discrimina por motivos de raza, color, nacionalidad, edad, discapacidad o sexo.     Ti?ng Vi?t ([place])  F[place]proof tun th? lu?t dn quy?n hi?n hnh c?a Lin bang v khng phn bi?t ?i x? d?a trn ch?ng t?c, mu da, ngu?n g?c qu?c gia, ? tu?i, khuy?t t?t, ho?c gi?i tnh.     (Arabic)    [employer] is Against the [employer]. and its subsidiaries comply with [employer] civil rights laws and do not discriminate on the basis of race, color, national origin, age, disability, or sex. [place] do not exclude people or treat them differently because of race, color, national origin, age, disability, or sex.    [employer]. and its subsidiaries provide:   . Free auxiliary aids and services, such as qualified sign language interpreters, video remote interpretation, and written information in other formats to people with disabilities when such auxiliary aids and services are necessary to ensure an equal opportunity to participate. . Free language services to people whose primary language is not English when those services are necessary to provide meaningful access, such as translated documents or oral interpretation.    If you need these services, call [phone] or if you use a TTY, call 711.   If you believe that [employer]. and its subsidiaries have failed to provide these services or discriminated in another way on the basis of race, color, national origin, age, disability, or sex, you can file a Tourist information centre manager with:   Discrimination [place] [address]   If you need help filing a grievance, call [phone] or if you use a TTY, call 711.  You can also file a civil rights complaint with the U.S. Department of Health and Financial controller, Office for Civil Rights electronically through the Office for Civil Rights Complaint Portal, available at OnSiteLending.nl.jsf, or by mail or phone at:   [place]. Department of Health and Human Services  [place], [place], [facility]  [place], [place]  [phone], [phone] (TDD)  Complaint forms are available at [email or web address] [place]: ATTENTION: If you do not speak English, language assistance services, free of charge, are available to you. Call [phone] (TTY: [id number]).  Espaol (Spanish): ATENCIN: si habla espaol, tiene a su disposicin servicios gratuitos de asistencia lingstica. Llame  al [phone] (TTY: [id number]). ???? (Chinese): ?????????????????????????????? [phone]?TTY: 711??  Ti?ng Vi?t (Vietnamese): CH : N?u b?n ni  Ti?ng Vi?t, c cc d?ch v? h? tr? ngn ng? mi?n ph dnh cho b?n. G?i s? [phone] (TTY: [id number]).  ??? ([place]): ?? : ???? ????? ?? , ?? ?? ???? ??? ???? ? ???? . [phone] (TTY: 711)??? ??? ???? .  Tagalog (Tagalog - Filipino): PAUNAWA: Kung nagsasalita ka ng Tagalog, maaari kang gumamit ng mga serbisyo ng tulong sa wika nang walang bayad. [place] sa [phone] (TTY: [id number]).   [place]): :      ,      .  [phone] (: [id number]).  Kreyl Ayisyen ([place]): ATANSYON: Si w pale Ethelene Hal, gen svis d pou lang ki disponib gratis pou ou. Rele [phone] (TTY: [id number]).  Fonnie Jarvis Mar[place] Kitchen[place]): ATTENTION : Si vous parlez franais, des services d'aide linguistique vous sont proposs gratuitement. Appelez le [phone] (ATS : [id number]).  Polski (Polish): UWAGA: Jeeli mwisz po polsku, moesz skorzysta z bezpatnej pomocy jzykowej. Zadzwo pod numer [phone] (TTY: [id number]).  Portugus ([place]): ATENO: Se fala portugus, encontram-se disponveis servios lingusticos, grtis. Ligue para [phone] (TTY: [id number]).  Italiano ([place]): ATTENZIONE: In caso la lingua parlata sia l'italiano, sono disponibili servizi di assistenza linguistica gratuiti. Chiamare il numero [phone] (TTY: [id number]).  Dawayne Patricia ([place]): ACHTUNG: Wenn Sie Deutsch sprechen,  stehen Ihnen kostenlos sprachliche Hilfsdienstleistungen zur [employer]. [place]: [phone] (TTY: [id number]).   (Arabic): [phone]   .            : .)[id number] :   (  ??? ([place]): ??????????????????????????????????[phone] ?TTY?711?????????????????  ? (Farsi): [phone]  . ?   ? ?  ? ? ?~ ?  ?    : .??  (TTY: 711)  Din Bizaad ([place]): D77 baa ak0  n7n7zin: D77 saad bee y1n7[ti'go Risa Grill, saad bee 1k1'1n7da'1wo'd66', t'11 Pricilla Loveless n1 h0l=, koj8' h0d77lnih [phone] (TTY:   [id number]).

## 2016-02-01 ENCOUNTER — Telehealth: Payer: Self-pay

## 2016-02-01 ENCOUNTER — Telehealth: Payer: Self-pay | Admitting: Family Medicine

## 2016-02-01 NOTE — Telephone Encounter (Signed)
[facility] care manager called updating that they are closing case with patient , patient is now working with [facility]

## 2016-02-01 NOTE — Telephone Encounter (Signed)
Call placed to [facility] # [phone] to check on the status of the PCS referral.  Spoke to Tammy who confirmed that the referral was received and and an appointment for an assessment has been scheduled for 02/07/16.  Call then placed to the patient and he stated that he was contacted and confirmed that his assessment is scheduled for 02/07/16. He said that he is doing " all right."  He noted that he is working on increasing his stamina by walking.  He also noted that he has all of his medications. No problems/concerns reported.

## 2016-02-02 ENCOUNTER — Other Ambulatory Visit: Payer: Self-pay | Admitting: *Deleted

## 2016-02-02 NOTE — Patient Outreach (Signed)
[facility]) Care Management  02/02/2016  ZEE VILL [birth year] [id number]   CSW made a third and final attempt to try and contact patient today to perform phone assessment, as well as assess and assist with social work needs and services, without success.  A HIPAA compliant message was left for patient on voicemail.  CSW continues to await a return call.  CSW will mail an outreach letter to patient's home, encouraging patient to contact CSW at his earliest convenience, if patient is interested in receiving social work services through [place] with [facility].  If CSW does not receive a return call from patient within the next 10 business days, CSW will proceed with case closure.  Required number of phone attempts will have been made and outreach letter mailed.    Nat Christen, BSW, MSW, LCSW  Licensed Education officer, environmental Health System  Mailing [place] [address] [place], [place] [address] Physical Address-[address], [place], [place] [address] Toll Free Main # [phone] Fax # [phone] Cell # [phone]  Fax # [phone]  Di [email or web address] complies with [employer] civil rights laws and does not discriminate on the basis of race, color, national origin, age, disability, or sex.  Espaol (Spanish)  [place] cumple con las leyes federales de derechos civiles aplicables y no discrimina por motivos de raza, color, nacionalidad, edad, discapacidad o sexo.     Ti?ng Vi?t ([place])  [place] tun th? lu?t dn quy?n hi?n hnh c?a Lin bang v khng phn bi?t ?i x? d?a trn ch?ng t?c, mu da, ngu?n g?c qu?c gia, ? tu?i, khuy?t t?t, ho?c gi?i tnh.     (Arabic)    [place] is Against the [employer]. and its subsidiaries comply with [employer] civil rights laws and do not discriminate on the basis of race, color, national origin, age, disability, or sex. [place] do not exclude people or treat them differently because of race, color, national origin, age, disability, or sex.    [employer]. and its subsidiaries provide:  . Free auxiliary aids and services, such as qualified sign language interpreters, video remote interpretation, and written information in other formats to people with disabilities when such auxiliary aids and services are necessary to ensure an equal opportunity to participate. . Free language services to people whose primary language is not English when those services are necessary to provide meaningful access, such as translated documents or oral interpretation.    If you need these services, call [phone] or if you use a TTY, call 711.   If you believe that Yahoo. and its subsidiaries have failed to provide these services or discriminated in another way on the basis of race, color, national origin, age, disability, or sex, you can file a Tourist information centre manager with:   Discrimination [place] [address]   If you need help filing a grievance, call [phone] or if you use a TTY, call 711.  You can also file a civil rights complaint with the U.S. Department of Health and Financial controller, Office for Civil Rights electronically through the Office for Civil Rights Complaint Portal, available at OnSiteLending.nl.jsf, or by mail or phone at:  U.S. Department of Health and Human Services  [address], [place] [phone], [facility] Building  [place], [place]  [phone], [phone] (TDD)  Complaint forms are available at [email or web address].si [place]: ATTENTION: If you do not speak English, language assistance services, free of charge, are available to you. Call [phone] (TTY: [id number]).  Espaol (Spanish): ATENCIN: si habla espaol, tiene a su disposicin servicios gratuitos de asistencia lingstica. Llame al [phone] (TTY: [id number]). ???? (Chinese): ?????????????????????????????? [phone]?TTY: 711??  Ti?ng Vi?t (Vietnamese): CH : N?u b?n ni Ti?ng Vi?t, c cc d?ch v? h? tr? ngn ng? mi?n ph dnh cho b?n. G?i s? [phone] (TTY: [id number]).  ??? ([place]): ?? : ???? ????? ?? , ?? ?? ???? ??? ???? ? ???? . [phone] (TTY: 711)??? ??? ???? .  Tagalog (Tagalog - Filipino): PAUNAWA: Kung nagsasalita ka ng Tagalog, maaari kang gumamit ng mga serbisyo ng tulong sa wika nang walang bayad. [place] sa [phone] (TTY: [id number]).   [place]): :      ,      .  [phone] (: [id number]).  Kreyl Ayisyen ([place]): ATANSYON: Si w pale Ethelene Hal, gen svis d pou lang ki disponib gratis pou ou. Rele [phone] (TTY: [id number]).  Fonnie Jarvis Marland KitchenPak[place]): ATTENTION : Si vous parlez franais, des services d'aide linguistique vous sont proposs gratuitement. Appelez le [phone] (ATS : [id number]).  Polski (Polish): UWAGA: Jeeli mwisz po polsku, moesz skorzysta z bezpatnej pomocy jzykowej. Zadzwo pod numer [phone] (TTY: [id number]).  Portug[place] ([place]): ATENO: Se fala portugus, encontram-se disponveis servios lingusticos, grtis. Ligue para [phone] (TTY: [id number]).  Italiano ([place]): ATTENZIONE: In caso la lingua parlata sia l'italiano, sono disponibili servizi di assistenza linguistica gratuiti. Chiamare il numero [phone] (TTY: [id number]).  Dawayne Patricia ([place]): ACHTUNG: Wenn Sie Deutsch sprechen,  stehen Ihnen kostenlos sprachliche Hilfsdienstleistungen zur [employer]. [place]:  [phone] (TTY: [id number]).   (Arabic): [phone]   .            : .)[id number] :   (  ??? ([place]): ??????????????????????????????????[phone] ?TTY?711?????????????????  ? (Farsi): [phone]  . ?   ? ?  ? ? ?~ ?  ?    : .??  (TTY: 711)  Din Bizaad ([place]): D77 baa ak0 n7n7zin: D77 saad bee y1n7[ti'go Risa Grill, saad bee 1k1'1n7da'1wo'd66', t'11 Pricilla Loveless n1 h0l=, koj8' h0d77lnih [phone] (TTY:   [id number]).

## 2016-02-04 DIAGNOSIS — K219 Gastro-esophageal reflux disease without esophagitis: Secondary | ICD-10-CM | POA: Diagnosis not present

## 2016-02-04 DIAGNOSIS — J181 Lobar pneumonia, unspecified organism: Secondary | ICD-10-CM | POA: Diagnosis not present

## 2016-02-04 DIAGNOSIS — J440 Chronic obstructive pulmonary disease with acute lower respiratory infection: Secondary | ICD-10-CM | POA: Diagnosis not present

## 2016-02-04 DIAGNOSIS — I482 Chronic atrial fibrillation: Secondary | ICD-10-CM | POA: Diagnosis not present

## 2016-02-04 DIAGNOSIS — D649 Anemia, unspecified: Secondary | ICD-10-CM | POA: Diagnosis not present

## 2016-02-04 DIAGNOSIS — M1990 Unspecified osteoarthritis, unspecified site: Secondary | ICD-10-CM | POA: Diagnosis not present

## 2016-02-04 DIAGNOSIS — I2510 Atherosclerotic heart disease of native coronary artery without angina pectoris: Secondary | ICD-10-CM | POA: Diagnosis not present

## 2016-02-04 DIAGNOSIS — I5032 Chronic diastolic (congestive) heart failure: Secondary | ICD-10-CM | POA: Diagnosis not present

## 2016-02-04 DIAGNOSIS — I110 Hypertensive heart disease with heart failure: Secondary | ICD-10-CM | POA: Diagnosis not present

## 2016-02-07 ENCOUNTER — Other Ambulatory Visit: Payer: Self-pay | Admitting: *Deleted

## 2016-02-07 ENCOUNTER — Encounter: Payer: Self-pay | Admitting: *Deleted

## 2016-02-07 MED FILL — VIT D2 1.25 MG (50,000 UNIT: 1.25 MG | 28 days supply | Qty: 4 | Fill #1

## 2016-02-07 NOTE — Patient Outreach (Addendum)
[facility]) Care Management   02/07/2016  [name] [name] 27-Oct-1951 [id number]  [name] [name] is an 65 y.o. male  Subjective: Patient states "doing well". He reports gradually regaining his energy and believes that he can continue to increase his stamina by walking.  Objective: BP 130/84 mmHg  Pulse 60  Resp 20  SpO2 98%  Review of Systems  Constitutional: Negative.   HENT: Negative.   Eyes: Negative.        Wears eyeglasses  Respiratory: Positive for shortness of breath. Negative for cough, sputum production and wheezing.        Shortness of breath with physical exertion  Cardiovascular: Positive for leg swelling. Negative for chest pain.       Regular rate and rhythm Non-pitting edema to lower extremities persist  Gastrointestinal: Negative.        Obese Abdomen soft and non-tender Bowel sounds present  Genitourinary: Positive for frequency.       Frequency related to Lasix per patient  Musculoskeletal: Negative.  Negative for falls.  Skin: Negative.        Dry, flaky skin to bilateral lower extremities  Neurological: Positive for seizures.  Endo/Heme/Allergies: Negative.   Psychiatric/Behavioral: Negative.     Physical Exam  Current Medications:   Current Outpatient Prescriptions  Medication Sig Dispense Refill  . albuterol (PROVENTIL HFA;VENTOLIN HFA) 108 (90 BASE) MCG/ACT inhaler Inhale 2 puffs into the lungs every 6 (six) hours as needed. 1 Inhaler 5  . amiodarone (PACERONE) 200 MG tablet Take 1 tablet (200 mg total) by mouth 2 (two) times daily. 60 tablet 2  . aspirin EC 81 MG tablet Take 81 mg by mouth daily.      Marland Kitchen atorvastatin (LIPITOR) 40 MG tablet Take 1 tablet (40 mg total) by mouth daily at 6 PM. 30 tablet 3  . clotrimazole-betamethasone (LOTRISONE) cream Apply 1 application topically 2 (two) times daily. (Patient taking differently: Apply 1 application topically 2 (two) times daily as needed (FOR RASH). ) 30 g 1  . colchicine 0.6 MG  tablet Take 1 tablet (0.6 mg total) by mouth 2 (two) times daily. 60 tablet 3  . digoxin (LANOXIN) 0.125 MG tablet Take 1 tablet (0.125 mg total) by mouth daily. 30 tablet 3  . ferrous sulfate 325 (65 FE) MG tablet Take 325 mg by mouth daily with breakfast. Reported on 01/06/2016    . furosemide (LASIX) 20 MG tablet Take 1 tablet (20 mg total) by mouth daily. 30 tablet 1  . levETIRAcetam (KEPPRA) 750 MG tablet Take 2 tablets (1,500 mg total) by mouth 2 (two) times daily. 120 tablet 11  . lisinopril (PRINIVIL,ZESTRIL) 20 MG tablet Take 20 mg by mouth daily.     . metoprolol succinate (TOPROL XL) 25 MG 24 hr tablet Take 1 tablet (25 mg total) by mouth daily. 30 tablet 3  . nitroGLYCERIN (NITROSTAT) 0.4 MG SL tablet Place 1 tablet (0.4 mg total) under the tongue every 5 (five) minutes as needed for chest pain. 25 tablet 1  . omeprazole (PRILOSEC) 40 MG capsule Take 1 capsule (40 mg total) by mouth 2 (two) times daily before a meal. 60 capsule 3  . Vitamin D, Ergocalciferol, (DRISDOL) 50000 units CAPS capsule Take 1 capsule (50,000 Units total) by mouth every Wednesday. 10 capsule 2  . albuterol (PROVENTIL) (2.5 MG/3ML) 0.083% nebulizer solution Take 3 mLs (2.5 mg total) by nebulization every 6 (six) hours as needed for wheezing or shortness of breath. (Patient not taking:  Reported on 02/07/2016) 150 mL 1  . fluticasone-salmeterol (ADVAIR HFA) 115-21 MCG/ACT inhaler Inhale 2 puffs into the lungs daily as needed. For shortness of breath (Patient not taking: Reported on 02/07/2016) 1 Inhaler 3   No current facility-administered medications for this visit.    Functional Status:   In your present state of health, do you have any difficulty performing the following activities: 02/07/2016 01/06/2016  Hearing? N N  Vision? N N  Difficulty concentrating or making decisions? N N  Walking or climbing stairs? N N  Dressing or bathing? N N  Doing errands, shopping? N N  Preparing Food and eating ? N N  Using the  Toilet? N N  In the past six months, have you accidently leaked urine? N N  Do you have problems with loss of bowel control? N N  Managing your Medications? N N  Managing your Finances? N N  Housekeeping or managing your Housekeeping? Y N    Fall/Depression Screening:    PHQ 2/9 Scores 02/07/2016 01/10/2016 01/06/2016 12/23/2015 02/27/2014  PHQ - 2 Score 0 0 0 0 0    Assessment:   65 year old male seen for atypical chest pain- ruled out MI, pneumonia- resolved and heart failure.  Arrived at patient's apartment. Noted dirt and dust all over the floor. 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 He denies problems with breathing at rest, coughing, mucus production and chest pain/ discomfort. He states that shortness of breath with exertion is "no more than usual". Patient's appetite is getting better as stated but still not like what he is accustomed to. He reports that everything tasted "bitter" before, but he can taste the food now. He reports taking Boost once a day for nutritional supplement.  Patient states that his weight dropped from to 238 to 235 pounds which is desirable for him.   He verbalized that he had been on "green zone" since the last visit. He states  today's weight is 235.3 pounds and assisted patient in documenting result on [facility] calendar/notebook provided. However, non- pitting edema remains on ankles,feet/ legs. Encouraged patient to elevate his legs as much as he can and keep legs raised higher than the heart when in bed. He continues to be on Lasix 20 mg daily.  Patient was also encouraged to moisturize his lower extremities since it is dry and flaky.   Patient reports non- use of  Albuterol inhaler and Advair puff since he did not feel the need for it.    Encouraged patient to call pharmacy to order his Vitamin D since he is running low. Patient was instructed to pick-up over the  counter Iron when picking up his Vitamin D refill. Patient agreed. Primary care provider follow-up visit will be on 2/28. He has a scheduled appointment to see gastroenterologist (Dr. Loletha Carrow) on 02/29/16 to follow-up anorexia and colonoscopy.  Appointment schedule to see Dr. Chase Caller on 4/5 for PFT (Pulmonary Function Test). Patient reports using [employer] transportation when going to his doctors' appointments. Patient states [facility], occupational therapy and nurse had completed working with him and signed off.  He reports that strength and stamina is gradually regained and believes that walking can increase his stamina. He mentions walking to and from the mailbox (about 2 blocks) needing a rest period when coming back, as stated.    [place] healthcare assessor came in today towards the end of the  visit to assess patient for personal care service needs.   Medication/ pill box provided to patient to help organize his medications. Patient was very thankful of it.  Patient expressed no other needs at present. He agreed to routine home visit next month. He is aware to call [facility], care management coordinator or 24-hour nurse line if needed. Patient has the contact informations.   Plan: Routine home visit on 03/16/16. Will follow-up on personal care service [facility]).    Avaley Coop A. Tonita Bills, BSN, RN-BC [place] Management Coordinator Cell: [phone]     Plan: Routine home visit on 03/16/16   [facility] CM Care Plan Problem One        Most Recent Value   Care Plan Problem One  at risk for readmission   Role Documenting the Problem One  Care Management [place] for Problem One  Active   THN Long Term Goal (31-90 days)  patient will not be readmitted in the next 60 days   THN Long Term Goal Start Date  01/06/16   Interventions for Problem One Long Term Goal  encourage adherence to medications,  assisted to call pharmacy for refill of Vitamin D and remind  to pick up over the counter Iron,  encourage to keep follow-up appointments with providers (new GI appointment 3/7),  instruct to evaluate self daily using  heart failure zone tool and call the doctor to get help when experiencing signs and symptoms under the yellow zone,  reinforce taking supplements like (Boost)   THN CM Short Term Goal #1 (0-30 days)  patient will identify the 3 heart failure zones and enumerate at least 3 signs and symptoms under each zone and when to call the doctor for help in the next 30 days    THN CM Short Term Goal #1 Start Date  01/06/16   [facility] CM Short Term Goal #1 Met Date  02/07/16 [Goal met.]   THN CM Short Term Goal #2 (0-30 days)  Patient will verbalize the 5 steps of managing heart failure or living better with heart failure in the next 30 days    THN CM Short Term Goal #2 Start Date  01/06/16   [facility] CM Short Term Goal #2 Met Date  02/07/16 [Goal met.]   THN CM Short Term Goal #3 (0-30 days)  patient will adhere to medications as ordered such as getting Keppra refilled and picking-up over-the counter Iron medication within a week   THN CM Short Term Goal #3 Start Date  01/06/16   [facility] CM Short Term Goal #3 Met Date  01/17/16 [Goal met-reports having started Keppra, Iron refills & Lasix]   THN CM Short Term Goal #4 (0-30 days)  patient will continue to weigh self and record in the next 30 days   THN CM Short Term Goal #4 Start Date  02/07/16   Interventions for Short Term Goal #4  remind patient to weigh self daily and record using [facility] calendar/ notebook provided,  teach patient the proper way of monitoring weight- check weight in the morning after using the bathroom, before getting dressed and before eating breakfast    THN CM Care Plan Problem Two        Most Recent Value   Care Plan Problem Two  patient expresses easy fatigability and weak stamina   Role Documenting the Problem Two  Care Management [place] for Problem Two  Active   Interventions  for Problem Two  Long Term Goal   check with patient on how far he has been able to tolerate walking,  encourage to get active and do exercises like walking and home exercises given by physical therapy,  reinforce to start slowly to build endurance then increase gradually as tolerated,  provide positive feedback for efforts,  remind to take rest periods to avoid shortness of breath and fatigue,  encourage to eat healthy and nutritious food to help regain his energy,  encourage to take nutritional supplement (Boost)   THN Long Term Goal (31-90) days  patient will report being able to tolerate walking at least 15 minutes daily as part of his exercise in the next 60 days   THN Long Term Goal Start Date  01/06/16   THN CM Short Term Goal #1 (0-30 days)  patient will report being able to walk to his mailbox with lesser rest periods in the next 30 days    THN CM Short Term Goal #1 Start Date  01/06/16   [facility] CM Short Term Goal #1 Met Date   02/07/16 [Goal met- takes 1 rest period coming back from mailbox]      Verline Kong A. Cashay Manganelli, BSN, RN-BC [place] Management Coordinator Cell: [phone]

## 2016-02-09 MED FILL — ADVAIR 250/50 DISKUS: 250-50 | 30 days supply | Qty: 60 | Fill #0

## 2016-02-09 MED FILL — FUROSEMIDE 40 MG TABLET: 40 | 30 days supply | Qty: 30 | Fill #0

## 2016-02-09 MED FILL — VENTOLIN HFA 90 MCG INHALER: 108 (90 BAS | 25 days supply | Qty: 18 | Fill #0

## 2016-02-10 DIAGNOSIS — I5040 Unspecified combined systolic (congestive) and diastolic (congestive) heart failure: Secondary | ICD-10-CM | POA: Diagnosis not present

## 2016-02-10 DIAGNOSIS — G4733 Obstructive sleep apnea (adult) (pediatric): Secondary | ICD-10-CM | POA: Diagnosis not present

## 2016-02-10 DIAGNOSIS — J449 Chronic obstructive pulmonary disease, unspecified: Secondary | ICD-10-CM | POA: Diagnosis not present

## 2016-02-15 ENCOUNTER — Encounter: Payer: Self-pay | Admitting: *Deleted

## 2016-02-15 ENCOUNTER — Other Ambulatory Visit: Payer: Self-pay | Admitting: *Deleted

## 2016-02-15 NOTE — Patient Outreach (Signed)
[facility]) Care Management  02/15/2016  [name] [name] [birth year] [id number]  CSW was able to make initial contact with patient today to perform phone assessment, as well as assess and assist with social work needs and services.  CSW introduced self, explained role and types of services provided through [place] [facility] ([place] [facility]).  CSW further explained to patient that CSW works with patient's RNCM, also with [place] Management, [name] [name]. CSW then explained the reason for the call, indicating that Mrs. Ajel thought that patient would benefit from social work services and resources to assist with arranging [facility] ([place]) for patient in the home to assist with activities of daily living.  CSW obtained two HIPAA compliant identifiers from patient, which included patient's name and date of birth. Patient admitted that he received his acceptance letter for PCS through [facility], just yesterday.  According to patient's letter, patient has been encouraged to decide on a home health agency of choice, and report these findings to [facility], as soon as possible, so that appropriate home care arrangements can be made on patient's behalf.  CSW spoke with patient at length about the various home health agencies contracted through [facility], providing a brief description of available services offered with each agency.  Patient admitted that he has heard good things about [facility] [employer], deciding to give this agency an opportunity to provide for his home care needs.  No additional social work needs identified at this time. CSW will perform a case closure on patient, as all goals of treatment have been met from social work standpoint and no additional social work needs have been identified at this time. CSW will notify patient's RNCM with [place] Management, [name] [name] of  CSW's plans to close patient's case. CSW will fax a correspondence letter to patient's Primary Care Physician, Dr. Arnoldo Morale to ensure that Dr. Jarold Song is aware of CSW's case closure plans.   CSW will submit a case closure request to [name] [name], Care Management Assistant with [place] [facility], in the form of an [employer].  CSW will ensure that Mrs. Yale is aware of Rod Can, RNCM with [place] [facility], continued involvement with patient's care.  Nat Christen, BSW, MSW, LCSW  Licensed Education officer, environmental Health System  Mailing [place] [address], [place], [place] [address] Physical Address-[address], [place], [place] [address] Toll Free Main # [phone] Fax # [phone] Cell # [phone]  Fax # [phone]  [email or web address]  [employer]  Discrimination is Against the [employer]. and its subsidiaries comply with applicable Federal civil rights laws and do not discriminate on the basis of race, color, national origin, age, disability, or sex. [place] do not exclude people or treat them differently because of race, color, national origin, age, disability, or sex.    [employer]. and its subsidiaries provide:  . Free auxiliary aids and services, such as qualified sign language interpreters, video remote interpretation, and written information in other formats to people with disabilities when such auxiliary aids and services are necessary to ensure an equal opportunity to participate. . Free language services to people whose primary language is not English when those services are necessary to provide meaningful access, such as translated documents or oral interpretation.    If you need these services, call  [phone] or if you use a TTY, call 711.   If you believe that [employer]. and its subsidiaries have failed to provide  these services or discriminated in another way on the basis of race, color, national origin, age, disability, or sex, you can file a Tourist information centre manager with:   Discrimination [place] [address]   If you need help filing a grievance, call [phone] or if you use a TTY, call 711.  You can also file a civil rights complaint with the U.S. Department of Health and Financial controller, Office for Civil Rights electronically through the Office for Civil Rights Complaint Portal, available at OnSiteLending.nl.jsf, or by mail or phone at:   [place]. Department of Health and Human Services  [place], [place], [facility]  [place], [place]  [phone], [phone] (TDD)  Complaint forms are available at [email or web address] [place]: ATTENTION: If you do not speak English, language assistance services, free of charge, are available to you. Call [phone] (TTY: 505).  Espaol (Spanish): ATENCIN: si habla espaol, tiene a su disposicin servicios gratuitos de asistencia lingstica. Llame al [phone] (TTY: 374). ???? (Chinese): ?????????????????????????????? [phone]?TTY: 711??  Ti?ng Vi?t (Vietnamese): CH : N?u b?n ni Ti?ng Vi?t, c cc d?ch v? h? tr? ngn ng? mi?n ph dnh cho b?n. G?i s? [phone] (TTY: 758).  ??? ([place]): ?? : ???? ????? ?? , ?? ?? ???? ??? ???? ? ???? . [phone] (TTY: 711)??? ??? ???? .  Tagalog (Tagalog - Filipino): PAUNAWA: Kung nagsasalita ka ng Tagalog, maaari kang gumamit ng mga serbisyo ng tulong sa wika nang walang bayad. [place] sa [phone] (TTY: 881).   [place]): :      ,      .  [phone] (: 292).  Kreyl Ayisyen ([place]): ATANSYON: Si w pale Ethelene Hal, gen svis d pou  lang ki disponib gratis pou ou. Rele [phone] (TTY: 116).  Fonnie Jarvis [place] Kitchen[place]): ATTENTION : Si vous parlez franais, des services d'aide linguistique vous sont proposs gratuitement. Appelez le [phone] (ATS : 291).  Polski (Polish): UWAGA: Jeeli mwisz po polsku, moesz skorzysta z bezpatnej pomocy jzykowej. Zadzwo pod numer [phone] (TTY: 977).  Portugus ([place]): ATENO: Se fala portugus, encontram-se disponveis servios lingusticos, grtis. Ligue para [phone] (TTY: 334).  Italiano ([place]): ATTENZIONE: In caso la lingua parlata sia l'italiano, sono disponibili servizi di assistenza linguistica gratuiti. Chiamare il numero [phone] (TTY: 902).  Dawayne Patricia ([place]orea): ACHTUNG: Wenn Sie Deutsch sprechen,  stehen Ihnen kostenlos sprachliche Hilfsdienstleistungen zur [employer]. Rufnummer: [phone] (TTY: 336).   (Arabic): [phone]   .            : .)511 :   (  ??? ([place]): ??????????????????????????????????[phone] ?TTY?711?????????????????  ? (Farsi): [phone]  . ?   ? ?  ? ? ?~ ?  ?    : .??  (TTY: 711)  Din Bizaad ([place]): D77 baa ak0 n7n7zin: D77 saad bee y1n7[ti'go Risa Grill, saad bee [id number]'d66', t'11 Pricilla Loveless n1 h0l=, koj8' [id number] [phone] (TTY:   060).

## 2016-02-16 ENCOUNTER — Ambulatory Visit: Payer: Self-pay | Admitting: *Deleted

## 2016-02-22 ENCOUNTER — Encounter: Payer: Self-pay | Admitting: Family Medicine

## 2016-02-22 ENCOUNTER — Ambulatory Visit: Payer: Commercial Managed Care - HMO | Attending: Family Medicine | Admitting: Family Medicine

## 2016-02-22 VITALS — BP 148/84 | HR 60 | Temp 97.8°F | Resp 15 | Ht 67.5 in | Wt 240.4 lb

## 2016-02-22 DIAGNOSIS — R399 Unspecified symptoms and signs involving the genitourinary system: Secondary | ICD-10-CM

## 2016-02-22 DIAGNOSIS — I5030 Unspecified diastolic (congestive) heart failure: Secondary | ICD-10-CM | POA: Insufficient documentation

## 2016-02-22 DIAGNOSIS — J4530 Mild persistent asthma, uncomplicated: Secondary | ICD-10-CM

## 2016-02-22 DIAGNOSIS — Z79899 Other long term (current) drug therapy: Secondary | ICD-10-CM | POA: Diagnosis not present

## 2016-02-22 DIAGNOSIS — J449 Chronic obstructive pulmonary disease, unspecified: Secondary | ICD-10-CM | POA: Diagnosis not present

## 2016-02-22 DIAGNOSIS — I10 Essential (primary) hypertension: Secondary | ICD-10-CM | POA: Diagnosis not present

## 2016-02-22 DIAGNOSIS — I480 Paroxysmal atrial fibrillation: Secondary | ICD-10-CM | POA: Diagnosis not present

## 2016-02-22 DIAGNOSIS — I4891 Unspecified atrial fibrillation: Secondary | ICD-10-CM | POA: Diagnosis not present

## 2016-02-22 DIAGNOSIS — J438 Other emphysema: Secondary | ICD-10-CM | POA: Diagnosis not present

## 2016-02-22 DIAGNOSIS — J439 Emphysema, unspecified: Secondary | ICD-10-CM | POA: Insufficient documentation

## 2016-02-22 DIAGNOSIS — R3989 Other symptoms and signs involving the genitourinary system: Secondary | ICD-10-CM | POA: Diagnosis not present

## 2016-02-22 DIAGNOSIS — I4892 Unspecified atrial flutter: Secondary | ICD-10-CM | POA: Diagnosis not present

## 2016-02-22 DIAGNOSIS — I509 Heart failure, unspecified: Secondary | ICD-10-CM | POA: Diagnosis not present

## 2016-02-22 DIAGNOSIS — Z125 Encounter for screening for malignant neoplasm of prostate: Secondary | ICD-10-CM | POA: Diagnosis not present

## 2016-02-22 MED ORDER — TAMSULOSIN HCL 0.4 MG PO CAPS: 0.4000 mg | ORAL_CAPSULE | Freq: Every day | ORAL | Status: DC

## 2016-02-22 MED FILL — TAMSULOSIN HCL 0.4 MG CAP: 0.4 | 30 days supply | Qty: 30 | Fill #0

## 2016-02-22 NOTE — Patient Instructions (Signed)
Benign Prostatic Hyperplasia An enlarged prostate (benign prostatic hyperplasia) is common in older men. You may experience the following:  Weak urine stream.  Dribbling.  Feeling like the bladder has not emptied completely.  Difficulty starting urination.  Getting up frequently at night to urinate.  Urinating more frequently during the day. HOME CARE INSTRUCTIONS  Monitor your prostatic hyperplasia for any changes. The following actions may help to alleviate any discomfort you are experiencing:  Give yourself time when you urinate.  Stay away from alcohol.  Avoid beverages containing caffeine, such as coffee, tea, and colas, because they can make the problem worse.  Avoid decongestants, antihistamines, and some prescription medicines that can make the problem worse.  Follow up with your health care provider for further treatment as recommended. SEEK MEDICAL CARE IF:  You are experiencing progressive difficulty voiding.  Your urine stream is progressively getting narrower.  You are awaking from sleep with the urge to void more frequently.  You are constantly feeling the need to void.  You experience loss of urine, especially in small amounts. SEEK IMMEDIATE MEDICAL CARE IF:   You develop increased pain with urination or are unable to urinate.  You develop severe abdominal pain, vomiting, a high fever, or fainting.  You develop back pain or blood in your urine. MAKE SURE YOU:   Understand these instructions.  Will watch your condition.  Will get help right away if you are not doing well or get worse.   This information is not intended to replace advice given to you by your health care provider. Make sure you discuss any questions you have with your health care provider.   Document Released: 12/11/2005 Document Revised: 01/01/2015 Document Reviewed: 05/13/2013 Elsevier Interactive Patient Education 2016 Elsevier Inc.  

## 2016-02-22 NOTE — Progress Notes (Signed)
Patient complains urinary urgency and frequency He states it is embarrassing No pain of discomfort while urinating

## 2016-02-22 NOTE — Progress Notes (Signed)
Subjective:  Patient ID: [name] [name], male    DOB: 1951/06/06  Age: 65 y.o. MRN: [id number]  CC: Follow-up   HPI DMITRIUS [name] is a 65 year old male with a history of paroxysmal A. fib, atrial flutter, diastolic congestive heart failure (EF 50-55% in 11/2015), CAD (status post cardiac cath in 11/2015),  COPD, GERD who returns today for a follow-up visit. He did have some pleural effusion and pericarditis which have resolved with colchicine and steroids as evident on most recent chest x-ray from 01/24/16  Since his last visit he has been to see about pulmonology and has a PFT scheduled; he has also been to see his cardiologist-Dr Terrence Dupont with no change in his medication regimen. He has an upcoming appointment with GI for evaluation of anorexia and also for colonoscopy.  Today he states his appetite is improving; he still continues to be short of breath when he exerts himself and so takes it slowly but states this is his baseline. He complains of urinary urgency, urinary frequency and hesitancy and has had accidents and himself on a number of occassions.  Outpatient Prescriptions Prior to Visit  Medication Sig Dispense Refill  . albuterol (PROVENTIL HFA;VENTOLIN HFA) 108 (90 BASE) MCG/ACT inhaler Inhale 2 puffs into the lungs every 6 (six) hours as needed. 1 Inhaler 5  . albuterol (PROVENTIL) (2.5 MG/3ML) 0.083% nebulizer solution Take 3 mLs (2.5 mg total) by nebulization every 6 (six) hours as needed for wheezing or shortness of breath. 150 mL 1  . amiodarone (PACERONE) 200 MG tablet Take 1 tablet (200 mg total) by mouth 2 (two) times daily. 60 tablet 2  . aspirin EC 81 MG tablet Take 81 mg by mouth daily.      Marland Kitchen atorvastatin (LIPITOR) 40 MG tablet Take 1 tablet (40 mg total) by mouth daily at 6 PM. 30 tablet 3  . clotrimazole-betamethasone (LOTRISONE) cream Apply 1 application topically 2 (two) times daily. (Patient taking differently: Apply 1 application topically 2 (two) times daily as  needed (FOR RASH). ) 30 g 1  . colchicine 0.6 MG tablet Take 1 tablet (0.6 mg total) by mouth 2 (two) times daily. 60 tablet 3  . digoxin (LANOXIN) 0.125 MG tablet Take 1 tablet (0.125 mg total) by mouth daily. 30 tablet 3  . ferrous sulfate 325 (65 FE) MG tablet Take 325 mg by mouth daily with breakfast. Reported on 01/06/2016    . fluticasone-salmeterol (ADVAIR HFA) 115-21 MCG/ACT inhaler Inhale 2 puffs into the lungs daily as needed. For shortness of breath 1 Inhaler 3  . furosemide (LASIX) 20 MG tablet Take 1 tablet (20 mg total) by mouth daily. 30 tablet 1  . levETIRAcetam (KEPPRA) 750 MG tablet Take 2 tablets (1,500 mg total) by mouth 2 (two) times daily. 120 tablet 11  . lisinopril (PRINIVIL,ZESTRIL) 20 MG tablet Take 20 mg by mouth daily.     . metoprolol succinate (TOPROL XL) 25 MG 24 hr tablet Take 1 tablet (25 mg total) by mouth daily. 30 tablet 3  . nitroGLYCERIN (NITROSTAT) 0.4 MG SL tablet Place 1 tablet (0.4 mg total) under the tongue every 5 (five) minutes as needed for chest pain. 25 tablet 1  . omeprazole (PRILOSEC) 40 MG capsule Take 1 capsule (40 mg total) by mouth 2 (two) times daily before a meal. 60 capsule 3  . Vitamin D, Ergocalciferol, (DRISDOL) 50000 units CAPS capsule Take 1 capsule (50,000 Units total) by mouth every Wednesday. 10 capsule 2   No facility-administered  medications prior to visit.    ROS Review of Systems  Constitutional: Positive for appetite change. Negative for activity change.  HENT: Negative for sinus pressure and sore throat.   Eyes: Negative for visual disturbance.  Respiratory: Positive for shortness of breath (on mild exertion which is his baseline). Negative for cough and chest tightness.   Cardiovascular: Negative for chest pain and leg swelling.  Gastrointestinal: Negative for abdominal pain, diarrhea, constipation and abdominal distention.  Endocrine: Negative.   Genitourinary:       See history of present illness  Musculoskeletal:  Negative for myalgias and joint swelling.  Skin: Negative for rash.  Allergic/Immunologic: Negative.   Neurological: Negative for weakness, light-headedness and numbness.  Psychiatric/Behavioral: Negative for suicidal ideas and dysphoric mood.    Objective:  BP 148/84 mmHg  Pulse 60  Temp(Src) 97.8 F (36.6 C)  Resp 15  Ht 5' 7.5" (1.715 m)  Wt 240 lb 6.4 oz (109.045 kg)  BMI 37.07 kg/m2  SpO2 100%  BP/Weight 02/22/2016 02/07/2016 [id number]  Systolic BP 123456 [id number]  Diastolic BP 84 84 84  Wt. (Lbs) 240.4 - 238  BMI 37.07 - 37.27      Physical Exam  Constitutional: He is oriented to person, place, and time. He appears well-developed and well-nourished.  Obese  Cardiovascular: Normal rate, normal heart sounds and intact distal pulses.   No murmur heard. Pulmonary/Chest: Effort normal and breath sounds normal. He has no wheezes. He has no rales. He exhibits no tenderness.  Abdominal: Soft. Bowel sounds are normal. He exhibits no distension and no mass. There is no tenderness.  Musculoskeletal: Normal range of motion.  Neurological: He is alert and oriented to person, place, and time.     Assessment & Plan:   1. Essential hypertension, benign Blood pressure is mildly elevated above goal of less than 140/90 No regimen changes at this time.  Low-sodium DASH diet  2. Atrial fibrillation, unspecified type ([place]) Currently in sinus rhythm.   3. Asthma, mild persistent, uncomplicated On rhythm control with amiodarone and rate control with metoprolol. Managed by cardiology-Dr.Harwani  4. Other emphysema ([place]) No acute exacerbations Scheduled for PFT Has upcoming appointment with [facility]  5. Diastolic heart failure EF of 50-55% from 2-D echo of 11/2015. No evidence of fluid overload. Recent chest x-ray result revealed resolved pleuropericarditis - Digoxin level  6. Lower urinary tract symptoms We'll treat for lower urinary tract symptoms and if symptoms  persist he will have to be evaluated for incontinent - tamsulosin (FLOMAX) 0.4 MG CAPS capsule; Take 1 capsule (0.4 mg total) by mouth daily.  Dispense: 30 capsule; Refill: 3 - PSA, Medicare   Meds ordered this encounter  Medications  . tamsulosin (FLOMAX) 0.4 MG CAPS capsule    Sig: Take 1 capsule (0.4 mg total) by mouth daily.    Dispense:  30 capsule    Refill:  3     Follow-up: Return in about 1 month (around 03/21/2016) for Follow-up of lower urinary tract symptoms.    This note has been created with Surveyor, quantity. Any transcriptional errors are unintentional.    Arnoldo Morale MD

## 2016-02-23 ENCOUNTER — Telehealth: Payer: Self-pay | Admitting: *Deleted

## 2016-02-23 LAB — PSA, MEDICARE: PSA: 0.83 ng/mL (ref <=4.00)

## 2016-02-23 LAB — DIGOXIN LEVEL: Digoxin Level: <0.5 ug/L — ABNORMAL LOW (ref 0.8–2.0)

## 2016-02-23 NOTE — Telephone Encounter (Signed)
Verified name and date of birth and gave results 

## 2016-02-23 NOTE — Telephone Encounter (Signed)
-----   Message from Arnoldo Morale, MD sent at 02/23/2016  8:43 AM EST ----- Prostate-specific antigen is normal, his digoxin level is bit low- his digoxin dose should be adjusted by his cardiologist.

## 2016-02-24 DIAGNOSIS — Z0100 Encounter for examination of eyes and vision without abnormal findings: Secondary | ICD-10-CM | POA: Diagnosis not present

## 2016-02-29 ENCOUNTER — Ambulatory Visit: Payer: Self-pay | Admitting: Gastroenterology

## 2016-03-08 ENCOUNTER — Encounter: Payer: Self-pay | Admitting: Nurse Practitioner

## 2016-03-08 ENCOUNTER — Ambulatory Visit (INDEPENDENT_AMBULATORY_CARE_PROVIDER_SITE_OTHER): Payer: Commercial Managed Care - HMO | Admitting: Nurse Practitioner

## 2016-03-08 VITALS — BP 126/84 | HR 79 | Ht 67.0 in | Wt 241.0 lb

## 2016-03-08 DIAGNOSIS — R259 Unspecified abnormal involuntary movements: Secondary | ICD-10-CM | POA: Insufficient documentation

## 2016-03-08 DIAGNOSIS — R569 Unspecified convulsions: Secondary | ICD-10-CM | POA: Diagnosis not present

## 2016-03-08 NOTE — Patient Instructions (Signed)
Will schedule sleep deprived EEG Continue Keppra at current dose for now Follow-up in 3 months

## 2016-03-08 NOTE — Progress Notes (Signed)
[facility]  PATIENT: [name] [name] DOB: 1951/12/19   REASON FOR VISIT: Follow-up for seizure-like activity, essential hypertension HISTORY FROM: Patient    HISTORY OF PRESENT ILLNESS: HISTORYYYPatient is a 65 years old male, referred by his primary care physician Dr. Annitta Needs for evaluation of recurrent right arm shaking He has past medical history of hypertension, paroxysmal A. fib asthma, GERD , hypogonadism Since 2012 he began to have stereotypical episodes of sudden onset involuntary right arm large amplitude shaking, with no right face, leg involvement, no loss of consciousness, lasting 2-3 minutes, it happened, average couple times each week, no generalized seizure  He reported that his brother, and father had similar event, was diagnosed with epilepsy Over the years, he has gradually developed feminine features, including gynecomastia, voice change, erectile dysfunction, this is under evaluation of his endocrinologist. EEG was normal MRI brain Oct 2015 there was evidence of multiple periventricular white matter disease, most likely small vessel disease  UPDATE Dec 17th 2015:  I have started Keppra 500 mg twice a day since October 2015, he tolerated the medication very well, there was significant less episodes, most recent December 09 2014, sudden onset uncontrollable large amplitude forceful right arm shaking, no loss of consciousness, lasting for 1 minutes, Laboratory showed elevated ESR 57, positive ANA, double-stranded DNA, normal Y24, folic acid, negative RPR, HIV.  UPDATE April 7th 2016:He still has recurrent episode about once a week, Last one was in April 3rd 2016, suddenly, he had right arm shaking, uncontrollable, it stopped in 2 minutes. NO LOC He is now taking Keppra 1017m bid, He reported, he had much less episode while taking Keppra  UPDATE Sep 09 2015:He is doing very well, since his Keppra dosage was increased to 750 mg 2 tablets twice a day,  he only had one recurrent right arm jerking movement, lasting for a few minutes, no loss of consciousness, but he does has mild confusion during episode, does not involving his right face or right leg.  UPDATE 03/08/2016 [name] [name] 65year old male returns for follow-up. He continues to have stereotypical episodes of sudden onset involuntary right arm large amplitude shaking, with no right face, leg involvement, no loss of consciousness, lasting 2-3 minutes, it happened, average couple times each week, no generalized seizure. He is currently taking Keppra 1500 twice daily. A sleep deprived EEG was ordered in April of last year but this was never scheduled. Blood pressure is well controlled in the office today. MRI of the brain in 2015 without acute findings. He was admitted to the hospital back in December  for pneumonia. He still says he is weak from this. He returns for reevaluation.   REVIEW OF SYSTEMS: Full 14 system review of systems performed and notable only for those listed, all others are neg:  Constitutional: Fatigue  Cardiovascular: neg Ear/Nose/Throat: neg  Skin: neg Eyes: neg Respiratory: Shortness of breath Gastroitestinal: neg  Hematology/Lymphatic: Anemia Endocrine: neg Musculoskeletal:neg Allergy/Immunology: neg Neurological: Tremors Psychiatric: neg Sleep : neg   ALLERGIES: No Known Allergies  HOME MEDICATIONS: Outpatient Prescriptions Prior to Visit  Medication Sig Dispense Refill  . albuterol (PROVENTIL HFA;VENTOLIN HFA) 108 (90 BASE) MCG/ACT inhaler Inhale 2 puffs into the lungs every 6 (six) hours as needed. 1 Inhaler 5  . albuterol (PROVENTIL) (2.5 MG/3ML) 0.083% nebulizer solution Take 3 mLs (2.5 mg total) by nebulization every 6 (six) hours as needed for wheezing or shortness of breath. 150 mL 1  . amiodarone (PACERONE) 200 MG tablet Take 1 tablet (  200 mg total) by mouth 2 (two) times daily. 60 tablet 2  . aspirin EC 81 MG tablet Take 81 mg by mouth daily.        Marland Kitchen atorvastatin (LIPITOR) 40 MG tablet Take 1 tablet (40 mg total) by mouth daily at 6 PM. 30 tablet 3  . clotrimazole-betamethasone (LOTRISONE) cream Apply 1 application topically 2 (two) times daily. (Patient taking differently: Apply 1 application topically 2 (two) times daily as needed (FOR RASH). ) 30 g 1  . colchicine 0.6 MG tablet Take 1 tablet (0.6 mg total) by mouth 2 (two) times daily. 60 tablet 3  . digoxin (LANOXIN) 0.125 MG tablet Take 1 tablet (0.125 mg total) by mouth daily. 30 tablet 3  . ferrous sulfate 325 (65 FE) MG tablet Take 325 mg by mouth daily with breakfast. Reported on 01/06/2016    . fluticasone-salmeterol (ADVAIR HFA) 115-21 MCG/ACT inhaler Inhale 2 puffs into the lungs daily as needed. For shortness of breath 1 Inhaler 3  . levETIRAcetam (KEPPRA) 750 MG tablet Take 2 tablets (1,500 mg total) by mouth 2 (two) times daily. 120 tablet 11  . lisinopril (PRINIVIL,ZESTRIL) 20 MG tablet Take 20 mg by mouth daily.     . metoprolol succinate (TOPROL XL) 25 MG 24 hr tablet Take 1 tablet (25 mg total) by mouth daily. 30 tablet 3  . nitroGLYCERIN (NITROSTAT) 0.4 MG SL tablet Place 1 tablet (0.4 mg total) under the tongue every 5 (five) minutes as needed for chest pain. 25 tablet 1  . omeprazole (PRILOSEC) 40 MG capsule Take 1 capsule (40 mg total) by mouth 2 (two) times daily before a meal. 60 capsule 3  . tamsulosin (FLOMAX) 0.4 MG CAPS capsule Take 1 capsule (0.4 mg total) by mouth daily. 30 capsule 3  . Vitamin D, Ergocalciferol, (DRISDOL) 50000 units CAPS capsule Take 1 capsule (50,000 Units total) by mouth every Wednesday. 10 capsule 2  . furosemide (LASIX) 20 MG tablet Take 1 tablet (20 mg total) by mouth daily. (Patient not taking: Reported on 03/08/2016) 30 tablet 1   No facility-administered medications prior to visit.    PAST MEDICAL HISTORY: Past Medical History  Diagnosis Date  . Hypertension   . Asthma   . Atrial fibrillation ([place])   . CHF (congestive heart  failure) ([place])   . Anemia   . Heart disease   . COPD (chronic obstructive pulmonary disease) ([place])     PAST SURGICAL HISTORY: Past Surgical History  Procedure Laterality Date  . Esophagus surgery    . Cardiac catheterization N/A 12/08/2015    Procedure: Left Heart Cath and Coronary Angiography;  Surgeon: Charolette Forward, MD;  Location: [facility];  Service: Cardiovascular;  Laterality: N/A;    FAMILY HISTORY: Family History  Problem Relation Age of Onset  . Cancer Mother     SOCIAL HISTORY: Social History   Social History  . Marital Status: Single    Spouse Name: N/A  . Number of Children: 1  . Years of Education: college   Occupational History  . [employer]    Social History Main Topics  . Smoking status: Never Smoker   . Smokeless tobacco: Never Used  . Alcohol Use: No  . Drug Use: No  . Sexual Activity: Not Currently   Other Topics Concern  . Not on file   Social History Narrative   Patient lives at home alone and he is single.   Patient is semi retired.    Education college.  Right handed.   Caffeine two cokes daily.     PHYSICAL EXAM  Filed Vitals:   03/08/16 1041  Height: '5\' 7"'  (1.702 m)  Weight: 241 lb (109.317 kg)   Body mass index is 37.74 kg/(m^2). Gen: NAD, conversant, well nourised, obese, well groomed  Cardiovascular: Regular rate rhythm,  Neck: Supple, no carotid bruit. Pulmonary: Clear to auscultation bilateral,   NEUROLOGICAL EXAM:  MENTAL STATUS: Speech:Speech is normal; fluent and spontaneous with normal comprehension.  Cognition:The patient is oriented to person, place, and time; recent and remote memory intact;   language fluent;  normal attention, concentration, fund of knowledge. CRANIAL NERVES: CN II: Visual fields are full to confrontation. Fundoscopic exam is normal with sharp discs and no vascular changes.  Pupils are  briskly reactive to light.. CN III, IV, VI: extraocular movement are  normal. No ptosis. CN V: Facial sensation is intact to pinprick in all 3 divisions bilaterally.   CN VII: Face is symmetric with normal eye closure and smile. CN VIII: Hearing is normal to rubbing fingers CN IX, X: Palate elevates symmetrically. Phonation is normal. CN XI: Head turning and shoulder shrug are intact CN XII: Tongue is midline with normal movements and no atrophy.  MOTOR:There is no pronator drift of out-stretched arms. Muscle bulk and tone are normal. Muscle strength is normal. REFLEXES:Reflexes are 2+ and symmetric at the biceps, triceps, knees, and ankles. Plantar responses are flexor. SENSORY:Light touch, pinprick, position sense, and vibration sense are intact in fingers and toes. COORDINATION:Rapid alternating movements and fine finger movements are intact. There is no dysmetria on finger-to-nose and heel-knee-shin.  GAIT/STANCE: Gait is steady with normal steps, base, arm swing, and turning. Heel and toe walking are normal. Tandem gait is normal. No assistive device Romberg is absent.     DIAGNOSTIC DATA (LABS, IMAGING, TESTING) - I reviewed patient records, labs, notes, testing and imaging myself where available.  Lab Results  Component Value Date   WBC 7.8 01/23/2016   HGB 10.1* 01/23/2016   HCT 33.5* 01/23/2016   MCV 66.7* 01/23/2016   PLT 454* 01/23/2016      Component Value Date/Time   NA 142 01/23/2016 1515   K 4.2 01/23/2016 1515   CL 105 01/23/2016 1515   CO2 29 01/23/2016 1515   GLUCOSE 103* 01/23/2016 1515   BUN 7 01/23/2016 1515   CREATININE 1.21 01/23/2016 1515   CREATININE 1.06 03/12/2015 1015   CALCIUM 8.8* 01/23/2016 1515   PROT 7.1 12/15/2015 1125   ALBUMIN 1.8* 12/15/2015 1125   AST 62* 12/15/2015 1125   ALT 54 12/15/2015 1125   ALKPHOS 182* 12/15/2015 1125   BILITOT 0.5 12/15/2015 1125   GFRNONAA >60 01/23/2016 1515   GFRNONAA 73 03/12/2015 1015   GFRAA >60 01/23/2016 1515   GFRAA 84 03/12/2015 1015   Lab Results  Component  Value Date   CHOL 144 12/09/2015   HDL 57 12/09/2015   LDLCALC 76 12/09/2015   TRIG 53 12/09/2015   CHOLHDL 2.5 12/09/2015   Lab Results  Component Value Date   HGBA1C 5.8* 12/08/2015    Lab Results  Component Value Date   TSH 1.167 01/23/2016      ASSESSMENT AND PLAN  65 y.o. year old male  has a past medical history of  recurrent episode of uncontrollable right arm shaking, suggestive of simple partial seizure, responded to Keppra , significant decrease of recurrent episodes on titrating dose of Keppra. Sleep deprived EEG has not been done. Reviewed records  Will schedule sleep deprived EEG Additional questions answered about sleep deprived EEG Continue Keppra at current dose for now Follow-up in 3 months Dennie Bible, [facility], [facility], [facility] [address] [place], [place] [address] [phone]

## 2016-03-08 NOTE — Progress Notes (Signed)
I have reviewed and agreed above plan. 

## 2016-03-09 DIAGNOSIS — I5040 Unspecified combined systolic (congestive) and diastolic (congestive) heart failure: Secondary | ICD-10-CM | POA: Diagnosis not present

## 2016-03-09 DIAGNOSIS — J449 Chronic obstructive pulmonary disease, unspecified: Secondary | ICD-10-CM | POA: Diagnosis not present

## 2016-03-09 DIAGNOSIS — G4733 Obstructive sleep apnea (adult) (pediatric): Secondary | ICD-10-CM | POA: Diagnosis not present

## 2016-03-16 ENCOUNTER — Other Ambulatory Visit: Payer: Self-pay | Admitting: *Deleted

## 2016-03-16 ENCOUNTER — Encounter: Payer: Self-pay | Admitting: *Deleted

## 2016-03-16 VITALS — BP 120/78 | HR 68 | Resp 20

## 2016-03-16 DIAGNOSIS — I5030 Unspecified diastolic (congestive) heart failure: Secondary | ICD-10-CM

## 2016-03-16 NOTE — Patient Outreach (Signed)
[place] [employer]) Care Management   03/16/2016  [name] [name] 1951-04-20 [id number]  [name] [name] is an 65 y.o. male  Subjective: Patient states "feeling a lot better". He states still not having the stamina as he wanted to but "still remains as a working progress", otherwise, "feeling good overall". He denies pain or discomfort  Objective: BP 120/78 mmHg  Pulse 68  Resp 20  SpO2 97%   Review of Systems  Constitutional: Negative.   HENT: Negative.   Eyes:       Wears eyeglasses- new prescription glasses  Respiratory: Positive for shortness of breath. Negative for cough, sputum production and wheezing.        Clear to auscultation  Respirations even and unlabored at rest; gets short of breath with exertion  Cardiovascular: Positive for leg swelling.       Regular rate and rhythm Bilateral lower extremity edema; non-pitting; left leg greater than right  Gastrointestinal: Negative.        Bowel sounds present Abdomen obese, soft, non-tender  Genitourinary: Positive for frequency.       Takes Lasix Started on Flomax for episodes of urinary incontinence with improvement per patient  Musculoskeletal: Negative.  Negative for falls.  Skin:       Dry/ flaky bilateral lower extremities- some improvement with lotion use  Neurological: Positive for seizures.       Episodes of right arm involuntary movement  Endo/Heme/Allergies: Negative.   Psychiatric/Behavioral: Negative.     Physical Exam  Current Medications:   Current Outpatient Prescriptions  Medication Sig Dispense Refill  . albuterol (PROVENTIL HFA;VENTOLIN HFA) 108 (90 BASE) MCG/ACT inhaler Inhale 2 puffs into the lungs every 6 (six) hours as needed. 1 Inhaler 5  . amiodarone (PACERONE) 200 MG tablet Take 1 tablet (200 mg total) by mouth 2 (two) times daily. 60 tablet 2  . aspirin EC 81 MG tablet Take 81 mg by mouth daily.      Marland Kitchen atorvastatin (LIPITOR) 40 MG tablet Take 1 tablet (40 mg total) by mouth daily  at 6 PM. 30 tablet 3  . clotrimazole-betamethasone (LOTRISONE) cream Apply 1 application topically 2 (two) times daily. (Patient taking differently: Apply 1 application topically 2 (two) times daily as needed (FOR RASH). ) 30 g 1  . colchicine 0.6 MG tablet Take 1 tablet (0.6 mg total) by mouth 2 (two) times daily. 60 tablet 3  . digoxin (LANOXIN) 0.125 MG tablet Take 1 tablet (0.125 mg total) by mouth daily. 30 tablet 3  . fluticasone-salmeterol (ADVAIR HFA) 115-21 MCG/ACT inhaler Inhale 2 puffs into the lungs daily as needed. For shortness of breath 1 Inhaler 3  . furosemide (LASIX) 40 MG tablet   3  . levETIRAcetam (KEPPRA) 750 MG tablet Take 2 tablets (1,500 mg total) by mouth 2 (two) times daily. 120 tablet 11  . lisinopril (PRINIVIL,ZESTRIL) 20 MG tablet Take 20 mg by mouth daily.     . metoprolol succinate (TOPROL XL) 25 MG 24 hr tablet Take 1 tablet (25 mg total) by mouth daily. 30 tablet 3  . nitroGLYCERIN (NITROSTAT) 0.4 MG SL tablet Place 1 tablet (0.4 mg total) under the tongue every 5 (five) minutes as needed for chest pain. 25 tablet 1  . pantoprazole (PROTONIX) 40 MG tablet     . tamsulosin (FLOMAX) 0.4 MG CAPS capsule Take 1 capsule (0.4 mg total) by mouth daily. 30 capsule 3  . Vitamin D, Ergocalciferol, (DRISDOL) 50000 units CAPS capsule Take 1 capsule (50,000  Units total) by mouth every Wednesday. 10 capsule 2  . ADVAIR DISKUS 250-50 MCG/DOSE AEPB Reported on 03/16/2016  3  . albuterol (PROVENTIL) (2.5 MG/3ML) 0.083% nebulizer solution Take 3 mLs (2.5 mg total) by nebulization every 6 (six) hours as needed for wheezing or shortness of breath. (Patient not taking: Reported on 03/16/2016) 150 mL 1  . ferrous sulfate 325 (65 FE) MG tablet Take 325 mg by mouth daily with breakfast. Reported on 03/16/2016    . omeprazole (PRILOSEC) 40 MG capsule Take 1 capsule (40 mg total) by mouth 2 (two) times daily before a meal. (Patient not taking: Reported on 03/16/2016) 60 capsule 3   No current  facility-administered medications for this visit.    Functional Status:   In your present state of health, do you have any difficulty performing the following activities: 03/16/2016 02/15/2016  Hearing? N N  Vision? N N  Difficulty concentrating or making decisions? N N  Walking or climbing stairs? N Y  Dressing or bathing? N N  Doing errands, shopping? N N  Preparing Food and eating ? N N  Using the Toilet? N N  In the past six months, have you accidently leaked urine? Y N  Do you have problems with loss of bowel control? N N  Managing your Medications? N N  Managing your Finances? N N  Housekeeping or managing your Housekeeping? N Y    Fall/Depression Screening:    PHQ 2/9 Scores 03/16/2016 02/22/2016 02/15/2016 02/07/2016 01/10/2016 01/06/2016 12/23/2015  PHQ - 2 Score 0 0 0 0 0 0 0    Assessment:   Arrived at patient's apartment and patient was folding clothes as stated. He reports that he feels a lot better.  Patient informs care management coordinator that personal care service from [place] through [facility] was denied. He stated that it wasn't worth appealing for it.  Patient states he will be able to make it through doing the best he can to take care of his needs as he continues to regain back his stamina although it is a slow process, as verbalized.  Patient denies any changes in breathing pattern. His shortness of breath with exertion is not more than usual and at baseline. He states that his appetite is better now and gained some weight. Weight today is 240 pounds.  He verbalized being on the "green zone".  Patient denies coughing, mucus production,chest tightness and shortness of breath at rest. Non- pitting edema persists to bilateral lower extremities with slight improvement. Patient is aware to elevate his legs as much as he can to help keep swelling down. He states that he continues to do walking, bending and stretching exercises. He reports walking about 2  blocks to their apartment mailbox and back and will usually take a rest on his way back.  Patient moisturizes his lower extremities with lotion to help reduce flakiness/ dryness as reported.  Mr. Shaff reports seldom use of Albuterol inhaler and Advair breathing treatment. Patient admits not being able to monitor daily weights as he used to since he just came back from [place] to take care of his grandmother until she was placed on an assisted living facility.  Medications reviewed with patient. Patient informed care management coordinator that he will pick-up over-the-counter Iron since he left it at his grandmother's house.   Mr. Kritikos had been seen by cardiologist (Dr. Terrence Dupont) with no medication changes. Blood pressure remains stable with low salt, heart healthy diet and with in goal  of less than 140/90.  He had episodes of urine incontinence and was placed on Flomax with reported improvement per patient. Patient has a scheduled follow-up visit with primary care provider on 3/28.  Appointment to see gastroenterologist (Dr. Loletha Carrow) on 02/29/16 for anorexia follow-up and colonoscopy was cancelled. Patient will have a follow-up appointment with Dr. Loanne Drilling (GI) scheduled on 5/30. Patient had been seen by Neurologist for abnormal involuntary movement of the right arm. He continues on Keppra and will schedule for a sleep deprived EEG that patient had refused last year. Patient's appointment with Dr. Chase Caller for PFT (Pulmonary Function Test) is on 4/5.  Reinforced to patient regarding [employer] benefits for transportation to his doctors' appointments if needed. Patient shared that he also has a friend who can transport him when needed.  Patient denies being able to identify any additional needs or concerns at this time. He agreed to case closure on the community care management side and transition to [place] for further disease management.  He is aware to call [facility], care management coordinator or  24-hour nurse line if necessary. Contact information with patient.   Plan: Will do case closure on patient as most goals of treatment had been met from community care management standpoint and no additional needs identified at this time. Will notify primary care provider of transition to [place] for further disease management.     THN CM Care Plan Problem One        Most Recent Value   Care Plan Problem One  at risk for readmission   Role Documenting the Problem One  Care Management [place] for Problem One  Active   THN Long Term Goal (31-90 days)  patient will not be readmitted in the next 60 days   THN Long Term Goal Start Date  01/06/16   [facility] Long Term Goal Met Date  03/16/16 [name] met]   THN CM Short Term Goal #1 (0-30 days)  patient will identify the 3 heart failure zones and enumerate at least 3 signs and symptoms under each zone and when to call the doctor for help in the next 30 days    THN CM Short Term Goal #1 Start Date  01/06/16   [facility] CM Short Term Goal #1 Met Date  02/07/16 [Goal met.]   THN CM Short Term Goal #2 (0-30 days)  Patient will verbalize the 5 steps of managing heart failure or living better with heart failure in the next 30 days    THN CM Short Term Goal #2 Start Date  01/06/16   [facility] CM Short Term Goal #2 Met Date  02/07/16 [Goal met.]   THN CM Short Term Goal #3 (0-30 days)  patient will adhere to medications as ordered such as getting Keppra refilled and picking-up over-the counter Iron medication within a week   THN CM Short Term Goal #3 Start Date  01/06/16   [facility] CM Short Term Goal #3 Met Date  01/17/16 [Goal met-reports having started Keppra, Iron refills & Lasix]   THN CM Short Term Goal #4 (0-30 days)  patient will continue to weigh self and record in the next 30 days   THN CM Short Term Goal #4 Start Date  02/07/16 [not done-have to care for grandmother in [place].--CONT.with GOAL]   Interventions for Short Term Goal #4  encourage  patient to restart checking daily weight and record in [facility] notebook/ organizer provided,  teach patient on proper way to  check weight-- in the morning after using the bathroom, before getting dressed and before eating a meal,  monitor for weight gain 3 pounds overnight or 5 pounds in a week and report to provider if any,  explain the importance of checking weight daily and recording    [facility] CM Care Plan Problem Two        Most Recent Value   Care Plan Problem Two  patient expresses easy fatigability and weak stamina   Role Documenting the Problem Two  Care Management Coordinator   Care Plan for Problem Two  Active   THN Long Term Goal (31-90) days  patient will report being able to tolerate walking at least 15 minutes daily as part of his exercise in the next 60 days   THN Long Term Goal Start Date  01/06/16   [facility] Long Term Goal Met Date  03/16/16 [name] met- able to walk about 30 minutes a day]   THN CM Short Term Goal #1 (0-30 days)  patient will report being able to walk to his mailbox with lesser rest periods in the next 30 days    THN CM Short Term Goal #1 Start Date  01/06/16   [facility] CM Short Term Goal #1 Met Date   02/07/16 [Goal met- takes 1 rest period coming back from mailbox]     Zamara Cozad A. Marlaina Coburn, BSN, RN-BC [place] Management Coordinator Cell: [phone]

## 2016-03-17 ENCOUNTER — Encounter: Payer: Self-pay | Admitting: *Deleted

## 2016-03-18 ENCOUNTER — Other Ambulatory Visit: Payer: Self-pay | Admitting: Family Medicine

## 2016-03-21 ENCOUNTER — Ambulatory Visit: Payer: Commercial Managed Care - HMO | Attending: Family Medicine | Admitting: Family Medicine

## 2016-03-21 ENCOUNTER — Encounter: Payer: Self-pay | Admitting: Family Medicine

## 2016-03-21 VITALS — BP 156/96 | HR 62 | Temp 97.7°F | Resp 15 | Ht 67.0 in | Wt 241.2 lb

## 2016-03-21 DIAGNOSIS — Z79899 Other long term (current) drug therapy: Secondary | ICD-10-CM | POA: Insufficient documentation

## 2016-03-21 DIAGNOSIS — I10 Essential (primary) hypertension: Secondary | ICD-10-CM | POA: Diagnosis not present

## 2016-03-21 DIAGNOSIS — I2510 Atherosclerotic heart disease of native coronary artery without angina pectoris: Secondary | ICD-10-CM | POA: Diagnosis not present

## 2016-03-21 DIAGNOSIS — I480 Paroxysmal atrial fibrillation: Secondary | ICD-10-CM | POA: Diagnosis not present

## 2016-03-21 DIAGNOSIS — R569 Unspecified convulsions: Secondary | ICD-10-CM | POA: Diagnosis not present

## 2016-03-21 DIAGNOSIS — I5032 Chronic diastolic (congestive) heart failure: Secondary | ICD-10-CM | POA: Diagnosis not present

## 2016-03-21 DIAGNOSIS — J438 Other emphysema: Secondary | ICD-10-CM | POA: Diagnosis not present

## 2016-03-21 DIAGNOSIS — N138 Other obstructive and reflux uropathy: Secondary | ICD-10-CM | POA: Insufficient documentation

## 2016-03-21 DIAGNOSIS — Z8709 Personal history of other diseases of the respiratory system: Secondary | ICD-10-CM

## 2016-03-21 DIAGNOSIS — K219 Gastro-esophageal reflux disease without esophagitis: Secondary | ICD-10-CM | POA: Insufficient documentation

## 2016-03-21 DIAGNOSIS — I4892 Unspecified atrial flutter: Secondary | ICD-10-CM | POA: Insufficient documentation

## 2016-03-21 DIAGNOSIS — J45909 Unspecified asthma, uncomplicated: Secondary | ICD-10-CM | POA: Insufficient documentation

## 2016-03-21 DIAGNOSIS — N400 Enlarged prostate without lower urinary tract symptoms: Secondary | ICD-10-CM | POA: Diagnosis not present

## 2016-03-21 DIAGNOSIS — J449 Chronic obstructive pulmonary disease, unspecified: Secondary | ICD-10-CM | POA: Diagnosis not present

## 2016-03-21 DIAGNOSIS — Z7982 Long term (current) use of aspirin: Secondary | ICD-10-CM | POA: Insufficient documentation

## 2016-03-21 MED ORDER — ALBUTEROL SULFATE HFA 108 (90 BASE) MCG/ACT IN AERS: 2.0000 | INHALATION_SPRAY | Freq: Four times a day (QID) | RESPIRATORY_TRACT | Status: DC | PRN

## 2016-03-21 MED ORDER — ADVAIR DISKUS 250-50 MCG/DOSE IN AEPB: 1.0000 | INHALATION_SPRAY | Freq: Two times a day (BID) | RESPIRATORY_TRACT | Status: DC

## 2016-03-21 NOTE — Progress Notes (Signed)
Patient reports no pain or concerns today He needs no refills

## 2016-03-21 NOTE — Patient Instructions (Signed)
Benign Prostatic Hyperplasia An enlarged prostate (benign prostatic hyperplasia) is common in older men. You may experience the following:  Weak urine stream.  Dribbling.  Feeling like the bladder has not emptied completely.  Difficulty starting urination.  Getting up frequently at night to urinate.  Urinating more frequently during the day. HOME CARE INSTRUCTIONS  Monitor your prostatic hyperplasia for any changes. The following actions may help to alleviate any discomfort you are experiencing:  Give yourself time when you urinate.  Stay away from alcohol.  Avoid beverages containing caffeine, such as coffee, tea, and colas, because they can make the problem worse.  Avoid decongestants, antihistamines, and some prescription medicines that can make the problem worse.  Follow up with your health care provider for further treatment as recommended. SEEK MEDICAL CARE IF:  You are experiencing progressive difficulty voiding.  Your urine stream is progressively getting narrower.  You are awaking from sleep with the urge to void more frequently.  You are constantly feeling the need to void.  You experience loss of urine, especially in small amounts. SEEK IMMEDIATE MEDICAL CARE IF:   You develop increased pain with urination or are unable to urinate.  You develop severe abdominal pain, vomiting, a high fever, or fainting.  You develop back pain or blood in your urine. MAKE SURE YOU:   Understand these instructions.  Will watch your condition.  Will get help right away if you are not doing well or get worse.   This information is not intended to replace advice given to you by your health care provider. Make sure you discuss any questions you have with your health care provider.   Document Released: 12/11/2005 Document Revised: 01/01/2015 Document Reviewed: 05/13/2013 Elsevier Interactive Patient Education 2016 Elsevier Inc.  

## 2016-03-21 NOTE — Progress Notes (Signed)
Subjective:  Patient ID: [name] [name], male    DOB: 06/06/1951  Age: 65 y.o. MRN: [id number]  CC: Follow-up   HPI [name] [name] is a 65 year old male with a history of paroxysmal A. fib, atrial flutter, diastolic congestive heart failure (EF 50-55% in 11/2015), CAD (status post cardiac cath in 11/2015),  COPD, GERD, seizures, history of lower urinary tract symptoms who was commenced on Flomax at his last office visit and reports improvement in symptoms.  He is yet to take his antihypertensives today hence his elevated blood pressure. He does have a baseline shortness of breath with exertion but denies any pedal edema. Remains on his diuretics and also uses his asthma inhalers. He was recently seen by [facility] neurology and remains on [place] for his seizures.  He has no new complaints today.  Outpatient Prescriptions Prior to Visit  Medication Sig Dispense Refill  . albuterol (PROVENTIL) (2.5 MG/3ML) 0.083% nebulizer solution Take 3 mLs (2.5 mg total) by nebulization every 6 (six) hours as needed for wheezing or shortness of breath. 150 mL 1  . amiodarone (PACERONE) 200 MG tablet Take 1 tablet (200 mg total) by mouth 2 (two) times daily. 60 tablet 2  . aspirin EC 81 MG tablet Take 81 mg by mouth daily.      Marland Kitchen atorvastatin (LIPITOR) 40 MG tablet Take 1 tablet (40 mg total) by mouth daily at 6 PM. 30 tablet 3  . clotrimazole-betamethasone (LOTRISONE) cream Apply 1 application topically 2 (two) times daily. (Patient taking differently: Apply 1 application topically 2 (two) times daily as needed (FOR RASH). ) 30 g 1  . colchicine 0.6 MG tablet Take 1 tablet (0.6 mg total) by mouth 2 (two) times daily. 60 tablet 3  . digoxin (LANOXIN) 0.125 MG tablet Take 1 tablet (0.125 mg total) by mouth daily. 30 tablet 3  . ferrous sulfate 325 (65 FE) MG tablet Take 325 mg by mouth daily with breakfast. Reported on 03/16/2016    . furosemide (LASIX) 20 MG tablet TAKE 1 TABLET (20 MG TOTAL) BY MOUTH DAILY.  30 tablet 0  . levETIRAcetam (KEPPRA) 750 MG tablet Take 2 tablets (1,500 mg total) by mouth 2 (two) times daily. 120 tablet 11  . lisinopril (PRINIVIL,ZESTRIL) 20 MG tablet Take 20 mg by mouth daily.     . metoprolol succinate (TOPROL XL) 25 MG 24 hr tablet Take 1 tablet (25 mg total) by mouth daily. 30 tablet 3  . nitroGLYCERIN (NITROSTAT) 0.4 MG SL tablet Place 1 tablet (0.4 mg total) under the tongue every 5 (five) minutes as needed for chest pain. 25 tablet 1  . omeprazole (PRILOSEC) 40 MG capsule Take 1 capsule (40 mg total) by mouth 2 (two) times daily before a meal. 60 capsule 3  . pantoprazole (PROTONIX) 40 MG tablet     . tamsulosin (FLOMAX) 0.4 MG CAPS capsule Take 1 capsule (0.4 mg total) by mouth daily. 30 capsule 3  . Vitamin D, Ergocalciferol, (DRISDOL) 50000 units CAPS capsule Take 1 capsule (50,000 Units total) by mouth every Wednesday. 10 capsule 2  . ADVAIR DISKUS 250-50 MCG/DOSE AEPB Reported on 03/16/2016  3  . albuterol (PROVENTIL HFA;VENTOLIN HFA) 108 (90 BASE) MCG/ACT inhaler Inhale 2 puffs into the lungs every 6 (six) hours as needed. 1 Inhaler 5  . fluticasone-salmeterol (ADVAIR HFA) 115-21 MCG/ACT inhaler Inhale 2 puffs into the lungs daily as needed. For shortness of breath 1 Inhaler 3   No facility-administered medications prior to visit.  ROS Review of Systems  Constitutional: Negative for activity change and appetite change.  HENT: Negative for sinus pressure and sore throat.   Eyes: Negative for visual disturbance.  Respiratory: Positive for shortness of breath. Negative for cough, chest tightness and wheezing.   Cardiovascular: Negative for chest pain, palpitations and leg swelling.  Gastrointestinal: Negative for abdominal pain, diarrhea, constipation and abdominal distention.  Endocrine: Negative.   Genitourinary: Negative.  Negative for dysuria.  Musculoskeletal: Negative.  Negative for myalgias and joint swelling.  Skin: Negative for rash.    Allergic/Immunologic: Negative.   Neurological: Negative for weakness, light-headedness and numbness.  Psychiatric/Behavioral: Negative for suicidal ideas, behavioral problems and dysphoric mood.    Objective:  BP 156/96 mmHg  Pulse 62  Temp(Src) 97.7 F (36.5 C)  Resp 15  Ht 5\' 7"  (1.702 m)  Wt 241 lb 3.2 oz (109.408 kg)  BMI 37.77 kg/m2  SpO2 97%  BP/Weight 03/21/2016 03/16/2016 [id number]  Systolic BP [id number]  Diastolic BP 96 78 84  Wt. (Lbs) 241.2 - 241  BMI 37.77 - 37.74      Physical Exam  Constitutional: He is oriented to person, place, and time. He appears well-developed and well-nourished.  obese  Cardiovascular: Normal rate, normal heart sounds and intact distal pulses.   No murmur heard. Pulmonary/Chest: Effort normal and breath sounds normal. He has no wheezes. He has no rales. He exhibits no tenderness.  Abdominal: Soft. Bowel sounds are normal. He exhibits no distension and no mass. There is no tenderness.  Musculoskeletal: Normal range of motion.  Neurological: He is alert and oriented to person, place, and time.     Assessment & Plan:   1. History of asthma Controlled - ADVAIR DISKUS 250-50 MCG/DOSE AEPB; Inhale 1 puff into the lungs 2 (two) times daily.  Dispense: 60 each; Refill: 3 - albuterol (PROVENTIL HFA;VENTOLIN HFA) 108 (90 Base) MCG/ACT inhaler; Inhale 2 puffs into the lungs every 6 (six) hours as needed.  Dispense: 1 Inhaler; Refill: 5  2. Essential hypertension, benign Uncontrolled due to the fact that he is yet to take his antihypertensives  3. Other emphysema ([place]) Stable  4. Benign prostatic hyperplasia Controlled on Flomax  5. Seizures ([place]) No recent seizures. Controlled on Keppra   Meds ordered this encounter  Medications  . ADVAIR DISKUS 250-50 MCG/DOSE AEPB    Sig: Inhale 1 puff into the lungs 2 (two) times daily.    Dispense:  60 each    Refill:  3  . albuterol (PROVENTIL HFA;VENTOLIN HFA) 108 (90 Base) MCG/ACT  inhaler    Sig: Inhale 2 puffs into the lungs every 6 (six) hours as needed.    Dispense:  1 Inhaler    Refill:  5    Follow-up: Return in about 3 months (around 06/21/2016) for Follow-up on asthma and BPH.   Arnoldo Morale MD

## 2016-03-23 ENCOUNTER — Other Ambulatory Visit: Payer: Self-pay

## 2016-03-23 NOTE — Patient Outreach (Signed)
[place] [facility]) Care Management  03/23/2016  RAYFUS SWEDLUND 1951/01/02 [id number]   Telephone call to patient for introductory call.  Patient reports he is doing well.  Patient receptive to call.  Patient agrees to health coach outreach for disease management of heart failure.    Plan: RN Health Coach will contact patient within one month and patient agrees to next outreach.    Jone Baseman, RN, MSN [place] [phone]

## 2016-03-29 ENCOUNTER — Ambulatory Visit: Payer: Commercial Managed Care - HMO | Admitting: Internal Medicine

## 2016-03-29 LAB — PULMONARY FUNCTION TEST
DL/VA % pred: 134 %
DL/VA: 5.88 ml/min/mmHg/L
DLCO cor % pred: 86 %
DLCO cor: 23.76 ml/min/mmHg
DLCO unc % pred: 82 %
DLCO unc: 22.92 ml/min/mmHg
FEF 25-75 Post: 1.54 L/sec
FEF 25-75 Pre: 1.5 L/sec
FEF2575-%Change-Post: 3 %
FEF2575-%Pred-Post: 67 %
FEF2575-%Pred-Pre: 65 %
FEV1-%Change-Post: 1 %
FEV1-%PRED-PRE: 71 %
FEV1-%Pred-Post: 72 %
FEV1-POST: 1.87 L
FEV1-Pre: 1.83 L
FEV1FVC-%CHANGE-POST: 2 %
FEV1FVC-%Pred-Pre: 98 %
FEV6-%CHANGE-POST: 0 %
FEV6-%PRED-PRE: 75 %
FEV6-%Pred-Post: 75 %
FEV6-PRE: 2.43 L
FEV6-Post: 2.42 L
FEV6FVC-%PRED-PRE: 105 %
FEV6FVC-%Pred-Post: 105 %
FVC-%CHANGE-POST: 0 %
FVC-%PRED-POST: 71 %
FVC-%PRED-PRE: 72 %
FVC-POST: 2.42 L
FVC-PRE: 2.43 L
POST FEV6/FVC RATIO: 100 %
Post FEV1/FVC ratio: 77 %
Pre FEV1/FVC ratio: 76 %
Pre FEV6/FVC Ratio: 100 %
RV % pred: 87 %
RV: 1.92 L
TLC % PRED: 69 %
TLC: 4.37 L

## 2016-03-30 ENCOUNTER — Ambulatory Visit (INDEPENDENT_AMBULATORY_CARE_PROVIDER_SITE_OTHER): Payer: Commercial Managed Care - HMO | Admitting: Internal Medicine

## 2016-03-30 DIAGNOSIS — J45909 Unspecified asthma, uncomplicated: Secondary | ICD-10-CM

## 2016-03-30 NOTE — Progress Notes (Signed)
PFT done today. 

## 2016-04-05 ENCOUNTER — Ambulatory Visit: Payer: Self-pay

## 2016-04-09 DIAGNOSIS — J449 Chronic obstructive pulmonary disease, unspecified: Secondary | ICD-10-CM | POA: Diagnosis not present

## 2016-04-09 DIAGNOSIS — I5040 Unspecified combined systolic (congestive) and diastolic (congestive) heart failure: Secondary | ICD-10-CM | POA: Diagnosis not present

## 2016-04-09 DIAGNOSIS — G4733 Obstructive sleep apnea (adult) (pediatric): Secondary | ICD-10-CM | POA: Diagnosis not present

## 2016-04-12 ENCOUNTER — Other Ambulatory Visit: Payer: Self-pay

## 2016-04-12 NOTE — Patient Outreach (Signed)
[place] [facility]) Care Management  04/12/2016  [name] [name] 18-May-1951 [id number]   Telephone call to patient for initial assessment.  Patient reports he is driving and will call back in ten minutes.    Plan:RN Health Coach will wait patient return call.  If no return call, will attempt again within 1-2 weeks.  Jone Baseman, RN, MSN [place] [phone]

## 2016-04-18 ENCOUNTER — Other Ambulatory Visit: Payer: Self-pay

## 2016-04-18 NOTE — Patient Outreach (Signed)
[facility]) Care Management  04/18/2016  [name] [name] 08/05/1951 [id number]   Telephone call to patient for initial assessment. Patient reports he is driving right now.  He states he will call back.  Patient able to repeat call back number.    Plan: RN Health Coach will await patient return call. If no call back, will attempt patient again in 1-2 weeks.   Jone Baseman, RN, MSN [place] [phone]

## 2016-04-19 ENCOUNTER — Ambulatory Visit: Payer: Self-pay

## 2016-04-20 ENCOUNTER — Ambulatory Visit (INDEPENDENT_AMBULATORY_CARE_PROVIDER_SITE_OTHER): Payer: Commercial Managed Care - HMO | Admitting: Gastroenterology

## 2016-04-20 ENCOUNTER — Encounter: Payer: Self-pay | Admitting: Gastroenterology

## 2016-04-20 VITALS — BP 140/80 | HR 98 | Ht 67.0 in | Wt 249.9 lb

## 2016-04-20 DIAGNOSIS — R630 Anorexia: Secondary | ICD-10-CM | POA: Diagnosis not present

## 2016-04-20 NOTE — Patient Instructions (Signed)
Follow up as needed.  Thank you for choosing [facility]  Dr Henry Danis III  

## 2016-04-20 NOTE — Progress Notes (Signed)
[facility] Progress Note  Chief Complaint: Anorexia  Subjective History:   Dec 2014, [name] [name] wrote" History of Present Illness:  This is a very nice 65 year old African American male who complains of indigestion and regurgitation with a rumination-type syndrome.  He apparently had a chicken bone stuck in his esophagus in 2002 and underwent endoscopic surgery.  Since that time he has had postprandial regurgitation of partially digested food products, and he does not feel that the material that regurgitates his ascitic, and has not been on any acid suppression therapy when masses.  He denies true dysphagia for solids or liquids.  I cannot see a recent followup endoscopy or barium swallow since 2002, but his records are incomplete.  He allegedly had a colonoscopy in 2009 which was normal.  He denies other GI complaints, and despite all these symptoms his had no anorexia or weight loss.  He has regular bowel movements without melena or hematochezia, but apparently it has been found to be iron deficient by Dr. Terrence [name] in cardiology who sees because of a history of atrial fibrillation.  Patient is on antihypertensives and also oral iron and daily aspirin.  He was [name] was placed on omeprazole 40 mg and Reglan 5 mg before meals and at bedtime with some improvement.  There is no history of known hepatitis, pancreatitis, or bowel obstruction.  Again records are not available for review.  Patient denies a specific food intolerance"  Barium showed small HH  On this occasion, [name] [name] was referred to [place] a few months ago by his PCP because he had anorexia after a severe pneumonia illness causing hospitalization in December 2016. There was apparently concern that perhaps he had developed some digestive condition. It is known that his last colonoscopy was in 2009, it is normal by the patient's report. No report can be found in the Epic records. He denies abdominal pain altered  bowel habits rectal bleeding or family history of colon cancer. He reports that his appetite was off for about 2 months after this major illness, but is now returned to normal. Nevertheless, he feels breathing is not return to normal, and he gets dyspneic which is walking "a couple of blocks". He is due to see pulmonary in 2 weeks.  ROS: Cardiovascular:  no chest pain Respiratory: no dyspnea  The patient's Past Medical, Family and Social History were reviewed and are on file in the EMR.  Objective:  Med list reviewed  Vital signs in last 24 hrs: Filed Vitals:   04/20/16 1011  BP: 140/80  Pulse: 98    Physical Exam Morbidly obese man, appears mildly dyspneic at rest.  He is certainly in no rest of her distress.  HEENT: sclera anicteric, oral mucosa moist without lesions  Neck: supple, no thyromegaly, JVD or lymphadenopathy  Cardiac: RRR without murmurs, S1S2 heard, no peripheral edema  Pulm: clear to auscultation bilaterally, normal RR and effort noted  Abdomen: soft, no tenderness, with active bowel sounds. No guarding or palpable hepatosplenomegaly.  Skin; warm and dry, no jaundice or rash     @ASSESSMENTPLANBEGIN @ Assessment: Encounter Diagnosis  Name Primary?  . Loss of appetite Yes    The symptoms for which she was referred seem to been related to a recent acute illness, now resolved.  Plan:  Recall 2 yrs from now for routine colonoscopy, see [place] sooner as needed.  [name] [name]

## 2016-04-26 DIAGNOSIS — M1990 Unspecified osteoarthritis, unspecified site: Secondary | ICD-10-CM | POA: Diagnosis not present

## 2016-04-26 DIAGNOSIS — I10 Essential (primary) hypertension: Secondary | ICD-10-CM | POA: Diagnosis not present

## 2016-04-26 DIAGNOSIS — E785 Hyperlipidemia, unspecified: Secondary | ICD-10-CM | POA: Diagnosis not present

## 2016-04-26 DIAGNOSIS — D649 Anemia, unspecified: Secondary | ICD-10-CM | POA: Diagnosis not present

## 2016-04-26 DIAGNOSIS — J45909 Unspecified asthma, uncomplicated: Secondary | ICD-10-CM | POA: Diagnosis not present

## 2016-04-26 DIAGNOSIS — I2510 Atherosclerotic heart disease of native coronary artery without angina pectoris: Secondary | ICD-10-CM | POA: Diagnosis not present

## 2016-04-26 DIAGNOSIS — I4891 Unspecified atrial fibrillation: Secondary | ICD-10-CM | POA: Diagnosis not present

## 2016-04-26 DIAGNOSIS — K219 Gastro-esophageal reflux disease without esophagitis: Secondary | ICD-10-CM | POA: Diagnosis not present

## 2016-04-26 DIAGNOSIS — E669 Obesity, unspecified: Secondary | ICD-10-CM | POA: Diagnosis not present

## 2016-04-27 ENCOUNTER — Other Ambulatory Visit: Payer: Self-pay

## 2016-04-27 NOTE — Patient Outreach (Signed)
[facility]) Care Management  [place]  04/27/2016   RAISHAWN NAZAIRE [birth year] [id number]  Subjective: Telephone call to patient for initial assessment.  Patient reports he is doing better. Patient reports he still is working on his stamina but does everything for himself.  Patient reports last weight was 245 lbs.  He states he tries to weigh daily.  Discussed with patient importance of daily weights and heart failure zones.  He verbalized understanding. No concerns.   Objective:   Encounter Medications:  Outpatient Encounter Prescriptions as of 04/27/2016  Medication Sig Note  . ADVAIR DISKUS 250-50 MCG/DOSE AEPB Inhale 1 puff into the lungs 2 (two) times daily.   Marland Kitchen albuterol (PROVENTIL HFA;VENTOLIN HFA) 108 (90 Base) MCG/ACT inhaler Inhale 2 puffs into the lungs every 6 (six) hours as needed.   Marland Kitchen albuterol (PROVENTIL) (2.5 MG/3ML) 0.083% nebulizer solution Take 3 mLs (2.5 mg total) by nebulization every 6 (six) hours as needed for wheezing or shortness of breath.   Marland Kitchen amiodarone (PACERONE) 200 MG tablet Take 1 tablet (200 mg total) by mouth 2 (two) times daily.   Marland Kitchen aspirin EC 81 MG tablet Take 81 mg by mouth daily.     Marland Kitchen atorvastatin (LIPITOR) 40 MG tablet Take 1 tablet (40 mg total) by mouth daily at 6 PM.   . clotrimazole-betamethasone (LOTRISONE) cream Apply 1 application topically 2 (two) times daily. (Patient taking differently: Apply 1 application topically 2 (two) times daily as needed (FOR RASH). )   . colchicine 0.6 MG tablet Take 1 tablet (0.6 mg total) by mouth 2 (two) times daily.   . digoxin (LANOXIN) 0.125 MG tablet Take 1 tablet (0.125 mg total) by mouth daily.   . ferrous sulfate 325 (65 FE) MG tablet Take 325 mg by mouth daily with breakfast. Reported on 03/16/2016 02/07/2016: Will pick it up over the counter per patient.  . furosemide (LASIX) 20 MG tablet TAKE 1 TABLET (20 MG TOTAL) BY MOUTH DAILY.   Marland Kitchen levETIRAcetam (KEPPRA) 750 MG tablet Take 2  tablets (1,500 mg total) by mouth 2 (two) times daily. 01/06/2016: Patient called [facility] to refill medication.  Marland Kitchen lisinopril (PRINIVIL,ZESTRIL) 20 MG tablet Take 20 mg by mouth daily.    . metoprolol succinate (TOPROL XL) 25 MG 24 hr tablet Take 1 tablet (25 mg total) by mouth daily.   . nitroGLYCERIN (NITROSTAT) 0.4 MG SL tablet Place 1 tablet (0.4 mg total) under the tongue every 5 (five) minutes as needed for chest pain.   Marland Kitchen omeprazole (PRILOSEC) 40 MG capsule Take 1 capsule (40 mg total) by mouth 2 (two) times daily before a meal. 01/06/2016: Patient is taking Pantoprazole 40 mg daily.  . pantoprazole (PROTONIX) 40 MG tablet  03/08/2016: Received from: External Pharmacy  . tamsulosin (FLOMAX) 0.4 MG CAPS capsule Take 1 capsule (0.4 mg total) by mouth daily.   . Vitamin D, Ergocalciferol, (DRISDOL) 50000 units CAPS capsule Take 1 capsule (50,000 Units total) by mouth every Wednesday.    No facility-administered encounter medications on file as of 04/27/2016.    Functional Status:  In your present state of health, do you have any difficulty performing the following activities: 04/27/2016 03/23/2016  Hearing? N N  Vision? N N  Difficulty concentrating or making decisions? N N  Walking or climbing stairs? N N  Dressing or bathing? N N  Doing errands, shopping? N N  Preparing Food and eating ? N N  Using the Toilet? N  N  In the past six months, have you accidently leaked urine? N N  Do you have problems with loss of bowel control? N N  Managing your Medications? N N  Managing your Finances? N N  Housekeeping or managing your Housekeeping? N N    Fall/Depression Screening: PHQ 2/9 Scores 04/27/2016 03/23/2016 03/21/2016 03/16/2016 02/22/2016 02/15/2016 02/07/2016  PHQ - 2 Score 0 0 0 0 0 0 0    Assessment: Patient will benefit from health coach outreach for continued disease management and support for heart failure.  Plan:  [facility] CM Care Plan Problem One        Most  Recent Value   Care Plan Problem One  Heart failure knowledge deficit   Role Documenting the Problem One  [place] for Problem One  Active   THN Long Term Goal (31-90 days)  Patient will be able to identify heart failure zones within 90 days.     THN Long Term Goal Start Date  04/27/16   Interventions for Problem One Long Term Goal  RN Health Coach discussed with patient heart failure zones and when to notify physician.     THN CM Short Term Goal #4 (0-30 days)  patient will continue to weigh self and record in the next 30 days   THN CM Short Term Goal #4 Start Date  04/27/16   Interventions for Short Term Goal #4  [place] discussed with patient importance of daily weights.       RN Health Coach will provide ongoing education for patient on heart failure through phone calls and sending printed information to patient for further discussion.  RN Health Coach will send welcome packet with consent to patient as well as printed information on heart failure.  RN Health Coach will send initial barriers letter, assessment, and care plan to primary care physician.  RN Health Coach will contact patient within one month and patient agrees to next contact.   Jone Baseman, RN, MSN [place] [phone]

## 2016-04-28 MED FILL — METOPROLOL SUCC ER 50 MG TA: 50 | 30 days supply | Qty: 30 | Fill #0

## 2016-05-03 ENCOUNTER — Ambulatory Visit (INDEPENDENT_AMBULATORY_CARE_PROVIDER_SITE_OTHER): Payer: Commercial Managed Care - HMO | Admitting: Internal Medicine

## 2016-05-03 ENCOUNTER — Other Ambulatory Visit: Payer: Commercial Managed Care - HMO

## 2016-05-03 VITALS — BP 142/72 | HR 72

## 2016-05-03 DIAGNOSIS — R0600 Dyspnea, unspecified: Secondary | ICD-10-CM

## 2016-05-03 DIAGNOSIS — R0689 Other abnormalities of breathing: Principal | ICD-10-CM

## 2016-05-03 DIAGNOSIS — R5381 Other malaise: Secondary | ICD-10-CM

## 2016-05-03 DIAGNOSIS — J4540 Moderate persistent asthma, uncomplicated: Secondary | ICD-10-CM

## 2016-05-03 DIAGNOSIS — I519 Heart disease, unspecified: Secondary | ICD-10-CM

## 2016-05-03 DIAGNOSIS — R768 Other specified abnormal immunological findings in serum: Secondary | ICD-10-CM

## 2016-05-03 DIAGNOSIS — I5189 Other ill-defined heart diseases: Secondary | ICD-10-CM

## 2016-05-03 LAB — NITRIC OXIDE: NITRIC OXIDE: 35

## 2016-05-03 NOTE — Progress Notes (Signed)
Subjective:     Patient ID: [name] [name], male   DOB: Jun 19, 1951, 65 y.o.   MRN: [id number]  HPI   65 yo male never smoker seen for pulmonary consult during hospitalization 11/2015 for LLL HCAP and Pleuropericarditis   01/24/2016 [facility] follow up  Pt was admitted to hospital with chest pain. Went to cath labs. Had A Fib w/ RVR post procedure.  CT chest 12/20 showed moderate pericardial effusion . , small to mod left and small right pleural effusion and patchy bilateral lower lobe opacities. 2 D echo showed mild pericardial effusion with no evidence of tamponade. Autoimmune/Connective tissue disorder workup w/ repeat Rheumatoid factor neg.  Neg ANA . Scleroderma NEG . 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 Marland Kitchen  He was treated with IV abx .  He was felt to have a pleuropericarditis ? Viral related. He improved with stress dose steroids and colchicine .  He is feeling better since discharge.  Decreased cough and dyspnea.  Still has not regained energy level.  CXR today shows improvement with residual basilar pleural thickening ? Scarring noted. Effusions resolved.     OV 05/03/2016  Chief Complaint  Patient presents with  . Follow-up    PFT results. pt states breathing is baseline since last OV. c/o sob & wheezing. denies cough or cp/tightness.      Follow-up dyspnea in the setting of long history of asthma  65 year-old morbidly obese male. He tells me and I'm seeing him for the first time. That he has a lifelong history of asthma. For the last few to several years is progressive dyspnea on exertion. In October 2016 and perhaps in December 2016 he had repeat admissions for hospital-acquired pneumonia and pericardial effusion based on review of the chart and according to his history. He says he was diuresed. He denies having a pericardiocentesis. It appears the pericardial effusion was moderate in size on a CT chest but a follow-up echo a day later in December 2016 showed that it was only small and did  not have tamponade physiology. Since then he's better but he says he set a new low baseline with last 3 dyspnea on exertion relieved by rest. Is also associated with fatigue. He believes his asthma is under control but he is taking lisinopril added by his cardiologist.  Extensive autoimmune antibody panel October 2015 shows trace positive ANA and trace positive double-stranded DNA. Repeat in December 2016 continues to show trace positive double-stranded DNA but ANA was negative.  CT scan of the chest December 13 2015 shows moderate pericardial effusion and some mild bilateral pleural effusions and compressive atelectasis I personally visualized this. Echocardiogram 12/14/2015 that followed this showed only trace pericardial effusion without any tamponade physiology    Pulmonary function test 03/29/2016 shows FEV1 1.87 L/72% FVC 2.42 L/72%. Both of post bronchodilator response. Ratio 77. TLC 4.4 L/69% - this is all consistent with restriction and his significant obesity. His DLCO is 23.8 and normal   Exhaled nitric oxide today is 35 ppb and in the gray zone. This is done while on Advair. He does have some wheezing on the right side in the base. She is    has a past medical history of Hypertension; Asthma; Atrial fibrillation ([place]); CHF (congestive heart failure) ([place]); Anemia; Heart disease; and COPD (chronic obstructive pulmonary disease) ([place]).   reports that he has never smoked. He has never used smokeless tobacco.  Past Surgical History  Procedure Laterality Date  . Esophagus surgery    .  Cardiac catheterization N/A 12/08/2015    Procedure: Left Heart Cath and Coronary Angiography;  Surgeon: Charolette Forward, MD;  Location: [place];  Service: Cardiovascular;  Laterality: N/A;    No Known Allergies  Immunization History  Administered Date(s) Administered  . Influenza,inj,Quad PF,36+ Mos 12/09/2015    Family History  Problem Relation Age of Onset  . Cancer Mother       Current outpatient prescriptions:  .  ADVAIR DISKUS 250-50 MCG/DOSE AEPB, Inhale 1 puff into the lungs 2 (two) times daily., Disp: 60 each, Rfl: 3 .  albuterol (PROVENTIL HFA;VENTOLIN HFA) 108 (90 Base) MCG/ACT inhaler, Inhale 2 puffs into the lungs every 6 (six) hours as needed., Disp: 1 Inhaler, Rfl: 5 .  albuterol (PROVENTIL) (2.5 MG/3ML) 0.083% nebulizer solution, Take 3 mLs (2.5 mg total) by nebulization every 6 (six) hours as needed for wheezing or shortness of breath., Disp: 150 mL, Rfl: 1 .  amiodarone (PACERONE) 200 MG tablet, Take 1 tablet (200 mg total) by mouth 2 (two) times daily., Disp: 60 tablet, Rfl: 2 .  aspirin EC 81 MG tablet, Take 81 mg by mouth daily.  , Disp: , Rfl:  .  atorvastatin (LIPITOR) 40 MG tablet, Take 1 tablet (40 mg total) by mouth daily at 6 PM., Disp: 30 tablet, Rfl: 3 .  clotrimazole-betamethasone (LOTRISONE) cream, Apply 1 application topically 2 (two) times daily. (Patient taking differently: Apply 1 application topically 2 (two) times daily as needed (FOR RASH). ), Disp: 30 g, Rfl: 1 .  colchicine 0.6 MG tablet, Take 1 tablet (0.6 mg total) by mouth 2 (two) times daily., Disp: 60 tablet, Rfl: 3 .  digoxin (LANOXIN) 0.125 MG tablet, Take 1 tablet (0.125 mg total) by mouth daily., Disp: 30 tablet, Rfl: 3 .  ferrous sulfate 325 (65 FE) MG tablet, Take 325 mg by mouth daily with breakfast. Reported on 03/16/2016, Disp: , Rfl:  .  furosemide (LASIX) 20 MG tablet, TAKE 1 TABLET (20 MG TOTAL) BY MOUTH DAILY., Disp: 30 tablet, Rfl: 0 .  levETIRAcetam (KEPPRA) 750 MG tablet, Take 2 tablets (1,500 mg total) by mouth 2 (two) times daily., Disp: 120 tablet, Rfl: 11 .  lisinopril (PRINIVIL,ZESTRIL) 20 MG tablet, Take 20 mg by mouth daily. , Disp: , Rfl:  .  metoprolol succinate (TOPROL XL) 25 MG 24 hr tablet, Take 1 tablet (25 mg total) by mouth daily., Disp: 30 tablet, Rfl: 3 .  nitroGLYCERIN (NITROSTAT) 0.4 MG SL tablet, Place 1 tablet (0.4 mg total) under the tongue  every 5 (five) minutes as needed for chest pain., Disp: 25 tablet, Rfl: 1 .  omeprazole (PRILOSEC) 40 MG capsule, Take 1 capsule (40 mg total) by mouth 2 (two) times daily before a meal., Disp: 60 capsule, Rfl: 3 .  pantoprazole (PROTONIX) 40 MG tablet, , Disp: , Rfl:  .  tamsulosin (FLOMAX) 0.4 MG CAPS capsule, Take 1 capsule (0.4 mg total) by mouth daily., Disp: 30 capsule, Rfl: 3 .  Vitamin D, Ergocalciferol, (DRISDOL) 50000 units CAPS capsule, Take 1 capsule (50,000 Units total) by mouth every Wednesday., Disp: 10 capsule, Rfl: 2 .  metoprolol succinate (TOPROL-XL) 50 MG 24 hr tablet, , Disp: , Rfl: 3    Review of Systems     Objective:   Physical Exam  Constitutional: He is oriented to person, place, and time. He appears well-developed and well-nourished. No distress.  HENT:  Head: Normocephalic and atraumatic.  Right Ear: External ear normal.  Left Ear: External ear normal.  Mouth/Throat: Oropharynx is clear and moist. No oropharyngeal exudate.  Eyes: Conjunctivae and EOM are normal. Pupils are equal, round, and reactive to light. Right eye exhibits no discharge. Left eye exhibits no discharge. No scleral icterus.  Neck: Normal range of motion. Neck supple. No JVD present. No tracheal deviation present. No thyromegaly present.  Cardiovascular: Normal rate, regular rhythm and intact distal pulses.  Exam reveals no gallop and no friction rub.   No murmur heard. Pulmonary/Chest: Effort normal. No respiratory distress. He has wheezes. He has no rales. He exhibits no tenderness.  Some wheezes on the right side present  Abdominal: Soft. Bowel sounds are normal. He exhibits no distension and no mass. There is no tenderness. There is no rebound and no guarding.  Musculoskeletal: Normal range of motion. He exhibits no edema or tenderness.  Lymphadenopathy:    He has no cervical adenopathy.  Neurological: He is alert and oriented to person, place, and time. He has normal reflexes. No  cranial nerve deficit. Coordination normal.  Skin: Skin is warm and dry. No rash noted. He is not diaphoretic. No erythema. No pallor.  Psychiatric: He has a normal mood and affect. His behavior is normal. Judgment and thought content normal.  Nursing note and vitals reviewed.  Filed Vitals:   05/03/16 1107  BP: 142/72  Pulse: 72  SpO2: 97%    Estimated body mass index is 39.13 kg/(m^2) as calculated from the following:   Height as of 04/20/16: 5\' 7"  (1.702 m).   Weight as of 04/20/16: 249 lb 14.4 oz (113.354 kg).      Assessment:       ICD-9-CM ICD-10-CM   1. Dyspnea and respiratory abnormality 786.09 R06.00     R06.89   2. Morbid obesity, unspecified obesity type ([place]) 278.01 E66.01   3. Physical deconditioning 799.3 R53.81   4. Diastolic dysfunction A999333 I51.9   5. Moderate persistent asthma, uncomplicated 123456 123456   6. Ds DNA antibody positive 795.79 R76.8        Plan:      - Your shortness of breath is because of all of the above reasons - Currently asthma might be a little bit active  Plan - My nurse will call Dr. Zenia Resides office today and make sure that we stop the lisinopril and he takes some other drug because the lisinopril can make asthma worse - Continue Advair for now without change - Refer pulmonary rehabilitation - Recheck ANA antibody and double-stranded DNA -> if they're positive again we will refer you to her rheumatologist Especially in the setting of pericardial effusion - Cardiac optimization per Dr. Terrence Dupont -   Follow-up - 3 months or sooner if needed  > 50% of this > 25 min visit spent in face to face counseling or coordination of care    Dr. Brand Males, M.D., [facility].C.P Pulmonary and Critical Care Medicine Staff Physician [place] [facility] Pager: [phone], If no answer or between  15:00h - 7:00h: call [phone]  05/03/2016 12:08 PM

## 2016-05-03 NOTE — Patient Instructions (Addendum)
ICD-9-CM ICD-10-CM   1. Dyspnea and respiratory abnormality 786.09 R06.00     R06.89   2. Morbid obesity, unspecified obesity type ([place]) 278.01 E66.01   3. Physical deconditioning 799.3 R53.81   4. Diastolic dysfunction A999333 I51.9   5. Moderate persistent asthma, uncomplicated 123456 123456   6. Ds DNA antibody positive 795.79 R76.8     - Your shortness of breath is because of all of the above reasons - Currently asthma might be a little bit active  Plan - My nurse will call Dr. Zenia Resides office today and make sure that we stop the lisinopril and he takes some other drug because the lisinopril can make asthma worse - Continue Advair for now without change - Refer pulmonary rehabilitation - Recheck ANA antibody and double-stranded DNA -> if they're positive again we will refer you to her rheumatologist Especially in the setting of pericardial effusion history - Cardiac optimization per Dr. Terrence Dupont -   Follow-up - 3 months or sooner if needed

## 2016-05-04 LAB — ANA: Anti Nuclear Antibody(ANA): NEGATIVE

## 2016-05-04 LAB — ANTI-DNA ANTIBODY, DOUBLE-STRANDED: DS DNA AB: 8 [IU]/mL — AB

## 2016-05-08 ENCOUNTER — Encounter (HOSPITAL_COMMUNITY)
Admission: RE | Admit: 2016-05-08 | Discharge: 2016-05-08 | Disposition: A | Discharge status: Home / Self Care | Payer: Commercial Managed Care - HMO | Source: Ambulatory Visit | Attending: Internal Medicine | Admitting: Internal Medicine

## 2016-05-08 ENCOUNTER — Encounter (HOSPITAL_COMMUNITY): Payer: Self-pay

## 2016-05-08 VITALS — BP 143/75 | HR 75 | Ht 67.0 in | Wt 248.5 lb

## 2016-05-08 DIAGNOSIS — R0689 Other abnormalities of breathing: Secondary | ICD-10-CM | POA: Insufficient documentation

## 2016-05-08 DIAGNOSIS — R0600 Dyspnea, unspecified: Secondary | ICD-10-CM | POA: Insufficient documentation

## 2016-05-08 DIAGNOSIS — J4540 Moderate persistent asthma, uncomplicated: Secondary | ICD-10-CM | POA: Insufficient documentation

## 2016-05-08 NOTE — Progress Notes (Signed)
[name] [name] 65 y.o. male Pulmonary Rehab Orientation Note Patient arrived today in Cardiac and Pulmonary Rehab for orientation to Pulmonary Rehab. He was transported from [employer] via wheel chair. He does not carry portable oxygen. Per pt, he uses oxygen never. Color good, skin warm and dry. Patient is oriented to time and place. Patient's medical history, psychosocial health, and medications reviewed. Psychosocial assessment reveals pt lives with his 60 year old god-son.. 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 Pt hobbies include going to conferences about autism and alzheimers. He is also very active doing volunteer work at schools and [employer] with troubled kids. Pt reports his stress level is low. Areas of stress/anxiety include Health.  Pt does not exhibit signs of depression.  PHQ2/9 score 0/0. Pt shows good  coping skills with positive outlook . He does not have a support system, his brothers live in [place] and [place].  His 76 year old god-son lives with him at this time, but he is not the legal guardian.  Offered emotional support and reassurance. Will continue to monitor and evaluate to see if any psychosocial issues arrive. Physical assessment reveals heart rate is normal, breath sounds clear to auscultation, no wheezes, rales, or rhonchi. Grip strength equal, strong. Distal pulses 2+ bilateral posterior tibial pulses present.   Patient reports he does take medications as prescribed. Patient states he follows a Regular diet. The patient reports no specific efforts to gain or lose weight.. Patient's weight will be monitored closely. Demonstration and practice of PLB using pulse oximeter. Patient able to return demonstration satisfactorily. Safety and hand hygiene in the exercise area reviewed with patient. Patient voices understanding of the information reviewed. Department expectations discussed with patient and achievable goals were  set. The patient shows enthusiasm about attending the program and we look forward to working with this nice gentleman. The patient is scheduled for a 6 min walk test on Thursday, May 18, 2016 @ 3:30pm and to begin exercise on Thursday, May 25, 2016 in the 1030 am class.  CP:[id number]

## 2016-05-09 DIAGNOSIS — G4733 Obstructive sleep apnea (adult) (pediatric): Secondary | ICD-10-CM | POA: Diagnosis not present

## 2016-05-09 DIAGNOSIS — J449 Chronic obstructive pulmonary disease, unspecified: Secondary | ICD-10-CM | POA: Diagnosis not present

## 2016-05-09 DIAGNOSIS — I5040 Unspecified combined systolic (congestive) and diastolic (congestive) heart failure: Secondary | ICD-10-CM | POA: Diagnosis not present

## 2016-05-18 ENCOUNTER — Encounter (HOSPITAL_COMMUNITY)
Admission: RE | Admit: 2016-05-18 | Discharge: 2016-05-18 | Disposition: A | Discharge status: Home / Self Care | Payer: Commercial Managed Care - HMO | Source: Ambulatory Visit | Attending: Internal Medicine | Admitting: Internal Medicine

## 2016-05-18 DIAGNOSIS — R0600 Dyspnea, unspecified: Secondary | ICD-10-CM | POA: Diagnosis not present

## 2016-05-18 DIAGNOSIS — R0689 Other abnormalities of breathing: Secondary | ICD-10-CM | POA: Diagnosis not present

## 2016-05-18 DIAGNOSIS — J4540 Moderate persistent asthma, uncomplicated: Secondary | ICD-10-CM | POA: Diagnosis not present

## 2016-05-19 NOTE — Progress Notes (Signed)
Pulmonary Individual Treatment Plan  Patient Details  Name: [name] [name] MRN: [id number] Date of Birth: 07-16-1951 Referring Provider:        Pulmonary Rehab Walk Test from 05/18/2016 in [place]   Referring Provider  Dr. Lavell Anchors      Initial Encounter Date:       Pulmonary Rehab Walk Test from 05/18/2016 in [place]   Date  05/18/16   Referring Provider  Dr. Lavell Anchors      Visit Diagnosis: No diagnosis found.  Patient's Home Medications on Admission:   Current outpatient prescriptions:  .  ADVAIR DISKUS 250-50 MCG/DOSE AEPB, Inhale 1 puff into the lungs 2 (two) times daily., Disp: 60 each, Rfl: 3 .  albuterol (PROVENTIL HFA;VENTOLIN HFA) 108 (90 Base) MCG/ACT inhaler, Inhale 2 puffs into the lungs every 6 (six) hours as needed., Disp: 1 Inhaler, Rfl: 5 .  albuterol (PROVENTIL) (2.5 MG/3ML) 0.083% nebulizer solution, Take 3 mLs (2.5 mg total) by nebulization every 6 (six) hours as needed for wheezing or shortness of breath., Disp: 150 mL, Rfl: 1 .  amiodarone (PACERONE) 200 MG tablet, Take 1 tablet (200 mg total) by mouth 2 (two) times daily., Disp: 60 tablet, Rfl: 2 .  aspirin EC 81 MG tablet, Take 81 mg by mouth daily.  , Disp: , Rfl:  .  atorvastatin (LIPITOR) 40 MG tablet, Take 1 tablet (40 mg total) by mouth daily at 6 PM., Disp: 30 tablet, Rfl: 3 .  clotrimazole-betamethasone (LOTRISONE) cream, Apply 1 application topically 2 (two) times daily. (Patient taking differently: Apply 1 application topically 2 (two) times daily as needed (FOR RASH). ), Disp: 30 g, Rfl: 1 .  colchicine 0.6 MG tablet, Take 1 tablet (0.6 mg total) by mouth 2 (two) times daily., Disp: 60 tablet, Rfl: 3 .  digoxin (LANOXIN) 0.125 MG tablet, Take 1 tablet (0.125 mg total) by mouth daily. (Patient not taking: Reported on 05/08/2016), Disp: 30 tablet, Rfl: 3 .  ferrous sulfate 325 (65 FE) MG tablet, Take 325 mg by mouth daily with breakfast.  Reported on 03/16/2016, Disp: , Rfl:  .  furosemide (LASIX) 20 MG tablet, TAKE 1 TABLET (20 MG TOTAL) BY MOUTH DAILY., Disp: 30 tablet, Rfl: 0 .  levETIRAcetam (KEPPRA) 750 MG tablet, Take 2 tablets (1,500 mg total) by mouth 2 (two) times daily., Disp: 120 tablet, Rfl: 11 .  lisinopril (PRINIVIL,ZESTRIL) 20 MG tablet, Take 20 mg by mouth daily. , Disp: , Rfl:  .  metoprolol succinate (TOPROL XL) 25 MG 24 hr tablet, Take 1 tablet (25 mg total) by mouth daily. (Patient not taking: Reported on 05/08/2016), Disp: 30 tablet, Rfl: 3 .  metoprolol succinate (TOPROL-XL) 50 MG 24 hr tablet, , Disp: , Rfl: 3 .  nitroGLYCERIN (NITROSTAT) 0.4 MG SL tablet, Place 1 tablet (0.4 mg total) under the tongue every 5 (five) minutes as needed for chest pain. (Patient not taking: Reported on 05/08/2016), Disp: 25 tablet, Rfl: 1 .  omeprazole (PRILOSEC) 40 MG capsule, Take 1 capsule (40 mg total) by mouth 2 (two) times daily before a meal. (Patient not taking: Reported on 05/08/2016), Disp: 60 capsule, Rfl: 3 .  pantoprazole (PROTONIX) 40 MG tablet, , Disp: , Rfl:  .  tamsulosin (FLOMAX) 0.4 MG CAPS capsule, Take 1 capsule (0.4 mg total) by mouth daily., Disp: 30 capsule, Rfl: 3 .  Vitamin D, Ergocalciferol, (DRISDOL) 50000 units CAPS capsule, Take 1 capsule (50,000 Units total) by mouth every  Wednesday., Disp: 10 capsule, Rfl: 2  Past Medical History: Past Medical History  Diagnosis Date  . Hypertension   . Asthma   . Atrial fibrillation ([place])   . CHF (congestive heart failure) ([place])   . Anemia   . Heart disease   . COPD (chronic obstructive pulmonary disease) ([facility])     Tobacco Use: History  Smoking status  . Never Smoker   Smokeless tobacco  . Never Used    Labs: Recent Review Flowsheet Data    Labs for ITP Cardiac and Pulmonary Rehab Latest Ref Rng 01/25/2014 08/24/2014 11/27/2015 12/08/2015 12/09/2015   Cholestrol 0 - 200 mg/dL 145 138 - - 144   LDLCALC 0 - 99 mg/dL 83 77 - - 76   HDL >40 mg/dL 39(L)  38(L) - - 57   Trlycerides <150 mg/dL 116 113 - - 53   Hemoglobin A1c 4.8 - 5.6 % - - - 5.8(H) -   PHART 7.350 - 7.450 - - 7.394 - -   PCO2ART 35.0 - 45.0 mmHg - - 39.0 - -   HCO3 20.0 - 24.0 mEq/L - - 23.8 - -   TCO2 0 - 100 mmol/L - - 25 23 -   ACIDBASEDEF 0.0 - 2.0 mmol/L - - 1.0 - -   O2SAT - - - 99.0 - -      Capillary Blood Glucose: Lab Results  Component Value Date   GLUCAP 71 01/23/2016   GLUCAP 78 01/24/2014     ADL UCSD:   Pulmonary Function Assessment:     Pulmonary Function Assessment - 05/08/16 1037    Breath   Bilateral Breath Sounds Clear   Shortness of Breath Fear of Shortness of Breath;Limiting activity      Exercise Target Goals: Date: 05/18/16  Exercise Program Goal: Individual exercise prescription set with THRR, safety & activity barriers. Participant demonstrates ability to understand and report RPE using BORG scale, to self-measure pulse accurately, and to acknowledge the importance of the exercise prescription.  Exercise Prescription Goal: Starting with aerobic activity 30 plus minutes a day, 3 days per week for initial exercise prescription. Provide home exercise prescription and guidelines that participant acknowledges understanding prior to discharge.  Activity Barriers & Risk Stratification:     Activity Barriers & Cardiac Risk Stratification - 05/08/16 1036    Activity Barriers & Cardiac Risk Stratification   Activity Barriers --  none      6 Minute Walk:     6 Minute Walk      05/19/16 0659       6 Minute Walk   Phase Initial     Distance 1350 feet     Walk Time 6 minutes     MPH 2.55     RPE 12     VO2 Peak 9.7     Symptoms No     Resting HR 73 bpm     Resting BP 110/80 mmHg     Max Ex. HR 107 bpm     Max Ex. BP 158/60 mmHg     Interval HR   Baseline HR 73     1 Minute HR 96     2 Minute HR 102     3 Minute HR 105     4 Minute HR 104     5 Minute HR 107     6 Minute HR 106     2 Minute Post HR 91      Interval Oxygen   Interval Oxygen?  Yes     Baseline Oxygen Saturation % 96 %     Baseline Liters of Oxygen 0 L     1 Minute Oxygen Saturation % 96 %     1 Minute Liters of Oxygen 0 L     2 Minute Oxygen Saturation % 96 %     2 Minute Liters of Oxygen 0 L     3 Minute Oxygen Saturation % 99 %     3 Minute Liters of Oxygen 0 L     4 Minute Oxygen Saturation % 98 %     4 Minute Liters of Oxygen 0 L     5 Minute Oxygen Saturation % 98 %     5 Minute Liters of Oxygen 0 L     6 Minute Oxygen Saturation % 98 %     6 Minute Liters of Oxygen 0 L     2 Minute Post Oxygen Saturation % 98 %     2 Minute Post Liters of Oxygen 0 L        Initial Exercise Prescription:     Initial Exercise Prescription - 05/19/16 0700    Date of Initial Exercise RX and Referring Provider   Date 05/18/16   Referring Provider Dr. Lavell Anchors   NuStep   Level 3   Minutes 15   METs 1.7   Arm Ergometer   Level 3   Watts 30   Minutes 15   Track   Laps 7   Minutes 15   Prescription Details   Frequency (times per week) 2   Duration Progress to 45 minutes of aerobic exercise without signs/symptoms of physical distress   Intensity   THRR 40-80% of Max Heartrate 61-122   Ratings of Perceived Exertion 11-13   Perceived Dyspnea 0-4   Progression   Progression Continue progressive overload as per policy without signs/symptoms or physical distress.   Resistance Training   Training Prescription Yes   Weight BLUE BANDS   Reps 10-12      Perform Capillary Blood Glucose checks as needed.  Exercise Prescription Changes:   Exercise Comments:   Discharge Exercise Prescription (Final Exercise Prescription Changes):    Nutrition:  Target Goals: Understanding of nutrition guidelines, daily intake of sodium 1500mg , cholesterol 200mg , calories 30% from fat and 7% or less from saturated fats, daily to have 5 or more servings of fruits and vegetables.  Biometrics:    Nutrition Therapy Plan and Nutrition  Goals:   Nutrition Discharge: Rate Your Plate Scores:   Psychosocial: Target Goals: Acknowledge presence or absence of depression, maximize coping skills, provide positive support system. Participant is able to verbalize types and ability to use techniques and skills needed for reducing stress and depression.  Initial Review & Psychosocial Screening:     Initial Psych Review & Screening - 05/08/16 [place]? No   Concerns No support system  family is in other states, does not have many close friends   Barriers   Psychosocial barriers to participate in program There are no identifiable barriers or psychosocial needs.   Screening Interventions   Interventions Encouraged to exercise      Quality of Life Scores:   PHQ-9:     Recent Review Flowsheet Data    Depression screen [facility] 2/9 05/08/2016 04/27/2016 03/23/2016 03/21/2016 03/16/2016   Decreased Interest 0 0 0 0 0   Down, Depressed, Hopeless 0 0 0 0 0   PHQ -  2 Score 0 0 0 0 0      Psychosocial Evaluation and Intervention:     Psychosocial Evaluation - 05/08/16 1055    Psychosocial Evaluation & Interventions   Interventions Encouraged to exercise with the program and follow exercise prescription   Continued Psychosocial Services Needed No      Psychosocial Re-Evaluation:  Education: Education Goals: Education classes will be provided on a weekly basis, covering required topics. Participant will state understanding/return demonstration of topics presented.  Learning Barriers/Preferences:     Learning Barriers/Preferences - 05/08/16 1036    Learning Barriers/Preferences   Learning Barriers None   Learning Preferences Written Material;Video;Verbal Instruction;Skilled Demonstration;Pictoral;Individual Instruction;Group Instruction;Computer/Internet      Education Topics: Risk Factor Reduction:  -Group instruction that is supported by a PowerPoint presentation. Instructor  discusses the definition of a risk factor, different risk factors for pulmonary disease, and how the heart and lungs work together.     Nutrition for Pulmonary Patient:  -Group instruction provided by PowerPoint slides, verbal discussion, and written materials to support subject matter. The instructor gives an explanation and review of healthy diet recommendations, which includes a discussion on weight management, recommendations for fruit and vegetable consumption, as well as protein, fluid, caffeine, fiber, sodium, sugar, and alcohol. Tips for eating when patients are short of breath are discussed.   Pursed Lip Breathing:  -Group instruction that is supported by demonstration and informational handouts. Instructor discusses the benefits of pursed lip and diaphragmatic breathing and detailed demonstration on how to preform both.     Oxygen Safety:  -Group instruction provided by PowerPoint, verbal discussion, and written material to support subject matter. There is an overview of "What is Oxygen" and "Why do we need it".  Instructor also reviews how to create a safe environment for oxygen use, the importance of using oxygen as prescribed, and the risks of noncompliance. There is a brief discussion on traveling with oxygen and resources the patient may utilize.   Oxygen Equipment:  -Group instruction provided by [facility] Staff utilizing handouts, written materials, and equipment demonstrations.   Signs and Symptoms:  -Group instruction provided by written material and verbal discussion to support subject matter. Warning signs and symptoms of infection, stroke, and heart attack are reviewed and when to call the physician/911 reinforced. Tips for preventing the spread of infection discussed.   Advanced Directives:  -Group instruction provided by verbal instruction and written material to support subject matter. Instructor reviews Advanced Directive laws and proper instruction for filling out  document.   Pulmonary Video:  -Group video education that reviews the importance of medication and oxygen compliance, exercise, good nutrition, pulmonary hygiene, and pursed lip and diaphragmatic breathing for the pulmonary patient.   Exercise for the Pulmonary Patient:  -Group instruction that is supported by a PowerPoint presentation. Instructor discusses benefits of exercise, core components of exercise, frequency, duration, and intensity of an exercise routine, importance of utilizing pulse oximetry during exercise, safety while exercising, and options of places to exercise outside of rehab.     Pulmonary Medications:  -Verbally interactive group education provided by instructor with focus on inhaled medications and proper administration.   Anatomy and Physiology of the Respiratory System and Intimacy:  -Group instruction provided by PowerPoint, verbal discussion, and written material to support subject matter. Instructor reviews respiratory cycle and anatomical components of the respiratory system and their functions. Instructor also reviews differences in obstructive and restrictive respiratory diseases with examples of each. Intimacy, Sex, and Sexuality differences are  reviewed with a discussion on how relationships can change when diagnosed with pulmonary disease. Common sexual concerns are reviewed.   Knowledge Questionnaire Score:   Core Components/Risk Factors/Patient Goals at Admission:     Personal Goals and Risk Factors at Admission - 05/08/16 1041    Core Components/Risk Factors/Patient Goals on Admission   Increase Strength and Stamina Yes   Intervention Provide advice, education, support and counseling about physical activity/exercise needs.;Develop an individualized exercise prescription for aerobic and resistive training based on initial evaluation findings, risk stratification, comorbidities and participant's personal goals.   Expected Outcomes Achievement of  increased cardiorespiratory fitness and enhanced flexibility, muscular endurance and strength shown through measurements of functional capacity and personal statement of participant.   Improve shortness of breath with ADL's Yes   Intervention Provide education, individualized exercise plan and daily activity instruction to help decrease symptoms of SOB with activities of daily living.   Expected Outcomes Short Term: Achieves a reduction of symptoms when performing activities of daily living.   Develop more efficient breathing techniques such as purse lipped breathing and diaphragmatic breathing; and practicing self-pacing with activity Yes   Intervention Provide education, demonstration and support about specific breathing techniuqes utilized for more efficient breathing. Include techniques such as pursed lipped breathing, diaphragmatic breathing and self-pacing activity.   Expected Outcomes Short Term: Participant will be able to demonstrate and use breathing techniques as needed throughout daily activities.      Core Components/Risk Factors/Patient Goals Review:      Goals and Risk Factor Review      05/08/16 1049           Core Components/Risk Factors/Patient Goals Review   Personal Goals Review Increase Strength and Stamina;Improve shortness of breath with ADL's;Develop more efficient breathing techniques such as purse lipped breathing and diaphragmatic breathing and practicing self-pacing with activity.       Expected Outcomes To increase strength, stamina and improve shortness of breath with efficient breathing techniques with education and increasing workloads on equipment.          Core Components/Risk Factors/Patient Goals at Discharge (Final Review):      Goals and Risk Factor Review - 05/08/16 1049    Core Components/Risk Factors/Patient Goals Review   Personal Goals Review Increase Strength and Stamina;Improve shortness of breath with ADL's;Develop more efficient breathing  techniques such as purse lipped breathing and diaphragmatic breathing and practicing self-pacing with activity.   Expected Outcomes To increase strength, stamina and improve shortness of breath with efficient breathing techniques with education and increasing workloads on equipment.      ITP Comments:   Comments:

## 2016-05-23 ENCOUNTER — Ambulatory Visit (INDEPENDENT_AMBULATORY_CARE_PROVIDER_SITE_OTHER): Payer: Commercial Managed Care - HMO | Admitting: Endocrinology

## 2016-05-23 ENCOUNTER — Encounter: Payer: Self-pay | Admitting: Endocrinology

## 2016-05-23 VITALS — BP 136/84 | HR 61 | Temp 98.2°F | Ht 69.0 in | Wt 244.0 lb

## 2016-05-23 DIAGNOSIS — E291 Testicular hypofunction: Secondary | ICD-10-CM

## 2016-05-23 DIAGNOSIS — N62 Hypertrophy of breast: Secondary | ICD-10-CM

## 2016-05-23 DIAGNOSIS — E559 Vitamin D deficiency, unspecified: Secondary | ICD-10-CM

## 2016-05-23 LAB — VITAMIN D 25 HYDROXY (VIT D DEFICIENCY, FRACTURES): VITD: 13.1 ng/mL — AB (ref 30.00–100.00)

## 2016-05-23 MED ORDER — VITAMIN D (ERGOCALCIFEROL) 1.25 MG (50000 UNIT) PO CAPS: 50000.0000 [IU] | ORAL_CAPSULE | ORAL | Status: AC

## 2016-05-23 NOTE — Progress Notes (Signed)
Subjective:    Patient ID: [name] [name], male    DOB: 30-Jan-1951, 65 y.o.   MRN: [id number]  HPI  The state of at least three ongoing medical problems is addressed today, with interval history of each noted here: Pt returns for f/u of idiopathic central hypogonadism (he has 1 biological child; he has never taken illicit androgens; he denies any h/o infertility; he was seen at [facility], where he was dx'ed with hypogonadotropic hypogonadism; however, pt says he wishes to f/u here in [place] instead; he was rx'ed with clomid starting in early 2015).  He takes clomid as rx'ed. Gynecomastia: he has never been on specific rx for this: pt says unchanged.  Vit-D deficiency: he denies cramps.  Past Medical History  Diagnosis Date  . Hypertension   . Asthma   . Atrial fibrillation ([place])   . CHF (congestive heart failure) ([place])   . Anemia   . Heart disease   . COPD (chronic obstructive pulmonary disease) [facility])     Past Surgical History  Procedure Laterality Date  . Esophagus surgery    . Cardiac catheterization N/A 12/08/2015    Procedure: Left Heart Cath and Coronary Angiography;  Surgeon: Charolette Forward, MD;  Location: [facility];  Service: Cardiovascular;  Laterality: N/A;    Social History   Social History  . Marital Status: Single    Spouse Name: N/A  . Number of Children: 1  . Years of Education: college   Occupational History  . [employer]    Social History Main Topics  . Smoking status: Never Smoker   . Smokeless tobacco: Never Used  . Alcohol Use: No  . Drug Use: No  . Sexual Activity: Not Currently   Other Topics Concern  . Not on file   Social History Narrative   Patient lives at home alone and he is single.   Patient is semi retired.    Education college.   Right handed.   Caffeine two cokes daily.    Current Outpatient Prescriptions on File Prior to Visit  Medication Sig Dispense Refill  . ADVAIR DISKUS 250-50 MCG/DOSE AEPB Inhale 1 puff into  the lungs 2 (two) times daily. 60 each 3  . albuterol (PROVENTIL HFA;VENTOLIN HFA) 108 (90 Base) MCG/ACT inhaler Inhale 2 puffs into the lungs every 6 (six) hours as needed. 1 Inhaler 5  . albuterol (PROVENTIL) (2.5 MG/3ML) 0.083% nebulizer solution Take 3 mLs (2.5 mg total) by nebulization every 6 (six) hours as needed for wheezing or shortness of breath. 150 mL 1  . amiodarone (PACERONE) 200 MG tablet Take 1 tablet (200 mg total) by mouth 2 (two) times daily. 60 tablet 2  . aspirin EC 81 MG tablet Take 81 mg by mouth daily.      Marland Kitchen atorvastatin (LIPITOR) 40 MG tablet Take 1 tablet (40 mg total) by mouth daily at 6 PM. 30 tablet 3  . clotrimazole-betamethasone (LOTRISONE) cream Apply 1 application topically 2 (two) times daily. (Patient taking differently: Apply 1 application topically 2 (two) times daily as needed (FOR RASH). ) 30 g 1  . colchicine 0.6 MG tablet Take 1 tablet (0.6 mg total) by mouth 2 (two) times daily. 60 tablet 3  . digoxin (LANOXIN) 0.125 MG tablet Take 1 tablet (0.125 mg total) by mouth daily. 30 tablet 3  . ferrous sulfate 325 (65 FE) MG tablet Take 325 mg by mouth daily with breakfast. Reported on 03/16/2016    . furosemide (LASIX) 20 MG  tablet TAKE 1 TABLET (20 MG TOTAL) BY MOUTH DAILY. 30 tablet 0  . levETIRAcetam (KEPPRA) 750 MG tablet Take 2 tablets (1,500 mg total) by mouth 2 (two) times daily. 120 tablet 11  . lisinopril (PRINIVIL,ZESTRIL) 20 MG tablet Take 20 mg by mouth daily.     . metoprolol succinate (TOPROL XL) 25 MG 24 hr tablet Take 1 tablet (25 mg total) by mouth daily. 30 tablet 3  . metoprolol succinate (TOPROL-XL) 50 MG 24 hr tablet   3  . nitroGLYCERIN (NITROSTAT) 0.4 MG SL tablet Place 1 tablet (0.4 mg total) under the tongue every 5 (five) minutes as needed for chest pain. 25 tablet 1  . omeprazole (PRILOSEC) 40 MG capsule Take 1 capsule (40 mg total) by mouth 2 (two) times daily before a meal. 60 capsule 3  . pantoprazole (PROTONIX) 40 MG tablet     .  tamsulosin (FLOMAX) 0.4 MG CAPS capsule Take 1 capsule (0.4 mg total) by mouth daily. 30 capsule 3   No current facility-administered medications on file prior to visit.    No Known Allergies  Family History  Problem Relation Age of Onset  . Cancer Mother     BP 136/84 mmHg  Pulse 61  Temp(Src) 98.2 F (36.8 C) (Oral)  Ht 5\' 9"  (1.753 m)  Wt 244 lb (110.678 kg)  BMI 36.02 kg/m2  SpO2 97%  Review of Systems Denies decreased urinary stream and muscle weakness    Objective:   Physical Exam VITAL SIGNS:  See vs page GENERAL: no distress Chest wall: severe bilat pseudogynecomastia.  Ext: 1+ bilat leg edema   Lab Results  Component Value Date   PSA 0.83 02/22/2016   PSA 0.45 03/03/2015   PSA 0.30 03/14/2010   Lab Results  Component Value Date   WBC 7.8 01/23/2016   HGB 10.1* 01/23/2016   HCT 33.5* 01/23/2016   MCV 66.7* 01/23/2016   PLT 454* 01/23/2016   Lab Results  Component Value Date   TSH 1.167 01/23/2016    Vit-D=13 Lab Results  Component Value Date   TESTOSTERONE 264* 05/23/2016      Assessment & Plan:  Vit-d deficiency, worse. i have sent a prescription to your pharmacy, for ergocalciferol Hypogonadism: well-controlled.  Gynecomastia/pseudogynecomastia: he may need separate rx for this.     Patient is advised the following: Patient Instructions  blood tests are requested for you today.  We'll let you know about the results.   normalization of testosterone is not known to harm you.  however, there are "theoretical" risks, including increased fertility, hair loss, prostate cancer, benign prostate enlargement, blood clots, liver problems, lower hdl ("good cholesterol"), polycythemia (opposite of anemia), sleep apnea, and behavior changes.   Please come back for a follow-up appointment in 6 months.     Renato Shin

## 2016-05-23 NOTE — Patient Instructions (Signed)
blood tests are requested for you today.  We'll let you know about the results.   normalization of testosterone is not known to harm you.  however, there are "theoretical" risks, including increased fertility, hair loss, prostate cancer, benign prostate enlargement, blood clots, liver problems, lower hdl ("good cholesterol"), polycythemia (opposite of anemia), sleep apnea, and behavior changes.   Please come back for a follow-up appointment in 6 months.

## 2016-05-24 ENCOUNTER — Other Ambulatory Visit: Payer: Self-pay

## 2016-05-24 LAB — TESTOSTERONE,FREE AND TOTAL
TESTOSTERONE FREE: 7.2 pg/mL (ref 6.6–18.1)
TESTOSTERONE: 264 ng/dL — AB (ref 348–1197)

## 2016-05-24 NOTE — Patient Outreach (Signed)
[place] [facility]) Care Management  [place]  05/24/2016   [name] [name] 08-29-1951 [id number]  Subjective: Patient reports he is doing ok except for his stamina is not where is was before he got sick. Patient reports he is starting pulmonary rehab on 05-25-16. Patient reports weight at 245 lbs. Discussed with patient heart failure zones and when to notify physician.  He verbalized understanding.   Objective:   Encounter Medications:  Outpatient Encounter Prescriptions as of 05/24/2016  Medication Sig Note  . ADVAIR DISKUS 250-50 MCG/DOSE AEPB Inhale 1 puff into the lungs 2 (two) times daily.   Marland Kitchen albuterol (PROVENTIL HFA;VENTOLIN HFA) 108 (90 Base) MCG/ACT inhaler Inhale 2 puffs into the lungs every 6 (six) hours as needed.   Marland Kitchen albuterol (PROVENTIL) (2.5 MG/3ML) 0.083% nebulizer solution Take 3 mLs (2.5 mg total) by nebulization every 6 (six) hours as needed for wheezing or shortness of breath.   Marland Kitchen amiodarone (PACERONE) 200 MG tablet Take 1 tablet (200 mg total) by mouth 2 (two) times daily.   Marland Kitchen aspirin EC 81 MG tablet Take 81 mg by mouth daily.     Marland Kitchen atorvastatin (LIPITOR) 40 MG tablet Take 1 tablet (40 mg total) by mouth daily at 6 PM.   . clotrimazole-betamethasone (LOTRISONE) cream Apply 1 application topically 2 (two) times daily. (Patient taking differently: Apply 1 application topically 2 (two) times daily as needed (FOR RASH). )   . colchicine 0.6 MG tablet Take 1 tablet (0.6 mg total) by mouth 2 (two) times daily.   . digoxin (LANOXIN) 0.125 MG tablet Take 1 tablet (0.125 mg total) by mouth daily.   . ferrous sulfate 325 (65 FE) MG tablet Take 325 mg by mouth daily with breakfast. Reported on 03/16/2016 02/07/2016: Will pick it up over the counter per patient.  . furosemide (LASIX) 20 MG tablet TAKE 1 TABLET (20 MG TOTAL) BY MOUTH DAILY.   Marland Kitchen levETIRAcetam (KEPPRA) 750 MG tablet Take 2 tablets (1,500 mg total) by mouth 2 (two) times daily. 01/06/2016: Patient called  [facility] to refill medication.  Marland Kitchen lisinopril (PRINIVIL,ZESTRIL) 20 MG tablet Take 20 mg by mouth daily.    . metoprolol succinate (TOPROL XL) 25 MG 24 hr tablet Take 1 tablet (25 mg total) by mouth daily. 05/08/2016: Changed to 50 mg recently  . metoprolol succinate (TOPROL-XL) 50 MG 24 hr tablet  05/03/2016: Received from: External Pharmacy  . nitroGLYCERIN (NITROSTAT) 0.4 MG SL tablet Place 1 tablet (0.4 mg total) under the tongue every 5 (five) minutes as needed for chest pain.   Marland Kitchen omeprazole (PRILOSEC) 40 MG capsule Take 1 capsule (40 mg total) by mouth 2 (two) times daily before a meal. 01/06/2016: Patient is taking Pantoprazole 40 mg daily.  . pantoprazole (PROTONIX) 40 MG tablet  03/08/2016: Received from: External Pharmacy  . tamsulosin (FLOMAX) 0.4 MG CAPS capsule Take 1 capsule (0.4 mg total) by mouth daily.   . Vitamin D, Ergocalciferol, (DRISDOL) 50000 units CAPS capsule Take 1 capsule (50,000 Units total) by mouth 2 (two) times a week.    No facility-administered encounter medications on file as of 05/24/2016.    Functional Status:  In your present state of health, do you have any difficulty performing the following activities: 04/27/2016 03/23/2016  Hearing? N N  Vision? N N  Difficulty concentrating or making decisions? N N  Walking or climbing stairs? N N  Dressing or bathing? N N  Doing errands, shopping? N N  Preparing  Food and eating ? N N  Using the Toilet? N N  In the past six months, have you accidently leaked urine? N N  Do you have problems with loss of bowel control? N N  Managing your Medications? N N  Managing your Finances? N N  Housekeeping or managing your Housekeeping? N N    Fall/Depression Screening: PHQ 2/9 Scores 05/24/2016 05/08/2016 04/27/2016 03/23/2016 03/21/2016 03/16/2016 02/22/2016  PHQ - 2 Score 0 0 0 0 0 0 0    Assessment: Patient continues to benefit from health coach outreach for disease management and support.  Plan:   [facility] CM Care Plan Problem One        Most Recent Value   Care Plan Problem One  Heart failure knowledge deficit   Role Documenting the Problem One  [place] for Problem One  Active   [facility] Long Term Goal (31-90 days)  Patient will be able to identify heart failure zones within 90 days.     [facility] Long Term Goal Start Date  04/27/16   Interventions for Problem One [place] reviewed with patient heart failure zones and when to notify physician.     [facility] CM Short Term Goal #4 (0-30 days)  patient will continue to weigh self and record in the next 30 days   [facility] CM Short Term Goal #4 Start Date  04/27/16   [facility] CM Short Term Goal #4 Met Date  05/24/16   Interventions for Short Term Goal #4  goal met: patient weighing regularly.     RN Health Coach will contact patient within one month and patient agrees to next outreach.  Jone Baseman, RN, MSN [place] [phone]

## 2016-05-25 ENCOUNTER — Encounter (HOSPITAL_COMMUNITY)
Admission: RE | Admit: 2016-05-25 | Discharge: 2016-05-25 | Disposition: A | Discharge status: Home / Self Care | Payer: Commercial Managed Care - HMO | Source: Ambulatory Visit | Attending: Internal Medicine | Admitting: Internal Medicine

## 2016-05-25 VITALS — Wt 244.9 lb

## 2016-05-25 DIAGNOSIS — J4540 Moderate persistent asthma, uncomplicated: Secondary | ICD-10-CM | POA: Diagnosis not present

## 2016-05-25 DIAGNOSIS — R0600 Dyspnea, unspecified: Secondary | ICD-10-CM

## 2016-05-25 DIAGNOSIS — R0689 Other abnormalities of breathing: Secondary | ICD-10-CM | POA: Diagnosis not present

## 2016-05-25 NOTE — Progress Notes (Signed)
Daily Session Note  Patient Details  Name: [name] [name] MRN: [id number] Date of Birth: [birth year] Referring Provider:        Pulmonary Rehab Walk Test [facility] 05/18/2016 in [facility]   Referring Provider  Dr. Lavell Anchors      Encounter Date: 05/25/2016  Check In:     Session Check In - 05/25/16 1042    Check-In   Location [facility]   Staff Present Rosebud Poles, RN, BSN;Lisa Ysidro Evert, RN;Portia Rollene Rotunda, RN, BSN;Ramon Dredge, RN, MHA;Izzy Doubek, MS, ACSM RCEP, Exercise Physiologist   Supervising physician immediately available to respond to emergencies [facility] immediately available   Physician(s) Dr.  Renaee Munda   Medication changes reported     No   Fall or balance concerns reported    No   Warm-up and Cool-down Performed as group-led instruction   Resistance Training Performed Yes   VAD Patient? No   Pain Assessment   Currently in Pain? No/denies   Multiple Pain Sites No      Capillary Blood Glucose: No results found for this or any previous visit (from the past 24 hour(s)).      Exercise Prescription Changes - 05/25/16 1300    Response to Exercise   Blood Pressure (Admit) 124/80 mmHg   Blood Pressure (Exercise) 128/62 mmHg   Blood Pressure (Exit) 126/80 mmHg   Heart Rate (Admit) 61 bpm   Heart Rate (Exercise) 68 bpm   Heart Rate (Exit) 59 bpm   Oxygen Saturation (Admit) 100 %   Oxygen Saturation (Exercise) 98 %   Oxygen Saturation (Exit) 98 %   Rating of Perceived Exertion (Exercise) 11   Perceived Dyspnea (Exercise) 1   Duration Progress to 45 minutes of aerobic exercise without signs/symptoms of physical distress   Intensity Rest + 40   Progression   Progression Continue to progress workloads to maintain intensity without signs/symptoms of physical distress.   Resistance Training   Training Prescription Yes   Weight BLUE BANDS   Reps 10-12   Arm Ergometer   Level 1   Watts 30   Minutes 15   Track   Laps 10   Minutes 15     Goals Met:  Exercise tolerated well No report of cardiac concerns or symptoms Strength training completed today  Goals Unmet:  Not Applicable  Comments: Service time is from 10:30am to 12:40pm    Dr. Rush Farmer is Medical Director for Pulmonary Rehab at [facility].

## 2016-05-26 ENCOUNTER — Telehealth: Payer: Self-pay | Admitting: Internal Medicine

## 2016-05-26 DIAGNOSIS — R768 Other specified abnormal immunological findings in serum: Secondary | ICD-10-CM

## 2016-05-26 NOTE — Telephone Encounter (Signed)
Please refer him to rheum in settnig of pericardial effusion and positive DS-DNA and fluctuating ANA +. Rule out SLE  Thanks  Dr. Brand Males, M.D., [facility].C.P Pulmonary and Critical Care Medicine Staff Physician [facility] Pager: [phone], If no answer or between  15:00h - 7:00h: call [phone]  05/26/2016 2:40 AM

## 2016-05-29 NOTE — Telephone Encounter (Signed)
Spoke with pt, aware of results/recs.  Referral placed to rheumatology.  Nothing further needed.

## 2016-05-30 ENCOUNTER — Encounter (HOSPITAL_COMMUNITY)
Admission: RE | Admit: 2016-05-30 | Discharge: 2016-05-30 | Disposition: A | Discharge status: Home / Self Care | Payer: Commercial Managed Care - HMO | Source: Ambulatory Visit | Attending: Internal Medicine | Admitting: Internal Medicine

## 2016-05-30 DIAGNOSIS — R0600 Dyspnea, unspecified: Secondary | ICD-10-CM | POA: Diagnosis not present

## 2016-05-30 DIAGNOSIS — J4540 Moderate persistent asthma, uncomplicated: Secondary | ICD-10-CM | POA: Diagnosis not present

## 2016-05-30 DIAGNOSIS — R0689 Other abnormalities of breathing: Secondary | ICD-10-CM | POA: Diagnosis not present

## 2016-05-30 NOTE — Progress Notes (Signed)
Daily Session Note  Patient Details  Name: [name] [name] MRN: [id number] Date of Birth: Jun 01, 1951 Referring Provider:        Pulmonary Rehab Walk Test from 05/18/2016 in [facility]   Referring Provider  Dr. Lavell Anchors      Encounter Date: 05/30/2016  Check In:   Capillary Blood Glucose: No results found for this or any previous visit (from the past 24 hour(s)).      Exercise Prescription Changes - 05/30/16 1200    Response to Exercise   Blood Pressure (Admit) 124/62 mmHg   Blood Pressure (Exercise) 134/76 mmHg   Blood Pressure (Exit) 108/76 mmHg   Heart Rate (Admit) 69 bpm   Heart Rate (Exercise) 73 bpm   Heart Rate (Exit) 63 bpm   Oxygen Saturation (Admit) 96 %   Oxygen Saturation (Exercise) 96 %   Oxygen Saturation (Exit) 96 %   Rating of Perceived Exertion (Exercise) 11   Perceived Dyspnea (Exercise) 0   Duration Progress to 45 minutes of aerobic exercise without signs/symptoms of physical distress   Intensity Rest + 40   Progression   Progression Continue to progress workloads to maintain intensity without signs/symptoms of physical distress.   Resistance Training   Training Prescription Yes   Weight BLUE BANDS   Reps 10-12   NuStep   Level 3   Minutes 15   METs 1.6   Arm Ergometer   Level 3   Watts 30   Minutes 15   Track   Laps 10   Minutes 15     Goals Met:  Exercise tolerated well No report of cardiac concerns or symptoms Strength training completed today  Goals Unmet:  Not Applicable  Comments: Service time is from 10:30AM to 12:00PM    Dr. Rush Farmer is Medical Director for Pulmonary Rehab at [facility].

## 2016-05-31 LAB — ESTRADIOL, FREE
ESTRADIOL: 55 pg/mL — AB (ref <=29)
Estradiol, Free: 1.23 pg/mL — ABNORMAL HIGH (ref <=0.45)

## 2016-06-01 ENCOUNTER — Encounter (HOSPITAL_COMMUNITY)
Admission: RE | Admit: 2016-06-01 | Discharge: 2016-06-01 | Disposition: A | Discharge status: Home / Self Care | Payer: Commercial Managed Care - HMO | Source: Ambulatory Visit | Attending: Internal Medicine | Admitting: Internal Medicine

## 2016-06-01 ENCOUNTER — Other Ambulatory Visit: Payer: Self-pay | Admitting: Endocrinology

## 2016-06-01 VITALS — Wt 239.2 lb

## 2016-06-01 DIAGNOSIS — J4540 Moderate persistent asthma, uncomplicated: Secondary | ICD-10-CM | POA: Diagnosis not present

## 2016-06-01 DIAGNOSIS — R0689 Other abnormalities of breathing: Secondary | ICD-10-CM | POA: Diagnosis not present

## 2016-06-01 DIAGNOSIS — R0600 Dyspnea, unspecified: Secondary | ICD-10-CM | POA: Diagnosis not present

## 2016-06-01 MED ORDER — TAMOXIFEN CITRATE 10 MG PO TABS: 10.0000 mg | ORAL_TABLET | Freq: Every day | ORAL | Status: DC

## 2016-06-01 MED ORDER — CLOMIPHENE CITRATE 50 MG PO TABS: ORAL_TABLET | ORAL | Status: DC

## 2016-06-01 NOTE — Progress Notes (Signed)
Daily Session Note  Patient Details  Name: [name] [name] MRN: [id number] Date of Birth: 12/25/1951 Referring Provider:        Pulmonary Rehab Walk Test from 05/18/2016 in [facility]   Referring Provider  Dr. Lavell Anchors      Encounter Date: 06/01/2016  Check In:     Session Check In - 06/01/16 1045    Check-In   [facility]   Staff Present Rosebud Poles, RN, BSN;Molly diVincenzo, MS, ACSM RCEP, Exercise Physiologist;Lisa Ysidro Evert, Felipe Drone, RN, MHA;Portia Rollene Rotunda, RN, BSN   Supervising physician immediately available to respond to emergencies [facility] immediately available   Physician(s) Dr. Marily Memos   Medication changes reported     No   Fall or balance concerns reported    No   Warm-up and Cool-down Performed as group-led instruction   Resistance Training Performed Yes   VAD Patient? No   Pain Assessment   Currently in Pain? No/denies   Multiple Pain Sites No      Capillary Blood Glucose: No results found for this or any previous visit (from the past 24 hour(s)).      Exercise Prescription Changes - 06/01/16 1200    Response to Exercise   Blood Pressure (Admit) 102/64 mmHg   Blood Pressure (Exercise) 126/84 mmHg   Blood Pressure (Exit) 118/80 mmHg   Heart Rate (Admit) 62 bpm   Heart Rate (Exercise) 66 bpm   Heart Rate (Exit) 64 bpm   Oxygen Saturation (Admit) 98 %   Oxygen Saturation (Exercise) 97 %   Oxygen Saturation (Exit) 99 %   Rating of Perceived Exertion (Exercise) 11   Perceived Dyspnea (Exercise) 1   Duration Progress to 45 minutes of aerobic exercise without signs/symptoms of physical distress   Intensity THRR unchanged   Progression   Progression Continue to progress workloads to maintain intensity without signs/symptoms of physical distress.   Resistance Training   Training Prescription Yes   Weight BLUE BANDS   Reps 10-12   Interval Training   Interval Training No   NuStep    Level 3   Minutes 15   METs 1.8   Track   Laps 11   Minutes 15     Goals Met:  Exercise tolerated well Queuing for purse lip breathing Strength training completed today  Goals Unmet:  Not Applicable  Comments: Service time is from 1030 to 1145    Dr. Rush Farmer is Medical Director for Pulmonary Rehab at [facility].

## 2016-06-05 DIAGNOSIS — E785 Hyperlipidemia, unspecified: Secondary | ICD-10-CM | POA: Diagnosis not present

## 2016-06-05 DIAGNOSIS — I10 Essential (primary) hypertension: Secondary | ICD-10-CM | POA: Diagnosis not present

## 2016-06-05 DIAGNOSIS — I4891 Unspecified atrial fibrillation: Secondary | ICD-10-CM | POA: Diagnosis not present

## 2016-06-05 DIAGNOSIS — D649 Anemia, unspecified: Secondary | ICD-10-CM | POA: Diagnosis not present

## 2016-06-06 ENCOUNTER — Encounter (HOSPITAL_COMMUNITY)
Admission: RE | Admit: 2016-06-06 | Discharge: 2016-06-06 | Disposition: A | Discharge status: Home / Self Care | Payer: Commercial Managed Care - HMO | Source: Ambulatory Visit | Attending: Internal Medicine | Admitting: Internal Medicine

## 2016-06-06 VITALS — Wt 239.0 lb

## 2016-06-06 DIAGNOSIS — J4540 Moderate persistent asthma, uncomplicated: Secondary | ICD-10-CM | POA: Diagnosis not present

## 2016-06-06 DIAGNOSIS — R0600 Dyspnea, unspecified: Secondary | ICD-10-CM | POA: Diagnosis not present

## 2016-06-06 DIAGNOSIS — R0689 Other abnormalities of breathing: Secondary | ICD-10-CM | POA: Diagnosis not present

## 2016-06-06 NOTE — Progress Notes (Signed)
Daily Session Note  Patient Details  Name: [name] [name] MRN: [id number] Date of Birth: 20-Jun-1951 Referring Provider:        Pulmonary Rehab Walk Test from 05/18/2016 in [facility]   Referring Provider  Dr. Lavell Anchors      Encounter Date: 06/06/2016  Check In:     Session Check In - 06/06/16 1113    Check-In   [facility]   Staff Present Rosebud Poles, RN, BSN;Lisa Ysidro Evert, Felipe Drone, RN, MHA;Portia Rollene Rotunda, RN, BSN;Kaoru Rezendes, MS, ACSM RCEP, Exercise Physiologist   Supervising physician immediately available to respond to emergencies [facility] immediately available   Physician(s) Dr. Marily Memos   Medication changes reported     No   Fall or balance concerns reported    No   Warm-up and Cool-down Performed as group-led instruction   Resistance Training Performed Yes   VAD Patient? No   Pain Assessment   Currently in Pain? No/denies   Multiple Pain Sites No      Capillary Blood Glucose: No results found for this or any previous visit (from the past 24 hour(s)).      Exercise Prescription Changes - 06/06/16 1200    Exercise Review   Progression Yes   Response to Exercise   Blood Pressure (Admit) 136/76 mmHg   Blood Pressure (Exercise) 150/76 mmHg   Blood Pressure (Exit) 110/72 mmHg   Heart Rate (Admit) 64 bpm   Heart Rate (Exercise) 90 bpm   Heart Rate (Exit) 65 bpm   Oxygen Saturation (Admit) 99 %   Oxygen Saturation (Exercise) 96 %   Oxygen Saturation (Exit) 95 %   Rating of Perceived Exertion (Exercise) 11   Perceived Dyspnea (Exercise) 1   Duration Progress to 45 minutes of aerobic exercise without signs/symptoms of physical distress   Intensity THRR unchanged   Progression   Progression Continue to progress workloads to maintain intensity without signs/symptoms of physical distress.   Resistance Training   Training Prescription Yes   Weight BLUE BANDS   Reps 10-12   Interval  Training   Interval Training No   NuStep   Level 4   Minutes 15   METs 1.7   Arm Ergometer   Level 3   Minutes 15   Track   Laps 10   Minutes 15     Goals Met:  Exercise tolerated well No report of cardiac concerns or symptoms Strength training completed today  Goals Unmet:  Not Applicable  Comments: Service time is from 10:30AM to 12:00PM    Dr. Rush Farmer is Medical Director for Pulmonary Rehab at [facility].

## 2016-06-07 ENCOUNTER — Ambulatory Visit: Payer: Self-pay | Admitting: Nurse Practitioner

## 2016-06-08 ENCOUNTER — Encounter (HOSPITAL_COMMUNITY)
Admission: RE | Admit: 2016-06-08 | Discharge: 2016-06-08 | Disposition: A | Discharge status: Home / Self Care | Payer: Commercial Managed Care - HMO | Source: Ambulatory Visit | Attending: Internal Medicine | Admitting: Internal Medicine

## 2016-06-08 VITALS — Wt 236.1 lb

## 2016-06-08 DIAGNOSIS — R0600 Dyspnea, unspecified: Secondary | ICD-10-CM | POA: Diagnosis not present

## 2016-06-08 DIAGNOSIS — R0689 Other abnormalities of breathing: Secondary | ICD-10-CM | POA: Diagnosis not present

## 2016-06-08 DIAGNOSIS — J4540 Moderate persistent asthma, uncomplicated: Secondary | ICD-10-CM | POA: Diagnosis not present

## 2016-06-08 NOTE — Progress Notes (Signed)
[name] [name] 65 y.o. male Nutrition Note Spoke with pt. Pt has not returned his pre-rehab nutrition information. Pre-rehab nutrition information completed with pt. Pt is obese. Pt eats 2 meals a day "on average"; most prepared at home. There are many ways the pt can make his eating habits healthier. Ways to make healthier food choices discussed when pt's Rate Your Plate results reviewed with pt. Pt does not specifically avoid salty food. Pt states "I never really liked salt so I never added it." Sodium sources in the diet discussed. Pt does not use canned/ convenience food often.  Pt does not add salt to food.  The role of sodium in lung disease reviewed with pt. Pt's last A1c/pre-diabetes noted. Pt expressed understanding of the information reviewed via feedback method.    Lab Results  Component Value Date   HGBA1C 5.8* 12/08/2015    Nutrition Diagnosis ? Food-and nutrition-related knowledge deficit related to lack of exposure to information as related to diagnosis of pulmonary disease ? Obesity related to excessive energy intake as evidenced by a BMI of 36.1 Nutrition Intervention ? Pt's individual nutrition plan and goals reviewed with pt. ? Benefits of adopting healthy eating habits discussed when pt's Rate Your Plate reviewed. ? Pt to attend the Nutrition and Lung Disease class ? Continual client-centered nutrition education by RD, as part of interdisciplinary care. Goal(s) 1. Identify food quantities necessary to achieve wt loss of  -2# per week to a goal wt loss of 2.7-10.9 kg (6-24 lb) at graduation from pulmonary rehab. 2. Describe the benefit of including fruits, vegetables, whole grains, and low-fat dairy products in a healthy meal plan. Monitor and Evaluate progress toward nutrition goal with team.   Derek Mound, M.Ed, RD, LDN, CDE 06/08/2016 12:57 PM

## 2016-06-08 NOTE — Progress Notes (Signed)
Pulmonary Individual Treatment Plan  Patient Details  Name: [name] [name] MRN: [id number] Date of Birth: 15-Jun-1951 Referring Provider:        Pulmonary Rehab Walk Test from 05/18/2016 in [place]   Referring Provider  Dr. Lavell Anchors      Initial Encounter Date:       Pulmonary Rehab Walk Test from 05/18/2016 in [place]   Date  05/18/16   Referring Provider  Dr. Lavell Anchors      Visit Diagnosis: Dyspnea  Patient's Home Medications on Admission:   Current outpatient prescriptions:  .  ADVAIR DISKUS 250-50 MCG/DOSE AEPB, Inhale 1 puff into the lungs 2 (two) times daily., Disp: 60 each, Rfl: 3 .  albuterol (PROVENTIL HFA;VENTOLIN HFA) 108 (90 Base) MCG/ACT inhaler, Inhale 2 puffs into the lungs every 6 (six) hours as needed., Disp: 1 Inhaler, Rfl: 5 .  albuterol (PROVENTIL) (2.5 MG/3ML) 0.083% nebulizer solution, Take 3 mLs (2.5 mg total) by nebulization every 6 (six) hours as needed for wheezing or shortness of breath., Disp: 150 mL, Rfl: 1 .  amiodarone (PACERONE) 200 MG tablet, Take 1 tablet (200 mg total) by mouth 2 (two) times daily., Disp: 60 tablet, Rfl: 2 .  aspirin EC 81 MG tablet, Take 81 mg by mouth daily.  , Disp: , Rfl:  .  atorvastatin (LIPITOR) 40 MG tablet, Take 1 tablet (40 mg total) by mouth daily at 6 PM., Disp: 30 tablet, Rfl: 3 .  clomiPHENE (CLOMID) 50 MG tablet, 1/4 tab daily, Disp: 10 tablet, Rfl: 5 .  clotrimazole-betamethasone (LOTRISONE) cream, Apply 1 application topically 2 (two) times daily. (Patient taking differently: Apply 1 application topically 2 (two) times daily as needed (FOR RASH). ), Disp: 30 g, Rfl: 1 .  colchicine 0.6 MG tablet, Take 1 tablet (0.6 mg total) by mouth 2 (two) times daily., Disp: 60 tablet, Rfl: 3 .  digoxin (LANOXIN) 0.125 MG tablet, Take 1 tablet (0.125 mg total) by mouth daily., Disp: 30 tablet, Rfl: 3 .  ferrous sulfate 325 (65 FE) MG tablet, Take 325 mg by mouth  daily with breakfast. Reported on 03/16/2016, Disp: , Rfl:  .  furosemide (LASIX) 20 MG tablet, TAKE 1 TABLET (20 MG TOTAL) BY MOUTH DAILY., Disp: 30 tablet, Rfl: 0 .  levETIRAcetam (KEPPRA) 750 MG tablet, Take 2 tablets (1,500 mg total) by mouth 2 (two) times daily., Disp: 120 tablet, Rfl: 11 .  lisinopril (PRINIVIL,ZESTRIL) 20 MG tablet, Take 20 mg by mouth daily. , Disp: , Rfl:  .  metoprolol succinate (TOPROL XL) 25 MG 24 hr tablet, Take 1 tablet (25 mg total) by mouth daily., Disp: 30 tablet, Rfl: 3 .  metoprolol succinate (TOPROL-XL) 50 MG 24 hr tablet, , Disp: , Rfl: 3 .  nitroGLYCERIN (NITROSTAT) 0.4 MG SL tablet, Place 1 tablet (0.4 mg total) under the tongue every 5 (five) minutes as needed for chest pain., Disp: 25 tablet, Rfl: 1 .  omeprazole (PRILOSEC) 40 MG capsule, Take 1 capsule (40 mg total) by mouth 2 (two) times daily before a meal., Disp: 60 capsule, Rfl: 3 .  pantoprazole (PROTONIX) 40 MG tablet, , Disp: , Rfl:  .  tamoxifen (NOLVADEX) 10 MG tablet, Take 1 tablet (10 mg total) by mouth daily., Disp: 30 tablet, Rfl: 11 .  tamsulosin (FLOMAX) 0.4 MG CAPS capsule, Take 1 capsule (0.4 mg total) by mouth daily., Disp: 30 capsule, Rfl: 3 .  Vitamin D, Ergocalciferol, (DRISDOL) 50000 units CAPS  capsule, Take 1 capsule (50,000 Units total) by mouth 2 (two) times a week., Disp: 10 capsule, Rfl: 0  Past Medical History: Past Medical History  Diagnosis Date  . Hypertension   . Asthma   . Atrial fibrillation ([place])   . CHF (congestive heart failure) ([place])   . Anemia   . Heart disease   . COPD (chronic obstructive pulmonary disease) ([facility])     Tobacco Use: History  Smoking status  . Never Smoker   Smokeless tobacco  . Never Used    Labs: Recent Review Flowsheet Data    Labs for ITP Cardiac and Pulmonary Rehab Latest Ref Rng 01/25/2014 08/24/2014 11/27/2015 12/08/2015 12/09/2015   Cholestrol 0 - 200 mg/dL 145 138 - - 144   LDLCALC 0 - 99 mg/dL 83 77 - - 76   HDL >40 mg/dL 39(L)  38(L) - - 57   Trlycerides <150 mg/dL 116 113 - - 53   Hemoglobin A1c 4.8 - 5.6 % - - - 5.8(H) -   PHART 7.350 - 7.450 - - 7.394 - -   PCO2ART 35.0 - 45.0 mmHg - - 39.0 - -   HCO3 20.0 - 24.0 mEq/L - - 23.8 - -   TCO2 0 - 100 mmol/L - - 25 23 -   ACIDBASEDEF 0.0 - 2.0 mmol/L - - 1.0 - -   O2SAT - - - 99.0 - -      Capillary Blood Glucose: Lab Results  Component Value Date   GLUCAP 71 01/23/2016   GLUCAP 78 01/24/2014     ADL UCSD:   Pulmonary Function Assessment:     Pulmonary Function Assessment - 05/08/16 1037    Breath   Bilateral Breath Sounds Clear   Shortness of Breath Fear of Shortness of Breath;Limiting activity      Exercise Target Goals:    Exercise Program Goal: Individual exercise prescription set with THRR, safety & activity barriers. Participant demonstrates ability to understand and report RPE using BORG scale, to self-measure pulse accurately, and to acknowledge the importance of the exercise prescription.  Exercise Prescription Goal: Starting with aerobic activity 30 plus minutes a day, 3 days per week for initial exercise prescription. Provide home exercise prescription and guidelines that participant acknowledges understanding prior to discharge.  Activity Barriers & Risk Stratification:     Activity Barriers & Cardiac Risk Stratification - 05/08/16 1036    Activity Barriers & Cardiac Risk Stratification   Activity Barriers --  none      6 Minute Walk:     6 Minute Walk      05/19/16 0659       6 Minute Walk   Phase Initial     Distance 1350 feet     Walk Time 6 minutes     MPH 2.55     RPE 12     VO2 Peak 9.7     Symptoms No     Resting HR 73 bpm     Resting BP 110/80 mmHg     Max Ex. HR 107 bpm     Max Ex. BP 158/60 mmHg     Interval HR   Baseline HR 73     1 Minute HR 96     2 Minute HR 102     3 Minute HR 105     4 Minute HR 104     5 Minute HR 107     6 Minute HR 106     2 Minute Post  HR 91     Interval Oxygen    Interval Oxygen? Yes     Baseline Oxygen Saturation % 96 %     Baseline Liters of Oxygen 0 L     1 Minute Oxygen Saturation % 96 %     1 Minute Liters of Oxygen 0 L     2 Minute Oxygen Saturation % 96 %     2 Minute Liters of Oxygen 0 L     3 Minute Oxygen Saturation % 99 %     3 Minute Liters of Oxygen 0 L     4 Minute Oxygen Saturation % 98 %     4 Minute Liters of Oxygen 0 L     5 Minute Oxygen Saturation % 98 %     5 Minute Liters of Oxygen 0 L     6 Minute Oxygen Saturation % 98 %     6 Minute Liters of Oxygen 0 L     2 Minute Post Oxygen Saturation % 98 %     2 Minute Post Liters of Oxygen 0 L        Initial Exercise Prescription:     Initial Exercise Prescription - 05/19/16 0700    Date of Initial Exercise RX and Referring Provider   Date 05/18/16   Referring Provider Dr. Lavell Anchors   NuStep   Level 3   Minutes 15   METs 1.7   Arm Ergometer   Level 3   Watts 30   Minutes 15   Track   Laps 7   Minutes 15   Prescription Details   Frequency (times per week) 2   Duration Progress to 45 minutes of aerobic exercise without signs/symptoms of physical distress   Intensity   THRR 40-80% of Max Heartrate 61-122   Ratings of Perceived Exertion 11-13   Perceived Dyspnea 0-4   Progression   Progression Continue progressive overload as per policy without signs/symptoms or physical distress.   Resistance Training   Training Prescription Yes   Weight BLUE BANDS   Reps 10-12      Perform Capillary Blood Glucose checks as needed.  Exercise Prescription Changes:     Exercise Prescription Changes      05/25/16 1300 05/30/16 1200 06/01/16 1200 06/06/16 1200     Exercise Review   Progression    Yes    Response to Exercise   Blood Pressure (Admit) 124/80 mmHg 124/62 mmHg 102/64 mmHg 136/76 mmHg    Blood Pressure (Exercise) 128/62 mmHg 134/76 mmHg 126/84 mmHg 150/76 mmHg    Blood Pressure (Exit) 126/80 mmHg 108/76 mmHg 118/80 mmHg 110/72 mmHg    Heart Rate (Admit) 61  bpm 69 bpm 62 bpm 64 bpm    Heart Rate (Exercise) 68 bpm 73 bpm 66 bpm 90 bpm    Heart Rate (Exit) 59 bpm 63 bpm 64 bpm 65 bpm    Oxygen Saturation (Admit) 100 % 96 % 98 % 99 %    Oxygen Saturation (Exercise) 98 % 96 % 97 % 96 %    Oxygen Saturation (Exit) 98 % 96 % 99 % 95 %    Rating of Perceived Exertion (Exercise) 11 11 11 11     Perceived Dyspnea (Exercise) 1 0 1 1    Duration Progress to 45 minutes of aerobic exercise without signs/symptoms of physical distress Progress to 45 minutes of aerobic exercise without signs/symptoms of physical distress Progress to 45 minutes of aerobic exercise without signs/symptoms of physical distress  Progress to 45 minutes of aerobic exercise without signs/symptoms of physical distress    Intensity Rest + 40 Rest + 40 THRR unchanged THRR unchanged    Progression   Progression Continue to progress workloads to maintain intensity without signs/symptoms of physical distress. Continue to progress workloads to maintain intensity without signs/symptoms of physical distress. Continue to progress workloads to maintain intensity without signs/symptoms of physical distress. Continue to progress workloads to maintain intensity without signs/symptoms of physical distress.    Resistance Training   Training Prescription Yes Yes Yes Yes    Weight BLUE BANDS BLUE BANDS BLUE BANDS BLUE BANDS    Reps 10-12 10-12 10-12 10-12    Interval Training   Interval Training   No No    NuStep   Level  3 3 4     Minutes  15 15 15     METs  1.6 1.8 1.7    Arm Ergometer   Level 1 3  3     Watts 30 30      Minutes 15 15  15     Track   Laps 10 10 11 10     Minutes 15 15 15 15        Exercise Comments:     Exercise Comments      06/08/16 0746           Exercise Comments Patient has attended three exercise sessions. Has had slight increases. Will cont. to monitor exercise progression.           Discharge Exercise Prescription (Final Exercise Prescription Changes):      Exercise Prescription Changes - 06/06/16 1200    Exercise Review   Progression Yes   Response to Exercise   Blood Pressure (Admit) 136/76 mmHg   Blood Pressure (Exercise) 150/76 mmHg   Blood Pressure (Exit) 110/72 mmHg   Heart Rate (Admit) 64 bpm   Heart Rate (Exercise) 90 bpm   Heart Rate (Exit) 65 bpm   Oxygen Saturation (Admit) 99 %   Oxygen Saturation (Exercise) 96 %   Oxygen Saturation (Exit) 95 %   Rating of Perceived Exertion (Exercise) 11   Perceived Dyspnea (Exercise) 1   Duration Progress to 45 minutes of aerobic exercise without signs/symptoms of physical distress   Intensity THRR unchanged   Progression   Progression Continue to progress workloads to maintain intensity without signs/symptoms of physical distress.   Resistance Training   Training Prescription Yes   Weight BLUE BANDS   Reps 10-12   Interval Training   Interval Training No   NuStep   Level 4   Minutes 15   METs 1.7   Arm Ergometer   Level 3   Minutes 15   Track   Laps 10   Minutes 15       Nutrition:  Target Goals: Understanding of nutrition guidelines, daily intake of sodium 1500mg , cholesterol 200mg , calories 30% from fat and 7% or less from saturated fats, daily to have 5 or more servings of fruits and vegetables.  Biometrics:    Nutrition Therapy Plan and Nutrition Goals:   Nutrition Discharge: Rate Your Plate Scores:   Psychosocial: Target Goals: Acknowledge presence or absence of depression, maximize coping skills, provide positive support system. Participant is able to verbalize types and ability to use techniques and skills needed for reducing stress and depression.  Initial Review & Psychosocial Screening:     Initial Psych Review & Screening - 05/08/16 [place]? No  Concerns No support system  family is in other states, does not have many close friends   Barriers   Psychosocial barriers to participate in program There are no  identifiable barriers or psychosocial needs.   Screening Interventions   Interventions Encouraged to exercise      Quality of Life Scores:   PHQ-9:     Recent Review Flowsheet Data    Depression screen [facility] 2/9 05/24/2016 05/08/2016 04/27/2016 03/23/2016 03/21/2016   Decreased Interest 0 0 0 0 0   Down, Depressed, Hopeless 0 0 0 0 0   PHQ - 2 Score 0 0 0 0 0      Psychosocial Evaluation and Intervention:     Psychosocial Evaluation - 05/08/16 1055    Psychosocial Evaluation & Interventions   Interventions Encouraged to exercise with the program and follow exercise prescription   Continued Psychosocial Services Needed No      Psychosocial Re-Evaluation:     Psychosocial Re-Evaluation      06/08/16 1149           Psychosocial Re-Evaluation   Interventions Encouraged to attend Pulmonary Rehabilitation for the exercise       Comments patient continues to have flat affect. RN continues to offer support and referral to spiritual care however pt states he is ok.       Continued Psychosocial Services Needed Yes         Education: Education Goals: Education classes will be provided on a weekly basis, covering required topics. Participant will state understanding/return demonstration of topics presented.  Learning Barriers/Preferences:     Learning Barriers/Preferences - 05/08/16 1036    Learning Barriers/Preferences   Learning Barriers None   Learning Preferences Written Material;Video;Verbal Instruction;Skilled Demonstration;Pictoral;Individual Instruction;Group Instruction;Computer/Internet      Education Topics: Risk Factor Reduction:  -Group instruction that is supported by a PowerPoint presentation. Instructor discusses the definition of a risk factor, different risk factors for pulmonary disease, and how the heart and lungs work together.     Nutrition for Pulmonary Patient:  -Group instruction provided by PowerPoint slides, verbal discussion, and written  materials to support subject matter. The instructor gives an explanation and review of healthy diet recommendations, which includes a discussion on weight management, recommendations for fruit and vegetable consumption, as well as protein, fluid, caffeine, fiber, sodium, sugar, and alcohol. Tips for eating when patients are short of breath are discussed.          PULMONARY REHAB OTHER RESPIRATORY from 06/01/2016 in [place]   Date  05/25/16   Educator  RD   Instruction Review Code  2- meets goals/outcomes      Pursed Lip Breathing:  -Group instruction that is supported by demonstration and informational handouts. Instructor discusses the benefits of pursed lip and diaphragmatic breathing and detailed demonstration on how to preform both.     Oxygen Safety:  -Group instruction provided by PowerPoint, verbal discussion, and written material to support subject matter. There is an overview of "What is Oxygen" and "Why do we need it".  Instructor also reviews how to create a safe environment for oxygen use, the importance of using oxygen as prescribed, and the risks of noncompliance. There is a brief discussion on traveling with oxygen and resources the patient may utilize.   Oxygen Equipment:  -Group instruction provided by [facility] Staff utilizing handouts, written materials, and equipment demonstrations.   Signs and Symptoms:  -Group instruction provided by written material and verbal discussion to  support subject matter. Warning signs and symptoms of infection, stroke, and heart attack are reviewed and when to call the physician/911 reinforced. Tips for preventing the spread of infection discussed.   Advanced Directives:  -Group instruction provided by verbal instruction and written material to support subject matter. Instructor reviews Advanced Directive laws and proper instruction for filling out document.   Pulmonary Video:  -Group video education  that reviews the importance of medication and oxygen compliance, exercise, good nutrition, pulmonary hygiene, and pursed lip and diaphragmatic breathing for the pulmonary patient.      PULMONARY REHAB OTHER RESPIRATORY from 06/01/2016 in [place]   Date  06/01/16   Instruction Review Code  2- meets goals/outcomes      Exercise for the Pulmonary Patient:  -Group instruction that is supported by a PowerPoint presentation. Instructor discusses benefits of exercise, core components of exercise, frequency, duration, and intensity of an exercise routine, importance of utilizing pulse oximetry during exercise, safety while exercising, and options of places to exercise outside of rehab.     Pulmonary Medications:  -Verbally interactive group education provided by instructor with focus on inhaled medications and proper administration.   Anatomy and Physiology of the Respiratory System and Intimacy:  -Group instruction provided by PowerPoint, verbal discussion, and written material to support subject matter. Instructor reviews respiratory cycle and anatomical components of the respiratory system and their functions. Instructor also reviews differences in obstructive and restrictive respiratory diseases with examples of each. Intimacy, Sex, and Sexuality differences are reviewed with a discussion on how relationships can change when diagnosed with pulmonary disease. Common sexual concerns are reviewed.   Knowledge Questionnaire Score:   Core Components/Risk Factors/Patient Goals at Admission:     Personal Goals and Risk Factors at Admission - 05/08/16 1041    Core Components/Risk Factors/Patient Goals on Admission   Increase Strength and Stamina Yes   Intervention Provide advice, education, support and counseling about physical activity/exercise needs.;Develop an individualized exercise prescription for aerobic and resistive training based on initial evaluation  findings, risk stratification, comorbidities and participant's personal goals.   Expected Outcomes Achievement of increased cardiorespiratory fitness and enhanced flexibility, muscular endurance and strength shown through measurements of functional capacity and personal statement of participant.   Improve shortness of breath with ADL's Yes   Intervention Provide education, individualized exercise plan and daily activity instruction to help decrease symptoms of SOB with activities of daily living.   Expected Outcomes Short Term: Achieves a reduction of symptoms when performing activities of daily living.   Develop more efficient breathing techniques such as purse lipped breathing and diaphragmatic breathing; and practicing self-pacing with activity Yes   Intervention Provide education, demonstration and support about specific breathing techniuqes utilized for more efficient breathing. Include techniques such as pursed lipped breathing, diaphragmatic breathing and self-pacing activity.   Expected Outcomes Short Term: Participant will be able to demonstrate and use breathing techniques as needed throughout daily activities.      Core Components/Risk Factors/Patient Goals Review:      Goals and Risk Factor Review      05/08/16 1049 06/08/16 1148         Core Components/Risk Factors/Patient Goals Review   Personal Goals Review Increase Strength and Stamina;Improve shortness of breath with ADL's;Develop more efficient breathing techniques such as purse lipped breathing and diaphragmatic breathing and practicing self-pacing with activity. Increase Strength and Stamina;Improve shortness of breath with ADL's;Develop more efficient breathing techniques such as purse lipped breathing and diaphragmatic  breathing and practicing self-pacing with activity.      Review  patient is slowly making progress toward personal goals      Expected Outcomes To increase strength, stamina and improve shortness of breath  with efficient breathing techniques with education and increasing workloads on equipment. see outcomes on Admission ITP         Core Components/Risk Factors/Patient Goals at Discharge (Final Review):      Goals and Risk Factor Review - 06/08/16 1148    Core Components/Risk Factors/Patient Goals Review   Personal Goals Review Increase Strength and Stamina;Improve shortness of breath with ADL's;Develop more efficient breathing techniques such as purse lipped breathing and diaphragmatic breathing and practicing self-pacing with activity.   Review patient is slowly making progress toward personal goals   Expected Outcomes see outcomes on Admission ITP      ITP Comments:   Comments: ITP REVIEW Pt is making expected progress toward personal goals after completing 5 sessions.   Recommend continued exercise, life style modification, education, and utilization of breathing techniques to increase stamina and strength and decrease shortness of breath with exertion.

## 2016-06-08 NOTE — Progress Notes (Signed)
Daily Session Note  Patient Details  Name: [name] [name] MRN: [id number] Date of Birth: 1951-09-08 Referring Provider:        Pulmonary Rehab Walk Test from 05/18/2016 in [place]   Referring Provider  Dr. Lavell Anchors      Encounter Date: 06/08/2016  Check In:     Session Check In - 06/08/16 1037    Check-In   Location [facility]   Staff Present Rosebud Poles, RN, Luisa Hart, RN, BSN;Ramon Dredge, RN, MHA;Ceonna Frazzini, MS, ACSM RCEP, Exercise Physiologist   Supervising physician immediately available to respond to emergencies [facility] immediately available   Physician(s) Dr. Waldron Labs   Medication changes reported     No   Fall or balance concerns reported    No   Warm-up and Cool-down Performed as group-led instruction   Resistance Training Performed Yes   VAD Patient? No   Pain Assessment   Currently in Pain? No/denies   Multiple Pain Sites No      Capillary Blood Glucose: No results found for this or any previous visit (from the past 24 hour(s)).      Exercise Prescription Changes - 06/08/16 1200    Response to Exercise   Blood Pressure (Admit) 120/86 mmHg   Blood Pressure (Exercise) 126/74 mmHg   Blood Pressure (Exit) 122/80 mmHg   Heart Rate (Admit) 58 bpm   Heart Rate (Exercise) 76 bpm   Heart Rate (Exit) 63 bpm   Oxygen Saturation (Admit) 97 %   Oxygen Saturation (Exercise) 97 %   Oxygen Saturation (Exit) 99 %   Rating of Perceived Exertion (Exercise) 11   Perceived Dyspnea (Exercise) 1   Duration Progress to 45 minutes of aerobic exercise without signs/symptoms of physical distress   Intensity THRR unchanged   Progression   Progression Continue to progress workloads to maintain intensity without signs/symptoms of physical distress.   Resistance Training   Training Prescription Yes   Weight BLUE BANDS   Reps 10-12   Interval Training   Interval Training No   NuStep   Level 4   Minutes 15   METs 1.7   Arm Ergometer   Level 3   Minutes 15     Goals Met:  Exercise tolerated well No report of cardiac concerns or symptoms Strength training completed today  Goals Unmet:  Not Applicable  Comments: Service time is from 10:30am to 12:15pm    Dr. Rush Farmer is Medical Director for Pulmonary Rehab at [facility].

## 2016-06-09 DIAGNOSIS — J449 Chronic obstructive pulmonary disease, unspecified: Secondary | ICD-10-CM | POA: Diagnosis not present

## 2016-06-09 DIAGNOSIS — G4733 Obstructive sleep apnea (adult) (pediatric): Secondary | ICD-10-CM | POA: Diagnosis not present

## 2016-06-09 DIAGNOSIS — I5040 Unspecified combined systolic (congestive) and diastolic (congestive) heart failure: Secondary | ICD-10-CM | POA: Diagnosis not present

## 2016-06-13 ENCOUNTER — Encounter (HOSPITAL_COMMUNITY)
Admission: RE | Admit: 2016-06-13 | Discharge: 2016-06-13 | Disposition: A | Discharge status: Home / Self Care | Payer: Commercial Managed Care - HMO | Source: Ambulatory Visit | Attending: Internal Medicine | Admitting: Internal Medicine

## 2016-06-13 DIAGNOSIS — R0689 Other abnormalities of breathing: Secondary | ICD-10-CM | POA: Diagnosis not present

## 2016-06-13 DIAGNOSIS — J4540 Moderate persistent asthma, uncomplicated: Secondary | ICD-10-CM | POA: Diagnosis not present

## 2016-06-13 DIAGNOSIS — R0600 Dyspnea, unspecified: Secondary | ICD-10-CM | POA: Diagnosis not present

## 2016-06-13 NOTE — Progress Notes (Signed)
Daily Session Note  Patient Details  Name: [name] [name] MRN: [id number] Date of Birth: 03/24/1951 Referring Provider:        Pulmonary Rehab Walk Test from 05/18/2016 in [facility]   Referring Provider  Dr. Ramasway      Encounter Date: 06/13/2016  Check In:     Session Check In - 06/13/16 1242    Check-In   Location [facility]   Staff Present Joan Behrens, RN, BSN;Molly diVincenzo, MS, ACSM RCEP, Exercise Physiologist;Annedrea Stackhouse, RN, MHA   Supervising physician immediately available to respond to emergencies [facility] immediately available   Physician(s) Dr. Merrell   Medication changes reported     No   Fall or balance concerns reported    No   Warm-up and Cool-down Performed as group-led instruction   Resistance Training Performed Yes   VAD Patient? No   Pain Assessment   Currently in Pain? No/denies   Multiple Pain Sites No      Capillary Blood Glucose: No results found for this or any previous visit (from the past 24 hour(s)).      Exercise Prescription Changes - 06/13/16 1200    Exercise Review   Progression Yes   Response to Exercise   Blood Pressure (Admit) 104/60 mmHg   Blood Pressure (Exercise) 124/82 mmHg   Blood Pressure (Exit) 118/70 mmHg   Heart Rate (Admit) 64 bpm   Heart Rate (Exercise) 77 bpm   Heart Rate (Exit) 65 bpm   Oxygen Saturation (Admit) 94 %   Oxygen Saturation (Exercise) 99 %   Oxygen Saturation (Exit) 100 %   Rating of Perceived Exertion (Exercise) 13   Perceived Dyspnea (Exercise) 2   Duration Progress to 45 minutes of aerobic exercise without signs/symptoms of physical distress   Intensity THRR unchanged   Progression   Progression Continue to progress workloads to maintain intensity without signs/symptoms of physical distress.   Resistance Training   Training Prescription Yes   Weight blue bands'   Reps 10-12   Interval Training   Interval Training No   NuStep   Level 5   Minutes 15   METs 2.2   Arm Ergometer   Level 2  Decreased work load due to tightness and DOE resolved   Minutes 15   Track   Minutes 15     Goals Met:  Strength training completed today  Goals Unmet:  Not Applicable  Comments: Service time is from 1030 to 1215    Dr. Wesam G. Yacoub is Medical Director for Pulmonary Rehab at  Hospital. 

## 2016-06-14 ENCOUNTER — Ambulatory Visit: Payer: Commercial Managed Care - HMO | Admitting: Nurse Practitioner

## 2016-06-14 ENCOUNTER — Other Ambulatory Visit: Payer: Self-pay

## 2016-06-14 NOTE — Patient Outreach (Signed)
[facility]) Care Management  06/14/2016  [name] [name] 07-May-1951 [id number]   Telephone call to patient for monthly call.  Patient reports he cannot talk right now but will call back.   Plan: RN Health Coach will wait patient phone call.  If no return call will attempt within 3 weeks.   Jone Baseman, RN, MSN [place] [phone]

## 2016-06-15 ENCOUNTER — Encounter (HOSPITAL_COMMUNITY)
Admission: RE | Admit: 2016-06-15 | Discharge: 2016-06-15 | Disposition: A | Discharge status: Home / Self Care | Payer: Commercial Managed Care - HMO | Source: Ambulatory Visit | Attending: Internal Medicine | Admitting: Internal Medicine

## 2016-06-15 VITALS — Wt 239.0 lb

## 2016-06-15 DIAGNOSIS — R0600 Dyspnea, unspecified: Secondary | ICD-10-CM

## 2016-06-15 DIAGNOSIS — R0689 Other abnormalities of breathing: Secondary | ICD-10-CM | POA: Diagnosis not present

## 2016-06-15 DIAGNOSIS — J4540 Moderate persistent asthma, uncomplicated: Secondary | ICD-10-CM | POA: Diagnosis not present

## 2016-06-15 NOTE — Progress Notes (Signed)
Daily Session Note  Patient Details  Name: [name] [name] MRN: [id number] Date of Birth: December 06, 1951 Referring Provider:        Pulmonary Rehab Walk Test from 05/18/2016 in [place]   Referring Provider  Dr. Lavell Anchors      Encounter Date: 06/15/2016  Check In:     Session Check In - 06/15/16 1147    Check-In   [facility]   Staff Present Su Hilt, MS, ACSM RCEP, Exercise Physiologist;Joan Leonia Reeves, RN, Roque Cash, RN   Supervising physician immediately available to respond to emergencies [facility] immediately available   Physician(s) Dr. Marily Memos   Medication changes reported     No   Fall or balance concerns reported    No   Warm-up and Cool-down Performed as group-led instruction   Resistance Training Performed Yes   VAD Patient? No   Pain Assessment   Currently in Pain? No/denies   Multiple Pain Sites No      Capillary Blood Glucose: No results found for this or any previous visit (from the past 24 hour(s)).      Exercise Prescription Changes - 06/15/16 1200    Exercise Review   Progression Yes   Response to Exercise   Blood Pressure (Admit) 104/70 mmHg   Blood Pressure (Exercise) 120/82 mmHg   Blood Pressure (Exit) 104/60 mmHg   Heart Rate (Admit) 61 bpm   Heart Rate (Exercise) 76 bpm   Heart Rate (Exit) 70 bpm   Oxygen Saturation (Admit) 97 %   Oxygen Saturation (Exercise) 99 %   Oxygen Saturation (Exit) 97 %   Rating of Perceived Exertion (Exercise) 11   Perceived Dyspnea (Exercise) 0   Duration Progress to 45 minutes of aerobic exercise without signs/symptoms of physical distress   Intensity THRR unchanged   Progression   Progression Continue to progress workloads to maintain intensity without signs/symptoms of physical distress.   Resistance Training   Training Prescription Yes   Weight blue bands'   Reps 10-12   Interval Training   Interval Training No   Arm Ergometer   Level 3  Decreased work load due to tightness and DOE resolved   Minutes [place] 10   Minutes 15     Goals Met:  Exercise tolerated well No report of cardiac concerns or symptoms Strength training completed today  Goals Unmet:  Not Applicable  Comments: Service time is from 10:30AM to 12:05PM    Dr. Rush Farmer is Medical Director for Pulmonary Rehab at [facility].

## 2016-06-16 ENCOUNTER — Ambulatory Visit: Payer: Self-pay | Admitting: Nurse Practitioner

## 2016-06-19 DIAGNOSIS — M2550 Pain in unspecified joint: Secondary | ICD-10-CM | POA: Diagnosis not present

## 2016-06-19 DIAGNOSIS — R700 Elevated erythrocyte sedimentation rate: Secondary | ICD-10-CM | POA: Diagnosis not present

## 2016-06-19 DIAGNOSIS — Z79899 Other long term (current) drug therapy: Secondary | ICD-10-CM | POA: Diagnosis not present

## 2016-06-19 DIAGNOSIS — D638 Anemia in other chronic diseases classified elsewhere: Secondary | ICD-10-CM | POA: Diagnosis not present

## 2016-06-19 DIAGNOSIS — M359 Systemic involvement of connective tissue, unspecified: Secondary | ICD-10-CM | POA: Diagnosis not present

## 2016-06-19 DIAGNOSIS — R5381 Other malaise: Secondary | ICD-10-CM | POA: Diagnosis not present

## 2016-06-19 DIAGNOSIS — E559 Vitamin D deficiency, unspecified: Secondary | ICD-10-CM | POA: Diagnosis not present

## 2016-06-20 ENCOUNTER — Encounter (HOSPITAL_COMMUNITY): Payer: Self-pay | Admitting: *Deleted

## 2016-06-20 ENCOUNTER — Encounter (HOSPITAL_COMMUNITY)
Admission: RE | Admit: 2016-06-20 | Discharge: 2016-06-20 | Disposition: A | Discharge status: Home / Self Care | Payer: Commercial Managed Care - HMO | Source: Ambulatory Visit | Attending: Internal Medicine | Admitting: Internal Medicine

## 2016-06-20 VITALS — Wt 239.0 lb

## 2016-06-20 DIAGNOSIS — R0600 Dyspnea, unspecified: Secondary | ICD-10-CM

## 2016-06-20 DIAGNOSIS — J4540 Moderate persistent asthma, uncomplicated: Secondary | ICD-10-CM | POA: Diagnosis not present

## 2016-06-20 DIAGNOSIS — R0689 Other abnormalities of breathing: Secondary | ICD-10-CM | POA: Diagnosis not present

## 2016-06-20 NOTE — Progress Notes (Signed)
Daily Session Note  Patient Details  Name: [name] [name] MRN: [id number] Date of Birth: 07-16-1951 Referring Provider:        Pulmonary Rehab Walk Test from 05/18/2016 in [place]   Referring Provider  Dr. Lavell Anchors      Encounter Date: 06/20/2016  Check In:     Session Check In - 06/20/16 1030    Check-In   [facility]   Staff Present Rosebud Poles, RN, BSN;Lisa Ysidro Evert, RN;Aneliese Beaudry Rollene Rotunda, RN, BSN;Ramon Dredge, RN, MHA;Molly diVincenzo, MS, ACSM RCEP, Exercise Physiologist   Supervising physician immediately available to respond to emergencies [facility] immediately available   Physician(s) Dr. Marily Memos   Medication changes reported     No   Fall or balance concerns reported    No   Warm-up and Cool-down Performed as group-led instruction   Resistance Training Performed Yes   VAD Patient? No   Pain Assessment   Currently in Pain? No/denies   Multiple Pain Sites No      Capillary Blood Glucose: No results found for this or any previous visit (from the past 24 hour(s)).      Exercise Prescription Changes - 06/20/16 1219    Response to Exercise   Blood Pressure (Admit) 134/84 mmHg   Blood Pressure (Exercise) 134/80 mmHg   Blood Pressure (Exit) 126/78 mmHg   Heart Rate (Admit) 59 bpm   Heart Rate (Exercise) 71 bpm   Heart Rate (Exit) 64 bpm   Oxygen Saturation (Admit) 97 %   Oxygen Saturation (Exercise) 100 %   Oxygen Saturation (Exit) 99 %   Rating of Perceived Exertion (Exercise) 13   Perceived Dyspnea (Exercise) 1   Duration Progress to 45 minutes of aerobic exercise without signs/symptoms of physical distress   Intensity THRR unchanged   Progression   Progression Continue to progress workloads to maintain intensity without signs/symptoms of physical distress.   Resistance Training   Training Prescription Yes   Weight blue bands'   Reps 10-12   Interval Training   Interval Training No   NuStep   Level 5   Minutes 17   METs 1.9   Arm Ergometer   Level 3   [place] 17   Minutes 15   Track   Laps 15   Minutes 17     Goals Met:  Improved SOB with ADL's Using PLB without cueing & demonstrates good technique Exercise tolerated well No report of cardiac concerns or symptoms Strength training completed today  Goals Unmet:  Not Applicable  Comments: Service time is from 1030 to 1210   Dr. Rush Farmer is Medical Director for Pulmonary Rehab at [facility].

## 2016-06-21 ENCOUNTER — Ambulatory Visit: Payer: Self-pay | Admitting: Family Medicine

## 2016-06-22 ENCOUNTER — Encounter (HOSPITAL_COMMUNITY)
Admission: RE | Admit: 2016-06-22 | Discharge: 2016-06-22 | Disposition: A | Discharge status: Home / Self Care | Payer: Commercial Managed Care - HMO | Source: Ambulatory Visit | Attending: Internal Medicine | Admitting: Internal Medicine

## 2016-06-22 VITALS — Wt 240.1 lb

## 2016-06-22 DIAGNOSIS — R0689 Other abnormalities of breathing: Secondary | ICD-10-CM | POA: Diagnosis not present

## 2016-06-22 DIAGNOSIS — R0600 Dyspnea, unspecified: Secondary | ICD-10-CM

## 2016-06-22 DIAGNOSIS — J4540 Moderate persistent asthma, uncomplicated: Secondary | ICD-10-CM | POA: Diagnosis not present

## 2016-06-22 NOTE — Progress Notes (Signed)
Daily Session Note  Patient Details  Name: [name] [name] MRN: [id number] Date of Birth: December 01, 1951 Referring Provider:        Pulmonary Rehab Walk Test from 05/18/2016 in [place]   Referring Provider  Dr. Lavell Anchors      Encounter Date: 06/22/2016  Check In:     Session Check In - 06/22/16 1042    Check-In   [facility]   Staff Present Rosebud Poles, RN, BSN;Lisa Ysidro Evert, RN;Portia Rollene Rotunda, RN, BSN;[place] Goh, MS, ACSM RCEP, Exercise Physiologist   Supervising physician immediately available to respond to emergencies [facility] immediately available   Physician(s) Dr. Waldron Labs   Medication changes reported     No   Fall or balance concerns reported    No   Warm-up and Cool-down Performed as group-led instruction   Resistance Training Performed Yes   VAD Patient? No   Pain Assessment   Currently in Pain? No/denies   Multiple Pain Sites No      Capillary Blood Glucose: No results found for this or any previous visit (from the past 24 hour(s)).      Exercise Prescription Changes - 06/22/16 1200    Response to Exercise   Blood Pressure (Admit) 120/94 mmHg   Blood Pressure (Exercise) 140/80 mmHg   Blood Pressure (Exit) 114/80 mmHg   Heart Rate (Admit) 59 bpm   Heart Rate (Exercise) 69 bpm   Heart Rate (Exit) 60 bpm   Oxygen Saturation (Admit) 98 %   Oxygen Saturation (Exercise) 100 %   Oxygen Saturation (Exit) 100 %   Rating of Perceived Exertion (Exercise) 13   Perceived Dyspnea (Exercise) 2   Duration Progress to 45 minutes of aerobic exercise without signs/symptoms of physical distress   Intensity THRR unchanged   Progression   Progression Continue to progress workloads to maintain intensity without signs/symptoms of physical distress.   Resistance Training   Training Prescription Yes   Weight blue bands'   Reps 10-12   Interval Training   Interval Training No   NuStep   Level 4   Minutes 17    METs 1.9   Track   Laps 9   Minutes 17     Goals Met:  Exercise tolerated well No report of cardiac concerns or symptoms Strength training completed today  Goals Unmet:  Not Applicable  Comments: Service time is from 10:30AM to 12:40PM    Dr. Rush Farmer is Medical Director for Pulmonary Rehab at [facility].

## 2016-06-25 ENCOUNTER — Other Ambulatory Visit: Payer: Self-pay | Admitting: Endocrinology

## 2016-06-26 NOTE — Telephone Encounter (Signed)
Please advise if ok to refill. Rx is not on current medication list.  

## 2016-06-27 ENCOUNTER — Encounter (HOSPITAL_COMMUNITY): Payer: Commercial Managed Care - HMO

## 2016-06-28 MED FILL — VIT D2 1.25 MG (50,000 UNIT: 1.25 MG | 28 days supply | Qty: 8 | Fill #0

## 2016-06-29 ENCOUNTER — Encounter (HOSPITAL_COMMUNITY)
Admission: RE | Admit: 2016-06-29 | Discharge: 2016-06-29 | Disposition: A | Discharge status: Home / Self Care | Payer: Commercial Managed Care - HMO | Source: Ambulatory Visit | Attending: Internal Medicine | Admitting: Internal Medicine

## 2016-06-29 DIAGNOSIS — J4540 Moderate persistent asthma, uncomplicated: Secondary | ICD-10-CM | POA: Insufficient documentation

## 2016-06-29 DIAGNOSIS — R0689 Other abnormalities of breathing: Secondary | ICD-10-CM | POA: Diagnosis not present

## 2016-06-29 DIAGNOSIS — R0600 Dyspnea, unspecified: Secondary | ICD-10-CM | POA: Insufficient documentation

## 2016-06-29 NOTE — Progress Notes (Signed)
Daily Session Note  Patient Details  Name: [name] [name] MRN: [id number] Date of Birth: 10-13-1951 Referring Provider:        Pulmonary Rehab Walk Test from 05/18/2016 in [place]   Referring Provider  Dr. Lavell Anchors      Encounter Date: 06/29/2016  Check In:     Session Check In - 06/29/16 1046    Check-In   [facility]   Staff Present Rosebud Poles, RN, BSN;Lisa Hughes, RN;Kreston Ahrendt, MS, ACSM RCEP, Exercise Physiologist;Annedrea [place], RN, [facility]   Supervising physician immediately available to respond to emergencies [facility] immediately available   Physician(s) DR. GHERGHE   Medication changes reported     No   Fall or balance concerns reported    No   Warm-up and Cool-down Performed as group-led instruction   Resistance Training Performed Yes   VAD Patient? No   Pain Assessment   Currently in Pain? No/denies   Multiple Pain Sites No      Capillary Blood Glucose: No results found for this or any previous visit (from the past 24 hour(s)).      Exercise Prescription Changes - 06/29/16 1200    Exercise Review   Progression Yes   Response to Exercise   Blood Pressure (Admit) 110/64 mmHg   Blood Pressure (Exit) 110/70 mmHg   Heart Rate (Admit) 58 bpm   Heart Rate (Exercise) 71 bpm   Heart Rate (Exit) 60 bpm   Oxygen Saturation (Admit) 99 %   Oxygen Saturation (Exercise) 98 %   Oxygen Saturation (Exit) 100 %   Rating of Perceived Exertion (Exercise) 12   Perceived Dyspnea (Exercise) 1   Duration Progress to 45 minutes of aerobic exercise without signs/symptoms of physical distress   Intensity THRR unchanged   Progression   Progression Continue to progress workloads to maintain intensity without signs/symptoms of physical distress.   Resistance Training   Training Prescription Yes   Weight blue bands'   Reps 10-12   Interval Training   Interval Training No   NuStep   Level 5   Minutes 17    METs 1.8   Track   Laps 11   Minutes 17     Goals Met:  Exercise tolerated well No report of cardiac concerns or symptoms Strength training completed today  Goals Unmet:  Not Applicable  Comments: Service time is from 10:30am to 12:15pm    Dr. Rush Farmer is Medical Director for Pulmonary Rehab at [facility].

## 2016-07-03 ENCOUNTER — Other Ambulatory Visit: Payer: Self-pay

## 2016-07-03 NOTE — Patient Outreach (Signed)
[place] [facility]) Care Management  07/03/2016  [name] [name] [birth year] [id number]   2nd telephone call to patient for monthly call.  Patient answers stating he cannot talk.  Patient states he will call back shortly.   Plan: RN Health Coach will wait patient return call.  If no call back will attempt again in the month of July.    Jone Baseman, RN, MSN [place] [phone]

## 2016-07-04 ENCOUNTER — Encounter (HOSPITAL_COMMUNITY)
Admission: RE | Admit: 2016-07-04 | Discharge: 2016-07-04 | Disposition: A | Discharge status: Home / Self Care | Payer: Commercial Managed Care - HMO | Source: Ambulatory Visit | Attending: Internal Medicine | Admitting: Internal Medicine

## 2016-07-04 VITALS — Wt 239.6 lb

## 2016-07-04 DIAGNOSIS — R0689 Other abnormalities of breathing: Secondary | ICD-10-CM | POA: Diagnosis not present

## 2016-07-04 DIAGNOSIS — R0600 Dyspnea, unspecified: Secondary | ICD-10-CM

## 2016-07-04 DIAGNOSIS — J4540 Moderate persistent asthma, uncomplicated: Secondary | ICD-10-CM | POA: Diagnosis not present

## 2016-07-04 NOTE — Progress Notes (Signed)
Daily Session Note  Patient Details  Name: [name] [name] MRN: [id number] Date of Birth: 1951-11-19 Referring Provider:        Pulmonary Rehab Walk Test from 05/18/2016 in [place]   Referring Provider  Dr. Lavell Anchors      Encounter Date: 07/04/2016  Check In:     Session Check In - 07/04/16 1036    Check-In   Location [facility]   Staff Present Rosebud Poles, RN, BSN;Lisa Ysidro Evert, RN;Jerret Mcbane Rollene Rotunda, RN, BSN;Ramon Dredge, RN, [facility]   Supervising physician immediately available to respond to emergencies [facility] immediately available   Physician(s) Dr. Marily Memos   Medication changes reported     No   Fall or balance concerns reported    No   Warm-up and Cool-down Performed as group-led instruction   Resistance Training Performed Yes   VAD Patient? No   Pain Assessment   Currently in Pain? No/denies   Multiple Pain Sites No      Capillary Blood Glucose: No results found for this or any previous visit (from the past 24 hour(s)).      Exercise Prescription Changes - 07/04/16 1239    Exercise Review   Progression Yes   Response to Exercise   Blood Pressure (Admit) 120/82 mmHg   Blood Pressure (Exercise) 126/70 mmHg   Blood Pressure (Exit) 122/74 mmHg   Heart Rate (Admit) 56 bpm   Heart Rate (Exercise) 74 bpm   Heart Rate (Exit) 61 bpm   Oxygen Saturation (Admit) 98 %   Oxygen Saturation (Exercise) 100 %   Oxygen Saturation (Exit) 98 %   Rating of Perceived Exertion (Exercise) 12   Perceived Dyspnea (Exercise) 1   Duration Progress to 45 minutes of aerobic exercise without signs/symptoms of physical distress   Intensity THRR unchanged   Progression   Progression Continue to progress workloads to maintain intensity without signs/symptoms of physical distress.   Resistance Training   Training Prescription Yes   Weight blue bands'   Reps 10-12   Interval Training   Interval Training No   NuStep   Level 5   Minutes 17   METs 2   Arm Ergometer   Level 3   Minutes 17   Track   Laps 10   Minutes 17     Goals Met:  Improved SOB with ADL's Using PLB without cueing & demonstrates good technique Exercise tolerated well No report of cardiac concerns or symptoms Strength training completed today  Goals Unmet:  Not Applicable  Comments: Service time is from 1030 to 1205   Dr. Rush Farmer is Medical Director for Pulmonary Rehab at [facility].

## 2016-07-06 ENCOUNTER — Encounter (HOSPITAL_COMMUNITY): Payer: Commercial Managed Care - HMO

## 2016-07-06 ENCOUNTER — Encounter (HOSPITAL_COMMUNITY): Payer: Self-pay

## 2016-07-06 NOTE — Progress Notes (Signed)
Pulmonary Individual Treatment Plan  Patient Details  Name: [name] [name] MRN: [id number] Date of Birth: 1951/10/28 Referring Provider:        Pulmonary Rehab Walk Test from 05/18/2016 in [place]   Referring Provider  Dr. Lavell Anchors      Initial Encounter Date:       Pulmonary Rehab Walk Test from 05/18/2016 in [place]   Date  05/18/16   Referring Provider  Dr. Lavell Anchors      Visit Diagnosis: No diagnosis found.  Patient's Home Medications on Admission:   Current outpatient prescriptions:  .  ADVAIR DISKUS 250-50 MCG/DOSE AEPB, Inhale 1 puff into the lungs 2 (two) times daily., Disp: 60 each, Rfl: 3 .  albuterol (PROVENTIL HFA;VENTOLIN HFA) 108 (90 Base) MCG/ACT inhaler, Inhale 2 puffs into the lungs every 6 (six) hours as needed., Disp: 1 Inhaler, Rfl: 5 .  albuterol (PROVENTIL) (2.5 MG/3ML) 0.083% nebulizer solution, Take 3 mLs (2.5 mg total) by nebulization every 6 (six) hours as needed for wheezing or shortness of breath., Disp: 150 mL, Rfl: 1 .  amiodarone (PACERONE) 200 MG tablet, Take 1 tablet (200 mg total) by mouth 2 (two) times daily., Disp: 60 tablet, Rfl: 2 .  aspirin EC 81 MG tablet, Take 81 mg by mouth daily.  , Disp: , Rfl:  .  atorvastatin (LIPITOR) 40 MG tablet, Take 1 tablet (40 mg total) by mouth daily at 6 PM., Disp: 30 tablet, Rfl: 3 .  clomiPHENE (CLOMID) 50 MG tablet, 1/4 tab daily, Disp: 10 tablet, Rfl: 5 .  clotrimazole-betamethasone (LOTRISONE) cream, Apply 1 application topically 2 (two) times daily. (Patient taking differently: Apply 1 application topically 2 (two) times daily as needed (FOR RASH). ), Disp: 30 g, Rfl: 1 .  colchicine 0.6 MG tablet, Take 1 tablet (0.6 mg total) by mouth 2 (two) times daily., Disp: 60 tablet, Rfl: 3 .  digoxin (LANOXIN) 0.125 MG tablet, Take 1 tablet (0.125 mg total) by mouth daily., Disp: 30 tablet, Rfl: 3 .  ferrous sulfate 325 (65 FE) MG tablet, Take 325 mg  by mouth daily with breakfast. Reported on 03/16/2016, Disp: , Rfl:  .  furosemide (LASIX) 20 MG tablet, TAKE 1 TABLET (20 MG TOTAL) BY MOUTH DAILY., Disp: 30 tablet, Rfl: 0 .  levETIRAcetam (KEPPRA) 750 MG tablet, Take 2 tablets (1,500 mg total) by mouth 2 (two) times daily., Disp: 120 tablet, Rfl: 11 .  lisinopril (PRINIVIL,ZESTRIL) 20 MG tablet, Take 20 mg by mouth daily. , Disp: , Rfl:  .  metoprolol succinate (TOPROL XL) 25 MG 24 hr tablet, Take 1 tablet (25 mg total) by mouth daily., Disp: 30 tablet, Rfl: 3 .  metoprolol succinate (TOPROL-XL) 50 MG 24 hr tablet, , Disp: , Rfl: 3 .  nitroGLYCERIN (NITROSTAT) 0.4 MG SL tablet, Place 1 tablet (0.4 mg total) under the tongue every 5 (five) minutes as needed for chest pain., Disp: 25 tablet, Rfl: 1 .  omeprazole (PRILOSEC) 40 MG capsule, Take 1 capsule (40 mg total) by mouth 2 (two) times daily before a meal., Disp: 60 capsule, Rfl: 3 .  pantoprazole (PROTONIX) 40 MG tablet, , Disp: , Rfl:  .  tamoxifen (NOLVADEX) 10 MG tablet, Take 1 tablet (10 mg total) by mouth daily., Disp: 30 tablet, Rfl: 11 .  tamsulosin (FLOMAX) 0.4 MG CAPS capsule, Take 1 capsule (0.4 mg total) by mouth daily., Disp: 30 capsule, Rfl: 3  Past Medical History: Past Medical History  Diagnosis Date  . Hypertension   . Asthma   . Atrial fibrillation ([place])   . CHF (congestive heart failure) ([place])   . Anemia   . Heart disease   . COPD (chronic obstructive pulmonary disease) ([facility])     Tobacco Use: History  Smoking status  . Never Smoker   Smokeless tobacco  . Never Used    Labs: Recent Review Flowsheet Data    Labs for ITP Cardiac and Pulmonary Rehab Latest Ref Rng 01/25/2014 08/24/2014 11/27/2015 12/08/2015 12/09/2015   Cholestrol 0 - 200 mg/dL 145 138 - - 144   LDLCALC 0 - 99 mg/dL 83 77 - - 76   HDL >40 mg/dL 39(L) 38(L) - - 57   Trlycerides <150 mg/dL 116 113 - - 53   Hemoglobin A1c 4.8 - 5.6 % - - - 5.8(H) -   PHART 7.350 - 7.450 - - 7.394 - -   PCO2ART 35.0  - 45.0 mmHg - - 39.0 - -   HCO3 20.0 - 24.0 mEq/L - - 23.8 - -   TCO2 0 - 100 mmol/L - - 25 23 -   ACIDBASEDEF 0.0 - 2.0 mmol/L - - 1.0 - -   O2SAT - - - 99.0 - -      Capillary Blood Glucose: Lab Results  Component Value Date   GLUCAP 71 01/23/2016   GLUCAP 78 01/24/2014     ADL UCSD:     Pulmonary Assessment Scores      06/20/16 1412       ADL UCSD   ADL Phase Entry     SOB Score total 85        Pulmonary Function Assessment:     Pulmonary Function Assessment - 05/08/16 1037    Breath   Bilateral Breath Sounds Clear   Shortness of Breath Fear of Shortness of Breath;Limiting activity      Exercise Target Goals:    Exercise Program Goal: Individual exercise prescription set with THRR, safety & activity barriers. Participant demonstrates ability to understand and report RPE using BORG scale, to self-measure pulse accurately, and to acknowledge the importance of the exercise prescription.  Exercise Prescription Goal: Starting with aerobic activity 30 plus minutes a day, 3 days per week for initial exercise prescription. Provide home exercise prescription and guidelines that participant acknowledges understanding prior to discharge.  Activity Barriers & Risk Stratification:     Activity Barriers & Cardiac Risk Stratification - 05/08/16 1036    Activity Barriers & Cardiac Risk Stratification   Activity Barriers --  none      6 Minute Walk:     6 Minute Walk      05/19/16 0659       6 Minute Walk   Phase Initial     Distance 1350 feet     Walk Time 6 minutes     MPH 2.55     RPE 12     VO2 Peak 9.7     Symptoms No     Resting HR 73 bpm     Resting BP 110/80 mmHg     Max Ex. HR 107 bpm     Max Ex. BP 158/60 mmHg     Interval HR   Baseline HR 73     1 Minute HR 96     2 Minute HR 102     3 Minute HR 105     4 Minute HR 104     5 Minute HR 107  6 Minute HR 106     2 Minute Post HR 91     Interval Oxygen   Interval Oxygen? Yes      Baseline Oxygen Saturation % 96 %     Baseline Liters of Oxygen 0 L     1 Minute Oxygen Saturation % 96 %     1 Minute Liters of Oxygen 0 L     2 Minute Oxygen Saturation % 96 %     2 Minute Liters of Oxygen 0 L     3 Minute Oxygen Saturation % 99 %     3 Minute Liters of Oxygen 0 L     4 Minute Oxygen Saturation % 98 %     4 Minute Liters of Oxygen 0 L     5 Minute Oxygen Saturation % 98 %     5 Minute Liters of Oxygen 0 L     6 Minute Oxygen Saturation % 98 %     6 Minute Liters of Oxygen 0 L     2 Minute Post Oxygen Saturation % 98 %     2 Minute Post Liters of Oxygen 0 L        Initial Exercise Prescription:     Initial Exercise Prescription - 05/19/16 0700    Date of Initial Exercise RX and Referring Provider   Date 05/18/16   Referring Provider Dr. Lavell Anchors   NuStep   Level 3   Minutes 15   METs 1.7   Arm Ergometer   Level 3   Watts 30   Minutes 15   Track   Laps 7   Minutes 15   Prescription Details   Frequency (times per week) 2   Duration Progress to 45 minutes of aerobic exercise without signs/symptoms of physical distress   Intensity   THRR 40-80% of Max Heartrate 61-122   Ratings of Perceived Exertion 11-13   Perceived Dyspnea 0-4   Progression   Progression Continue progressive overload as per policy without signs/symptoms or physical distress.   Resistance Training   Training Prescription Yes   Weight BLUE BANDS   Reps 10-12      Perform Capillary Blood Glucose checks as needed.  Exercise Prescription Changes:     Exercise Prescription Changes      05/25/16 1300 05/30/16 1200 06/01/16 1200 06/06/16 1200 06/08/16 1200   Exercise Review   Progression    Yes    Response to Exercise   Blood Pressure (Admit) 124/80 mmHg 124/62 mmHg 102/64 mmHg 136/76 mmHg 120/86 mmHg   Blood Pressure (Exercise) 128/62 mmHg 134/76 mmHg 126/84 mmHg 150/76 mmHg 126/74 mmHg   Blood Pressure (Exit) 126/80 mmHg 108/76 mmHg 118/80 mmHg 110/72 mmHg 122/80 mmHg    Heart Rate (Admit) 61 bpm 69 bpm 62 bpm 64 bpm 58 bpm   Heart Rate (Exercise) 68 bpm 73 bpm 66 bpm 90 bpm 76 bpm   Heart Rate (Exit) 59 bpm 63 bpm 64 bpm 65 bpm 63 bpm   Oxygen Saturation (Admit) 100 % 96 % 98 % 99 % 97 %   Oxygen Saturation (Exercise) 98 % 96 % 97 % 96 % 97 %   Oxygen Saturation (Exit) 98 % 96 % 99 % 95 % 99 %   Rating of Perceived Exertion (Exercise) 11 11 11 11 11    Perceived Dyspnea (Exercise) 1 0 1 1 1    Duration Progress to 45 minutes of aerobic exercise without signs/symptoms of physical distress Progress to 45 minutes  of aerobic exercise without signs/symptoms of physical distress Progress to 45 minutes of aerobic exercise without signs/symptoms of physical distress Progress to 45 minutes of aerobic exercise without signs/symptoms of physical distress Progress to 45 minutes of aerobic exercise without signs/symptoms of physical distress   Intensity Rest + 40 Rest + 40 THRR unchanged THRR unchanged THRR unchanged   Progression   Progression Continue to progress workloads to maintain intensity without signs/symptoms of physical distress. Continue to progress workloads to maintain intensity without signs/symptoms of physical distress. Continue to progress workloads to maintain intensity without signs/symptoms of physical distress. Continue to progress workloads to maintain intensity without signs/symptoms of physical distress. Continue to progress workloads to maintain intensity without signs/symptoms of physical distress.   Resistance Training   Training Prescription Yes Yes Yes Yes Yes   Weight BLUE BANDS BLUE BANDS BLUE BANDS BLUE BANDS BLUE BANDS   Reps 10-12 10-12 10-12 10-12 10-12   Interval Training   Interval Training   No No No   NuStep   Level  3 3 4 4    Minutes  15 15 15 15    METs  1.6 1.8 1.7 1.7   Arm Ergometer   Level 1 3  3 3    Watts 30 30      Minutes 15 15  15 15    Track   Laps 10 10 11 10     Minutes 15 15 15 15       06/13/16 1200 06/15/16 1200  06/20/16 1219 06/22/16 1200 06/29/16 1200   Exercise Review   Progression Yes Yes   Yes   Response to Exercise   Blood Pressure (Admit) 104/60 mmHg 104/70 mmHg 134/84 mmHg 120/94 mmHg 110/64 mmHg   Blood Pressure (Exercise) 124/82 mmHg 120/82 mmHg 134/80 mmHg 140/80 mmHg    Blood Pressure (Exit) 118/70 mmHg 104/60 mmHg 126/78 mmHg 114/80 mmHg 110/70 mmHg   Heart Rate (Admit) 64 bpm 61 bpm 59 bpm 59 bpm 58 bpm   Heart Rate (Exercise) 77 bpm 76 bpm 71 bpm 69 bpm 71 bpm   Heart Rate (Exit) 65 bpm 70 bpm 64 bpm 60 bpm 60 bpm   Oxygen Saturation (Admit) 94 % 97 % 97 % 98 % 99 %   Oxygen Saturation (Exercise) 99 % 99 % 100 % 100 % 98 %   Oxygen Saturation (Exit) 100 % 97 % 99 % 100 % 100 %   Rating of Perceived Exertion (Exercise) 13 11 13 13 12    Perceived Dyspnea (Exercise) 2 0 1 2 1    Duration Progress to 45 minutes of aerobic exercise without signs/symptoms of physical distress Progress to 45 minutes of aerobic exercise without signs/symptoms of physical distress Progress to 45 minutes of aerobic exercise without signs/symptoms of physical distress Progress to 45 minutes of aerobic exercise without signs/symptoms of physical distress Progress to 45 minutes of aerobic exercise without signs/symptoms of physical distress   Intensity THRR unchanged THRR unchanged THRR unchanged THRR unchanged THRR unchanged   Progression   Progression Continue to progress workloads to maintain intensity without signs/symptoms of physical distress. Continue to progress workloads to maintain intensity without signs/symptoms of physical distress. Continue to progress workloads to maintain intensity without signs/symptoms of physical distress. Continue to progress workloads to maintain intensity without signs/symptoms of physical distress. Continue to progress workloads to maintain intensity without signs/symptoms of physical distress.   Resistance Training   Training Prescription Yes Yes Yes Yes Yes   Weight blue bands'  blue bands' blue bands' blue  bands' blue bands'   Reps 10-12 10-12 10-12 10-12 10-12   Interval Training   Interval Training No No No No No   NuStep   Level 5  5 4 5    Minutes 15  17 17 17    METs 2.2  1.9 1.9 1.8   Arm Ergometer   Level 2  Decreased work load due to tightness and DOE resolved 3  Decreased work load due to tightness and DOE resolved 3     Watts   17     Minutes 15 15 15      Track   Laps  10 15 9 11    Minutes 15 15 17 17 17    Home Exercise Plan   Plans to continue exercise at   Home     Frequency   Add 3 additional days to program exercise sessions.       07/04/16 1239           Exercise Review   Progression Yes       Response to Exercise   Blood Pressure (Admit) 120/82 mmHg       Blood Pressure (Exercise) 126/70 mmHg       Blood Pressure (Exit) 122/74 mmHg       Heart Rate (Admit) 56 bpm       Heart Rate (Exercise) 74 bpm       Heart Rate (Exit) 61 bpm       Oxygen Saturation (Admit) 98 %       Oxygen Saturation (Exercise) 100 %       Oxygen Saturation (Exit) 98 %       Rating of Perceived Exertion (Exercise) 12       Perceived Dyspnea (Exercise) 1       Duration Progress to 45 minutes of aerobic exercise without signs/symptoms of physical distress       Intensity THRR unchanged       Progression   Progression Continue to progress workloads to maintain intensity without signs/symptoms of physical distress.       Resistance Training   Training Prescription Yes       Weight blue bands'       Reps 10-12       Interval Training   Interval Training No       NuStep   Level 5       Minutes 17       METs 2       Arm Ergometer   Level 3       Minutes 17       Track   Laps 10       Minutes 17          Exercise Comments:     Exercise Comments      06/08/16 0746 06/20/16 1302 07/06/16 1615       Exercise Comments Patient has attended three exercise sessions. Has had slight increases. Will cont. to monitor exercise progression.  Spoke with  patient in regards to Home Exercise Prescription today. Patient is going to exercise at home walking in neighborhood 2-3 days a week for 30 minutes.  Patient continues to tolerate workload increases on exercise equiptment.        Discharge Exercise Prescription (Final Exercise Prescription Changes):     Exercise Prescription Changes - 07/04/16 1239    Exercise Review   Progression Yes   Response to Exercise   Blood Pressure (Admit) 120/82 mmHg   Blood Pressure (Exercise) 126/70 mmHg  Blood Pressure (Exit) 122/74 mmHg   Heart Rate (Admit) 56 bpm   Heart Rate (Exercise) 74 bpm   Heart Rate (Exit) 61 bpm   Oxygen Saturation (Admit) 98 %   Oxygen Saturation (Exercise) 100 %   Oxygen Saturation (Exit) 98 %   Rating of Perceived Exertion (Exercise) 12   Perceived Dyspnea (Exercise) 1   Duration Progress to 45 minutes of aerobic exercise without signs/symptoms of physical distress   Intensity THRR unchanged   Progression   Progression Continue to progress workloads to maintain intensity without signs/symptoms of physical distress.   Resistance Training   Training Prescription Yes   Weight blue bands'   Reps 10-12   Interval Training   Interval Training No   NuStep   Level 5   Minutes 17   METs 2   Arm Ergometer   Level 3   Minutes 17   Track   Laps 10   Minutes 17       Nutrition:  Target Goals: Understanding of nutrition guidelines, daily intake of sodium 1500mg , cholesterol 200mg , calories 30% from fat and 7% or less from saturated fats, daily to have 5 or more servings of fruits and vegetables.  Biometrics:    Nutrition Therapy Plan and Nutrition Goals:     Nutrition Therapy & Goals - 06/08/16 1301    Nutrition Therapy   Diet Therapeutic Lifestyle Changes   Personal Nutrition Goals   Personal Goal #1 0.5-2 lb wt loss per week to a goal wt loss of 6-24 lb at graduation from [place], educate and counsel  regarding individualized specific dietary modifications aiming towards targeted core components such as weight, hypertension, lipid management, diabetes, heart failure and other comorbidities.   Expected Outcomes Short Term Goal: Understand basic principles of dietary content, such as calories, fat, sodium, cholesterol and nutrients.;Long Term Goal: Adherence to prescribed nutrition plan.      Nutrition Discharge: Rate Your Plate Scores:     Nutrition Assessments - 06/08/16 1253    Rate Your Plate Scores   Pre Score 37      Psychosocial: Target Goals: Acknowledge presence or absence of depression, maximize coping skills, provide positive support system. Participant is able to verbalize types and ability to use techniques and skills needed for reducing stress and depression.  Initial Review & Psychosocial Screening:     Initial Psych Review & Screening - 05/08/16 [place]? No   Concerns No support system  family is in other states, does not have many close friends   Barriers   Psychosocial barriers to participate in program There are no identifiable barriers or psychosocial needs.   Screening Interventions   Interventions Encouraged to exercise      Quality of Life Scores:     Quality of Life - 06/20/16 1411    Quality of Life Scores   Health/Function Pre 17.17 %   Socioeconomic Pre 17.33 %   Psych/Spiritual Pre 18.57 %   Family Pre 16.38 %   GLOBAL Pre 17.4 %      PHQ-9:     Recent Review Flowsheet Data    Depression screen [facility] 2/9 05/24/2016 05/08/2016 04/27/2016 03/23/2016 03/21/2016   Decreased Interest 0 0 0 0 0   Down, Depressed, Hopeless 0 0 0 0 0   PHQ - 2 Score 0 0 0 0 0      Psychosocial Evaluation and Intervention:  Psychosocial Evaluation - 05/08/16 1055    Psychosocial Evaluation & Interventions   Interventions Encouraged to exercise with the program and follow exercise prescription   Continued Psychosocial  Services Needed No      Psychosocial Re-Evaluation:     Psychosocial Re-Evaluation      06/08/16 1149 07/04/16 0823         Psychosocial Re-Evaluation   Interventions Encouraged to attend Pulmonary Rehabilitation for the exercise Encouraged to attend Pulmonary Rehabilitation for the exercise      Comments patient continues to have flat affect. RN continues to offer support and referral to spiritual care however pt states he is ok. patient continues to have flat affect. RN continues to offer support and referral to spiritual care however pt states he is ok.      Continued Psychosocial Services Needed Yes         Education: Education Goals: Education classes will be provided on a weekly basis, covering required topics. Participant will state understanding/return demonstration of topics presented.  Learning Barriers/Preferences:     Learning Barriers/Preferences - 05/08/16 1036    Learning Barriers/Preferences   Learning Barriers None   Learning Preferences Written Material;Video;Verbal Instruction;Skilled Demonstration;Pictoral;Individual Instruction;Group Instruction;Computer/Internet      Education Topics: Risk Factor Reduction:  -Group instruction that is supported by a PowerPoint presentation. Instructor discusses the definition of a risk factor, different risk factors for pulmonary disease, and how the heart and lungs work together.     Nutrition for Pulmonary Patient:  -Group instruction provided by PowerPoint slides, verbal discussion, and written materials to support subject matter. The instructor gives an explanation and review of healthy diet recommendations, which includes a discussion on weight management, recommendations for fruit and vegetable consumption, as well as protein, fluid, caffeine, fiber, sodium, sugar, and alcohol. Tips for eating when patients are short of breath are discussed.          PULMONARY REHAB OTHER RESPIRATORY from 06/29/2016 in [place]   Date  05/25/16   Educator  RD   Instruction Review Code  2- meets goals/outcomes      Pursed Lip Breathing:  -Group instruction that is supported by demonstration and informational handouts. Instructor discusses the benefits of pursed lip and diaphragmatic breathing and detailed demonstration on how to preform both.        PULMONARY REHAB OTHER RESPIRATORY from 06/29/2016 in [place]   Date  06/15/16   Educator  rt   Instruction Review Code  2- meets goals/outcomes      Oxygen Safety:  -Group instruction provided by PowerPoint, verbal discussion, and written material to support subject matter. There is an overview of "What is Oxygen" and "Why do we need it".  Instructor also reviews how to create a safe environment for oxygen use, the importance of using oxygen as prescribed, and the risks of noncompliance. There is a brief discussion on traveling with oxygen and resources the patient may utilize.      PULMONARY REHAB OTHER RESPIRATORY from 06/29/2016 in [place]   Date  06/08/16   Educator  rn   Instruction Review Code  2- meets goals/outcomes      Oxygen Equipment:  -Group instruction provided by [employer] Staff utilizing handouts, written materials, and equipment demonstrations.      PULMONARY REHAB OTHER RESPIRATORY from 06/29/2016 in [place]   Date  06/22/16   Educator  [employer]  Rep   Instruction Review Code  2- meets goals/outcomes      Signs and Symptoms:  -Group instruction provided by written material and verbal discussion to support subject matter. Warning signs and symptoms of infection, stroke, and heart attack are reviewed and when to call the physician/911 reinforced. Tips for preventing the spread of infection discussed.   Advanced Directives:  -Group instruction provided by verbal instruction and written material to support subject  matter. Instructor reviews Advanced Directive laws and proper instruction for filling out document.   Pulmonary Video:  -Group video education that reviews the importance of medication and oxygen compliance, exercise, good nutrition, pulmonary hygiene, and pursed lip and diaphragmatic breathing for the pulmonary patient.      PULMONARY REHAB OTHER RESPIRATORY from 06/29/2016 in [place]   Date  06/01/16   Instruction Review Code  2- meets goals/outcomes      Exercise for the Pulmonary Patient:  -Group instruction that is supported by a PowerPoint presentation. Instructor discusses benefits of exercise, core components of exercise, frequency, duration, and intensity of an exercise routine, importance of utilizing pulse oximetry during exercise, safety while exercising, and options of places to exercise outside of rehab.        PULMONARY REHAB OTHER RESPIRATORY from 06/29/2016 in [place]   Date  06/29/16   Educator  EP   Instruction Review Code  2- meets goals/outcomes      Pulmonary Medications:  -Verbally interactive group education provided by instructor with focus on inhaled medications and proper administration.   Anatomy and Physiology of the Respiratory System and Intimacy:  -Group instruction provided by PowerPoint, verbal discussion, and written material to support subject matter. Instructor reviews respiratory cycle and anatomical components of the respiratory system and their functions. Instructor also reviews differences in obstructive and restrictive respiratory diseases with examples of each. Intimacy, Sex, and Sexuality differences are reviewed with a discussion on how relationships can change when diagnosed with pulmonary disease. Common sexual concerns are reviewed.   Knowledge Questionnaire Score:     Knowledge Questionnaire Score - 06/20/16 1411    Knowledge Questionnaire Score   Pre Score 9/13       Core Components/Risk Factors/Patient Goals at Admission:     Personal Goals and Risk Factors at Admission - 05/08/16 1041    Core Components/Risk Factors/Patient Goals on Admission   Increase Strength and Stamina Yes   Intervention Provide advice, education, support and counseling about physical activity/exercise needs.;Develop an individualized exercise prescription for aerobic and resistive training based on initial evaluation findings, risk stratification, comorbidities and participant's personal goals.   Expected Outcomes Achievement of increased cardiorespiratory fitness and enhanced flexibility, muscular endurance and strength shown through measurements of functional capacity and personal statement of participant.   Improve shortness of breath with ADL's Yes   Intervention Provide education, individualized exercise plan and daily activity instruction to help decrease symptoms of SOB with activities of daily living.   Expected Outcomes Short Term: Achieves a reduction of symptoms when performing activities of daily living.   Develop more efficient breathing techniques such as purse lipped breathing and diaphragmatic breathing; and practicing self-pacing with activity Yes   Intervention Provide education, demonstration and support about specific breathing techniuqes utilized for more efficient breathing. Include techniques such as pursed lipped breathing, diaphragmatic breathing and self-pacing activity.   Expected Outcomes Short Term: Participant will be able to demonstrate and use breathing techniques as needed throughout daily activities.  Core Components/Risk Factors/Patient Goals Review:      Goals and Risk Factor Review      05/08/16 1049 06/08/16 1148 07/04/16 0822       Core Components/Risk Factors/Patient Goals Review   Personal Goals Review Increase Strength and Stamina;Improve shortness of breath with ADL's;Develop more efficient breathing techniques such as purse  lipped breathing and diaphragmatic breathing and practicing self-pacing with activity. Increase Strength and Stamina;Improve shortness of breath with ADL's;Develop more efficient breathing techniques such as purse lipped breathing and diaphragmatic breathing and practicing self-pacing with activity. Increase Strength and Stamina;Improve shortness of breath with ADL's;Develop more efficient breathing techniques such as purse lipped breathing and diaphragmatic breathing and practicing self-pacing with activity.     Review  patient is slowly making progress toward personal goals patient is slowly making progress toward personal goals     Expected Outcomes To increase strength, stamina and improve shortness of breath with efficient breathing techniques with education and increasing workloads on equipment. see outcomes on Admission ITP see outcomes on Admission ITP        Core Components/Risk Factors/Patient Goals at Discharge (Final Review):      Goals and Risk Factor Review - 07/04/16 [id number]    Core Components/Risk Factors/Patient Goals Review   Personal Goals Review Increase Strength and Stamina;Improve shortness of breath with ADL's;Develop more efficient breathing techniques such as purse lipped breathing and diaphragmatic breathing and practicing self-pacing with activity.   Review patient is slowly making progress toward personal goals   Expected Outcomes see outcomes on Admission ITP      ITP Comments:   Comments: ITP REVIEW Pt is making expected progress toward personal goals after completing 11 sessions. Recommend continued exercise, life style modification, education, and utilization of breathing techniques to increase stamina and strength and decrease shortness of breath with exertion.

## 2016-07-09 ENCOUNTER — Other Ambulatory Visit: Payer: Self-pay | Admitting: Endocrinology

## 2016-07-09 DIAGNOSIS — G4733 Obstructive sleep apnea (adult) (pediatric): Secondary | ICD-10-CM | POA: Diagnosis not present

## 2016-07-09 DIAGNOSIS — I5040 Unspecified combined systolic (congestive) and diastolic (congestive) heart failure: Secondary | ICD-10-CM | POA: Diagnosis not present

## 2016-07-09 DIAGNOSIS — J449 Chronic obstructive pulmonary disease, unspecified: Secondary | ICD-10-CM | POA: Diagnosis not present

## 2016-07-10 ENCOUNTER — Ambulatory Visit: Payer: Self-pay | Admitting: Family Medicine

## 2016-07-10 NOTE — Telephone Encounter (Signed)
Please advise if ok to refill. Thanks 

## 2016-07-11 ENCOUNTER — Ambulatory Visit (INDEPENDENT_AMBULATORY_CARE_PROVIDER_SITE_OTHER): Payer: Commercial Managed Care - HMO | Admitting: Neurology

## 2016-07-11 ENCOUNTER — Encounter (HOSPITAL_COMMUNITY)
Admission: RE | Admit: 2016-07-11 | Discharge: 2016-07-11 | Disposition: A | Discharge status: Home / Self Care | Payer: Commercial Managed Care - HMO | Source: Ambulatory Visit | Attending: Internal Medicine | Admitting: Internal Medicine

## 2016-07-11 VITALS — Wt 242.7 lb

## 2016-07-11 DIAGNOSIS — R259 Unspecified abnormal involuntary movements: Secondary | ICD-10-CM | POA: Diagnosis not present

## 2016-07-11 DIAGNOSIS — R0689 Other abnormalities of breathing: Secondary | ICD-10-CM | POA: Diagnosis not present

## 2016-07-11 DIAGNOSIS — R0600 Dyspnea, unspecified: Secondary | ICD-10-CM | POA: Diagnosis not present

## 2016-07-11 DIAGNOSIS — J4540 Moderate persistent asthma, uncomplicated: Secondary | ICD-10-CM | POA: Diagnosis not present

## 2016-07-11 NOTE — Progress Notes (Signed)
Daily Session Note  Patient Details  Name: [name] [name] MRN: [id number] Date of Birth: 1951/05/14 Referring Provider:        Pulmonary Rehab Walk Test from 05/18/2016 in [place]   Referring Provider  Dr. Lavell Anchors      Encounter Date: 07/11/2016  Check In:     Session Check In - 07/11/16 1130    Check-In   Location [facility]   Staff Present Rosebud Poles, RN, BSN;Lisa Ysidro Evert, RN;Portia Rollene Rotunda, RN, BSN;Ramon Dredge, RN, MHA;Molly diVincenzo, MS, ACSM RCEP, Exercise Physiologist   Supervising physician immediately available to respond to emergencies [facility] immediately available   Physician(s) Dr. Marily Memos   Medication changes reported     No   Fall or balance concerns reported    No   Warm-up and Cool-down Performed as group-led instruction   Resistance Training Performed Yes   VAD Patient? No   Pain Assessment   Currently in Pain? No/denies   Multiple Pain Sites No      Capillary Blood Glucose: No results found for this or any previous visit (from the past 24 hour(s)).      Exercise Prescription Changes - 07/11/16 1200    Response to Exercise   Blood Pressure (Admit) 108/64 mmHg   Blood Pressure (Exercise) 130/70 mmHg   Blood Pressure (Exit) 122/62 mmHg   Heart Rate (Admit) 69 bpm   Heart Rate (Exercise) 80 bpm   Heart Rate (Exit) 63 bpm   Oxygen Saturation (Admit) 98 %   Oxygen Saturation (Exercise) 100 %   Oxygen Saturation (Exit) 99 %   Rating of Perceived Exertion (Exercise) 13   Perceived Dyspnea (Exercise) 2   Duration Progress to 45 minutes of aerobic exercise without signs/symptoms of physical distress   Intensity THRR unchanged   Progression   Progression Continue to progress workloads to maintain intensity without signs/symptoms of physical distress.   Resistance Training   Training Prescription Yes   Weight blue bands'   Reps 10-12  10 minutes of strength training   Interval  Training   Interval Training No   NuStep   Level 5   Minutes 17   METs 2   Arm Ergometer   Level 3   Minutes 17   Track   Laps 10   Minutes 17     Goals Met:  Exercise tolerated well Strength training completed today  Goals Unmet:  Not Applicable  Comments: Service time is from 1030 to 1205    Dr. Rush Farmer is Medical Director for Pulmonary Rehab at [facility].

## 2016-07-11 NOTE — Procedures (Signed)
   HISTORY: 65 year old male, presented with recurrent episode of right arm shaking  TECHNIQUE:  16 channel EEG was performed based on standard 10-16 international system. One channel was dedicated to EKG, which has demonstrates normal sinus rhythm of 66 beats per minutes.  Upon awakening, the posterior background activity was well-developed, in alpha range, 9 Hz, reactive to eye opening and closure.  There was no evidence of epileptiform discharge. There was bilateral frontal theta range activities.  Photic stimulation was performed, which induced a symmetric photic driving.  Hyperventilation was performed, there was no abnormality elicit.  No sleep was achieved.  CONCLUSION: This is a  normal awake EEG.  There is no electrodiagnostic evidence of epileptiform discharge.  Marcial Pacas, M.D. Ph.D.  [facility] [place], [place] [address] Phone: [phone] Fax:      [phone]

## 2016-07-12 ENCOUNTER — Telehealth: Payer: Self-pay | Admitting: *Deleted

## 2016-07-12 NOTE — Telephone Encounter (Signed)
I spoke to pt and relayed that the EEG normal, keep f/u appt 07-19-16 at 0945.  Verbalized understanding.

## 2016-07-12 NOTE — Telephone Encounter (Signed)
-----   Message from Dennie Bible, NP sent at 07/12/2016  8:13 AM EDT ----- Sleep deprived EEG normal. Follow up as planned

## 2016-07-13 ENCOUNTER — Encounter (HOSPITAL_COMMUNITY)
Admission: RE | Admit: 2016-07-13 | Discharge: 2016-07-13 | Disposition: A | Discharge status: Home / Self Care | Payer: Commercial Managed Care - HMO | Source: Ambulatory Visit | Attending: Internal Medicine | Admitting: Internal Medicine

## 2016-07-13 VITALS — Wt 241.2 lb

## 2016-07-13 DIAGNOSIS — R0600 Dyspnea, unspecified: Secondary | ICD-10-CM | POA: Diagnosis not present

## 2016-07-13 DIAGNOSIS — J4540 Moderate persistent asthma, uncomplicated: Secondary | ICD-10-CM | POA: Diagnosis not present

## 2016-07-13 DIAGNOSIS — R0689 Other abnormalities of breathing: Secondary | ICD-10-CM | POA: Diagnosis not present

## 2016-07-13 NOTE — Progress Notes (Signed)
Daily Session Note  Patient Details  Name: [name] [name] MRN: [id number] Date of Birth: Apr 29, 1951 Referring Provider:        Pulmonary Rehab Walk Test from 05/18/2016 in [place]   Referring Provider  Dr. Lavell Anchors      Encounter Date: 07/13/2016  Check In:     Session Check In - 07/13/16 1042    Check-In   Location [facility]   Staff Present Su Hilt, MS, ACSM RCEP, Exercise Physiologist;Portia Rollene Rotunda, RN, Maxcine Ham, RN, BSN   Supervising physician immediately available to respond to emergencies [facility] immediately available   Physician(s) Dr. Cruzita Lederer   Medication changes reported     No   Fall or balance concerns reported    No   Warm-up and Cool-down Performed as group-led instruction   Resistance Training Performed Yes   VAD Patient? No   Pain Assessment   Currently in Pain? No/denies   Multiple Pain Sites No      Capillary Blood Glucose: No results found for this or any previous visit (from the past 24 hour(s)).      Exercise Prescription Changes - 07/13/16 1300    Response to Exercise   Blood Pressure (Admit) 158/70 mmHg   Blood Pressure (Exercise) 130/66 mmHg   Blood Pressure (Exit) 108/60 mmHg   Heart Rate (Admit) 66 bpm   Heart Rate (Exercise) 74 bpm   Heart Rate (Exit) 72 bpm   Oxygen Saturation (Admit) 98 %   Oxygen Saturation (Exercise) 95 %   Oxygen Saturation (Exit) 97 %   Rating of Perceived Exertion (Exercise) 13   Perceived Dyspnea (Exercise) 2   Duration Progress to 45 minutes of aerobic exercise without signs/symptoms of physical distress   Intensity THRR unchanged   Progression   Progression Continue to progress workloads to maintain intensity without signs/symptoms of physical distress.   Resistance Training   Training Prescription Yes   Weight blue bands'   Reps 10-12  10 minutes of strength training   Interval Training   Interval Training No   NuStep   Level 5    Minutes 17   METs 2   Arm Ergometer   Level 3   Minutes 17     Goals Met:  Exercise tolerated well No report of cardiac concerns or symptoms Strength training completed today  Goals Unmet:  Not Applicable  Comments: Service time is from 10:30am to 12:30pm    Dr. Rush Farmer is Medical Director for Pulmonary Rehab at [facility].

## 2016-07-18 ENCOUNTER — Encounter (HOSPITAL_COMMUNITY)
Admission: RE | Admit: 2016-07-18 | Discharge: 2016-07-18 | Disposition: A | Discharge status: Home / Self Care | Payer: Commercial Managed Care - HMO | Source: Ambulatory Visit | Attending: Internal Medicine | Admitting: Internal Medicine

## 2016-07-18 VITALS — Wt 237.4 lb

## 2016-07-18 DIAGNOSIS — R0600 Dyspnea, unspecified: Secondary | ICD-10-CM | POA: Diagnosis not present

## 2016-07-18 DIAGNOSIS — R0689 Other abnormalities of breathing: Secondary | ICD-10-CM | POA: Diagnosis not present

## 2016-07-18 DIAGNOSIS — J4540 Moderate persistent asthma, uncomplicated: Secondary | ICD-10-CM | POA: Diagnosis not present

## 2016-07-18 NOTE — Progress Notes (Signed)
Daily Session Note  Patient Details  Name: [name] [name] MRN: [id number] Date of Birth: 12-22-1951 Referring Provider:   April Manson Pulmonary Rehab Walk Test from 05/18/2016 in [place]  Referring Provider  Dr. Lavell Anchors      Encounter Date: 07/18/2016  Check In:     Session Check In - 07/18/16 1030      Check-In   Location [facility]   Staff Present Rosebud Poles, RN, BSN;Lisa Ysidro Evert, RN;Portia Rollene Rotunda, RN, BSN;Ramon Dredge, RN, MHA;Olinty Celesta Aver, MS, ACSM CEP, Exercise Physiologist   Supervising physician immediately available to respond to emergencies [facility] immediately available   Physician(s) Dr. Marily Memos   Medication changes reported     No   Fall or balance concerns reported    No   Warm-up and Cool-down Performed as group-led instruction   Resistance Training Performed Yes   VAD Patient? No     Pain Assessment   Currently in Pain? No/denies   Multiple Pain Sites No      Capillary Blood Glucose: No results found for this or any previous visit (from the past 24 hour(s)).      Exercise Prescription Changes - 07/18/16 1200      Response to Exercise   Blood Pressure (Admit) 140/80   Blood Pressure (Exercise) 124/70   Blood Pressure (Exit) 102/68   Heart Rate (Admit) 68 bpm   Heart Rate (Exercise) 80 bpm   Heart Rate (Exit) 64 bpm   Oxygen Saturation (Admit) 98 %   Oxygen Saturation (Exercise) 95 %   Oxygen Saturation (Exit) 97 %   Rating of Perceived Exertion (Exercise) 13   Perceived Dyspnea (Exercise) 3   Duration Progress to 45 minutes of aerobic exercise without signs/symptoms of physical distress   Intensity THRR unchanged     Progression   Progression Continue to progress workloads to maintain intensity without signs/symptoms of physical distress.     Resistance Training   Training Prescription Yes   Weight blue bands'   Reps 10-12  10 minutes of strength training     Interval  Training   Interval Training No     NuStep   Level 5   Minutes 17   METs 1.7     Arm Ergometer   Level 3   Minutes 17     Track   Laps 12   Minutes 17     Goals Met:  Exercise tolerated well No report of cardiac concerns or symptoms Strength training completed today  Goals Unmet:  Not Applicable  Comments: Service time is from 10:30am to 12:15pm    Dr. Rush Farmer is Medical Director for Pulmonary Rehab at [facility].

## 2016-07-19 ENCOUNTER — Ambulatory Visit (INDEPENDENT_AMBULATORY_CARE_PROVIDER_SITE_OTHER): Payer: Commercial Managed Care - HMO | Admitting: Nurse Practitioner

## 2016-07-19 ENCOUNTER — Encounter: Payer: Self-pay | Admitting: Nurse Practitioner

## 2016-07-19 VITALS — BP 126/81 | HR 63 | Ht 69.0 in | Wt 239.4 lb

## 2016-07-19 DIAGNOSIS — R259 Unspecified abnormal involuntary movements: Secondary | ICD-10-CM

## 2016-07-19 DIAGNOSIS — I10 Essential (primary) hypertension: Secondary | ICD-10-CM | POA: Diagnosis not present

## 2016-07-19 DIAGNOSIS — G4710 Hypersomnia, unspecified: Secondary | ICD-10-CM | POA: Diagnosis not present

## 2016-07-19 DIAGNOSIS — R400 Somnolence: Secondary | ICD-10-CM

## 2016-07-19 MED ORDER — LEVETIRACETAM 750 MG PO TABS: 1500.0000 mg | ORAL_TABLET | Freq: Two times a day (BID) | ORAL | 11 refills | Status: DC

## 2016-07-19 NOTE — Patient Instructions (Addendum)
sleep deprived EEG was normal Continue Keppra at current dose Will obtain sleep study Follow up with dr. Krista Blue 6 months

## 2016-07-19 NOTE — Progress Notes (Addendum)
[facility]  PATIENT: [name] [name] DOB: 01-02-1951   REASON FOR VISIT: Follow-up for seizure-like activity, essential hypertension HISTORY FROM: Patient    HISTORY OF PRESENT ILLNESS: HISTORYYYPatient is a 65 years old male, referred by his primary care physician Dr. Annitta [name] for evaluation of recurrent right arm shaking He has past medical history of hypertension, paroxysmal A. fib asthma, GERD , hypogonadism Since 2012 he began to have stereotypical episodes of sudden onset involuntary right arm large amplitude shaking, with no right face, leg involvement, no loss of consciousness, lasting 2-3 minutes, it happened, average couple times each week, no generalized seizure  He reported that his brother, and father had similar event, was diagnosed with epilepsy Over the years, he has gradually developed feminine features, including gynecomastia, voice change, erectile dysfunction, this is under evaluation of his endocrinologist. EEG was normal MRI brain Oct 2015 there was evidence of multiple periventricular white matter disease, most likely small vessel disease  UPDATE Dec 17th 2015: YY I have started Keppra 500 mg twice a day since October 2015, he tolerated the medication very well, there was significant less episodes, most recent December 09 2014, sudden onset uncontrollable large amplitude forceful right arm shaking, no loss of consciousness, lasting for 1 minutes, Laboratory showed elevated ESR 57, positive ANA, double-stranded DNA, normal C16, folic acid, negative RPR, HIV.  UPDATE April 7th 2016YY:He still has recurrent episode about once a week, Last one was in April 3rd 2016, suddenly, he had right arm shaking, uncontrollable, it stopped in 2 minutes. NO LOC He is now taking Keppra 1083m bid, He reported, he had much less episode while taking Keppra  UPDATE Sep 15 2016YY:He is doing very well, since his Keppra dosage was increased to 750 mg 2 tablets twice  a day, he only had one recurrent right arm jerking movement, lasting for a few minutes, no loss of consciousness, but he does has mild confusion during episode, does not involving his right face or right leg.  UPDATE 03/15/2017CM [name] [name] 65year old male returns for follow-up. He continues to have stereotypical episodes of sudden onset involuntary right arm large amplitude shaking, with no right face, leg involvement, no loss of consciousness, lasting 2-3 minutes, it happened, average couple times each week, no generalized seizure. He is currently taking Keppra 1500 twice daily. A sleep deprived EEG was ordered in April of last year but this was never scheduled. Blood pressure is well controlled in the office today. MRI of the brain in 2015 without acute findings. He was admitted to the hospital back in December  for pneumonia. He still says he is weak from this. He returns for reevaluation. UPDATE 07/26/2017CM [name] [name] 65year old male returns for follow-up. He has stereotypical episodes of sudden onset involuntary right arm large amplitude shaking with no right face or leg involvement or loss of consciousness. These episodes usually last 2-3 minutes. When last seen happening several times a week he claims he has had maybe 5 events since last seen. He also tells me today he was diagnosed with obstructive apnea about 10 years ago but could not get adjusted to the mask. Sleep deprived EEG recently was normal MRI of the brain in 2015 without acute findings.He does complain of daytime drowsiness and ESS score is 10 he returns for reevaluation  REVIEW OF SYSTEMS: Full 14 system review of systems performed and notable only for those listed, all others are neg:  Constitutional: Fatigue  Cardiovascular: neg Ear/Nose/Throat: neg  Skin: neg Eyes:  neg Respiratory: Shortness of breath Gastroitestinal: neg  Hematology/Lymphatic: Anemia Endocrine: neg Musculoskeletal:neg Allergy/Immunology:  neg Neurological: Tremors Psychiatric: neg Sleep : snoring , apnea   ALLERGIES: No Known Allergies  HOME MEDICATIONS: Outpatient Medications Prior to Visit  Medication Sig Dispense Refill  . ADVAIR DISKUS 250-50 MCG/DOSE AEPB Inhale 1 puff into the lungs 2 (two) times daily. 60 each 3  . albuterol (PROVENTIL HFA;VENTOLIN HFA) 108 (90 Base) MCG/ACT inhaler Inhale 2 puffs into the lungs every 6 (six) hours as needed. 1 Inhaler 5  . albuterol (PROVENTIL) (2.5 MG/3ML) 0.083% nebulizer solution Take 3 mLs (2.5 mg total) by nebulization every 6 (six) hours as needed for wheezing or shortness of breath. 150 mL 1  . amiodarone (PACERONE) 200 MG tablet Take 1 tablet (200 mg total) by mouth 2 (two) times daily. 60 tablet 2  . aspirin EC 81 MG tablet Take 81 mg by mouth daily.      Marland Kitchen atorvastatin (LIPITOR) 40 MG tablet Take 1 tablet (40 mg total) by mouth daily at 6 PM. 30 tablet 3  . clomiPHENE (CLOMID) 50 MG tablet 1/4 tab daily 10 tablet 5  . clotrimazole-betamethasone (LOTRISONE) cream Apply 1 application topically 2 (two) times daily. (Patient taking differently: Apply 1 application topically 2 (two) times daily as needed (FOR RASH). ) 30 g 1  . colchicine 0.6 MG tablet Take 1 tablet (0.6 mg total) by mouth 2 (two) times daily. 60 tablet 3  . digoxin (LANOXIN) 0.125 MG tablet Take 1 tablet (0.125 mg total) by mouth daily. 30 tablet 3  . ferrous sulfate 325 (65 FE) MG tablet Take 325 mg by mouth daily with breakfast. Reported on 03/16/2016    . furosemide (LASIX) 20 MG tablet TAKE 1 TABLET (20 MG TOTAL) BY MOUTH DAILY. 30 tablet 0  . levETIRAcetam (KEPPRA) 750 MG tablet Take 2 tablets (1,500 mg total) by mouth 2 (two) times daily. 120 tablet 11  . lisinopril (PRINIVIL,ZESTRIL) 20 MG tablet Take 20 mg by mouth daily.     . metoprolol succinate (TOPROL-XL) 50 MG 24 hr tablet Take 50 mg by mouth daily.   3  . nitroGLYCERIN (NITROSTAT) 0.4 MG SL tablet Place 1 tablet (0.4 mg total) under the tongue  every 5 (five) minutes as needed for chest pain. 25 tablet 1  . pantoprazole (PROTONIX) 40 MG tablet Take 40 mg by mouth daily.     . tamoxifen (NOLVADEX) 10 MG tablet Take 1 tablet (10 mg total) by mouth daily. 30 tablet 11  . tamsulosin (FLOMAX) 0.4 MG CAPS capsule Take 1 capsule (0.4 mg total) by mouth daily. 30 capsule 3  . metoprolol succinate (TOPROL XL) 25 MG 24 hr tablet Take 1 tablet (25 mg total) by mouth daily. (Patient not taking: Reported on 07/19/2016) 30 tablet 3  . omeprazole (PRILOSEC) 40 MG capsule Take 1 capsule (40 mg total) by mouth 2 (two) times daily before a meal. (Patient not taking: Reported on 07/19/2016) 60 capsule 3   No facility-administered medications prior to visit.     PAST MEDICAL HISTORY: Past Medical History:  Diagnosis Date  . Anemia   . Asthma   . Atrial fibrillation ([place])   . CHF (congestive heart failure) ([place])   . COPD (chronic obstructive pulmonary disease) ([place])   . Heart disease   . Hypertension     PAST SURGICAL HISTORY: Past Surgical History:  Procedure Laterality Date  . CARDIAC CATHETERIZATION N/A 12/08/2015   Procedure: Left Heart Cath and Coronary Angiography;  Surgeon: Charolette Forward, MD;  Location: [place] [facility];  Service: Cardiovascular;  Laterality: N/A;  . ESOPHAGUS SURGERY      FAMILY HISTORY: Family History  Problem Relation Age of Onset  . Cancer Mother     SOCIAL HISTORY: Social History   Social History  . Marital status: Single    Spouse name: N/A  . Number of children: 1  . Years of education: college   Occupational History  . [employer]   Social History Main Topics  . Smoking status: Never Smoker  . Smokeless tobacco: Never Used  . Alcohol use No  . Drug use: No  . Sexual activity: Not Currently   Other Topics Concern  . Not on file   Social History Narrative   Patient lives at home alone and he is single.   Patient is semi retired.    Education college.   Right handed.    Caffeine two cokes daily.     PHYSICAL EXAM  Vitals:   07/19/16 0928  BP: 126/81  Pulse: 63  Weight: 239 lb 6.4 oz (108.6 kg)  Height: _0  (1.753 m)   Body mass index is 35.35 kg/m. Gen: NAD, conversant, well nourised, obese, well groomed  Cardiovascular: Regular rate rhythm,  Neck: Supple, no carotid bruit. Pulmonary: Clear to auscultation bilateral,   NEUROLOGICAL EXAM:  MENTAL STATUS: Speech:Speech is normal; fluent and spontaneous with normal comprehension.  Cognition:The patient is oriented to person, place, and time; recent and remote memory intact;   language fluent;  normal attention, concentration, fund of knowledge. CRANIAL NERVES: CN II: Visual fields are full to confrontation. Fundoscopic exam is normal with sharp discs   Pupils are  briskly reactive to light.. CN III, IV, VI: extraocular movement are normal. No ptosis. CN V: Facial sensation is intact to pinprick in all 3 divisions bilaterally.   CN VII: Face is symmetric with normal eye closure and smile. CN VIII: Hearing is normal to rubbing fingers CN IX, X: Palate elevates symmetrically. Phonation is normal. CN XI: Head turning and shoulder shrug are intact CN XII: Tongue is midline with normal movements and no atrophy.  MOTOR:There is no pronator drift of out-stretched arms. Muscle bulk and tone are normal. Muscle strength is normal. REFLEXES:Reflexes are 2+ and symmetric at the biceps, triceps, knees, and ankles. Plantar responses are flexor. SENSORY:Light touch, pinprick, position sense, and vibration sense are intact in fingers and toes. COORDINATION:Rapid alternating movements and fine finger movements are intact. There is no dysmetria on finger-to-nose and heel-knee-shin.  GAIT/STANCE: Gait is steady with normal steps, base, arm swing, and turning. Heel and toe walking are normal. Tandem gait is normal. No assistive device Romberg is absent.     DIAGNOSTIC DATA (LABS,  IMAGING, TESTING) - I reviewed patient records, labs, notes, testing and imaging myself where available.  Lab Results  Component Value Date   WBC 7.8 01/23/2016   HGB 10.1 (L) 01/23/2016   HCT 33.5 (L) 01/23/2016   MCV 66.7 (L) 01/23/2016   PLT 454 (H) 01/23/2016      Component Value Date/Time   NA 142 01/23/2016 1515   K 4.2 01/23/2016 1515   CL 105 01/23/2016 1515   CO2 29 01/23/2016 1515   GLUCOSE 103 (H) 01/23/2016 1515   BUN 7 01/23/2016 1515   CREATININE 1.21 01/23/2016 1515   CREATININE 1.06 03/12/2015 1015   CALCIUM 8.8 (L) 01/23/2016 1515   PROT 7.1 12/15/2015 1125   ALBUMIN 1.8 (  L) 12/15/2015 1125   AST 62 (H) 12/15/2015 1125   ALT 54 12/15/2015 1125   ALKPHOS 182 (H) 12/15/2015 1125   BILITOT 0.5 12/15/2015 1125   GFRNONAA >60 01/23/2016 1515   GFRNONAA 73 03/12/2015 1015   GFRAA >60 01/23/2016 1515   GFRAA 84 03/12/2015 1015   Lab Results  Component Value Date   CHOL 144 12/09/2015   HDL 57 12/09/2015   LDLCALC 76 12/09/2015   TRIG 53 12/09/2015   CHOLHDL 2.5 12/09/2015   Lab Results  Component Value Date   HGBA1C 5.8 (H) 12/08/2015    Lab Results  Component Value Date   TSH 1.167 01/23/2016      ASSESSMENT AND PLAN  65 y.o. year old male  has a past medical history of  recurrent episode of uncontrollable right arm shaking, suggestive of simple partial seizure, responded to Keppra , significant decrease of recurrent episodes on titrating dose of Keppra. Sleep deprived EEG Normal . Patient states he has a history of obstructive sleep apnea 10 years ago and never used CPAP. He complains with daytime sleepiness and fatigue.The patient is a current patient of Dr. Krista Blue  who is out of the office today . This note is sent to the work in doctor.     Continue Keppra at current dose Will obtain sleep study I explained in particular the risks and ramifications of untreated moderate to severe OSA, especially with respect to cardiovascular disease  including  congestive heart failure, difficult to treat hypertension, cardiac arrhythmias, or stroke. Even type 2 diabetes has, in part, been linked to untreated OSA. Symptoms of untreated OSA include daytime sleepiness, memory problems, mood irritability and mood disorder such as depression and anxiety, lack of energy, as well as recurrent headaches, especially morning headaches. Keep a scheduled bedtime and wake time routine, do not skip any meals and eat healthy snacks in between meals Follow up with Dr. Krista Blue 6 monthsVst time 25 min Dennie Bible, [facility], [facility], [facility] [address] [name], [place] [address] [phone]   Personally  participated in and made any corrections needed to history, physical, neuro exam,assessment and plan as stated above.   Sarina Ill, MD [facility]

## 2016-07-20 ENCOUNTER — Other Ambulatory Visit: Payer: Self-pay

## 2016-07-20 ENCOUNTER — Encounter (HOSPITAL_COMMUNITY)
Admission: RE | Admit: 2016-07-20 | Discharge: 2016-07-20 | Disposition: A | Discharge status: Home / Self Care | Payer: Commercial Managed Care - HMO | Source: Ambulatory Visit | Attending: Internal Medicine | Admitting: Internal Medicine

## 2016-07-20 DIAGNOSIS — R0600 Dyspnea, unspecified: Secondary | ICD-10-CM

## 2016-07-20 DIAGNOSIS — R0689 Other abnormalities of breathing: Secondary | ICD-10-CM | POA: Diagnosis not present

## 2016-07-20 DIAGNOSIS — J4540 Moderate persistent asthma, uncomplicated: Secondary | ICD-10-CM | POA: Diagnosis not present

## 2016-07-20 NOTE — Patient Outreach (Signed)
[facility]) Care Management  [place]  07/20/2016   [name] [name] December 14, 1951 [id number]  Subjective: Telephone call to patient for monthly call. Patient reports he is doing ok.  He reports pulmonary rehab has been going good and states he knows that it takes work and time.  Patient reports last weight was 238 lbs.  Patient denies any swelling or shortness of breath.  Discussed with patient heart failure and when to notify physician.  He verbalized understanding.    Objective:   Encounter Medications:  Outpatient Encounter Prescriptions as of 07/20/2016  Medication Sig Note  . ADVAIR DISKUS 250-50 MCG/DOSE AEPB Inhale 1 puff into the lungs 2 (two) times daily.   Marland Kitchen albuterol (PROVENTIL HFA;VENTOLIN HFA) 108 (90 Base) MCG/ACT inhaler Inhale 2 puffs into the lungs every 6 (six) hours as needed.   Marland Kitchen albuterol (PROVENTIL) (2.5 MG/3ML) 0.083% nebulizer solution Take 3 mLs (2.5 mg total) by nebulization every 6 (six) hours as needed for wheezing or shortness of breath.   Marland Kitchen amiodarone (PACERONE) 200 MG tablet Take 1 tablet (200 mg total) by mouth 2 (two) times daily.   Marland Kitchen aspirin EC 81 MG tablet Take 81 mg by mouth daily.     Marland Kitchen atorvastatin (LIPITOR) 40 MG tablet Take 1 tablet (40 mg total) by mouth daily at 6 PM.   . clomiPHENE (CLOMID) 50 MG tablet 1/4 tab daily   . clotrimazole-betamethasone (LOTRISONE) cream Apply 1 application topically 2 (two) times daily. (Patient taking differently: Apply 1 application topically 2 (two) times daily as needed (FOR RASH). )   . colchicine 0.6 MG tablet Take 1 tablet (0.6 mg total) by mouth 2 (two) times daily.   . digoxin (LANOXIN) 0.125 MG tablet Take 1 tablet (0.125 mg total) by mouth daily.   . ferrous sulfate 325 (65 FE) MG tablet Take 325 mg by mouth daily with breakfast. Reported on 03/16/2016 02/07/2016: Will pick it up over the counter per patient.  . furosemide (LASIX) 20 MG tablet TAKE 1 TABLET (20 MG TOTAL) BY MOUTH DAILY.    Marland Kitchen levETIRAcetam (KEPPRA) 750 MG tablet Take 2 tablets (1,500 mg total) by mouth 2 (two) times daily.   Marland Kitchen lisinopril (PRINIVIL,ZESTRIL) 20 MG tablet Take 20 mg by mouth daily.    . metoprolol succinate (TOPROL-XL) 50 MG 24 hr tablet Take 50 mg by mouth daily.  05/03/2016: Received from: External Pharmacy  . nitroGLYCERIN (NITROSTAT) 0.4 MG SL tablet Place 1 tablet (0.4 mg total) under the tongue every 5 (five) minutes as needed for chest pain.   . pantoprazole (PROTONIX) 40 MG tablet Take 40 mg by mouth daily.  03/08/2016: Received from: External Pharmacy  . tamoxifen (NOLVADEX) 10 MG tablet Take 1 tablet (10 mg total) by mouth daily.   . tamsulosin (FLOMAX) 0.4 MG CAPS capsule Take 1 capsule (0.4 mg total) by mouth daily.    No facility-administered encounter medications on file as of 07/20/2016.     Functional Status:  In your present state of health, do you have any difficulty performing the following activities: 04/27/2016 03/23/2016  Hearing? N N  Vision? N N  Difficulty concentrating or making decisions? N N  Walking or climbing stairs? N N  Dressing or bathing? N N  Doing errands, shopping? N N  Preparing Food and eating ? N N  Using the Toilet? N N  In the past six months, have you accidently leaked urine? N N  Do you have problems with loss of  bowel control? N N  Managing your Medications? N N  Managing your Finances? N N  Housekeeping or managing your Housekeeping? N N  Some recent data might be hidden    Fall/Depression Screening: PHQ 2/9 Scores 07/20/2016 05/24/2016 05/08/2016 04/27/2016 03/23/2016 03/21/2016 03/16/2016  PHQ - 2 Score 0 0 0 0 0 0 0    Assessment: Patient continues to benefit from health coach outreach for disease management and support.    Plan:  [facility] CM Care Plan Problem One   Flowsheet Row Most Recent Value  Care Plan Problem One  Heart failure knowledge deficit  Role Documenting the Problem One  [place] for Problem One  Active  [facility] Long  Term Goal (31-90 days)  Patient will be able to identify heart failure zones within 90 days.    [facility] Long Term Goal Start Date  07/20/16 [name] continued]  Interventions for Problem One Long Term Goal  RN Health Coach reinforced with patient heart failure zones and when to notify physician.       RN Health Coach will contact patient in the month of August and patient agrees to next outreach.  Jone Baseman, RN, MSN [place] [phone]

## 2016-07-20 NOTE — Progress Notes (Signed)
Daily Session Note  Patient Details  Name: [name] [name] MRN: [id number] Date of Birth: 1951/06/28 Referring Provider:   April Manson Pulmonary Rehab Walk Test from 05/18/2016 in [place]  Referring Provider  Dr. Lavell Anchors      Encounter Date: 07/20/2016  Check In:     Session Check In - 07/20/16 1028      Check-In   Location [facility]   Staff Present Rosebud Poles, RN, Luisa Hart, RN, BSN;Ramon Dredge, RN, MHA;Molly diVincenzo, MS, ACSM RCEP, Exercise Physiologist   Supervising physician immediately available to respond to emergencies [facility] immediately available   Physician(s) Dr. Waldron Labs   Medication changes reported     No   Fall or balance concerns reported    No   Warm-up and Cool-down Performed as group-led instruction   Resistance Training Performed Yes   VAD Patient? No     Pain Assessment   Currently in Pain? No/denies   Multiple Pain Sites No      Capillary Blood Glucose: No results found for this or any previous visit (from the past 24 hour(s)).      Exercise Prescription Changes - 07/20/16 1200      Response to Exercise   Blood Pressure (Admit) 110/60   Blood Pressure (Exercise) 122/66   Blood Pressure (Exit) 110/76   Heart Rate (Admit) 64 bpm   Heart Rate (Exercise) 65 bpm   Heart Rate (Exit) 64 bpm   Oxygen Saturation (Admit) 98 %   Oxygen Saturation (Exercise) 99 %   Oxygen Saturation (Exit) 93 %   Rating of Perceived Exertion (Exercise) 13   Perceived Dyspnea (Exercise) 2   Duration Progress to 45 minutes of aerobic exercise without signs/symptoms of physical distress   Intensity THRR unchanged     Progression   Progression Continue to progress workloads to maintain intensity without signs/symptoms of physical distress.     Resistance Training   Training Prescription Yes   Weight blue bands'   Reps 10-12  10 minutes of strength training     Interval Training   Interval Training No     NuStep   Level 5   Minutes 17   METs 1.7     Track   Laps 7   Minutes 17     Goals Met:  Improved SOB with ADL's Exercise tolerated well Strength training completed today  Goals Unmet:  Not Applicable  Comments: Service time is from 1030 to 1210    Dr. Rush Farmer is Medical Director for Pulmonary Rehab at [facility].

## 2016-07-22 ENCOUNTER — Other Ambulatory Visit: Payer: Self-pay | Admitting: Endocrinology

## 2016-07-25 ENCOUNTER — Encounter (HOSPITAL_COMMUNITY)
Admission: RE | Admit: 2016-07-25 | Discharge: 2016-07-25 | Disposition: A | Discharge status: Home / Self Care | Payer: Commercial Managed Care - HMO | Source: Ambulatory Visit | Attending: Internal Medicine | Admitting: Internal Medicine

## 2016-07-25 VITALS — Wt 244.3 lb

## 2016-07-25 DIAGNOSIS — R0689 Other abnormalities of breathing: Secondary | ICD-10-CM | POA: Insufficient documentation

## 2016-07-25 DIAGNOSIS — J4540 Moderate persistent asthma, uncomplicated: Secondary | ICD-10-CM | POA: Diagnosis not present

## 2016-07-25 DIAGNOSIS — R0600 Dyspnea, unspecified: Secondary | ICD-10-CM | POA: Diagnosis not present

## 2016-07-25 NOTE — Progress Notes (Signed)
Daily Session Note  Patient Details  Name: [name] [name] MRN: [id number] Date of Birth: 07/27/1951 Referring Provider:   April Manson Pulmonary Rehab Walk Test from 05/18/2016 in [place]  Referring Provider  Dr. Lavell Anchors      Encounter Date: 07/25/2016  Check In:     Session Check In - 07/25/16 1030      Check-In   Location [facility]   Staff Present Rosebud Poles, RN, BSN;Lisa Ysidro Evert, RN;Portia Rollene Rotunda, RN, BSN;Ramon Dredge, RN, MHA;Deontez Klinke, MS, ACSM RCEP, Exercise Physiologist   Supervising physician immediately available to respond to emergencies [facility] immediately available   Physician(s) Dr. Marily Memos   Medication changes reported     No   Fall or balance concerns reported    No   Warm-up and Cool-down Performed as group-led instruction   Resistance Training Performed Yes   VAD Patient? No     Pain Assessment   Currently in Pain? No/denies   Multiple Pain Sites No      Capillary Blood Glucose: No results found for this or any previous visit (from the past 24 hour(s)).      Exercise Prescription Changes - 07/25/16 1200      Exercise Review   Progression Yes     Response to Exercise   Blood Pressure (Admit) 120/62   Blood Pressure (Exercise) 122/70   Blood Pressure (Exit) 112/66   Heart Rate (Admit) 68 bpm   Heart Rate (Exercise) 83 bpm   Heart Rate (Exit) 64 bpm   Oxygen Saturation (Admit) 98 %   Oxygen Saturation (Exercise) 96 %   Oxygen Saturation (Exit) 97 %   Rating of Perceived Exertion (Exercise) 14   Perceived Dyspnea (Exercise) 2   Duration Progress to 45 minutes of aerobic exercise without signs/symptoms of physical distress   Intensity THRR unchanged     Progression   Progression Continue to progress workloads to maintain intensity without signs/symptoms of physical distress.     Resistance Training   Training Prescription Yes   Weight blue bands'   Reps 10-12  10  minutes of strength training     Interval Training   Interval Training No     NuStep   Level 5   Minutes 17   METs 1.5     Arm Ergometer   Level 4   Minutes 17     Track   Laps 9   Minutes 17     Goals Met:  Exercise tolerated well No report of cardiac concerns or symptoms Strength training completed today  Goals Unmet:  Not Applicable  Comments: Service time is from 10:30AM to 12:00PM    Dr. Rush Farmer is Medical Director for Pulmonary Rehab at [facility].

## 2016-07-27 ENCOUNTER — Encounter (HOSPITAL_COMMUNITY): Payer: Commercial Managed Care - HMO

## 2016-07-27 ENCOUNTER — Encounter (HOSPITAL_COMMUNITY)
Admission: RE | Admit: 2016-07-27 | Discharge: 2016-07-27 | Disposition: A | Discharge status: Home / Self Care | Payer: Commercial Managed Care - HMO | Source: Ambulatory Visit | Attending: Internal Medicine | Admitting: Internal Medicine

## 2016-07-27 VITALS — Wt 243.8 lb

## 2016-07-27 DIAGNOSIS — R0600 Dyspnea, unspecified: Secondary | ICD-10-CM | POA: Diagnosis not present

## 2016-07-27 DIAGNOSIS — J4540 Moderate persistent asthma, uncomplicated: Secondary | ICD-10-CM | POA: Diagnosis not present

## 2016-07-27 DIAGNOSIS — R0689 Other abnormalities of breathing: Secondary | ICD-10-CM | POA: Diagnosis not present

## 2016-07-27 NOTE — Progress Notes (Signed)
Daily Session Note  Patient Details  Name: [name] [name] MRN: [id number] Date of Birth: 06-26-1951 Referring Provider:   April Manson Pulmonary Rehab Walk Test from 05/18/2016 in [place]  Referring Provider  Dr. Lavell Anchors      Encounter Date: 07/27/2016  Check In:     Session Check In - 07/27/16 1217      Check-In   [facility]   Staff Present Rosebud Poles, RN, BSN;Ronny Ruddell, MS, ACSM RCEP, Exercise Physiologist;Portia Rollene Rotunda, RN, Roque Cash, RN   Supervising physician immediately available to respond to emergencies [facility] immediately available   Physician(s) Dr. Marily Memos   Medication changes reported     No   Fall or balance concerns reported    No   Warm-up and Cool-down Performed as group-led instruction   Resistance Training Performed Yes   VAD Patient? No     Pain Assessment   Currently in Pain? No/denies   Multiple Pain Sites No      Capillary Blood Glucose: No results found for this or any previous visit (from the past 24 hour(s)).      Exercise Prescription Changes - 07/27/16 1200      Exercise Review   Progression Yes     Response to Exercise   Blood Pressure (Admit) 120/60   Blood Pressure (Exercise) 148/70   Blood Pressure (Exit) 100/66   Heart Rate (Admit) 88 bpm   Heart Rate (Exercise) 80 bpm   Heart Rate (Exit) 69 bpm   Oxygen Saturation (Admit) 95 %   Oxygen Saturation (Exercise) 99 %   Oxygen Saturation (Exit) 97 %   Rating of Perceived Exertion (Exercise) 12   Perceived Dyspnea (Exercise) 1   Duration Progress to 45 minutes of aerobic exercise without signs/symptoms of physical distress   Intensity THRR unchanged     Progression   Progression Continue to progress workloads to maintain intensity without signs/symptoms of physical distress.     Resistance Training   Training Prescription Yes   Weight blue bands'   Reps 10-12  10 minutes of strength training      Interval Training   Interval Training No     Arm Ergometer   Level 5   Minutes 17     Track   Laps 14   Minutes 17     Goals Met:  Exercise tolerated well No report of cardiac concerns or symptoms Strength training completed today  Goals Unmet:  Not Applicable  Comments: Service time is from 10:30am to 12:15pm. Patient attended education today on Meditation with Jeanella Craze.    Dr. Rush Farmer is Medical Director for Pulmonary Rehab at [facility].

## 2016-08-01 ENCOUNTER — Encounter (HOSPITAL_COMMUNITY)
Admission: RE | Admit: 2016-08-01 | Discharge: 2016-08-01 | Disposition: A | Discharge status: Home / Self Care | Payer: Commercial Managed Care - HMO | Source: Ambulatory Visit | Attending: Internal Medicine | Admitting: Internal Medicine

## 2016-08-01 VITALS — Wt 241.4 lb

## 2016-08-01 DIAGNOSIS — R0600 Dyspnea, unspecified: Secondary | ICD-10-CM

## 2016-08-01 DIAGNOSIS — J4540 Moderate persistent asthma, uncomplicated: Secondary | ICD-10-CM | POA: Diagnosis not present

## 2016-08-01 DIAGNOSIS — R0689 Other abnormalities of breathing: Secondary | ICD-10-CM | POA: Diagnosis not present

## 2016-08-01 NOTE — Progress Notes (Signed)
Daily Session Note  Patient Details  Name: [name] [name] MRN: [id number] Date of Birth: 28-Dec-1950 Referring Provider:   April Manson Pulmonary Rehab Walk Test from 05/18/2016 in [place]  Referring Provider  Dr. Lavell Anchors      Encounter Date: 08/01/2016  Check In:     Session Check In - 08/01/16 1058      Check-In   [facility]   Staff Present Rosebud Poles, RN, BSN;Molly diVincenzo, MS, ACSM RCEP, Exercise Physiologist;Lisa Ysidro Evert, Felipe Drone, RN, [facility]   Supervising physician immediately available to respond to emergencies [facility] immediately available   Physician(s) Dr. Marily Memos    Medication changes reported     No   Fall or balance concerns reported    No   Warm-up and Cool-down Performed as group-led instruction   Resistance Training Performed Yes     Pain Assessment   Currently in Pain? No/denies      Capillary Blood Glucose: No results found for this or any previous visit (from the past 24 hour(s)).      Exercise Prescription Changes - 08/01/16 1200      Response to Exercise   Blood Pressure (Admit) 148/70   Blood Pressure (Exercise) 100/60   Blood Pressure (Exit) 140/60   Heart Rate (Admit) 77 bpm   Heart Rate (Exercise) 92 bpm   Heart Rate (Exit) 76 bpm   Oxygen Saturation (Admit) 100 %   Oxygen Saturation (Exercise) 99 %   Oxygen Saturation (Exit) 96 %   Rating of Perceived Exertion (Exercise) 12   Perceived Dyspnea (Exercise) 2   Duration Progress to 45 minutes of aerobic exercise without signs/symptoms of physical distress   Intensity THRR unchanged     Progression   Progression Continue to progress workloads to maintain intensity without signs/symptoms of physical distress.     Resistance Training   Training Prescription Yes   Weight blue bands'   Reps 10-12  10 minutes of strength training     Interval Training   Interval Training No     NuStep   Level 5   Minutes 17   METs 1.9     Arm Ergometer   Level 5   Minutes 17     Track   Laps 12   Minutes 17     Goals Met:  [place] with exercise equipment Improved SOB with ADL's Using PLB without cueing & demonstrates good technique Exercise tolerated well Strength training completed today  Goals Unmet:  Not Applicable  Comments: Service time is from 1030 to 1145    Dr. Rush Farmer is Medical Director for Pulmonary Rehab at [facility].

## 2016-08-02 ENCOUNTER — Encounter: Payer: Self-pay | Admitting: Neurology

## 2016-08-02 ENCOUNTER — Ambulatory Visit (INDEPENDENT_AMBULATORY_CARE_PROVIDER_SITE_OTHER): Payer: Commercial Managed Care - HMO | Admitting: Neurology

## 2016-08-02 VITALS — BP 132/72 | HR 78 | Resp 26 | Ht 69.0 in | Wt 241.0 lb

## 2016-08-02 DIAGNOSIS — R351 Nocturia: Secondary | ICD-10-CM | POA: Diagnosis not present

## 2016-08-02 DIAGNOSIS — G4733 Obstructive sleep apnea (adult) (pediatric): Secondary | ICD-10-CM | POA: Diagnosis not present

## 2016-08-02 DIAGNOSIS — R51 Headache: Secondary | ICD-10-CM

## 2016-08-02 DIAGNOSIS — E669 Obesity, unspecified: Secondary | ICD-10-CM

## 2016-08-02 DIAGNOSIS — R519 Headache, unspecified: Secondary | ICD-10-CM

## 2016-08-02 DIAGNOSIS — G4710 Hypersomnia, unspecified: Secondary | ICD-10-CM

## 2016-08-02 NOTE — Progress Notes (Signed)
Subjective:    Patient ID: [name] is a 65 y.o. male.  HPI     [name] Age, MD, PhD [facility] [address] [place], [place] [address]  Dear [name] [name] and [name] [name],   I saw your patient, [name] [name], upon your kind request in my clinic today for initial consultation of his sleep disorder, in particular, reevaluation of his obstructive sleep apnea. The patient is unaccompanied today. As you know, [name] [name] is a 65 year old right-handed gentleman with an underlying medical history of hypertension, hyperlipidemia, A. fib, CHF, COPD, gout, partial seizures, and obesity, who was previously diagnosed with obstructive sleep apnea in the moderate degree in 2011. Study was done at [facility] on 07/29/10, which was interpreted by [name] [name]. He had a split-night sleep study and I reviewed the results. Baseline AHI was 18 per hour, CPAP titrated to 13 cm with an AHI of 0 per hour. Moderate snoring was noted, O2 nadir was 91%. He was placed on CPAP therapy at 13 cm. Patient reports that he used CPAP for some time, maybe a few weeks, but stopped using it years ago. He weighed 242 lb at the time.  He reports snoring and excessive daytime somnolence. I reviewed your office note from 07/19/2016. His Epworth sleepiness score is 9 out of 24 today, his fatigue score is 42 out of 63. He does not smoke or drink alcohol. He lives alone. He has 1 grown child. He goes to bed around 10:30 to 11 PM, and TV is on all night. Wake up time is between 5:30 and 6 AM.  He has nocturia about 1/night, no RLS, has rare AM HAs.  He does not drink coffee but drinks soda about 32 oz per day, and sweet tea about 32 oz per day. He is retired, single. He sees Dr. Glennon [name] in cardiology.  His Past Medical History Is Significant For: Past Medical History:  Diagnosis Date  . Anemia   . Asthma   . Atrial fibrillation ([place])   . CHF (congestive heart failure) ([place])   . COPD  (chronic obstructive pulmonary disease) ([place])   . Heart disease   . Hypertension     His Past Surgical History Is Significant For: Past Surgical History:  Procedure Laterality Date  . CARDIAC CATHETERIZATION N/A 12/08/2015   Procedure: Left Heart Cath and Coronary Angiography;  Surgeon: [name] Forward, MD;  Location: [place] [facility];  Service: Cardiovascular;  Laterality: N/A;  . ESOPHAGUS SURGERY      His Family History Is Significant For: Family History  Problem Relation [name] of Onset  . Cancer Mother     His Social History Is Significant For: Social History   Social History  . Marital status: Single    Spouse name: N/A  . Number of children: 1  . Years of education: college   Occupational History  . [employer]   Social History Main Topics  . Smoking status: Never Smoker  . Smokeless tobacco: Never Used  . Alcohol use No  . Drug use: No  . Sexual activity: Not Currently   Other Topics Concern  . None   Social History Narrative   Patient lives at home alone and he is single.   Patient is semi retired.    Education college.   Right handed.   Caffeine two cokes daily.    His Allergies Are:  No Known Allergies:   His Current Medications Are:  Outpatient Encounter Prescriptions as of 08/02/2016  Medication Sig  . ADVAIR DISKUS 250-50 MCG/DOSE AEPB Inhale 1 puff into the lungs 2 (two) times daily.  Marland Kitchen albuterol (PROVENTIL HFA;VENTOLIN HFA) 108 (90 Base) MCG/ACT inhaler Inhale 2 puffs into the lungs every 6 (six) hours as needed.  Marland Kitchen albuterol (PROVENTIL) (2.5 MG/3ML) 0.083% nebulizer solution Take 3 mLs (2.5 mg total) by nebulization every 6 (six) hours as needed for wheezing or shortness of breath.  Marland Kitchen amiodarone (PACERONE) 200 MG tablet Take 1 tablet (200 mg total) by mouth 2 (two) times daily.  Marland Kitchen aspirin EC 81 MG tablet Take 81 mg by mouth daily.    Marland Kitchen atorvastatin (LIPITOR) 40 MG tablet Take 1 tablet (40 mg total) by mouth daily at 6 PM.  .  clomiPHENE (CLOMID) 50 MG tablet 1/4 tab daily  . clotrimazole-betamethasone (LOTRISONE) cream Apply 1 application topically 2 (two) times daily. (Patient taking differently: Apply 1 application topically 2 (two) times daily as needed (FOR RASH). )  . colchicine 0.6 MG tablet Take 1 tablet (0.6 mg total) by mouth 2 (two) times daily.  . digoxin (LANOXIN) 0.125 MG tablet Take 1 tablet (0.125 mg total) by mouth daily.  . ferrous sulfate 325 (65 FE) MG tablet Take 325 mg by mouth daily with breakfast. Reported on 03/16/2016  . furosemide (LASIX) 20 MG tablet TAKE 1 TABLET (20 MG TOTAL) BY MOUTH DAILY.  Marland Kitchen levETIRAcetam (KEPPRA) 750 MG tablet Take 2 tablets (1,500 mg total) by mouth 2 (two) times daily.  Marland Kitchen lisinopril (PRINIVIL,ZESTRIL) 20 MG tablet Take 20 mg by mouth daily.   . metoprolol succinate (TOPROL-XL) 50 MG 24 hr tablet Take 50 mg by mouth daily.   . nitroGLYCERIN (NITROSTAT) 0.4 MG SL tablet Place 1 tablet (0.4 mg total) under the tongue every 5 (five) minutes as needed for chest pain.  . pantoprazole (PROTONIX) 40 MG tablet Take 40 mg by mouth daily.   . tamoxifen (NOLVADEX) 10 MG tablet Take 1 tablet (10 mg total) by mouth daily.  . tamsulosin (FLOMAX) 0.4 MG CAPS capsule Take 1 capsule (0.4 mg total) by mouth daily.   No facility-administered encounter medications on file as of 08/02/2016.   :  Review of Systems:  Out of a complete 14 point review of systems, all are reviewed and negative with the exception of these symptoms as listed below: Review of Systems  Neurological:       Patient wakes up about 3-4 am to go to bathroom, snoring, patient will wake up in the night with dizziness, wakes up feeling tired, daytime tiredness, takes naps.  Has had prior sleep study at facility near [facility], thinks it was done in the 2011. Used CPAP for short while but says that he has not used it in years.    Epworth Sleepiness Scale 0= would never doze 1= slight chance of dozing 2= moderate  chance of dozing 3= high chance of dozing  Sitting and reading:1 Watching TV:2 Sitting inactive in a public place (ex. Theater or meeting):2 As a passenger in a car for an hour without a break:1 Lying down to rest in the afternoon:1 Sitting and talking to someone:1 Sitting quietly after lunch (no alcohol):1 In a car, while stopped in traffic:0 Total:9  Objective:  Neurologic Exam  Physical Exam Physical Examination:   Vitals:   08/02/16 1014  BP: 132/72  Pulse: 78  Resp: (!) 26    General Examination: The patient is a very pleasant 65 y.o. male in  no acute distress. He appears well-developed and well-nourished and adequately groomed.   HEENT: Normocephalic, atraumatic, pupils are equal, round and reactive to light and accommodation. Funduscopic exam is normal with sharp disc margins noted. Extraocular tracking is good without limitation to gaze excursion or nystagmus noted. Normal smooth pursuit is noted. Hearing is grossly intact. Tympanic membranes are clear bilaterally. Face is symmetric with normal facial animation and normal facial sensation. Speech is clear with no dysarthria noted. There is no hypophonia. There is no lip, neck/head, jaw or voice tremor. Neck is supple with full range of passive and active motion. There are no carotid bruits on auscultation. Oropharynx exam reveals: mild mouth dryness, poor dental hygiene with multiple missing teeth and partial plate on top, and marked airway crowding, due to large tongue, redundant soft palate. Tonsils are small. Mallampati is class III. Tongue protrudes centrally and palate elevates symmetrically. He has an underbite. Neck size is 16.5 inches.   Chest: Clear to auscultation without wheezing, rhonchi or crackles noted.  Heart: S1+S2+0, regular and normal without murmurs, rubs or gallops noted.   Abdomen: Soft, non-tender and non-distended with normal bowel sounds appreciated on auscultation.  Extremities: There is 1+  pitting edema in the distal lower extremities bilaterally.   Skin: Warm and dry without trophic changes noted.   Musculoskeletal: exam reveals no obvious joint deformities, tenderness or joint swelling or erythema.   Neurologically:  Mental status: The patient is awake, alert and oriented in all 4 spheres. His immediate and remote memory, attention, language skills and fund of knowledge are appropriate. There is no evidence of aphasia, agnosia, apraxia or anomia. Speech is clear with normal prosody and enunciation. Thought process is linear. Mood is normal and affect is normal.  Cranial nerves II - XII are as described above under HEENT exam. In addition: shoulder shrug is normal with equal shoulder height noted. Motor exam: Normal bulk, strength and tone is noted. There is no drift, tremor or rebound. Romberg is negative. Reflexes are 1+ throughout. Fine motor skills and coordination: intact with normal finger taps, normal hand movements, normal rapid alternating patting, normal foot taps and normal foot agility.  Cerebellar testing: No dysmetria or intention tremor on finger to nose testing. Heel to shin is unremarkable bilaterally. There is no truncal or gait ataxia.  Sensory exam: intact to light touch, pinprick, vibration, temperature sense in the UEs and LEs.   Gait, station and balance: He stands with difficulty. No veering to one side is noted. No leaning to one side is noted. Posture is [name]-appropriate and stance is narrow based. Gait shows cautious gait and tandem walk is difficult for him.   Assessment and Plan:   In summary, [name] [name] is a very pleasant 65 y.o.-year old male with an underlying medical history of hypertension, hyperlipidemia, A. fib, CHF, COPD, gout, partial seizures, and obesity, who presents for reevaluation of his prior Dx of obstructive sleep apnea (OSA), carries a prior Dx of moderate OSA, c/o EDS and snoring. I had a long chat with the patient about my findings  and the diagnosis of OSA, its prognosis and treatment options. We talked about medical treatments, surgical interventions and non-pharmacological approaches. I explained in particular the risks and ramifications of untreated moderate to severe OSA, especially with respect to developing cardiovascular disease down the Road, including congestive heart failure, difficult to treat hypertension, cardiac arrhythmias, or stroke. Even type 2 diabetes has, in part, been linked to untreated OSA. Symptoms of untreated OSA  include daytime sleepiness, memory problems, mood irritability and mood disorder such as depression and anxiety, lack of energy, as well as recurrent headaches, especially morning headaches. We talked about trying to maintain a healthy lifestyle in general, as well as the importance of weight control. I encouraged the patient to eat healthy, exercise daily and keep well hydrated, to keep a scheduled bedtime and wake time routine, to not skip any meals and eat healthy snacks in between meals. I advised the patient not to drive when feeling sleepy. I recommended the following at this time: sleep study with potential positive airway pressure titration. (We will score hypopneas at 4% and split the sleep study into diagnostic and treatment portion, if the estimated. 2 hour AHI is >15/h).   I explained the sleep test procedure to the patient and also outlined possible surgical and non-surgical treatment options of OSA, including the use of a custom-made dental device (which would require a referral to a specialist dentist or oral surgeon), upper airway surgical options, such as pillar implants, radiofrequency surgery, tongue base surgery, and UPPP (which would involve a referral to an ENT surgeon). Rarely, jaw surgery such as mandibular advancement may be considered.  I also explained the CPAP treatment option to the patient, who indicated that he would be willing to try CPAP again if the need arises. I  explained the importance of being compliant with PAP treatment, not only for insurance purposes but primarily to improve His symptoms, and for the patient's long term health benefit, including to reduce His cardiovascular risks. I answered all his questions today and the patient was in agreement. I would like to see him back after the sleep study is completed and encouraged him to call with any interim questions, concerns, problems or updates.   Thank you very much for allowing me to participate in the care of this nice patient. If I can be of any further assistance to you please do not hesitate to talk to me.   Sincerely,   [name] Age, MD, PhD

## 2016-08-02 NOTE — Patient Instructions (Signed)

## 2016-08-03 ENCOUNTER — Encounter (HOSPITAL_COMMUNITY)
Admission: RE | Admit: 2016-08-03 | Discharge: 2016-08-03 | Disposition: A | Discharge status: Home / Self Care | Payer: Commercial Managed Care - HMO | Source: Ambulatory Visit | Attending: Internal Medicine | Admitting: Internal Medicine

## 2016-08-03 VITALS — Wt 241.6 lb

## 2016-08-03 DIAGNOSIS — R0689 Other abnormalities of breathing: Secondary | ICD-10-CM | POA: Diagnosis not present

## 2016-08-03 DIAGNOSIS — R0600 Dyspnea, unspecified: Secondary | ICD-10-CM

## 2016-08-03 DIAGNOSIS — J4540 Moderate persistent asthma, uncomplicated: Secondary | ICD-10-CM | POA: Diagnosis not present

## 2016-08-03 NOTE — Progress Notes (Signed)
Daily Session Note  Patient Details  Name: [name] [name] MRN: [id number] Date of Birth: 20-Apr-1951 Referring Provider:   April Manson Pulmonary Rehab Walk Test from 05/18/2016 in [place]  Referring Provider  Dr. Lavell Anchors      Encounter Date: 08/03/2016  Check In:     Session Check In - 08/03/16 1036      Check-In   Location [facility]   Staff Present Su Hilt, MS, ACSM RCEP, Exercise Physiologist;Joan Leonia Reeves, RN, Roque Cash, RN;Other   Supervising physician immediately available to respond to emergencies [facility] immediately available   Physician(s) Dr. Marthenia Rolling   Medication changes reported     No   Fall or balance concerns reported    No   Warm-up and Cool-down Performed as group-led instruction   Resistance Training Performed Yes   VAD Patient? No     Pain Assessment   Currently in Pain? No/denies   Multiple Pain Sites No      Capillary Blood Glucose: No results found for this or any previous visit (from the past 24 hour(s)).      Exercise Prescription Changes - 08/03/16 1200      Exercise Review   Progression Yes     Response to Exercise   Blood Pressure (Admit) 108/74   Blood Pressure (Exercise) 126/70   Blood Pressure (Exit) 120/80   Heart Rate (Admit) 61 bpm   Heart Rate (Exercise) 96 bpm   Heart Rate (Exit) 73 bpm   Oxygen Saturation (Admit) 98 %   Oxygen Saturation (Exercise) 98 %   Oxygen Saturation (Exit) 99 %   Rating of Perceived Exertion (Exercise) 12   Perceived Dyspnea (Exercise) 2   Duration Progress to 45 minutes of aerobic exercise without signs/symptoms of physical distress   Intensity THRR unchanged     Progression   Progression Continue to progress workloads to maintain intensity without signs/symptoms of physical distress.     Resistance Training   Training Prescription Yes   Weight blue bands'   Reps 10-12  10 minutes of strength training     Interval  Training   Interval Training No     NuStep   Level 5   Minutes 17   METs 1.9     Arm Ergometer   Level 6   Minutes 17     Goals Met:  Exercise tolerated well No report of cardiac concerns or symptoms Strength training completed today  Goals Unmet:  Not Applicable  Comments: Service time is from 10:30am to 12:40pm    Dr. Rush Farmer is Medical Director for Pulmonary Rehab at [facility].

## 2016-08-07 ENCOUNTER — Ambulatory Visit (INDEPENDENT_AMBULATORY_CARE_PROVIDER_SITE_OTHER): Payer: Commercial Managed Care - HMO | Admitting: Internal Medicine

## 2016-08-07 ENCOUNTER — Encounter: Payer: Self-pay | Admitting: Internal Medicine

## 2016-08-07 VITALS — BP 122/78 | HR 56 | Ht 69.0 in | Wt 241.0 lb

## 2016-08-07 DIAGNOSIS — J4540 Moderate persistent asthma, uncomplicated: Secondary | ICD-10-CM

## 2016-08-07 DIAGNOSIS — R0600 Dyspnea, unspecified: Secondary | ICD-10-CM | POA: Diagnosis not present

## 2016-08-07 DIAGNOSIS — R0689 Other abnormalities of breathing: Secondary | ICD-10-CM

## 2016-08-07 DIAGNOSIS — R768 Other specified abnormal immunological findings in serum: Secondary | ICD-10-CM

## 2016-08-07 DIAGNOSIS — R5381 Other malaise: Secondary | ICD-10-CM

## 2016-08-07 NOTE — Patient Instructions (Addendum)
ICD-9-CM ICD-10-CM   1. Dyspnea and respiratory abnormality 786.09 R06.00     R06.89   2. Morbid obesity, unspecified obesity type ([place]) 278.01 E66.01   3. Physical deconditioning 799.3 R53.81   4. Diastolic dysfunction A999333 I51.9   5. Moderate persistent asthma, uncomplicated 123456 [id number]   6. Ds DNA antibody positive 795.79 R76.8     - Your shortness of breath is because of all of the above reasons - Currently asthma appears in remission - glad rehab has helped a lot but agree we can see if we can make further improvement  Plan - My nurse will call Dr. Zenia Resides office today and make sure that we stop the lisinopril and he takes some other drug because the lisinopril can make asthma worse - Continue Advair for now without change - Continuer pulmonary rehabilitation -Refer Rheumatology (we are not sure if you saw one or not) - Cardiac optimization per Dr. Terrence Dupont  -Flu shot in fall  Follow-up - 3 months or sooner if needed

## 2016-08-07 NOTE — Progress Notes (Signed)
Subjective:     Patient ID: [name] [name], male   DOB: 1951-07-14, 65 y.o.   MRN: [id number]  HPI    6 never smoker seen for pulmonary consult during hospitalization 11/2015 for LLL HCAP and Pleuropericarditis   01/24/2016 [facility] follow up  Pt was admitted to hospital with chest pain. Went to cath labs. Had A Fib w/ RVR post procedure.  CT chest 12/20 showed moderate pericardial effusion . , small to mod left and small right pleural effusion and patchy bilateral lower lobe opacities. 2 D echo showed mild pericardial effusion with no evidence of tamponade. Autoimmune/Connective tissue disorder workup w/ repeat Rheumatoid factor neg.  Neg ANA . Scleroderma NEG . 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 Marland Kitchen  He was treated with IV abx .  He was felt to have a pleuropericarditis ? Viral related. He improved with stress dose steroids and colchicine .  He is feeling better since discharge.  Decreased cough and dyspnea.  Still has not regained energy level.  CXR today shows improvement with residual basilar pleural thickening ? Scarring noted. Effusions resolved.     OV 05/03/2016  Chief Complaint  Patient presents with  . Follow-up    PFT results. pt states breathing is baseline since last OV. c/o sob & wheezing. denies cough or cp/tightness.      Follow-up dyspnea in the setting of long history of asthma  65 year-old morbidly obese male. He tells me and I'm seeing him for the first time. That he has a lifelong history of asthma. For the last few to several years is progressive dyspnea on exertion. In October 2016 and perhaps in December 2016 he had repeat admissions for hospital-acquired pneumonia and pericardial effusion based on review of the chart and according to his history. He says he was diuresed. He denies having a pericardiocentesis. It appears the pericardial effusion was moderate in size on a CT chest but a follow-up echo a day later in December 2016 showed that it was only small and did not have  tamponade physiology. Since then he's better but he says he set a new low baseline with class 3 dyspnea on exertion relieved by rest. Is also associated with fatigue. He believes his asthma is under control but he is taking lisinopril added by his cardiologist.  Extensive autoimmune antibody panel October 2015 shows trace positive ANA and trace positive double-stranded DNA. Repeat in December 2016 continues to show trace positive double-stranded DNA but ANA was negative.  CT scan of the chest December 13 2015 shows moderate pericardial effusion and some mild bilateral pleural effusions and compressive atelectasis I personally visualized this. Echocardiogram 12/14/2015 that followed this showed only trace pericardial effusion without any tamponade physiology CXR Jan 2017  - clear per reprot    Pulmonary function test 03/29/2016 shows FEV1 1.87 L/72% FVC 2.42 L/72%. Both of post bronchodilator response. Ratio 77. TLC 4.4 L/69% - this is all consistent with restriction and his significant obesity. His DLCO is 23.8 and normal   Exhaled nitric oxide today is 35 ppb and in the gray zone. This is done while on Advair. He does have some wheezing on the right side in the base.    OV 08/07/2016  Chief Complaint  Patient presents with  . Follow-up    Pt c/o increased in cough, SOB and wheeze with exertion. Pt reports that when he is not active he does very well. Pt is still doing rehab 2 days a week.    Follow-up  moderate persistent asthma in the setting of morbid obesity, diastolic dysfunction, history of pericardial effusion with double-stranded DNA positivity, physical deconditioning and ACE inhibitor intake  Last seen May 2017. At that time referred to pulmonary rehabilitation. Since then dyspnea is improved from a scale of 7/10-> 4/10. He still feels he has residual dyspnea needs to improve further. He also tells me in the last 3 months he is on Advair and his asthma has been well controlled with  use of albuterol and no wheezing or shortness of breath or asthma exacerbation episodes. In fact on exam today is not wheezing. He does have a history of pericardial effusion and fluctuating ANA positivity and persistent double-stranded DNA positivity. We referred him to rheumatology but rather not sure if this referral went to. Patient himself is not sure. Also of note he is on ACE inhibitor. We recommended he talk to cardiologist Dr. Terrence Dupont and stop this but he still on it.    has a past medical history of Anemia; Asthma; Atrial fibrillation (HCC); CHF (congestive heart failure) ([place]); COPD (chronic obstructive pulmonary disease) ([place]); Heart disease; and Hypertension.   reports that he has never smoked. He has never used smokeless tobacco.  Past Surgical History:  Procedure Laterality Date  . CARDIAC CATHETERIZATION N/A 12/08/2015   Procedure: Left Heart Cath and Coronary Angiography;  Surgeon: Charolette Forward, MD;  Location: [facility];  Service: Cardiovascular;  Laterality: N/A;  . ESOPHAGUS SURGERY      No Known Allergies  Immunization History  Administered Date(s) Administered  . Influenza,inj,Quad PF,36+ Mos 12/09/2015    Family History  Problem Relation Age of Onset  . Cancer Mother      Current Outpatient Prescriptions:  .  ADVAIR DISKUS 250-50 MCG/DOSE AEPB, Inhale 1 puff into the lungs 2 (two) times daily., Disp: 60 each, Rfl: 3 .  albuterol (PROVENTIL HFA;VENTOLIN HFA) 108 (90 Base) MCG/ACT inhaler, Inhale 2 puffs into the lungs every 6 (six) hours as needed., Disp: 1 Inhaler, Rfl: 5 .  albuterol (PROVENTIL) (2.5 MG/3ML) 0.083% nebulizer solution, Take 3 mLs (2.5 mg total) by nebulization every 6 (six) hours as needed for wheezing or shortness of breath., Disp: 150 mL, Rfl: 1 .  amiodarone (PACERONE) 200 MG tablet, Take 1 tablet (200 mg total) by mouth 2 (two) times daily., Disp: 60 tablet, Rfl: 2 .  aspirin EC 81 MG tablet, Take 81 mg by mouth daily.  , Disp: ,  Rfl:  .  atorvastatin (LIPITOR) 40 MG tablet, Take 1 tablet (40 mg total) by mouth daily at 6 PM., Disp: 30 tablet, Rfl: 3 .  clomiPHENE (CLOMID) 50 MG tablet, 1/4 tab daily, Disp: 10 tablet, Rfl: 5 .  clotrimazole-betamethasone (LOTRISONE) cream, Apply 1 application topically 2 (two) times daily. (Patient taking differently: Apply 1 application topically 2 (two) times daily as needed (FOR RASH). ), Disp: 30 g, Rfl: 1 .  colchicine 0.6 MG tablet, Take 1 tablet (0.6 mg total) by mouth 2 (two) times daily., Disp: 60 tablet, Rfl: 3 .  digoxin (LANOXIN) 0.125 MG tablet, Take 1 tablet (0.125 mg total) by mouth daily., Disp: 30 tablet, Rfl: 3 .  ferrous sulfate 325 (65 FE) MG tablet, Take 325 mg by mouth daily with breakfast. Reported on 03/16/2016, Disp: , Rfl:  .  furosemide (LASIX) 20 MG tablet, TAKE 1 TABLET (20 MG TOTAL) BY MOUTH DAILY., Disp: 30 tablet, Rfl: 0 .  levETIRAcetam (KEPPRA) 750 MG tablet, Take 2 tablets (1,500 mg total) by mouth  2 (two) times daily., Disp: 120 tablet, Rfl: 11 .  lisinopril (PRINIVIL,ZESTRIL) 20 MG tablet, Take 20 mg by mouth daily. , Disp: , Rfl:  .  metoprolol succinate (TOPROL-XL) 50 MG 24 hr tablet, Take 50 mg by mouth daily. , Disp: , Rfl: 3 .  nitroGLYCERIN (NITROSTAT) 0.4 MG SL tablet, Place 1 tablet (0.4 mg total) under the tongue every 5 (five) minutes as needed for chest pain., Disp: 25 tablet, Rfl: 1 .  pantoprazole (PROTONIX) 40 MG tablet, Take 40 mg by mouth daily. , Disp: , Rfl:  .  tamoxifen (NOLVADEX) 10 MG tablet, Take 1 tablet (10 mg total) by mouth daily., Disp: 30 tablet, Rfl: 11 .  tamsulosin (FLOMAX) 0.4 MG CAPS capsule, Take 1 capsule (0.4 mg total) by mouth daily., Disp: 30 capsule, Rfl: 3   Review of Systems     Objective:   Physical Exam  Constitutional: He is oriented to person, place, and time. He appears well-developed and well-nourished. No distress.  HENT:  Head: Normocephalic and atraumatic.  Right Ear: External ear normal.  Left  Ear: External ear normal.  Mouth/Throat: Oropharynx is clear and moist. No oropharyngeal exudate.  Eyes: Conjunctivae and EOM are normal. Pupils are equal, round, and reactive to light. Right eye exhibits no discharge. Left eye exhibits no discharge. No scleral icterus.  Neck: Normal range of motion. Neck supple. No JVD present. No tracheal deviation present. No thyromegaly present.  Cardiovascular: Normal rate, regular rhythm and intact distal pulses.  Exam reveals no gallop and no friction rub.   No murmur heard. Pulmonary/Chest: Effort normal and breath sounds normal. No respiratory distress. He has no wheezes. He has no rales. He exhibits no tenderness.  No wheezing this time  Abdominal: Soft. Bowel sounds are normal. He exhibits no distension and no mass. There is no tenderness. There is no rebound and no guarding.  Musculoskeletal: Normal range of motion. He exhibits no edema or tenderness.  Lymphadenopathy:    He has no cervical adenopathy.  Neurological: He is alert and oriented to person, place, and time. He has normal reflexes. No cranial nerve deficit. Coordination normal.  Skin: Skin is warm and dry. No rash noted. He is not diaphoretic. No erythema. No pallor.  Psychiatric: He has a normal mood and affect. His behavior is normal. Judgment and thought content normal.  Nursing note and vitals reviewed.   Vitals:   08/07/16 1002  BP: 122/78  Pulse: (!) 56  SpO2: 99%  Weight: 241 lb (109.3 kg)  Height:  (1.753 m)   Body mass index is 35.59 kg/m.      Assessment:       ICD-9-CM ICD-10-CM   1. Dyspnea and respiratory abnormality 786.09 R06.00 Ambulatory referral to Rheumatology    R06.89   2. Ds DNA antibody positive 795.79 R76.8 Ambulatory referral to Rheumatology  3. Morbid obesity, unspecified obesity type (HCC) 278.01 E66.01   4. Physical deconditioning 799.3 R53.81   5. Moderate persistent asthma, uncomplicated 493.90 J45.40        Plan:     - Your  shortness of breath is because of all of the above reasons - Currently asthma appears in remission - glad rehab has helped a lot but agree we can see if we can make further improvement  Plan - My nurse will call Dr. Annitta Jersey office today and make sure that we stop the lisinopril and he takes some other drug because the lisinopril can make asthma worse - Continue  Advair for now without change - Continuer pulmonary rehabilitation -Refer Rheumatology (we are not sure if you saw one or not) - Cardiac optimization per Dr. Terrence Dupont  -Flu shot in fall  Follow-up - 3 months or sooner if needed  (> 50% of this 15 min visit spent in face to face counseling or/and coordination of care)   Dr. Brand Males, M.D., [facility].C.P Pulmonary and Critical Care Medicine Staff Physician [facility] Pager: [phone], If no answer or between  15:00h - 7:00h: call [phone]  08/07/2016 10:26 AM

## 2016-08-08 ENCOUNTER — Encounter (HOSPITAL_COMMUNITY)
Admission: RE | Admit: 2016-08-08 | Discharge: 2016-08-08 | Disposition: A | Discharge status: Home / Self Care | Payer: Commercial Managed Care - HMO | Source: Ambulatory Visit | Attending: Internal Medicine | Admitting: Internal Medicine

## 2016-08-08 DIAGNOSIS — R0689 Other abnormalities of breathing: Secondary | ICD-10-CM | POA: Diagnosis not present

## 2016-08-08 DIAGNOSIS — R0600 Dyspnea, unspecified: Secondary | ICD-10-CM | POA: Diagnosis not present

## 2016-08-08 DIAGNOSIS — J4540 Moderate persistent asthma, uncomplicated: Secondary | ICD-10-CM | POA: Diagnosis not present

## 2016-08-08 NOTE — Progress Notes (Signed)
Daily Session Note  Patient Details  Name: [name] [name] MRN: [id number] Date of Birth: January 03, 1951 Referring Provider:   April Manson Pulmonary Rehab Walk Test from 05/18/2016 in [place]  Referring Provider  Dr. Lavell Anchors      Encounter Date: 08/08/2016  Check In:     Session Check In - 08/08/16 1030      Check-In   Location [facility]   Staff Present Rosebud Poles, RN, BSN;Molly diVincenzo, MS, ACSM RCEP, Exercise Physiologist;Tyreek Clabo Ysidro Evert, Felipe Drone, RN, MHA;Portia Rollene Rotunda, RN, BSN   Supervising physician immediately available to respond to emergencies [facility] immediately available   Physician(s) Dr. Marily Memos   Medication changes reported     No   Fall or balance concerns reported    No   Warm-up and Cool-down Performed as group-led instruction   Resistance Training Performed Yes   VAD Patient? No     Pain Assessment   Currently in Pain? No/denies   Multiple Pain Sites No      Capillary Blood Glucose: No results found for this or any previous visit (from the past 24 hour(s)).      Exercise Prescription Changes - 08/08/16 1200      Response to Exercise   Blood Pressure (Admit) 136/80   Blood Pressure (Exercise) 136/70   Blood Pressure (Exit) 100/64   Heart Rate (Admit) 65 bpm   Heart Rate (Exercise) 88 bpm   Heart Rate (Exit) 76 bpm   Oxygen Saturation (Admit) 98 %   Oxygen Saturation (Exercise) 98 %   Oxygen Saturation (Exit) 98 %   Rating of Perceived Exertion (Exercise) 12   Perceived Dyspnea (Exercise) 2   Duration Progress to 45 minutes of aerobic exercise without signs/symptoms of physical distress   Intensity THRR unchanged     Progression   Progression Continue to progress workloads to maintain intensity without signs/symptoms of physical distress.     Resistance Training   Training Prescription Yes   Weight blue bands   Reps 10-12  10 minutes of strength training     Interval Training   Interval Training No     NuStep   Level 5   Minutes 17   METs 1.7     Arm Ergometer   Level 6   Minutes 17     Track   Laps 14   Minutes 17     Goals Met:  Exercise tolerated well No report of cardiac concerns or symptoms Strength training completed today  Goals Unmet:  Not Applicable  Comments: Service time is from 1030 to 1210     Dr. Rush Farmer is Medical Director for Pulmonary Rehab at [facility].

## 2016-08-08 NOTE — Progress Notes (Signed)
Pulmonary Individual Treatment Plan  Patient Details  Name: [name] [name] MRN: [id number] Date of Birth: [birth year] Referring Provider:   April Manson Pulmonary Rehab Walk Test from 05/18/2016 in [place]  Referring Provider  Dr. Lavell Anchors      Initial Encounter Date:  Flowsheet Row Pulmonary Rehab Walk Test from 05/18/2016 in [place]  Date  05/18/16  Referring Provider  Dr. Lavell Anchors      Visit Diagnosis: Dyspnea  Patient's Home Medications on Admission:   Current Outpatient Prescriptions:  .  ADVAIR DISKUS 250-50 MCG/DOSE AEPB, Inhale 1 puff into the lungs 2 (two) times daily., Disp: 60 each, Rfl: 3 .  albuterol (PROVENTIL HFA;VENTOLIN HFA) 108 (90 Base) MCG/ACT inhaler, Inhale 2 puffs into the lungs every 6 (six) hours as needed., Disp: 1 Inhaler, Rfl: 5 .  albuterol (PROVENTIL) (2.5 MG/3ML) 0.083% nebulizer solution, Take 3 mLs (2.5 mg total) by nebulization every 6 (six) hours as needed for wheezing or shortness of breath., Disp: 150 mL, Rfl: 1 .  amiodarone (PACERONE) 200 MG tablet, Take 1 tablet (200 mg total) by mouth 2 (two) times daily., Disp: 60 tablet, Rfl: 2 .  aspirin EC 81 MG tablet, Take 81 mg by mouth daily.  , Disp: , Rfl:  .  atorvastatin (LIPITOR) 40 MG tablet, Take 1 tablet (40 mg total) by mouth daily at 6 PM., Disp: 30 tablet, Rfl: 3 .  clomiPHENE (CLOMID) 50 MG tablet, 1/4 tab daily, Disp: 10 tablet, Rfl: 5 .  clotrimazole-betamethasone (LOTRISONE) cream, Apply 1 application topically 2 (two) times daily. (Patient taking differently: Apply 1 application topically 2 (two) times daily as needed (FOR RASH). ), Disp: 30 g, Rfl: 1 .  colchicine 0.6 MG tablet, Take 1 tablet (0.6 mg total) by mouth 2 (two) times daily., Disp: 60 tablet, Rfl: 3 .  digoxin (LANOXIN) 0.125 MG tablet, Take 1 tablet (0.125 mg total) by mouth daily., Disp: 30 tablet, Rfl: 3 .  ferrous sulfate 325 (65 FE) MG tablet, Take 325  mg by mouth daily with breakfast. Reported on 03/16/2016, Disp: , Rfl:  .  furosemide (LASIX) 20 MG tablet, TAKE 1 TABLET (20 MG TOTAL) BY MOUTH DAILY., Disp: 30 tablet, Rfl: 0 .  levETIRAcetam (KEPPRA) 750 MG tablet, Take 2 tablets (1,500 mg total) by mouth 2 (two) times daily., Disp: 120 tablet, Rfl: 11 .  lisinopril (PRINIVIL,ZESTRIL) 20 MG tablet, Take 20 mg by mouth daily. , Disp: , Rfl:  .  metoprolol succinate (TOPROL-XL) 50 MG 24 hr tablet, Take 50 mg by mouth daily. , Disp: , Rfl: 3 .  nitroGLYCERIN (NITROSTAT) 0.4 MG SL tablet, Place 1 tablet (0.4 mg total) under the tongue every 5 (five) minutes as needed for chest pain., Disp: 25 tablet, Rfl: 1 .  pantoprazole (PROTONIX) 40 MG tablet, Take 40 mg by mouth daily. , Disp: , Rfl:  .  tamoxifen (NOLVADEX) 10 MG tablet, Take 1 tablet (10 mg total) by mouth daily., Disp: 30 tablet, Rfl: 11 .  tamsulosin (FLOMAX) 0.4 MG CAPS capsule, Take 1 capsule (0.4 mg total) by mouth daily., Disp: 30 capsule, Rfl: 3  Past Medical History: Past Medical History:  Diagnosis Date  . Anemia   . Asthma   . Atrial fibrillation ([place])   . CHF (congestive heart failure) ([place])   . COPD (chronic obstructive pulmonary disease) ([place])   . Heart disease   . Hypertension     Tobacco Use: History  Smoking  Status  . Never Smoker  Smokeless Tobacco  . Never Used    Labs: Recent Review Flowsheet Data    Labs for ITP Cardiac and Pulmonary Rehab Latest Ref Rng & Units 01/25/2014 08/24/2014 11/27/2015 12/08/2015 12/09/2015   Cholestrol 0 - 200 mg/dL 145 138 - - 144   LDLCALC 0 - 99 mg/dL 83 77 - - 76   HDL >40 mg/dL 39(L) 38(L) - - 57   Trlycerides <150 mg/dL 116 113 - - 53   Hemoglobin A1c 4.8 - 5.6 % - - - 5.8(H) -   PHART 7.350 - 7.450 - - 7.394 - -   PCO2ART 35.0 - 45.0 mmHg - - 39.0 - -   HCO3 20.0 - 24.0 mEq/L - - 23.8 - -   TCO2 0 - 100 mmol/L - - 25 23 -   ACIDBASEDEF 0.0 - 2.0 mmol/L - - 1.0 - -   O2SAT % - - 99.0 - -      Capillary Blood  Glucose: Lab Results  Component Value Date   GLUCAP 71 01/23/2016   GLUCAP 78 01/24/2014     ADL UCSD:     Pulmonary Assessment Scores    Row Name 06/20/16 1412         ADL UCSD   ADL Phase Entry     SOB Score total 85        Pulmonary Function Assessment:     Pulmonary Function Assessment - 05/08/16 1037      Breath   Bilateral Breath Sounds Clear   Shortness of Breath Fear of Shortness of Breath;Limiting activity      Exercise Target Goals:    Exercise Program Goal: Individual exercise prescription set with THRR, safety & activity barriers. Participant demonstrates ability to understand and report RPE using BORG scale, to self-measure pulse accurately, and to acknowledge the importance of the exercise prescription.  Exercise Prescription Goal: Starting with aerobic activity 30 plus minutes a day, 3 days per week for initial exercise prescription. Provide home exercise prescription and guidelines that participant acknowledges understanding prior to discharge.  Activity Barriers & Risk Stratification:     Activity Barriers & Cardiac Risk Stratification - 05/08/16 1036      Activity Barriers & Cardiac Risk Stratification   Activity Barriers --  none      6 Minute Walk:     6 Minute Walk    Row Name 05/19/16 0659         6 Minute Walk   Phase Initial     Distance 1350 feet     Walk Time 6 minutes     MPH 2.55     RPE 12     VO2 Peak 9.7     Symptoms No     Resting HR 73 bpm     Resting BP 110/80     Max Ex. HR 107 bpm     Max Ex. BP 158/60       Interval HR   Baseline HR 73     1 Minute HR 96     2 Minute HR 102     3 Minute HR 105     4 Minute HR 104     5 Minute HR 107     6 Minute HR 106     2 Minute Post HR 91       Interval Oxygen   Interval Oxygen? Yes     Baseline Oxygen Saturation % 96 %  Baseline Liters of Oxygen 0 L     1 Minute Oxygen Saturation % 96 %     1 Minute Liters of Oxygen 0 L     2 Minute Oxygen Saturation  % 96 %     2 Minute Liters of Oxygen 0 L     3 Minute Oxygen Saturation % 99 %     3 Minute Liters of Oxygen 0 L     4 Minute Oxygen Saturation % 98 %     4 Minute Liters of Oxygen 0 L     5 Minute Oxygen Saturation % 98 %     5 Minute Liters of Oxygen 0 L     6 Minute Oxygen Saturation % 98 %     6 Minute Liters of Oxygen 0 L     2 Minute Post Oxygen Saturation % 98 %     2 Minute Post Liters of Oxygen 0 L        Initial Exercise Prescription:     Initial Exercise Prescription - 05/19/16 0700      Date of Initial Exercise RX and Referring Provider   Date 05/18/16   Referring Provider Dr. Lavell Anchors     NuStep   Level 3   Minutes 15   METs 1.7     Arm Ergometer   Level 3   Watts 30   Minutes 15     Track   Laps 7   Minutes 15     Prescription Details   Frequency (times per week) 2   Duration Progress to 45 minutes of aerobic exercise without signs/symptoms of physical distress     Intensity   THRR 40-80% of Max Heartrate 61-122   Ratings of Perceived Exertion 11-13   Perceived Dyspnea 0-4     Progression   Progression Continue progressive overload as per policy without signs/symptoms or physical distress.     Resistance Training   Training Prescription Yes   Weight BLUE BANDS   Reps 10-12      Perform Capillary Blood Glucose checks as needed.  Exercise Prescription Changes:     Exercise Prescription Changes    Row Name 05/25/16 1300 05/30/16 1200 06/01/16 1200 06/06/16 1200 06/08/16 1200     Exercise Review   Progression  -  -  - Yes  -     Response to Exercise   Blood Pressure (Admit) 124/80 124/62 102/64 136/76 120/86   Blood Pressure (Exercise) 128/62 134/76 126/84 150/76 126/74   Blood Pressure (Exit) 126/80 108/76 118/80 110/72 122/80   Heart Rate (Admit) 61 bpm 69 bpm 62 bpm 64 bpm 58 bpm   Heart Rate (Exercise) 68 bpm 73 bpm 66 bpm 90 bpm 76 bpm   Heart Rate (Exit) 59 bpm 63 bpm 64 bpm 65 bpm 63 bpm   Oxygen Saturation (Admit) 100 % 96 %  98 % 99 % 97 %   Oxygen Saturation (Exercise) 98 % 96 % 97 % 96 % 97 %   Oxygen Saturation (Exit) 98 % 96 % 99 % 95 % 99 %   Rating of Perceived Exertion (Exercise) 11 11 11 11 11    Perceived Dyspnea (Exercise) 1 0 1 1 1    Duration Progress to 45 minutes of aerobic exercise without signs/symptoms of physical distress Progress to 45 minutes of aerobic exercise without signs/symptoms of physical distress Progress to 45 minutes of aerobic exercise without signs/symptoms of physical distress Progress to 45 minutes of aerobic exercise without signs/symptoms  of physical distress Progress to 45 minutes of aerobic exercise without signs/symptoms of physical distress   Intensity Rest + 40 Rest + 40 THRR unchanged THRR unchanged THRR unchanged     Progression   Progression Continue to progress workloads to maintain intensity without signs/symptoms of physical distress. Continue to progress workloads to maintain intensity without signs/symptoms of physical distress. Continue to progress workloads to maintain intensity without signs/symptoms of physical distress. Continue to progress workloads to maintain intensity without signs/symptoms of physical distress. Continue to progress workloads to maintain intensity without signs/symptoms of physical distress.     Resistance Training   Training Prescription Yes Yes Yes Yes Yes   Weight BLUE BANDS BLUE BANDS BLUE BANDS BLUE BANDS BLUE BANDS   Reps 10-12 10-12 10-12 10-12 10-12     Interval Training   Interval Training  -  - No No No     NuStep   Level  - 3 3 4 4    Minutes  - 15 15 15 15    METs  - 1.6 1.8 1.7 1.7     Arm Ergometer   Level 1 3  - 3 3   Watts 30 30  -  -  -   Minutes 15 15  - 15 15     Track   Laps 10 10 11 10   -   Minutes 15 15 15 15   -   Row Name 06/13/16 1200 06/15/16 1200 06/20/16 1219 06/22/16 1200 06/29/16 1200     Exercise Review   Progression Yes Yes  -  - Yes     Response to Exercise   Blood Pressure (Admit) 104/60 104/70  134/84 120/94 110/64   Blood Pressure (Exercise) 124/82 120/82 134/80 140/80  -   Blood Pressure (Exit) 118/70 104/60 126/78 114/80 110/70   Heart Rate (Admit) 64 bpm 61 bpm 59 bpm 59 bpm 58 bpm   Heart Rate (Exercise) 77 bpm 76 bpm 71 bpm 69 bpm 71 bpm   Heart Rate (Exit) 65 bpm 70 bpm 64 bpm 60 bpm 60 bpm   Oxygen Saturation (Admit) 94 % 97 % 97 % 98 % 99 %   Oxygen Saturation (Exercise) 99 % 99 % 100 % 100 % 98 %   Oxygen Saturation (Exit) 100 % 97 % 99 % 100 % 100 %   Rating of Perceived Exertion (Exercise) 13 11 13 13 12    Perceived Dyspnea (Exercise) 2 0 1 2 1    Duration Progress to 45 minutes of aerobic exercise without signs/symptoms of physical distress Progress to 45 minutes of aerobic exercise without signs/symptoms of physical distress Progress to 45 minutes of aerobic exercise without signs/symptoms of physical distress Progress to 45 minutes of aerobic exercise without signs/symptoms of physical distress Progress to 45 minutes of aerobic exercise without signs/symptoms of physical distress   Intensity THRR unchanged THRR unchanged THRR unchanged THRR unchanged THRR unchanged     Progression   Progression Continue to progress workloads to maintain intensity without signs/symptoms of physical distress. Continue to progress workloads to maintain intensity without signs/symptoms of physical distress. Continue to progress workloads to maintain intensity without signs/symptoms of physical distress. Continue to progress workloads to maintain intensity without signs/symptoms of physical distress. Continue to progress workloads to maintain intensity without signs/symptoms of physical distress.     Resistance Training   Training Prescription Yes Yes Yes Yes Yes   Weight blue bands' blue bands' blue bands' blue bands' blue bands'   Reps 10-12 10-12 10-12  10-12 10-12     Interval Training   Interval Training No No No No No     NuStep   Level 5  - 5 4 5    Minutes 15  - 17 17 17    METs  2.2  - 1.9 1.9 1.8     Arm Ergometer   Level 2  Decreased work load due to tightness and DOE resolved 3  Decreased work load due to tightness and DOE resolved 3  -  -   Watts  -  - 17  -  -   Minutes 15 15 15   -  -     Track   Laps  - 10 15 9 11    Minutes 15 15 17 17 17      Home Exercise Plan   Plans to continue exercise at  -  - Home  -  -   Frequency  -  - Add 3 additional days to program exercise sessions.  -  -   Row Name 07/04/16 1239 07/11/16 1200 07/13/16 1300 07/18/16 1200 07/20/16 1200     Exercise Review   Progression Yes  -  -  -  -     Response to Exercise   Blood Pressure (Admit) 120/82 108/64 158/70 140/80 110/60   Blood Pressure (Exercise) 126/70 130/70 130/66 124/70 122/66   Blood Pressure (Exit) 122/74 122/62 108/60 102/68 110/76   Heart Rate (Admit) 56 bpm 69 bpm 66 bpm 68 bpm 64 bpm   Heart Rate (Exercise) 74 bpm 80 bpm 74 bpm 80 bpm 65 bpm   Heart Rate (Exit) 61 bpm 63 bpm 72 bpm 64 bpm 64 bpm   Oxygen Saturation (Admit) 98 % 98 % 98 % 98 % 98 %   Oxygen Saturation (Exercise) 100 % 100 % 95 % 95 % 99 %   Oxygen Saturation (Exit) 98 % 99 % 97 % 97 % 93 %   Rating of Perceived Exertion (Exercise) 12 13 13 13 13    Perceived Dyspnea (Exercise) 1 2 2 3 2    Duration Progress to 45 minutes of aerobic exercise without signs/symptoms of physical distress Progress to 45 minutes of aerobic exercise without signs/symptoms of physical distress Progress to 45 minutes of aerobic exercise without signs/symptoms of physical distress Progress to 45 minutes of aerobic exercise without signs/symptoms of physical distress Progress to 45 minutes of aerobic exercise without signs/symptoms of physical distress   Intensity THRR unchanged THRR unchanged THRR unchanged THRR unchanged THRR unchanged     Progression   Progression Continue to progress workloads to maintain intensity without signs/symptoms of physical distress. Continue to progress workloads to maintain intensity without  signs/symptoms of physical distress. Continue to progress workloads to maintain intensity without signs/symptoms of physical distress. Continue to progress workloads to maintain intensity without signs/symptoms of physical distress. Continue to progress workloads to maintain intensity without signs/symptoms of physical distress.     Resistance Training   Training Prescription Yes Yes Yes Yes Yes   Weight blue bands' blue bands' blue bands' blue bands' blue bands'   Reps 10-12 10-12  10 minutes of strength training 10-12  10 minutes of strength training 10-12  10 minutes of strength training 10-12  10 minutes of strength training     Interval Training   Interval Training No No No No No     NuStep   Level 5 5 5 5 5    Minutes 17 17 17 17 17    METs 2 2 2  1.7 1.7     Arm Ergometer   Level 3 3 3 3   -   Minutes 17 17 17 17   -     Track   Laps 10 10  - 12 7   Minutes 17 17  - 51 17   Row Name 07/25/16 1200 07/27/16 1200 08/01/16 1200 08/03/16 1200       Exercise Review   Progression Yes Yes  - Yes      Response to Exercise   Blood Pressure (Admit) 120/62 120/60 148/70 108/74    Blood Pressure (Exercise) 122/70 148/70 100/60 126/70    Blood Pressure (Exit) 112/66 100/66 140/60 120/80    Heart Rate (Admit) 68 bpm 88 bpm 77 bpm 61 bpm    Heart Rate (Exercise) 83 bpm 80 bpm 92 bpm 96 bpm    Heart Rate (Exit) 64 bpm 69 bpm 76 bpm 73 bpm    Oxygen Saturation (Admit) 98 % 95 % 100 % 98 %    Oxygen Saturation (Exercise) 96 % 99 % 99 % 98 %    Oxygen Saturation (Exit) 97 % 97 % 96 % 99 %    Rating of Perceived Exertion (Exercise) 14 12 12 12     Perceived Dyspnea (Exercise) 2 1 2 2     Duration Progress to 45 minutes of aerobic exercise without signs/symptoms of physical distress Progress to 45 minutes of aerobic exercise without signs/symptoms of physical distress Progress to 45 minutes of aerobic exercise without signs/symptoms of physical distress Progress to 45 minutes of aerobic  exercise without signs/symptoms of physical distress    Intensity THRR unchanged THRR unchanged THRR unchanged THRR unchanged      Progression   Progression Continue to progress workloads to maintain intensity without signs/symptoms of physical distress. Continue to progress workloads to maintain intensity without signs/symptoms of physical distress. Continue to progress workloads to maintain intensity without signs/symptoms of physical distress. Continue to progress workloads to maintain intensity without signs/symptoms of physical distress.      Resistance Training   Training Prescription Yes Yes Yes Yes    Weight blue bands' blue bands' blue bands' blue bands'    Reps 10-12  10 minutes of strength training 10-12  10 minutes of strength training 10-12  10 minutes of strength training 10-12  10 minutes of strength training      Interval Training   Interval Training No No No No      NuStep   Level 5  - 5 5    Minutes 17  - 17 17    METs 1.5  - 1.9 1.9      Arm Ergometer   Level 4 5 5 6     Minutes 17 17 17 17       Track   Laps 9 14 12   -    Minutes 17 17 17   -       Exercise Comments:     Exercise Comments    Row Name 06/08/16 0746 06/20/16 1302 07/06/16 1615 08/08/16 0746     Exercise Comments Patient has attended three exercise sessions. Has had slight increases. Will cont. to monitor exercise progression.  Spoke with patient in regards to Home Exercise Prescription today. Patient is going to exercise at home walking in neighborhood 2-3 days a week for 30 minutes.  Patient continues to tolerate workload increases on exercise equiptment. Patient is tolerating workload changes well. I see a difference in his motivation to make changes. Will cont. to monitor.  Discharge Exercise Prescription (Final Exercise Prescription Changes):     Exercise Prescription Changes - 08/03/16 1200      Exercise Review   Progression Yes     Response to Exercise   Blood Pressure  (Admit) 108/74   Blood Pressure (Exercise) 126/70   Blood Pressure (Exit) 120/80   Heart Rate (Admit) 61 bpm   Heart Rate (Exercise) 96 bpm   Heart Rate (Exit) 73 bpm   Oxygen Saturation (Admit) 98 %   Oxygen Saturation (Exercise) 98 %   Oxygen Saturation (Exit) 99 %   Rating of Perceived Exertion (Exercise) 12   Perceived Dyspnea (Exercise) 2   Duration Progress to 45 minutes of aerobic exercise without signs/symptoms of physical distress   Intensity THRR unchanged     Progression   Progression Continue to progress workloads to maintain intensity without signs/symptoms of physical distress.     Resistance Training   Training Prescription Yes   Weight blue bands'   Reps 10-12  10 minutes of strength training     Interval Training   Interval Training No     NuStep   Level 5   Minutes 17   METs 1.9     Arm Ergometer   Level 6   Minutes 17       Nutrition:  Target Goals: Understanding of nutrition guidelines, daily intake of sodium 1500mg , cholesterol 200mg , calories 30% from fat and 7% or less from saturated fats, daily to have 5 or more servings of fruits and vegetables.  Biometrics:    Nutrition Therapy Plan and Nutrition Goals:     Nutrition Therapy & Goals - 06/08/16 1301      Nutrition Therapy   Diet Therapeutic Lifestyle Changes     Personal Nutrition Goals   Personal Goal #1 0.5-2 lb wt loss per week to a goal wt loss of 6-24 lb at graduation from [place], educate and counsel regarding individualized specific dietary modifications aiming towards targeted core components such as weight, hypertension, lipid management, diabetes, heart failure and other comorbidities.   Expected Outcomes Short Term Goal: Understand basic principles of dietary content, such as calories, fat, sodium, cholesterol and nutrients.;Long Term Goal: Adherence to prescribed nutrition plan.      Nutrition Discharge: Rate  Your Plate Scores:     Nutrition Assessments - 06/08/16 1253      Rate Your Plate Scores   Pre Score 37      Psychosocial: Target Goals: Acknowledge presence or absence of depression, maximize coping skills, provide positive support system. Participant is able to verbalize types and ability to use techniques and skills needed for reducing stress and depression.  Initial Review & Psychosocial Screening:     Initial Psych Review & Screening - 05/08/16 [place]? No   Concerns No support system  family is in other states, does not have many close friends     Barriers   Psychosocial barriers to participate in program There are no identifiable barriers or psychosocial needs.     Screening Interventions   Interventions Encouraged to exercise      Quality of Life Scores:     Quality of Life - 06/20/16 1411      Quality of Life Scores   Health/Function Pre 17.17 %   Socioeconomic Pre 17.33 %   Psych/Spiritual Pre 18.57 %   Family Pre 16.38 %  GLOBAL Pre 17.4 %      PHQ-9: Recent Review Flowsheet Data    Depression screen [facility] 2/9 07/20/2016 05/24/2016 05/08/2016 04/27/2016 03/23/2016   Decreased Interest 0 0 0 0 0   Down, Depressed, Hopeless 0 0 0 0 0   PHQ - 2 Score 0 0 0 0 0      Psychosocial Evaluation and Intervention:     Psychosocial Evaluation - 05/08/16 1055      Psychosocial Evaluation & Interventions   Interventions Encouraged to exercise with the program and follow exercise prescription   Continued Psychosocial Services Needed No      Psychosocial Re-Evaluation:     Psychosocial Re-Evaluation    Row Name 06/08/16 1149 07/04/16 0823 08/07/16 0918         Psychosocial Re-Evaluation   Interventions Encouraged to attend Pulmonary Rehabilitation for the exercise Encouraged to attend Pulmonary Rehabilitation for the exercise Encouraged to attend Pulmonary Rehabilitation for the exercise     Comments patient  continues to have flat affect. RN continues to offer support and referral to spiritual care however pt states he is ok. patient continues to have flat affect. RN continues to offer support and referral to spiritual care however pt states he is ok. patient continues to have flat affect. RN continues to offer support and referral to spiritual care however pt states he is ok and does not need intervention     Continued Psychosocial Services Needed Yes  -  -       Education: Education Goals: Education classes will be provided on a weekly basis, covering required topics. Participant will state understanding/return demonstration of topics presented.  Learning Barriers/Preferences:     Learning Barriers/Preferences - 05/08/16 1036      Learning Barriers/Preferences   Learning Barriers None   Learning Preferences Written Material;Video;Verbal Instruction;Skilled Demonstration;Pictoral;Individual Instruction;Group Instruction;Computer/Internet      Education Topics: Risk Factor Reduction:  -Group instruction that is supported by a PowerPoint presentation. Instructor discusses the definition of a risk factor, different risk factors for pulmonary disease, and how the heart and lungs work together.     Nutrition for Pulmonary Patient:  -Group instruction provided by PowerPoint slides, verbal discussion, and written materials to support subject matter. The instructor gives an explanation and review of healthy diet recommendations, which includes a discussion on weight management, recommendations for fruit and vegetable consumption, as well as protein, fluid, caffeine, fiber, sodium, sugar, and alcohol. Tips for eating when patients are short of breath are discussed. Flowsheet Row PULMONARY REHAB OTHER RESPIRATORY from 08/03/2016 in [place]  Date  08/03/16  Educator  RD  Instruction Review Code  2- meets goals/outcomes      Pursed Lip Breathing:  -Group  instruction that is supported by demonstration and informational handouts. Instructor discusses the benefits of pursed lip and diaphragmatic breathing and detailed demonstration on how to preform both.   Flowsheet Row PULMONARY REHAB OTHER RESPIRATORY from 08/03/2016 in [place]  Date  07/20/16  Educator  EP      Oxygen Safety:  -Group instruction provided by PowerPoint, verbal discussion, and written material to support subject matter. There is an overview of "What is Oxygen" and "Why do we need it".  Instructor also reviews how to create a safe environment for oxygen use, the importance of using oxygen as prescribed, and the risks of noncompliance. There is a brief discussion on traveling with oxygen and resources the patient may utilize. Flowsheet  Row PULMONARY REHAB OTHER RESPIRATORY from 08/03/2016 in [facility]  Date  06/08/16  Educator  rn  Instruction Review Code  2- meets goals/outcomes      Oxygen Equipment:  -Group instruction provided by [employer] Staff utilizing handouts, written materials, and equipment demonstrations. Flowsheet Row PULMONARY REHAB OTHER RESPIRATORY from 08/03/2016 in [place]  Date  06/22/16  Educator  [employer]  Instruction Review Code  2- meets goals/outcomes      Signs and Symptoms:  -Group instruction provided by written material and verbal discussion to support subject matter. Warning signs and symptoms of infection, stroke, and heart attack are reviewed and when to call the physician/911 reinforced. Tips for preventing the spread of infection discussed.   Advanced Directives:  -Group instruction provided by verbal instruction and written material to support subject matter. Instructor reviews Advanced Directive laws and proper instruction for filling out document.   Pulmonary Video:  -Group video education that reviews the importance of medication  and oxygen compliance, exercise, good nutrition, pulmonary hygiene, and pursed lip and diaphragmatic breathing for the pulmonary patient. Flowsheet Row PULMONARY REHAB OTHER RESPIRATORY from 08/03/2016 in [place]  Date  06/01/16  Instruction Review Code  2- meets goals/outcomes      Exercise for the Pulmonary Patient:  -Group instruction that is supported by a PowerPoint presentation. Instructor discusses benefits of exercise, core components of exercise, frequency, duration, and intensity of an exercise routine, importance of utilizing pulse oximetry during exercise, safety while exercising, and options of places to exercise outside of rehab.   Flowsheet Row PULMONARY REHAB OTHER RESPIRATORY from 08/03/2016 in [place]  Date  06/29/16  Educator  EP  Instruction Review Code  2- meets goals/outcomes      Pulmonary Medications:  -Verbally interactive group education provided by instructor with focus on inhaled medications and proper administration.   Anatomy and Physiology of the Respiratory System and Intimacy:  -Group instruction provided by PowerPoint, verbal discussion, and written material to support subject matter. Instructor reviews respiratory cycle and anatomical components of the respiratory system and their functions. Instructor also reviews differences in obstructive and restrictive respiratory diseases with examples of each. Intimacy, Sex, and Sexuality differences are reviewed with a discussion on how relationships can change when diagnosed with pulmonary disease. Common sexual concerns are reviewed. Flowsheet Row PULMONARY REHAB OTHER RESPIRATORY from 08/03/2016 in [place]  Date  07/13/16  Educator  RN  Instruction Review Code  2- meets goals/outcomes      Knowledge Questionnaire Score:     Knowledge Questionnaire Score - 06/20/16 1411      Knowledge Questionnaire Score    Pre Score 9/13      Core Components/Risk Factors/Patient Goals at Admission:     Personal Goals and Risk Factors at Admission - 05/08/16 1041      Core Components/Risk Factors/Patient Goals on Admission   Increase Strength and Stamina Yes   Intervention Provide advice, education, support and counseling about physical activity/exercise needs.;Develop an individualized exercise prescription for aerobic and resistive training based on initial evaluation findings, risk stratification, comorbidities and participant's personal goals.   Expected Outcomes Achievement of increased cardiorespiratory fitness and enhanced flexibility, muscular endurance and strength shown through measurements of functional capacity and personal statement of participant.   Improve shortness of breath with ADL's Yes   Intervention Provide education, individualized exercise plan and daily  activity instruction to help decrease symptoms of SOB with activities of daily living.   Expected Outcomes Short Term: Achieves a reduction of symptoms when performing activities of daily living.   Develop more efficient breathing techniques such as purse lipped breathing and diaphragmatic breathing; and practicing self-pacing with activity Yes   Intervention Provide education, demonstration and support about specific breathing techniuqes utilized for more efficient breathing. Include techniques such as pursed lipped breathing, diaphragmatic breathing and self-pacing activity.   Expected Outcomes Short Term: Participant will be able to demonstrate and use breathing techniques as needed throughout daily activities.      Core Components/Risk Factors/Patient Goals Review:      Goals and Risk Factor Review    Row Name 05/08/16 1049 06/08/16 1148 07/04/16 0822 08/07/16 0918       Core Components/Risk Factors/Patient Goals Review   Personal Goals Review Increase Strength and Stamina;Improve shortness of breath with ADL's;Develop more  efficient breathing techniques such as purse lipped breathing and diaphragmatic breathing and practicing self-pacing with activity. Increase Strength and Stamina;Improve shortness of breath with ADL's;Develop more efficient breathing techniques such as purse lipped breathing and diaphragmatic breathing and practicing self-pacing with activity. Increase Strength and Stamina;Improve shortness of breath with ADL's;Develop more efficient breathing techniques such as purse lipped breathing and diaphragmatic breathing and practicing self-pacing with activity. Increase Strength and Stamina;Improve shortness of breath with ADL's;Develop more efficient breathing techniques such as purse lipped breathing and diaphragmatic breathing and practicing self-pacing with activity.    Review  - patient is slowly making progress toward personal goals patient is slowly making progress toward personal goals patient is slowly making progress toward personal goals    Expected Outcomes To increase strength, stamina and improve shortness of breath with efficient breathing techniques with education and increasing workloads on equipment. see outcomes on Admission ITP see outcomes on Admission ITP see outcomes on Admission ITP       Core Components/Risk Factors/Patient Goals at Discharge (Final Review):      Goals and Risk Factor Review - 08/07/16 0918      Core Components/Risk Factors/Patient Goals Review   Personal Goals Review Increase Strength and Stamina;Improve shortness of breath with ADL's;Develop more efficient breathing techniques such as purse lipped breathing and diaphragmatic breathing and practicing self-pacing with activity.   Review patient is slowly making progress toward personal goals   Expected Outcomes see outcomes on Admission ITP      ITP Comments:   Comments: ITP REVIEW Pt is making slow progress toward personal goals after completing 19 sessions. Barriers noted in ITP. Recommend continued exercise,  life style modification, education, and utilization of breathing techniques to increase stamina and strength and decrease shortness of breath with exertion.

## 2016-08-09 DIAGNOSIS — D649 Anemia, unspecified: Secondary | ICD-10-CM | POA: Diagnosis not present

## 2016-08-09 DIAGNOSIS — J449 Chronic obstructive pulmonary disease, unspecified: Secondary | ICD-10-CM | POA: Diagnosis not present

## 2016-08-09 DIAGNOSIS — I5040 Unspecified combined systolic (congestive) and diastolic (congestive) heart failure: Secondary | ICD-10-CM | POA: Diagnosis not present

## 2016-08-09 DIAGNOSIS — J45909 Unspecified asthma, uncomplicated: Secondary | ICD-10-CM | POA: Diagnosis not present

## 2016-08-09 DIAGNOSIS — G4733 Obstructive sleep apnea (adult) (pediatric): Secondary | ICD-10-CM | POA: Diagnosis not present

## 2016-08-09 DIAGNOSIS — E785 Hyperlipidemia, unspecified: Secondary | ICD-10-CM | POA: Diagnosis not present

## 2016-08-09 DIAGNOSIS — I4891 Unspecified atrial fibrillation: Secondary | ICD-10-CM | POA: Diagnosis not present

## 2016-08-09 DIAGNOSIS — K219 Gastro-esophageal reflux disease without esophagitis: Secondary | ICD-10-CM | POA: Diagnosis not present

## 2016-08-09 DIAGNOSIS — M1990 Unspecified osteoarthritis, unspecified site: Secondary | ICD-10-CM | POA: Diagnosis not present

## 2016-08-09 DIAGNOSIS — I10 Essential (primary) hypertension: Secondary | ICD-10-CM | POA: Diagnosis not present

## 2016-08-09 DIAGNOSIS — E669 Obesity, unspecified: Secondary | ICD-10-CM | POA: Diagnosis not present

## 2016-08-09 DIAGNOSIS — I2510 Atherosclerotic heart disease of native coronary artery without angina pectoris: Secondary | ICD-10-CM | POA: Diagnosis not present

## 2016-08-10 ENCOUNTER — Encounter (HOSPITAL_COMMUNITY)
Admission: RE | Admit: 2016-08-10 | Discharge: 2016-08-10 | Disposition: A | Discharge status: Home / Self Care | Payer: Commercial Managed Care - HMO | Source: Ambulatory Visit | Attending: Internal Medicine | Admitting: Internal Medicine

## 2016-08-10 VITALS — Wt 245.4 lb

## 2016-08-10 DIAGNOSIS — R0600 Dyspnea, unspecified: Secondary | ICD-10-CM | POA: Diagnosis not present

## 2016-08-10 DIAGNOSIS — R0689 Other abnormalities of breathing: Secondary | ICD-10-CM | POA: Diagnosis not present

## 2016-08-10 DIAGNOSIS — J4540 Moderate persistent asthma, uncomplicated: Secondary | ICD-10-CM | POA: Diagnosis not present

## 2016-08-10 NOTE — Progress Notes (Signed)
Daily Session Note  Patient Details  Name: [name] [name] MRN: [id number] Date of Birth: [birth year] Referring Provider:   April Manson Pulmonary Rehab Walk Test from 05/18/2016 in [place]  Referring Provider  Dr. Lavell Anchors      Encounter Date: 08/10/2016  Check In:     Session Check In - 08/10/16 1135      Check-In   Location [facility]   Staff Present Rosebud Poles, RN, BSN;Jelesa Mangini, MS, ACSM RCEP, Exercise Physiologist;Lisa Ysidro Evert, Felipe Drone, RN, MHA;Portia Rollene Rotunda, RN, BSN   Supervising physician immediately available to respond to emergencies [facility] immediately available   Physician(s) Dr. Marthenia Rolling   Medication changes reported     No   Fall or balance concerns reported    No   Warm-up and Cool-down Performed as group-led instruction   Resistance Training Performed Yes   VAD Patient? No     Pain Assessment   Currently in Pain? No/denies   Multiple Pain Sites No      Capillary Blood Glucose: No results found for this or any previous visit (from the past 24 hour(s)).      Exercise Prescription Changes - 08/10/16 1200      Response to Exercise   Blood Pressure (Admit) 146/80   Blood Pressure (Exercise) 120/66   Blood Pressure (Exit) 128/78   Heart Rate (Admit) 78 bpm   Heart Rate (Exercise) 94 bpm   Heart Rate (Exit) 68 bpm   Oxygen Saturation (Admit) 98 %   Oxygen Saturation (Exercise) 94 %   Oxygen Saturation (Exit) 96 %   Rating of Perceived Exertion (Exercise) 12   Perceived Dyspnea (Exercise) 2   Duration Progress to 45 minutes of aerobic exercise without signs/symptoms of physical distress   Intensity THRR unchanged     Progression   Progression Continue to progress workloads to maintain intensity without signs/symptoms of physical distress.     Resistance Training   Training Prescription Yes   Weight blue bands   Reps 10-12  10 minutes of strength training     Interval  Training   Interval Training No     Arm Ergometer   Level 6   Minutes 17     Track   Laps 10   Minutes 17     Goals Met:  Exercise tolerated well No report of cardiac concerns or symptoms Strength training completed today  Goals Unmet:  Not Applicable  Comments: Service time is from 10:30am to 01:15pm. Patient attended class with Dr. Nelda Marseille.    Dr. Rush Farmer is Medical Director for Pulmonary Rehab at [facility].

## 2016-08-15 ENCOUNTER — Encounter (HOSPITAL_COMMUNITY): Payer: Commercial Managed Care - HMO

## 2016-08-17 ENCOUNTER — Other Ambulatory Visit: Payer: Self-pay

## 2016-08-17 ENCOUNTER — Encounter (HOSPITAL_COMMUNITY)
Admission: RE | Admit: 2016-08-17 | Discharge: 2016-08-17 | Disposition: A | Discharge status: Home / Self Care | Payer: Commercial Managed Care - HMO | Source: Ambulatory Visit | Attending: Internal Medicine | Admitting: Internal Medicine

## 2016-08-17 DIAGNOSIS — R0689 Other abnormalities of breathing: Secondary | ICD-10-CM | POA: Diagnosis not present

## 2016-08-17 DIAGNOSIS — I4891 Unspecified atrial fibrillation: Secondary | ICD-10-CM | POA: Diagnosis not present

## 2016-08-17 DIAGNOSIS — E785 Hyperlipidemia, unspecified: Secondary | ICD-10-CM | POA: Diagnosis not present

## 2016-08-17 DIAGNOSIS — I2510 Atherosclerotic heart disease of native coronary artery without angina pectoris: Secondary | ICD-10-CM | POA: Diagnosis not present

## 2016-08-17 DIAGNOSIS — R0600 Dyspnea, unspecified: Secondary | ICD-10-CM | POA: Diagnosis not present

## 2016-08-17 DIAGNOSIS — I10 Essential (primary) hypertension: Secondary | ICD-10-CM | POA: Diagnosis not present

## 2016-08-17 DIAGNOSIS — J4540 Moderate persistent asthma, uncomplicated: Secondary | ICD-10-CM | POA: Diagnosis not present

## 2016-08-17 NOTE — Patient Outreach (Signed)
[facility]) Care Management  [place]  08/17/2016   [name] [name] 25-Aug-1951 [id number]  Subjective: Telephone call to patient for monthly call. Patient reports he is doing pretty good.  He reports that he is continuing with pulmonary rehab.  He still reports some issues with stamina and is to under go a sleep apnea test soon. Discussed with patient sleep apnea and how it makes a difference in how he feels. He verbalized understanding.  Patient reports weight at 238 lbs.  Discussed with patient signs and symptoms of heart failure and when to notify physician.  He verbalized understanding.    Objective:   Encounter Medications:  Outpatient Encounter Prescriptions as of 08/17/2016  Medication Sig Note  . ADVAIR DISKUS 250-50 MCG/DOSE AEPB Inhale 1 puff into the lungs 2 (two) times daily.   Marland Kitchen albuterol (PROVENTIL HFA;VENTOLIN HFA) 108 (90 Base) MCG/ACT inhaler Inhale 2 puffs into the lungs every 6 (six) hours as needed.   Marland Kitchen albuterol (PROVENTIL) (2.5 MG/3ML) 0.083% nebulizer solution Take 3 mLs (2.5 mg total) by nebulization every 6 (six) hours as needed for wheezing or shortness of breath.   Marland Kitchen amiodarone (PACERONE) 200 MG tablet Take 1 tablet (200 mg total) by mouth 2 (two) times daily.   Marland Kitchen aspirin EC 81 MG tablet Take 81 mg by mouth daily.     Marland Kitchen atorvastatin (LIPITOR) 40 MG tablet Take 1 tablet (40 mg total) by mouth daily at 6 PM.   . clomiPHENE (CLOMID) 50 MG tablet 1/4 tab daily   . clotrimazole-betamethasone (LOTRISONE) cream Apply 1 application topically 2 (two) times daily. (Patient taking differently: Apply 1 application topically 2 (two) times daily as needed (FOR RASH). )   . colchicine 0.6 MG tablet Take 1 tablet (0.6 mg total) by mouth 2 (two) times daily.   . digoxin (LANOXIN) 0.125 MG tablet Take 1 tablet (0.125 mg total) by mouth daily.   . ferrous sulfate 325 (65 FE) MG tablet Take 325 mg by mouth daily with breakfast. Reported on 03/16/2016 02/07/2016:  Will pick it up over the counter per patient.  . furosemide (LASIX) 20 MG tablet TAKE 1 TABLET (20 MG TOTAL) BY MOUTH DAILY.   Marland Kitchen levETIRAcetam (KEPPRA) 750 MG tablet Take 2 tablets (1,500 mg total) by mouth 2 (two) times daily.   Marland Kitchen lisinopril (PRINIVIL,ZESTRIL) 20 MG tablet Take 20 mg by mouth daily.    . metoprolol succinate (TOPROL-XL) 50 MG 24 hr tablet Take 50 mg by mouth daily.  05/03/2016: Received from: External Pharmacy  . nitroGLYCERIN (NITROSTAT) 0.4 MG SL tablet Place 1 tablet (0.4 mg total) under the tongue every 5 (five) minutes as needed for chest pain.   . pantoprazole (PROTONIX) 40 MG tablet Take 40 mg by mouth daily.  03/08/2016: Received from: External Pharmacy  . tamoxifen (NOLVADEX) 10 MG tablet Take 1 tablet (10 mg total) by mouth daily.   . tamsulosin (FLOMAX) 0.4 MG CAPS capsule Take 1 capsule (0.4 mg total) by mouth daily.    No facility-administered encounter medications on file as of 08/17/2016.     Functional Status:  In your present state of health, do you have any difficulty performing the following activities: 04/27/2016 03/23/2016  Hearing? N N  Vision? N N  Difficulty concentrating or making decisions? N N  Walking or climbing stairs? N N  Dressing or bathing? N N  Doing errands, shopping? N N  Preparing Food and eating ? N N  Using the Toilet? N  N  In the past six months, have you accidently leaked urine? N N  Do you have problems with loss of bowel control? N N  Managing your Medications? N N  Managing your Finances? N N  Housekeeping or managing your Housekeeping? N N  Some recent data might be hidden    Fall/Depression Screening: PHQ 2/9 Scores 08/17/2016 07/20/2016 05/24/2016 05/08/2016 04/27/2016 03/23/2016 03/21/2016  PHQ - 2 Score 0 0 0 0 0 0 0    Assessment: Patient continues to benefit from health coach outreach for disease management and support.    Plan:  [facility] CM Care Plan Problem One   Flowsheet Row Most Recent Value  Care Plan Problem One   Heart failure knowledge deficit  Role Documenting the Problem One  [place] for Problem One  Active  [facility] Long Term Goal (31-90 days)  Patient will be able to identify heart failure zones within 90 days.    [facility] Long Term Goal Start Date  08/17/16 [goal continued]  Interventions for Problem One Long Term Goal  RN Health Coach reviewed with patient heart failure zones and when to notify physician.       RN Health Coach will contact patient in the month of September and patient agrees to next outreach.  Jone Baseman, RN, MSN [place] [phone]

## 2016-08-17 NOTE — Progress Notes (Signed)
Daily Session Note  Patient Details  Name: [name] [name] MRN: [id number] Date of Birth: Jan 24, 1951 Referring Provider:   April Manson Pulmonary Rehab Walk Test from 05/18/2016 in [place]  Referring Provider  Dr. Lavell Anchors      Encounter Date: 08/17/2016  Check In:     Session Check In - 08/17/16 1246      Check-In   [facility]   Staff Present Su Hilt, MS, ACSM RCEP, Exercise Physiologist;Annedrea Stackhouse, RN, MHA;Ashwin Tibbs Rollene Rotunda, RN, Roque Cash, RN   Supervising physician immediately available to respond to emergencies [facility] immediately available   Physician(s) Dr. Allyson Sabal   Medication changes reported     No   Fall or balance concerns reported    No   Warm-up and Cool-down Performed as group-led instruction   Resistance Training Performed Yes   VAD Patient? No     Pain Assessment   Currently in Pain? No/denies   Multiple Pain Sites No      Capillary Blood Glucose: No results found for this or any previous visit (from the past 24 hour(s)).      Exercise Prescription Changes - 08/17/16 1247      Response to Exercise   Blood Pressure (Admit) 110/60   Blood Pressure (Exercise) 130/70   Blood Pressure (Exit) 124/76   Heart Rate (Admit) 66 bpm   Heart Rate (Exercise) 99 bpm   Heart Rate (Exit) 75 bpm   Oxygen Saturation (Admit) 98 %   Oxygen Saturation (Exercise) 99 %   Oxygen Saturation (Exit) 93 %   Rating of Perceived Exertion (Exercise) 12   Perceived Dyspnea (Exercise) 2   Duration Progress to 45 minutes of aerobic exercise without signs/symptoms of physical distress   Intensity THRR unchanged     Progression   Progression Continue to progress workloads to maintain intensity without signs/symptoms of physical distress.     Resistance Training   Training Prescription Yes   Weight blue bands   Reps 10-12  10 minutes of strength training     Interval Training   Interval  Training No     Arm Ergometer   Level 6   Minutes 17     Track   Laps 8   Minutes 17     Goals Met:  Improved SOB with ADL's Using PLB without cueing & demonstrates good technique Exercise tolerated well No report of cardiac concerns or symptoms Strength training completed today  Goals Unmet:  Not Applicable  Comments: Service time is from 1030 to 1235   Dr. Rush Farmer serving as medical director for the [facility] Pulmonary Rehab.

## 2016-08-22 ENCOUNTER — Encounter (HOSPITAL_COMMUNITY)
Admission: RE | Admit: 2016-08-22 | Discharge: 2016-08-22 | Disposition: A | Discharge status: Home / Self Care | Payer: Commercial Managed Care - HMO | Source: Ambulatory Visit | Attending: Internal Medicine | Admitting: Internal Medicine

## 2016-08-22 VITALS — Wt 242.3 lb

## 2016-08-22 DIAGNOSIS — R0600 Dyspnea, unspecified: Secondary | ICD-10-CM

## 2016-08-22 DIAGNOSIS — J4540 Moderate persistent asthma, uncomplicated: Secondary | ICD-10-CM | POA: Diagnosis not present

## 2016-08-22 DIAGNOSIS — R0689 Other abnormalities of breathing: Secondary | ICD-10-CM | POA: Diagnosis not present

## 2016-08-22 MED FILL — LOSARTAN POTASSIUM 50 MG TA: 50 | 30 days supply | Qty: 30 | Fill #0

## 2016-08-22 NOTE — Progress Notes (Signed)
Daily Session Note  Patient Details  Name: [name] [name] MRN: [id number] Date of Birth: 1951/03/04 Referring Provider:   April Manson Pulmonary Rehab Walk Test from 05/18/2016 in [place]  Referring Provider  Dr. Lavell Anchors      Encounter Date: 08/22/2016  Check In:     Session Check In - 08/22/16 1053      Check-In   Location [facility]   Staff Present Rosebud Poles, RN, BSN;Josias Tomerlin, MS, ACSM RCEP, Exercise Physiologist;Lisa Ysidro Evert, Felipe Drone, RN, MHA;Portia Rollene Rotunda, RN, BSN   Supervising physician immediately available to respond to emergencies [facility] immediately available   Physician(s) Dr. Marily Memos   Medication changes reported     No   Fall or balance concerns reported    No   Warm-up and Cool-down Performed as group-led instruction   Resistance Training Performed Yes   VAD Patient? No     Pain Assessment   Currently in Pain? No/denies   Multiple Pain Sites No      Capillary Blood Glucose: No results found for this or any previous visit (from the past 24 hour(s)).      Exercise Prescription Changes - 08/22/16 1200      Response to Exercise   Blood Pressure (Admit) 140/70   Blood Pressure (Exercise) 142/70   Blood Pressure (Exit) 118/60   Heart Rate (Admit) 66 bpm   Heart Rate (Exercise) 91 bpm   Heart Rate (Exit) 70 bpm   Oxygen Saturation (Admit) 100 %   Oxygen Saturation (Exercise) 91 %   Oxygen Saturation (Exit) 98 %   Rating of Perceived Exertion (Exercise) 13   Perceived Dyspnea (Exercise) 2   Duration Progress to 45 minutes of aerobic exercise without signs/symptoms of physical distress   Intensity THRR unchanged     Progression   Progression Continue to progress workloads to maintain intensity without signs/symptoms of physical distress.     Resistance Training   Training Prescription Yes   Weight blue bands   Reps 10-12  10 minutes of strength training     Interval Training   Interval Training No     NuStep   Level 5   Minutes 17   METs 2.7     Arm Ergometer   Level 6   Minutes 17     Track   Laps 13   Minutes 17     Goals Met:  Exercise tolerated well No report of cardiac concerns or symptoms Strength training completed today  Goals Unmet:  Not Applicable  Comments: Service time is from 10:30am to 12:00pm    Dr. Rush Farmer is Medical Director for Pulmonary Rehab at [facility].

## 2016-08-24 ENCOUNTER — Encounter (HOSPITAL_COMMUNITY)
Admission: RE | Admit: 2016-08-24 | Discharge: 2016-08-24 | Disposition: A | Discharge status: Home / Self Care | Payer: Commercial Managed Care - HMO | Source: Ambulatory Visit | Attending: Internal Medicine | Admitting: Internal Medicine

## 2016-08-24 DIAGNOSIS — R0689 Other abnormalities of breathing: Secondary | ICD-10-CM | POA: Diagnosis not present

## 2016-08-24 DIAGNOSIS — J4540 Moderate persistent asthma, uncomplicated: Secondary | ICD-10-CM | POA: Diagnosis not present

## 2016-08-24 DIAGNOSIS — R0600 Dyspnea, unspecified: Secondary | ICD-10-CM

## 2016-08-29 ENCOUNTER — Encounter (HOSPITAL_COMMUNITY): Admission: RE | Admit: 2016-08-29 | Payer: Commercial Managed Care - HMO | Source: Ambulatory Visit

## 2016-08-31 ENCOUNTER — Encounter (HOSPITAL_COMMUNITY): Payer: Commercial Managed Care - HMO

## 2016-09-01 ENCOUNTER — Ambulatory Visit (INDEPENDENT_AMBULATORY_CARE_PROVIDER_SITE_OTHER): Payer: Commercial Managed Care - HMO | Admitting: Neurology

## 2016-09-01 DIAGNOSIS — G4761 Periodic limb movement disorder: Secondary | ICD-10-CM

## 2016-09-01 DIAGNOSIS — G4720 Circadian rhythm sleep disorder, unspecified type: Secondary | ICD-10-CM

## 2016-09-01 DIAGNOSIS — G4733 Obstructive sleep apnea (adult) (pediatric): Secondary | ICD-10-CM

## 2016-09-05 ENCOUNTER — Other Ambulatory Visit: Payer: Self-pay

## 2016-09-05 ENCOUNTER — Encounter (HOSPITAL_COMMUNITY): Payer: Commercial Managed Care - HMO

## 2016-09-05 DIAGNOSIS — G4710 Hypersomnia, unspecified: Secondary | ICD-10-CM

## 2016-09-05 DIAGNOSIS — R51 Headache: Secondary | ICD-10-CM

## 2016-09-05 DIAGNOSIS — R519 Headache, unspecified: Secondary | ICD-10-CM

## 2016-09-05 DIAGNOSIS — G4733 Obstructive sleep apnea (adult) (pediatric): Secondary | ICD-10-CM

## 2016-09-05 DIAGNOSIS — R351 Nocturia: Secondary | ICD-10-CM

## 2016-09-05 DIAGNOSIS — E669 Obesity, unspecified: Secondary | ICD-10-CM

## 2016-09-06 ENCOUNTER — Telehealth: Payer: Self-pay | Admitting: Neurology

## 2016-09-06 DIAGNOSIS — G4733 Obstructive sleep apnea (adult) (pediatric): Secondary | ICD-10-CM

## 2016-09-06 DIAGNOSIS — G4761 Periodic limb movement disorder: Secondary | ICD-10-CM

## 2016-09-06 DIAGNOSIS — G4720 Circadian rhythm sleep disorder, unspecified type: Secondary | ICD-10-CM

## 2016-09-06 NOTE — Telephone Encounter (Signed)
Patient referred by Dr. Krista Blue and CM, seen by me on 08/02/16, diagnostic PSG on 09/01/16.   Please call and notify the patient that the recent sleep study did confirm the diagnosis of obstructive sleep apnea and that I recommend treatment for this in the form of CPAP. This will require a repeat sleep study for proper titration and mask fitting. Please explain to patient and arrange for a CPAP titration study. I have placed an order in the chart. Thanks, and please route to [facility] for scheduling next sleep study.  Star Age, MD, PhD [facility]

## 2016-09-06 NOTE — Telephone Encounter (Signed)
I called patient back. He is aware of results. He is willing to proceed with titration study. I will send copy of study to PCP.

## 2016-09-06 NOTE — Telephone Encounter (Signed)
Lm for patient to call back for results.  

## 2016-09-06 NOTE — Progress Notes (Signed)
Patient Name: Meikhi, Floro DOB: 04/19/1951 Study Date: 09/01/2016 Referred By: Dennie Bible, NP/Dr. Marcial Pacas, PCP: Dr. Jarold Song MRN: [id number]  The patient is a 65 year old Male with a history of hypertension, hyperlipidemia, A. fib, CHF, COPD, gout, partial seizures, and obesity, who was previously diagnosed with obstructive sleep apnea in the moderate degree in 2011. Patient is 69 inches tall and a reported weight of 241 pounds, BMI 35.6.  Neck size 16.5 inches.    The patient underwent an attended diagnostic digital polysomnography with the simultaneous recording of electroencephalogram, electro-oculogram, electromyogram, electrocardiogram, respiratory effort, thermister respiratory flow, nasal pressure, pulse oximetry, leg movement, body position, sound, and video.  The data was adequate for interpretation; sleep scoring followed the standards put forth by the American Academy of Sleep Medicine (AASM). ?????  SLEEP ARCHITECTURE The total time in bed was 413.5 minutes, the total sleep time was 323 minutes and WASO was 4 minutes.  Sleep efficiency was 78.1%.  The sleep latency was 7 minutes.  Latency to persistent sleep 7 minutes. Arousal Index was 25.8/hr. Spontaneous arousal index was 10.2/hr.  While asleep, the patient spent 6.5% of the time in stage N1 sleep, 82.2% of the time in stage N2 sleep, 0.8% of the time in stage N3 sleep, and 10.5% of the time in REM sleep. The REM latency was 170.5.  BODY POSITION The patient spent 254.5 minutes in the supine position, 68.5 minutes on the left side, 0 minutes on the right side, and 0 minutes in the prone position.  RESPIRATORY PARAMETERS There were 1 obstructive apneas, 68 hypopneas, 0 central apneas, and 0 mixed apneas.  The overall apnea-hypopnea index (AHI) was 12.8.  The AHI supine was 12, the non-supine was 15.8 and the REM AHI was 31.8.  The respiratory disturbance index (RDI) was 12.8.  The patient's baseline O2 saturation while  awake was 98% and the lowest O2 saturation observed was 82%.  The percentage of sleep time with O2 saturation at 89% or lower was 1.1%.      LEG MOVEMENTS There were 73 periodic leg movements.  PLMS, were observed at an average of 13.6 per hour while asleep and a PLM arousal index of 3.2 per hour.    CARDIAC EVENTS The average heart rate during sleep was 62 bpm with the highest being 83 bpm and the lowest being 45 bpm.  IMPRESSION  Obstructive Sleep Apnea  Periodic Limb Movement Disorder Dysfunctions associated with sleep stages or arousal from sleep  COMMENTS  1.  This patient has overall mild obstructive sleep apnea, severe in REM sleep. Given his medical history, particularly his cardiac history, treatment with positive airway pressure is recommended. This will need a full night sleep study for proper titration and mask fitting.  2. Patient follow up on PAP therapy is required by most insurances within 90 days to determine compliance and benefits of therapy. 3. Mild PLMs were noted - further assessment of contributing factors (i.e., iron deficiency, medication effect, neurologic disorders, RLS) is recommended.  4. For patients with obstructive sleep apnea with increased BMI weight loss can help reduce the severity of the sleep disordered breathing.  5. For all patients we encourage proper "sleep hygiene."  This includes setting a regular sleep and rise time, avoiding caffeine, alcohol and other medications that may affect sleep prior to bedtime and creating a comfortable bedroom environment for sleeping.   6. Adherence to appropriate precautions regarding sleepiness and daytime functioning is recommended. Patient should be advised not  to drive or operate hazardous machinery when tired or sleepy.  7. ?The patient will be followed in sleep clinic. ????     _______________________________  Star Age, MD, PhD Diplomate, American Board of Sleep Medicine

## 2016-09-06 NOTE — Telephone Encounter (Signed)
Patient is returning your call.  

## 2016-09-07 ENCOUNTER — Other Ambulatory Visit: Payer: Self-pay

## 2016-09-07 NOTE — Patient Outreach (Signed)
[name]) Care Management  [place]  09/07/2016   MACRAE DEBAUN Feb 01, 1951 [id number]  Subjective: Telephone call to patient for monthly call.  Patient reports he is doing well. He reports having sleep study done last week and he stopped breathing 15 times that they could count.  Discussed with patient how this was probably why his stamina was not good.  He verbalized understanding and thought about that too. Patient states they are waiting for approval from the insurance company for his CPAP.    Patient reports that his weights are stable.  Discussed heart failure action plan and when to notify physician.  He verbalized understanding.    Objective:   Encounter Medications:  Outpatient Encounter Prescriptions as of 09/07/2016  Medication Sig Note  . ADVAIR DISKUS 250-50 MCG/DOSE AEPB Inhale 1 puff into the lungs 2 (two) times daily.   Marland Kitchen albuterol (PROVENTIL HFA;VENTOLIN HFA) 108 (90 Base) MCG/ACT inhaler Inhale 2 puffs into the lungs every 6 (six) hours as needed.   Marland Kitchen albuterol (PROVENTIL) (2.5 MG/3ML) 0.083% nebulizer solution Take 3 mLs (2.5 mg total) by nebulization every 6 (six) hours as needed for wheezing or shortness of breath.   Marland Kitchen amiodarone (PACERONE) 200 MG tablet Take 1 tablet (200 mg total) by mouth 2 (two) times daily.   Marland Kitchen aspirin EC 81 MG tablet Take 81 mg by mouth daily.     Marland Kitchen atorvastatin (LIPITOR) 40 MG tablet Take 1 tablet (40 mg total) by mouth daily at 6 PM.   . clomiPHENE (CLOMID) 50 MG tablet 1/4 tab daily   . clotrimazole-betamethasone (LOTRISONE) cream Apply 1 application topically 2 (two) times daily. (Patient taking differently: Apply 1 application topically 2 (two) times daily as needed (FOR RASH). )   . colchicine 0.6 MG tablet Take 1 tablet (0.6 mg total) by mouth 2 (two) times daily.   . digoxin (LANOXIN) 0.125 MG tablet Take 1 tablet (0.125 mg total) by mouth daily.   . ferrous sulfate 325 (65 FE) MG tablet Take 325 mg by mouth daily  with breakfast. Reported on 03/16/2016 02/07/2016: Will pick it up over the counter per patient.  . furosemide (LASIX) 20 MG tablet TAKE 1 TABLET (20 MG TOTAL) BY MOUTH DAILY.   Marland Kitchen levETIRAcetam (KEPPRA) 750 MG tablet Take 2 tablets (1,500 mg total) by mouth 2 (two) times daily.   Marland Kitchen lisinopril (PRINIVIL,ZESTRIL) 20 MG tablet Take 20 mg by mouth daily.    . metoprolol succinate (TOPROL-XL) 50 MG 24 hr tablet Take 50 mg by mouth daily.  05/03/2016: Received from: External Pharmacy  . nitroGLYCERIN (NITROSTAT) 0.4 MG SL tablet Place 1 tablet (0.4 mg total) under the tongue every 5 (five) minutes as needed for chest pain.   . pantoprazole (PROTONIX) 40 MG tablet Take 40 mg by mouth daily.  03/08/2016: Received from: External Pharmacy  . tamoxifen (NOLVADEX) 10 MG tablet Take 1 tablet (10 mg total) by mouth daily.   . tamsulosin (FLOMAX) 0.4 MG CAPS capsule Take 1 capsule (0.4 mg total) by mouth daily.    No facility-administered encounter medications on file as of 09/07/2016.     Functional Status:  In your present state of health, do you have any difficulty performing the following activities: 04/27/2016 03/23/2016  Hearing? N N  Vision? N N  Difficulty concentrating or making decisions? N N  Walking or climbing stairs? N N  Dressing or bathing? N N  Doing errands, shopping? N N  Preparing Food and eating ?  N N  Using the Toilet? N N  In the past six months, have you accidently leaked urine? N N  Do you have problems with loss of bowel control? N N  Managing your Medications? N N  Managing your Finances? N N  Housekeeping or managing your Housekeeping? N N  Some recent data might be hidden    Fall/Depression Screening: PHQ 2/9 Scores 09/07/2016 08/24/2016 08/17/2016 07/20/2016 05/24/2016 05/08/2016 04/27/2016  PHQ - 2 Score 0 0 0 0 0 0 0    Assessment: Patient continues to benefit from health coach outreach for disease management and support.    Plan:  [facility] CM Care Plan Problem One   Flowsheet  Row Most Recent Value  Care Plan Problem One  Heart failure knowledge deficit  Role Documenting the Problem One  [place] for Problem One  Active  THN Long Term Goal (31-90 days)  Patient will be able to identify heart failure zones within 90 days.    THN Long Term Goal Start Date  09/07/16 [goal continued]  Interventions for Problem One Long Term Goal  RN Health Coach reiterated with patient heart failure zones and when to notify physician.       RN Health Coach will contact patient in the month of October and patient agrees to next outreach.  Jone Baseman, RN, MSN [place] [phone]

## 2016-09-09 DIAGNOSIS — G4733 Obstructive sleep apnea (adult) (pediatric): Secondary | ICD-10-CM | POA: Diagnosis not present

## 2016-09-09 DIAGNOSIS — I5040 Unspecified combined systolic (congestive) and diastolic (congestive) heart failure: Secondary | ICD-10-CM | POA: Diagnosis not present

## 2016-09-09 DIAGNOSIS — J449 Chronic obstructive pulmonary disease, unspecified: Secondary | ICD-10-CM | POA: Diagnosis not present

## 2016-09-12 NOTE — Progress Notes (Signed)
Discharge Summary  Patient Details  Name: [name] [name] MRN: [id number] Date of Birth: [birth year] Referring Provider:   April [facility]on [facility] Walk Test from 05/18/2016 in [place]  Referring Provider  Dr. Lavell Anchors       Number of Visits: 24  Reason for Discharge:  Patient reached a stable level of exercise. Patient independent in their exercise.  Smoking History:  History  Smoking Status  . Never Smoker  Smokeless Tobacco  . Never Used    Diagnosis:  Dyspnea  ADL UCSD:     Pulmonary Assessment Scores    Row Name 08/23/16 0919         ADL UCSD   ADL Phase Exit     SOB Score total 58        Initial Exercise Prescription:     Initial Exercise Prescription - 05/19/16 0700      Date of Initial Exercise RX and Referring Provider   Date 05/18/16   Referring Provider Dr. Lavell Anchors     NuStep   Level 3   Minutes 15   METs 1.7     Arm Ergometer   Level 3   Watts 30   Minutes 15     Track   Laps 7   Minutes 15     Prescription Details   Frequency (times per week) 2   Duration Progress to 45 minutes of aerobic exercise without signs/symptoms of physical distress     Intensity   THRR 40-80% of Max Heartrate 61-122   Ratings of Perceived Exertion 11-13   Perceived Dyspnea 0-4     Progression   Progression Continue progressive overload as per policy without signs/symptoms or physical distress.     Resistance Training   Training Prescription Yes   Weight BLUE BANDS   Reps 10-12      Discharge Exercise Prescription (Final Exercise Prescription Changes):     Exercise Prescription Changes - 08/22/16 1200      Response to Exercise   Blood Pressure (Admit) 140/70   Blood Pressure (Exercise) 142/70   Blood Pressure (Exit) 118/60   Heart Rate (Admit) 66 bpm   Heart Rate (Exercise) 91 bpm   Heart Rate (Exit) 70 bpm   Oxygen Saturation (Admit) 100 %   Oxygen Saturation (Exercise) 91 %   Oxygen Saturation  (Exit) 98 %   Rating of Perceived Exertion (Exercise) 13   Perceived Dyspnea (Exercise) 2   Duration Progress to 45 minutes of aerobic exercise without signs/symptoms of physical distress   Intensity THRR unchanged     Progression   Progression Continue to progress workloads to maintain intensity without signs/symptoms of physical distress.     Resistance Training   Training Prescription Yes   Weight blue bands   Reps 10-12  10 minutes of strength training     Interval Training   Interval Training No     NuStep   Level 5   Minutes 17   METs 2.7     Arm Ergometer   Level 6   Minutes 17     Track   Laps 13   Minutes 17      Functional Capacity:     Terry Name 05/19/16 0659 08/24/16 1354       6 Minute Walk   Phase Initial Discharge    Distance 1350 feet 1215 feet    Walk Time 6 minutes 6 minutes    #  of Rest Breaks  - 0    MPH 2.55 2.3    METS  - 2.76    RPE 12 12    Perceived Dyspnea   - 2    VO2 Peak 9.7  -    Symptoms No No    Resting HR 73 bpm 60 bpm    Resting BP 110/80 118/70    Max Ex. HR 107 bpm 90 bpm    Max Ex. BP 158/60 120/80      Interval HR   Baseline HR 73  -    1 Minute HR 96  -    2 Minute HR 102  -    3 Minute HR 105  -    4 Minute HR 104  -    5 Minute HR 107  -    6 Minute HR 106  -    2 Minute Post HR 91  -      Interval Oxygen   Interval Oxygen? Yes  -    Baseline Oxygen Saturation % 96 %  -    Baseline Liters of Oxygen 0 L  -    1 Minute Oxygen Saturation % 96 %  -    1 Minute Liters of Oxygen 0 L  -    2 Minute Oxygen Saturation % 96 %  -    2 Minute Liters of Oxygen 0 L  -    3 Minute Oxygen Saturation % 99 %  -    3 Minute Liters of Oxygen 0 L  -    4 Minute Oxygen Saturation % 98 %  -    4 Minute Liters of Oxygen 0 L  -    5 Minute Oxygen Saturation % 98 %  -    5 Minute Liters of Oxygen 0 L  -    6 Minute Oxygen Saturation % 98 %  -    6 Minute Liters of Oxygen 0 L  -    2 Minute Post Oxygen  Saturation % 98 %  -    2 Minute Post Liters of Oxygen 0 L  -       Psychological, QOL, Others - Outcomes: PHQ 2/9: Depression screen [facility] 2/9 09/07/2016 08/24/2016 08/17/2016 07/20/2016 05/24/2016  Decreased Interest 0 0 0 0 0  Down, Depressed, Hopeless 0 0 0 0 0  PHQ - 2 Score 0 0 0 0 0    Quality of Life:     Quality of Life - 08/23/16 0917      Quality of Life Scores   Health/Function Post 19.57 %   Socioeconomic Post 18.72 %   Psych/Spiritual Post 19.79 %   Family Post 10.88 %   GLOBAL Post 18.27 %      Personal Goals: Goals established at orientation with interventions provided to work toward goal.     Personal Goals and Risk Factors at Admission - 05/08/16 1041      Core Components/Risk Factors/Patient Goals on Admission   Increase Strength and Stamina Yes   Intervention Provide advice, education, support and counseling about physical activity/exercise needs.;Develop an individualized exercise prescription for aerobic and resistive training based on initial evaluation findings, risk stratification, comorbidities and participant's personal goals.   Expected Outcomes Achievement of increased cardiorespiratory fitness and enhanced flexibility, muscular endurance and strength shown through measurements of functional capacity and personal statement of participant.   Improve shortness of breath with ADL's Yes   Intervention Provide education, individualized exercise plan and  daily activity instruction to help decrease symptoms of SOB with activities of daily living.   Expected Outcomes Short Term: Achieves a reduction of symptoms when performing activities of daily living.   Develop more efficient breathing techniques such as purse lipped breathing and diaphragmatic breathing; and practicing self-pacing with activity Yes   Intervention Provide education, demonstration and support about specific breathing techniuqes utilized for more efficient breathing. Include techniques such as  pursed lipped breathing, diaphragmatic breathing and self-pacing activity.   Expected Outcomes Short Term: Participant will be able to demonstrate and use breathing techniques as needed throughout daily activities.       Personal Goals Discharge:     Goals and Risk Factor Review    Row Name 05/08/16 1049 06/08/16 1148 07/04/16 0822 08/07/16 0918       Core Components/Risk Factors/Patient Goals Review   Personal Goals Review Increase Strength and Stamina;Improve shortness of breath with ADL's;Develop more efficient breathing techniques such as purse lipped breathing and diaphragmatic breathing and practicing self-pacing with activity. Increase Strength and Stamina;Improve shortness of breath with ADL's;Develop more efficient breathing techniques such as purse lipped breathing and diaphragmatic breathing and practicing self-pacing with activity. Increase Strength and Stamina;Improve shortness of breath with ADL's;Develop more efficient breathing techniques such as purse lipped breathing and diaphragmatic breathing and practicing self-pacing with activity. Increase Strength and Stamina;Improve shortness of breath with ADL's;Develop more efficient breathing techniques such as purse lipped breathing and diaphragmatic breathing and practicing self-pacing with activity.    Review  - patient is slowly making progress toward personal goals patient is slowly making progress toward personal goals patient is slowly making progress toward personal goals    Expected Outcomes To increase strength, stamina and improve shortness of breath with efficient breathing techniques with education and increasing workloads on equipment. see outcomes on Admission ITP see outcomes on Admission ITP see outcomes on Admission ITP       Nutrition & Weight - Outcomes:    Nutrition:     Nutrition Therapy & Goals - 06/08/16 1301      Nutrition Therapy   Diet Therapeutic Lifestyle Changes     Personal Nutrition Goals    Personal Goal #1 0.5-2 lb wt loss per week to a goal wt loss of 6-24 lb at graduation from [place], educate and counsel regarding individualized specific dietary modifications aiming towards targeted core components such as weight, hypertension, lipid management, diabetes, heart failure and other comorbidities.   Expected Outcomes Short Term Goal: Understand basic principles of dietary content, such as calories, fat, sodium, cholesterol and nutrients.;Long Term Goal: Adherence to prescribed nutrition plan.      Nutrition Discharge:     Nutrition Assessments - 08/24/16 1027      Rate Your Plate Scores   Pre Score 37   Post Score 37      Education Questionnaire Score:     Knowledge Questionnaire Score - 08/23/16 0922      Knowledge Questionnaire Score   Pre Score 9/13      Goals reviewed. Patient met all personal goals. It was a pleasure having Mr. Cecchi in the program.

## 2016-09-29 DIAGNOSIS — R768 Other specified abnormal immunological findings in serum: Secondary | ICD-10-CM | POA: Diagnosis not present

## 2016-09-29 DIAGNOSIS — R0600 Dyspnea, unspecified: Secondary | ICD-10-CM | POA: Diagnosis not present

## 2016-09-29 DIAGNOSIS — D8989 Other specified disorders involving the immune mechanism, not elsewhere classified: Secondary | ICD-10-CM | POA: Diagnosis not present

## 2016-10-04 ENCOUNTER — Other Ambulatory Visit: Payer: Self-pay

## 2016-10-04 NOTE — Patient Outreach (Signed)
[facility]) Care Management  [place]  10/04/2016   [name] [name] [birth year] [id number]  Subjective: Telephone call to patient for monthly call. Patient reports he is doing ok but having some frequent urination.  Advised patient if continues to contact physician for exam.  He verbalized understanding.  Patient to go for follow up on his sleep study on Friday.  Patient has not received his CPAP machine at this time.  Patient reports weight remains the same around 239 lbs. Discussed with patient heart failure zones and when to notify physician.    Objective:   Encounter Medications:  Outpatient Encounter Prescriptions as of 10/04/2016  Medication Sig Note  . ADVAIR DISKUS 250-50 MCG/DOSE AEPB Inhale 1 puff into the lungs 2 (two) times daily.   Marland Kitchen albuterol (PROVENTIL HFA;VENTOLIN HFA) 108 (90 Base) MCG/ACT inhaler Inhale 2 puffs into the lungs every 6 (six) hours as needed.   Marland Kitchen albuterol (PROVENTIL) (2.5 MG/3ML) 0.083% nebulizer solution Take 3 mLs (2.5 mg total) by nebulization every 6 (six) hours as needed for wheezing or shortness of breath.   Marland Kitchen amiodarone (PACERONE) 200 MG tablet Take 1 tablet (200 mg total) by mouth 2 (two) times daily.   Marland Kitchen aspirin EC 81 MG tablet Take 81 mg by mouth daily.     Marland Kitchen atorvastatin (LIPITOR) 40 MG tablet Take 1 tablet (40 mg total) by mouth daily at 6 PM.   . clomiPHENE (CLOMID) 50 MG tablet 1/4 tab daily   . clotrimazole-betamethasone (LOTRISONE) cream Apply 1 application topically 2 (two) times daily. (Patient taking differently: Apply 1 application topically 2 (two) times daily as needed (FOR RASH). )   . colchicine 0.6 MG tablet Take 1 tablet (0.6 mg total) by mouth 2 (two) times daily.   . digoxin (LANOXIN) 0.125 MG tablet Take 1 tablet (0.125 mg total) by mouth daily.   . ferrous sulfate 325 (65 FE) MG tablet Take 325 mg by mouth daily with breakfast. Reported on 03/16/2016 02/07/2016: Will pick it up over the counter per patient.   . furosemide (LASIX) 20 MG tablet TAKE 1 TABLET (20 MG TOTAL) BY MOUTH DAILY.   Marland Kitchen levETIRAcetam (KEPPRA) 750 MG tablet Take 2 tablets (1,500 mg total) by mouth 2 (two) times daily.   Marland Kitchen lisinopril (PRINIVIL,ZESTRIL) 20 MG tablet Take 20 mg by mouth daily.    . metoprolol succinate (TOPROL-XL) 50 MG 24 hr tablet Take 50 mg by mouth daily.  05/03/2016: Received from: External Pharmacy  . nitroGLYCERIN (NITROSTAT) 0.4 MG SL tablet Place 1 tablet (0.4 mg total) under the tongue every 5 (five) minutes as needed for chest pain.   . pantoprazole (PROTONIX) 40 MG tablet Take 40 mg by mouth daily.  03/08/2016: Received from: External Pharmacy  . tamoxifen (NOLVADEX) 10 MG tablet Take 1 tablet (10 mg total) by mouth daily.   . tamsulosin (FLOMAX) 0.4 MG CAPS capsule Take 1 capsule (0.4 mg total) by mouth daily.    No facility-administered encounter medications on file as of 10/04/2016.     Functional Status:  In your present state of health, do you have any difficulty performing the following activities: 04/27/2016 03/23/2016  Hearing? N N  Vision? N N  Difficulty concentrating or making decisions? N N  Walking or climbing stairs? N N  Dressing or bathing? N N  Doing errands, shopping? N N  Preparing Food and eating ? N N  Using the Toilet? N N  In the past six months, have you  accidently leaked urine? N N  Do you have problems with loss of bowel control? N N  Managing your Medications? N N  Managing your Finances? N N  Housekeeping or managing your Housekeeping? N N  Some recent data might be hidden    Fall/Depression Screening: PHQ 2/9 Scores 10/04/2016 09/07/2016 08/24/2016 08/17/2016 07/20/2016 05/24/2016 05/08/2016  PHQ - 2 Score 0 0 0 0 0 0 0    Assessment: Patient continues to benefit from health coach outreach for disease management and support.    Plan:  [facility] CM Care Plan Problem One   Flowsheet Row Most Recent Value  Care Plan Problem One  Heart failure knowledge deficit  Role  Documenting the Problem One  [place] for Problem One  Active  THN Long Term Goal (31-90 days)  Patient will be able to identify heart failure zones within 90 days.    THN Long Term Goal Start Date  10/04/16 [goal continued]  Interventions for Problem One Long Term Goal  RN Health Coach reviewed with patient heart failure zones and when to notify physician.       RN Health Coach will contact patient in the month of November and patient agrees to next outreach.  Jone Baseman, RN, MSN [place] [phone]

## 2016-10-06 ENCOUNTER — Ambulatory Visit (INDEPENDENT_AMBULATORY_CARE_PROVIDER_SITE_OTHER): Payer: Commercial Managed Care - HMO | Admitting: Neurology

## 2016-10-06 DIAGNOSIS — G4733 Obstructive sleep apnea (adult) (pediatric): Secondary | ICD-10-CM | POA: Diagnosis not present

## 2016-10-06 DIAGNOSIS — G4720 Circadian rhythm sleep disorder, unspecified type: Secondary | ICD-10-CM

## 2016-10-06 DIAGNOSIS — G4761 Periodic limb movement disorder: Secondary | ICD-10-CM

## 2016-10-09 ENCOUNTER — Encounter: Payer: Self-pay | Admitting: *Deleted

## 2016-10-09 DIAGNOSIS — J449 Chronic obstructive pulmonary disease, unspecified: Secondary | ICD-10-CM | POA: Diagnosis not present

## 2016-10-09 DIAGNOSIS — G4733 Obstructive sleep apnea (adult) (pediatric): Secondary | ICD-10-CM | POA: Diagnosis not present

## 2016-10-09 DIAGNOSIS — I5040 Unspecified combined systolic (congestive) and diastolic (congestive) heart failure: Secondary | ICD-10-CM | POA: Diagnosis not present

## 2016-10-13 ENCOUNTER — Telehealth: Payer: Self-pay | Admitting: Neurology

## 2016-10-13 DIAGNOSIS — G4733 Obstructive sleep apnea (adult) (pediatric): Secondary | ICD-10-CM

## 2016-10-13 NOTE — Progress Notes (Signed)
PATIENT'S NAME:  [name] [name], [name] [name] DOB:      01/15/1951      MR#:    [id number]     DATE OF RECORDING: 10/06/2016 REFERRING M.D.:  Dennie Bible, NP, Marcial Pacas, MD Study Performed:   CPAP  Titration HISTORY:  65 year old right-handed gentleman with hypertension, hyperlipidemia, A. fib, CHF, COPD, gout, partial seizures, and obesity, who returns for a full night CPAP titration study to treat his previously diagnosed OSA. His baseline sleep study from 09/01/16 showed an AHI of 12.8/hour and REM AHI of 31.8/hour. O2 nadir was 82%. The patient's weight 240 pounds with a height of 69 (inches), resulting in a BMI of 35.6 kg/m2.   CURRENT MEDICATIONS: Advair, Albuterol, Amiodarone, Aspirin, Atorvastatin, Clomiphene, Clotrimazole, Colchicine, Digoxin, Ferrous Sulfate, Furosemide, Levetiracetam, Lisinopril, Metoprolol, Nitroglycerin, Pantoprazole, Tamoxifen and Tamsulosin   PROCEDURE:  This is a multichannel digital polysomnogram utilizing the SomnoStar 11.2 system.  Electrodes and sensors were applied and monitored per AASM Specifications.   EEG, EOG, Chin and Limb EMG, were sampled at 200 Hz.  ECG, Snore and Nasal Pressure, Thermal Airflow, Respiratory Effort, CPAP Flow and Pressure, Oximetry was sampled at 50 Hz. Digital video and audio were recorded.      The Technologist noted the patient was fitted with a Resmed Mir[place] FX, wide apparatus. CPAP was initiated at 5 cmH20 with heated humidity per AASM split night standards and pressure was advanced to 6cmH20 because of hypopneas, apneas and desaturations.  At a PAP pressure of 6 cmH20, there was a reduction of the AHI to 0/hour.     Lights Out was at 22:46 and Lights On at 05:06. Total recording time (TRT) was 381 minutes, with a total sleep time (TST) of 316 minutes. The patient's sleep latency was 21.5 minutes with 0 minutes of wake time after sleep onset. REM latency was 77 minutes.  The sleep efficiency was 82.9 %.    SLEEP ARCHITECTURE: WASO (Wake  after sleep onset) was 59.5 minutes with moderate sleep fragmentation noted. There were 24 minutes in Stage N1, 164 minutes Stage N2, 109 minutes Stage N3 and 19 minutes in Stage REM. The percentage of Stage N1 was 7.6%, Stage N2 was 51.9%, Stage N3 was 34.5% and Stage R (REM sleep) was 6%.   RESPIRATORY ANALYSIS:  There was a total of 0 respiratory events: 0 obstructive apneas, 0 central apneas and 0 mixed apneas with a total of 0 apneas and an apnea index (AI) of 0 /hour. There were 0 hypopneas with a hypopnea index of 0/hour. The patient also had 0 respiratory event related arousals (RERAs).      The total APNEA/HYPOPNEA INDEX  (AHI) was 0 /hour and the total RESPIRATORY DISTURBANCE INDEX was 0 .hour  0 events occurred in REM sleep and 0 events in NREM. The REM AHI was 0 /hour versus a non-REM AHI of 0 /hour.  The patient spent 251.5 minutes of total sleep time in the supine position and 65 minutes in non-supine. The supine AHI was 0.0, versus a non-supine AHI of 0.0.  OXYGEN SATURATION & C02:  The baseline 02 saturation was 97%, with the lowest being 92%. Time spent below 89% saturation equaled 0 minutes.  PERIODIC LIMB MOVEMENTS:  The patient had a total of 0 Periodic Limb Movements. The Periodic Limb Movement (PLM) index was 0 and the PLM Arousal index was 0 /hour.  DIAGNOSIS 1.  Obstructive Sleep Apnea  2.  Dysfunctions associated with sleep stages or arousals from sleep  PLANS/RECOMMENDATIONS:  1. This study demonstrates resolution of the patient's obstructive sleep apnea with CPAP therapy. I will, therefore, start the patient on home CPAP treatment at a pressure of 6 cm via nasal mask with heated humidity. The patient should be reminded to be fully compliant with PAP therapy to improve sleep related symptoms and decrease long term cardiovascular risks. The patient should be reminded, that it may take up to 3 months to get fully used to using PAP with all planned sleep. The earlier full  compliance is achieved, the better long term compliance tends to be. Please note that untreated obstructive sleep apnea carries additional perioperative morbidity. Patients with significant obstructive sleep apnea should receive perioperative PAP therapy and the surgeons and particularly the anesthesiologist should be informed of the diagnosis and the severity of the sleep disordered breathing. 2. The patient should be cautioned not to drive, work at heights, or operate dangerous or heavy equipment when tired or sleepy. Review and reiteration of good sleep hygiene measures should be pursued with any patient. 3. Please note that untreated obstructive sleep apnea carries additional perioperative morbidity. Patients with significant obstructive sleep apnea should receive perioperative PAP therapy and the surgeons and particularly the anesthesiologist should be informed of the diagnosis and the severity of the sleep disordered breathing. 4. This study shows sleep fragmentation and abnormal sleep stage percentages; these are nonspecific findings and per se do not signify an intrinsic sleep disorder or a cause for the patient's sleep-related symptoms. Causes include (but are not limited to) the first night effect of the sleep study, circadian rhythm disturbances, medication effect or an underlying mood disorder or medical problem.    A follow up appointment will be scheduled in the Sleep Clinic at [facility].   Please call [phone] with any questions.      I certify that I have reviewed the entire raw data recording prior to the issuance of this report in accordance with the Standards of Accreditation of the [place] [employer] (AASM)     Star Age, MD,PhD Diplomat, American Board of Psychiatry and Neurology  Diplomat, [place] of Sleep Medicine

## 2016-10-13 NOTE — Telephone Encounter (Signed)
Please call and inform patient that I have entered an order for treatment with positive airway pressure (PAP) treatment of obstructive sleep apnea (OSA). He did well during the latest sleep study with CPAP. We will, therefore, arrange for a machine for home use through a DME (durable medical equipment) company of His choice; and I will see the patient back in follow-up in about 8-10 weeks. Please also explain to the patient that I will be looking out for compliance data, which can be downloaded from the machine (stored on an SD card, that is inserted in the machine) or via remote access through a modem, that is built into the machine. At the time of the followup appointment we will discuss sleep study results and how it is going with PAP treatment at home. Please advise patient to bring His machine at the time of the first FU visit, even though this is cumbersome. Bringing the machine for every visit after that will likely not be needed, but often helps for the first visit to troubleshoot if needed. Please re-enforce the importance of compliance with treatment and the need for [place] to monitor compliance data - often an insurance requirement and actually good feedback for the patient as far as how they are doing.  Also remind patient, that any interim PAP machine or mask issues should be first addressed with the DME company, as they can often help better with technical and mask fit issues. Please ask if patient has a preference regarding DME company.  Please also make sure, the patient has a follow-up appointment with me in about 8-10 weeks from the setup date, thanks.  Once you have spoken to the patient - and faxed/routed report to PCP and referring MD (if other than PCP), you can close this encounter, thanks,   Star Age, MD, PhD [facility] ([place])

## 2016-10-16 ENCOUNTER — Other Ambulatory Visit: Payer: Self-pay | Admitting: Pharmacist

## 2016-10-16 ENCOUNTER — Other Ambulatory Visit: Payer: Self-pay

## 2016-10-16 DIAGNOSIS — G4720 Circadian rhythm sleep disorder, unspecified type: Secondary | ICD-10-CM

## 2016-10-16 DIAGNOSIS — G4761 Periodic limb movement disorder: Secondary | ICD-10-CM

## 2016-10-16 DIAGNOSIS — G4733 Obstructive sleep apnea (adult) (pediatric): Secondary | ICD-10-CM

## 2016-10-16 DIAGNOSIS — Z8709 Personal history of other diseases of the respiratory system: Secondary | ICD-10-CM

## 2016-10-16 MED ORDER — ADVAIR DISKUS 250-50 MCG/DOSE IN AEPB: 1.0000 | INHALATION_SPRAY | Freq: Two times a day (BID) | RESPIRATORY_TRACT | 0 refills | Status: DC

## 2016-10-17 NOTE — Telephone Encounter (Signed)
I spoke to patient and he is aware of results and recommendations. He is willing to start treatment. I will send orders to [facility].  Will send report to PCP. Patient will receive a letter reminding him to make f/u appt and stress the importance of compliance.

## 2016-11-02 ENCOUNTER — Other Ambulatory Visit: Payer: Self-pay

## 2016-11-02 NOTE — Patient Outreach (Signed)
[facility]) Care Management  11/02/2016  CHAYNCE COMUNALE [birth year] [id number]   Telephone call to patient for monthly call.  Patient states he is driving right now and he would call back in a few minutes.    Plan: RN Health Coach will wait patient return call. If no return call will attempt patient again in the month of November.    Jone Baseman, RN, MSN [place] [phone]

## 2016-11-09 DIAGNOSIS — G4733 Obstructive sleep apnea (adult) (pediatric): Secondary | ICD-10-CM | POA: Diagnosis not present

## 2016-11-09 DIAGNOSIS — I5040 Unspecified combined systolic (congestive) and diastolic (congestive) heart failure: Secondary | ICD-10-CM | POA: Diagnosis not present

## 2016-11-09 DIAGNOSIS — J449 Chronic obstructive pulmonary disease, unspecified: Secondary | ICD-10-CM | POA: Diagnosis not present

## 2016-11-11 DIAGNOSIS — R269 Unspecified abnormalities of gait and mobility: Secondary | ICD-10-CM | POA: Diagnosis not present

## 2016-11-11 DIAGNOSIS — I5040 Unspecified combined systolic (congestive) and diastolic (congestive) heart failure: Secondary | ICD-10-CM | POA: Diagnosis not present

## 2016-11-11 DIAGNOSIS — G4733 Obstructive sleep apnea (adult) (pediatric): Secondary | ICD-10-CM | POA: Diagnosis not present

## 2016-11-11 DIAGNOSIS — J449 Chronic obstructive pulmonary disease, unspecified: Secondary | ICD-10-CM | POA: Diagnosis not present

## 2016-11-13 ENCOUNTER — Ambulatory Visit: Payer: Commercial Managed Care - HMO | Admitting: Internal Medicine

## 2016-11-21 ENCOUNTER — Other Ambulatory Visit: Payer: Self-pay

## 2016-11-21 ENCOUNTER — Telehealth: Payer: Self-pay | Admitting: Family Medicine

## 2016-11-21 NOTE — Patient Outreach (Signed)
[facility]) Care Management  [place]  11/21/2016   [name] [name] 05-12-1951 [id number]  Subjective: Telephone call to patient for monthly call. Patient reports some frustration with CPAP machine.  Encouraged patient to not give up on his CPAP.  Patient reports he has an appointment on November 28, 2016 for a recheck. Patient reports his weight remains stable. Discussed with patient heart failure and zones. He verbalized understanding.   Objective:   Encounter Medications:  Outpatient Encounter Prescriptions as of 11/21/2016  Medication Sig Note  . ADVAIR DISKUS 250-50 MCG/DOSE AEPB Inhale 1 puff into the lungs 2 (two) times daily.   Marland Kitchen albuterol (PROVENTIL HFA;VENTOLIN HFA) 108 (90 Base) MCG/ACT inhaler Inhale 2 puffs into the lungs every 6 (six) hours as needed.   Marland Kitchen albuterol (PROVENTIL) (2.5 MG/3ML) 0.083% nebulizer solution Take 3 mLs (2.5 mg total) by nebulization every 6 (six) hours as needed for wheezing or shortness of breath.   Marland Kitchen amiodarone (PACERONE) 200 MG tablet Take 1 tablet (200 mg total) by mouth 2 (two) times daily.   Marland Kitchen aspirin EC 81 MG tablet Take 81 mg by mouth daily.     Marland Kitchen atorvastatin (LIPITOR) 40 MG tablet Take 1 tablet (40 mg total) by mouth daily at 6 PM.   . clomiPHENE (CLOMID) 50 MG tablet 1/4 tab daily   . clotrimazole-betamethasone (LOTRISONE) cream Apply 1 application topically 2 (two) times daily. (Patient taking differently: Apply 1 application topically 2 (two) times daily as needed (FOR RASH). )   . colchicine 0.6 MG tablet Take 1 tablet (0.6 mg total) by mouth 2 (two) times daily.   . digoxin (LANOXIN) 0.125 MG tablet Take 1 tablet (0.125 mg total) by mouth daily.   . ferrous sulfate 325 (65 FE) MG tablet Take 325 mg by mouth daily with breakfast. Reported on 03/16/2016 02/07/2016: Will pick it up over the counter per patient.  . furosemide (LASIX) 20 MG tablet TAKE 1 TABLET (20 MG TOTAL) BY MOUTH DAILY.   Marland Kitchen levETIRAcetam (KEPPRA)  750 MG tablet Take 2 tablets (1,500 mg total) by mouth 2 (two) times daily.   Marland Kitchen lisinopril (PRINIVIL,ZESTRIL) 20 MG tablet Take 20 mg by mouth daily.    . metoprolol succinate (TOPROL-XL) 50 MG 24 hr tablet Take 50 mg by mouth daily.  05/03/2016: Received from: External Pharmacy  . nitroGLYCERIN (NITROSTAT) 0.4 MG SL tablet Place 1 tablet (0.4 mg total) under the tongue every 5 (five) minutes as needed for chest pain.   . pantoprazole (PROTONIX) 40 MG tablet Take 40 mg by mouth daily.  03/08/2016: Received from: External Pharmacy  . tamoxifen (NOLVADEX) 10 MG tablet Take 1 tablet (10 mg total) by mouth daily.   . tamsulosin (FLOMAX) 0.4 MG CAPS capsule Take 1 capsule (0.4 mg total) by mouth daily.    No facility-administered encounter medications on file as of 11/21/2016.     Functional Status:  In your present state of health, do you have any difficulty performing the following activities: 04/27/2016 03/23/2016  Hearing? N N  Vision? N N  Difficulty concentrating or making decisions? N N  Walking or climbing stairs? N N  Dressing or bathing? N N  Doing errands, shopping? N N  Preparing Food and eating ? N N  Using the Toilet? N N  In the past six months, have you accidently leaked urine? N N  Do you have problems with loss of bowel control? N N  Managing your Medications? N N  Managing your Finances? N N  Housekeeping or managing your Housekeeping? N N  Some recent data might be hidden    Fall/Depression Screening: PHQ 2/9 Scores 11/21/2016 10/04/2016 09/07/2016 08/24/2016 08/17/2016 07/20/2016 05/24/2016  PHQ - 2 Score 0 0 0 0 0 0 0    Assessment: Patient continues to benefit from health coach outreach for disease management and support.    Plan:  [facility] CM Care Plan Problem One   Flowsheet Row Most Recent Value  Care Plan Problem One  Heart failure knowledge deficit  Role Documenting the Problem One  [place] for Problem One  Active  THN Long Term Goal (31-90 days)   Patient will be able to identify heart failure zones within 90 days.    THN Long Term Goal Start Date  11/21/16 [goal continued]  Interventions for Problem One Long Term Goal  RN Health Coach reiterated with patient heart failure zones and when to notify physician.       RN Health Coach will contact patient in the month of December and patient agrees to next outreach.  Jone Baseman, RN, MSN [place] [phone]

## 2016-11-21 NOTE — Telephone Encounter (Signed)
[place] [id number]   11/21/2016 - 05/20/2017

## 2016-11-21 NOTE — Telephone Encounter (Signed)
Patient needs a renew referral for insurance [employer]. Patient is treated in cardiology diagnosis code i25.10 and i10    Renew referral is needed patient has appointment tomorrow at Dr Terrence Dupont Fax: [phone]  Phone: [phone]

## 2016-11-22 DIAGNOSIS — K219 Gastro-esophageal reflux disease without esophagitis: Secondary | ICD-10-CM | POA: Diagnosis not present

## 2016-11-22 DIAGNOSIS — I482 Chronic atrial fibrillation: Secondary | ICD-10-CM | POA: Diagnosis not present

## 2016-11-22 DIAGNOSIS — D649 Anemia, unspecified: Secondary | ICD-10-CM | POA: Diagnosis not present

## 2016-11-22 DIAGNOSIS — I10 Essential (primary) hypertension: Secondary | ICD-10-CM | POA: Diagnosis not present

## 2016-11-22 DIAGNOSIS — E785 Hyperlipidemia, unspecified: Secondary | ICD-10-CM | POA: Diagnosis not present

## 2016-11-22 DIAGNOSIS — I2510 Atherosclerotic heart disease of native coronary artery without angina pectoris: Secondary | ICD-10-CM | POA: Diagnosis not present

## 2016-11-22 DIAGNOSIS — E669 Obesity, unspecified: Secondary | ICD-10-CM | POA: Diagnosis not present

## 2016-11-22 DIAGNOSIS — J45909 Unspecified asthma, uncomplicated: Secondary | ICD-10-CM | POA: Diagnosis not present

## 2016-11-22 DIAGNOSIS — M1990 Unspecified osteoarthritis, unspecified site: Secondary | ICD-10-CM | POA: Diagnosis not present

## 2016-11-23 ENCOUNTER — Ambulatory Visit (INDEPENDENT_AMBULATORY_CARE_PROVIDER_SITE_OTHER): Payer: Commercial Managed Care - HMO | Admitting: Endocrinology

## 2016-11-23 ENCOUNTER — Encounter: Payer: Self-pay | Admitting: Endocrinology

## 2016-11-23 VITALS — BP 132/82 | HR 63 | Ht 69.0 in | Wt 224.0 lb

## 2016-11-23 DIAGNOSIS — E291 Testicular hypofunction: Secondary | ICD-10-CM

## 2016-11-23 DIAGNOSIS — E559 Vitamin D deficiency, unspecified: Secondary | ICD-10-CM | POA: Diagnosis not present

## 2016-11-23 LAB — T4, FREE: Free T4: 0.79 ng/dL (ref 0.60–1.60)

## 2016-11-23 LAB — TSH: TSH: 1.16 u[IU]/mL (ref 0.35–4.50)

## 2016-11-23 LAB — VITAMIN D 25 HYDROXY (VIT D DEFICIENCY, FRACTURES): VITD: 15.51 ng/mL — AB (ref 30.00–100.00)

## 2016-11-23 MED ORDER — SILDENAFIL CITRATE 20 MG PO TABS: ORAL_TABLET | ORAL | 10 refills | Status: DC

## 2016-11-23 NOTE — Patient Instructions (Addendum)
blood tests are requested for you today.  We'll let you know about the results.   normalization of testosterone is not known to harm you.  however, there are "theoretical" risks, including increased fertility, hair loss, prostate cancer, benign prostate enlargement, blood clots, liver problems, lower hdl ("good cholesterol"), polycythemia (opposite of anemia), sleep apnea, and behavior changes.   Weight loss helps theses conditions also.  I have [facility] a [facility], to refill the generic viagra.  Please come back for a follow-up appointment in 6 months.

## 2016-11-23 NOTE — Progress Notes (Signed)
Subjective:    Patient ID: [name] [name], male    DOB: 1951/08/13, 65 y.o.   MRN: [id number]  HPI Pt returns for f/u of idiopathic central hypogonadism (he has 1 biological child; he has never taken illicit androgens; he denies any h/o infertility; he was seen [facility], where he was dx'ed with hypogonadotropic hypogonadism; however, pt says he wishes to f/u here in [place] instead; he was rx'ed with clomid starting in early 2015).  He takes clomid as rx'ed.  ED sxs persist Vit-d deficiency; hr took high-dose ergocalciferol in mid-2017.  Denies leg cramps Gynecomastia: tamoxifen was rx'ed in early 2017.  Since then, he says breast swelling is slightly improved.  Past Medical History:  Diagnosis Date  . Anemia   . Asthma   . Atrial fibrillation ([place])   . CHF (congestive heart failure) ([place])   . COPD (chronic obstructive pulmonary disease) ([place])   . Heart disease   . Hypertension     Past Surgical History:  Procedure Laterality Date  . CARDIAC CATHETERIZATION N/A 12/08/2015   Procedure: Left Heart Cath and Coronary Angiography;  Surgeon: Charolette Forward, MD;  Location: [place] [facility];  Service: Cardiovascular;  Laterality: N/A;  . ESOPHAGUS SURGERY      Social History   Social History  . Marital status: Single    Spouse name: N/A  . Number of children: 1  . Years of education: college   Occupational History  . [employer]   Social History Main Topics  . Smoking status: Never Smoker  . Smokeless tobacco: Never Used  . Alcohol use No  . Drug use: No  . Sexual activity: Not Currently   Other Topics Concern  . Not on file   Social History Narrative   Patient lives at home alone and he is single.   Patient is semi retired.    Education college.   Right handed.   Caffeine two cokes daily.    Current Outpatient Prescriptions on File Prior to Visit  Medication Sig Dispense Refill  . ADVAIR DISKUS 250-50 MCG/DOSE AEPB Inhale 1 puff into the lungs 2  (two) times daily. 180 each 0  . albuterol (PROVENTIL HFA;VENTOLIN HFA) 108 (90 Base) MCG/ACT inhaler Inhale 2 puffs into the lungs every 6 (six) hours as needed. 1 Inhaler 5  . albuterol (PROVENTIL) (2.5 MG/3ML) 0.083% nebulizer solution Take 3 mLs (2.5 mg total) by nebulization every 6 (six) hours as needed for wheezing or shortness of breath. 150 mL 1  . amiodarone (PACERONE) 200 MG tablet Take 1 tablet (200 mg total) by mouth 2 (two) times daily. 60 tablet 2  . aspirin EC 81 MG tablet Take 81 mg by mouth daily.      Marland Kitchen atorvastatin (LIPITOR) 40 MG tablet Take 1 tablet (40 mg total) by mouth daily at 6 PM. 30 tablet 3  . clomiPHENE (CLOMID) 50 MG tablet 1/4 tab daily 10 tablet 5  . clotrimazole-betamethasone (LOTRISONE) cream Apply 1 application topically 2 (two) times daily. (Patient taking differently: Apply 1 application topically 2 (two) times daily as needed (FOR RASH). ) 30 g 1  . colchicine 0.6 MG tablet Take 1 tablet (0.6 mg total) by mouth 2 (two) times daily. 60 tablet 3  . digoxin (LANOXIN) 0.125 MG tablet Take 1 tablet (0.125 mg total) by mouth daily. 30 tablet 3  . ferrous sulfate 325 (65 FE) MG tablet Take 325 mg by mouth daily with breakfast. Reported on 03/16/2016    .  furosemide (LASIX) 20 MG tablet TAKE 1 TABLET (20 MG TOTAL) BY MOUTH DAILY. 30 tablet 0  . levETIRAcetam (KEPPRA) 750 MG tablet Take 2 tablets (1,500 mg total) by mouth 2 (two) times daily. 120 tablet 11  . lisinopril (PRINIVIL,ZESTRIL) 20 MG tablet Take 20 mg by mouth daily.     . metoprolol succinate (TOPROL-XL) 50 MG 24 hr tablet Take 50 mg by mouth daily.   3  . nitroGLYCERIN (NITROSTAT) 0.4 MG SL tablet Place 1 tablet (0.4 mg total) under the tongue every 5 (five) minutes as needed for chest pain. 25 tablet 1  . pantoprazole (PROTONIX) 40 MG tablet Take 40 mg by mouth daily.     . tamoxifen (NOLVADEX) 10 MG tablet Take 1 tablet (10 mg total) by mouth daily. 30 tablet 11  . tamsulosin (FLOMAX) 0.4 MG CAPS capsule  Take 1 capsule (0.4 mg total) by mouth daily. 30 capsule 3   No current facility-administered medications on file prior to visit.     No Known Allergies  Family History  Problem Relation Age of Onset  . Cancer Mother     BP 132/82   Pulse 63   Ht 5\' 9"  (1.753 m)   Wt 224 lb (101.6 kg)   SpO2 94%   BMI 33.08 kg/m    Review of Systems Denies numbness.  No change in chronic leg swelling.      Objective:   Physical Exam VITAL SIGNS:  See vs page.  GENERAL: no distress. Chest wall: severe bilat pseudogynecomastia is again noted  Ext: 1+ bilat leg edema.     Lab Results  Component Value Date   TSH 1.16 11/23/2016   Free testosterone=normal Vit-D=15    Assessment & Plan:  Vit-d deficiency, persistent. i have sent a prescription to your pharmacy, for ergocalciferol Hypogonadism: well-controlled. Please continue the same clomid Gynecomastia/pseudogynecomastia: he declines to increase tamoxifen.   ED: persistent.    Patient is advised the following: Patient Instructions  blood tests are requested for you today.  We'll let you know about the results.   normalization of testosterone is not known to harm you.  however, there are "theoretical" risks, including increased fertility, hair loss, prostate cancer, benign prostate enlargement, blood clots, liver problems, lower hdl ("good cholesterol"), polycythemia (opposite of anemia), sleep apnea, and behavior changes.   Weight loss helps theses conditions also.  I [facility], to refill the generic viagra.  Please come back for a follow-up appointment in 6 months.

## 2016-11-25 LAB — TESTOSTERONE,FREE AND TOTAL
TESTOSTERONE: 155 ng/dL — AB (ref 264–916)
Testosterone, Free: 8 pg/mL (ref 6.6–18.1)

## 2016-11-25 MED ORDER — VITAMIN D (ERGOCALCIFEROL) 1.25 MG (50000 UNIT) PO CAPS: 50000.0000 [IU] | ORAL_CAPSULE | ORAL | 0 refills | Status: DC

## 2016-12-08 ENCOUNTER — Telehealth: Payer: Self-pay | Admitting: Neurology

## 2016-12-08 NOTE — Telephone Encounter (Signed)
PT SAYS HE THINKS HE BLACKED OUT IN THE BATHROOM FLOOR AND WOKE UP AND THE WATER WAS RUNNING. HE SAYS SHE DOES NOT REMEMBER EVEN TURNING THE WATER ON. PT REQ RET CALL

## 2016-12-09 DIAGNOSIS — J449 Chronic obstructive pulmonary disease, unspecified: Secondary | ICD-10-CM | POA: Diagnosis not present

## 2016-12-09 DIAGNOSIS — I5040 Unspecified combined systolic (congestive) and diastolic (congestive) heart failure: Secondary | ICD-10-CM | POA: Diagnosis not present

## 2016-12-09 DIAGNOSIS — G4733 Obstructive sleep apnea (adult) (pediatric): Secondary | ICD-10-CM | POA: Diagnosis not present

## 2016-12-10 ENCOUNTER — Encounter: Payer: Self-pay | Admitting: Neurology

## 2016-12-11 ENCOUNTER — Other Ambulatory Visit: Payer: Self-pay

## 2016-12-11 DIAGNOSIS — J449 Chronic obstructive pulmonary disease, unspecified: Secondary | ICD-10-CM | POA: Diagnosis not present

## 2016-12-11 DIAGNOSIS — G4733 Obstructive sleep apnea (adult) (pediatric): Secondary | ICD-10-CM | POA: Diagnosis not present

## 2016-12-11 DIAGNOSIS — I5040 Unspecified combined systolic (congestive) and diastolic (congestive) heart failure: Secondary | ICD-10-CM | POA: Diagnosis not present

## 2016-12-11 DIAGNOSIS — R269 Unspecified abnormalities of gait and mobility: Secondary | ICD-10-CM | POA: Diagnosis not present

## 2016-12-11 NOTE — Telephone Encounter (Signed)
Spoke to patient - states he had an event of altered level of consciousness on 12/07/16.  He woke up in the bathroom floor with the water running.  He does not remember walking into the bathroom or turning the water on.  He estimates being unconscious for 15 minutes (based on the show playing on the TV).  He returned to baseline shortly after the episode.  He is still taking his anti-convulsants, as prescribed and has denied missing any doses.  He is due for a follow up appt and has been scheduled with Hoyle Sauer.

## 2016-12-11 NOTE — Telephone Encounter (Signed)
Left message for a return call

## 2016-12-11 NOTE — Patient Outreach (Signed)
[facility]) Care Management  [place]  12/11/2016   [name] [name] 01-06-1951 [id number]  Subjective: Telephone call to patient for monthly call.  Patient reports he is doing better.  He reports a ?black out spell on last Thursday. He reports that he remembers being in the chair at home taking a nap then he found himself on the floor in the bathroom with the water running.  Patient reports he called neurology for an earlier appointment and the earliest appointment is 01-03-17.  Discussed with patient importance of follow up following an event. Advised patient on notifying primary physician and utilizing urgent care in the future.  He verbalized understanding.  Patient reports he continues to adjust to CPAP machine but does have some frustration.  Encouraged patient to continue utilization.  He verbalized understanding. Patient reports that he feels his heart failure is relatively stable.  Discussed with patient heart failure zones and when to notify physician.  He verbalized understanding.    Objective:   Encounter Medications:  Outpatient Encounter Prescriptions as of 12/11/2016  Medication Sig Note  . ADVAIR DISKUS 250-50 MCG/DOSE AEPB Inhale 1 puff into the lungs 2 (two) times daily.   Marland Kitchen albuterol (PROVENTIL HFA;VENTOLIN HFA) 108 (90 Base) MCG/ACT inhaler Inhale 2 puffs into the lungs every 6 (six) hours as needed.   Marland Kitchen albuterol (PROVENTIL) (2.5 MG/3ML) 0.083% nebulizer solution Take 3 mLs (2.5 mg total) by nebulization every 6 (six) hours as needed for wheezing or shortness of breath.   Marland Kitchen amiodarone (PACERONE) 200 MG tablet Take 1 tablet (200 mg total) by mouth 2 (two) times daily.   Marland Kitchen aspirin EC 81 MG tablet Take 81 mg by mouth daily.     Marland Kitchen atorvastatin (LIPITOR) 40 MG tablet Take 1 tablet (40 mg total) by mouth daily at 6 PM.   . clomiPHENE (CLOMID) 50 MG tablet 1/4 tab daily   . clotrimazole-betamethasone (LOTRISONE) cream Apply 1 application topically 2 (two)  times daily. (Patient taking differently: Apply 1 application topically 2 (two) times daily as needed (FOR RASH). )   . colchicine 0.6 MG tablet Take 1 tablet (0.6 mg total) by mouth 2 (two) times daily.   . digoxin (LANOXIN) 0.125 MG tablet Take 1 tablet (0.125 mg total) by mouth daily.   . ferrous sulfate 325 (65 FE) MG tablet Take 325 mg by mouth daily with breakfast. Reported on 03/16/2016 02/07/2016: Will pick it up over the counter per patient.  . furosemide (LASIX) 20 MG tablet TAKE 1 TABLET (20 MG TOTAL) BY MOUTH DAILY.   Marland Kitchen levETIRAcetam (KEPPRA) 750 MG tablet Take 2 tablets (1,500 mg total) by mouth 2 (two) times daily.   Marland Kitchen lisinopril (PRINIVIL,ZESTRIL) 20 MG tablet Take 20 mg by mouth daily.    . metoprolol succinate (TOPROL-XL) 50 MG 24 hr tablet Take 50 mg by mouth daily.  05/03/2016: Received from: External Pharmacy  . nitroGLYCERIN (NITROSTAT) 0.4 MG SL tablet Place 1 tablet (0.4 mg total) under the tongue every 5 (five) minutes as needed for chest pain.   . pantoprazole (PROTONIX) 40 MG tablet Take 40 mg by mouth daily.  03/08/2016: Received from: External Pharmacy  . sildenafil (REVATIO) 20 MG tablet 2-5 pills as needed for ED symptoms   . tamoxifen (NOLVADEX) 10 MG tablet Take 1 tablet (10 mg total) by mouth daily.   . tamsulosin (FLOMAX) 0.4 MG CAPS capsule Take 1 capsule (0.4 mg total) by mouth daily.   . Vitamin D, Ergocalciferol, (DRISDOL)  50000 units CAPS capsule Take 1 capsule (50,000 Units total) by mouth 3 (three) times a week.    No facility-administered encounter medications on file as of 12/11/2016.     Functional Status:  In your present state of health, do you have any difficulty performing the following activities: 04/27/2016 03/23/2016  Hearing? N N  Vision? N N  Difficulty concentrating or making decisions? N N  Walking or climbing stairs? N N  Dressing or bathing? N N  Doing errands, shopping? N N  Preparing Food and eating ? N N  Using the Toilet? N N  In the  past six months, have you accidently leaked urine? N N  Do you have problems with loss of bowel control? N N  Managing your Medications? N N  Managing your Finances? N N  Housekeeping or managing your Housekeeping? N N  Some recent data might be hidden    Fall/Depression Screening: PHQ 2/9 Scores 12/11/2016 11/21/2016 10/04/2016 09/07/2016 08/24/2016 08/17/2016 07/20/2016  PHQ - 2 Score 0 0 0 0 0 0 0    Assessment: Patient continues to benefit from health coach outreach for disease management and support.    Plan:  [facility] CM Care Plan Problem One   Flowsheet Row Most Recent Value  Care Plan Problem One  Heart failure knowledge deficit  Role Documenting the Problem One  [place] for Problem One  Active  THN Long Term Goal (31-90 days)  Patient will be able to identify heart failure zones within 90 days.    THN Long Term Goal Start Date  11/21/16 [goal continued]  Interventions for Problem One Long Term Goal  RN Health Coach reviewed with patient heart failure zones and when to notify physician.      [facility] CM Care Plan Problem Two   Flowsheet Row Most Recent Value  Care Plan Problem Two  Patient ? black out  Role Documenting the Problem [place] for Problem Two  Active  THN CM Short Term Goal #1 (0-30 days)  Patient will seek medical treatment for any additional black out spells within 30 days.  THN CM Short Term Goal #1 Start Date  12/11/16  Interventions for Short Term Goal #2   [place] discussed with patient imoprtance of seeking medical treatment for ? black out spells.  Patient has sooner appointment with neurologist on 01-03-17.     RN Health Coach will contact patient in the month of January and patient agrees to next outreach.  Jone Baseman, RN, MSN [place] [phone]

## 2016-12-12 ENCOUNTER — Encounter: Payer: Self-pay | Admitting: Nurse Practitioner

## 2016-12-12 ENCOUNTER — Ambulatory Visit (INDEPENDENT_AMBULATORY_CARE_PROVIDER_SITE_OTHER): Payer: Commercial Managed Care - HMO | Admitting: Nurse Practitioner

## 2016-12-12 VITALS — BP 133/66 | HR 66 | Ht 69.0 in | Wt 244.8 lb

## 2016-12-12 DIAGNOSIS — G4733 Obstructive sleep apnea (adult) (pediatric): Secondary | ICD-10-CM | POA: Diagnosis not present

## 2016-12-12 DIAGNOSIS — Z9989 Dependence on other enabling machines and devices: Secondary | ICD-10-CM | POA: Diagnosis not present

## 2016-12-12 DIAGNOSIS — R569 Unspecified convulsions: Secondary | ICD-10-CM

## 2016-12-12 NOTE — Progress Notes (Signed)
I have reviewed and agreed above plan. 

## 2016-12-12 NOTE — Progress Notes (Signed)
[facility]  PATIENT: [name] [name] DOB: 01/09/1951   REASON FOR VISIT: Follow-up for seizure-like activity, essential hypertension HISTORY FROM: Patient    HISTORY OF PRESENT ILLNESS: HISTORYYYPatient is a 65 years old male, referred by his primary care physician Dr. Annitta Needs for evaluation of recurrent right arm shaking He has past medical history of hypertension, paroxysmal A. fib asthma, GERD , hypogonadism Since 2012 he began to have stereotypical episodes of sudden onset involuntary right arm large amplitude shaking, with no right face, leg involvement, no loss of consciousness, lasting 2-3 minutes, it happened, average couple times each week, no generalized seizure  He reported that his brother, and father had similar event, was diagnosed with epilepsy Over the years, he has gradually developed feminine features, including gynecomastia, voice change, erectile dysfunction, this is under evaluation of his endocrinologist. EEG was normal MRI brain Oct 2015 there was evidence of multiple periventricular white matter disease, most likely small vessel disease  UPDATE Dec 17th 2015: YY I have started Keppra 500 mg twice a day since October 2015, he tolerated the medication very well, there was significant less episodes, most recent December 09 2014, sudden onset uncontrollable large amplitude forceful right arm shaking, no loss of consciousness, lasting for 1 minutes, Laboratory showed elevated ESR 57, positive ANA, double-stranded DNA, normal G64, folic acid, negative RPR, HIV.  UPDATE April 7th 2016YY:He still has recurrent episode about once a week, Last one was in April 3rd 2016, suddenly, he had right arm shaking, uncontrollable, it stopped in 2 minutes. NO LOC He is now taking Keppra 1054m bid, He reported, he had much less episode while taking Keppra  UPDATE Sep 15 2016YY:He is doing very well, since his Keppra dosage was increased to 750 mg 2 tablets twice  a day, he only had one recurrent right arm jerking movement, lasting for a few minutes, no loss of consciousness, but he does has mild confusion during episode, does not involving his right face or right leg.  UPDATE 03/15/2017CM Mr. MSusman 65year old male returns for follow-up. He continues to have stereotypical episodes of sudden onset involuntary right arm large amplitude shaking, with no right face, leg involvement, no loss of consciousness, lasting 2-3 minutes, it happened, average couple times each week, no generalized seizure. He is currently taking Keppra 1500 twice daily. A sleep deprived EEG was ordered in April of last year but this was never scheduled. Blood pressure is well controlled in the office today. MRI of the brain in 2015 without acute findings. He was admitted to the hospital back in December  for pneumonia. He still says he is weak from this. He returns for reevaluation. UPDATE 07/26/2017CM Mr. MCahall 65year old male returns for follow-up. He has stereotypical episodes of sudden onset involuntary right arm large amplitude shaking with no right face or leg involvement or loss of consciousness. These episodes usually last 2-3 minutes. When last seen happening several times a week he claims he has had maybe 5 events since last seen. He also tells me today he was diagnosed with obstructive apnea about 10 years ago but could not get adjusted to the mask. Sleep deprived EEG recently was normal MRI of the brain in 2015 without acute findings.He does complain of daytime drowsiness and ESS score is 10 he returns for reevaluation UPDATE 12/19/2017CM Mr. MCalderwood89year old male returns for follow-up after an episode of altered level of consciousness on 12/07/2016. He says he woke up on the bathroom floor. He does not remember walking  into the bathroom or turning water on. He returned to baseline shortly after the episode. He continues to take his Keppra 1500 mg twice daily and denies missing any  doses. He was recently diagnosed with obstructive sleep apnea and has been on CPAP for about one month. His mask does not fit well. Sleep deprived EEG in the past is been normal and MRI of the brain without acute findings. He returns for reevaluation REVIEW OF SYSTEMS: Full 14 system review of systems performed and notable only for those listed, all others are neg:  Constitutional: Fatigue  Cardiovascular: neg Ear/Nose/Throat: neg  Skin: neg Eyes: neg Respiratory: Shortness of breath Gastroitestinal: neg  Hematology/Lymphatic: Anemia Endocrine: neg Musculoskeletal:neg Allergy/Immunology: neg Neurological: neg Psychiatric: neg Sleep : snoring , apnea on CPAP   ALLERGIES: No Known Allergies  HOME MEDICATIONS: Outpatient Medications Prior to Visit  Medication Sig Dispense Refill  . ADVAIR DISKUS 250-50 MCG/DOSE AEPB Inhale 1 puff into the lungs 2 (two) times daily. 180 each 0  . albuterol (PROVENTIL HFA;VENTOLIN HFA) 108 (90 Base) MCG/ACT inhaler Inhale 2 puffs into the lungs every 6 (six) hours as needed. 1 Inhaler 5  . albuterol (PROVENTIL) (2.5 MG/3ML) 0.083% nebulizer solution Take 3 mLs (2.5 mg total) by nebulization every 6 (six) hours as needed for wheezing or shortness of breath. 150 mL 1  . amiodarone (PACERONE) 200 MG tablet Take 1 tablet (200 mg total) by mouth 2 (two) times daily. 60 tablet 2  . aspirin EC 81 MG tablet Take 81 mg by mouth daily.      Marland Kitchen atorvastatin (LIPITOR) 40 MG tablet Take 1 tablet (40 mg total) by mouth daily at 6 PM. 30 tablet 3  . clomiPHENE (CLOMID) 50 MG tablet 1/4 tab daily 10 tablet 5  . clotrimazole-betamethasone (LOTRISONE) cream Apply 1 application topically 2 (two) times daily. (Patient taking differently: Apply 1 application topically 2 (two) times daily as needed (FOR RASH). ) 30 g 1  . colchicine 0.6 MG tablet Take 1 tablet (0.6 mg total) by mouth 2 (two) times daily. 60 tablet 3  . digoxin (LANOXIN) 0.125 MG tablet Take 1 tablet (0.125 mg  total) by mouth daily. 30 tablet 3  . ferrous sulfate 325 (65 FE) MG tablet Take 325 mg by mouth daily with breakfast. Reported on 03/16/2016    . furosemide (LASIX) 20 MG tablet TAKE 1 TABLET (20 MG TOTAL) BY MOUTH DAILY. 30 tablet 0  . levETIRAcetam (KEPPRA) 750 MG tablet Take 2 tablets (1,500 mg total) by mouth 2 (two) times daily. 120 tablet 11  . lisinopril (PRINIVIL,ZESTRIL) 20 MG tablet Take 20 mg by mouth daily.     . metoprolol succinate (TOPROL-XL) 50 MG 24 hr tablet Take 50 mg by mouth daily.   3  . nitroGLYCERIN (NITROSTAT) 0.4 MG SL tablet Place 1 tablet (0.4 mg total) under the tongue every 5 (five) minutes as needed for chest pain. 25 tablet 1  . pantoprazole (PROTONIX) 40 MG tablet Take 40 mg by mouth daily.     . sildenafil (REVATIO) 20 MG tablet 2-5 pills as needed for ED symptoms 50 tablet 10  . tamoxifen (NOLVADEX) 10 MG tablet Take 1 tablet (10 mg total) by mouth daily. 30 tablet 11  . tamsulosin (FLOMAX) 0.4 MG CAPS capsule Take 1 capsule (0.4 mg total) by mouth daily. 30 capsule 3  . Vitamin D, Ergocalciferol, (DRISDOL) 50000 units CAPS capsule Take 1 capsule (50,000 Units total) by mouth 3 (three) times a week. 12  capsule 0   No facility-administered medications prior to visit.     PAST MEDICAL HISTORY: Past Medical History:  Diagnosis Date  . Anemia   . Asthma   . Atrial fibrillation ([place])   . CHF (congestive heart failure) ([place])   . COPD (chronic obstructive pulmonary disease) ([place])   . Heart disease   . Hypertension     PAST SURGICAL HISTORY: Past Surgical History:  Procedure Laterality Date  . CARDIAC CATHETERIZATION N/A 12/08/2015   Procedure: Left Heart Cath and Coronary Angiography;  Surgeon: Charolette Forward, MD;  Location: [place] [facility];  Service: Cardiovascular;  Laterality: N/A;  . ESOPHAGUS SURGERY      FAMILY HISTORY: Family History  Problem Relation Age of Onset  . Cancer Mother     SOCIAL HISTORY: Social History   Social History  .  Marital status: Single    Spouse name: N/A  . Number of children: 1  . Years of education: college   Occupational History  . [employer]   Social History Main Topics  . Smoking status: Never Smoker  . Smokeless tobacco: Never Used  . Alcohol use No  . Drug use: No  . Sexual activity: Not Currently   Other Topics Concern  . Not on file   Social History Narrative   Patient lives at home alone and he is single.   Patient is semi retired.    Education college.   Right handed.   Caffeine two cokes daily.     PHYSICAL EXAM  Vitals:   12/12/16 1331  BP: 133/66  Pulse: 66  Weight: 244 lb 12.8 oz (111 kg)  Height: _0  (1.753 m)   Body mass index is 36.15 kg/m. Gen: NAD, conversant, well nourised, obese, well groomed  Cardiovascular: Regular rate rhythm,  Neck: Supple, no carotid bruit. Pulmonary: Clear to auscultation bilateral,   NEUROLOGICAL EXAM:  MENTAL STATUS: Speech:Speech is normal; fluent and spontaneous with normal comprehension.  Cognition:The patient is oriented to person, place, and time; recent and remote memory intact;   language fluent;  normal attention, concentration, fund of knowledge. CRANIAL NERVES: CN II: Visual fields are full to confrontation. Fundoscopic exam is normal with sharp discs   Pupils are  briskly reactive to light.. CN III, IV, VI: extraocular movement are normal. No ptosis. CN V: Facial sensation is intact to pinprick in all 3 divisions bilaterally.   CN VII: Face is symmetric with normal eye closure and smile. CN VIII: Hearing is normal to rubbing fingers CN IX, X: Palate elevates symmetrically. Phonation is normal. CN XI: Head turning and shoulder shrug are intact CN XII: Tongue is midline with normal movements and no atrophy.  MOTOR:There is no pronator drift of out-stretched arms. Muscle bulk and tone are normal. Muscle strength is normal. REFLEXES:Reflexes are 2+ and symmetric at the  biceps, triceps, knees, and ankles. Plantar responses are flexor. SENSORY:Light touch, pinprick, position sense, and vibration sense are intact in fingers and toes. COORDINATION:Rapid alternating movements and fine finger movements are intact. There is no dysmetria on finger-to-nose and heel-knee-shin.  GAIT/STANCE: Gait is steady with normal steps, base, arm swing, and turning. Heel and toe walking are normal. Tandem gait is normal. No assistive device Romberg is absent.     DIAGNOSTIC DATA (LABS, IMAGING, TESTING) - I reviewed patient records, labs, notes, testing and imaging myself where available.  Lab Results  Component Value Date   WBC 7.8 01/23/2016   HGB 10.1 (L) 01/23/2016  HCT 33.5 (L) 01/23/2016   MCV 66.7 (L) 01/23/2016   PLT 454 (H) 01/23/2016      Component Value Date/Time   NA 142 01/23/2016 1515   K 4.2 01/23/2016 1515   CL 105 01/23/2016 1515   CO2 29 01/23/2016 1515   GLUCOSE 103 (H) 01/23/2016 1515   BUN 7 01/23/2016 1515   CREATININE 1.21 01/23/2016 1515   CREATININE 1.06 03/12/2015 1015   CALCIUM 8.8 (L) 01/23/2016 1515   PROT 7.1 12/15/2015 1125   ALBUMIN 1.8 (L) 12/15/2015 1125   AST 62 (H) 12/15/2015 1125   ALT 54 12/15/2015 1125   ALKPHOS 182 (H) 12/15/2015 1125   BILITOT 0.5 12/15/2015 1125   GFRNONAA >60 01/23/2016 1515   GFRNONAA 73 03/12/2015 1015   GFRAA >60 01/23/2016 1515   GFRAA 84 03/12/2015 1015   Lab Results  Component Value Date   CHOL 144 12/09/2015   HDL 57 12/09/2015   LDLCALC 76 12/09/2015   TRIG 53 12/09/2015   CHOLHDL 2.5 12/09/2015     Lab Results  Component Value Date   TSH 1.16 11/23/2016      ASSESSMENT AND PLAN  65 y.o. year old male  has a past medical history of  recurrent episode of uncontrollable right arm shaking, suggestive of simple partial seizure, responded to Keppra , significant decrease of recurrent episodes on titrating dose of Keppra. Episode on 12/07/2016 on the bathroom floor did not  remember going into the bathroom estimated he was out of it for about 15 minutes.  Sleep deprived EEG Normal . Recently diagnosed with obstructive sleep apnea on CPAP.     Discussed with Dr. Krista Blue Continue Keppra at current dose for now Will get Keppra level today Follow up with Dr. Krista Blue 6 monthsVst time 25 min Referral for any seizure activity Dennie Bible, [facility], [facility], [facility] [address] [place], [place] [address] [phone]

## 2016-12-12 NOTE — Patient Instructions (Signed)
Continue Keppra at current dose for now We will check a level and readjust if necessary Follow-up in 6 months with Dr. Krista Blue

## 2016-12-14 LAB — LEVETIRACETAM LEVEL: Levetiracetam Lvl: NOT DETECTED ug/mL (ref 10.0–40.0)

## 2016-12-22 DIAGNOSIS — Z8709 Personal history of other diseases of the respiratory system: Secondary | ICD-10-CM | POA: Insufficient documentation

## 2016-12-22 DIAGNOSIS — Z8719 Personal history of other diseases of the digestive system: Secondary | ICD-10-CM | POA: Insufficient documentation

## 2016-12-22 NOTE — Progress Notes (Signed)
Office Visit Note  Patient: [name] [name]             Date of Birth: Sep 30, 1951           MRN: [id number]             PCP: Arnoldo Morale, MD Referring: Arnoldo Morale, MD Visit Date: 12/27/2016 Occupation: Teacher    Subjective:  Fatigue   History of Present Illness: [name] [name] is a 65 y.o. male with history of abnormal serology and fatigue. He states she's been doing quite well without any rash or joint discomfort. He continues to have fatigue. He rates his fatigue about 6 on the scale of 0-10. He has been sleeping well.  Activities of Daily Living:  Patient reports morning stiffness for 0 minute.   Patient Denies nocturnal pain.  Difficulty dressing/grooming: Denies Difficulty climbing stairs: Denies Difficulty getting out of chair: Denies Difficulty using hands for taps, buttons, cutlery, and/or writing: Denies   Review of Systems  Constitutional: Positive for fatigue. Negative for night sweats and weakness ( ).  HENT: Negative for mouth sores, mouth dryness and nose dryness.   Eyes: Negative for redness and dryness.  Respiratory: Negative for shortness of breath and difficulty breathing.   Cardiovascular: Negative for chest pain, palpitations, hypertension, irregular heartbeat and swelling in legs/feet.  Gastrointestinal: Negative for constipation and diarrhea.  Endocrine: Negative for increased urination.  Musculoskeletal: Negative for arthralgias, joint pain, joint swelling, myalgias, muscle weakness, morning stiffness, muscle tenderness and myalgias.  Skin: Negative for color change, rash, hair loss, nodules/bumps, skin tightness, ulcers and sensitivity to sunlight.  Allergic/Immunologic: Negative for susceptible to infections.  Neurological: Negative for dizziness, fainting, memory loss and night sweats.  Hematological: Negative for swollen glands.  Psychiatric/Behavioral: Negative for depressed mood and sleep disturbance. The patient is not nervous/anxious.      PMFS History:  Patient Active Problem List   Diagnosis Date Noted  . Elevated sed rate 12/26/2016  . History of gastroesophageal reflux (GERD) 12/22/2016  . History of asthma 12/22/2016  . History of COPD 12/22/2016  . Benign prostatic hyperplasia 03/21/2016  . Abnormal involuntary movements 03/08/2016  . COPD (chronic obstructive pulmonary disease) ([place]) 12/24/2015  . Pleural effusion on left   . HCAP (healthcare-associated pneumonia) 12/15/2015  . Left lower lobe pneumonia ([place])   . Sepsis due to pneumonia ([place]) 11/27/2015  . Acute respiratory failure ([place]) 11/27/2015  . Asthma exacerbation 11/27/2015  . Anemia 11/27/2015  . CHF (congestive heart failure) ([place]) 11/27/2015  . Sepsis ([place]) 11/27/2015  . Screening for prostate cancer 03/03/2015  . Gynecomastia 03/03/2015  . Seizures ([place]) 10/09/2014  . Essential hypertension 08/12/2014  . Chest pain 01/24/2014  . Sick sinus syndrome ([place]) 03/21/2013  . TINEA CRURIS 09/30/2010  . Hypogonadism, male 09/23/2010  . GERD 09/01/2010  . DIZZINESS 09/01/2010  . CHEST PAIN UNSPECIFIED 09/01/2010  . Sleep apnea 08/20/2010  . Other fatigue 06/28/2010  . Abnormal involuntary movement 06/28/2010  . Vitamin D deficiency 03/25/2010  . Hypogonadism male 01/20/2010  . ERECTILE DYSFUNCTION 11/26/2008  . Essential hypertension, benign 11/26/2008  . [place] ANEMIA 09/24/2008  . IRON DEFIC ANEMIA [place] DIET IRON INTAKE 09/21/2008  . Atrial fibrillation ([place]) 09/21/2008  . SICK SINUS SYNDROME 09/21/2008  . Asthma 09/21/2008  . ESOPHAGITIS 08/26/2008  . ESOPHAGEAL MOTILITY DISORDER 08/26/2008  . GASTRITIS, ACUTE 08/26/2008    Past Medical History:  Diagnosis Date  . Anemia   . Asthma   . Atrial fibrillation ([place])   .  CHF (congestive heart failure) ([place])   . COPD (chronic obstructive pulmonary disease) ([place])   . Heart disease   . Hypertension     Family History  Problem Relation Age of Onset  . Cancer Mother    Past  Surgical History:  Procedure Laterality Date  . CARDIAC CATHETERIZATION N/A 12/08/2015   Procedure: Left Heart Cath and Coronary Angiography;  Surgeon: Charolette Forward, MD;  Location: [facility];  Service: Cardiovascular;  Laterality: N/A;  . ESOPHAGUS SURGERY     Social History   Social History Narrative   Patient lives at home alone and he is single.   Patient is semi retired.    Education college.   Right handed.   Caffeine two cokes daily.     Objective: Vital Signs: BP 136/76   Pulse 62   Resp 16   Ht _0  (1.702 m)   Wt 243 lb (110.2 kg)   BMI 38.06 kg/m    Physical Exam  Constitutional: He is oriented to person, place, and time. He appears well-developed and well-nourished.  HENT:  Head: Normocephalic and atraumatic.  Eyes: Conjunctivae and EOM are normal. Pupils are equal, round, and reactive to light.  Neck: Normal range of motion. Neck supple.  Cardiovascular: Normal rate, regular rhythm and normal heart sounds.   Pulmonary/Chest: Effort normal and breath sounds normal.  Abdominal: Soft. Bowel sounds are normal.  Neurological: He is alert and oriented to person, place, and time.  Skin: Skin is warm and dry. Capillary refill takes less than 2 seconds.  Psychiatric: He has a normal mood and affect. His behavior is normal.  Nursing note and vitals reviewed.    Musculoskeletal Exam: C-spine, thoracic, lumbar spine good range of motion. Shoulder joints, elbow joints, wrist joints, MCPs PIPs DIPs with good range of motion. Hip joints knee joints ankles MTPs PIPs with good range of motion. No synovitis was noted on examination today.  CDAI Exam: No CDAI exam completed.    Investigation: Findings:  Labs from 01/20/2016 show sed rate elevated at 60, and it was higher than that on 01/13/2016 when it was 81.  On 01/13/2016, CMP was normal.  CBC with diff was normal except for hemoglobin low at 9.9, hematocrit at 31.9.  Note that patient has a history of chronic  anemia.  C3 and C4 are normal.  ANA is negative.  Antigliadin is negative with IgG; IgA was 20, and less than 20 is supposed to be normal so he is at borderline; tTG is negative.  Vitamin D was low at 16, and it was treated in January 2017.  History of low titer double-strand DNA.  His ANA has been negative  and complements have been normal.  He has no clinical features of autoimmune disease.   06/19/2016 CBC hemoglobin 11.7, CMP normal, ESR 22, dsDNA 7 which is indeterminate, vitamin D 16    Imaging: No results found.  Speciality Comments: No specialty comments available.    Procedures:  No procedures performed Allergies: Patient has no known allergies.   Assessment / Plan:     Visit Diagnoses: Elevated sed rate - In 50s to 80s, positive double-stranded DNA, negative ANA, no clinical features of autoimmune disease. His last sedimentation rate is an only 20s. He has no clinical features of autoimmune disease on examination I do not see it necessary to repeat his labs today. I informed him to contact me in case he develops any new symptoms.  Fatigue: He continues to have  some fatigue which may be related to vitamin D deficiency her hypogonadism.  Vitamin D deficiency: He's been taking vitamin D supplements.  He has following other medical problems for which she's been taking care of other physicians:  History of gastroesophageal reflux (GERD)  History of asthma  History of COPD  Essential hypertension, benign  Atrial fibrillation, unspecified type ([place])  SICK SINUS SYNDROME  Uncomplicated asthma  Gastroesophageal reflux disease   Obstructive sleep apnea syndrome  Seizures ([facility])  Congestive heart failure  Other folate deficiency anemias  IRON DEFIC ANEMIA   Hypogonadism, male  TINEA CRURIS    Orders: No orders of the defined types were placed in this encounter.  No orders of the defined types were placed in this encounter.   Face-to-face time spent with  patient was 25 minutes. 50% of time was spent in counseling and coordination of care.  Follow-Up Instructions: Return in about 6 months (around 06/26/2017) for Abnormal labs.   Bo Merino, MD

## 2016-12-26 DIAGNOSIS — R700 Elevated erythrocyte sedimentation rate: Secondary | ICD-10-CM | POA: Insufficient documentation

## 2016-12-27 ENCOUNTER — Ambulatory Visit (INDEPENDENT_AMBULATORY_CARE_PROVIDER_SITE_OTHER): Payer: Commercial Managed Care - HMO | Admitting: Rheumatology

## 2016-12-27 ENCOUNTER — Encounter: Payer: Self-pay | Admitting: Rheumatology

## 2016-12-27 VITALS — BP 136/76 | HR 62 | Resp 16 | Ht 67.0 in | Wt 243.0 lb

## 2016-12-27 DIAGNOSIS — D508 Other iron deficiency anemias: Secondary | ICD-10-CM

## 2016-12-27 DIAGNOSIS — D528 Other folate deficiency anemias: Secondary | ICD-10-CM

## 2016-12-27 DIAGNOSIS — R5383 Other fatigue: Secondary | ICD-10-CM | POA: Diagnosis not present

## 2016-12-27 DIAGNOSIS — G4733 Obstructive sleep apnea (adult) (pediatric): Secondary | ICD-10-CM

## 2016-12-27 DIAGNOSIS — Z8719 Personal history of other diseases of the digestive system: Secondary | ICD-10-CM

## 2016-12-27 DIAGNOSIS — E559 Vitamin D deficiency, unspecified: Secondary | ICD-10-CM

## 2016-12-27 DIAGNOSIS — K219 Gastro-esophageal reflux disease without esophagitis: Secondary | ICD-10-CM | POA: Diagnosis not present

## 2016-12-27 DIAGNOSIS — Z8709 Personal history of other diseases of the respiratory system: Secondary | ICD-10-CM

## 2016-12-27 DIAGNOSIS — J45909 Unspecified asthma, uncomplicated: Secondary | ICD-10-CM | POA: Diagnosis not present

## 2016-12-27 DIAGNOSIS — R569 Unspecified convulsions: Secondary | ICD-10-CM | POA: Diagnosis not present

## 2016-12-27 DIAGNOSIS — I495 Sick sinus syndrome: Secondary | ICD-10-CM

## 2016-12-27 DIAGNOSIS — R700 Elevated erythrocyte sedimentation rate: Secondary | ICD-10-CM | POA: Diagnosis not present

## 2016-12-27 DIAGNOSIS — B356 Tinea cruris: Secondary | ICD-10-CM

## 2016-12-27 DIAGNOSIS — E291 Testicular hypofunction: Secondary | ICD-10-CM

## 2016-12-27 DIAGNOSIS — I10 Essential (primary) hypertension: Secondary | ICD-10-CM

## 2016-12-27 DIAGNOSIS — I509 Heart failure, unspecified: Secondary | ICD-10-CM

## 2016-12-27 DIAGNOSIS — I4891 Unspecified atrial fibrillation: Secondary | ICD-10-CM

## 2017-01-03 ENCOUNTER — Ambulatory Visit: Payer: Self-pay | Admitting: Nurse Practitioner

## 2017-01-04 ENCOUNTER — Telehealth: Payer: Self-pay | Admitting: Neurology

## 2017-01-04 NOTE — Telephone Encounter (Signed)
I reviewed patient's CPAP compliance data from 11/11/2016 through 12/10/2016 which is a total of 30 days, during which time he used his machine only 12 days with percent used days greater than 4 hours and 3% only, indicating noncompliance. Residual AHI and leak highly elevated.  Please reach out to his DME company, [facility] to offer patient mask refit and CPAP treatment reeducation appointment.  Please also call patient to encourage him to make a follow-up appointment in sleep clinic with me. I do not see an appointment pending.

## 2017-01-06 ENCOUNTER — Other Ambulatory Visit: Payer: Self-pay | Admitting: Endocrinology

## 2017-01-08 ENCOUNTER — Other Ambulatory Visit: Payer: Self-pay

## 2017-01-08 NOTE — Telephone Encounter (Signed)
I spoke to patient. He states that he is having trouble with his machine. States that he will use it for 5 hours but the recording will only show 30 sec. I advised him that I will reach out to [facility] and see if we can't get him some help. I made an appt for him in March.

## 2017-01-08 NOTE — Patient Outreach (Signed)
[facility]) Care Management  [place]  01/08/2017   [name] [name] [birth year] [id number]  Subjective: Telephone call to patient for monthly call.  Patient reports he saw the neurologist and had testing for questionable blackout spell. He states everything was ok. Patient reports that his weight is about the same. Discussed with patient heart failure and when to notify physician.  He verbalized understanding.     Objective:   Encounter Medications:  Outpatient Encounter Prescriptions as of 01/08/2017  Medication Sig Note  . ADVAIR DISKUS 250-50 MCG/DOSE AEPB Inhale 1 puff into the lungs 2 (two) times daily.   Marland Kitchen albuterol (PROVENTIL HFA;VENTOLIN HFA) 108 (90 Base) MCG/ACT inhaler Inhale 2 puffs into the lungs every 6 (six) hours as needed.   Marland Kitchen albuterol (PROVENTIL) (2.5 MG/3ML) 0.083% nebulizer solution Take 3 mLs (2.5 mg total) by nebulization every 6 (six) hours as needed for wheezing or shortness of breath.   Marland Kitchen amiodarone (PACERONE) 200 MG tablet Take 1 tablet (200 mg total) by mouth 2 (two) times daily.   Marland Kitchen aspirin EC 81 MG tablet Take 81 mg by mouth daily.     Marland Kitchen atorvastatin (LIPITOR) 40 MG tablet Take 1 tablet (40 mg total) by mouth daily at 6 PM.   . clomiPHENE (CLOMID) 50 MG tablet 1/4 tab daily   . clotrimazole-betamethasone (LOTRISONE) cream Apply 1 application topically 2 (two) times daily. (Patient taking differently: Apply 1 application topically 2 (two) times daily as needed (FOR RASH). )   . colchicine 0.6 MG tablet Take 1 tablet (0.6 mg total) by mouth 2 (two) times daily.   . digoxin (LANOXIN) 0.125 MG tablet Take 1 tablet (0.125 mg total) by mouth daily.   . ferrous sulfate 325 (65 FE) MG tablet Take 325 mg by mouth daily with breakfast. Reported on 03/16/2016 02/07/2016: Will pick it up over the counter per patient.  . furosemide (LASIX) 20 MG tablet TAKE 1 TABLET (20 MG TOTAL) BY MOUTH DAILY.   Marland Kitchen levETIRAcetam (KEPPRA) 750 MG tablet Take 2 tablets  (1,500 mg total) by mouth 2 (two) times daily.   Marland Kitchen lisinopril (PRINIVIL,ZESTRIL) 20 MG tablet Take 20 mg by mouth daily.    . metoprolol succinate (TOPROL-XL) 25 MG 24 hr tablet Take 25 mg by mouth daily. 12/27/2016: Received from: External Pharmacy Received Sig: TAKE 1 TABLET BY MOUTH EVERY DAY  . nitroGLYCERIN (NITROSTAT) 0.4 MG SL tablet Place 1 tablet (0.4 mg total) under the tongue every 5 (five) minutes as needed for chest pain.   . pantoprazole (PROTONIX) 40 MG tablet Take 40 mg by mouth daily.  03/08/2016: Received from: External Pharmacy  . sildenafil (REVATIO) 20 MG tablet 2-5 pills as needed for ED symptoms   . tamoxifen (NOLVADEX) 10 MG tablet Take 1 tablet (10 mg total) by mouth daily.   . tamsulosin (FLOMAX) 0.4 MG CAPS capsule Take 1 capsule (0.4 mg total) by mouth daily.    No facility-administered encounter medications on file as of 01/08/2017.     Functional Status:  In your present state of health, do you have any difficulty performing the following activities: 04/27/2016 03/23/2016  Hearing? N N  Vision? N N  Difficulty concentrating or making decisions? N N  Walking or climbing stairs? N N  Dressing or bathing? N N  Doing errands, shopping? N N  Preparing Food and eating ? N N  Using the Toilet? N N  In the past six months, have you accidently leaked urine? N  N  Do you have problems with loss of bowel control? N N  Managing your Medications? N N  Managing your Finances? N N  Housekeeping or managing your Housekeeping? N N  Some recent data might be hidden    Fall/Depression Screening: PHQ 2/9 Scores 01/08/2017 12/11/2016 11/21/2016 10/04/2016 09/07/2016 08/24/2016 08/17/2016  PHQ - 2 Score 0 0 0 0 0 0 0    Assessment: Patient continues to benefit from health coach outreach for disease management and support.    Plan:  [facility] CM Care Plan Problem One   Flowsheet Row Most Recent Value  Care Plan Problem One  Heart failure knowledge deficit  Role Documenting the Problem  One  [place] for Problem One  Active  THN Long Term Goal (31-90 days)  Patient will be able to identify heart failure zones within 90 days.    THN Long Term Goal Start Date  01/08/17 [goal continued]  Interventions for Problem One Long Term Goal  RN Health Coach discussed with patient heart failure zones and when to notify physician.      [facility] CM Care Plan Problem Two   Flowsheet Row Most Recent Value  Care Plan Problem Two  Patient ? black out  Role Documenting the Problem [place] for Problem Two  Active  THN CM Short Term Goal #1 (0-30 days)  Patient will seek medical treatment for any additional black out spells within 30 days.  THN CM Short Term Goal #1 Start Date  12/11/16  [facility] CM Short Term Goal #1 Met Date   01/08/17  Interventions for Short Term Goal #2   Patient seen and tested by neurology concerning blackout spell.      RN Health Coach will contact patient in the month of February and patient agrees to next outreach.  Jone Baseman, RN, MSN [place] [phone]

## 2017-01-09 DIAGNOSIS — G4733 Obstructive sleep apnea (adult) (pediatric): Secondary | ICD-10-CM | POA: Diagnosis not present

## 2017-01-09 DIAGNOSIS — I5040 Unspecified combined systolic (congestive) and diastolic (congestive) heart failure: Secondary | ICD-10-CM | POA: Diagnosis not present

## 2017-01-09 DIAGNOSIS — J449 Chronic obstructive pulmonary disease, unspecified: Secondary | ICD-10-CM | POA: Diagnosis not present

## 2017-01-11 ENCOUNTER — Ambulatory Visit: Payer: Commercial Managed Care - HMO | Admitting: Internal Medicine

## 2017-01-11 DIAGNOSIS — R269 Unspecified abnormalities of gait and mobility: Secondary | ICD-10-CM | POA: Diagnosis not present

## 2017-01-11 DIAGNOSIS — I5040 Unspecified combined systolic (congestive) and diastolic (congestive) heart failure: Secondary | ICD-10-CM | POA: Diagnosis not present

## 2017-01-11 DIAGNOSIS — J449 Chronic obstructive pulmonary disease, unspecified: Secondary | ICD-10-CM | POA: Diagnosis not present

## 2017-01-11 DIAGNOSIS — G4733 Obstructive sleep apnea (adult) (pediatric): Secondary | ICD-10-CM | POA: Diagnosis not present

## 2017-01-17 DIAGNOSIS — Z0100 Encounter for examination of eyes and vision without abnormal findings: Secondary | ICD-10-CM | POA: Diagnosis not present

## 2017-01-22 ENCOUNTER — Ambulatory Visit: Payer: Self-pay | Admitting: Neurology

## 2017-01-24 ENCOUNTER — Ambulatory Visit (INDEPENDENT_AMBULATORY_CARE_PROVIDER_SITE_OTHER): Payer: Medicare HMO | Admitting: Internal Medicine

## 2017-01-24 ENCOUNTER — Encounter: Payer: Self-pay | Admitting: Internal Medicine

## 2017-01-24 ENCOUNTER — Other Ambulatory Visit: Payer: Self-pay | Admitting: Family Medicine

## 2017-01-24 VITALS — BP 132/86 | HR 68 | Ht 67.0 in | Wt 242.0 lb

## 2017-01-24 DIAGNOSIS — I5189 Other ill-defined heart diseases: Secondary | ICD-10-CM

## 2017-01-24 DIAGNOSIS — R5381 Other malaise: Secondary | ICD-10-CM

## 2017-01-24 DIAGNOSIS — J4540 Moderate persistent asthma, uncomplicated: Secondary | ICD-10-CM | POA: Diagnosis not present

## 2017-01-24 DIAGNOSIS — R0600 Dyspnea, unspecified: Secondary | ICD-10-CM

## 2017-01-24 DIAGNOSIS — R0689 Other abnormalities of breathing: Secondary | ICD-10-CM

## 2017-01-24 DIAGNOSIS — I519 Heart disease, unspecified: Secondary | ICD-10-CM

## 2017-01-24 DIAGNOSIS — Z8709 Personal history of other diseases of the respiratory system: Secondary | ICD-10-CM

## 2017-01-24 NOTE — Progress Notes (Signed)
Subjective:     Patient ID: [name] [name], male   DOB: 01-16-1951, 66 y.o.   MRN: [id number]  HPI  22 never smoker seen for pulmonary consult during hospitalization 11/2015 for LLL HCAP and Pleuropericarditis   01/24/2016 [facility] follow up  Pt was admitted to hospital with chest pain. Went to cath labs. Had A Fib w/ RVR post procedure.  CT chest 12/20 showed moderate pericardial effusion . , small to mod left and small right pleural effusion and patchy bilateral lower lobe opacities. 2 D echo showed mild pericardial effusion with no evidence of tamponade. Autoimmune/Connective tissue disorder workup w/ repeat Rheumatoid factor neg.  Neg ANA . Scleroderma NEG . 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 Marland Kitchen  He was treated with IV abx .  He was felt to have a pleuropericarditis ? Viral related. He improved with stress dose steroids and colchicine .  He is feeling better since discharge.  Decreased cough and dyspnea.  Still has not regained energy level.  CXR today shows improvement with residual basilar pleural thickening ? Scarring noted. Effusions resolved.     OV 05/03/2016  Chief Complaint  Patient presents with  . Follow-up    PFT results. pt states breathing is baseline since last OV. c/o sob & wheezing. denies cough or cp/tightness.      Follow-up dyspnea in the setting of long history of asthma  66 year-old morbidly obese male. He tells me and I'm seeing him for the first time. That he has a lifelong history of asthma. For the last few to several years is progressive dyspnea on exertion. In October 2016 and perhaps in December 2016 he had repeat admissions for hospital-acquired pneumonia and pericardial effusion based on review of the chart and according to his history. He says he was diuresed. He denies having a pericardiocentesis. It appears the pericardial effusion was moderate in size on a CT chest but a follow-up echo a day later in December 2016 showed that it was only small and did not have  tamponade physiology. Since then he's better but he says he set a new low baseline with class 3 dyspnea on exertion relieved by rest. Is also associated with fatigue. He believes his asthma is under control but he is taking lisinopril added by his cardiologist.  Extensive autoimmune antibody panel October 2015 shows trace positive ANA and trace positive double-stranded DNA. Repeat in December 2016 continues to show trace positive double-stranded DNA but ANA was negative.  CT scan of the chest December 13 2015 shows moderate pericardial effusion and some mild bilateral pleural effusions and compressive atelectasis I personally visualized this. Echocardiogram 12/14/2015 that followed this showed only trace pericardial effusion without any tamponade physiology CXR Jan 2017  - clear per reprot    Pulmonary function test 03/29/2016 shows FEV1 1.87 L/72% FVC 2.42 L/72%. Both of post bronchodilator response. Ratio 77. TLC 4.4 L/69% - this is all consistent with restriction and his significant obesity. His DLCO is 23.8 and normal   Exhaled nitric oxide today is 35 ppb and in the gray zone. This is done while on Advair. He does have some wheezing on the right side in the base.    OV 08/07/2016  Chief Complaint  Patient presents with  . Follow-up    Pt c/o increased in cough, SOB and wheeze with exertion. Pt reports that when he is not active he does very well. Pt is still doing rehab 2 days a week.    Follow-up moderate persistent  asthma in the setting of morbid obesity, diastolic dysfunction, history of pericardial effusion with double-stranded DNA positivity, physical deconditioning and ACE inhibitor intake  Last seen May 2017. At that time referred to pulmonary rehabilitation. Since then dyspnea is improved from a scale of 7/10-> 4/10. He still feels he has residual dyspnea needs to improve further. He also tells me in the last 3 months he is on Advair and his asthma has been well controlled with  use of albuterol and no wheezing or shortness of breath or asthma exacerbation episodes. In fact on exam today is not wheezing. He does have a history of pericardial effusion and fluctuating ANA positivity and persistent double-stranded DNA positivity. We referred him to rheumatology but rather not sure if this referral went to. Patient himself is not sure. Also of note he is on ACE inhibitor. We recommended he talk to cardiologist Dr. Terrence Dupont and stop this but he still on it.   OV 01/24/2017  Chief Complaint  Patient presents with  . Follow-up    Pt states his breathing is unchanged since last OV. Pt c/o recent hoarseness. Pt denies cough and CP/tightness.     Follow-up moderate persistent asthma in the setting of morbid obesity, diastolic dysfunction, history of pericardial effusion with double-stranded DNA positivity, physical deconditioning and ACE inhibitor intake  Last seen August 2017. Since then he has een doing daily exercises. His dyspnea though is stable at level VI/X. He feels this is largely due to his obesity. He is asking for some dietary advice. Asthma itself is stable with the help of Advair. He says he has ongoing wheezing on-and-off. He still on lisinopril. He still not come off it even though you bend his cardiologist in the interim. REview  of the old chart shows that he visited Dr. D rheumatologist on 12/27/2016: Summary is that he does not have any autoimmune features clinically and she is placed him on expectant follow-up    has a past medical history of Anemia; Asthma; Atrial fibrillation (HCC); CHF (congestive heart failure) ([place]); COPD (chronic obstructive pulmonary disease) ([place]); Heart disease; and Hypertension.   reports that he has never smoked. He has never used smokeless tobacco.  Past Surgical History:  Procedure Laterality Date  . CARDIAC CATHETERIZATION N/A 12/08/2015   Procedure: Left Heart Cath and Coronary Angiography;  Surgeon: Charolette Forward, MD;   Location: [place] [facility];  Service: Cardiovascular;  Laterality: N/A;  . ESOPHAGUS SURGERY      No Known Allergies  Immunization History  Administered Date(s) Administered  . Influenza,inj,Quad PF,36+ Mos 12/09/2015    Family History  Problem Relation Age of Onset  . Cancer Mother      Current Outpatient Prescriptions:  .  ADVAIR DISKUS 250-50 MCG/DOSE AEPB, INHALE 1 PUFF INTO THE LUNGS 2 (TWO) TIMES DAILY., Disp: 180 each, Rfl: 0 .  albuterol (PROVENTIL HFA;VENTOLIN HFA) 108 (90 Base) MCG/ACT inhaler, Inhale 2 puffs into the lungs every 6 (six) hours as needed., Disp: 1 Inhaler, Rfl: 5 .  albuterol (PROVENTIL) (2.5 MG/3ML) 0.083% nebulizer solution, Take 3 mLs (2.5 mg total) by nebulization every 6 (six) hours as needed for wheezing or shortness of breath., Disp: 150 mL, Rfl: 1 .  amiodarone (PACERONE) 200 MG tablet, Take 1 tablet (200 mg total) by mouth 2 (two) times daily., Disp: 60 tablet, Rfl: 2 .  aspirin EC 81 MG tablet, Take 81 mg by mouth daily.  , Disp: , Rfl:  .  atorvastatin (LIPITOR) 40 MG tablet, Take  1 tablet (40 mg total) by mouth daily at 6 PM., Disp: 30 tablet, Rfl: 3 .  clomiPHENE (CLOMID) 50 MG tablet, 1/4 tab daily, Disp: 10 tablet, Rfl: 5 .  clotrimazole-betamethasone (LOTRISONE) cream, Apply 1 application topically 2 (two) times daily. (Patient taking differently: Apply 1 application topically 2 (two) times daily as needed (FOR RASH). ), Disp: 30 g, Rfl: 1 .  colchicine 0.6 MG tablet, Take 1 tablet (0.6 mg total) by mouth 2 (two) times daily., Disp: 60 tablet, Rfl: 3 .  digoxin (LANOXIN) 0.125 MG tablet, Take 1 tablet (0.125 mg total) by mouth daily., Disp: 30 tablet, Rfl: 3 .  ferrous sulfate 325 (65 FE) MG tablet, Take 325 mg by mouth daily with breakfast. Reported on 03/16/2016, Disp: , Rfl:  .  furosemide (LASIX) 20 MG tablet, TAKE 1 TABLET (20 MG TOTAL) BY MOUTH DAILY., Disp: 30 tablet, Rfl: 0 .  levETIRAcetam (KEPPRA) 750 MG tablet, Take 2 tablets (1,500  mg total) by mouth 2 (two) times daily., Disp: 120 tablet, Rfl: 11 .  lisinopril (PRINIVIL,ZESTRIL) 20 MG tablet, Take 20 mg by mouth daily. , Disp: , Rfl:  .  metoprolol succinate (TOPROL-XL) 25 MG 24 hr tablet, Take 25 mg by mouth daily., Disp: , Rfl: 3 .  nitroGLYCERIN (NITROSTAT) 0.4 MG SL tablet, Place 1 tablet (0.4 mg total) under the tongue every 5 (five) minutes as needed for chest pain., Disp: 25 tablet, Rfl: 1 .  pantoprazole (PROTONIX) 40 MG tablet, Take 40 mg by mouth daily. , Disp: , Rfl:  .  sildenafil (REVATIO) 20 MG tablet, 2-5 pills as needed for ED symptoms, Disp: 50 tablet, Rfl: 10 .  tamoxifen (NOLVADEX) 10 MG tablet, Take 1 tablet (10 mg total) by mouth daily., Disp: 30 tablet, Rfl: 11 .  tamsulosin (FLOMAX) 0.4 MG CAPS capsule, Take 1 capsule (0.4 mg total) by mouth daily., Disp: 30 capsule, Rfl: 3   Review of Systems     Objective:   Physical Exam  Constitutional: He is oriented to person, place, and time. He appears well-developed and well-nourished. No distress.  HENT:  Head: Normocephalic and atraumatic.  Right Ear: External ear normal.  Left Ear: External ear normal.  Mouth/Throat: Oropharynx is clear and moist. No oropharyngeal exudate.  Eyes: Conjunctivae and EOM are normal. Pupils are equal, round, and reactive to light. Right eye exhibits no discharge. Left eye exhibits no discharge. No scleral icterus.  Neck: Normal range of motion. Neck supple. No JVD present. No tracheal deviation present. No thyromegaly present.  Cardiovascular: Normal rate, regular rhythm and intact distal pulses.  Exam reveals no gallop and no friction rub.   No murmur heard. Pulmonary/Chest: Effort normal and breath sounds normal. No respiratory distress. He has no wheezes. He has no rales. He exhibits no tenderness.  Abdominal: Soft. Bowel sounds are normal. He exhibits no distension and no mass. There is no tenderness. There is no rebound and no guarding.  Significant visible and  viseral obesity present  Musculoskeletal: Normal range of motion. He exhibits no edema or tenderness.  Lymphadenopathy:    He has no cervical adenopathy.  Neurological: He is alert and oriented to person, place, and time. He has normal reflexes. No cranial nerve deficit. Coordination normal.  Skin: Skin is warm and dry. No rash noted. He is not diaphoretic. No erythema. No pallor.  Psychiatric: He has a normal mood and affect. His behavior is normal. Judgment and thought content normal.  Nursing note and vitals reviewed.  Vitals:   01/24/17 1117  BP: 132/86  Pulse: 68  SpO2: 99%  Weight: 242 lb (109.8 kg)  Height: 5\' 7"  (1.702 m)   Estimated body mass index is 37.9 kg/m as calculated from the following:   Height as of this encounter: 5\' 7"  ([id number] m).   Weight as of this encounter: 242 lb (109.8 kg).     Assessment:       ICD-9-CM ICD-10-CM   1. Dyspnea and respiratory abnormality 786.09 R06.00     R06.89   2. Morbid obesity, unspecified obesity type ([place]) 278.01 E66.01   3. Physical deconditioning 799.3 R53.81   4. Moderate persistent asthma, uncomplicated 123456 123456   5. Diastolic dysfunction A999333 I51.9        Plan:      - Your shortness of breath is because of all of the above reasons - Currently asthma appears in remission - glad rehab has helped a lot but agree we can see if we can make further improvement  PLAN - for further improvement  - please call Dr Terrence Dupont office and ask for alternative to lisinopril  - take low carb diet sheet and eat only foods in left lane - Continue Advair for now without change  Follow-up - 8 months or sooner if needed  Dr. Brand Males, M.D., [facility]C.P Pulmonary and Critical Care Medicine Staff Physician [place] [facility] Pager: [phone], If no answer or between  15:00h - 7:00h: call [phone]  01/24/2017 11:33 AM

## 2017-01-24 NOTE — Patient Instructions (Addendum)
ICD-9-CM ICD-10-CM   1. Dyspnea and respiratory abnormality 786.09 R06.00     R06.89   2. Morbid obesity, unspecified obesity type ([place]) 278.01 E66.01   3. Physical deconditioning 799.3 R53.81   4. Moderate persistent asthma, uncomplicated 123456 123456   5. Diastolic dysfunction A999333 I51.9      - Your shortness of breath is because of all of the above reasons - Currently asthma appears in remission - glad rehab has helped a lot but agree we can see if we can make further improvement  PLAN - for further improvement  - please call Dr Terrence Dupont office and ask for alternative to lisinopril  - take low carb diet sheet and eat only foods in left lane - Continue Advair for now without change  Follow-up - 8 months or sooner if needed

## 2017-01-25 DIAGNOSIS — G4733 Obstructive sleep apnea (adult) (pediatric): Secondary | ICD-10-CM | POA: Diagnosis not present

## 2017-01-30 ENCOUNTER — Other Ambulatory Visit: Payer: Self-pay

## 2017-01-30 NOTE — Patient Outreach (Signed)
[place] [facility]) Care Management  01/30/2017  [name] [name] 04/01/1951 [id number]   Telephone call to patient for monthly call.  No answer.  HIPAA compliant voice message left.    Plan: RN Health Coach will attempt patient again in the month  February.   [name] Baseman, RN, MSN [place] [phone]

## 2017-02-07 ENCOUNTER — Telehealth: Payer: Self-pay

## 2017-02-07 NOTE — Telephone Encounter (Signed)
I called pt to discuss. 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

## 2017-02-07 NOTE — Telephone Encounter (Signed)
Pt returned my call. He says that he turned in his cpap machine because the machine did not say he was using it for 4 hours even though he was. Pt got frustrated with it and turned it in. I advised pt that Dr. Rexene Alberts does want him to seek treatment for his osa given his history. I explained to him the options for treatment like a referral to a dentist for an oral appliance or an ENT surgeon for surgical consideration. However, pt says that he will think about it and call us back with his decision. Pt says that as of now, he wants to keep his appt with Dr. Rexene Alberts on 03/22/17.

## 2017-02-07 NOTE — Telephone Encounter (Signed)
Received notice from [facility] that pt has discontinued his cpap against medical advice.

## 2017-02-07 NOTE — Telephone Encounter (Signed)
Unfortunately, patient has a prior history of noncompliance with CPAP when he was previously diagnosed with obstructive sleep apnea. Please call and advise patient that we received notice from his DME company that he returned his CPAP device. Please advise patient that from my end of things he would be strongly advised to seek treatment for his obstructive sleep apnea given his history of A. fib, heart disease, asthma. If he does not wish to use CPAP, I would be happy to refer him to a dentist for consideration of a dental appliance or ENT surgeon for surgical consideration. Let me know if he desires a referral to either.

## 2017-02-08 ENCOUNTER — Other Ambulatory Visit: Payer: Self-pay

## 2017-02-08 NOTE — Patient Outreach (Signed)
[place] [facility]) Care Management  [place]  02/08/2017   [name] [name] 1951/10/06 [id number]  Subjective: Telephone call to patient for monthly call.  Patient reports that everything is the same.  Patient reports that his weight remains stable.  Patient continues to watch his salt intake.  Reviewed with patient heart failure zones and when to notify physician.  He verbalized understanding.    Objective:   Encounter Medications:  Outpatient Encounter Prescriptions as of 02/08/2017  Medication Sig Note  . ADVAIR DISKUS 250-50 MCG/DOSE AEPB INHALE 1 PUFF INTO THE LUNGS 2 (TWO) TIMES DAILY.   Marland Kitchen albuterol (PROVENTIL HFA;VENTOLIN HFA) 108 (90 Base) MCG/ACT inhaler Inhale 2 puffs into the lungs every 6 (six) hours as needed.   Marland Kitchen albuterol (PROVENTIL) (2.5 MG/3ML) 0.083% nebulizer solution Take 3 mLs (2.5 mg total) by nebulization every 6 (six) hours as needed for wheezing or shortness of breath.   Marland Kitchen amiodarone (PACERONE) 200 MG tablet Take 1 tablet (200 mg total) by mouth 2 (two) times daily.   Marland Kitchen aspirin EC 81 MG tablet Take 81 mg by mouth daily.     Marland Kitchen atorvastatin (LIPITOR) 40 MG tablet Take 1 tablet (40 mg total) by mouth daily at 6 PM.   . clomiPHENE (CLOMID) 50 MG tablet 1/4 tab daily   . clotrimazole-betamethasone (LOTRISONE) cream Apply 1 application topically 2 (two) times daily. (Patient taking differently: Apply 1 application topically 2 (two) times daily as needed (FOR RASH). )   . colchicine 0.6 MG tablet Take 1 tablet (0.6 mg total) by mouth 2 (two) times daily.   . digoxin (LANOXIN) 0.125 MG tablet Take 1 tablet (0.125 mg total) by mouth daily.   . ferrous sulfate 325 (65 FE) MG tablet Take 325 mg by mouth daily with breakfast. Reported on 03/16/2016 02/07/2016: Will pick it up over the counter per patient.  . furosemide (LASIX) 20 MG tablet TAKE 1 TABLET (20 MG TOTAL) BY MOUTH DAILY.   Marland Kitchen levETIRAcetam (KEPPRA) 750 MG tablet Take 2 tablets (1,500 mg total) by mouth  2 (two) times daily.   Marland Kitchen lisinopril (PRINIVIL,ZESTRIL) 20 MG tablet Take 20 mg by mouth daily.    . metoprolol succinate (TOPROL-XL) 25 MG 24 hr tablet Take 25 mg by mouth daily. 12/27/2016: Received from: External Pharmacy Received Sig: TAKE 1 TABLET BY MOUTH EVERY DAY  . nitroGLYCERIN (NITROSTAT) 0.4 MG SL tablet Place 1 tablet (0.4 mg total) under the tongue every 5 (five) minutes as needed for chest pain.   . pantoprazole (PROTONIX) 40 MG tablet Take 40 mg by mouth daily.  03/08/2016: Received from: External Pharmacy  . sildenafil (REVATIO) 20 MG tablet 2-5 pills as needed for ED symptoms   . tamoxifen (NOLVADEX) 10 MG tablet Take 1 tablet (10 mg total) by mouth daily.   . tamsulosin (FLOMAX) 0.4 MG CAPS capsule Take 1 capsule (0.4 mg total) by mouth daily.    No facility-administered encounter medications on file as of 02/08/2017.     Functional Status:  In your present state of health, do you have any difficulty performing the following activities: 04/27/2016 03/23/2016  Hearing? N N  Vision? N N  Difficulty concentrating or making decisions? N N  Walking or climbing stairs? N N  Dressing or bathing? N N  Doing errands, shopping? N N  Preparing Food and eating ? N N  Using the Toilet? N N  In the past six months, have you accidently leaked urine? N N  Do  you have problems with loss of bowel control? N N  Managing your Medications? N N  Managing your Finances? N N  Housekeeping or managing your Housekeeping? N N  Some recent data might be hidden    Fall/Depression Screening: PHQ 2/9 Scores 02/08/2017 01/08/2017 12/11/2016 11/21/2016 10/04/2016 09/07/2016 08/24/2016  PHQ - 2 Score 0 0 0 0 0 0 0    Assessment: Patient continues to benefit from health coach outreach for disease management and support.   Plan:  [facility] CM Care Plan Problem One   Flowsheet Row Most Recent Value  Care Plan Problem One  Heart failure knowledge deficit  Role Documenting the Problem One  [place] for Problem One  Active  THN Long Term Goal (31-90 days)  Patient will be able to identify heart failure zones within 90 days.    THN Long Term Goal Start Date  02/08/17 [goal continued]  Interventions for Problem One Long Term Goal  RN Health Coach reiterated with patient heart failure zones and when to notify physician.       RN Health Coach will contact patient in the month of March and patient agrees to next outreach.  Jone Baseman, RN, MSN [place] [phone]

## 2017-02-09 DIAGNOSIS — J449 Chronic obstructive pulmonary disease, unspecified: Secondary | ICD-10-CM | POA: Diagnosis not present

## 2017-02-09 DIAGNOSIS — I5040 Unspecified combined systolic (congestive) and diastolic (congestive) heart failure: Secondary | ICD-10-CM | POA: Diagnosis not present

## 2017-02-09 DIAGNOSIS — G4733 Obstructive sleep apnea (adult) (pediatric): Secondary | ICD-10-CM | POA: Diagnosis not present

## 2017-02-21 DIAGNOSIS — I480 Paroxysmal atrial fibrillation: Secondary | ICD-10-CM | POA: Diagnosis not present

## 2017-02-21 DIAGNOSIS — K219 Gastro-esophageal reflux disease without esophagitis: Secondary | ICD-10-CM | POA: Diagnosis not present

## 2017-02-21 DIAGNOSIS — D649 Anemia, unspecified: Secondary | ICD-10-CM | POA: Diagnosis not present

## 2017-02-21 DIAGNOSIS — J45909 Unspecified asthma, uncomplicated: Secondary | ICD-10-CM | POA: Diagnosis not present

## 2017-02-21 DIAGNOSIS — M1990 Unspecified osteoarthritis, unspecified site: Secondary | ICD-10-CM | POA: Diagnosis not present

## 2017-02-21 DIAGNOSIS — E785 Hyperlipidemia, unspecified: Secondary | ICD-10-CM | POA: Diagnosis not present

## 2017-02-21 DIAGNOSIS — I2510 Atherosclerotic heart disease of native coronary artery without angina pectoris: Secondary | ICD-10-CM | POA: Diagnosis not present

## 2017-02-21 DIAGNOSIS — I10 Essential (primary) hypertension: Secondary | ICD-10-CM | POA: Diagnosis not present

## 2017-03-07 ENCOUNTER — Other Ambulatory Visit: Payer: Self-pay

## 2017-03-07 ENCOUNTER — Ambulatory Visit: Payer: Self-pay

## 2017-03-07 NOTE — Patient Outreach (Signed)
[facility]) Care Management  [place]  03/07/2017   [name] [name] 11-Oct-1951 [id number]  Subjective: Telephone call to patient for monthly call.  Patient reports he is doing about the same.  Patient continues to weigh daily with no changes.  Reviewed with patient heart failure and heart failure zones.  He verbalized understanding.  Discussed with patient moving calls to every other month.  Patient in agreement and states he has my number programmed in his phone.    Objective:   Encounter Medications:  Outpatient Encounter Prescriptions as of 03/07/2017  Medication Sig Note  . ADVAIR DISKUS 250-50 MCG/DOSE AEPB INHALE 1 PUFF INTO THE LUNGS 2 (TWO) TIMES DAILY.   Marland Kitchen albuterol (PROVENTIL HFA;VENTOLIN HFA) 108 (90 Base) MCG/ACT inhaler Inhale 2 puffs into the lungs every 6 (six) hours as needed.   Marland Kitchen albuterol (PROVENTIL) (2.5 MG/3ML) 0.083% nebulizer solution Take 3 mLs (2.5 mg total) by nebulization every 6 (six) hours as needed for wheezing or shortness of breath.   Marland Kitchen amiodarone (PACERONE) 200 MG tablet Take 1 tablet (200 mg total) by mouth 2 (two) times daily.   Marland Kitchen aspirin EC 81 MG tablet Take 81 mg by mouth daily.     Marland Kitchen atorvastatin (LIPITOR) 40 MG tablet Take 1 tablet (40 mg total) by mouth daily at 6 PM.   . clomiPHENE (CLOMID) 50 MG tablet 1/4 tab daily   . clotrimazole-betamethasone (LOTRISONE) cream Apply 1 application topically 2 (two) times daily. (Patient taking differently: Apply 1 application topically 2 (two) times daily as needed (FOR RASH). )   . colchicine 0.6 MG tablet Take 1 tablet (0.6 mg total) by mouth 2 (two) times daily.   . digoxin (LANOXIN) 0.125 MG tablet Take 1 tablet (0.125 mg total) by mouth daily.   . ferrous sulfate 325 (65 FE) MG tablet Take 325 mg by mouth daily with breakfast. Reported on 03/16/2016 02/07/2016: Will pick it up over the counter per patient.  . furosemide (LASIX) 20 MG tablet TAKE 1 TABLET (20 MG TOTAL) BY MOUTH DAILY.    Marland Kitchen levETIRAcetam (KEPPRA) 750 MG tablet Take 2 tablets (1,500 mg total) by mouth 2 (two) times daily.   Marland Kitchen lisinopril (PRINIVIL,ZESTRIL) 20 MG tablet Take 20 mg by mouth daily.    . metoprolol succinate (TOPROL-XL) 25 MG 24 hr tablet Take 25 mg by mouth daily. 12/27/2016: Received from: External Pharmacy Received Sig: TAKE 1 TABLET BY MOUTH EVERY DAY  . nitroGLYCERIN (NITROSTAT) 0.4 MG SL tablet Place 1 tablet (0.4 mg total) under the tongue every 5 (five) minutes as needed for chest pain.   . pantoprazole (PROTONIX) 40 MG tablet Take 40 mg by mouth daily.  03/08/2016: Received from: External Pharmacy  . sildenafil (REVATIO) 20 MG tablet 2-5 pills as needed for ED symptoms   . tamoxifen (NOLVADEX) 10 MG tablet Take 1 tablet (10 mg total) by mouth daily.   . tamsulosin (FLOMAX) 0.4 MG CAPS capsule Take 1 capsule (0.4 mg total) by mouth daily.    No facility-administered encounter medications on file as of 03/07/2017.     Functional Status:  In your present state of health, do you have any difficulty performing the following activities: 04/27/2016 03/23/2016  Hearing? N N  Vision? N N  Difficulty concentrating or making decisions? N N  Walking or climbing stairs? N N  Dressing or bathing? N N  Doing errands, shopping? N N  Preparing Food and eating ? N N  Using the Toilet? N  N  In the past six months, have you accidently leaked urine? N N  Do you have problems with loss of bowel control? N N  Managing your Medications? N N  Managing your Finances? N N  Housekeeping or managing your Housekeeping? N N  Some recent data might be hidden    Fall/Depression Screening: PHQ 2/9 Scores 03/07/2017 02/08/2017 01/08/2017 12/11/2016 11/21/2016 10/04/2016 09/07/2016  PHQ - 2 Score 0 0 0 0 0 0 0    Assessment: Patient continues to benefit from health coach outreach for disease management and support.    Plan:  [facility] CM Care Plan Problem One   Flowsheet Row Most Recent Value  Care Plan Problem One  Heart  failure knowledge deficit  Role Documenting the Problem One  [place] for Problem One  Active  [facility] Long Term Goal (31-90 days)  Patient will be able to identify heart failure zones within 90 days.    [facility] Long Term Goal Start Date  03/07/17 [goal continued]  Interventions for Problem One Long Term Goal  RN Health Coach reviewed with patient heart failure zones and when to notify physician.       RN Health Coach will contact patient in the month of May and patient agrees to next outreach.  Jone Baseman, RN, MSN [place] [phone]

## 2017-03-08 ENCOUNTER — Ambulatory Visit: Payer: Self-pay

## 2017-03-09 DIAGNOSIS — G4733 Obstructive sleep apnea (adult) (pediatric): Secondary | ICD-10-CM | POA: Diagnosis not present

## 2017-03-09 DIAGNOSIS — I5040 Unspecified combined systolic (congestive) and diastolic (congestive) heart failure: Secondary | ICD-10-CM | POA: Diagnosis not present

## 2017-03-09 DIAGNOSIS — J449 Chronic obstructive pulmonary disease, unspecified: Secondary | ICD-10-CM | POA: Diagnosis not present

## 2017-03-21 ENCOUNTER — Telehealth: Payer: Self-pay

## 2017-03-21 NOTE — Telephone Encounter (Signed)
I called patient to remind him to bring his CPAP machine in. He states that he turned it in to the DME company because it was showing that he was not using it even though he states he was. Patient could not afford CPAP without insurance. He will come in tomorrow to discuss alternatives.

## 2017-03-22 ENCOUNTER — Ambulatory Visit (INDEPENDENT_AMBULATORY_CARE_PROVIDER_SITE_OTHER): Payer: Medicare HMO | Admitting: Neurology

## 2017-03-22 ENCOUNTER — Encounter: Payer: Self-pay | Admitting: Neurology

## 2017-03-22 VITALS — BP 138/78 | HR 72 | Resp 20 | Ht 67.0 in | Wt 244.0 lb

## 2017-03-22 DIAGNOSIS — G4733 Obstructive sleep apnea (adult) (pediatric): Secondary | ICD-10-CM

## 2017-03-22 NOTE — Progress Notes (Signed)
Subjective:    Patient ID: [name] [name] is a 66 y.o. male.  HPI     Interim history:   [name] [name] is a 67 year old right-handed gentleman with an underlying medical history of hypertension, hyperlipidemia, A. fib, CHF, COPD, gout, partial seizures, and obesity, who presents for follow-up consultation of his obstructive sleep apnea, after recent sleep study testing. The patient is unaccompanied today. I first met him on 08/02/2016 at the request of Dr. Krista Blue and Cecille Rubin, at which time [name] [name] reported a prior diagnosis of OSA. I invited him for sleep study. [name] [name] had a baseline sleep study, followed by a CPAP titration study. His baseline sleep study from 09/01/2016 showed a sleep efficiency of 78.1%, sleep latency 7 minutes, REM latency prolonged at 170.5 minutes, [name] [name] had an increased percentage of stage II sleep, near absence of slow-wave sleep and 10.5% of REM sleep. Total AHI was 12.8 per hour, REM AHI was 31.8 per hour, supine AHI was 12 per hour, average oxygen saturation 98%, nadir was 82%. [name] [name] had borderline PLMS. I invited him for a CPAP titration study. [name] [name] had this on 10/06/2016. Sleep efficiency was 82.9%, latency to sleep was 21.5 minutes. [name] [name] had 59.5% of wake after sleep onset with moderate sleep fragmentation noted. [name] [name] had increased slow-wave sleep at 34.5%, REM sleep was 6%. [name] [name] was fitted with a Mirage fx nasal mask. CPAP was titrated from 5 cm to 6 cm. At that pressure of 6 cm his AHI was 0 per hour. [name] [name] had no significant PLMS post CPAP initiation. Based on his test results are prescribed CPAP therapy for home use.  Today, 03/22/2017 (all dictated new, as well as above notes, some dictation done in note pad or Word, outside of chart, may appear as copied):  I was unable to review CPAP compliance data as [name] [name] returned his CPAP machine. [name] [name] was not compliant with CPAP, per DME company, I have is not seen him since August 2017. [name] [name] reports that [name] [name] does not want to use CPAP any longer. [name] [name] did not like  using CPAP. In the past, [name] [name] also did not use CPAP for sleep apnea which was noted to be moderate at the time, our sleep study results indicated an AHI at baseline from 12.8-31.8 in rem sleep. O2 nadir was 82%. [name] [name] has very few teeth and poor dental hygiene, does not have a dentist at this time. [name] [name] would be willing to see ENT for surgical evaluation.  The patient's allergies, current medications, family history, past medical history, past social history, past surgical history and problem list were reviewed and updated as appropriate.   Previously (copied from previous notes for reference):   08/02/2016: [name] [name] was previously diagnosed with obstructive sleep apnea in the moderate degree in 2011. Study was done at [facility] on 07/29/10, which was interpreted by Dr. Annamaria Boots. [name] [name] had a split-night sleep study and I reviewed the results. Baseline AHI was 18 per hour, CPAP titrated to 13 cm with an AHI of 0 per hour. Moderate snoring was noted, O2 nadir was 91%. [name] [name] was placed on CPAP therapy at 13 cm. Patient reports that [name] [name] used CPAP for some time, maybe a few weeks, but stopped using it years ago. [name] [name] weighed 242 lb at the time.  [name] [name] reports snoring and excessive daytime somnolence. I reviewed your office note from 07/19/2016. His Epworth sleepiness score is 9 out of 24 today, his fatigue score is 42 out of 63. [name] [name] does not smoke or drink  alcohol. [name] [name] lives alone. [name] [name] has 1 grown child. [name] [name] goes to bed around 10:30 to 11 PM, and TV is on all night. Wake up time is between 5:30 and 6 AM.  [name] [name] has nocturia about 1/night, no RLS, has rare AM HAs.  [name] [name] does not drink coffee but drinks soda about 32 oz per day, and sweet tea about 32 oz per day. [name] [name] is retired, single. [name] [name] sees Dr. Glennon Mac in cardiology.   His Past Medical History Is Significant For: Past Medical History:  Diagnosis Date  . Anemia   . Asthma   . Atrial fibrillation ([place])   . CHF (congestive heart failure) ([place])   . COPD (chronic obstructive  pulmonary disease) ([place])   . Heart disease   . Hypertension     His Past Surgical History Is Significant For: Past Surgical History:  Procedure Laterality Date  . CARDIAC CATHETERIZATION N/A 12/08/2015   Procedure: Left Heart Cath and Coronary Angiography;  Surgeon: Charolette Forward, MD;  Location: [facility];  Service: Cardiovascular;  Laterality: N/A;  . ESOPHAGUS SURGERY      His Family History Is Significant For: Family History  Problem Relation Age of Onset  . Cancer Mother     His Social History Is Significant For: Social History   Social History  . Marital status: Single    Spouse name: N/A  . Number of children: 1  . Years of education: college   Occupational History  . [employer]   Social History Main Topics  . Smoking status: Never Smoker  . Smokeless tobacco: Never Used  . Alcohol use No  . Drug use: No  . Sexual activity: Not Currently   Other Topics Concern  . None   Social History Narrative   Patient lives at home alone and [name] [name] is single.   Patient is semi retired.    Education college.   Right handed.   Caffeine two cokes daily.    His Allergies Are:  No Known Allergies:   His Current Medications Are:  Outpatient Encounter Prescriptions as of 03/22/2017  Medication Sig  . ADVAIR DISKUS 250-50 MCG/DOSE AEPB INHALE 1 PUFF INTO THE LUNGS 2 (TWO) TIMES DAILY.  Marland Kitchen albuterol (PROVENTIL HFA;VENTOLIN HFA) 108 (90 Base) MCG/ACT inhaler Inhale 2 puffs into the lungs every 6 (six) hours as needed.  Marland Kitchen albuterol (PROVENTIL) (2.5 MG/3ML) 0.083% nebulizer solution Take 3 mLs (2.5 mg total) by nebulization every 6 (six) hours as needed for wheezing or shortness of breath.  Marland Kitchen amiodarone (PACERONE) 200 MG tablet Take 1 tablet (200 mg total) by mouth 2 (two) times daily.  Marland Kitchen aspirin EC 81 MG tablet Take 81 mg by mouth daily.    Marland Kitchen atorvastatin (LIPITOR) 40 MG tablet Take 1 tablet (40 mg total) by mouth daily at 6 PM.  . clomiPHENE (CLOMID) 50  MG tablet 1/4 tab daily  . clotrimazole-betamethasone (LOTRISONE) cream Apply 1 application topically 2 (two) times daily. (Patient taking differently: Apply 1 application topically 2 (two) times daily as needed (FOR RASH). )  . colchicine 0.6 MG tablet Take 1 tablet (0.6 mg total) by mouth 2 (two) times daily.  . digoxin (LANOXIN) 0.125 MG tablet Take 1 tablet (0.125 mg total) by mouth daily.  . ferrous sulfate 325 (65 FE) MG tablet Take 325 mg by mouth daily with breakfast. Reported on 03/16/2016  . furosemide (LASIX) 20 MG tablet TAKE 1 TABLET (20 MG TOTAL) BY MOUTH DAILY.  Marland Kitchen levETIRAcetam (KEPPRA)  750 MG tablet Take 2 tablets (1,500 mg total) by mouth 2 (two) times daily.  Marland Kitchen lisinopril (PRINIVIL,ZESTRIL) 20 MG tablet Take 20 mg by mouth daily.   . metoprolol succinate (TOPROL-XL) 25 MG 24 hr tablet Take 25 mg by mouth daily.  . nitroGLYCERIN (NITROSTAT) 0.4 MG SL tablet Place 1 tablet (0.4 mg total) under the tongue every 5 (five) minutes as needed for chest pain.  . pantoprazole (PROTONIX) 40 MG tablet Take 40 mg by mouth daily.   . sildenafil (REVATIO) 20 MG tablet 2-5 pills as needed for ED symptoms  . tamoxifen (NOLVADEX) 10 MG tablet Take 1 tablet (10 mg total) by mouth daily.  . tamsulosin (FLOMAX) 0.4 MG CAPS capsule Take 1 capsule (0.4 mg total) by mouth daily.   No facility-administered encounter medications on file as of 03/22/2017.   :  Review of Systems:  Out of a complete 14 point review of systems, all are reviewed and negative with the exception of these symptoms as listed below: Review of Systems  Neurological:       Patient states that [name] [name] turned in his CPAP machine. [name] [name] did not feel that it was recording the time used. Ex. [name] [name] reports that one night [name] [name] used it for 6 hours and the machine recorded 70mn. [name] [name] would like to discuss alternative treatments.     Objective:  Neurologic Exam  Physical Exam Physical Examination:   Vitals:   03/22/17 1024  BP: 138/78  Pulse:  72  Resp: 20    General Examination: The patient is a very pleasant 66y.o. male in no acute distress. [name] [name] appears well-developed and well-nourished and adequately groomed.   HEENT: Normocephalic, atraumatic, pupils are equal, round and reactive to light and accommodation. Extraocular tracking is good without limitation to gaze excursion or nystagmus noted. Normal smooth pursuit is noted. Hearing is grossly intact. Face is symmetric with normal facial animation and normal facial sensation. Speech is clear with no dysarthria noted. There is no hypophonia. There is no lip, neck/head, jaw or voice tremor. Neck is supple with full range of passive and active motion. There are no carotid bruits on auscultation. Oropharynx exam reveals: moderate mouth dryness, poor dental hygiene and nearly edentulous state, and marked airway crowding. Mallampati is class III. Tongue protrudes centrally and palate elevates symmetrically. Could not visualize tonsils.  Chest: Clear to auscultation without wheezing, rhonchi or crackles noted.  Heart: S1+S2+0, regular and normal without murmurs, rubs or gallops noted.   Abdomen: Soft, non-tender and non-distended with normal bowel sounds appreciated on auscultation.  Extremities: There is no pitting edema in the distal lower extremities bilaterally. Pedal pulses are intact.  Skin: Warm and dry without trophic changes noted.  Musculoskeletal: exam reveals no obvious joint deformities, tenderness or joint swelling or erythema.   Neurologically:  Mental status: The patient is awake, alert and oriented in all 4 spheres. His immediate and remote memory, attention, language skills and fund of knowledge are appropriate. There is no evidence of aphasia, agnosia, apraxia or anomia. Speech is clear with normal prosody and enunciation. Thought process is linear. Mood is normal and affect is normal.  Cranial nerves II - XII are as described above under HEENT exam. In addition:  shoulder shrug is normal with equal shoulder height noted. Motor exam: Normal bulk, strength and tone is noted. There is no drift, tremor or rebound. Fine motor skills and coordination: intact.  Cerebellar testing: No dysmetri with OFC oh a or intention tremor on finger  to nose testing. Heel to shin is unremarkable bilaterally. There is no truncal or gait ataxia.  Sensory exam: intact to light touch in the upper and lower extremities.  Gait, station and balance: [name] [name] stands easily. No veering to one side is noted. No leaning to one side is noted. Posture is age-appropriate and stance is narrow based. Gait shows normal stride length and normal pace. No problems turning are noted.            Assessment and Plan:   In summary, ORIEL RUMBOLD is a very pleasant 66 y.o.-year old male with an underlying medical history of hypertension, hyperlipidemia, A. fib, CHF, COPD, gout, partial seizures, and obesity, who was previously diagnosed with moderate obstructive sleep apnea in 2011. We repeated baseline sleep study testing in September 2017 which showed mild to severe obstructive sleep apnea, O2 nadir of 82%. [name] [name] return for a full night CPAP titration study and was prescribed CPAP therapy but returned his CPAP machine. [name] [name] does not wish to be on CPAP therapy or try it again. [name] [name] does not appear to be a candidate for a dental appliance. [name] [name] would be willing to see ENT for surgical evaluation. I made a referral in that regard. I strongly advised him to strive for weight loss. Physical exam is stable otherwise. I placed a referral to ENT today. [name] [name] is advised that their office will call him directly, if [name] [name] does not hear back in the next week or 2 from their office [name] [name] is advised to call us back. [name] [name] is advised to keep his appointment with Dr. Krista Blue for June 2018. I answered all his questions today and [name] [name] was in agreement. I spent 15 minutes in total face-to-face time with the patient, more than 50% of which was spent in  counseling and coordination of care, reviewing test results, reviewing medication and discussing or reviewing the diagnosis of OSA, its prognosis and treatment options. Pertinent laboratory and imaging test results that were available during this visit with the patient were reviewed by me and considered in my medical decision making (see chart for details).

## 2017-03-22 NOTE — Patient Instructions (Signed)
I will make a referral to ENT to see if you qualify for sleep apnea treatment with surgical intervention.  Please work hard on weight loss.  I will see you back if needed.

## 2017-04-06 ENCOUNTER — Telehealth: Payer: Self-pay | Admitting: Neurology

## 2017-04-06 NOTE — Telephone Encounter (Signed)
LVM advising patient that this RN was seeking further information. Advised him that Hoyle Sauer recommends he may need to go to ED for evaluation. Advised if he is experiencing or does experience again any of the symptoms he described or similar symptoms he must go directly to the ED to be evaluated. Reemphasized the instructions and advice. Advised this office is now closed; however there is a dr on call over the weekend.

## 2017-04-06 NOTE — Telephone Encounter (Addendum)
Last night the patient's hand felt twingy, he stood up, felt drunk, took 2 steps and could not remember anything after that and came too 15-20 minutes later. Not sure if he just fell asleep. He still feels strange.

## 2017-04-06 NOTE — Telephone Encounter (Signed)
[name] Cl aire please get more information, He may need to go to the ER

## 2017-04-06 NOTE — Telephone Encounter (Signed)
Please look into this: Your patient with Sz d/o.

## 2017-04-09 DIAGNOSIS — G4733 Obstructive sleep apnea (adult) (pediatric): Secondary | ICD-10-CM | POA: Diagnosis not present

## 2017-04-09 DIAGNOSIS — J449 Chronic obstructive pulmonary disease, unspecified: Secondary | ICD-10-CM | POA: Diagnosis not present

## 2017-04-09 DIAGNOSIS — I5040 Unspecified combined systolic (congestive) and diastolic (congestive) heart failure: Secondary | ICD-10-CM | POA: Diagnosis not present

## 2017-04-10 ENCOUNTER — Other Ambulatory Visit: Payer: Self-pay

## 2017-04-10 ENCOUNTER — Encounter: Payer: Self-pay | Admitting: Family Medicine

## 2017-04-10 ENCOUNTER — Ambulatory Visit: Payer: Medicare HMO | Attending: Family Medicine | Admitting: Family Medicine

## 2017-04-10 VITALS — BP 146/84 | HR 55 | Temp 97.7°F | Resp 18 | Ht 67.0 in | Wt 239.0 lb

## 2017-04-10 DIAGNOSIS — Z7982 Long term (current) use of aspirin: Secondary | ICD-10-CM | POA: Insufficient documentation

## 2017-04-10 DIAGNOSIS — I2510 Atherosclerotic heart disease of native coronary artery without angina pectoris: Secondary | ICD-10-CM | POA: Diagnosis not present

## 2017-04-10 DIAGNOSIS — I480 Paroxysmal atrial fibrillation: Secondary | ICD-10-CM | POA: Diagnosis not present

## 2017-04-10 DIAGNOSIS — I5032 Chronic diastolic (congestive) heart failure: Secondary | ICD-10-CM | POA: Diagnosis not present

## 2017-04-10 DIAGNOSIS — Z79899 Other long term (current) drug therapy: Secondary | ICD-10-CM | POA: Insufficient documentation

## 2017-04-10 DIAGNOSIS — R55 Syncope and collapse: Secondary | ICD-10-CM

## 2017-04-10 DIAGNOSIS — K219 Gastro-esophageal reflux disease without esophagitis: Secondary | ICD-10-CM | POA: Diagnosis not present

## 2017-04-10 DIAGNOSIS — Z8719 Personal history of other diseases of the digestive system: Secondary | ICD-10-CM

## 2017-04-10 DIAGNOSIS — G4089 Other seizures: Secondary | ICD-10-CM | POA: Insufficient documentation

## 2017-04-10 DIAGNOSIS — I495 Sick sinus syndrome: Secondary | ICD-10-CM

## 2017-04-10 DIAGNOSIS — N400 Enlarged prostate without lower urinary tract symptoms: Secondary | ICD-10-CM | POA: Diagnosis not present

## 2017-04-10 DIAGNOSIS — I4891 Unspecified atrial fibrillation: Secondary | ICD-10-CM

## 2017-04-10 DIAGNOSIS — J438 Other emphysema: Secondary | ICD-10-CM | POA: Diagnosis not present

## 2017-04-10 DIAGNOSIS — I110 Hypertensive heart disease with heart failure: Secondary | ICD-10-CM | POA: Diagnosis not present

## 2017-04-10 DIAGNOSIS — I10 Essential (primary) hypertension: Secondary | ICD-10-CM

## 2017-04-10 DIAGNOSIS — R569 Unspecified convulsions: Secondary | ICD-10-CM

## 2017-04-10 NOTE — Progress Notes (Signed)
Patient is here for HTN FU  Patient denies pain at this time.  Patient complains of a syncope episode occurring last Thursday. Patient ate a pork microwave dinner and when he stood up he felt "funny". Patient attempted to make it to the bathroom and states he woke up inside of a laundry whicker basket located on the floor. Patient states he judged the amount of time he was unconscious to be 15-20 minutes, based on the television show he was watching prior to falling out and then coming to.  Patient has not taken medication today. Patient has not eaten today.

## 2017-04-10 NOTE — Progress Notes (Signed)
Subjective:  Patient ID: [name] [name], male    DOB: 04-29-1951  Age: 66 y.o. MRN: [id number]  CC: Hypertension and Asthma   HPI [name] [name] is a 66 year old male with a history of paroxysmal A. fib, atrial flutter, diastolic congestive heart failure (EF 50-55% in 11/2015), CAD (status post cardiac cath in 11/2015),  COPD, GERD, seizures, BPH Here for a follow-up visit.  He was recently seen by neurology for seizures and sleep apnea and also by his pulmonologist. He reports control of his COPD and denies cough, wheezing or chest pains.  He complains that 5 days ago he had an episode after lunch after getting up where he "felt out there" and was unsure of how long that occurred until he woke up finding himself in his hamper after about 20-30 minutes which was gauged by the television show he was watching. Unsure if he fell and hit his head, denies chest pain at the time. He has not seen his cardiologist recently. Review of his chart indicate a history of sick sinus syndrome.  , Past Medical History:  Diagnosis Date  . Anemia   . Asthma   . Atrial fibrillation ([place])   . CHF (congestive heart failure) ([place])   . COPD (chronic obstructive pulmonary disease) ([place])   . Heart disease   . Hypertension     Past Surgical History:  Procedure Laterality Date  . CARDIAC CATHETERIZATION N/A 12/08/2015   Procedure: Left Heart Cath and Coronary Angiography;  Surgeon: Charolette Forward, MD;  Location: [facility];  Service: Cardiovascular;  Laterality: N/A;  . ESOPHAGUS SURGERY      No Known Allergies   Outpatient Medications Prior to Visit  Medication Sig Dispense Refill  . ADVAIR DISKUS 250-50 MCG/DOSE AEPB INHALE 1 PUFF INTO THE LUNGS 2 (TWO) TIMES DAILY. 180 each 0  . albuterol (PROVENTIL HFA;VENTOLIN HFA) 108 (90 Base) MCG/ACT inhaler Inhale 2 puffs into the lungs every 6 (six) hours as needed. 1 Inhaler 5  . albuterol (PROVENTIL) (2.5 MG/3ML) 0.083% nebulizer solution Take 3 mLs  (2.5 mg total) by nebulization every 6 (six) hours as needed for wheezing or shortness of breath. 150 mL 1  . amiodarone (PACERONE) 200 MG tablet Take 1 tablet (200 mg total) by mouth 2 (two) times daily. 60 tablet 2  . aspirin EC 81 MG tablet Take 81 mg by mouth daily.      Marland Kitchen atorvastatin (LIPITOR) 40 MG tablet Take 1 tablet (40 mg total) by mouth daily at 6 PM. 30 tablet 3  . clomiPHENE (CLOMID) 50 MG tablet 1/4 tab daily 10 tablet 5  . clotrimazole-betamethasone (LOTRISONE) cream Apply 1 application topically 2 (two) times daily. (Patient taking differently: Apply 1 application topically 2 (two) times daily as needed (FOR RASH). ) 30 g 1  . colchicine 0.6 MG tablet Take 1 tablet (0.6 mg total) by mouth 2 (two) times daily. 60 tablet 3  . digoxin (LANOXIN) 0.125 MG tablet Take 1 tablet (0.125 mg total) by mouth daily. 30 tablet 3  . ferrous sulfate 325 (65 FE) MG tablet Take 325 mg by mouth daily with breakfast. Reported on 03/16/2016    . furosemide (LASIX) 20 MG tablet TAKE 1 TABLET (20 MG TOTAL) BY MOUTH DAILY. 30 tablet 0  . levETIRAcetam (KEPPRA) 750 MG tablet Take 2 tablets (1,500 mg total) by mouth 2 (two) times daily. 120 tablet 11  . lisinopril (PRINIVIL,ZESTRIL) 20 MG tablet Take 20 mg by mouth daily.     Marland Kitchen  metoprolol succinate (TOPROL-XL) 25 MG 24 hr tablet Take 25 mg by mouth daily.  3  . nitroGLYCERIN (NITROSTAT) 0.4 MG SL tablet Place 1 tablet (0.4 mg total) under the tongue every 5 (five) minutes as needed for chest pain. 25 tablet 1  . pantoprazole (PROTONIX) 40 MG tablet Take 40 mg by mouth daily.     . sildenafil (REVATIO) 20 MG tablet 2-5 pills as needed for ED symptoms 50 tablet 10  . tamoxifen (NOLVADEX) 10 MG tablet Take 1 tablet (10 mg total) by mouth daily. 30 tablet 11  . tamsulosin (FLOMAX) 0.4 MG CAPS capsule Take 1 capsule (0.4 mg total) by mouth daily. 30 capsule 3   No facility-administered medications prior to visit.     ROS Review of Systems  Constitutional:  Negative for activity change and appetite change.  HENT: Negative for sinus pressure and sore throat.   Eyes: Negative for visual disturbance.  Respiratory: Positive for shortness of breath. Negative for cough and chest tightness.   Cardiovascular: Negative for chest pain and leg swelling.  Gastrointestinal: Negative for abdominal distention, abdominal pain, constipation and diarrhea.  Endocrine: Negative.   Genitourinary: Negative for dysuria.  Musculoskeletal: Negative for joint swelling and myalgias.  Skin: Negative for rash.  Allergic/Immunologic: Negative.   Neurological: Negative for weakness, light-headedness and numbness.  Psychiatric/Behavioral: Negative for dysphoric mood and suicidal ideas.    Objective:  BP (!) 146/84 (BP Location: Right Arm, Patient Position: Sitting, Cuff Size: Large)   Pulse (!) 55   Temp 97.7 F (36.5 C) (Oral)   Resp 18   Ht '5\' 7"'  (1.702 m)   Wt 239 lb (108.4 kg)   SpO2 100%   BMI 37.43 kg/m   BP/Weight 04/10/2017 03/22/2017 2/70/6237  Systolic BP 628 315 176  Diastolic BP 84 78 86  Wt. (Lbs) 239 244 242  BMI 37.43 38.22 37.9      Physical Exam  Constitutional: He is oriented to person, place, and time. He appears well-developed and well-nourished.  Cardiovascular: Normal heart sounds and intact distal pulses.  Bradycardia present.   No murmur heard. Pulmonary/Chest: Effort normal and breath sounds normal. He has no wheezes. He has no rales. He exhibits no tenderness.  Abdominal: Soft. Bowel sounds are normal. He exhibits no distension and no mass. There is no tenderness.  Musculoskeletal: Normal range of motion.  Neurological: He is alert and oriented to person, place, and time.  Skin: Skin is warm and dry.  Psychiatric: He has a normal mood and affect.     Assessment & Plan:   1. Sick sinus syndrome (HCC) EKG reveals sinus bradycardia, inferior infarct which is new compared to previous EKGs Patient needs to see his cardiologist,Dr  Terrence Dupont - may need cardiac cath He will be calling for an appointment. - CT Head Wo Contrast; Future - US Carotid Duplex Bilateral; Future  2. History of gastroesophageal reflux (GERD) Stable Continue PPI  3. Other emphysema ([place]) Controlled Continue inhalers  4. Benign prostatic hyperplasia without lower urinary tract symptoms Controlled Continue Flomax  5. Atrial fibrillation, unspecified type ([place]) Rate control with metoprolol Currently on Amiodarone  6. Essential hypertension Slightly elevated above goal of less than 130/80 Low-sodium diet - CMP14+EGFR - Lipid panel  7. Seizures ([place]) Continue Keppra Keep appointment with neurology  8. Chronic diastolic congestive heart failure (HCC) EF 50-55% No acute exacerbation - Digoxin level  9. Syncope, unspecified syncope type Workup ongoing Advised to present to the ED if he has a  repeat   No orders of the defined types were placed in this encounter.   Follow-up: Return in about 1 month (around 05/10/2017) for Follow-up on syncope.   Arnoldo Morale MD

## 2017-04-10 NOTE — Patient Instructions (Signed)
Sick Sinus Syndrome Sick sinus syndrome is a group of conditions that affect heart rhythm and how quickly the heart beats (heart rate). When you have sick sinus syndrome, your heart rate may be too fast (tachycardia) or too slow (bradycardia), or it may switch between fast and slow. What are the causes? Your heartbeat is controlled by a structure in the heart called the sinoatrial (SA) node. Sick sinus syndrome happens when the SA node does not work properly (SA node dysfunction). It is not known what causes this. What increases the risk? This syndrome is more likely to develop in people who:  Are age 65 or older.  Have a family history of the syndrome.  Have had a heart attack or heart surgery.  Have sleep apnea.  Have coronary artery disease.  Have inflammation of the heart muscle (myocarditis) or the sac that surrounds the heart (pericarditis).  Use medicines such as beta blockers, some calcium channel blockers, or digoxin.  Have an abnormal level of thyroid hormone or electrolytes.  Have high blood pressure (hypertension).  Have had infections that affect the heart, like rheumatic fever or diphtheria.  Are born with heart defects (congenital heart disease).  What are the signs or symptoms? Symptom of this syndrome include:  Fainting or feeling like you are going to faint.  Chest pain.  Shortness of breath.  Dizziness.  Feeling like your heart skips beats.  Feeling like your heart beats very quickly.  Tiredness.  Fatigue.  Confusion.  Some people with this condition do not have any symptoms. Most people have few symptoms or very mild symptoms. How is this diagnosed? This syndrome may be diagnosed with tests, such as:  A test that measures electrical activity in the heart (electrocardiogram, or ECG).  A test in which you wear a device called a Holter monitor for 1-2 days. The device will record your heart's electrical signals.  Placing a device to record  the electrical activity of your heart over a longer period of time (implantable loop recorder).  An test to look at the electrical system of your heart (electrophysiology study).  Blood tests.  How is this treated? Treatment for this syndrome may include:  Surgery to put a small, electrical device in your chest to correct your heartbeat (pacemaker).  Medicines to keep your heart from beating too quickly.  Stopping taking certain medicines as told by your health care provider.  Treatment of the underlying condition.  If the syndrome is not causing any symptoms, you may not need treatment. Follow these instructions at home:  Take over-the-counter and prescription medicines only as told by your health care provider.  Stay active and stay at a healthy weight. Ask your health care provider what level of activity is safe for you.  Eat a heart-healthy diet that includes fruits, vegetables, whole grains, low-fat dairy products, and lean proteins like poultry and eggs. Ask your health care provider if you should: ? Avoid any foods or drinks. ? Limit your salt (sodium) intake.  Limit alcohol intake to no more than 1 drink a day for nonpregnant women and 2 drinks a day for men. One drink equals 12 oz of beer, 5 oz of wine, or 1 oz of hard liquor.  Do not use any products that contain nicotine or tobacco, such as cigarettes and e-cigarettes. If you need help quitting, ask your health care provider.  Keep all follow-up visits as told by your health care provider. This is important. Contact a health care provider   if:  You have a cough that does not go away.  You have swelling in your feet or ankles.  You feel short of breath with activity.  You feel fatigued. Get help right away if:  You have chest pain or difficulty breathing.  You suddenly have weakness, numbness, or loss of movement on one side of your body.  You suddenly become very confused.  You suddenly lose the ability to  speak or understand speech.  You feel like your heart is skipping beats.  You feel like your heart is beating very quickly.  You faint or feel like you are going to faint. These symptoms may represent a serious problem that is an emergency. Do not wait to see if the symptoms will go away. Get medical help right away. Call your local emergency services (911 in the [place] Do not drive yourself to the hospital. Summary  When you have sick sinus syndrome, your heart rate may be too fast (tachycardia) or too slow (bradycardia), or it may switch between fast and slow.  Treatment for this syndrome may include taking medicines and having a pacemaker placed in your chest.  If you have chest pain or difficulty breathing, get help right away. Do not drive yourself to the hospital. This information is not intended to replace advice given to you by your health care provider. Make sure you discuss any questions you have with your health care provider. Document Released: 11/29/2009 Document Revised: 08/17/2016 Document Reviewed: 08/08/2016 Elsevier Interactive Patient Education  2017 Elsevier Inc.  

## 2017-04-11 ENCOUNTER — Ambulatory Visit (HOSPITAL_COMMUNITY)
Admission: RE | Admit: 2017-04-11 | Discharge: 2017-04-11 | Disposition: A | Discharge status: Home / Self Care | Payer: Medicare HMO | Source: Ambulatory Visit | Attending: Family Medicine | Admitting: Family Medicine

## 2017-04-11 DIAGNOSIS — R55 Syncope and collapse: Secondary | ICD-10-CM | POA: Insufficient documentation

## 2017-04-11 LAB — CMP14+EGFR
A/G RATIO: 0.9 — AB (ref 1.2–2.2)
ALBUMIN: 3.8 g/dL (ref 3.6–4.8)
ALT: 12 IU/L (ref 0–44)
AST: 20 IU/L (ref 0–40)
Alkaline Phosphatase: 94 IU/L (ref 39–117)
BUN / CREAT RATIO: 8 — AB (ref 10–24)
BUN: 9 mg/dL (ref 8–27)
Bilirubin Total: 0.4 mg/dL (ref 0.0–1.2)
CALCIUM: 9.4 mg/dL (ref 8.6–10.2)
CO2: 24 mmol/L (ref 18–29)
CREATININE: 1.08 mg/dL (ref 0.76–1.27)
Chloride: 101 mmol/L (ref 96–106)
GFR calc Af Amer: 81 mL/min/{1.73_m2} (ref >59)
GFR, EST NON AFRICAN AMERICAN: 70 mL/min/{1.73_m2} (ref >59)
GLOBULIN, TOTAL: 4.4 g/dL (ref 1.5–4.5)
Glucose: 78 mg/dL (ref 65–99)
POTASSIUM: 4 mmol/L (ref 3.5–5.2)
SODIUM: 143 mmol/L (ref 134–144)
Total Protein: 8.2 g/dL (ref 6.0–8.5)

## 2017-04-11 LAB — VAS US CAROTID
LCCAPSYS: 85 cm/s
LEFT ECA DIAS: -9 cm/s
LEFT VERTEBRAL DIAS: -10 cm/s
LICADDIAS: -23 cm/s
Left CCA dist dias: -26 cm/s
Left CCA dist sys: -99 cm/s
Left CCA prox dias: 18 cm/s
Left ICA dist sys: -64 cm/s
Left ICA prox dias: -19 cm/s
Left ICA prox sys: -109 cm/s
RCCADSYS: -56 cm/s
RIGHT ECA DIAS: -8 cm/s
RIGHT VERTEBRAL DIAS: -11 cm/s
Right CCA prox dias: -12 cm/s
Right CCA prox sys: -60 cm/s

## 2017-04-11 LAB — LIPID PANEL
CHOL/HDL RATIO: 3.9 ratio (ref 0.0–5.0)
Cholesterol, Total: 159 mg/dL (ref 100–199)
HDL: 41 mg/dL (ref >39)
LDL CALC: 95 mg/dL (ref 0–99)
Triglycerides: 116 mg/dL (ref 0–149)
VLDL Cholesterol Cal: 23 mg/dL (ref 5–40)

## 2017-04-11 LAB — DIGOXIN LEVEL

## 2017-04-11 NOTE — Progress Notes (Signed)
VASCULAR LAB PRELIMINARY  PRELIMINARY  PRELIMINARY  PRELIMINARY  Carotid duplex completed.    Preliminary report:  Bilateral - No evidence of significant extracranial ICA stenosis. Vertebral artery flow is antegrade.  Euriah Matlack, RVS 04/11/2017, 11:01 AM

## 2017-04-13 DIAGNOSIS — D649 Anemia, unspecified: Secondary | ICD-10-CM | POA: Diagnosis not present

## 2017-04-13 DIAGNOSIS — I10 Essential (primary) hypertension: Secondary | ICD-10-CM | POA: Diagnosis not present

## 2017-04-13 DIAGNOSIS — M1990 Unspecified osteoarthritis, unspecified site: Secondary | ICD-10-CM | POA: Diagnosis not present

## 2017-04-13 DIAGNOSIS — J45909 Unspecified asthma, uncomplicated: Secondary | ICD-10-CM | POA: Diagnosis not present

## 2017-04-13 DIAGNOSIS — E785 Hyperlipidemia, unspecified: Secondary | ICD-10-CM | POA: Diagnosis not present

## 2017-04-13 DIAGNOSIS — I2510 Atherosclerotic heart disease of native coronary artery without angina pectoris: Secondary | ICD-10-CM | POA: Diagnosis not present

## 2017-04-13 DIAGNOSIS — K219 Gastro-esophageal reflux disease without esophagitis: Secondary | ICD-10-CM | POA: Diagnosis not present

## 2017-04-17 ENCOUNTER — Ambulatory Visit (HOSPITAL_COMMUNITY)
Admission: RE | Admit: 2017-04-17 | Discharge: 2017-04-17 | Disposition: A | Discharge status: Home / Self Care | Payer: Medicare HMO | Source: Ambulatory Visit | Attending: Family Medicine | Admitting: Family Medicine

## 2017-04-17 DIAGNOSIS — I495 Sick sinus syndrome: Secondary | ICD-10-CM | POA: Insufficient documentation

## 2017-04-17 DIAGNOSIS — R55 Syncope and collapse: Secondary | ICD-10-CM | POA: Diagnosis not present

## 2017-04-19 DIAGNOSIS — G4733 Obstructive sleep apnea (adult) (pediatric): Secondary | ICD-10-CM | POA: Diagnosis not present

## 2017-04-25 ENCOUNTER — Telehealth: Payer: Self-pay

## 2017-04-25 NOTE — Telephone Encounter (Signed)
Writer called patient with CT of the head results.  Patient stated understanding and also states he has not fainted since the first time.  Patient to f/u with MD on 05/17/17.

## 2017-04-25 NOTE — Telephone Encounter (Signed)
-----   Message from Arnoldo Morale, MD sent at 04/17/2017  4:11 PM EDT ----- CT head reveals no acute abnormality.

## 2017-05-03 ENCOUNTER — Other Ambulatory Visit: Payer: Self-pay

## 2017-05-03 NOTE — Patient Outreach (Signed)
[facility]) Care Management  [place]  05/03/2017   [name] [name] October 21, 1951 [id number]  Subjective: Telephone call to patient for every other month call.  Patient reports he had another episode of passing out that it took him about 20 minutes to come to.  Patient reports that he did not seek medical treatment until the next day. Advised patient on the importance of seeking help after episode like that.  He verbalized understanding.  Patient reports he has been seen by the neurologist and had an MRI but it was negative.  He states he has a follow up in a couple of weeks.  Asked patient if he had seen his heart doctor.  He stated he had but they ran test and found nothing.  Patient states he is afraid to go anywhere this summer as he does not want to be away from home.  Patient reports he has had no problems with his heart failure and his weight is stable.  Advised patient to continue his current regimen with his heart failure.  He verbalized understanding.  Advised patient that would call him next month for follow up.  He agrees and verbalized understanding.    Objective:   Encounter Medications:  Outpatient Encounter Prescriptions as of 05/03/2017  Medication Sig Note  . ADVAIR DISKUS 250-50 MCG/DOSE AEPB INHALE 1 PUFF INTO THE LUNGS 2 (TWO) TIMES DAILY.   Marland Kitchen albuterol (PROVENTIL HFA;VENTOLIN HFA) 108 (90 Base) MCG/ACT inhaler Inhale 2 puffs into the lungs every 6 (six) hours as needed.   Marland Kitchen albuterol (PROVENTIL) (2.5 MG/3ML) 0.083% nebulizer solution Take 3 mLs (2.5 mg total) by nebulization every 6 (six) hours as needed for wheezing or shortness of breath.   Marland Kitchen amiodarone (PACERONE) 200 MG tablet Take 1 tablet (200 mg total) by mouth 2 (two) times daily.   Marland Kitchen aspirin EC 81 MG tablet Take 81 mg by mouth daily.     Marland Kitchen atorvastatin (LIPITOR) 40 MG tablet Take 1 tablet (40 mg total) by mouth daily at 6 PM.   . clomiPHENE (CLOMID) 50 MG tablet 1/4 tab daily   .  clotrimazole-betamethasone (LOTRISONE) cream Apply 1 application topically 2 (two) times daily. (Patient taking differently: Apply 1 application topically 2 (two) times daily as needed (FOR RASH). )   . colchicine 0.6 MG tablet Take 1 tablet (0.6 mg total) by mouth 2 (two) times daily.   . digoxin (LANOXIN) 0.125 MG tablet Take 1 tablet (0.125 mg total) by mouth daily.   . ferrous sulfate 325 (65 FE) MG tablet Take 325 mg by mouth daily with breakfast. Reported on 03/16/2016 02/07/2016: Will pick it up over the counter per patient.  . furosemide (LASIX) 20 MG tablet TAKE 1 TABLET (20 MG TOTAL) BY MOUTH DAILY.   Marland Kitchen levETIRAcetam (KEPPRA) 750 MG tablet Take 2 tablets (1,500 mg total) by mouth 2 (two) times daily.   Marland Kitchen lisinopril (PRINIVIL,ZESTRIL) 20 MG tablet Take 20 mg by mouth daily.    . metoprolol succinate (TOPROL-XL) 25 MG 24 hr tablet Take 25 mg by mouth daily. 12/27/2016: Received from: External Pharmacy Received Sig: TAKE 1 TABLET BY MOUTH EVERY DAY  . nitroGLYCERIN (NITROSTAT) 0.4 MG SL tablet Place 1 tablet (0.4 mg total) under the tongue every 5 (five) minutes as needed for chest pain.   . pantoprazole (PROTONIX) 40 MG tablet Take 40 mg by mouth daily.  03/08/2016: Received from: External Pharmacy  . sildenafil (REVATIO) 20 MG tablet 2-5 pills as needed for  ED symptoms   . tamoxifen (NOLVADEX) 10 MG tablet Take 1 tablet (10 mg total) by mouth daily.   . tamsulosin (FLOMAX) 0.4 MG CAPS capsule Take 1 capsule (0.4 mg total) by mouth daily.    No facility-administered encounter medications on file as of 05/03/2017.     Functional Status:  No flowsheet data found.  Fall/Depression Screening: Fall Risk  05/03/2017 03/07/2017 02/08/2017  Falls in the past year? No No No  Risk for fall due to : - - -   PHQ 2/9 Scores 05/03/2017 03/07/2017 02/08/2017 01/08/2017 12/11/2016 11/21/2016 10/04/2016  PHQ - 2 Score 0 0 0 0 0 0 0    Assessment: Patient continues to benefit from health coach outreach for  disease management and support.    Plan:  [facility] CM Care Plan Problem One     Most Recent Value  Care Plan Problem One  Heart failure knowledge deficit  Role Documenting the Problem One  [place] for Problem One  Active  [facility] Long Term Goal (31-90 days)  Patient will be able to identify heart failure zones within 90 days.    [facility] Long Term Goal Start Date  05/03/17 [goal continued]  Interventions for Problem One Long Term Goal  RN Health Coach discussed with patient heart failure zones and when to notify physician.       RN Health Coach will contact patient in the month of June and patient agrees to next outreach.  Jone Baseman, RN, MSN [place] [phone]

## 2017-05-17 ENCOUNTER — Encounter: Payer: Self-pay | Admitting: Family Medicine

## 2017-05-17 ENCOUNTER — Ambulatory Visit: Payer: Medicare HMO | Attending: Family Medicine | Admitting: Family Medicine

## 2017-05-17 VITALS — BP 121/73 | HR 62 | Temp 97.9°F | Resp 18 | Ht 67.0 in | Wt 237.0 lb

## 2017-05-17 DIAGNOSIS — R569 Unspecified convulsions: Secondary | ICD-10-CM | POA: Diagnosis not present

## 2017-05-17 DIAGNOSIS — I110 Hypertensive heart disease with heart failure: Secondary | ICD-10-CM | POA: Insufficient documentation

## 2017-05-17 DIAGNOSIS — R55 Syncope and collapse: Secondary | ICD-10-CM | POA: Diagnosis not present

## 2017-05-17 DIAGNOSIS — Z8719 Personal history of other diseases of the digestive system: Secondary | ICD-10-CM | POA: Diagnosis not present

## 2017-05-17 DIAGNOSIS — Z79899 Other long term (current) drug therapy: Secondary | ICD-10-CM | POA: Diagnosis not present

## 2017-05-17 DIAGNOSIS — I495 Sick sinus syndrome: Secondary | ICD-10-CM | POA: Diagnosis not present

## 2017-05-17 DIAGNOSIS — J438 Other emphysema: Secondary | ICD-10-CM | POA: Insufficient documentation

## 2017-05-17 DIAGNOSIS — N400 Enlarged prostate without lower urinary tract symptoms: Secondary | ICD-10-CM | POA: Diagnosis not present

## 2017-05-17 DIAGNOSIS — I5032 Chronic diastolic (congestive) heart failure: Secondary | ICD-10-CM | POA: Diagnosis not present

## 2017-05-17 DIAGNOSIS — I480 Paroxysmal atrial fibrillation: Secondary | ICD-10-CM | POA: Diagnosis not present

## 2017-05-17 DIAGNOSIS — I10 Essential (primary) hypertension: Secondary | ICD-10-CM | POA: Diagnosis not present

## 2017-05-17 DIAGNOSIS — J449 Chronic obstructive pulmonary disease, unspecified: Secondary | ICD-10-CM | POA: Diagnosis not present

## 2017-05-17 DIAGNOSIS — K219 Gastro-esophageal reflux disease without esophagitis: Secondary | ICD-10-CM | POA: Diagnosis not present

## 2017-05-17 DIAGNOSIS — I4891 Unspecified atrial fibrillation: Secondary | ICD-10-CM | POA: Diagnosis not present

## 2017-05-17 NOTE — Progress Notes (Signed)
Subjective:    Patient ID: [name] [name], male    DOB: 05-Dec-1951, 66 y.o.   MRN: [id number]  HPI is a 66 year old male with a history of paroxysmal A. fib, atrial flutter, diastolic congestive heart failure (EF 50-55% in 11/2015), CAD (status post cardiac cath in 11/2015),  COPD, GERD, seizures (managed by neurology), BPH Here for a follow-up of syncope.  He had a CT head and carotid Dopplers which were negative for acute intracranial abnormality and cardiac stenosis. EKG had revealed sinus bradycardia and new inferior infarct for which he had been advised to follow up with his cardiologist - Dr. Terrence Dupont  He had complained at his last visit that he had an episode after lunch after getting up where he "felt out there" and was unsure of how long that occurred until he woke up finding himself in his hamper after about 20-30 minutes which was gauged by the television show he was watching. Unsure if he fell and hit his head, denied chest pain at the time. He has not had any repeat episodes since his last visit.  Review of his chart indicate a history of sick sinus syndrome and has had similar symptoms in the past.  Past Medical History:  Diagnosis Date  . Anemia   . Asthma   . Atrial fibrillation ([place])   . CHF (congestive heart failure) ([place])   . COPD (chronic obstructive pulmonary disease) ([place])   . Heart disease   . Hypertension     Past Surgical History:  Procedure Laterality Date  . CARDIAC CATHETERIZATION N/A 12/08/2015   Procedure: Left Heart Cath and Coronary Angiography;  Surgeon: Charolette Forward, MD;  Location: [facility];  Service: Cardiovascular;  Laterality: N/A;  . ESOPHAGUS SURGERY      No Known Allergies   Review of Systems  Constitutional: Negative for activity change and appetite change.  HENT: Negative for sinus pressure and sore throat.   Eyes: Negative for visual disturbance.  Respiratory: Negative for cough, chest tightness and shortness of breath.     Cardiovascular: Negative for chest pain and leg swelling.  Gastrointestinal: Negative for abdominal distention, abdominal pain, constipation and diarrhea.  Endocrine: Negative.   Genitourinary: Negative for dysuria.  Musculoskeletal: Negative for joint swelling and myalgias.  Skin: Negative for rash.  Allergic/Immunologic: Negative.   Neurological: Negative for weakness, light-headedness and numbness.  Psychiatric/Behavioral: Negative for dysphoric mood and suicidal ideas.       Objective: Vitals:   05/17/17 1030  BP: 121/73  Pulse: 62  Resp: 18  Temp: 97.9 F (36.6 C)  TempSrc: Oral  SpO2: 100%  Weight: 237 lb (107.5 kg)  Height: 5\' 7"  (1.702 m)      Physical Exam  Constitutional: He is oriented to person, place, and time. He appears well-developed and well-nourished.  Cardiovascular: Normal rate, normal heart sounds and intact distal pulses.   No murmur heard. Pulmonary/Chest: Effort normal and breath sounds normal. He has no wheezes. He has no rales. He exhibits no tenderness.  Abdominal: Soft. Bowel sounds are normal. He exhibits no distension and no mass. There is no tenderness.  Musculoskeletal: Normal range of motion.  Neurological: He is alert and oriented to person, place, and time.  Skin: Skin is warm and dry.  Psychiatric: He has a normal mood and affect.      CMP Latest Ref Rng & Units 04/10/2017 01/23/2016 12/18/2015  Glucose 65 - 99 mg/dL 78 103(H) 99  BUN 8 - 27 mg/dL 9  7 17  Creatinine 0.76 - 1.27 mg/dL 1.08 1.21 1.10  Sodium 134 - 144 mmol/L 143 142 141  Potassium 3.5 - 5.2 mmol/L 4.0 4.2 3.9  Chloride 96 - 106 mmol/L 101 105 110  CO2 18 - 29 mmol/L 24 29 21(L)  Calcium 8.6 - 10.2 mg/dL 9.4 8.8(L) 8.9  Total Protein 6.0 - 8.5 g/dL 8.2 - -  Total Bilirubin 0.0 - 1.2 mg/dL 0.4 - -  Alkaline Phos 39 - 117 IU/L 94 - -  AST 0 - 40 IU/L 20 - -  ALT 0 - 44 IU/L 12 - -     Lipid Panel     Component Value Date/Time   CHOL 159 04/10/2017 1108   TRIG  116 04/10/2017 1108   HDL 41 04/10/2017 1108   CHOLHDL 3.9 04/10/2017 1108   CHOLHDL 2.5 12/09/2015 0133   VLDL 11 12/09/2015 0133   LDLCALC 95 04/10/2017 1108         Assessment & Plan:  1. Sick sinus syndrome (HCC) EKG from last visit revealed sinus bradycardia, inferior infarct which is new compared to previous EKGs Patient needs to see his cardiologist,Dr Terrence Dupont - may need cardiac cath He will be calling for an appointment. - CT Head Wo Contrast - No acute intracranial abnormality from 03/2017 - US Carotid Duplex Bilateral - negative for carotid stenosis from 03/2017  2. History of gastroesophageal reflux (GERD) Stable Continue PPI  3. Other emphysema ([place]) Controlled Continue inhalers  4. Benign prostatic hyperplasia without lower urinary tract symptoms Controlled Continue Flomax  5. Atrial fibrillation, unspecified type ([place]) Rate control with metoprolol Currently on Amiodarone  6. Essential hypertension Controlled Low-sodium diet  7. Seizures ([place]) Continue Keppra Keep appointment with neurology  8. Chronic diastolic congestive heart failure (HCC) EF 50-55% No acute exacerbation Euvolemic.  9. Syncope, unspecified syncope type Workup unrevealing Sick sinus syndrome versus seizures Advised to present to the ED if he has a repeat

## 2017-05-22 ENCOUNTER — Other Ambulatory Visit (INDEPENDENT_AMBULATORY_CARE_PROVIDER_SITE_OTHER): Payer: Medicare HMO

## 2017-05-22 ENCOUNTER — Ambulatory Visit (INDEPENDENT_AMBULATORY_CARE_PROVIDER_SITE_OTHER): Payer: Medicare HMO | Admitting: Endocrinology

## 2017-05-22 ENCOUNTER — Encounter: Payer: Self-pay | Admitting: Endocrinology

## 2017-05-22 VITALS — BP 126/84 | HR 60 | Ht 67.0 in | Wt 237.0 lb

## 2017-05-22 DIAGNOSIS — E291 Testicular hypofunction: Secondary | ICD-10-CM | POA: Diagnosis not present

## 2017-05-22 DIAGNOSIS — E559 Vitamin D deficiency, unspecified: Secondary | ICD-10-CM

## 2017-05-22 DIAGNOSIS — Z125 Encounter for screening for malignant neoplasm of prostate: Secondary | ICD-10-CM | POA: Diagnosis not present

## 2017-05-22 LAB — TSH: TSH: 0.92 u[IU]/mL (ref 0.35–4.50)

## 2017-05-22 LAB — PSA: PSA: 0.7 ng/mL (ref 0.10–4.00)

## 2017-05-22 LAB — VITAMIN D 25 HYDROXY (VIT D DEFICIENCY, FRACTURES): VITD: 14.93 ng/mL — AB (ref 30.00–100.00)

## 2017-05-22 MED ORDER — VITAMIN D (ERGOCALCIFEROL) 1.25 MG (50000 UNIT) PO CAPS: 50000.0000 [IU] | ORAL_CAPSULE | ORAL | 0 refills | Status: AC

## 2017-05-22 NOTE — Patient Instructions (Addendum)
blood tests are requested for you today.  We'll let you know about the results.   normalization of testosterone is not known to harm you.  however, there are "theoretical" risks, including increased fertility, hair loss, prostate cancer, benign prostate enlargement, blood clots, liver problems, lower hdl ("good cholesterol"), polycythemia (opposite of anemia), sleep apnea, and behavior changes.   Weight loss helps theses conditions also.  Please come back for a follow-up appointment in 6 months.

## 2017-05-22 NOTE — Progress Notes (Signed)
Subjective:    Patient ID: [name] [name], male    DOB: Apr 14, 1951, 66 y.o.   MRN: [id number]  HPI Pt returns for f/u of idiopathic central hypogonadism (he has 1 biological child; he has never taken illicit androgens; he denies any h/o infertility; he was seen at [facility], where he was dx'ed with hypogonadotropic hypogonadism; however, pt says he wishes to f/u here in [place] instead; he was rx'ed with clomid starting in early 2015).  He takes clomid as rx'ed.  ED sxs persist.  Vit-D deficiency; he took high-dose ergocalciferol in mid-2017.  Denies leg cramps. Gynecomastia: tamoxifen was rx'ed in early 2017; he declines surgery.  Since then, he says breast swelling is only slightly better.  Past Medical History:  Diagnosis Date  . Anemia   . Asthma   . Atrial fibrillation ([place])   . CHF (congestive heart failure) ([place])   . COPD (chronic obstructive pulmonary disease) ([place])   . Heart disease   . Hypertension     Past Surgical History:  Procedure Laterality Date  . CARDIAC CATHETERIZATION N/A 12/08/2015   Procedure: Left Heart Cath and Coronary Angiography;  Surgeon: Charolette Forward, MD;  Location: [place] [facility];  Service: Cardiovascular;  Laterality: N/A;  . ESOPHAGUS SURGERY      Social History   Social History  . Marital status: Single    Spouse name: N/A  . Number of children: 1  . Years of education: college   Occupational History  . [employer]   Social History Main Topics  . Smoking status: Never Smoker  . Smokeless tobacco: Never Used  . Alcohol use No  . Drug use: No  . Sexual activity: Not Currently   Other Topics Concern  . Not on file   Social History Narrative   Patient lives at home alone and he is single.   Patient is semi retired.    Education college.   Right handed.   Caffeine two cokes daily.    Current Outpatient Prescriptions on File Prior to Visit  Medication Sig Dispense Refill  . ADVAIR DISKUS 250-50 MCG/DOSE AEPB  INHALE 1 PUFF INTO THE LUNGS 2 (TWO) TIMES DAILY. 180 each 0  . albuterol (PROVENTIL HFA;VENTOLIN HFA) 108 (90 Base) MCG/ACT inhaler Inhale 2 puffs into the lungs every 6 (six) hours as needed. 1 Inhaler 5  . albuterol (PROVENTIL) (2.5 MG/3ML) 0.083% nebulizer solution Take 3 mLs (2.5 mg total) by nebulization every 6 (six) hours as needed for wheezing or shortness of breath. 150 mL 1  . amiodarone (PACERONE) 200 MG tablet Take 1 tablet (200 mg total) by mouth 2 (two) times daily. 60 tablet 2  . aspirin EC 81 MG tablet Take 81 mg by mouth daily.      Marland Kitchen atorvastatin (LIPITOR) 40 MG tablet Take 1 tablet (40 mg total) by mouth daily at 6 PM. 30 tablet 3  . clomiPHENE (CLOMID) 50 MG tablet 1/4 tab daily 10 tablet 5  . clotrimazole-betamethasone (LOTRISONE) cream Apply 1 application topically 2 (two) times daily. (Patient taking differently: Apply 1 application topically 2 (two) times daily as needed (FOR RASH). ) 30 g 1  . colchicine 0.6 MG tablet Take 1 tablet (0.6 mg total) by mouth 2 (two) times daily. 60 tablet 3  . digoxin (LANOXIN) 0.125 MG tablet Take 1 tablet (0.125 mg total) by mouth daily. 30 tablet 3  . ferrous sulfate 325 (65 FE) MG tablet Take 325 mg by mouth daily with  breakfast. Reported on 03/16/2016    . furosemide (LASIX) 20 MG tablet TAKE 1 TABLET (20 MG TOTAL) BY MOUTH DAILY. 30 tablet 0  . levETIRAcetam (KEPPRA) 750 MG tablet Take 2 tablets (1,500 mg total) by mouth 2 (two) times daily. 120 tablet 11  . lisinopril (PRINIVIL,ZESTRIL) 20 MG tablet Take 20 mg by mouth daily.     . metoprolol succinate (TOPROL-XL) 25 MG 24 hr tablet Take 25 mg by mouth daily.  3  . nitroGLYCERIN (NITROSTAT) 0.4 MG SL tablet Place 1 tablet (0.4 mg total) under the tongue every 5 (five) minutes as needed for chest pain. 25 tablet 1  . pantoprazole (PROTONIX) 40 MG tablet Take 40 mg by mouth daily.     . sildenafil (REVATIO) 20 MG tablet 2-5 pills as needed for ED symptoms 50 tablet 10  . tamoxifen  (NOLVADEX) 10 MG tablet Take 1 tablet (10 mg total) by mouth daily. 30 tablet 11  . tamsulosin (FLOMAX) 0.4 MG CAPS capsule Take 1 capsule (0.4 mg total) by mouth daily. 30 capsule 3   No current facility-administered medications on file prior to visit.     No Known Allergies  Family History  Problem Relation Age of Onset  . Cancer Mother     BP 126/84   Pulse 60   Ht 5\' 7"  (1.702 m)   Wt 237 lb (107.5 kg)   SpO2 97%   BMI 37.12 kg/m    Review of Systems denies decreased urinary stream and numbness    Objective:   Physical Exam VITAL SIGNS:  See vs page.  GENERAL: no distress. Chest wall: severe bilat pseudogynecomastia is again noted. GENITALIA: Normal male testicles, scrotum, and penis Ext: 1+ bilat leg edema.   25-OH vit-D=15     Assessment & Plan:  Vit-D deficiency: he needs increased rx.  I have sent a prescription to your pharmacy. Gynecomastia: persistent despite rx. Hypogonadism: due for recheck.   Patient Instructions  blood tests are requested for you today.  We'll let you know about the results.   normalization of testosterone is not known to harm you.  however, there are "theoretical" risks, including increased fertility, hair loss, prostate cancer, benign prostate enlargement, blood clots, liver problems, lower hdl ("good cholesterol"), polycythemia (opposite of anemia), sleep apnea, and behavior changes.   Weight loss helps theses conditions also.  Please come back for a follow-up appointment in 6 months.

## 2017-06-05 ENCOUNTER — Other Ambulatory Visit: Payer: Self-pay

## 2017-06-05 NOTE — Patient Outreach (Signed)
[facility]) Care Management  [place]  06/05/2017   [name] [name] [birth year] [id number]  Subjective: Telephone call to patient for monthly call.  Patient reports having poor stamina still but does daily routine.  Patient denies any more blackout episodes and states that cardiologist just wants to continue to monitor.  Patient reports weight continues at 238 lbs. Patient reports that he is really monitoring his diet because he feels that his episode was related to his salt intake and blood pressure spike.  Discussed with patient importance of watching slat and fat content in food as it relates to heart failure.  He verbalized understanding.   Objective:   Encounter Medications:  Outpatient Encounter Prescriptions as of 06/05/2017  Medication Sig Note  . ADVAIR DISKUS 250-50 MCG/DOSE AEPB INHALE 1 PUFF INTO THE LUNGS 2 (TWO) TIMES DAILY.   Marland Kitchen albuterol (PROVENTIL HFA;VENTOLIN HFA) 108 (90 Base) MCG/ACT inhaler Inhale 2 puffs into the lungs every 6 (six) hours as needed.   Marland Kitchen albuterol (PROVENTIL) (2.5 MG/3ML) 0.083% nebulizer solution Take 3 mLs (2.5 mg total) by nebulization every 6 (six) hours as needed for wheezing or shortness of breath.   Marland Kitchen amiodarone (PACERONE) 200 MG tablet Take 1 tablet (200 mg total) by mouth 2 (two) times daily.   Marland Kitchen aspirin EC 81 MG tablet Take 81 mg by mouth daily.     Marland Kitchen atorvastatin (LIPITOR) 40 MG tablet Take 1 tablet (40 mg total) by mouth daily at 6 PM.   . clomiPHENE (CLOMID) 50 MG tablet 1/4 tab daily   . clotrimazole-betamethasone (LOTRISONE) cream Apply 1 application topically 2 (two) times daily. (Patient taking differently: Apply 1 application topically 2 (two) times daily as needed (FOR RASH). )   . colchicine 0.6 MG tablet Take 1 tablet (0.6 mg total) by mouth 2 (two) times daily.   . digoxin (LANOXIN) 0.125 MG tablet Take 1 tablet (0.125 mg total) by mouth daily.   . ferrous sulfate 325 (65 FE) MG tablet Take 325 mg by mouth  daily with breakfast. Reported on 03/16/2016 02/07/2016: Will pick it up over the counter per patient.  . furosemide (LASIX) 20 MG tablet TAKE 1 TABLET (20 MG TOTAL) BY MOUTH DAILY.   Marland Kitchen levETIRAcetam (KEPPRA) 750 MG tablet Take 2 tablets (1,500 mg total) by mouth 2 (two) times daily.   Marland Kitchen lisinopril (PRINIVIL,ZESTRIL) 20 MG tablet Take 20 mg by mouth daily.    . metoprolol succinate (TOPROL-XL) 25 MG 24 hr tablet Take 25 mg by mouth daily. 12/27/2016: Received from: External Pharmacy Received Sig: TAKE 1 TABLET BY MOUTH EVERY DAY  . nitroGLYCERIN (NITROSTAT) 0.4 MG SL tablet Place 1 tablet (0.4 mg total) under the tongue every 5 (five) minutes as needed for chest pain.   . pantoprazole (PROTONIX) 40 MG tablet Take 40 mg by mouth daily.  03/08/2016: Received from: External Pharmacy  . sildenafil (REVATIO) 20 MG tablet 2-5 pills as needed for ED symptoms   . tamoxifen (NOLVADEX) 10 MG tablet Take 1 tablet (10 mg total) by mouth daily.   . tamsulosin (FLOMAX) 0.4 MG CAPS capsule Take 1 capsule (0.4 mg total) by mouth daily.   . Vitamin D, Ergocalciferol, (DRISDOL) 50000 units CAPS capsule Take 1 capsule (50,000 Units total) by mouth 3 (three) times a week.    No facility-administered encounter medications on file as of 06/05/2017.     Functional Status:  No flowsheet data found.  Fall/Depression Screening: Fall Risk  06/05/2017 05/03/2017 03/07/2017  Falls in the past year? No No No  Risk for fall due to : - - -   PHQ 2/9 Scores 06/05/2017 05/03/2017 03/07/2017 02/08/2017 01/08/2017 12/11/2016 11/21/2016  PHQ - 2 Score 0 0 0 0 0 0 0    Assessment: Patient continues to benefit from health coach outreach for disease management and support.    Plan:  [facility] CM Care Plan Problem One     Most Recent Value  Care Plan Problem One  Heart failure knowledge deficit  Role Documenting the Problem One  [place] for Problem One  Active  [facility] Long Term Goal   Patient will be able to identify heart  failure zones within 90 days.    [facility] Long Term Goal Start Date  06/05/17 [name] continued]  Interventions for Problem One Long Term Goal  RN Health Coach reviewed with patient heart failure zones and when to notify physician.       RN Health Coach will contact patient in the month of July and patient agrees to next outreach.  Jone Baseman, RN, MSN [place] [phone]

## 2017-06-12 ENCOUNTER — Ambulatory Visit (INDEPENDENT_AMBULATORY_CARE_PROVIDER_SITE_OTHER): Payer: Medicare HMO | Admitting: Neurology

## 2017-06-12 ENCOUNTER — Encounter: Payer: Self-pay | Admitting: Neurology

## 2017-06-12 VITALS — BP 137/79 | HR 61 | Ht 67.0 in | Wt 235.0 lb

## 2017-06-12 DIAGNOSIS — R410 Disorientation, unspecified: Secondary | ICD-10-CM | POA: Insufficient documentation

## 2017-06-12 NOTE — Progress Notes (Signed)
[facility]  PATIENT: [name] [name] DOB: 1951-10-12   HISTORY OF PRESENT ILLNESS: Patient is a 66 years old male, referred by his primary care physician Dr. Annitta Needs for evaluation of recurrent right arm shaking  He has past medical history of hypertension, paroxysmal A. fib asthma, GERD , hypogonadism Since 2012 he began to have stereotypical episodes of sudden onset involuntary right arm large amplitude shaking, with no right face, leg involvement, no loss of consciousness, lasting 2-3 minutes, it happened, average couple times each week, no generalized seizure  He reported that his brother, and father had similar event, was diagnosed with epilepsy Over the years, he has gradually developed feminine features, including gynecomastia, voice change, erectile dysfunction, this is under evaluation of his endocrinologist. EEG was normal MRI brain Oct 2015 there was evidence of multiple periventricular white matter disease, most likely small vessel disease  For concern of possible partial seizure with his clinical presentation, I have started him on Keppra since October 2015, initially was 500 mg twice a day, he reported variable improvement, continue complains of sudden onset uncontrollable  large amplitude forceful right arm shaking,lasting for 1 minutes, without loss of consciousness   Laboratory showed elevated ESR 57, positive ANA, double-stranded DNA, normal L93, folic acid, negative RPR, HIV.  Despite titrating dose of Keppra eventually to 750 mg 2 tablets twice a day, he continue complains of recurrent episode of right hand shaking,   Also had a sleep study, diagnosed with obstructive sleep apnea, but he could not tolerate CPAP machine, during his most recent visit in December 2017, he stated he has been complying with his Keppra 1500 mg twice a day,   But Keppra level was undetectable, extensive laboratory evaluation showed normal TSH, decreased vitamin D level 15,  decreased testosterone level 155, with normal free testosterone level, CMP was normal. Normal lipid panel  He has no recurrent spells,  He supposed to take Keppra 750 mg 2 tablets twice a day  REVIEW OF SYSTEMS: Full 14 system review of systems performed and notable only for those listed, all others are neg:  As above   ALLERGIES: No Known Allergies  HOME MEDICATIONS: Outpatient Medications Prior to Visit  Medication Sig Dispense Refill  . ADVAIR DISKUS 250-50 MCG/DOSE AEPB Inhale 1 puff into the lungs 2 (two) times daily. 180 each 0  . albuterol (PROVENTIL HFA;VENTOLIN HFA) 108 (90 Base) MCG/ACT inhaler Inhale 2 puffs into the lungs every 6 (six) hours as needed. 1 Inhaler 5  . albuterol (PROVENTIL) (2.5 MG/3ML) 0.083% nebulizer solution Take 3 mLs (2.5 mg total) by nebulization every 6 (six) hours as needed for wheezing or shortness of breath. 150 mL 1  . amiodarone (PACERONE) 200 MG tablet Take 1 tablet (200 mg total) by mouth 2 (two) times daily. 60 tablet 2  . aspirin EC 81 MG tablet Take 81 mg by mouth daily.      Marland Kitchen atorvastatin (LIPITOR) 40 MG tablet Take 1 tablet (40 mg total) by mouth daily at 6 PM. 30 tablet 3  . clomiPHENE (CLOMID) 50 MG tablet 1/4 tab daily 10 tablet 5  . clotrimazole-betamethasone (LOTRISONE) cream Apply 1 application topically 2 (two) times daily. (Patient taking differently: Apply 1 application topically 2 (two) times daily as needed (FOR RASH). ) 30 g 1  . colchicine 0.6 MG tablet Take 1 tablet (0.6 mg total) by mouth 2 (two) times daily. 60 tablet 3  . digoxin (LANOXIN) 0.125 MG tablet Take 1 tablet (0.125 mg  total) by mouth daily. 30 tablet 3  . ferrous sulfate 325 (65 FE) MG tablet Take 325 mg by mouth daily with breakfast. Reported on 03/16/2016    . furosemide (LASIX) 20 MG tablet TAKE 1 TABLET (20 MG TOTAL) BY MOUTH DAILY. 30 tablet 0  . levETIRAcetam (KEPPRA) 750 MG tablet Take 2 tablets (1,500 mg total) by mouth 2 (two) times daily. 120 tablet 11  .  lisinopril (PRINIVIL,ZESTRIL) 20 MG tablet Take 20 mg by mouth daily.     . metoprolol succinate (TOPROL-XL) 50 MG 24 hr tablet Take 50 mg by mouth daily.   3  . nitroGLYCERIN (NITROSTAT) 0.4 MG SL tablet Place 1 tablet (0.4 mg total) under the tongue every 5 (five) minutes as needed for chest pain. 25 tablet 1  . pantoprazole (PROTONIX) 40 MG tablet Take 40 mg by mouth daily.     . sildenafil (REVATIO) 20 MG tablet 2-5 pills as needed for ED symptoms 50 tablet 10  . tamoxifen (NOLVADEX) 10 MG tablet Take 1 tablet (10 mg total) by mouth daily. 30 tablet 11  . tamsulosin (FLOMAX) 0.4 MG CAPS capsule Take 1 capsule (0.4 mg total) by mouth daily. 30 capsule 3  . Vitamin D, Ergocalciferol, (DRISDOL) 50000 units CAPS capsule Take 1 capsule (50,000 Units total) by mouth 3 (three) times a week. 12 capsule 0   No facility-administered medications prior to visit.     PAST MEDICAL HISTORY: Past Medical History:  Diagnosis Date  . Anemia   . Asthma   . Atrial fibrillation ([place])   . CHF (congestive heart failure) ([place])   . COPD (chronic obstructive pulmonary disease) ([place])   . Heart disease   . Hypertension     PAST SURGICAL HISTORY: Past Surgical History:  Procedure Laterality Date  . CARDIAC CATHETERIZATION N/A 12/08/2015   Procedure: Left Heart Cath and Coronary Angiography;  Surgeon: Charolette Forward, MD;  Location: [place] [facility];  Service: Cardiovascular;  Laterality: N/A;  . ESOPHAGUS SURGERY      FAMILY HISTORY: Family History  Problem Relation Age of Onset  . Cancer Mother     SOCIAL HISTORY: Social History   Social History  . Marital status: Single    Spouse name: N/A  . Number of children: 1  . Years of education: college   Occupational History  . [employer]IVER [employer]   Social History Main Topics  . Smoking status: Never Smoker  . Smokeless tobacco: Never Used  . Alcohol use No  . Drug use: No  . Sexual activity: Not Currently   Other Topics Concern  .  Not on file   Social History Narrative   Patient lives at home alone and he is single.   Patient is semi retired.    Education college.   Right handed.   Caffeine two cokes daily.     PHYSICAL EXAM  Vitals:   12/12/16 1331  BP: 133/66  Pulse: 66  Weight: 244 lb 12.8 oz (111 kg)  Height: '5\' 9"'  (1.753 m)   Body mass index is 36.15 kg/m. Gen: NAD, conversant, well nourised, obese, well groomed  Cardiovascular: Regular rate rhythm,  Neck: Supple, no carotid bruit. Pulmonary: Clear to auscultation bilateral,   NEUROLOGICAL EXAM:  MENTAL STATUS: Speech:Speech is normal; fluent and spontaneous with normal comprehension.  Cognition:The patient is oriented to person, place, and time; recent and remote memory intact;   language fluent;  normal attention, concentration, fund of knowledge. CRANIAL NERVES: CN II:  Visual fields are full to confrontation. Fundoscopic exam is normal with sharp discs   Pupils are  briskly reactive to light.. CN III, IV, VI: extraocular movement are normal. No ptosis. CN V: Facial sensation is intact to pinprick in all 3 divisions bilaterally.   CN VII: Face is symmetric with normal eye closure and smile. CN VIII: Hearing is normal to rubbing fingers CN IX, X: Palate elevates symmetrically. Phonation is normal. CN XI: Head turning and shoulder shrug are intact CN XII: Tongue is midline with normal movements and no atrophy.  MOTOR:There is no pronator drift of out-stretched arms. Muscle bulk and tone are normal. Muscle strength is normal. REFLEXES:Reflexes are 2+ and symmetric at the biceps, triceps, knees, and ankles. Plantar responses are flexor. SENSORY:Light touch, pinprick, position sense, and vibration sense are intact in fingers and toes. COORDINATION:Rapid alternating movements and fine finger movements are intact. There is no dysmetria on finger-to-nose and heel-knee-shin.  GAIT/STANCE: Gait is steady with normal steps,  base, arm swing, and turning. Heel and toe walking are normal. Tandem gait is normal. No assistive device Romberg is absent.     DIAGNOSTIC DATA (LABS, IMAGING, TESTING) - I reviewed patient records, labs, notes, testing and imaging myself where available.  Lab Results  Component Value Date   WBC 7.8 01/23/2016   HGB 10.1 (L) 01/23/2016   HCT 33.5 (L) 01/23/2016   MCV 66.7 (L) 01/23/2016   PLT 454 (H) 01/23/2016      Component Value Date/Time   NA 142 01/23/2016 1515   K 4.2 01/23/2016 1515   CL 105 01/23/2016 1515   CO2 29 01/23/2016 1515   GLUCOSE 103 (H) 01/23/2016 1515   BUN 7 01/23/2016 1515   CREATININE 1.21 01/23/2016 1515   CREATININE 1.06 03/12/2015 1015   CALCIUM 8.8 (L) 01/23/2016 1515   PROT 7.1 12/15/2015 1125   ALBUMIN 1.8 (L) 12/15/2015 1125   AST 62 (H) 12/15/2015 1125   ALT 54 12/15/2015 1125   ALKPHOS 182 (H) 12/15/2015 1125   BILITOT 0.5 12/15/2015 1125   GFRNONAA >60 01/23/2016 1515   GFRNONAA 73 03/12/2015 1015   GFRAA >60 01/23/2016 1515   GFRAA 84 03/12/2015 1015   Lab Results  Component Value Date   CHOL 144 12/09/2015   HDL 57 12/09/2015   LDLCALC 76 12/09/2015   TRIG 53 12/09/2015   CHOLHDL 2.5 12/09/2015     Lab Results  Component Value Date   TSH 1.16 11/23/2016      ASSESSMENT AND PLAN  66 y.o. year old male  has a past medical history   Recurrent episode of uncontrollable right arm shaking,  unsure etiology Not complying with his Keppra treatment, I have suggested him to stop Her use MRI of the brain, EEG showed no significant abnormality He denies further evaluation and treatment, will refer him to academic neurologist   Marcial Pacas, M.D. Ph.D.  [facility] [place], [place] [address] Phone: [phone] Fax:      [phone]

## 2017-06-15 NOTE — Progress Notes (Deleted)
Office Visit Note  Patient: [name] [name]             Date of Birth: 31-Oct-1951           MRN: [id number]             PCP: Arnoldo Morale, MD Referring: Arnoldo Morale, MD Visit Date: 06/19/2017 Occupation: @GUAROCC @    Subjective:  No chief complaint on file.   History of Present Illness: [name] [name] is a 66 y.o. male ***   Activities of Daily Living:  Patient reports morning stiffness for *** {minute/hour:19697}.   Patient {ACTIONS;DENIES/REPORTS:21021675::"Denies"} nocturnal pain.  Difficulty dressing/grooming: {ACTIONS;DENIES/REPORTS:21021675::"Denies"} Difficulty climbing stairs: {ACTIONS;DENIES/REPORTS:21021675::"Denies"} Difficulty getting out of chair: {ACTIONS;DENIES/REPORTS:21021675::"Denies"} Difficulty using hands for taps, buttons, cutlery, and/or writing: {ACTIONS;DENIES/REPORTS:21021675::"Denies"}   No Rheumatology ROS completed.   PMFS History:  Patient Active Problem List   Diagnosis Date Noted  . Confusion 06/12/2017  . Elevated sed rate 12/26/2016  . History of gastroesophageal reflux (GERD) 12/22/2016  . History of asthma 12/22/2016  . History of COPD 12/22/2016  . Benign prostatic hyperplasia 03/21/2016  . Abnormal involuntary movements 03/08/2016  . COPD (chronic obstructive pulmonary disease) ([place]) 12/24/2015  . Pleural effusion on left   . HCAP (healthcare-associated pneumonia) 12/15/2015  . Left lower lobe pneumonia ([place])   . Sepsis due to pneumonia ([place]) 11/27/2015  . Acute respiratory failure ([place]) 11/27/2015  . Asthma exacerbation 11/27/2015  . Anemia 11/27/2015  . CHF (congestive heart failure) ([place]) 11/27/2015  . Sepsis ([place]) 11/27/2015  . Screening for prostate cancer 03/03/2015  . Gynecomastia 03/03/2015  . Seizures ([place]) 10/09/2014  . Essential hypertension 08/12/2014  . Chest pain 01/24/2014  . Sick sinus syndrome ([place]) 03/21/2013  . TINEA CRURIS 09/30/2010  . Hypogonadism, male 09/23/2010  . GERD 09/01/2010  . DIZZINESS  09/01/2010  . CHEST PAIN UNSPECIFIED 09/01/2010  . Sleep apnea 08/20/2010  . Other fatigue 06/28/2010  . Abnormal involuntary movement 06/28/2010  . Vitamin D deficiency 03/25/2010  . Hypogonadism male 01/20/2010  . ERECTILE DYSFUNCTION 11/26/2008  . Essential hypertension, benign 11/26/2008  . [place] ANEMIA 09/24/2008  . IRON DEFIC ANEMIA [place] DIET IRON INTAKE 09/21/2008  . Atrial fibrillation ([place]) 09/21/2008  . SICK SINUS SYNDROME 09/21/2008  . Asthma 09/21/2008  . ESOPHAGITIS 08/26/2008  . ESOPHAGEAL MOTILITY DISORDER 08/26/2008  . GASTRITIS, ACUTE 08/26/2008    Past Medical History:  Diagnosis Date  . Anemia   . Asthma   . Atrial fibrillation ([place])   . CHF (congestive heart failure) ([place])   . COPD (chronic obstructive pulmonary disease) ([place])   . Heart disease   . Hypertension     Family History  Problem Relation Age of Onset  . Cancer Mother    Past Surgical History:  Procedure Laterality Date  . CARDIAC CATHETERIZATION N/A 12/08/2015   Procedure: Left Heart Cath and Coronary Angiography;  Surgeon: Charolette Forward, MD;  Location: [facility];  Service: Cardiovascular;  Laterality: N/A;  . ESOPHAGUS SURGERY     Social History   Social History Narrative   Patient lives at home alone and he is single.   Patient is semi retired.    Education college.   Right handed.   Caffeine two cokes daily.     Objective: Vital Signs: There were no vitals taken for this visit.   Physical Exam   Musculoskeletal Exam: ***  CDAI Exam: No CDAI exam completed.    Investigation: Findings:  April 2016:  CBC showed hemoglobin of 11.3,  MCV was low, comprehensive metabolic panel was normal, sed rate was 38, CK 52, ANA was negative, double-strand DNA 20.  SPEP, G6PD, C3, C4, beta-2 anticardiolipin were normal.  Vitamin D was low at 11.  Labs from October 2015:  B12, RPR, HIV, folate were normal limits.  ANA was positive, no titer given.  Double-strand DNA was  positive at 17.  ENA and Jo-1 antibodies were negative, sed rate was 54.  From March 2015:  CBC, comprehensive metabolic panel, cholesterol, B12, TSH were normal.  Hemoglobin A1c was 5.4.    Imaging: No results found.  Speciality Comments: No specialty comments available.    Procedures:  No procedures performed Allergies: Patient has no known allergies.   Assessment / Plan:     Visit Diagnoses: ANA positive/ dsDNA low titer positive   Other fatigue  Vitamin D deficiency  History of anemia  Elevated sed rate  History of hypertension  History of coronary artery disease  History of CHF (congestive heart failure)  History of atrial fibrillation  History of sepsis  History of seizure  History of asthma  History of COPD  History of gastroesophageal reflux (GERD)    Orders: No orders of the defined types were placed in this encounter.  No orders of the defined types were placed in this encounter.   Face-to-face time spent with patient was *** minutes. 50% of time was spent in counseling and coordination of care.  Follow-Up Instructions: No Follow-up on file.   Amy Littrell, RT  Note - This record has been created using [employer].  Chart creation errors have been sought, but may not always  have been located. Such creation errors do not reflect on  the standard of medical care.

## 2017-06-19 ENCOUNTER — Ambulatory Visit: Payer: Self-pay | Admitting: Rheumatology

## 2017-06-26 ENCOUNTER — Ambulatory Visit: Payer: Self-pay | Admitting: Rheumatology

## 2017-06-28 ENCOUNTER — Other Ambulatory Visit: Payer: Self-pay

## 2017-06-28 NOTE — Patient Outreach (Signed)
[facility]) Care Management  06/28/2017  [name] [name] May 09, 1951 [id number]   Telephone call to patient for monthly call.  Patient states he is driving right now and asked if he could call me tomorrow.  Advised patient that was fine.  Plan: RN Health Coach will wait patient return call.  If not return call tomorrow will plan to attempt again in the month of July.  Jone Baseman, RN, MSN [place] [phone]

## 2017-07-09 ENCOUNTER — Other Ambulatory Visit: Payer: Self-pay

## 2017-07-09 NOTE — Patient Outreach (Signed)
[place] [facility]) Care Management  07/09/2017  [name] [name] December 02, 1951 [id number]   2nd Telephone call to patient for monthly call.  No answer.  HIPAA compliant voice message left.  Plan: RN Health Coach will attempt patient again in the month of July.   Jone Baseman, RN, MSN [place] [phone]

## 2017-07-11 DIAGNOSIS — I2510 Atherosclerotic heart disease of native coronary artery without angina pectoris: Secondary | ICD-10-CM | POA: Diagnosis not present

## 2017-07-11 DIAGNOSIS — E6609 Other obesity due to excess calories: Secondary | ICD-10-CM | POA: Diagnosis not present

## 2017-07-11 DIAGNOSIS — K219 Gastro-esophageal reflux disease without esophagitis: Secondary | ICD-10-CM | POA: Diagnosis not present

## 2017-07-11 DIAGNOSIS — J45909 Unspecified asthma, uncomplicated: Secondary | ICD-10-CM | POA: Diagnosis not present

## 2017-07-11 DIAGNOSIS — I10 Essential (primary) hypertension: Secondary | ICD-10-CM | POA: Diagnosis not present

## 2017-07-11 DIAGNOSIS — D649 Anemia, unspecified: Secondary | ICD-10-CM | POA: Diagnosis not present

## 2017-07-11 DIAGNOSIS — E785 Hyperlipidemia, unspecified: Secondary | ICD-10-CM | POA: Diagnosis not present

## 2017-07-11 DIAGNOSIS — Z6834 Body mass index (BMI) 34.0-34.9, adult: Secondary | ICD-10-CM | POA: Diagnosis not present

## 2017-07-18 ENCOUNTER — Other Ambulatory Visit: Payer: Self-pay

## 2017-07-18 NOTE — Patient Outreach (Signed)
[place] [facility]) Care Management  [place]  07/18/2017   MARSHEL GOLUBSKI 01-Dec-1951 [id number]  Subjective: Telephone call to patient for monthly call.  Patient reports he is doing ok.  He reports he has started at the [facility] for exercise.  Discussed with patient exercise and how it helps the heart muscle.  He verbalized understanding. Patient reports some weight loss due to lack of appetite in the warmer weather and that he also wants to lose some weight.  Discussed with patient benefits of weight loss.  Also discussed heart failure symptoms.  He verbalized understanding.   Objective:   Encounter Medications:  Outpatient Encounter Prescriptions as of 07/18/2017  Medication Sig Note  . ADVAIR DISKUS 250-50 MCG/DOSE AEPB INHALE 1 PUFF INTO THE LUNGS 2 (TWO) TIMES DAILY.   Marland Kitchen albuterol (PROVENTIL HFA;VENTOLIN HFA) 108 (90 Base) MCG/ACT inhaler Inhale 2 puffs into the lungs every 6 (six) hours as needed.   Marland Kitchen albuterol (PROVENTIL) (2.5 MG/3ML) 0.083% nebulizer solution Take 3 mLs (2.5 mg total) by nebulization every 6 (six) hours as needed for wheezing or shortness of breath.   Marland Kitchen amiodarone (PACERONE) 200 MG tablet Take 1 tablet (200 mg total) by mouth 2 (two) times daily.   Marland Kitchen aspirin EC 81 MG tablet Take 81 mg by mouth daily.     Marland Kitchen atorvastatin (LIPITOR) 40 MG tablet Take 1 tablet (40 mg total) by mouth daily at 6 PM.   . clomiPHENE (CLOMID) 50 MG tablet 1/4 tab daily   . clotrimazole-betamethasone (LOTRISONE) cream Apply 1 application topically 2 (two) times daily. (Patient taking differently: Apply 1 application topically 2 (two) times daily as needed (FOR RASH). )   . colchicine 0.6 MG tablet Take 1 tablet (0.6 mg total) by mouth 2 (two) times daily.   . digoxin (LANOXIN) 0.125 MG tablet Take 1 tablet (0.125 mg total) by mouth daily.   . ferrous sulfate 325 (65 FE) MG tablet Take 325 mg by mouth daily with breakfast. Reported on 03/16/2016 02/07/2016: Will pick it up over the  counter per patient.  . furosemide (LASIX) 20 MG tablet TAKE 1 TABLET (20 MG TOTAL) BY MOUTH DAILY.   Marland Kitchen lisinopril (PRINIVIL,ZESTRIL) 20 MG tablet Take 20 mg by mouth daily.    . metoprolol succinate (TOPROL-XL) 25 MG 24 hr tablet Take 25 mg by mouth daily. 12/27/2016: Received from: External Pharmacy Received Sig: TAKE 1 TABLET BY MOUTH EVERY DAY  . nitroGLYCERIN (NITROSTAT) 0.4 MG SL tablet Place 1 tablet (0.4 mg total) under the tongue every 5 (five) minutes as needed for chest pain.   . pantoprazole (PROTONIX) 40 MG tablet Take 40 mg by mouth daily.  03/08/2016: Received from: External Pharmacy  . sildenafil (REVATIO) 20 MG tablet 2-5 pills as needed for ED symptoms   . tamoxifen (NOLVADEX) 10 MG tablet Take 1 tablet (10 mg total) by mouth daily.   . tamsulosin (FLOMAX) 0.4 MG CAPS capsule Take 1 capsule (0.4 mg total) by mouth daily.    No facility-administered encounter medications on file as of 07/18/2017.     Functional Status:  No flowsheet data found.  Fall/Depression Screening: Fall Risk  07/18/2017 06/05/2017 05/03/2017  Falls in the past year? No No No  Risk for fall due to : - - -   PHQ 2/9 Scores 07/18/2017 06/05/2017 05/03/2017 03/07/2017 02/08/2017 01/08/2017 12/11/2016  PHQ - 2 Score 0 0 0 0 0 0 0    Assessment: Patient continues to benefit from health coach  outreach for disease management and support.    Plan:  [facility] CM Care Plan Problem One     Most Recent Value  Care Plan Problem One  Heart failure knowledge deficit  Role Documenting the Problem One  [place] for Problem One  Active  [facility] Long Term Goal   Patient will be able to identify heart failure zones within 90 days.    [facility]N Long Term Goal Start Date  07/18/17 [goal continued]  Interventions for Problem One Long Term Goal  RN Health Coach discussed with patient heart failure zones and when to notify physician.       RN Health Coach will contact patient in the month of August and patient agrees to next  outreach.  Jone Baseman, RN, MSN [place] [phone]

## 2017-07-27 ENCOUNTER — Other Ambulatory Visit: Payer: Self-pay | Admitting: Nurse Practitioner

## 2017-08-10 ENCOUNTER — Other Ambulatory Visit: Payer: Self-pay

## 2017-08-10 NOTE — Patient Outreach (Signed)
[facility]) Care Management  08/10/2017  [name] [name] 1951-09-13 [id number]   Telephone call to patient for monthly call.  Patient states he will call back in about 5 minutes.    Plan: RN Health Coach will wait patient return call.  If no return call, health coach will attempt patient again in the month of August.    [name], RN, MSN [place] [phone]

## 2017-08-17 ENCOUNTER — Encounter: Payer: Self-pay | Admitting: Family Medicine

## 2017-08-17 ENCOUNTER — Ambulatory Visit: Payer: Medicare HMO | Attending: Family Medicine | Admitting: Family Medicine

## 2017-08-17 VITALS — BP 123/72 | HR 50 | Temp 97.5°F | Resp 18 | Ht 67.0 in | Wt 231.0 lb

## 2017-08-17 DIAGNOSIS — I10 Essential (primary) hypertension: Secondary | ICD-10-CM

## 2017-08-17 DIAGNOSIS — Z79810 Long term (current) use of selective estrogen receptor modulators (SERMs): Secondary | ICD-10-CM | POA: Insufficient documentation

## 2017-08-17 DIAGNOSIS — Z7982 Long term (current) use of aspirin: Secondary | ICD-10-CM | POA: Insufficient documentation

## 2017-08-17 DIAGNOSIS — I5032 Chronic diastolic (congestive) heart failure: Secondary | ICD-10-CM

## 2017-08-17 DIAGNOSIS — E291 Testicular hypofunction: Secondary | ICD-10-CM

## 2017-08-17 DIAGNOSIS — N400 Enlarged prostate without lower urinary tract symptoms: Secondary | ICD-10-CM | POA: Insufficient documentation

## 2017-08-17 DIAGNOSIS — I480 Paroxysmal atrial fibrillation: Secondary | ICD-10-CM | POA: Insufficient documentation

## 2017-08-17 DIAGNOSIS — J449 Chronic obstructive pulmonary disease, unspecified: Secondary | ICD-10-CM | POA: Insufficient documentation

## 2017-08-17 DIAGNOSIS — E785 Hyperlipidemia, unspecified: Secondary | ICD-10-CM | POA: Diagnosis not present

## 2017-08-17 DIAGNOSIS — I4891 Unspecified atrial fibrillation: Secondary | ICD-10-CM | POA: Diagnosis not present

## 2017-08-17 DIAGNOSIS — J4520 Mild intermittent asthma, uncomplicated: Secondary | ICD-10-CM | POA: Diagnosis not present

## 2017-08-17 DIAGNOSIS — Z8709 Personal history of other diseases of the respiratory system: Secondary | ICD-10-CM | POA: Diagnosis not present

## 2017-08-17 DIAGNOSIS — I2510 Atherosclerotic heart disease of native coronary artery without angina pectoris: Secondary | ICD-10-CM | POA: Insufficient documentation

## 2017-08-17 DIAGNOSIS — I110 Hypertensive heart disease with heart failure: Secondary | ICD-10-CM | POA: Diagnosis not present

## 2017-08-17 DIAGNOSIS — K219 Gastro-esophageal reflux disease without esophagitis: Secondary | ICD-10-CM | POA: Diagnosis not present

## 2017-08-17 DIAGNOSIS — I4892 Unspecified atrial flutter: Secondary | ICD-10-CM | POA: Diagnosis not present

## 2017-08-17 DIAGNOSIS — E669 Obesity, unspecified: Secondary | ICD-10-CM | POA: Diagnosis not present

## 2017-08-17 DIAGNOSIS — R569 Unspecified convulsions: Secondary | ICD-10-CM | POA: Diagnosis not present

## 2017-08-17 DIAGNOSIS — D649 Anemia, unspecified: Secondary | ICD-10-CM | POA: Insufficient documentation

## 2017-08-17 MED ORDER — ALBUTEROL SULFATE HFA 108 (90 BASE) MCG/ACT IN AERS: 2.0000 | INHALATION_SPRAY | Freq: Four times a day (QID) | RESPIRATORY_TRACT | 5 refills | Status: DC | PRN

## 2017-08-17 MED ORDER — FLUTICASONE-SALMETEROL 250-50 MCG/DOSE IN AEPB: 1.0000 | INHALATION_SPRAY | Freq: Two times a day (BID) | RESPIRATORY_TRACT | 0 refills | Status: DC

## 2017-08-17 MED ORDER — ALBUTEROL SULFATE (2.5 MG/3ML) 0.083% IN NEBU: 2.5000 mg | INHALATION_SOLUTION | Freq: Four times a day (QID) | RESPIRATORY_TRACT | 1 refills | Status: AC | PRN

## 2017-08-17 NOTE — Progress Notes (Signed)
Subjective:  Patient ID: [name] [name], male    DOB: 27-Apr-1951  Age: 66 y.o. MRN: [id number]  CC: Follow-up of hypertension  HPI [name] [name] is a 66 year old male with a history of paroxysmal A. fib, atrial flutter, diastolic congestive heart failure (EF 50-55% in 11/2015), CAD (status post cardiac cath in 11/2015 - mid LAD 20% stenosis, dist LAD 40% stenosis, Ost RCA to prox RCA 30% stenosis, normal LV systolic function), sick sinus syndrome, COPD, GERD, seizures (managed by neurology), BPH, hypogonadism (managed by endocrine Here for a follow-up visit.  He endorses compliance with his medications and denies adverse effects from them. At his last visit he had complained of syncope and workup including CT, cardiac Doppler exam unrevealing. He has not had any repeat episodes since then.  He denies shortness of breath, pedal edema or chest pain and has been able to improve his exercise regimen; [facility] and has lost 6 lbs in the last 3 months. His cardiologist is Dr. Terrence Dupont and he does have an upcoming appointment with him.  His asthma and COPD have been controlled and he denies any recent flares. Denies any recent seizures.   Past Medical History:  Diagnosis Date  . Anemia   . Asthma   . Atrial fibrillation ([place])   . CHF (congestive heart failure) ([place])   . COPD (chronic obstructive pulmonary disease) ([place])   . Heart disease   . Hypertension     Past Surgical History:  Procedure Laterality Date  . CARDIAC CATHETERIZATION N/A 12/08/2015   Procedure: Left Heart Cath and Coronary Angiography;  Surgeon: Charolette Forward, MD;  Location: [facility];  Service: Cardiovascular;  Laterality: N/A;  . ESOPHAGUS SURGERY      No Known Allergies   Outpatient Medications Prior to Visit  Medication Sig Dispense Refill  . amiodarone (PACERONE) 200 MG tablet Take 1 tablet (200 mg total) by mouth 2 (two) times daily. 60 tablet 2  . aspirin EC 81 MG tablet Take 81 mg  by mouth daily.      Marland Kitchen atorvastatin (LIPITOR) 40 MG tablet Take 1 tablet (40 mg total) by mouth daily at 6 PM. 30 tablet 3  . clomiPHENE (CLOMID) 50 MG tablet 1/4 tab daily 10 tablet 5  . clotrimazole-betamethasone (LOTRISONE) cream Apply 1 application topically 2 (two) times daily. (Patient taking differently: Apply 1 application topically 2 (two) times daily as needed (FOR RASH). ) 30 g 1  . colchicine 0.6 MG tablet Take 1 tablet (0.6 mg total) by mouth 2 (two) times daily. 60 tablet 3  . digoxin (LANOXIN) 0.125 MG tablet Take 1 tablet (0.125 mg total) by mouth daily. 30 tablet 3  . ferrous sulfate 325 (65 FE) MG tablet Take 325 mg by mouth daily with breakfast. Reported on 03/16/2016    . furosemide (LASIX) 20 MG tablet TAKE 1 TABLET (20 MG TOTAL) BY MOUTH DAILY. 30 tablet 0  . lisinopril (PRINIVIL,ZESTRIL) 20 MG tablet Take 20 mg by mouth daily.     . metoprolol succinate (TOPROL-XL) 25 MG 24 hr tablet Take 25 mg by mouth daily.  3  . nitroGLYCERIN (NITROSTAT) 0.4 MG SL tablet Place 1 tablet (0.4 mg total) under the tongue every 5 (five) minutes as needed for chest pain. 25 tablet 1  . pantoprazole (PROTONIX) 40 MG tablet Take 40 mg by mouth daily.     . sildenafil (REVATIO) 20 MG tablet 2-5 pills as needed for ED symptoms 50  tablet 10  . tamoxifen (NOLVADEX) 10 MG tablet Take 1 tablet (10 mg total) by mouth daily. 30 tablet 11  . tamsulosin (FLOMAX) 0.4 MG CAPS capsule Take 1 capsule (0.4 mg total) by mouth daily. 30 capsule 3  . ADVAIR DISKUS 250-50 MCG/DOSE AEPB INHALE 1 PUFF INTO THE LUNGS 2 (TWO) TIMES DAILY. 180 each 0  . albuterol (PROVENTIL HFA;VENTOLIN HFA) 108 (90 Base) MCG/ACT inhaler Inhale 2 puffs into the lungs every 6 (six) hours as needed. 1 Inhaler 5  . albuterol (PROVENTIL) (2.5 MG/3ML) 0.083% nebulizer solution Take 3 mLs (2.5 mg total) by nebulization every 6 (six) hours as needed for wheezing or shortness of breath. 150 mL 1   No facility-administered medications prior to  visit.     ROS Review of Systems  Constitutional: Negative for activity change and appetite change.  HENT: Negative for sinus pressure and sore throat.   Eyes: Negative for visual disturbance.  Respiratory: Negative for cough, chest tightness and shortness of breath.   Cardiovascular: Negative for chest pain and leg swelling.  Gastrointestinal: Negative for abdominal distention, abdominal pain, constipation and diarrhea.  Endocrine: Negative.   Genitourinary: Negative for dysuria.  Musculoskeletal: Negative for joint swelling and myalgias.  Skin: Negative for rash.  Allergic/Immunologic: Negative.   Neurological: Negative for weakness, light-headedness and numbness.  Psychiatric/Behavioral: Negative for dysphoric mood and suicidal ideas.    Objective:  BP 123/72 (BP Location: Right Arm, Patient Position: Sitting, Cuff Size: Large)   Pulse (!) 50   Temp (!) 97.5 F (36.4 C) (Oral)   Resp 18   Ht 5\' 7"  (1.702 m)   Wt 231 lb (104.8 kg)   SpO2 99%   BMI 36.18 kg/m   BP/Weight 08/17/2017 06/12/2017 12/25/7508  Systolic BP 258 527 782  Diastolic BP 72 79 84  Wt. (Lbs) 231 235 237  BMI 36.18 36.81 37.12      Physical Exam  Constitutional: He is oriented to person, place, and time. He appears well-developed and well-nourished.  Neck: No JVD present.  Cardiovascular: Normal rate, normal heart sounds and intact distal pulses.   No murmur heard. Pulmonary/Chest: Effort normal and breath sounds normal. He has no wheezes. He has no rales. He exhibits no tenderness.  Abdominal: Soft. Bowel sounds are normal. He exhibits no distension and no mass. There is no tenderness.  Musculoskeletal: Normal range of motion.  Neurological: He is alert and oriented to person, place, and time.  Skin: Skin is warm and dry.  Psychiatric: He has a normal mood and affect.     Assessment & Plan:   1. Essential hypertension, benign Controlled Continue medications Low-sodium, DASH diet  2.  Chronic diastolic congestive heart failure ([place]) EF of 50-55% on 2-D echo 11/2015 Euvolemic Chief appointment with cardiologist  3. Atrial fibrillation, unspecified type ([place]) Currently in sinus rhythm Continue amiodarone and metoprolol  4. Mild intermittent asthma without complications/COPD Stable - Fluticasone-Salmeterol (ADVAIR DISKUS) 250-50 MCG/DOSE AEPB; Inhale 1 puff into the lungs 2 (two) times daily.  Dispense: 180 each; Refill: 0 - albuterol (PROVENTIL HFA;VENTOLIN HFA) 108 (90 Base) MCG/ACT inhaler; Inhale 2 puffs into the lungs every 6 (six) hours as needed.  Dispense: 1 Inhaler; Refill: 5 - albuterol (PROVENTIL) (2.5 MG/3ML) 0.083% nebulizer solution; Take 3 mLs (2.5 mg total) by nebulization every 6 (six) hours as needed for wheezing or shortness of breath.  Dispense: 150 mL; Refill: 1  5. Seizures (HCC) No recent features  6. Hypogonadism male Currently on tamoxifen on  Clomid Followed by endocrine   Meds ordered this encounter  Medications  . Fluticasone-Salmeterol (ADVAIR DISKUS) 250-50 MCG/DOSE AEPB    Sig: Inhale 1 puff into the lungs 2 (two) times daily.    Dispense:  180 each    Refill:  0  . albuterol (PROVENTIL HFA;VENTOLIN HFA) 108 (90 Base) MCG/ACT inhaler    Sig: Inhale 2 puffs into the lungs every 6 (six) hours as needed.    Dispense:  1 Inhaler    Refill:  5  . albuterol (PROVENTIL) (2.5 MG/3ML) 0.083% nebulizer solution    Sig: Take 3 mLs (2.5 mg total) by nebulization every 6 (six) hours as needed for wheezing or shortness of breath.    Dispense:  150 mL    Refill:  1    Follow-up: Return in about 3 months (around 11/17/2017) for Follow-up of chronic medical conditions.   Arnoldo Morale MD

## 2017-08-17 NOTE — Patient Instructions (Signed)

## 2017-08-19 ENCOUNTER — Other Ambulatory Visit: Payer: Self-pay | Admitting: Endocrinology

## 2017-08-21 ENCOUNTER — Other Ambulatory Visit: Payer: Self-pay

## 2017-08-21 NOTE — Patient Outreach (Signed)
[facility]) Care Management  [place]  08/21/2017   [name] [name] 19-Jun-1951 [id number]  Subjective: Telephone call to patient for case closure. Patient reports doing fine and has lost some weight.  He states he is exercising and trying not to over do it.  Discussed with patient case closure as he has met goals and is managing well.  He is in agreement.  Reminded patient of health coach contact and 24 hour line.  He verbalized understanding. Objective:   Encounter Medications:  Outpatient Encounter Prescriptions as of 08/21/2017  Medication Sig Note  . albuterol (PROVENTIL HFA;VENTOLIN HFA) 108 (90 Base) MCG/ACT inhaler Inhale 2 puffs into the lungs every 6 (six) hours as needed.   Marland Kitchen albuterol (PROVENTIL) (2.5 MG/3ML) 0.083% nebulizer solution Take 3 mLs (2.5 mg total) by nebulization every 6 (six) hours as needed for wheezing or shortness of breath.   Marland Kitchen amiodarone (PACERONE) 200 MG tablet Take 1 tablet (200 mg total) by mouth 2 (two) times daily.   Marland Kitchen aspirin EC 81 MG tablet Take 81 mg by mouth daily.     Marland Kitchen atorvastatin (LIPITOR) 40 MG tablet Take 1 tablet (40 mg total) by mouth daily at 6 PM.   . clomiPHENE (CLOMID) 50 MG tablet 1/4 tab daily   . clotrimazole-betamethasone (LOTRISONE) cream Apply 1 application topically 2 (two) times daily. (Patient taking differently: Apply 1 application topically 2 (two) times daily as needed (FOR RASH). )   . colchicine 0.6 MG tablet Take 1 tablet (0.6 mg total) by mouth 2 (two) times daily.   . digoxin (LANOXIN) 0.125 MG tablet Take 1 tablet (0.125 mg total) by mouth daily.   . ferrous sulfate 325 (65 FE) MG tablet Take 325 mg by mouth daily with breakfast. Reported on 03/16/2016 02/07/2016: Will pick it up over the counter per patient.  . Fluticasone-Salmeterol (ADVAIR DISKUS) 250-50 MCG/DOSE AEPB Inhale 1 puff into the lungs 2 (two) times daily.   . furosemide (LASIX) 20 MG tablet TAKE 1 TABLET (20 MG TOTAL) BY MOUTH DAILY.    Marland Kitchen lisinopril (PRINIVIL,ZESTRIL) 20 MG tablet Take 20 mg by mouth daily.    . metoprolol succinate (TOPROL-XL) 25 MG 24 hr tablet Take 25 mg by mouth daily. 12/27/2016: Received from: External Pharmacy Received Sig: TAKE 1 TABLET BY MOUTH EVERY DAY  . nitroGLYCERIN (NITROSTAT) 0.4 MG SL tablet Place 1 tablet (0.4 mg total) under the tongue every 5 (five) minutes as needed for chest pain.   . pantoprazole (PROTONIX) 40 MG tablet Take 40 mg by mouth daily.  03/08/2016: Received from: External Pharmacy  . sildenafil (REVATIO) 20 MG tablet 2-5 pills as needed for ED symptoms   . tamoxifen (NOLVADEX) 10 MG tablet Take 1 tablet (10 mg total) by mouth daily.   . tamsulosin (FLOMAX) 0.4 MG CAPS capsule Take 1 capsule (0.4 mg total) by mouth daily.    No facility-administered encounter medications on file as of 08/21/2017.     Functional Status:  No flowsheet data found.  Fall/Depression Screening: Fall Risk  08/21/2017 08/17/2017 07/18/2017  Falls in the past year? No No No  Risk for fall due to : - - -   PHQ 2/9 Scores 08/21/2017 08/17/2017 07/18/2017 06/05/2017 05/03/2017 03/07/2017 02/08/2017  PHQ - 2 Score 0 0 0 0 0 0 0  PHQ- 9 Score - 5 - - - - -    Assessment: Patient doing well and has met goals of care.   Plan:  [facility] CM  Care Plan Problem One     Most Recent Value  Care Plan Problem One  Heart failure knowledge deficit  Role Documenting the Problem One  [place] for Problem One  Active  THN Long Term Goal   Patient will be able to identify heart failure zones within 90 days.    [facility] Long Term Goal Start Date  07/18/17  [facility] Long Term Goal Met Date  08/21/17  Interventions for Problem One Long Term Goal  RN Health Coach discussed with patient heart failure zones and when to notify physician.       RN Health Coach will close case and notify physician.   RN Health Coach will notify care management assistant of case closure.   Jone Baseman, RN, MSN [place] [phone]

## 2017-08-26 IMAGING — DX DG CHEST 2V
2 series · 2 of 2 positions shown · non-contrast
Comparison: 12/09/2015 and earlier.

ADDENDUM:
Study discussed by telephone with Dr. EMILIO ALBERTO SAPUY on 12/13/2015 at
3112 hours.
CLINICAL DATA: 64-year-old male with shortness of breath and
wheezing today. Initial encounter.

EXAM:
CHEST  2 VIEW

[w chest pa]
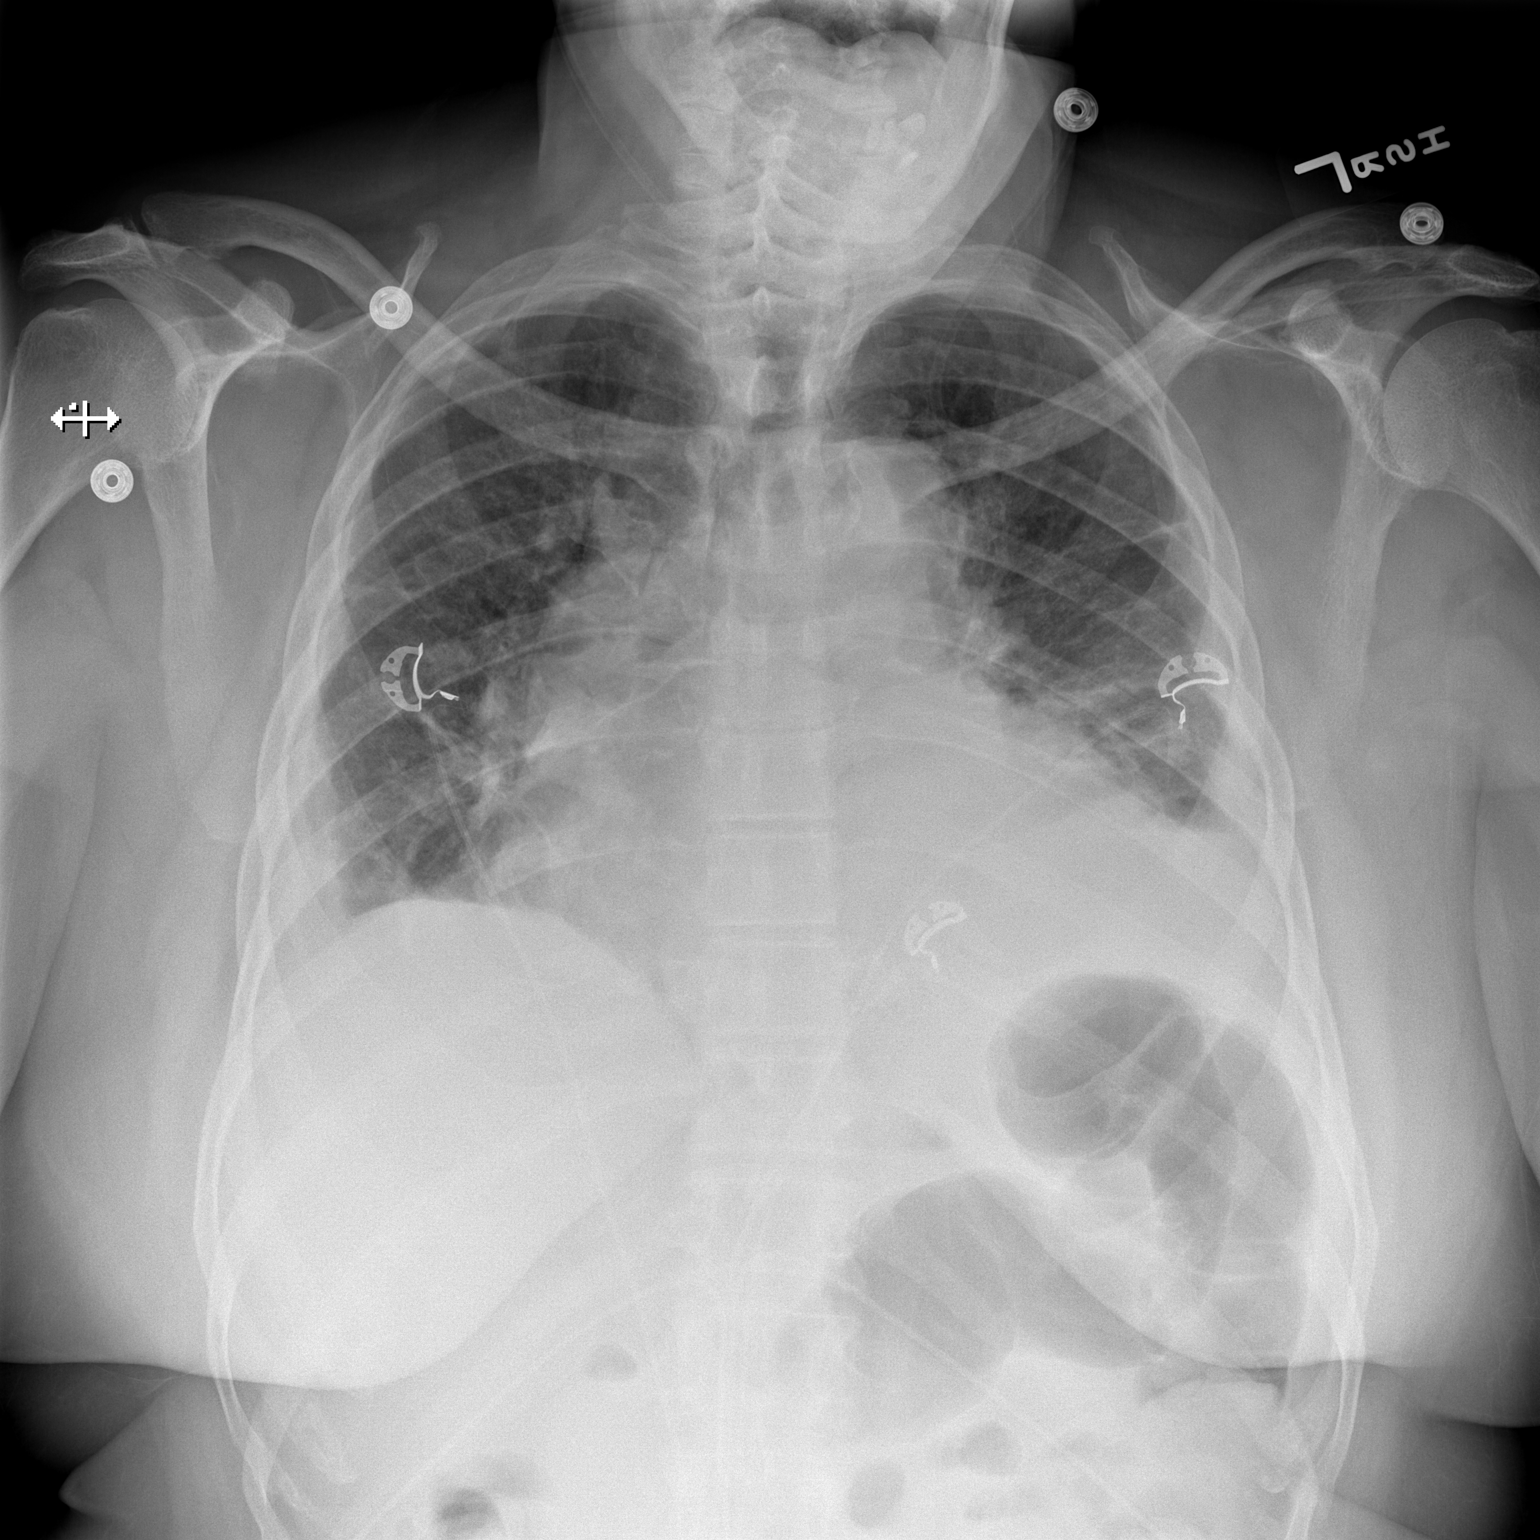

[w chest lat]
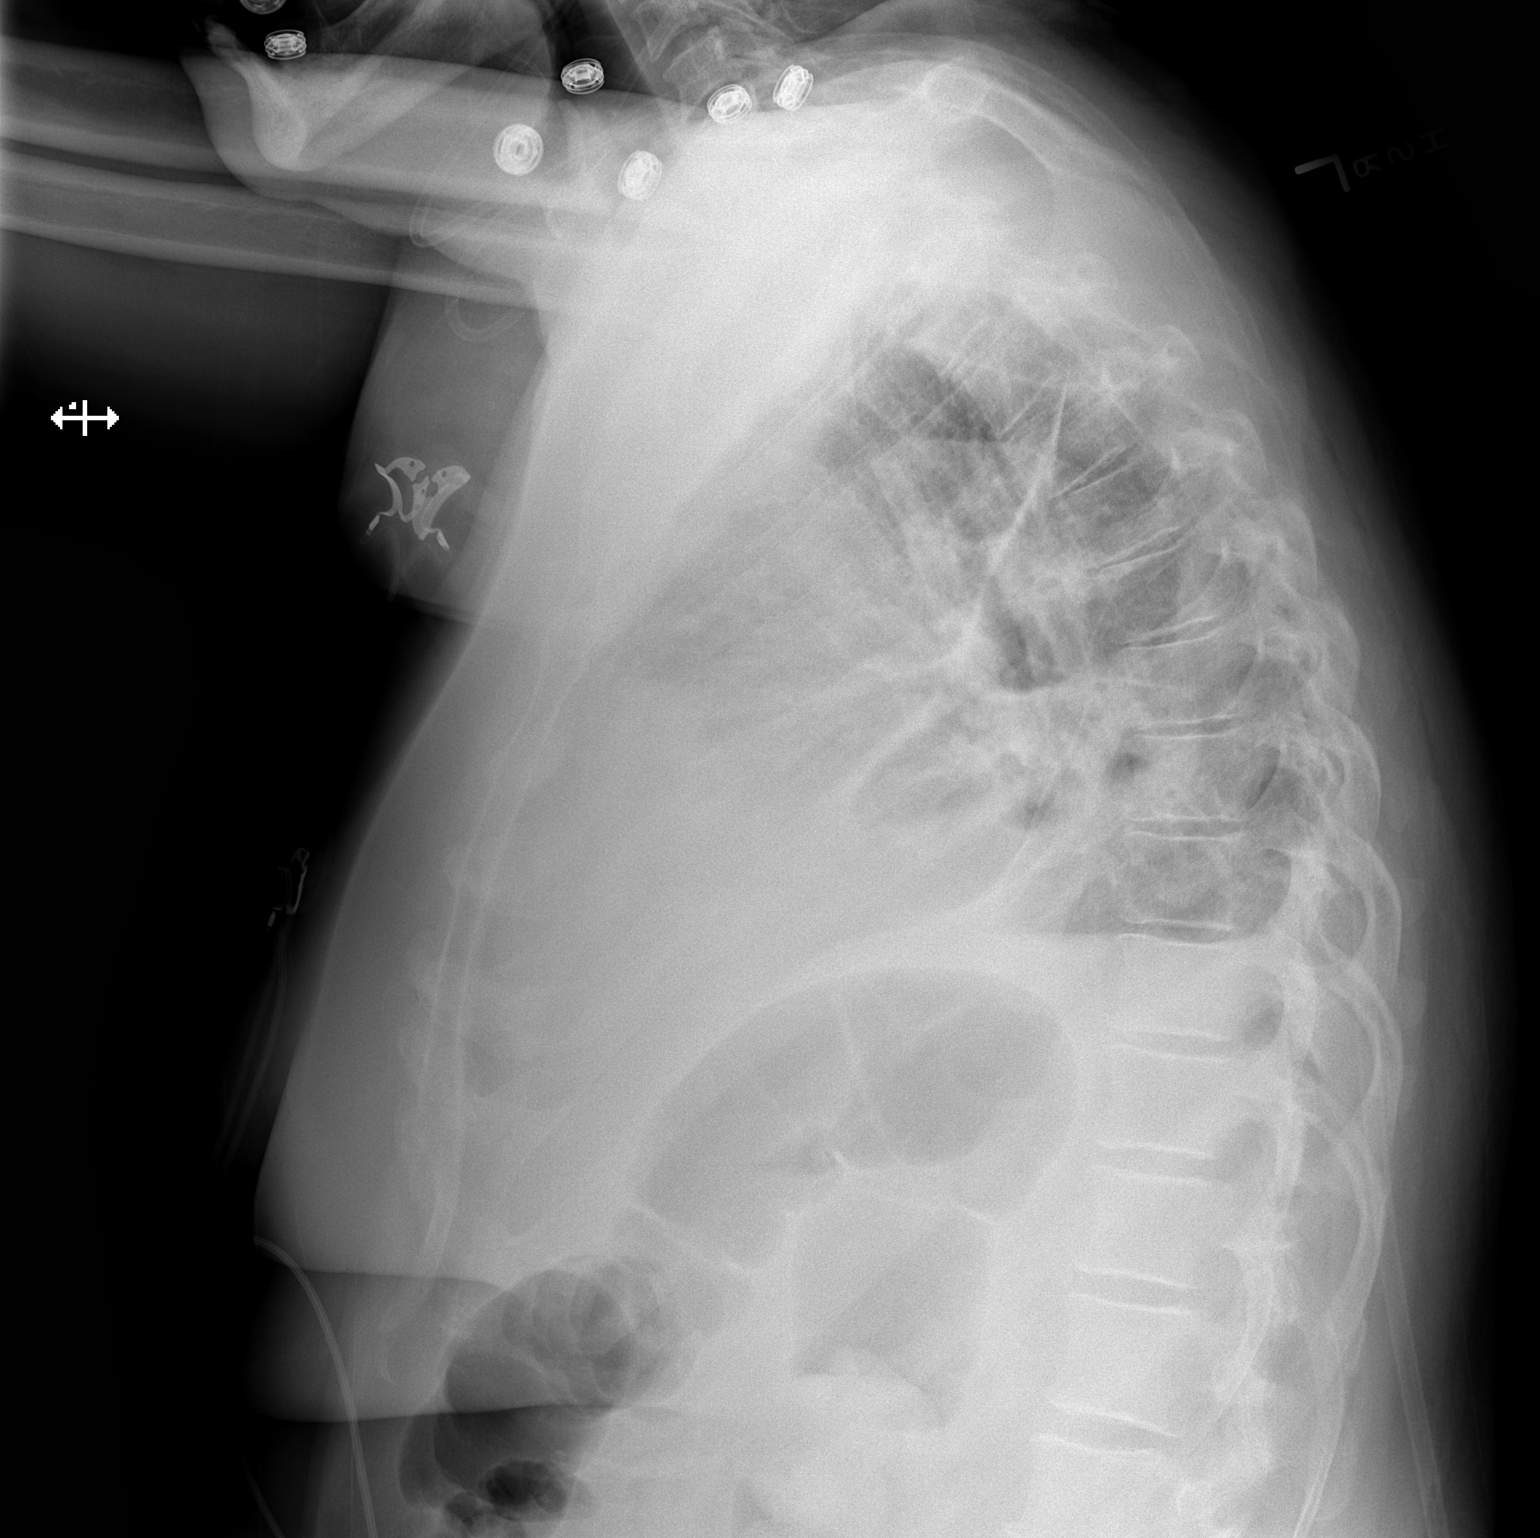

[2 of 2 positions shown; findings below may reference images not displayed]

FINDINGS: Enlargement of the cardiac silhouette appears to be new since 4206.
Moderate to severe cardiomegaly at this time, and appears increased
since 11/29/2015. Continued low lung volumes. Ongoing poor
ventilation at the left lung base. Bilateral small pleural effusions
suspected. No pneumothorax. No overt pulmonary edema. Linear
perihilar markings greater on the left have not significantly
changed since 12/09/2015. No acute osseous abnormality identified.
Negative visible bowel gas pattern.
IMPRESSION: 1. Enlarged cardiac silhouette appears to be new since 4206 and has
progressed in the past 2 weeks. Consider acute pericardial effusion.
2. Low lung volumes with bilateral pleural effusions. Dense
superimposed consolidation or atelectasis at the left lung base.
Superimposed increased bilateral perihilar opacity most resembles
atelectasis.

## 2017-08-26 IMAGING — CT CT CHEST W/ CM
2 of 3 series · 15 of 36 positions shown, 18 images · IV contrast (Omni 300)
Comparison: Chest radiographs dated 12/13/2015

CLINICAL DATA: Extreme shortness of breath, wheezing

EXAM:
CT CHEST WITH CONTRAST
TECHNIQUE: Multidetector CT imaging of the chest was performed during
intravenous contrast administration.
CONTRAST:  75mL OMNIPAQUE IOHEXOL 300 MG/ML  SOLN

[Series 2: chest with 5mm st · axial · 0.66mm/px · z∈[-572,-362]mm · 12 of 50 slices shown, 15 images]
[im 4/50  mediastinal]
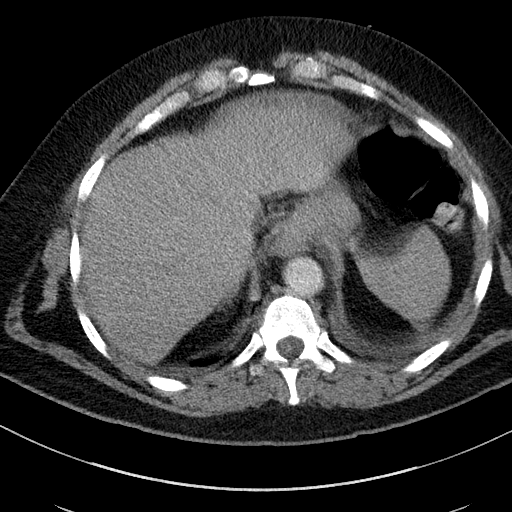
[im 4/50  lung]
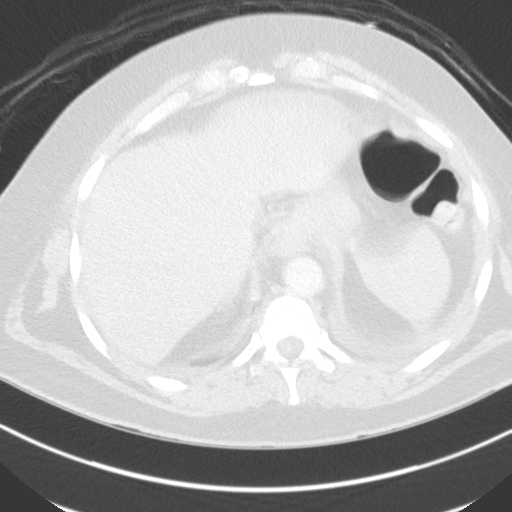
[im 8/50  lung]
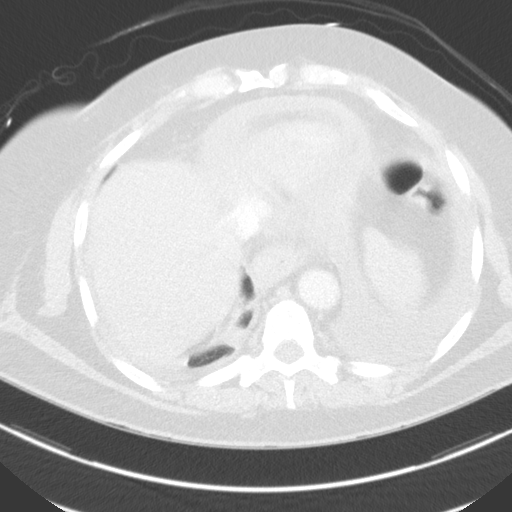
[im 11/50  lung]
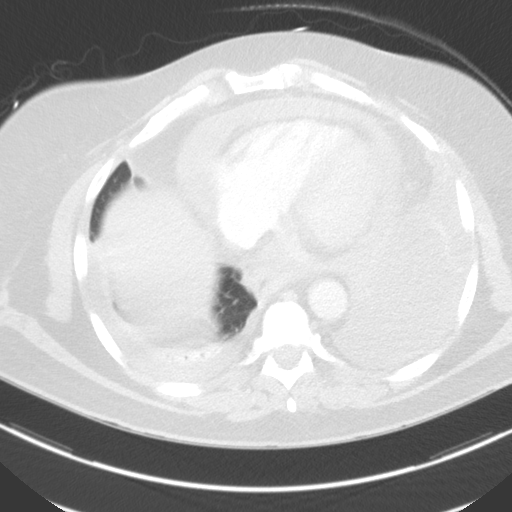
[im 15/50  lung]
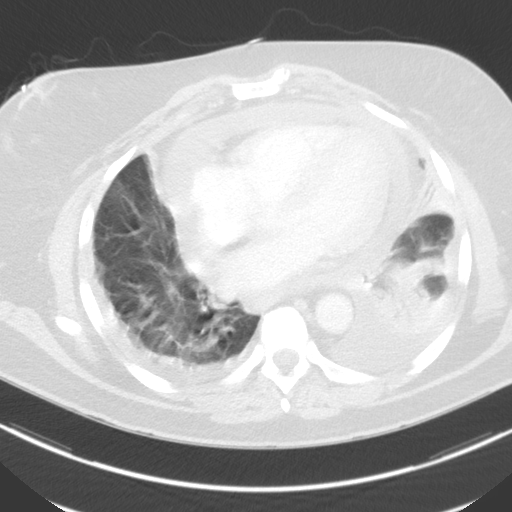
[im 19/50  mediastinal]
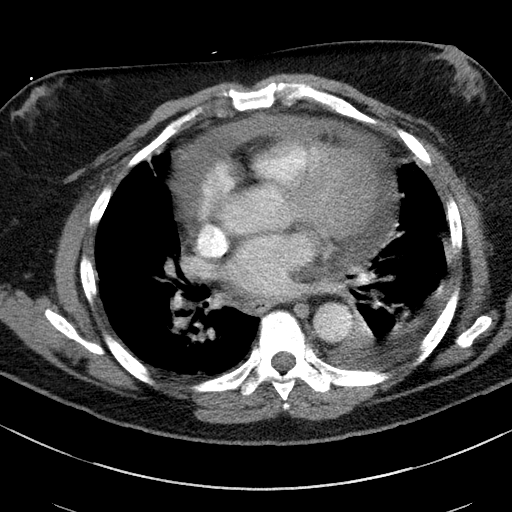
[im 19/50  lung]
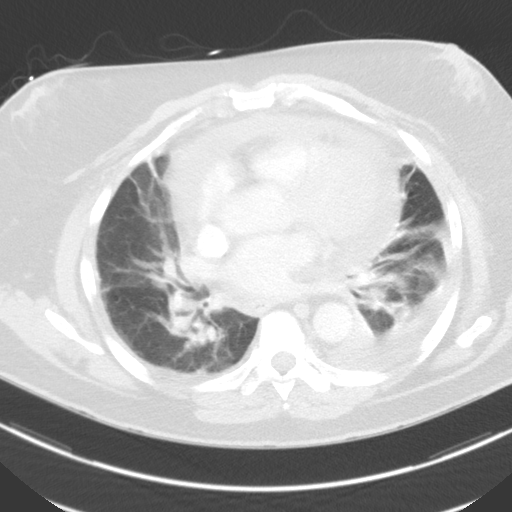
[im 22/50  lung]
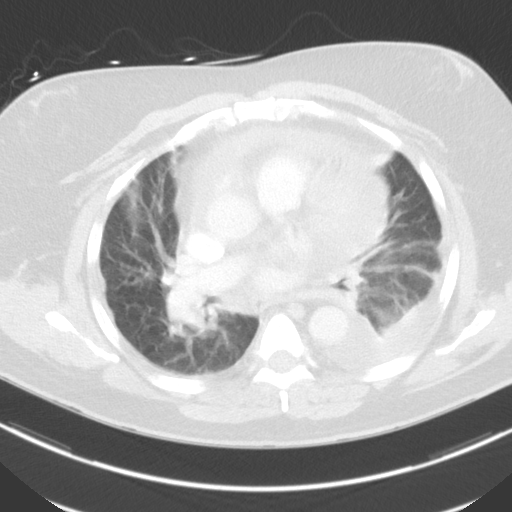
[im 28/50  lung]
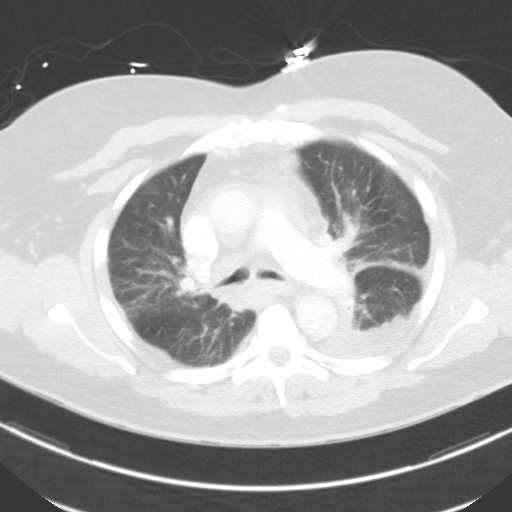
[im 31/50  lung]
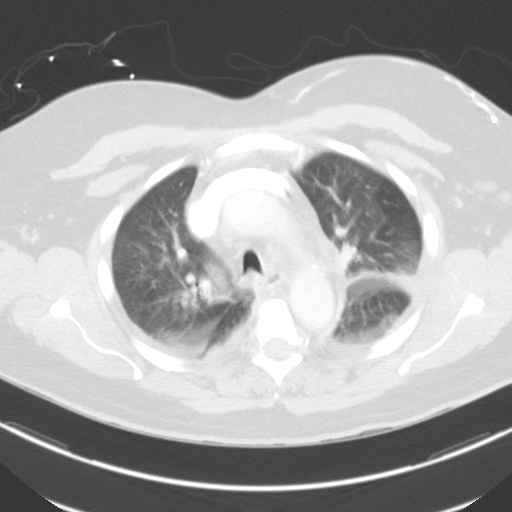
[im 35/50  mediastinal]
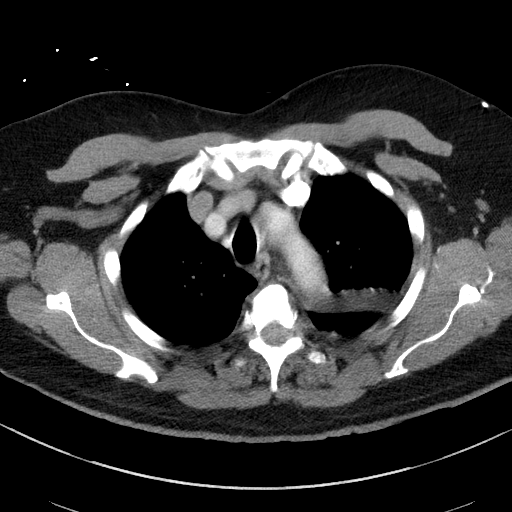
[im 35/50  lung]
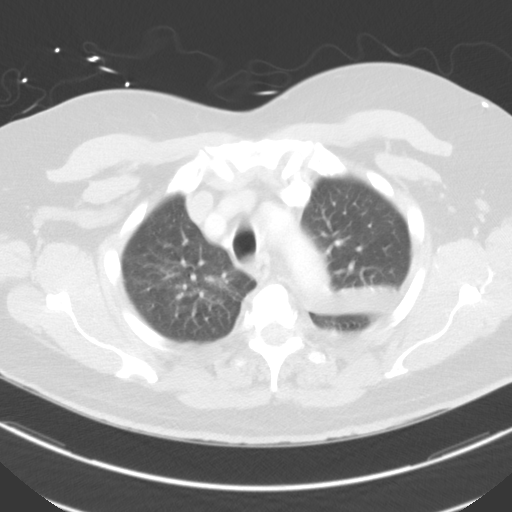
[im 39/50  lung]
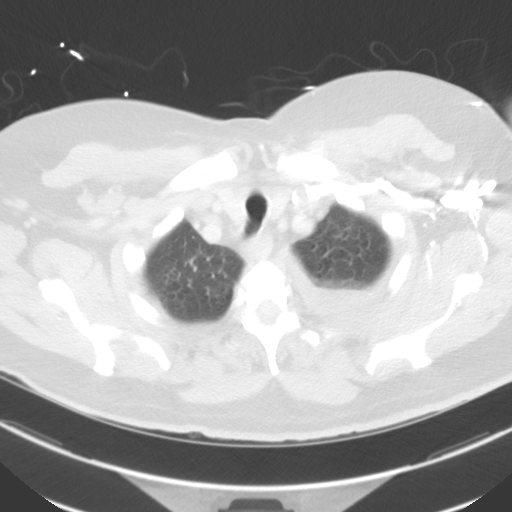
[im 42/50  lung]
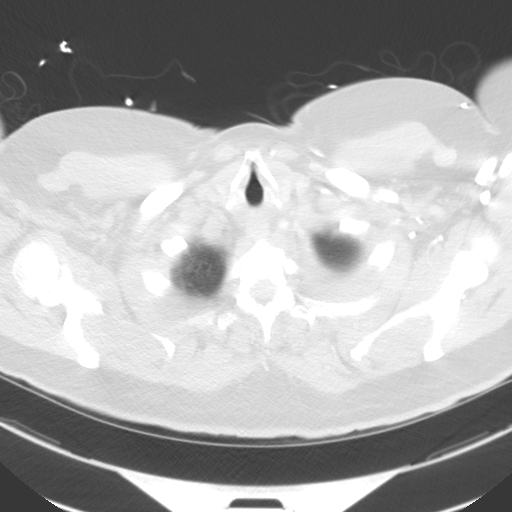
[im 46/50  lung]
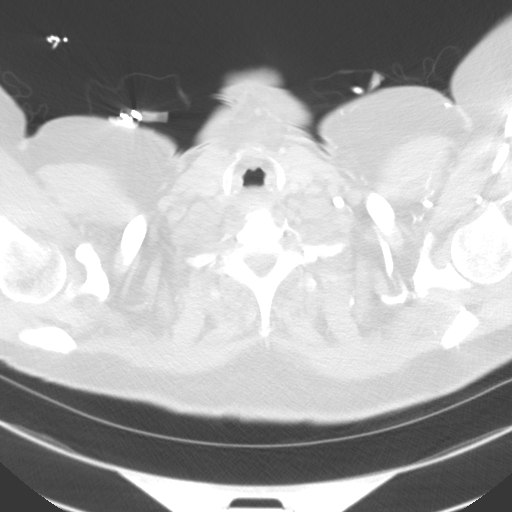

[Series 5: chest with 3mm st cor · coronal · 0.51mm/px · 3 of 81 slices shown]
[im 17/81  lung]
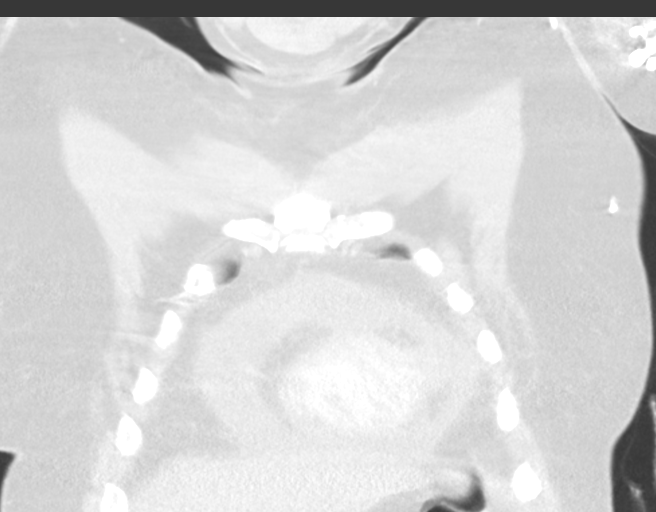
[im 33/81  lung]
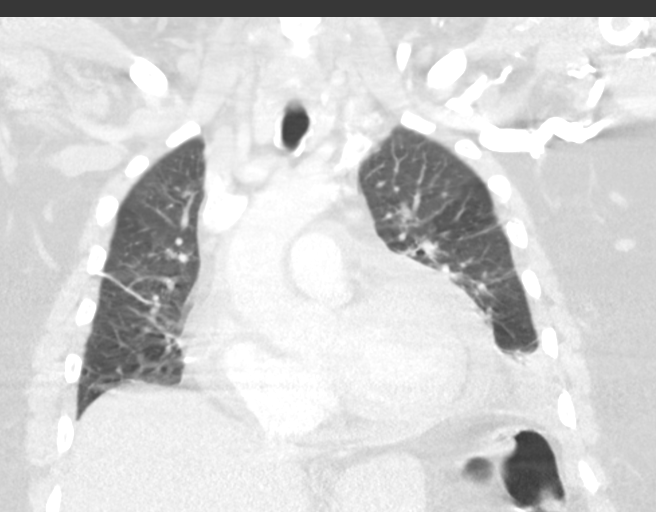
[im 49/81  lung]
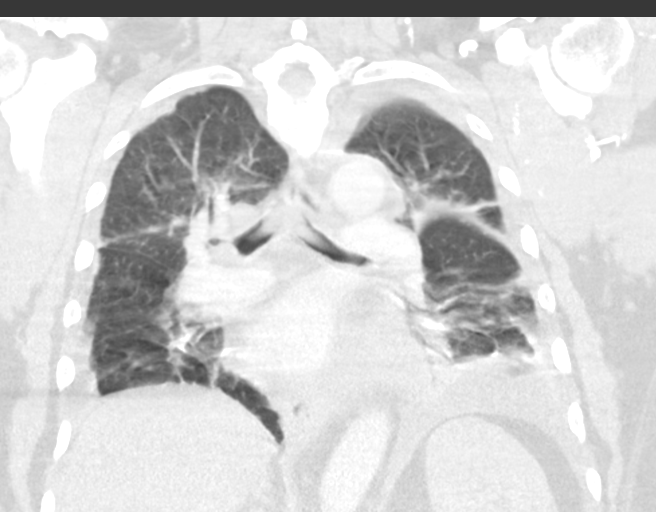

[15 of 36 positions shown; findings below may reference images not displayed]

FINDINGS: Mediastinum/Nodes: Mild cardiomegaly. Moderate pericardial effusion.

Small mediastinal lymph nodes measuring up to 9 mm (series 2/ image
25), favored to be reactive.

Visualized thyroid is unremarkable.

Lungs/Pleura: Evaluation of the lung parenchyma is constrained by
respiratory motion.

Small to moderate left pleural effusion. Small right pleural
effusion.

Associated bilateral lower lobe patchy opacities, likely compressive
atelectasis.

No suspicious pulmonary nodules.

Mild interstitial edema is suspected.

No pneumothorax.

Upper abdomen: Visualized upper abdomen is notable for a small
hiatal hernia.

Musculoskeletal: Degenerative changes of the visualized
thoracolumbar spine.
IMPRESSION: Moderate pericardial effusion.

Suspected mild interstitial edema. Small to moderate left and small
right pleural effusions.

Patchy bilateral lower lobe opacities, likely compressive
atelectasis.

## 2017-08-31 ENCOUNTER — Other Ambulatory Visit: Payer: Self-pay | Admitting: Endocrinology

## 2017-09-24 ENCOUNTER — Ambulatory Visit: Payer: Medicare HMO | Admitting: Internal Medicine

## 2017-10-15 ENCOUNTER — Ambulatory Visit: Payer: Medicare HMO | Admitting: Internal Medicine

## 2017-10-18 DIAGNOSIS — E6609 Other obesity due to excess calories: Secondary | ICD-10-CM | POA: Diagnosis not present

## 2017-10-18 DIAGNOSIS — J45909 Unspecified asthma, uncomplicated: Secondary | ICD-10-CM | POA: Diagnosis not present

## 2017-10-18 DIAGNOSIS — Z6836 Body mass index (BMI) 36.0-36.9, adult: Secondary | ICD-10-CM | POA: Diagnosis not present

## 2017-10-18 DIAGNOSIS — K219 Gastro-esophageal reflux disease without esophagitis: Secondary | ICD-10-CM | POA: Diagnosis not present

## 2017-10-18 DIAGNOSIS — D649 Anemia, unspecified: Secondary | ICD-10-CM | POA: Diagnosis not present

## 2017-10-18 DIAGNOSIS — I10 Essential (primary) hypertension: Secondary | ICD-10-CM | POA: Diagnosis not present

## 2017-10-18 DIAGNOSIS — I2510 Atherosclerotic heart disease of native coronary artery without angina pectoris: Secondary | ICD-10-CM | POA: Diagnosis not present

## 2017-10-18 DIAGNOSIS — E785 Hyperlipidemia, unspecified: Secondary | ICD-10-CM | POA: Diagnosis not present

## 2017-10-29 ENCOUNTER — Ambulatory Visit: Payer: Medicare HMO | Admitting: Internal Medicine

## 2017-11-13 ENCOUNTER — Encounter: Payer: Self-pay | Admitting: Internal Medicine

## 2017-11-13 ENCOUNTER — Telehealth: Payer: Self-pay | Admitting: Internal Medicine

## 2017-11-13 ENCOUNTER — Ambulatory Visit (INDEPENDENT_AMBULATORY_CARE_PROVIDER_SITE_OTHER): Payer: Medicare HMO | Admitting: Internal Medicine

## 2017-11-13 VITALS — BP 126/80 | HR 67 | Ht 67.0 in | Wt 233.6 lb

## 2017-11-13 DIAGNOSIS — J4540 Moderate persistent asthma, uncomplicated: Secondary | ICD-10-CM

## 2017-11-13 DIAGNOSIS — R0600 Dyspnea, unspecified: Secondary | ICD-10-CM | POA: Diagnosis not present

## 2017-11-13 DIAGNOSIS — R0689 Other abnormalities of breathing: Secondary | ICD-10-CM | POA: Diagnosis not present

## 2017-11-13 MED ORDER — FLUTICASONE FUROATE 100 MCG/ACT IN AEPB: 1.0000 | INHALATION_SPRAY | Freq: Every day | RESPIRATORY_TRACT | 3 refills | Status: DC

## 2017-11-13 NOTE — Addendum Note (Signed)
Addended by: Elton Sin on: 11/13/2017 12:07 PM   Modules accepted: Orders

## 2017-11-13 NOTE — Telephone Encounter (Signed)
Called Dr. Zenia Resides office to make them aware of this and was told to fax this information over to their office.  Nothing further is needed.

## 2017-11-13 NOTE — Patient Instructions (Signed)
ICD-10-CM   1. Dyspnea and respiratory abnormality R06.00    R06.89   2. Moderate persistent asthma, uncomplicated O88.75     Glad you are stable Respect refusal of flu shot we will document as such  Plan -Stop Advair -Instead start inhaled steroid arnuity and we can see if this slimming down of inhalers can still continue to give you benefit - we will talk to her cardiologist and make sure that you are not on lisinopril anymore   Follow-up -6 months or sooner if needed; exam nitric oxide at follow-up -0 return sooner if needed

## 2017-11-13 NOTE — Progress Notes (Signed)
Subjective:     Patient ID: [name] [name], male   DOB: [birth year], 66 y.o.   MRN: [id number]  HPI   79 never smoker seen for pulmonary consult during hospitalization 11/2015 for LLL HCAP and Pleuropericarditis   01/24/2016 [facility] follow up  Pt was admitted to hospital with chest pain. Went to cath labs. Had A Fib w/ RVR post procedure.  CT chest 12/20 showed moderate pericardial effusion . , small to mod left and small right pleural effusion and patchy bilateral lower lobe opacities. 2 D echo showed mild pericardial effusion with no evidence of tamponade. Autoimmune/Connective tissue disorder workup w/ repeat Rheumatoid factor neg.  Neg ANA . Scleroderma NEG . 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 Marland Kitchen  He was treated with IV abx .  He was felt to have a pleuropericarditis ? Viral related. He improved with stress dose steroids and colchicine .  He is feeling better since discharge.  Decreased cough and dyspnea.  Still has not regained energy level.  CXR today shows improvement with residual basilar pleural thickening ? Scarring noted. Effusions resolved.     OV 05/03/2016  Chief Complaint  Patient presents with  . Follow-up    PFT results. pt states breathing is baseline since last OV. c/o sob & wheezing. denies cough or cp/tightness.      Follow-up dyspnea in the setting of long history of asthma  66 year-old morbidly obese male. He tells me and I'm seeing him for the first time. That he has a lifelong history of asthma. For the last few to several years is progressive dyspnea on exertion. In October 2016 and perhaps in December 2016 he had repeat admissions for hospital-acquired pneumonia and pericardial effusion based on review of the chart and according to his history. He says he was diuresed. He denies having a pericardiocentesis. It appears the pericardial effusion was moderate in size on a CT chest but a follow-up echo a day later in December 2016 showed that it was only small and did not have  tamponade physiology. Since then he's better but he says he set a new low baseline with class 3 dyspnea on exertion relieved by rest. Is also associated with fatigue. He believes his asthma is under control but he is taking lisinopril added by his cardiologist.  Extensive autoimmune antibody panel October 2015 shows trace positive ANA and trace positive double-stranded DNA. Repeat in December 2016 continues to show trace positive double-stranded DNA but ANA was negative.  CT scan of the chest December 13 2015 shows moderate pericardial effusion and some mild bilateral pleural effusions and compressive atelectasis I personally visualized this. Echocardiogram 12/14/2015 that followed this showed only trace pericardial effusion without any tamponade physiology CXR Jan 2017  - clear per reprot    Pulmonary function test 03/29/2016 shows FEV1 1.87 L/72% FVC 2.42 L/72%. Both of post bronchodilator response. Ratio 77. TLC 4.4 L/69% - this is all consistent with restriction and his significant obesity. His DLCO is 23.8 and normal   Exhaled nitric oxide today is 35 ppb and in the gray zone. This is done while on Advair. He does have some wheezing on the right side in the base.    OV 08/07/2016  Chief Complaint  Patient presents with  . Follow-up    Pt c/o increased in cough, SOB and wheeze with exertion. Pt reports that when he is not active he does very well. Pt is still doing rehab 2 days a week.    Follow-up moderate  persistent asthma in the setting of morbid obesity, diastolic dysfunction, history of pericardial effusion with double-stranded DNA positivity, physical deconditioning and ACE inhibitor intake  Last seen May 2017. At that time referred to pulmonary rehabilitation. Since then dyspnea is improved from a scale of 7/10-> 4/10. He still feels he has residual dyspnea needs to improve further. He also tells me in the last 3 months he is on Advair and his asthma has been well controlled with  use of albuterol and no wheezing or shortness of breath or asthma exacerbation episodes. In fact on exam today is not wheezing. He does have a history of pericardial effusion and fluctuating ANA positivity and persistent double-stranded DNA positivity. We referred him to rheumatology but rather not sure if this referral went to. Patient himself is not sure. Also of note he is on ACE inhibitor. We recommended he talk to cardiologist Dr. Terrence Dupont and stop this but he still on it.   OV 01/24/2017  Chief Complaint  Patient presents with  . Follow-up    Pt states his breathing is unchanged since last OV. Pt c/o recent hoarseness. Pt denies cough and CP/tightness.     Follow-up moderate persistent asthma in the setting of morbid obesity, diastolic dysfunction, history of pericardial effusion with double-stranded DNA positivity, physical deconditioning and ACE inhibitor intake  Last seen August 2017. Since then he has een doing daily exercises. His dyspnea though is stable at level VI/X. He feels this is largely due to his obesity. He is asking for some dietary advice. Asthma itself is stable with the help of Advair. He says he has ongoing wheezing on-and-off. He still on lisinopril. He still not come off it even though you bend his cardiologist in the interim. REview  of the old chart shows that he visited Dr. D rheumatologist on 12/27/2016: Summary is that he does not have any autoimmune features clinically and she is placed him on expectant follow-up    OV 11/13/2017  Chief Complaint  Patient presents with  . Follow-up    Pt states that he has been doing okay. States that he has always had SOB. Denies any cough or CP.    Follow-up moderate persistent asthma in the setting of morbid obesity, diastolic dysfunction, history of pericardial effusion with double-stranded DNA positivity, physical deconditioning and ACE inhibitor intake    [name] [name] returns for follow-up of his dyspnea.  Overall  stable.  Dyspnea is only mild but still persistent.  Present on exertion relieved by rest.  In terms of his asthma he is on Advair.  We do not know if he is still on lisinopril or not.  He told my nurse that he is still taking the lisinopril.  But he told me that he is off the lisinopril and his cardiologist Dr. Terrence Dupont stopped it.  He is also declining the flu shot.  He told the nurse that he is phobic for needles.  He told me that if he takes the flu shot he will get influenza.  No new issues no nocturnal awakening.  He is open to the concept of having to taper down his inhaled steroid.  Currently he states he is compliant with his Advair  Walking desaturation test on 11/13/2017 185 feet on RA with forehead probe x 3 laps:  did NOT desaturate. Rest pulse ox was 100%, final pulse ox was 100%. HR response 63/min at rest to 81/min at peak exertion.   Results for [name] [name], [name] [name] (MRN [id number]) as of  11/13/2017 11:52  Ref. Range 05/03/2016 12:24  Nitric Oxide Unknown 35   Results for [name] [name], [name] [name] (MRN [id number]) as of 11/13/2017 11:52  Ref. Range 12/16/2015 11:55 01/24/2016 11:19 05/03/2016 12:26  ds DNA Ab Latest Units: IU/mL 15 (H) 6 (H) 8 (H)   Last cxr 2017 - clear  Echo last 2016- ef 55%  lst pft 2017 - normal dlco, fv 72% - obese   has a past medical history of Anemia, Asthma, Atrial fibrillation ([place]), CHF (congestive heart failure) ([place]), COPD (chronic obstructive pulmonary disease) ([place]), Heart disease, and Hypertension.   reports that  has never smoked. he has never used smokeless tobacco.  Past Surgical History:  Procedure Laterality Date  . ESOPHAGUS SURGERY    . Left Heart Cath and Coronary Angiography N/A 12/08/2015   Performed by Charolette Forward, MD at [facility]    No Known Allergies  Immunization History  Administered Date(s) Administered  . Influenza,inj,Quad PF,6+ Mos 12/09/2015    Family History  Problem Relation Age of Onset  . Cancer Mother       Current Outpatient Medications:  .  albuterol (PROVENTIL HFA;VENTOLIN HFA) 108 (90 Base) MCG/ACT inhaler, Inhale 2 puffs into the lungs every 6 (six) hours as needed., Disp: 1 Inhaler, Rfl: 5 .  albuterol (PROVENTIL) (2.5 MG/3ML) 0.083% nebulizer solution, Take 3 mLs (2.5 mg total) by nebulization every 6 (six) hours as needed for wheezing or shortness of breath., Disp: 150 mL, Rfl: 1 .  amiodarone (PACERONE) 200 MG tablet, Take 1 tablet (200 mg total) by mouth 2 (two) times daily., Disp: 60 tablet, Rfl: 2 .  aspirin EC 81 MG tablet, Take 81 mg by mouth daily.  , Disp: , Rfl:  .  atorvastatin (LIPITOR) 40 MG tablet, Take 1 tablet (40 mg total) by mouth daily at 6 PM., Disp: 30 tablet, Rfl: 3 .  clomiPHENE (CLOMID) 50 MG tablet, 1/4 tab daily, Disp: 10 tablet, Rfl: 5 .  clotrimazole-betamethasone (LOTRISONE) cream, Apply 1 application topically 2 (two) times daily. (Patient taking differently: Apply 1 application topically 2 (two) times daily as needed (FOR RASH). ), Disp: 30 g, Rfl: 1 .  colchicine 0.6 MG tablet, Take 1 tablet (0.6 mg total) by mouth 2 (two) times daily., Disp: 60 tablet, Rfl: 3 .  digoxin (LANOXIN) 0.125 MG tablet, Take 1 tablet (0.125 mg total) by mouth daily., Disp: 30 tablet, Rfl: 3 .  ferrous sulfate 325 (65 FE) MG tablet, Take 325 mg by mouth daily with breakfast. Reported on 03/16/2016, Disp: , Rfl:  .  Fluticasone-Salmeterol (ADVAIR DISKUS) 250-50 MCG/DOSE AEPB, Inhale 1 puff into the lungs 2 (two) times daily., Disp: 180 each, Rfl: 0 .  furosemide (LASIX) 20 MG tablet, TAKE 1 TABLET (20 MG TOTAL) BY MOUTH DAILY., Disp: 30 tablet, Rfl: 0 .  lisinopril (PRINIVIL,ZESTRIL) 20 MG tablet, Take 20 mg by mouth daily. , Disp: , Rfl:  .  metoprolol succinate (TOPROL-XL) 25 MG 24 hr tablet, Take 25 mg by mouth daily., Disp: , Rfl: 3 .  nitroGLYCERIN (NITROSTAT) 0.4 MG SL tablet, Place 1 tablet (0.4 mg total) under the tongue every 5 (five) minutes as needed for chest pain., Disp:  25 tablet, Rfl: 1 .  pantoprazole (PROTONIX) 40 MG tablet, Take 40 mg by mouth daily. , Disp: , Rfl:  .  sildenafil (REVATIO) 20 MG tablet, 2-5 pills as needed for ED symptoms, Disp: 50 tablet, Rfl: 10 .  tamoxifen (NOLVADEX) 10 MG tablet, Take 1 tablet (  10 mg total) by mouth daily., Disp: 30 tablet, Rfl: 11 .  tamsulosin (FLOMAX) 0.4 MG CAPS capsule, Take 1 capsule (0.4 mg total) by mouth daily., Disp: 30 capsule, Rfl: 3    Review of Systems     Objective:   Physical Exam  Constitutional: He is oriented to person, place, and time. He appears well-developed and well-nourished. No distress.  Obese   HENT:  Head: Normocephalic and atraumatic.  Right Ear: External ear normal.  Left Ear: External ear normal.  Mouth/Throat: Oropharynx is clear and moist. No oropharyngeal exudate.  Eyes: Conjunctivae and EOM are normal. Pupils are equal, round, and reactive to light. Right eye exhibits no discharge. Left eye exhibits no discharge. No scleral icterus.  Neck: Normal range of motion. Neck supple. No JVD present. No tracheal deviation present. No thyromegaly present.  Cardiovascular: Normal rate, regular rhythm and intact distal pulses. Exam reveals no gallop and no friction rub.  No murmur heard. Pulmonary/Chest: Effort normal and breath sounds normal. No respiratory distress. He has no wheezes. He has no rales. He exhibits no tenderness.  Abdominal: Soft. Bowel sounds are normal. He exhibits no distension and no mass. There is no tenderness. There is no rebound and no guarding.  Musculoskeletal: Normal range of motion. He exhibits no edema or tenderness.  Lymphadenopathy:    He has no cervical adenopathy.  Neurological: He is alert and oriented to person, place, and time. He has normal reflexes. No cranial nerve deficit. Coordination normal.  Skin: Skin is warm and dry. No rash noted. He is not diaphoretic. No erythema. No pallor.  Psychiatric: He has a normal mood and affect. His behavior is  normal. Judgment and thought content normal.  Nursing note and vitals reviewed.  Vitals:   11/13/17 1124  BP: 126/80  Pulse: 67  SpO2: 99%  Weight: 233 lb 9.6 oz (106 kg)  Height: '5\' 7"'  (1.702 m)    Estimated body mass index is 36.59 kg/m as calculated from the following:   Height as of this encounter: '5\' 7"'  (1.702 m).   Weight as of this encounter: 233 lb 9.6 oz (106 kg).      Assessment:       ICD-10-CM   1. Dyspnea and respiratory abnormality R06.00    R06.89   2. Moderate persistent asthma, uncomplicated T46.56        Plan:      Glad you are stable Respect refusal of flu shot we will document as such  Plan -Stop Advair -Instead start inhaled steroid arnuity and we can see if this slimming down of inhalers can still continue to give you benefit - we will talk to her cardiologist and make sure that you are not on lisinopril anymore   Follow-up -6 months or sooner if needed; exam nitric oxide at follow-up -0 return sooner if needed    Dr. Brand Males, M.D., [facility].C.P Pulmonary and Critical Care Medicine Staff Physician [place] [facility] Pager: [phone], If no answer or between  15:00h - 7:00h: call [phone]  11/13/2017 12:01 PM

## 2017-11-13 NOTE — Telephone Encounter (Signed)
eMily  Can you please call Dr Terrence Dupont office and see if he is on ace inhibitor lisinopril. IF he is please have them stop it due to wheeze/asthma  THanks  Dr. Brand Males, M.D., [facility].C.P Pulmonary and Critical Care Medicine Staff Physician [place] [facility] Pager: [phone], If no answer or between  15:00h - 7:00h: call [phone]  11/13/2017 11:58 AM

## 2017-11-19 ENCOUNTER — Encounter: Payer: Self-pay | Admitting: Family Medicine

## 2017-11-19 ENCOUNTER — Ambulatory Visit: Payer: Medicare HMO | Attending: Family Medicine | Admitting: Family Medicine

## 2017-11-19 VITALS — BP 163/80 | HR 62 | Temp 98.0°F | Ht 67.0 in | Wt 231.6 lb

## 2017-11-19 DIAGNOSIS — N62 Hypertrophy of breast: Secondary | ICD-10-CM | POA: Diagnosis not present

## 2017-11-19 DIAGNOSIS — N400 Enlarged prostate without lower urinary tract symptoms: Secondary | ICD-10-CM | POA: Diagnosis not present

## 2017-11-19 DIAGNOSIS — I5032 Chronic diastolic (congestive) heart failure: Secondary | ICD-10-CM | POA: Diagnosis not present

## 2017-11-19 DIAGNOSIS — Z7982 Long term (current) use of aspirin: Secondary | ICD-10-CM | POA: Insufficient documentation

## 2017-11-19 DIAGNOSIS — J4520 Mild intermittent asthma, uncomplicated: Secondary | ICD-10-CM | POA: Insufficient documentation

## 2017-11-19 DIAGNOSIS — I4892 Unspecified atrial flutter: Secondary | ICD-10-CM | POA: Diagnosis not present

## 2017-11-19 DIAGNOSIS — E559 Vitamin D deficiency, unspecified: Secondary | ICD-10-CM | POA: Insufficient documentation

## 2017-11-19 DIAGNOSIS — K219 Gastro-esophageal reflux disease without esophagitis: Secondary | ICD-10-CM | POA: Diagnosis not present

## 2017-11-19 DIAGNOSIS — I2510 Atherosclerotic heart disease of native coronary artery without angina pectoris: Secondary | ICD-10-CM | POA: Diagnosis not present

## 2017-11-19 DIAGNOSIS — R5383 Other fatigue: Secondary | ICD-10-CM | POA: Diagnosis not present

## 2017-11-19 DIAGNOSIS — R569 Unspecified convulsions: Secondary | ICD-10-CM | POA: Diagnosis not present

## 2017-11-19 DIAGNOSIS — Z79899 Other long term (current) drug therapy: Secondary | ICD-10-CM | POA: Diagnosis not present

## 2017-11-19 DIAGNOSIS — J449 Chronic obstructive pulmonary disease, unspecified: Secondary | ICD-10-CM | POA: Diagnosis not present

## 2017-11-19 DIAGNOSIS — I4891 Unspecified atrial fibrillation: Secondary | ICD-10-CM

## 2017-11-19 DIAGNOSIS — I480 Paroxysmal atrial fibrillation: Secondary | ICD-10-CM | POA: Insufficient documentation

## 2017-11-19 DIAGNOSIS — I110 Hypertensive heart disease with heart failure: Secondary | ICD-10-CM | POA: Insufficient documentation

## 2017-11-19 DIAGNOSIS — R399 Unspecified symptoms and signs involving the genitourinary system: Secondary | ICD-10-CM | POA: Diagnosis not present

## 2017-11-19 DIAGNOSIS — E291 Testicular hypofunction: Secondary | ICD-10-CM | POA: Diagnosis not present

## 2017-11-19 MED ORDER — FUROSEMIDE 20 MG PO TABS: ORAL_TABLET | ORAL | 5 refills | Status: DC

## 2017-11-19 MED ORDER — METOPROLOL SUCCINATE ER 25 MG PO TB24: 25.0000 mg | ORAL_TABLET | Freq: Every day | ORAL | 5 refills | Status: DC

## 2017-11-19 MED ORDER — TAMSULOSIN HCL 0.4 MG PO CAPS: 0.4000 mg | ORAL_CAPSULE | Freq: Every day | ORAL | 5 refills | Status: DC

## 2017-11-19 MED ORDER — ALBUTEROL SULFATE HFA 108 (90 BASE) MCG/ACT IN AERS: 2.0000 | INHALATION_SPRAY | Freq: Four times a day (QID) | RESPIRATORY_TRACT | 5 refills | Status: AC | PRN

## 2017-11-19 MED ORDER — ATORVASTATIN CALCIUM 40 MG PO TABS: 40.0000 mg | ORAL_TABLET | Freq: Every day | ORAL | 5 refills | Status: DC

## 2017-11-19 MED ORDER — PANTOPRAZOLE SODIUM 40 MG PO TBEC: 40.0000 mg | DELAYED_RELEASE_TABLET | Freq: Every day | ORAL | 5 refills | Status: DC

## 2017-11-19 NOTE — Patient Instructions (Signed)

## 2017-11-19 NOTE — Progress Notes (Signed)
Subjective:  Patient ID: [name] [name], male    DOB: [birth year]  Age: 66 y.o. MRN: [id number]  CC: Hypertension   HPI MAURI TOLEN  is a 66 year old male with a history of paroxysmal A. fib, atrial flutter, diastolic congestive heart failure (EF 50-55% in 11/2015), CAD (status post cardiac cath in 11/2015 - mid LAD 20% stenosis, dist LAD 40% stenosis, Ost RCA to prox RCA 30% stenosis, normal LV systolic function), sick sinus syndrome, COPD, GERD, seizures (managed by neurology), BPH, hypogonadism (managed by endocrine Here for a follow-up visit.  He informs me his breathing is better but a bit labored when he exerts himself.  At his most recent visit with pulmonology, last week his  Advair was discontinued and he was commenced on [place] which he just picked up today. [facility] but informs me that he needs to improve his stamina without getting tired. Last vitamin D level in 2015 revealed vitamin D deficiency with a level of 11. He denies recent weight gain, pedal edema and has not been to see cardiology in a while.  He endorses compliance with his medications and denies adverse effects.  His last visit to endocrine was in 04/2017 and he has been compliant with his tamoxifen and Clomid.  Reflux symptoms, lower urinary tract symptoms are controlled on his current medications and he has no acute concerns today.  Past Medical History:  Diagnosis Date  . Anemia   . Asthma   . Atrial fibrillation ([place])   . CHF (congestive heart failure) ([place])   . COPD (chronic obstructive pulmonary disease) ([place])   . Heart disease   . Hypertension     Past Surgical History:  Procedure Laterality Date  . CARDIAC CATHETERIZATION N/A 12/08/2015   Procedure: Left Heart Cath and Coronary Angiography;  Surgeon: Charolette Forward, MD;  Location: [facility];  Service: Cardiovascular;  Laterality: N/A;  . ESOPHAGUS SURGERY      No Known Allergies   Outpatient Medications  Prior to Visit  Medication Sig Dispense Refill  . albuterol (PROVENTIL) (2.5 MG/3ML) 0.083% nebulizer solution Take 3 mLs (2.5 mg total) by nebulization every 6 (six) hours as needed for wheezing or shortness of breath. 150 mL 1  . amiodarone (PACERONE) 200 MG tablet Take 1 tablet (200 mg total) by mouth 2 (two) times daily. 60 tablet 2  . aspirin EC 81 MG tablet Take 81 mg by mouth daily.      . clomiPHENE (CLOMID) 50 MG tablet 1/4 tab daily 10 tablet 5  . clotrimazole-betamethasone (LOTRISONE) cream Apply 1 application topically 2 (two) times daily. (Patient taking differently: Apply 1 application topically 2 (two) times daily as needed (FOR RASH). ) 30 g 1  . colchicine 0.6 MG tablet Take 1 tablet (0.6 mg total) by mouth 2 (two) times daily. 60 tablet 3  . digoxin (LANOXIN) 0.125 MG tablet Take 1 tablet (0.125 mg total) by mouth daily. 30 tablet 3  . ferrous sulfate 325 (65 FE) MG tablet Take 325 mg by mouth daily with breakfast. Reported on 03/16/2016    . Fluticasone Furoate (ARNUITY ELLIPTA) 100 MCG/ACT AEPB Inhale 1 puff into the lungs daily. 30 each 3  . nitroGLYCERIN (NITROSTAT) 0.4 MG SL tablet Place 1 tablet (0.4 mg total) under the tongue every 5 (five) minutes as needed for chest pain. 25 tablet 1  . sildenafil (REVATIO) 20 MG tablet 2-5 pills as needed for ED symptoms 50 tablet 10  .  tamoxifen (NOLVADEX) 10 MG tablet Take 1 tablet (10 mg total) by mouth daily. 30 tablet 11  . albuterol (PROVENTIL HFA;VENTOLIN HFA) 108 (90 Base) MCG/ACT inhaler Inhale 2 puffs into the lungs every 6 (six) hours as needed. 1 Inhaler 5  . atorvastatin (LIPITOR) 40 MG tablet Take 1 tablet (40 mg total) by mouth daily at 6 PM. 30 tablet 3  . furosemide (LASIX) 20 MG tablet TAKE 1 TABLET (20 MG TOTAL) BY MOUTH DAILY. 30 tablet 0  . metoprolol succinate (TOPROL-XL) 25 MG 24 hr tablet Take 25 mg by mouth daily.  3  . pantoprazole (PROTONIX) 40 MG tablet Take 40 mg by mouth daily.     . tamsulosin (FLOMAX) 0.4  MG CAPS capsule Take 1 capsule (0.4 mg total) by mouth daily. 30 capsule 3   No facility-administered medications prior to visit.     ROS Review of Systems  Constitutional: Negative for activity change and appetite change.  HENT: Negative for sinus pressure and sore throat.   Eyes: Negative for visual disturbance.  Respiratory: Negative for cough, chest tightness and shortness of breath.   Cardiovascular: Negative for chest pain and leg swelling.  Gastrointestinal: Negative for abdominal distention, abdominal pain, constipation and diarrhea.  Endocrine: Negative.   Genitourinary: Negative for dysuria.  Musculoskeletal: Negative for joint swelling and myalgias.  Skin: Negative for rash.  Allergic/Immunologic: Negative.   Neurological: Negative for weakness, light-headedness and numbness.  Psychiatric/Behavioral: Negative for dysphoric mood and suicidal ideas.    Objective:  BP (!) 163/80   Pulse 62   Temp 98 F (36.7 C) (Oral)   Ht 5\' 7"  (1.702 m)   Wt 231 lb 9.6 oz (105.1 kg)   SpO2 100%   BMI 36.27 kg/m   BP/Weight 11/19/2017 11/13/2017 5/32/9924  Systolic BP 268 341 962  Diastolic BP 80 80 72  Wt. (Lbs) 231.6 233.6 231  BMI 36.27 36.59 36.18      Physical Exam  Constitutional: He is oriented to person, place, and time. He appears well-developed and well-nourished.  Cardiovascular: Normal rate, normal heart sounds and intact distal pulses.  No murmur heard. Pulmonary/Chest: Effort normal and breath sounds normal. He has no wheezes. He has no rales. He exhibits no tenderness.  Abdominal: Soft. Bowel sounds are normal. He exhibits no distension and no mass. There is no tenderness.  Musculoskeletal: Normal range of motion.  Neurological: He is alert and oriented to person, place, and time.  Skin: Skin is warm and dry.  Psychiatric: He has a normal mood and affect.     CMP Latest Ref Rng & Units 04/10/2017 01/23/2016 12/18/2015  Glucose 65 - 99 mg/dL 78 103(H) 99    BUN 8 - 27 mg/dL 9 7 17   Creatinine 0.76 - 1.27 mg/dL 1.08 1.21 1.10  Sodium 134 - 144 mmol/L 143 142 141  Potassium 3.5 - 5.2 mmol/L 4.0 4.2 3.9  Chloride 96 - 106 mmol/L 101 105 110  CO2 18 - 29 mmol/L 24 29 21(L)  Calcium 8.6 - 10.2 mg/dL 9.4 8.8(L) 8.9  Total Protein 6.0 - 8.5 g/dL 8.2 - -  Total Bilirubin 0.0 - 1.2 mg/dL 0.4 - -  Alkaline Phos 39 - 117 IU/L 94 - -  AST 0 - 40 IU/L 20 - -  ALT 0 - 44 IU/L 12 - -    Lipid Panel     Component Value Date/Time   CHOL 159 04/10/2017 1108   TRIG 116 04/10/2017 1108   HDL 41  04/10/2017 1108   CHOLHDL 3.9 04/10/2017 1108   CHOLHDL 2.5 12/09/2015 0133   VLDL 11 12/09/2015 0133   LDLCALC 95 04/10/2017 1108    Assessment & Plan:   1. Lower urinary tract symptoms Controlled - tamsulosin (FLOMAX) 0.4 MG CAPS capsule; Take 1 capsule (0.4 mg total) by mouth daily.  Dispense: 30 capsule; Refill: 5  2. Mild intermittent asthma without complication No acute exacerbation Continue Arnuity Ellipta prescribed by pulmonary Avoid triggers - albuterol (PROVENTIL HFA;VENTOLIN HFA) 108 (90 Base) MCG/ACT inhaler; Inhale 2 puffs into the lungs every 6 (six) hours as needed.  Dispense: 1 Inhaler; Refill: 5  3. Atrial fibrillation, unspecified type ([place]) Chads 2 vas score = 3 Currently on amiodarone  4. Chronic diastolic congestive heart failure (HCC) EF 14-48%, grade 1 diastolic dysfunction from 11/2016 Euvolemic He will need a 2D echo at his next visit - Digoxin level  5. Hypogonadism, male Could explain his fatigue Managed by endocrine Continue Tamoxifene for gynecomastia and Clomid for hypogonadism - VITAMIN D 25 Hydroxy (Vit-D Deficiency, Fractures) - Testosterone,Free and Total  6. Other fatigue - CBC with Differential/Platelet  7.  Hypertension Blood pressure is elevated but was 126/80 at his last visit We will repeat blood pressure again No regimen changes today Low sodium, DASH diet  Meds ordered this encounter   Medications  . tamsulosin (FLOMAX) 0.4 MG CAPS capsule    Sig: Take 1 capsule (0.4 mg total) by mouth daily.    Dispense:  30 capsule    Refill:  5  . pantoprazole (PROTONIX) 40 MG tablet    Sig: Take 1 tablet (40 mg total) by mouth daily.    Dispense:  30 tablet    Refill:  5  . metoprolol succinate (TOPROL-XL) 25 MG 24 hr tablet    Sig: Take 1 tablet (25 mg total) by mouth daily.    Dispense:  30 tablet    Refill:  5  . furosemide (LASIX) 20 MG tablet    Sig: TAKE 1 TABLET (20 MG TOTAL) BY MOUTH DAILY.    Dispense:  30 tablet    Refill:  5  . albuterol (PROVENTIL HFA;VENTOLIN HFA) 108 (90 Base) MCG/ACT inhaler    Sig: Inhale 2 puffs into the lungs every 6 (six) hours as needed.    Dispense:  1 Inhaler    Refill:  5  . atorvastatin (LIPITOR) 40 MG tablet    Sig: Take 1 tablet (40 mg total) by mouth daily at 6 PM.    Dispense:  30 tablet    Refill:  5    Follow-up: Return in about 3 months (around 02/19/2018) for Follow-up of chronic medical conditions.   Arnoldo Morale MD

## 2017-11-20 ENCOUNTER — Telehealth: Payer: Self-pay

## 2017-11-20 ENCOUNTER — Other Ambulatory Visit: Payer: Self-pay | Admitting: Family Medicine

## 2017-11-20 LAB — CBC WITH DIFFERENTIAL/PLATELET
BASOS ABS: 0.1 10*3/uL (ref 0.0–0.2)
Basos: 1 %
EOS (ABSOLUTE): 0.5 10*3/uL — ABNORMAL HIGH (ref 0.0–0.4)
Eos: 5 %
HEMOGLOBIN: 11.7 g/dL — AB (ref 13.0–17.7)
Hematocrit: 38.1 % (ref 37.5–51.0)
Immature Grans (Abs): 0 10*3/uL (ref 0.0–0.1)
Immature Granulocytes: 0 %
LYMPHS ABS: 3.1 10*3/uL (ref 0.7–3.1)
LYMPHS: 35 %
MCH: 21.5 pg — ABNORMAL LOW (ref 26.6–33.0)
MCHC: 30.7 g/dL — AB (ref 31.5–35.7)
MCV: 70 fL — ABNORMAL LOW (ref 79–97)
MONOCYTES: 12 %
Monocytes Absolute: 1.1 10*3/uL — ABNORMAL HIGH (ref 0.1–0.9)
Neutrophils Absolute: 4.3 10*3/uL (ref 1.4–7.0)
Neutrophils: 47 %
Platelets: 378 10*3/uL (ref 150–379)
RBC: 5.44 x10E6/uL (ref 4.14–5.80)
RDW: 17.2 % — ABNORMAL HIGH (ref 12.3–15.4)
WBC: 9 10*3/uL (ref 3.4–10.8)

## 2017-11-20 LAB — VITAMIN D 25 HYDROXY (VIT D DEFICIENCY, FRACTURES): VIT D 25 HYDROXY: 26 ng/mL — AB (ref 30.0–100.0)

## 2017-11-20 LAB — TESTOSTERONE,FREE AND TOTAL
TESTOSTERONE FREE: 7.2 pg/mL (ref 6.6–18.1)
Testosterone: 175 ng/dL — ABNORMAL LOW (ref 264–916)

## 2017-11-20 LAB — DIGOXIN LEVEL: Digoxin, Serum: <0.4 ng/mL — ABNORMAL LOW (ref 0.5–0.9)

## 2017-11-20 MED ORDER — ERGOCALCIFEROL 1.25 MG (50000 UT) PO CAPS: 50000.0000 [IU] | ORAL_CAPSULE | ORAL | 0 refills | Status: DC

## 2017-11-20 NOTE — Telephone Encounter (Signed)
Pt was called and informed of lab results and medication being sent to pharmacy.

## 2017-11-22 ENCOUNTER — Ambulatory Visit (INDEPENDENT_AMBULATORY_CARE_PROVIDER_SITE_OTHER): Payer: Medicare HMO | Admitting: Endocrinology

## 2017-11-22 ENCOUNTER — Encounter: Payer: Self-pay | Admitting: Endocrinology

## 2017-11-22 VITALS — BP 151/83 | HR 51 | Wt 228.0 lb

## 2017-11-22 DIAGNOSIS — F528 Other sexual dysfunction not due to a substance or known physiological condition: Secondary | ICD-10-CM | POA: Diagnosis not present

## 2017-11-22 NOTE — Progress Notes (Signed)
Subjective:    Patient ID: [name] [name], male    DOB: [birth year], 66 y.o.   MRN: [id number]  HPI The state of at least three ongoing medical problems is addressed today, with interval history of each noted here: Pt returns for f/u of idiopathic central hypogonadism (he has 1 biological child; he has never taken illicit androgens; he denies any h/o infertility; he was seen at [facility], where he was dx'ed with hypogonadotropic hypogonadism; however, pt says he wishes to f/u here in [place] instead; he was rx'ed with clomid starting in early 2015).  He takes clomid as rx'ed.  This is a stable problem.  ED sxs persist: he gets inadeq relief from viagra.  This is a stable problem. Gynecomastia: tamoxifen was rx'ed in early 2017; he declines surgery.  Since then, he says breast swelling is only slightly better.  This is a stable problem. Past Medical History:  Diagnosis Date  . Anemia   . Asthma   . Atrial fibrillation ([place])   . CHF (congestive heart failure) ([place])   . COPD (chronic obstructive pulmonary disease) ([place])   . Heart disease   . Hypertension     Past Surgical History:  Procedure Laterality Date  . CARDIAC CATHETERIZATION N/A 12/08/2015   Procedure: Left Heart Cath and Coronary Angiography;  Surgeon: Charolette Forward, MD;  Location: [facility];  Service: Cardiovascular;  Laterality: N/A;  . ESOPHAGUS SURGERY      Social History   Socioeconomic History  . Marital status: Single    Spouse name: Not on file  . Number of children: 1  . Years of education: college  . Highest education level: Not on file  Social Needs  . Financial resource strain: Not on file  . Food insecurity - worry: Not on file  . Food insecurity - inability: Not on file  . Transportation needs - medical: Not on file  . Transportation needs - non-medical: Not on file  Occupational History  . 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 Smoking status: Never Smoker  .  Smokeless tobacco: Never Used  Substance and Sexual Activity  . Alcohol use: No    Alcohol/week: 0.0 oz  . Drug use: No  . Sexual activity: Not Currently  Other Topics Concern  . Not on file  Social History Narrative   Patient lives at home alone and he is single.   Patient is semi retired.    Education college.   Right handed.   Caffeine two cokes daily.    Current Outpatient Medications on File Prior to Visit  Medication Sig Dispense Refill  . albuterol (PROVENTIL HFA;VENTOLIN HFA) 108 (90 Base) MCG/ACT inhaler Inhale 2 puffs into the lungs every 6 (six) hours as needed. 1 Inhaler 5  . albuterol (PROVENTIL) (2.5 MG/3ML) 0.083% nebulizer solution Take 3 mLs (2.5 mg total) by nebulization every 6 (six) hours as needed for wheezing or shortness of breath. 150 mL 1  . amiodarone (PACERONE) 200 MG tablet Take 1 tablet (200 mg total) by mouth 2 (two) times daily. 60 tablet 2  . aspirin EC 81 MG tablet Take 81 mg by mouth daily.      Marland Kitchen atorvastatin (LIPITOR) 40 MG tablet Take 1 tablet (40 mg total) by mouth daily at 6 PM. 30 tablet 5  . clomiPHENE (CLOMID) 50 MG tablet 1/4 tab daily 10 tablet 5  . clotrimazole-betamethasone (LOTRISONE) cream Apply 1 application topically 2 (two) times  daily. (Patient taking differently: Apply 1 application topically 2 (two) times daily as needed (FOR RASH). ) 30 g 1  . colchicine 0.6 MG tablet Take 1 tablet (0.6 mg total) by mouth 2 (two) times daily. 60 tablet 3  . digoxin (LANOXIN) 0.125 MG tablet Take 1 tablet (0.125 mg total) by mouth daily. 30 tablet 3  . ergocalciferol (DRISDOL) 50000 units capsule Take 1 capsule (50,000 Units total) by mouth once a week. 9 capsule 0  . ferrous sulfate 325 (65 FE) MG tablet Take 325 mg by mouth daily with breakfast. Reported on 03/16/2016    . Fluticasone Furoate (ARNUITY ELLIPTA) 100 MCG/ACT AEPB Inhale 1 puff into the lungs daily. 30 each 3  . furosemide (LASIX) 20 MG tablet TAKE 1 TABLET (20 MG TOTAL) BY MOUTH DAILY.  30 tablet 5  . metoprolol succinate (TOPROL-XL) 25 MG 24 hr tablet Take 1 tablet (25 mg total) by mouth daily. 30 tablet 5  . nitroGLYCERIN (NITROSTAT) 0.4 MG SL tablet Place 1 tablet (0.4 mg total) under the tongue every 5 (five) minutes as needed for chest pain. 25 tablet 1  . pantoprazole (PROTONIX) 40 MG tablet Take 1 tablet (40 mg total) by mouth daily. 30 tablet 5  . sildenafil (REVATIO) 20 MG tablet 2-5 pills as needed for ED symptoms 50 tablet 10  . tamoxifen (NOLVADEX) 10 MG tablet Take 1 tablet (10 mg total) by mouth daily. 30 tablet 11  . tamsulosin (FLOMAX) 0.4 MG CAPS capsule Take 1 capsule (0.4 mg total) by mouth daily. 30 capsule 5   No current facility-administered medications on file prior to visit.     No Known Allergies  Family History  Problem Relation Age of Onset  . Cancer Mother     BP (!) 151/83 (BP Location: Left Arm, Patient Position: Sitting, Cuff Size: Normal)   Pulse (!) 51   Wt 228 lb (103.4 kg)   SpO2 94%   BMI 35.71 kg/m    Review of Systems He has lost 9 lbs since last ov.  Denies decreased urinary stream.      Objective:   Physical Exam VITAL SIGNS:  See vs page.  GENERAL: no distress. Chest wall: severe bilat pseudogynecomastia is again noted. No palpable ductal tissue.   Ext: 1+ bilat leg edema.   Lab Results  Component Value Date   TESTOSTERONE 175 (L) 11/19/2017   Lab Results  Component Value Date   PSA 0.70 05/22/2017   PSA 0.83 02/22/2016   PSA 0.45 03/03/2015      Assessment & Plan:  Pseudogynecomastia: persistent: I offered ref surgery, but he declines Hypogonadism: well-controlled Gynecomastia: well-controlled ED: persistent  Patient Instructions  Please continue the same medications. Please see a urology specialist.  you will receive a phone call, about a day and time for an appointment normalization of testosterone is not known to harm you.  however, there are "theoretical" risks, including increased fertility, hair  loss, prostate cancer, benign prostate enlargement, blood clots, liver problems, lower hdl ("good cholesterol"), polycythemia (opposite of anemia), sleep apnea, and behavior changes.   Weight loss helps theses conditions also.  Please come back for a follow-up appointment in 1 year.

## 2017-11-22 NOTE — Patient Instructions (Addendum)
Please continue the same medications. Please see a urology specialist.  you will receive a phone call, about a day and time for an appointment normalization of testosterone is not known to harm you.  however, there are "theoretical" risks, including increased fertility, hair loss, prostate cancer, benign prostate enlargement, blood clots, liver problems, lower hdl ("good cholesterol"), polycythemia (opposite of anemia), sleep apnea, and behavior changes.   Weight loss helps theses conditions also.  Please come back for a follow-up appointment in 1 year.

## 2017-11-30 ENCOUNTER — Encounter (HOSPITAL_COMMUNITY): Payer: Self-pay | Admitting: Emergency Medicine

## 2017-11-30 ENCOUNTER — Inpatient Hospital Stay (HOSPITAL_COMMUNITY)
Admission: EM | Admit: 2017-11-30 | Discharge: 2017-12-03 | DRG: 563 | Disposition: A | Discharge status: Home / Self Care | Payer: Medicare HMO | Attending: Internal Medicine | Admitting: Internal Medicine

## 2017-11-30 DIAGNOSIS — S43015A Anterior dislocation of left humerus, initial encounter: Secondary | ICD-10-CM

## 2017-11-30 DIAGNOSIS — D508 Other iron deficiency anemias: Secondary | ICD-10-CM | POA: Diagnosis present

## 2017-11-30 DIAGNOSIS — N390 Urinary tract infection, site not specified: Secondary | ICD-10-CM | POA: Diagnosis present

## 2017-11-30 DIAGNOSIS — R404 Transient alteration of awareness: Secondary | ICD-10-CM | POA: Diagnosis not present

## 2017-11-30 DIAGNOSIS — N62 Hypertrophy of breast: Secondary | ICD-10-CM | POA: Diagnosis present

## 2017-11-30 DIAGNOSIS — Z8709 Personal history of other diseases of the respiratory system: Secondary | ICD-10-CM

## 2017-11-30 DIAGNOSIS — I110 Hypertensive heart disease with heart failure: Secondary | ICD-10-CM | POA: Diagnosis present

## 2017-11-30 DIAGNOSIS — I495 Sick sinus syndrome: Secondary | ICD-10-CM | POA: Diagnosis present

## 2017-11-30 DIAGNOSIS — I4891 Unspecified atrial fibrillation: Secondary | ICD-10-CM | POA: Diagnosis present

## 2017-11-30 DIAGNOSIS — Z7982 Long term (current) use of aspirin: Secondary | ICD-10-CM

## 2017-11-30 DIAGNOSIS — S43006A Unspecified dislocation of unspecified shoulder joint, initial encounter: Secondary | ICD-10-CM

## 2017-11-30 DIAGNOSIS — S43005D Unspecified dislocation of left shoulder joint, subsequent encounter: Secondary | ICD-10-CM | POA: Diagnosis not present

## 2017-11-30 DIAGNOSIS — Z419 Encounter for procedure for purposes other than remedying health state, unspecified: Secondary | ICD-10-CM

## 2017-11-30 DIAGNOSIS — N289 Disorder of kidney and ureter, unspecified: Secondary | ICD-10-CM | POA: Diagnosis present

## 2017-11-30 DIAGNOSIS — R32 Unspecified urinary incontinence: Secondary | ICD-10-CM | POA: Diagnosis present

## 2017-11-30 DIAGNOSIS — I509 Heart failure, unspecified: Secondary | ICD-10-CM

## 2017-11-30 DIAGNOSIS — R7989 Other specified abnormal findings of blood chemistry: Secondary | ICD-10-CM | POA: Diagnosis not present

## 2017-11-30 DIAGNOSIS — D509 Iron deficiency anemia, unspecified: Secondary | ICD-10-CM | POA: Diagnosis present

## 2017-11-30 DIAGNOSIS — Y92009 Unspecified place in unspecified non-institutional (private) residence as the place of occurrence of the external cause: Secondary | ICD-10-CM

## 2017-11-30 DIAGNOSIS — R569 Unspecified convulsions: Secondary | ICD-10-CM

## 2017-11-30 DIAGNOSIS — K219 Gastro-esophageal reflux disease without esophagitis: Secondary | ICD-10-CM | POA: Diagnosis present

## 2017-11-30 DIAGNOSIS — E291 Testicular hypofunction: Secondary | ICD-10-CM | POA: Diagnosis present

## 2017-11-30 DIAGNOSIS — Z79899 Other long term (current) drug therapy: Secondary | ICD-10-CM

## 2017-11-30 DIAGNOSIS — Z9181 History of falling: Secondary | ICD-10-CM | POA: Diagnosis not present

## 2017-11-30 DIAGNOSIS — S43005A Unspecified dislocation of left shoulder joint, initial encounter: Secondary | ICD-10-CM | POA: Diagnosis not present

## 2017-11-30 DIAGNOSIS — R55 Syncope and collapse: Secondary | ICD-10-CM | POA: Diagnosis present

## 2017-11-30 DIAGNOSIS — R42 Dizziness and giddiness: Secondary | ICD-10-CM | POA: Diagnosis not present

## 2017-11-30 DIAGNOSIS — I482 Chronic atrial fibrillation: Secondary | ICD-10-CM | POA: Diagnosis present

## 2017-11-30 DIAGNOSIS — W1839XA Other fall on same level, initial encounter: Secondary | ICD-10-CM | POA: Diagnosis present

## 2017-11-30 DIAGNOSIS — I5032 Chronic diastolic (congestive) heart failure: Secondary | ICD-10-CM | POA: Diagnosis present

## 2017-11-30 DIAGNOSIS — G4089 Other seizures: Secondary | ICD-10-CM | POA: Diagnosis present

## 2017-11-30 DIAGNOSIS — I480 Paroxysmal atrial fibrillation: Secondary | ICD-10-CM | POA: Diagnosis present

## 2017-11-30 DIAGNOSIS — J449 Chronic obstructive pulmonary disease, unspecified: Secondary | ICD-10-CM | POA: Diagnosis present

## 2017-11-30 DIAGNOSIS — Z79810 Long term (current) use of selective estrogen receptor modulators (SERMs): Secondary | ICD-10-CM | POA: Diagnosis not present

## 2017-11-30 DIAGNOSIS — I2510 Atherosclerotic heart disease of native coronary artery without angina pectoris: Secondary | ICD-10-CM | POA: Diagnosis present

## 2017-11-30 DIAGNOSIS — R21 Rash and other nonspecific skin eruption: Secondary | ICD-10-CM

## 2017-11-30 DIAGNOSIS — I119 Hypertensive heart disease without heart failure: Secondary | ICD-10-CM

## 2017-11-30 DIAGNOSIS — I10 Essential (primary) hypertension: Secondary | ICD-10-CM | POA: Diagnosis present

## 2017-11-30 DIAGNOSIS — N400 Enlarged prostate without lower urinary tract symptoms: Secondary | ICD-10-CM | POA: Diagnosis present

## 2017-11-30 DIAGNOSIS — Z8719 Personal history of other diseases of the digestive system: Secondary | ICD-10-CM

## 2017-11-30 NOTE — ED Triage Notes (Addendum)
Pt fell at home. States he fell at home after losing consciousness.  Pt states he was down for 15 min before calling EMS.  Pt complains of dizziness and left shoulder pain, which has an old injury to it but he thinks he reinjured it tonight.  100 mcg Fentanyl en route with no relief.  Hx of Afib. Pt has no recollection if he hit his head and does not know if he is on blood thinners.  Poor historian in general. Pt has strong odor of urine on assessment. Pt has a history of afib

## 2017-12-01 ENCOUNTER — Emergency Department (HOSPITAL_COMMUNITY): Payer: Medicare HMO

## 2017-12-01 ENCOUNTER — Observation Stay (HOSPITAL_COMMUNITY): Payer: Medicare HMO

## 2017-12-01 ENCOUNTER — Observation Stay (HOSPITAL_COMMUNITY): Payer: Medicare HMO | Admitting: Anesthesiology

## 2017-12-01 ENCOUNTER — Other Ambulatory Visit (HOSPITAL_COMMUNITY): Payer: Medicare HMO

## 2017-12-01 ENCOUNTER — Encounter (HOSPITAL_COMMUNITY): Payer: Self-pay | Admitting: Certified Registered Nurse Anesthetist

## 2017-12-01 ENCOUNTER — Encounter (HOSPITAL_COMMUNITY)
Admission: EM | Disposition: A | Discharge status: Home / Self Care | Payer: Self-pay | Source: Home / Self Care | Attending: Internal Medicine

## 2017-12-01 DIAGNOSIS — N390 Urinary tract infection, site not specified: Secondary | ICD-10-CM | POA: Diagnosis present

## 2017-12-01 DIAGNOSIS — S43005D Unspecified dislocation of left shoulder joint, subsequent encounter: Secondary | ICD-10-CM | POA: Diagnosis not present

## 2017-12-01 DIAGNOSIS — S43015A Anterior dislocation of left humerus, initial encounter: Secondary | ICD-10-CM | POA: Diagnosis not present

## 2017-12-01 DIAGNOSIS — R55 Syncope and collapse: Secondary | ICD-10-CM | POA: Diagnosis present

## 2017-12-01 DIAGNOSIS — S43006A Unspecified dislocation of unspecified shoulder joint, initial encounter: Secondary | ICD-10-CM | POA: Diagnosis present

## 2017-12-01 HISTORY — PX: SHOULDER CLOSED REDUCTION: SHX1051

## 2017-12-01 LAB — URINALYSIS, ROUTINE W REFLEX MICROSCOPIC
BILIRUBIN URINE: NEGATIVE
Glucose, UA: NEGATIVE mg/dL
Hgb urine dipstick: NEGATIVE
KETONES UR: NEGATIVE mg/dL
Nitrite: POSITIVE — AB
PROTEIN: NEGATIVE mg/dL
Specific Gravity, Urine: 1.021 (ref 1.005–1.030)
pH: 6 (ref 5.0–8.0)

## 2017-12-01 LAB — DIGOXIN LEVEL

## 2017-12-01 LAB — CBC WITH DIFFERENTIAL/PLATELET
BASOS ABS: 0 10*3/uL (ref 0.0–0.1)
BASOS PCT: 0 %
BASOS PCT: 0 %
Basophils Absolute: 0 10*3/uL (ref 0.0–0.1)
EOS ABS: 0.3 10*3/uL (ref 0.0–0.7)
Eosinophils Absolute: 0 10*3/uL (ref 0.0–0.7)
Eosinophils Relative: 0 %
Eosinophils Relative: 3 %
HCT: 36 % — ABNORMAL LOW (ref 39.0–52.0)
HEMATOCRIT: 37 % — AB (ref 39.0–52.0)
HEMOGLOBIN: 11.3 g/dL — AB (ref 13.0–17.0)
Hemoglobin: 11.7 g/dL — ABNORMAL LOW (ref 13.0–17.0)
LYMPHS ABS: 2.1 10*3/uL (ref 0.7–4.0)
LYMPHS PCT: 22 %
Lymphocytes Relative: 10 %
Lymphs Abs: 1.3 10*3/uL (ref 0.7–4.0)
MCH: 22.3 pg — ABNORMAL LOW (ref 26.0–34.0)
MCH: 21.9 pg — AB (ref 26.0–34.0)
MCHC: 31.6 g/dL (ref 30.0–36.0)
MCHC: 31.4 g/dL (ref 30.0–36.0)
MCV: 70.5 fL — ABNORMAL LOW (ref 78.0–100.0)
MCV: 69.8 fL — ABNORMAL LOW (ref 78.0–100.0)
MONO ABS: 0.8 10*3/uL (ref 0.1–1.0)
Monocytes Absolute: 1.2 10*3/uL — ABNORMAL HIGH (ref 0.1–1.0)
Monocytes Relative: 6 %
Monocytes Relative: 12 %
NEUTROS ABS: 11.1 10*3/uL — AB (ref 1.7–7.7)
NEUTROS ABS: 6.1 10*3/uL (ref 1.7–7.7)
Neutrophils Relative %: 84 %
Neutrophils Relative %: 63 %
Platelets: 280 10*3/uL (ref 150–400)
Platelets: 312 10*3/uL (ref 150–400)
RBC: 5.25 MIL/uL (ref 4.22–5.81)
RBC: 5.16 MIL/uL (ref 4.22–5.81)
RDW: 16.1 % — AB (ref 11.5–15.5)
RDW: 16.1 % — ABNORMAL HIGH (ref 11.5–15.5)
WBC: 13.3 10*3/uL — AB (ref 4.0–10.5)
WBC: 9.7 10*3/uL (ref 4.0–10.5)

## 2017-12-01 LAB — COMPREHENSIVE METABOLIC PANEL
ALBUMIN: 3.2 g/dL — AB (ref 3.5–5.0)
ALT: 21 U/L (ref 17–63)
AST: 28 U/L (ref 15–41)
Alkaline Phosphatase: 89 U/L (ref 38–126)
Anion gap: 9 (ref 5–15)
BILIRUBIN TOTAL: 0.2 mg/dL — AB (ref 0.3–1.2)
BUN: 17 mg/dL (ref 6–20)
CHLORIDE: 105 mmol/L (ref 101–111)
CO2: 26 mmol/L (ref 22–32)
Calcium: 8.7 mg/dL — ABNORMAL LOW (ref 8.9–10.3)
Creatinine, Ser: 1.28 mg/dL — ABNORMAL HIGH (ref 0.61–1.24)
GFR calc Af Amer: >60 mL/min (ref >60)
GFR calc non Af Amer: 56 mL/min — ABNORMAL LOW (ref >60)
GLUCOSE: 129 mg/dL — AB (ref 65–99)
POTASSIUM: 4.3 mmol/L (ref 3.5–5.1)
Sodium: 140 mmol/L (ref 135–145)
TOTAL PROTEIN: 7.7 g/dL (ref 6.5–8.1)

## 2017-12-01 LAB — TROPONIN I: Troponin I: <0.03 ng/mL (ref <0.03)

## 2017-12-01 LAB — D-DIMER, QUANTITATIVE (NOT AT ARMC): D DIMER QUANT: 1 ug{FEU}/mL — AB (ref 0.00–0.50)

## 2017-12-01 LAB — TSH: TSH: 0.531 u[IU]/mL (ref 0.350–4.500)

## 2017-12-01 SURGERY — CLOSED REDUCTION, SHOULDER
Anesthesia: General | Site: Shoulder | Laterality: Left

## 2017-12-01 MED ORDER — TRAZODONE HCL 50 MG PO TABS: 25.0000 mg | ORAL_TABLET | Freq: Every evening | ORAL | Status: DC | PRN

## 2017-12-01 MED ORDER — BUDESONIDE 0.25 MG/2ML IN SUSP
0.2500 mg | Freq: Two times a day (BID) | RESPIRATORY_TRACT | Status: DC
  Administered 2017-12-01 – 2017-12-03 (×5): 0.25 mg via RESPIRATORY_TRACT
  Filled 2017-12-01 (×6): qty 2

## 2017-12-01 MED ORDER — ONDANSETRON HCL 4 MG/2ML IJ SOLN: 4.0000 mg | Freq: Four times a day (QID) | INTRAMUSCULAR | Status: DC | PRN

## 2017-12-01 MED ORDER — MUPIROCIN 2 % EX OINT
1.0000 "application " | TOPICAL_OINTMENT | Freq: Two times a day (BID) | CUTANEOUS | Status: DC
  Administered 2017-12-01 – 2017-12-03 (×4): 1 via NASAL
  Filled 2017-12-01 (×2): qty 22

## 2017-12-01 MED ORDER — POVIDONE-IODINE 10 % EX SWAB: 2.0000 "application " | Freq: Once | CUTANEOUS | Status: DC

## 2017-12-01 MED ORDER — CHLORHEXIDINE GLUCONATE 4 % EX LIQD
60.0000 mL | Freq: Once | CUTANEOUS | Status: DC
  Filled 2017-12-01: qty 60

## 2017-12-01 MED ORDER — DEXAMETHASONE SODIUM PHOSPHATE 10 MG/ML IJ SOLN
INTRAMUSCULAR | Status: AC
  Filled 2017-12-01: qty 1

## 2017-12-01 MED ORDER — PHENYLEPHRINE 40 MCG/ML (10ML) SYRINGE FOR IV PUSH (FOR BLOOD PRESSURE SUPPORT)
PREFILLED_SYRINGE | INTRAVENOUS | Status: AC
  Filled 2017-12-01: qty 10

## 2017-12-01 MED ORDER — SENNOSIDES-DOCUSATE SODIUM 8.6-50 MG PO TABS: 1.0000 | ORAL_TABLET | Freq: Every evening | ORAL | Status: DC | PRN

## 2017-12-01 MED ORDER — DEXTROSE 5 % IV SOLN
1.0000 g | Freq: Once | INTRAVENOUS | Status: AC
  Administered 2017-12-01: 1 g via INTRAVENOUS
  Filled 2017-12-01: qty 10

## 2017-12-01 MED ORDER — ONDANSETRON HCL 4 MG/2ML IJ SOLN
INTRAMUSCULAR | Status: AC
  Filled 2017-12-01: qty 2

## 2017-12-01 MED ORDER — EPHEDRINE 5 MG/ML INJ
INTRAVENOUS | Status: AC
  Filled 2017-12-01: qty 10

## 2017-12-01 MED ORDER — PANTOPRAZOLE SODIUM 40 MG PO TBEC
40.0000 mg | DELAYED_RELEASE_TABLET | Freq: Every day | ORAL | Status: DC
  Administered 2017-12-02 – 2017-12-03 (×2): 40 mg via ORAL
  Filled 2017-12-01 (×2): qty 1

## 2017-12-01 MED ORDER — ONDANSETRON HCL 4 MG PO TABS
4.0000 mg | ORAL_TABLET | Freq: Four times a day (QID) | ORAL | Status: DC | PRN
  Administered 2017-12-02: 4 mg via ORAL
  Filled 2017-12-01: qty 1

## 2017-12-01 MED ORDER — ENOXAPARIN SODIUM 40 MG/0.4ML ~~LOC~~ SOLN
40.0000 mg | Freq: Every day | SUBCUTANEOUS | Status: DC
  Filled 2017-12-01: qty 0.4

## 2017-12-01 MED ORDER — HYDROMORPHONE HCL 1 MG/ML IJ SOLN
0.5000 mg | Freq: Once | INTRAMUSCULAR | Status: AC
  Administered 2017-12-01: 0.5 mg via INTRAVENOUS
  Filled 2017-12-01: qty 1

## 2017-12-01 MED ORDER — ACETAMINOPHEN 325 MG PO TABS: 650.0000 mg | ORAL_TABLET | Freq: Four times a day (QID) | ORAL | Status: DC | PRN

## 2017-12-01 MED ORDER — FENTANYL CITRATE (PF) 100 MCG/2ML IJ SOLN
25.0000 ug | INTRAMUSCULAR | Status: DC | PRN
Start: 2017-12-01 — End: 2017-12-01

## 2017-12-01 MED ORDER — HYDROCODONE-ACETAMINOPHEN 5-325 MG PO TABS: 1.0000 | ORAL_TABLET | ORAL | Status: DC | PRN

## 2017-12-01 MED ORDER — AMIODARONE HCL 100 MG PO TABS
200.0000 mg | ORAL_TABLET | Freq: Two times a day (BID) | ORAL | Status: DC
  Administered 2017-12-01 – 2017-12-03 (×4): 200 mg via ORAL
  Filled 2017-12-01 (×4): qty 2

## 2017-12-01 MED ORDER — PROPOFOL 10 MG/ML IV BOLUS
INTRAVENOUS | Status: AC
  Filled 2017-12-01: qty 20

## 2017-12-01 MED ORDER — ALBUTEROL SULFATE (2.5 MG/3ML) 0.083% IN NEBU
2.5000 mg | INHALATION_SOLUTION | Freq: Four times a day (QID) | RESPIRATORY_TRACT | Status: DC | PRN
  Administered 2017-12-02: 2.5 mg via RESPIRATORY_TRACT
  Filled 2017-12-01: qty 3

## 2017-12-01 MED ORDER — SODIUM CHLORIDE 0.9% FLUSH
3.0000 mL | Freq: Two times a day (BID) | INTRAVENOUS | Status: DC
  Administered 2017-12-01 – 2017-12-03 (×3): 3 mL via INTRAVENOUS

## 2017-12-01 MED ORDER — FERROUS SULFATE 325 (65 FE) MG PO TABS
325.0000 mg | ORAL_TABLET | Freq: Every day | ORAL | Status: DC
  Administered 2017-12-02 – 2017-12-03 (×2): 325 mg via ORAL
  Filled 2017-12-01 (×3): qty 1

## 2017-12-01 MED ORDER — SODIUM CHLORIDE 0.9 % IV SOLN
INTRAVENOUS | Status: AC
  Administered 2017-12-01: 08:00:00 via INTRAVENOUS

## 2017-12-01 MED ORDER — PROPOFOL 10 MG/ML IV BOLUS
0.5000 mg/kg | Freq: Once | INTRAVENOUS | Status: DC
  Filled 2017-12-01: qty 20

## 2017-12-01 MED ORDER — MIDAZOLAM HCL 2 MG/2ML IJ SOLN
INTRAMUSCULAR | Status: AC
  Filled 2017-12-01: qty 2

## 2017-12-01 MED ORDER — ACETAMINOPHEN 650 MG RE SUPP: 650.0000 mg | Freq: Four times a day (QID) | RECTAL | Status: DC | PRN

## 2017-12-01 MED ORDER — ROCURONIUM BROMIDE 10 MG/ML (PF) SYRINGE
PREFILLED_SYRINGE | INTRAVENOUS | Status: AC
  Filled 2017-12-01: qty 5

## 2017-12-01 MED ORDER — TAMOXIFEN CITRATE 10 MG PO TABS
10.0000 mg | ORAL_TABLET | Freq: Every day | ORAL | Status: DC
  Filled 2017-12-01: qty 1

## 2017-12-01 MED ORDER — FENTANYL CITRATE (PF) 100 MCG/2ML IJ SOLN
INTRAMUSCULAR | Status: DC | PRN
  Administered 2017-12-01: 75 ug via INTRAVENOUS

## 2017-12-01 MED ORDER — CHLORHEXIDINE GLUCONATE CLOTH 2 % EX PADS
6.0000 | MEDICATED_PAD | Freq: Every day | CUTANEOUS | Status: DC
  Administered 2017-12-02: 6 via TOPICAL

## 2017-12-01 MED ORDER — SUCCINYLCHOLINE CHLORIDE 200 MG/10ML IV SOSY
PREFILLED_SYRINGE | INTRAVENOUS | Status: AC
  Filled 2017-12-01: qty 10

## 2017-12-01 MED ORDER — SUGAMMADEX SODIUM 200 MG/2ML IV SOLN
INTRAVENOUS | Status: AC
  Filled 2017-12-01: qty 2

## 2017-12-01 MED ORDER — LIDOCAINE HCL (CARDIAC) 20 MG/ML IV SOLN
INTRAVENOUS | Status: DC | PRN
  Administered 2017-12-01: 60 mg via INTRAVENOUS

## 2017-12-01 MED ORDER — ASPIRIN EC 81 MG PO TBEC
81.0000 mg | DELAYED_RELEASE_TABLET | Freq: Every day | ORAL | Status: DC
  Administered 2017-12-02 – 2017-12-03 (×2): 81 mg via ORAL
  Filled 2017-12-01 (×2): qty 1

## 2017-12-01 MED ORDER — ALBUTEROL SULFATE (2.5 MG/3ML) 0.083% IN NEBU: 2.5000 mg | INHALATION_SOLUTION | Freq: Four times a day (QID) | RESPIRATORY_TRACT | Status: DC | PRN

## 2017-12-01 MED ORDER — PROPOFOL 10 MG/ML IV BOLUS
INTRAVENOUS | Status: DC | PRN
  Administered 2017-12-01: 150 mg via INTRAVENOUS

## 2017-12-01 MED ORDER — LIDOCAINE 2% (20 MG/ML) 5 ML SYRINGE
INTRAMUSCULAR | Status: AC
  Filled 2017-12-01: qty 5

## 2017-12-01 MED ORDER — PROPOFOL 10 MG/ML IV BOLUS
INTRAVENOUS | Status: AC | PRN
  Administered 2017-12-01: 50 mg via INTRAVENOUS
  Administered 2017-12-01: 25 mg via INTRAVENOUS

## 2017-12-01 MED ORDER — LACTATED RINGERS IV SOLN
INTRAVENOUS | Status: DC
  Administered 2017-12-01: 12:00:00 via INTRAVENOUS

## 2017-12-01 MED ORDER — METOPROLOL SUCCINATE ER 25 MG PO TB24
25.0000 mg | ORAL_TABLET | Freq: Every day | ORAL | Status: DC
  Administered 2017-12-01 – 2017-12-03 (×3): 25 mg via ORAL
  Filled 2017-12-01 (×4): qty 1

## 2017-12-01 MED ORDER — DEXTROSE 5 % IV SOLN
500.0000 mg | Freq: Once | INTRAVENOUS | Status: DC
  Filled 2017-12-01: qty 5

## 2017-12-01 MED ORDER — DIGOXIN 125 MCG PO TABS
0.1250 mg | ORAL_TABLET | Freq: Every day | ORAL | Status: DC
  Administered 2017-12-02 – 2017-12-03 (×2): 0.125 mg via ORAL
  Filled 2017-12-01 (×2): qty 1

## 2017-12-01 MED ORDER — COLCHICINE 0.6 MG PO TABS
0.6000 mg | ORAL_TABLET | Freq: Two times a day (BID) | ORAL | Status: DC
  Administered 2017-12-01 – 2017-12-03 (×4): 0.6 mg via ORAL
  Filled 2017-12-01 (×4): qty 1

## 2017-12-01 MED ORDER — FENTANYL CITRATE (PF) 250 MCG/5ML IJ SOLN
INTRAMUSCULAR | Status: AC
  Filled 2017-12-01: qty 5

## 2017-12-01 MED ORDER — DEXTROSE 5 % IV SOLN
1.0000 g | INTRAVENOUS | Status: DC
  Administered 2017-12-02 – 2017-12-03 (×2): 1 g via INTRAVENOUS
  Filled 2017-12-01 (×3): qty 10

## 2017-12-01 MED ORDER — TAMSULOSIN HCL 0.4 MG PO CAPS
0.4000 mg | ORAL_CAPSULE | Freq: Every day | ORAL | Status: DC
Start: 2017-12-01 — End: 2017-12-03
  Administered 2017-12-02 – 2017-12-03 (×2): 0.4 mg via ORAL
  Filled 2017-12-01 (×2): qty 1

## 2017-12-01 MED ORDER — ATORVASTATIN CALCIUM 40 MG PO TABS
40.0000 mg | ORAL_TABLET | Freq: Every day | ORAL | Status: DC
  Administered 2017-12-01 – 2017-12-02 (×2): 40 mg via ORAL
  Filled 2017-12-01 (×3): qty 1

## 2017-12-01 SURGICAL SUPPLY — 72 items
APL SKNCLS STERI-STRIP NONHPOA (GAUZE/BANDAGES/DRESSINGS)
BENZOIN TINCTURE PRP APPL 2/3 (GAUZE/BANDAGES/DRESSINGS) ×1 IMPLANT
BLADE SAG 18X100X1.27 (BLADE) ×1 IMPLANT
BRUSH FEMORAL CANAL (MISCELLANEOUS) IMPLANT
BRUSH SCRUB SURG 4.25 DISP (MISCELLANEOUS) ×2 IMPLANT
BUR SURG 4X8 MED (BURR) IMPLANT
BURR SURG 4MMX8MM MEDIUM (BURR)
BURR SURG 4X8 MED (BURR)
CLOSURE WOUND 1/2 X4 (GAUZE/BANDAGES/DRESSINGS)
COVER BACK TABLE 60X90IN (DRAPES) ×1 IMPLANT
COVER MAYO STAND STRL (DRAPES) ×1 IMPLANT
COVER SURGICAL LIGHT HANDLE (MISCELLANEOUS) ×2 IMPLANT
DRAPE C-ARM 42X72 X-RAY (DRAPES) IMPLANT
DRAPE C-ARMOR (DRAPES) ×1 IMPLANT
DRAPE IMP U-DRAPE 54X76 (DRAPES) ×1 IMPLANT
DRAPE INCISE IOBAN 66X45 STRL (DRAPES) ×1 IMPLANT
DRAPE U-SHAPE 47X51 STRL (DRAPES) ×1 IMPLANT
DRSG ADAPTIC 3X8 NADH LF (GAUZE/BANDAGES/DRESSINGS) ×1 IMPLANT
DRSG PAD ABDOMINAL 8X10 ST (GAUZE/BANDAGES/DRESSINGS) ×2 IMPLANT
ELECT BLADE 6.5 EXT (BLADE) IMPLANT
ELECT REM PT RETURN 9FT ADLT (ELECTROSURGICAL)
ELECTRODE REM PT RTRN 9FT ADLT (ELECTROSURGICAL) ×1 IMPLANT
EVACUATOR 1/8 PVC DRAIN (DRAIN) IMPLANT
FACESHIELD WRAPAROUND (MASK) IMPLANT
FACESHIELD WRAPAROUND OR TEAM (MASK) ×2 IMPLANT
GAUZE SPONGE 4X4 12PLY STRL (GAUZE/BANDAGES/DRESSINGS) ×1 IMPLANT
GLOVE BIO SURGEON STRL SZ7.5 (GLOVE) ×1 IMPLANT
GLOVE BIO SURGEON STRL SZ8 (GLOVE) ×1 IMPLANT
GLOVE BIOGEL PI IND STRL 7.5 (GLOVE) ×1 IMPLANT
GLOVE BIOGEL PI IND STRL 8 (GLOVE) ×1 IMPLANT
GLOVE BIOGEL PI INDICATOR 7.5 (GLOVE)
GLOVE BIOGEL PI INDICATOR 8 (GLOVE)
GOWN STRL REUS W/ TWL LRG LVL3 (GOWN DISPOSABLE) ×2 IMPLANT
GOWN STRL REUS W/ TWL XL LVL3 (GOWN DISPOSABLE) ×1 IMPLANT
GOWN STRL REUS W/TWL 2XL LVL3 (GOWN DISPOSABLE) ×1 IMPLANT
GOWN STRL REUS W/TWL LRG LVL3 (GOWN DISPOSABLE)
GOWN STRL REUS W/TWL XL LVL3 (GOWN DISPOSABLE)
HANDPIECE INTERPULSE COAX TIP (DISPOSABLE)
KIT BASIN OR (CUSTOM PROCEDURE TRAY) ×1 IMPLANT
KIT ROOM TURNOVER OR (KITS) ×4 IMPLANT
MANIFOLD NEPTUNE II (INSTRUMENTS) ×1 IMPLANT
NDL 1/2 CIR CATGUT .05X1.09 (NEEDLE) ×1 IMPLANT
NDL HYPO 25GX1X1/2 BEV (NEEDLE) ×1 IMPLANT
NEEDLE 1/2 CIR CATGUT .05X1.09 (NEEDLE) IMPLANT
NEEDLE HYPO 25GX1X1/2 BEV (NEEDLE) IMPLANT
NS IRRIG 1000ML POUR BTL (IV SOLUTION) ×1 IMPLANT
PACK TOTAL JOINT (CUSTOM PROCEDURE TRAY) ×1 IMPLANT
PACK UNIVERSAL I (CUSTOM PROCEDURE TRAY) ×1 IMPLANT
PAD ARMBOARD 7.5X6 YLW CONV (MISCELLANEOUS) ×2 IMPLANT
PASSER SUT SWANSON 36MM LOOP (INSTRUMENTS) IMPLANT
SET HNDPC FAN SPRY TIP SCT (DISPOSABLE) IMPLANT
SLING ARM IMMOBILIZER LRG (SOFTGOODS) ×3 IMPLANT
SPONGE LAP 18X18 X RAY DECT (DISPOSABLE) ×1 IMPLANT
STRIP CLOSURE SKIN 1/2X4 (GAUZE/BANDAGES/DRESSINGS) ×1 IMPLANT
SUCTION FRAZIER HANDLE 10FR (MISCELLANEOUS)
SUCTION TUBE FRAZIER 10FR DISP (MISCELLANEOUS) ×1 IMPLANT
SUT ETHIBOND 2 OS 4 DA (SUTURE) ×4 IMPLANT
SUT FIBERWIRE #2 38 REV NDL BL (SUTURE)
SUT FIBERWIRE #2 38 T-5 BLUE (SUTURE)
SUT VIC AB 2-0 CT3 27 (SUTURE) IMPLANT
SUT VIC AB 2-0 FS1 27 (SUTURE) ×3 IMPLANT
SUTURE FIBERWR #2 38 T-5 BLUE (SUTURE) IMPLANT
SUTURE FIBERWR#2 38 REV NDL BL (SUTURE) ×2 IMPLANT
SWABSTICK BENZOIN STERILE (MISCELLANEOUS) ×4 IMPLANT
SYR CONTROL 10ML LL (SYRINGE) ×1 IMPLANT
SYR TOOMEY 50ML (SYRINGE) IMPLANT
TOWEL OR 17X24 6PK STRL BLUE (TOWEL DISPOSABLE) ×1 IMPLANT
TOWEL OR 17X26 10 PK STRL BLUE (TOWEL DISPOSABLE) ×2 IMPLANT
TOWER CARTRIDGE SMART MIX (DISPOSABLE) IMPLANT
TRAY FOLEY W/METER SILVER 16FR (SET/KITS/TRAYS/PACK) IMPLANT
WATER STERILE IRR 1000ML POUR (IV SOLUTION) ×1 IMPLANT
YANKAUER SUCT BULB TIP NO VENT (SUCTIONS) ×1 IMPLANT

## 2017-12-01 NOTE — Progress Notes (Signed)
Patient voided 100 cc's of urine with significant amount of blood. Urine was red with some amber undertones from the urine. No sediment was present in the urine at the time of collection. Patient has also excoriated skin on bilateral inner thighs as a result of him scratching profusely.Bilateral inner thighs are bleeding as well. There is a rash on bilateral inner thighs. Patient states that the rash is undiagnosed eczema and that he applies a topical cream at the site while at home. [facility] has been notified. NP returned call and advise for a STAT CBC. Rounding will potentially readdress in the morning. Nursing will continue to monitor.

## 2017-12-01 NOTE — Transfer of Care (Signed)
Immediate Anesthesia Transfer of Care Note  Patient: MATIX HENSHAW  Procedure(s) Performed: CLOSED REDUCTION SHOULDER (Left Shoulder)  Patient Location: PACU  Anesthesia Type:MAC  Level of Consciousness: drowsy  Airway & Oxygen Therapy: Patient Spontanous Breathing and Patient connected to face mask oxygen  Post-op Assessment: Report given to RN and Post -op Vital signs reviewed and stable  Post vital signs: Reviewed and stable  Last Vitals:  Vitals:   12/01/17 1104 12/01/17 1248  BP: (!) 173/76   Pulse: 70   Resp:    Temp:  (P) 36.5 C  SpO2:      Last Pain:  Vitals:   12/01/17 0751  TempSrc: Oral  PainSc:          Complications: No apparent anesthesia complications

## 2017-12-01 NOTE — ED Provider Notes (Signed)
[facility] EMERGENCY DEPARTMENT Provider Note   CSN: [id number] Arrival date & time: 11/30/17  2332     History   Chief Complaint Chief Complaint  Patient presents with  . Loss of Consciousness  . Fall    HPI [name] [name] is a 66 y.o. male.  Patient here for evaluation of syncope while at home tonight. He had just finished eating, stood up, got "woozy" and woke on the floor. He estimates he was out for about 15 minutes. He complains of significant left shoulder pain. He denies other pain, including head, neck, abdomen, chest, hip or extremities. No pre-syncope chest pain. No SOB. He has not been sick recently.    The history is provided by the patient. No language interpreter was used.    Past Medical History:  Diagnosis Date  . Anemia   . Asthma   . Atrial fibrillation ([place])   . CHF (congestive heart failure) ([place])   . COPD (chronic obstructive pulmonary disease) ([place])   . Heart disease   . Hypertension     Patient Active Problem List   Diagnosis Date Noted  . Confusion 06/12/2017  . Elevated sed rate 12/26/2016  . History of gastroesophageal reflux (GERD) 12/22/2016  . History of asthma 12/22/2016  . History of COPD 12/22/2016  . Benign prostatic hyperplasia 03/21/2016  . Abnormal involuntary movements 03/08/2016  . COPD (chronic obstructive pulmonary disease) ([place]) 12/24/2015  . Pleural effusion on left   . HCAP (healthcare-associated pneumonia) 12/15/2015  . Left lower lobe pneumonia ([place])   . Sepsis due to pneumonia ([place]) 11/27/2015  . Acute respiratory failure ([place]) 11/27/2015  . Asthma exacerbation 11/27/2015  . Anemia 11/27/2015  . CHF (congestive heart failure) ([place]) 11/27/2015  . Sepsis ([place]) 11/27/2015  . Screening for prostate cancer 03/03/2015  . Gynecomastia 03/03/2015  . Seizures ([place]) 10/09/2014  . Essential hypertension 08/12/2014  . Chest pain 01/24/2014  . Sick sinus syndrome ([place]) 03/21/2013  . TINEA CRURIS  09/30/2010  . Hypogonadism, male 09/23/2010  . GERD 09/01/2010  . DIZZINESS 09/01/2010  . CHEST PAIN UNSPECIFIED 09/01/2010  . Sleep apnea 08/20/2010  . Other fatigue 06/28/2010  . Abnormal involuntary movement 06/28/2010  . Vitamin D deficiency 03/25/2010  . Hypogonadism male 01/20/2010  . ERECTILE DYSFUNCTION 11/26/2008  . Essential hypertension, benign 11/26/2008  . [place] ANEMIA 09/24/2008  . IRON DEFIC ANEMIA [place] DIET IRON INTAKE 09/21/2008  . Atrial fibrillation ([place]) 09/21/2008  . SICK SINUS SYNDROME 09/21/2008  . Asthma 09/21/2008  . ESOPHAGITIS 08/26/2008  . ESOPHAGEAL MOTILITY DISORDER 08/26/2008  . GASTRITIS, ACUTE 08/26/2008    Past Surgical History:  Procedure Laterality Date  . CARDIAC CATHETERIZATION N/A 12/08/2015   Procedure: Left Heart Cath and Coronary Angiography;  Surgeon: Charolette Forward, MD;  Location: [facility];  Service: Cardiovascular;  Laterality: N/A;  . ESOPHAGUS SURGERY         Home Medications    Prior to Admission medications   Medication Sig Start Date End Date Taking? Authorizing Provider  albuterol (PROVENTIL HFA;VENTOLIN HFA) 108 (90 Base) MCG/ACT inhaler Inhale 2 puffs into the lungs every 6 (six) hours as needed. 11/19/17   Arnoldo Morale, MD  albuterol (PROVENTIL) (2.5 MG/3ML) 0.083% nebulizer solution Take 3 mLs (2.5 mg total) by nebulization every 6 (six) hours as needed for wheezing or shortness of breath. 08/17/17   Arnoldo Morale, MD  amiodarone (PACERONE) 200 MG tablet Take 1 tablet (200 mg total) by mouth 2 (two) times daily.  12/18/15   Charolette Forward, MD  aspirin EC 81 MG tablet Take 81 mg by mouth daily.      [provider]  atorvastatin (LIPITOR) 40 MG tablet Take 1 tablet (40 mg total) by mouth daily at 6 PM. 11/19/17   Arnoldo Morale, MD  clomiPHENE (CLOMID) 50 MG tablet 1/4 tab daily 06/01/16   Renato Shin, MD  clotrimazole-betamethasone (LOTRISONE) cream Apply 1 application topically 2 (two) times  daily. Patient taking differently: Apply 1 application topically 2 (two) times daily as needed (FOR RASH).  03/12/15   Lorayne Marek, MD  colchicine 0.6 MG tablet Take 1 tablet (0.6 mg total) by mouth 2 (two) times daily. 12/18/15   Charolette Forward, MD  digoxin (LANOXIN) 0.125 MG tablet Take 1 tablet (0.125 mg total) by mouth daily. 12/18/15   Charolette Forward, MD  ergocalciferol (DRISDOL) 50000 units capsule Take 1 capsule (50,000 Units total) by mouth once a week. 11/20/17   Arnoldo Morale, MD  ferrous sulfate 325 (65 FE) MG tablet Take 325 mg by mouth daily with breakfast. Reported on 03/16/2016    [provider]  Fluticasone Furoate (ARNUITY ELLIPTA) 100 MCG/ACT AEPB Inhale 1 puff into the lungs daily. 11/13/17   Brand Males, MD  furosemide (LASIX) 20 MG tablet TAKE 1 TABLET (20 MG TOTAL) BY MOUTH DAILY. 11/19/17   Arnoldo Morale, MD  metoprolol succinate (TOPROL-XL) 25 MG 24 hr tablet Take 1 tablet (25 mg total) by mouth daily. 11/19/17   Arnoldo Morale, MD  nitroGLYCERIN (NITROSTAT) 0.4 MG SL tablet Place 1 tablet (0.4 mg total) under the tongue every 5 (five) minutes as needed for chest pain. 01/25/14   Dixie Dials, MD  pantoprazole (PROTONIX) 40 MG tablet Take 1 tablet (40 mg total) by mouth daily. 11/19/17   Arnoldo Morale, MD  sildenafil (REVATIO) 20 MG tablet 2-5 pills as needed for ED symptoms 11/23/16   Renato Shin, MD  tamoxifen (NOLVADEX) 10 MG tablet Take 1 tablet (10 mg total) by mouth daily. 06/01/16   Renato Shin, MD  tamsulosin (FLOMAX) 0.4 MG CAPS capsule Take 1 capsule (0.4 mg total) by mouth daily. 11/19/17   Arnoldo Morale, MD    Family History Family History  Problem Relation Age of Onset  . Cancer Mother     Social History Social History   Tobacco Use  . Smoking status: Never Smoker  . Smokeless tobacco: Never Used  Substance Use Topics  . Alcohol use: No    Alcohol/week: 0.0 oz  . Drug use: No     Allergies   Patient has no known  allergies.   Review of Systems Review of Systems  Constitutional: Negative for chills and fever.  HENT: Negative.   Respiratory: Negative.  Negative for shortness of breath.   Cardiovascular: Negative.  Negative for chest pain.  Gastrointestinal: Negative.  Negative for abdominal pain and vomiting.  Musculoskeletal: Negative for back pain and neck pain.       See HPI.  Skin: Negative.   Neurological: Positive for syncope. Negative for headaches.     Physical Exam Updated Vital Signs BP 124/65 (BP Location: Right Arm)   Pulse 76   Temp 97.9 F (36.6 C)   Resp 16   SpO2 98%   Physical Exam  Constitutional: He is oriented to person, place, and time. He appears well-developed and well-nourished.  HENT:  Head: Normocephalic.  Neck: Normal range of motion. Neck supple.  Cardiovascular: Normal rate, regular rhythm and intact distal pulses.  Pulmonary/Chest:  Effort normal and breath sounds normal. He has no wheezes. He has no rales.  Abdominal: Soft. Bowel sounds are normal. There is no tenderness. There is no rebound and no guarding.  Musculoskeletal: Normal range of motion. He exhibits deformity.  Left shoulder has pronounced step off c/w dislocation.   Neurological: He is alert and oriented to person, place, and time. Coordination normal.  Skin: Skin is warm and dry. No rash noted.  Psychiatric: He has a normal mood and affect.     ED Treatments / Results  Labs (all labs ordered are listed, but only abnormal results are displayed) Labs Reviewed - No data to display  EKG  EKG Interpretation  Date/Time:  Friday November 30 2017 23:42:16 EST Ventricular Rate:  77 PR Interval:    QRS Duration: 105 QT Interval:  392 QTC Calculation: 444 R Axis:   10 Text Interpretation:  Sinus rhythm Normal ECG When compared with ECG of 04/10/2017, HEART RATE has increased Confirmed by Delora Fuel ([id number]) on 11/30/2017 11:53:55 PM       Radiology No results  found.  Procedures Procedures (including critical care time)  Medications Ordered in ED Medications  HYDROmorphone (DILAUDID) injection 0.5 mg (not administered)     Initial Impression / Assessment and Plan / ED Course  I have reviewed the triage vital signs and the nursing notes.  Pertinent labs & imaging results that were available during my care of the patient were reviewed by me and considered in my medical decision making (see chart for details).     Patient presents for evaluation of syncopal episode just PTA. He denies chest pain, SOB, recent illness. Post-episode he has significant left shoulder pain as only presenting injury.  He has a seizure history but reports it involves only focal seizure, followed by neurology, not treated with any anticonvulsants. He lives alone and cannot state whether he had a grand mal seizure. Per EMS, there was a smell of urine on the patient but he denies incontinence. No tongue biting. Unknown whether there was a post-ictal phase.   Multiple attempts at reducing dislocation were unsuccessful. Discussed with Dr. Marcelino Scot, Ortho, who will see the patient in the ED.   Delay in blood specimen collection due to difficult stick.   Plan on admission for observation and further evaluation of syncope. EKG sinus rhythm. History of atrial fibrillation, none on exam today.   Discussed admission with Dr. Hal Hope, [facility], who accepts for admission.  Final Clinical Impressions(s) / ED Diagnoses   Final diagnoses:  None   1. Syncope 2. Left shoulder dislocation  ED Discharge Orders    None       Dennie Bible 37/85/88 [id number]    Delora Fuel, MD 74/12/87 [phone]

## 2017-12-01 NOTE — Plan of Care (Signed)
  Coping: Level of anxiety will decrease 12/01/2017 1443 - Progressing by Williams Che, RN   Pain Managment: General experience of comfort will improve 12/01/2017 1443 - Progressing by Williams Che, RN   Safety: Ability to remain free from injury will improve 12/01/2017 1443 - Progressing by Williams Che, RN

## 2017-12-01 NOTE — ED Notes (Signed)
Pt is back to pre procedure LOC.  Speaking normally with RN

## 2017-12-01 NOTE — Anesthesia Preprocedure Evaluation (Signed)
Anesthesia Evaluation  Patient identified by MRN, date of birth, ID band Patient awake    Reviewed: Allergy & Precautions, NPO status , Patient's Chart, lab work & pertinent test results  Airway Mallampati: II  TM Distance: >3 FB Neck ROM: Full    Dental   Pulmonary asthma , sleep apnea , COPD,    breath sounds clear to auscultation       Cardiovascular hypertension, Pt. on medications +CHF  + dysrhythmias  Rhythm:Regular Rate:Normal     Neuro/Psych Seizures -,     GI/Hepatic Neg liver ROS, GERD  ,  Endo/Other  hypogonadism  Renal/GU negative Renal ROS     Musculoskeletal   Abdominal   Peds  Hematology  (+) anemia ,   Anesthesia Other Findings   Reproductive/Obstetrics                             Anesthesia Physical Anesthesia Plan  ASA: III  Anesthesia Plan: General   Post-op Pain Management:    Induction: Intravenous  PONV Risk Score and Plan: 2 and Ondansetron, Propofol infusion and Treatment may vary due to age or medical condition  Airway Management Planned: Mask and Natural Airway  Additional Equipment:   Intra-op Plan:   Post-operative Plan:   Informed Consent: I have reviewed the patients History and Physical, chart, labs and discussed the procedure including the risks, benefits and alternatives for the proposed anesthesia with the patient or authorized representative who has indicated his/her understanding and acceptance.     Plan Discussed with: CRNA  Anesthesia Plan Comments:         Anesthesia Quick Evaluation

## 2017-12-01 NOTE — H&P (Signed)
History and Physical    [name] [name] BMW:[id number] DOB: July 23, 1951 DOA: 11/30/2017  PCP: Arnoldo Morale, MD Patient coming from: home  Chief Complaint: syncope/shoulder pain  HPI: [name] [name] is a very pleasant 66 y.o. male with medical history significant for paroxysmal A. fib, a flutter, diastolic congestive heart failure, CAD, sick sinus syndrome, COPD not on home oxygen, GERD, BPH, hypogonadism presents to the emergency Department chief complaint of syncope and shoulder pain. Initial evaluation reveals UTI and dislocated left shoulder. [facility] are asked to admit  Information is obtained from the patient. He states he was in his usual state of health until this morning when he got up from his chair and awakened on the floor. He state he felt pain in his left shoulder was unable to get up. He denies hitting his head he denies any other injury he denies incontinence. He called EMS who noted urine on the floor a normal sinus rhythm. His transported to the emergency department. He denies any headache dizziness chest pain palpitations shortness of breath. He denies any recent illness nausea vomiting fever chills diarrhea. He states he's been eating and drinking his normal amount. He denies lower extremity edema orthopnea cough. He denies dysuria hematuria frequency or urgency. He does complain of left shoulder pain describes it as constant located throughout the left shoulder characterizes it as throbbing rates it a rate 9 out of 10. He also notes he had a similar episode    ED Course: In the emergency department he's afebrile hemodynamically stable and not hypoxic. He is provided with analgesia. In addition attempt was made to reduce the left shoulder dislocation under procedural sedation but this was not successful. Orthopedic consult has been requested by the emergency department provider  Review of Systems: As per HPI otherwise all other systems reviewed and are negative.    Ambulatory Status: Ambulates independently his independent with ADLs  Past Medical History:  Diagnosis Date  . Anemia   . Asthma   . Atrial fibrillation ([place])   . CHF (congestive heart failure) ([place])   . COPD (chronic obstructive pulmonary disease) ([place])   . Heart disease   . Hypertension     Past Surgical History:  Procedure Laterality Date  . CARDIAC CATHETERIZATION N/A 12/08/2015   Procedure: Left Heart Cath and Coronary Angiography;  Surgeon: Charolette Forward, MD;  Location: [facility] [facility];  Service: Cardiovascular;  Laterality: N/A;  . ESOPHAGUS SURGERY      Social History   Socioeconomic History  . Marital status: Single    Spouse name: Not on file  . Number of children: 1  . Years of education: college  . Highest education level: Not on file  Social Needs  . Financial resource strain: Not on file  . Food insecurity - worry: Not on file  . Food insecurity - inability: Not on file  . Transportation needs - medical: Not on file  . Transportation needs - non-medical: Not on file  Occupational History  . 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 Smoking status: Never Smoker  . Smokeless tobacco: Never Used  Substance and Sexual Activity  . Alcohol use: No    Alcohol/week: 0.0 oz  . Drug use: No  . Sexual activity: Not Currently  Other Topics Concern  . Not on file  Social History Narrative   Patient lives at home alone and he is single.   Patient is semi retired.  Education college.   Right handed.   Caffeine two cokes daily.    No Known Allergies  Family History  Problem Relation Age of Onset  . Cancer Mother     Prior to Admission medications   Medication Sig Start Date End Date Taking? Authorizing Provider  albuterol (PROVENTIL HFA;VENTOLIN HFA) 108 (90 Base) MCG/ACT inhaler Inhale 2 puffs into the lungs every 6 (six) hours as needed. 11/19/17   Arnoldo Morale, MD  albuterol (PROVENTIL) (2.5 MG/3ML) 0.083% nebulizer  solution Take 3 mLs (2.5 mg total) by nebulization every 6 (six) hours as needed for wheezing or shortness of breath. 08/17/17   Arnoldo Morale, MD  amiodarone (PACERONE) 200 MG tablet Take 1 tablet (200 mg total) by mouth 2 (two) times daily. 12/18/15   Charolette Forward, MD  aspirin EC 81 MG tablet Take 81 mg by mouth daily.      [provider]  atorvastatin (LIPITOR) 40 MG tablet Take 1 tablet (40 mg total) by mouth daily at 6 PM. 11/19/17   Arnoldo Morale, MD  clomiPHENE (CLOMID) 50 MG tablet 1/4 tab daily 06/01/16   Renato Shin, MD  clotrimazole-betamethasone (LOTRISONE) cream Apply 1 application topically 2 (two) times daily. Patient taking differently: Apply 1 application topically 2 (two) times daily as needed (FOR RASH).  03/12/15   Lorayne Marek, MD  colchicine 0.6 MG tablet Take 1 tablet (0.6 mg total) by mouth 2 (two) times daily. 12/18/15   Charolette Forward, MD  digoxin (LANOXIN) 0.125 MG tablet Take 1 tablet (0.125 mg total) by mouth daily. 12/18/15   Charolette Forward, MD  ergocalciferol (DRISDOL) 50000 units capsule Take 1 capsule (50,000 Units total) by mouth once a week. 11/20/17   Arnoldo Morale, MD  ferrous sulfate 325 (65 FE) MG tablet Take 325 mg by mouth daily with breakfast. Reported on 03/16/2016    [provider]  Fluticasone Furoate (ARNUITY ELLIPTA) 100 MCG/ACT AEPB Inhale 1 puff into the lungs daily. 11/13/17   Brand Males, MD  furosemide (LASIX) 20 MG tablet TAKE 1 TABLET (20 MG TOTAL) BY MOUTH DAILY. 11/19/17   Arnoldo Morale, MD  metoprolol succinate (TOPROL-XL) 25 MG 24 hr tablet Take 1 tablet (25 mg total) by mouth daily. 11/19/17   Arnoldo Morale, MD  nitroGLYCERIN (NITROSTAT) 0.4 MG SL tablet Place 1 tablet (0.4 mg total) under the tongue every 5 (five) minutes as needed for chest pain. 01/25/14   Dixie Dials, MD  pantoprazole (PROTONIX) 40 MG tablet Take 1 tablet (40 mg total) by mouth daily. 11/19/17   Arnoldo Morale, MD  sildenafil (REVATIO) 20 MG  tablet 2-5 pills as needed for ED symptoms 11/23/16   Renato Shin, MD  tamoxifen (NOLVADEX) 10 MG tablet Take 1 tablet (10 mg total) by mouth daily. 06/01/16   Renato Shin, MD  tamsulosin (FLOMAX) 0.4 MG CAPS capsule Take 1 capsule (0.4 mg total) by mouth daily. 11/19/17   Arnoldo Morale, MD    Physical Exam: Vitals:   12/01/17 0245 12/01/17 0315 12/01/17 0645 12/01/17 0751  BP: (!) 141/69 138/67  (!) 145/68  Pulse: 77 75 72 72  Resp: (!) 23 20 18 16   Temp:    98 F (36.7 C)  TempSrc:    Oral  SpO2: 100% 100% 99% 100%     General:  Appears calm and slightly uncomfortable Eyes:  PERRL, EOMI, normal lids, iris ENT:  grossly normal hearing, lips & tongue, mucous membranes of his mouth are pink slightly dry. Poor dentition Neck:  no LAD, masses or thyromegaly Cardiovascular:  RRR, no m/r/g. No LE edema.  Respiratory:  CTA bilaterally, no w/r/r. Normal respiratory effort. Abdomen:  soft, ntnd, obese positive bowel sounds throughout no guarding or rebounding Skin:  no rash or induration seen on limited exam Musculoskeletal:  Left shoulder obvious deformity. Left radial pulse present. Fingers warm and dry. Other joints without swelling/erythema Psychiatric:  grossly normal mood and affect, speech fluent and appropriate, AOx3 Neurologic:  CN 2-12 grossly intact, moves all extremities in coordinated fashion, sensation intact. Speech clear facial symmetry  Labs on Admission: I have personally reviewed following labs and imaging studies  CBC: Recent Labs  Lab 12/01/17 0504  WBC 13.3*  NEUTROABS 11.1*  HGB 11.7*  HCT 37.0*  MCV 70.5*  PLT 010   Basic Metabolic Panel: Recent Labs  Lab 12/01/17 0504  NA 140  K 4.3  CL 105  CO2 26  GLUCOSE 129*  BUN 17  CREATININE 1.28*  CALCIUM 8.7*   GFR: Estimated Creatinine Clearance: 63.3 mL/min (A) (by C-G formula based on SCr of 1.28 mg/dL (H)). Liver Function Tests: Recent Labs  Lab 12/01/17 0504  AST 28  ALT 21  ALKPHOS 89    BILITOT 0.2*  PROT 7.7  ALBUMIN 3.2*   No results for input(s): LIPASE, AMYLASE in the last 168 hours. No results for input(s): AMMONIA in the last 168 hours. Coagulation Profile: No results for input(s): INR, PROTIME in the last 168 hours. Cardiac Enzymes: Recent Labs  Lab 12/01/17 0726  TROPONINI <0.03   BNP (last 3 results) No results for input(s): PROBNP in the last 8760 hours. HbA1C: No results for input(s): HGBA1C in the last 72 hours. CBG: No results for input(s): GLUCAP in the last 168 hours. Lipid Profile: No results for input(s): CHOL, HDL, LDLCALC, TRIG, CHOLHDL, LDLDIRECT in the last 72 hours. Thyroid Function Tests: Recent Labs    12/01/17 0730  TSH 0.531   Anemia Panel: No results for input(s): VITAMINB12, FOLATE, FERRITIN, TIBC, IRON, RETICCTPCT in the last 72 hours. Urine analysis:    Component Value Date/Time   COLORURINE YELLOW 12/01/2017 0003   APPEARANCEUR HAZY (A) 12/01/2017 0003   LABSPEC 1.021 12/01/2017 0003   PHURINE 6.0 12/01/2017 0003   GLUCOSEU NEGATIVE 12/01/2017 0003   HGBUR NEGATIVE 12/01/2017 0003   BILIRUBINUR NEGATIVE 12/01/2017 0003   KETONESUR NEGATIVE 12/01/2017 0003   PROTEINUR NEGATIVE 12/01/2017 0003   UROBILINOGEN 1.0 01/26/2011 0647   NITRITE POSITIVE (A) 12/01/2017 0003   LEUKOCYTESUR SMALL (A) 12/01/2017 0003    Creatinine Clearance: Estimated Creatinine Clearance: 63.3 mL/min (A) (by C-G formula based on SCr of 1.28 mg/dL (H)).  Sepsis Labs: @LABRCNTIP (procalcitonin:4,lacticidven:4) )No results found for this or any previous visit (from the past 240 hour(s)).   Radiological Exams on Admission: Dg Shoulder Left Portable  Result Date: 12/01/2017 CLINICAL DATA:  Pain after fall EXAM: LEFT SHOULDER - 1 VIEW COMPARISON:  None. FINDINGS: The study is limited as only a single anterior view was obtained. There is an anterior dislocation identified. No fractures are seen. IMPRESSION: Limited study due to a single view.   Anterior dislocation. Electronically Signed   By: Dorise Bullion III M.D   On: 12/01/2017 00:55    EKG: Independently reviewed. Sinus rhythm Normal ECG When compared with ECG   Assessment/Plan Principal Problem:   Syncope Active Problems:   IRON DEFIC ANEMIA SEC DIET IRON INTAKE   Essential hypertension, benign   Atrial fibrillation ([facility])   Sick sinus syndrome ([facility])  Seizures (H[facility])   Gynecomastia   CHF (congestive heart failure) ([facility])   History of gastroesophageal reflux (GERD)   History of COPD   Dislocated shoulder   UTI (urinary tract infection)    1. Syncope. Etiology uncertain. Patient does have history of A. Fib, sick sinus syndrome and focal seizure no generalized seizures in the setting of urinary tract infection. EKG without acute changes. Chest x-ray pending. Troponin refused by patient. No chest pain or shortness of breath -Admit to telemetry -Cycle troponin -Follow dig level -Obtain an echocardiogram -Repeat EKG in the a.m. -EEG has some concern for seizure as it appeared patient was incontinent at seen. -Gentle IV fluids -Orthostatic vital signs -Antibiotics for his urinary tract infection  #2. Urinary tract infection. He was incontinent of urine at the scene. Urinalysis consistent with UTI. Mild leukocytosis. Afebrile hemodynamically stable. Provided with Rocephin in the emergency department -Follow urine culture -IV fluids -Continue Rocephin -CBC in the a.m.  #3. Dislocated shoulder. Left shoulder x-rays noted above. Dr. Marcelino Scot with orthopedics was contacted by the ED provider and will see patient -Supportive therapy -Outpatient follow-up  #4. History of A. fib. He EKG with sinus rhythm. [place] score 3. -Monitor on telemetry -Continue home amiodarone and metoprolol and dig -follow dig level  #5. Chronic diastolic heart failure. Echo done 2 years ago with an EF of 60% and grade 1 diastolic dysfunction. Appears compensated. Home medications include  amiodarone,  Lasix, metoprolol -Hold Lasix for now -Continue amiodarone and metoprolol -Daily weights -Intake and output  #6. History of seizures. Chart review indicates patient has a history of focal seizures no general seizures. The neuro note dated 05/2017 does indicate a concern for partial seizure activity and EEG done that was normal. MRI 2015 with evidence of multiple periventricular white matter disease. At that time patient started on keppra but he reports he has stopped this. today was indication of incontinence of urine. -EEG -note indicates patient refused any further evaluation/treatment  #7. Hypogonadism -continue home meds  #8. COPD. Not on oxygen at home. Chest x-ray refused due to dislocated shoulder. Appears stable  -continue home inhalers  #9. Hypertension. Blood pressure high end of normal in the emergency department. Likely related to pain. Home medications code Lasix and metoprolol -Continue metoprolol -Pain control -monitor  #10. Anemia. History of same. Hemoglobin 11.7. Chart review indicates this is close to his baseline. No signs symptoms of active bleeding -monitor   DVT prophylaxis: lovenox  Code Status: full  Family Communication: none present  Disposition Plan: home hopefully 24 ours  Consults called: dr handy  Admission status: obs    Radene Gunning MD [facility]  If 7PM-7AM, please contact night-coverage [email or web address] Password [facility]  12/01/2017, 8:48 AM

## 2017-12-01 NOTE — Consult Note (Signed)
Orthopaedic Trauma Service (OTS) Consult   Patient ID: [name] [name] MRN: [id number] DOB/AGE: [birth year] 66 y.o.   Reason for Consult: Left shoulder dislocation  Referring Physician:  Delora Fuel, MD (EDP)  Patient seen and evaluated with Dr. Marcelino Scot. HPI: [name] [name] is an 66 y.o.black male with complex medical history who had an unwitnessed event yesterday evening around 9:30 PM.  Patient only remembers waking up on the floor.  He was unable to get up.  With much difficulty he was able to crawl to a phone and called EMS.  He was brought to come the hospital for evaluation.  EMS noted that he had smell of urine all over him as well.  There is some question as to whether or not patient has seizures as some incontinence was noted in the past.  His medical history is notable for A. fib, diastolic congestive heart failure, CAD, sick sinus syndrome, COPD, GERD, BPH, hypogonadism. On workup in the emergency department patient was found to have a left shoulder dislocation as well.  Attempts were made at reduction however they were unsuccessful.  Orthopedics consulted for further treatment.  Patient seen and evaluated in the emergency room, E 68.  Complains of severe left shoulder pain.  He is having some tingling in his fingers as well as weakness.  He is right-hand dominant  Patient lives by himself  No history of shoulder dislocations in the past  Reports chronic SOB    Patient was seen and evaluated with Dr. Marcelino Scot  Past Medical History:  Diagnosis Date  . Anemia   . Asthma   . Atrial fibrillation ([place])   . CHF (congestive heart failure) ([place])   . COPD (chronic obstructive pulmonary disease) ([place])   . Heart disease   . Hypertension     Past Surgical History:  Procedure Laterality Date  . CARDIAC CATHETERIZATION N/A 12/08/2015   Procedure: Left Heart Cath and Coronary Angiography;  Surgeon: Charolette Forward, MD;  Location: [facility];  Service: Cardiovascular;  Laterality:  N/A;  . ESOPHAGUS SURGERY      Family History  Problem Relation Age of Onset  . Cancer Mother     Social History:  reports that  has never smoked. he has never used smokeless tobacco. He reports that he does not drink alcohol or use drugs.  Allergies: No Known Allergies  Medications:   No current facility-administered medications on file prior to encounter.    Current Outpatient Medications on File Prior to Encounter  Medication Sig Dispense Refill  . albuterol (PROVENTIL HFA;VENTOLIN HFA) 108 (90 Base) MCG/ACT inhaler Inhale 2 puffs into the lungs every 6 (six) hours as needed. 1 Inhaler 5  . albuterol (PROVENTIL) (2.5 MG/3ML) 0.083% nebulizer solution Take 3 mLs (2.5 mg total) by nebulization every 6 (six) hours as needed for wheezing or shortness of breath. 150 mL 1  . amiodarone (PACERONE) 200 MG tablet Take 1 tablet (200 mg total) by mouth 2 (two) times daily. 60 tablet 2  . aspirin EC 81 MG tablet Take 81 mg by mouth daily.      Marland Kitchen atorvastatin (LIPITOR) 40 MG tablet Take 1 tablet (40 mg total) by mouth daily at 6 PM. 30 tablet 5  . clomiPHENE (CLOMID) 50 MG tablet 1/4 tab daily 10 tablet 5  . clotrimazole-betamethasone (LOTRISONE) cream Apply 1 application topically 2 (two) times daily. (Patient taking differently: Apply 1 application topically 2 (two) times daily as needed (FOR RASH). ) 30 g 1  .  colchicine 0.6 MG tablet Take 1 tablet (0.6 mg total) by mouth 2 (two) times daily. 60 tablet 3  . digoxin (LANOXIN) 0.125 MG tablet Take 1 tablet (0.125 mg total) by mouth daily. 30 tablet 3  . ergocalciferol (DRISDOL) 50000 units capsule Take 1 capsule (50,000 Units total) by mouth once a week. 9 capsule 0  . ferrous sulfate 325 (65 FE) MG tablet Take 325 mg by mouth daily with breakfast. Reported on 03/16/2016    . Fluticasone Furoate (ARNUITY ELLIPTA) 100 MCG/ACT AEPB Inhale 1 puff into the lungs daily. 30 each 3  . furosemide (LASIX) 20 MG tablet TAKE 1 TABLET (20 MG TOTAL) BY MOUTH  DAILY. 30 tablet 5  . metoprolol succinate (TOPROL-XL) 25 MG 24 hr tablet Take 1 tablet (25 mg total) by mouth daily. 30 tablet 5  . nitroGLYCERIN (NITROSTAT) 0.4 MG SL tablet Place 1 tablet (0.4 mg total) under the tongue every 5 (five) minutes as needed for chest pain. 25 tablet 1  . pantoprazole (PROTONIX) 40 MG tablet Take 1 tablet (40 mg total) by mouth daily. 30 tablet 5  . sildenafil (REVATIO) 20 MG tablet 2-5 pills as needed for ED symptoms 50 tablet 10  . tamoxifen (NOLVADEX) 10 MG tablet Take 1 tablet (10 mg total) by mouth daily. 30 tablet 11  . tamsulosin (FLOMAX) 0.4 MG CAPS capsule Take 1 capsule (0.4 mg total) by mouth daily. 30 capsule 5     Results for orders placed or performed during the hospital encounter of 11/30/17 (from the past 48 hour(s))  Urinalysis, Routine w reflex microscopic     Status: Abnormal   Collection Time: 12/01/17 12:03 AM  Result Value Ref Range   Color, Urine YELLOW YELLOW   APPearance HAZY (A) CLEAR   Specific Gravity, Urine 1.021 1.005 - 1.030   pH 6.0 5.0 - 8.0   Glucose, UA NEGATIVE NEGATIVE mg/dL   Hgb urine dipstick NEGATIVE NEGATIVE   Bilirubin Urine NEGATIVE NEGATIVE   Ketones, ur NEGATIVE NEGATIVE mg/dL   Protein, ur NEGATIVE NEGATIVE mg/dL   Nitrite POSITIVE (A) NEGATIVE   Leukocytes, UA SMALL (A) NEGATIVE   RBC / HPF 0-5 0 - 5 RBC/hpf   WBC, UA 6-30 0 - 5 WBC/hpf   Bacteria, UA MANY (A) NONE SEEN   Squamous Epithelial / LPF 0-5 (A) NONE SEEN   Mucus PRESENT    Hyaline Casts, UA PRESENT   CBC with Differential     Status: Abnormal   Collection Time: 12/01/17  5:04 AM  Result Value Ref Range   WBC 13.3 (H) 4.0 - 10.5 K/uL   RBC 5.25 4.22 - 5.81 MIL/uL   Hemoglobin 11.7 (L) 13.0 - 17.0 g/dL   HCT 37.0 (L) 39.0 - 52.0 %   MCV 70.5 (L) 78.0 - 100.0 fL   MCH 22.3 (L) 26.0 - 34.0 pg   MCHC 31.6 30.0 - 36.0 g/dL   RDW 16.1 (H) 11.5 - 15.5 %   Platelets 280 150 - 400 K/uL   Neutrophils Relative % 84 %   Neutro Abs 11.1 (H) 1.7 -  7.7 K/uL   Lymphocytes Relative 10 %   Lymphs Abs 1.3 0.7 - 4.0 K/uL   Monocytes Relative 6 %   Monocytes Absolute 0.8 0.1 - 1.0 K/uL   Eosinophils Relative 0 %   Eosinophils Absolute 0.0 0.0 - 0.7 K/uL   Basophils Relative 0 %   Basophils Absolute 0.0 0.0 - 0.1 K/uL  Comprehensive metabolic panel  Status: Abnormal   Collection Time: 12/01/17  5:04 AM  Result Value Ref Range   Sodium 140 135 - 145 mmol/L   Potassium 4.3 3.5 - 5.1 mmol/L   Chloride 105 101 - 111 mmol/L   CO2 26 22 - 32 mmol/L   Glucose, Bld 129 (H) 65 - 99 mg/dL   BUN 17 6 - 20 mg/dL   Creatinine, Ser 1.28 (H) 0.61 - 1.24 mg/dL   Calcium 8.7 (L) 8.9 - 10.3 mg/dL   Total Protein 7.7 6.5 - 8.1 g/dL   Albumin 3.2 (L) 3.5 - 5.0 g/dL   AST 28 15 - 41 U/L   ALT 21 17 - 63 U/L   Alkaline Phosphatase 89 38 - 126 U/L   Total Bilirubin 0.2 (L) 0.3 - 1.2 mg/dL   GFR calc non Af Amer 56 (L) >60 mL/min   GFR calc Af Amer >60 >60 mL/min    Comment: (NOTE) The eGFR has been calculated using the CKD EPI equation. This calculation has not been validated in all clinical situations. eGFR's persistently <60 mL/min signify possible Chronic Kidney Disease.    Anion gap 9 5 - 15  Digoxin level     Status: Abnormal   Collection Time: 12/01/17  7:26 AM  Result Value Ref Range   Digoxin Level <0.2 (L) 0.8 - 2.0 ng/mL    Comment: RESULTS CONFIRMED BY MANUAL DILUTION  D-dimer, quantitative (not at [facility])     Status: Abnormal   Collection Time: 12/01/17  7:26 AM  Result Value Ref Range   D-Dimer, Quant 1.00 (H) 0.00 - 0.50 ug/mL-FEU    Comment: (NOTE) At the manufacturer cut-off of 0.50 ug/mL FEU, this assay has been documented to exclude PE with a sensitivity and negative predictive value of 97 to 99%.  At this time, this assay has not been approved by the FDA to exclude DVT/VTE. Results should be correlated with clinical presentation.   Troponin I     Status: None   Collection Time: 12/01/17  7:26 AM  Result Value Ref  Range   Troponin I <0.03 <0.03 ng/mL  TSH     Status: None   Collection Time: 12/01/17  7:30 AM  Result Value Ref Range   TSH 0.531 0.350 - 4.500 uIU/mL    Comment: Performed by a 3rd Generation assay with a functional sensitivity of <=0.01 uIU/mL.    Dg Shoulder Left Portable  Result Date: 12/01/2017 CLINICAL DATA:  Pain after fall EXAM: LEFT SHOULDER - 1 VIEW COMPARISON:  None. FINDINGS: The study is limited as only a single anterior view was obtained. There is an anterior dislocation identified. No fractures are seen. IMPRESSION: Limited study due to a single view.  Anterior dislocation. Electronically Signed   By: Dorise Bullion III M.D   On: 12/01/2017 00:55    Review of Systems  Constitutional: Negative for chills and fever.  Cardiovascular: Negative for chest pain.  Gastrointestinal: Negative for nausea and vomiting.   Blood pressure (!) 145/68, pulse 72, temperature 98 F (36.7 C), temperature source Oral, resp. rate 16, SpO2 100 %. Physical Exam  Constitutional: He is oriented to person, place, and time. He is cooperative.  Overall older appearing black male Appears very uncomfortable  Musculoskeletal:  Left Upper Extremity  + deformity and step-off to L shoulder Unable to move left shoulder TTP L shoulder Clavicle, [place] and AC joints are nontender Elbow, forearm, wrist and hand are nontender Tolerates gentle elbow, forearm, wrist and hand ROM without  much discomfort Radial, ulnar, median nerve sensation intact but diminished Motor functions grossly intact but are weak along all distributions  + radial pulse   Neurological: He is alert and oriented to person, place, and time.     Assessment/Plan:  66 year old male with an unwitnessed syncopal event at home with acute left shoulder dislocation  -Syncopal event  Per medical service  -Left shoulder dislocation  Appears to be an anterior shoulder dislocation however there is only a single shoulder view  available  Attempt at reduction was made in the emergency department which was unsuccessful.  Believe that the safest course of action would be to take him to the OR in a more controlled environment for further attempts at reduction.  Patient is in agreement with this.  He will be immobilized for brief period of time and then gentle range of motion will begin.   - Pain management:  Pain relief should be much improved after reduction  Will minimize narcotics post reduction.  Tylenol and anti-inflammatories   - Medical issues   Per medical team  - DVT/PE prophylaxis:  Per medical team   - Dispo:  OR for closed reduction of left shoulder   Jari Pigg, PA-C [facility] [phone] (C) [phone] (O) 12/01/2017, 9:29 AM   I saw and examined the patient with Mr. Eddie Dibbles, communicating the findings and plan noted above.  Left shoulder dislocation with unsuccessful reduction attempt under propofol by EDP. Will proceed to the OR for attempted closed reduction, open if necessary.  I discussed with the patient the risks and benefits of surgery, including the possibility of infection, nerve injury, vessel injury, wound breakdown, arthritis, recurrent dislocation or instability, DVT/ PE, loss of motion, malunion, nonunion, and need for further surgery among others.   He acknowledged these risks and wished to proceed.   Altamese [place], MD [facility] [phone] (p)

## 2017-12-01 NOTE — Anesthesia Postprocedure Evaluation (Signed)
Anesthesia Post Note  Patient: [name] [name]  Procedure(s) Performed: CLOSED REDUCTION SHOULDER (Left Shoulder)     Patient location during evaluation: PACU Anesthesia Type: General Level of consciousness: awake and alert Pain management: pain level controlled Vital Signs Assessment: post-procedure vital signs reviewed and stable Respiratory status: spontaneous breathing, nonlabored ventilation, respiratory function stable and patient connected to nasal cannula oxygen Cardiovascular status: blood pressure returned to baseline and stable Postop Assessment: no apparent nausea or vomiting Anesthetic complications: no    Last Vitals:  Vitals:   12/01/17 1248 12/01/17 1300  BP: 134/87 (!) 149/81  Pulse: 73 68  Resp: 15 18  Temp: 36.5 C   SpO2: 100% 99%    Last Pain:  Vitals:   12/01/17 1300  TempSrc:   PainSc: [name]

## 2017-12-01 NOTE — ED Notes (Signed)
Pt refuse blood draw.

## 2017-12-01 NOTE — Brief Op Note (Signed)
12/01/2017  12:59 PM  PATIENT:  Louretta Shorten  66 y.o. male  PRE-OPERATIVE DIAGNOSIS:  left shoulder dislocation  POST-OPERATIVE DIAGNOSIS:  left shoulder dislocation  PROCEDURE:  Procedure(s): 1. CLOSED REDUCTION SHOULDER (Left)  2. Stress examination under flouro  SURGEON:  Surgeon(s) and Role:    Altamese , MD - Primary  PHYSICIAN ASSISTANT: Ainsley Spinner, PA-C  ANESTHESIA:   general  EBL: 0  BLOOD ADMINISTERED:none  DRAINS: none   LOCAL MEDICATIONS USED:  NONE  SPECIMEN:  No Specimen  DISPOSITION OF SPECIMEN:  N/A  COUNTS:  N/A  TOURNIQUET:  * No tourniquets in log *  DICTATION: .Other Dictation: Dictation Number [id number]  PLAN OF CARE: Admit to inpatient   PATIENT DISPOSITION:  PACU - hemodynamically stable.   Delay start of Pharmacological VTE agent (>24hrs) due to surgical blood loss or risk of bleeding: not applicable

## 2017-12-02 ENCOUNTER — Observation Stay (HOSPITAL_COMMUNITY): Payer: Medicare HMO

## 2017-12-02 ENCOUNTER — Encounter (HOSPITAL_COMMUNITY): Payer: Self-pay | Admitting: Orthopedic Surgery

## 2017-12-02 DIAGNOSIS — N400 Enlarged prostate without lower urinary tract symptoms: Secondary | ICD-10-CM | POA: Diagnosis present

## 2017-12-02 DIAGNOSIS — I4891 Unspecified atrial fibrillation: Secondary | ICD-10-CM

## 2017-12-02 DIAGNOSIS — D509 Iron deficiency anemia, unspecified: Secondary | ICD-10-CM | POA: Diagnosis present

## 2017-12-02 DIAGNOSIS — Z79899 Other long term (current) drug therapy: Secondary | ICD-10-CM | POA: Diagnosis not present

## 2017-12-02 DIAGNOSIS — R7989 Other specified abnormal findings of blood chemistry: Secondary | ICD-10-CM | POA: Diagnosis not present

## 2017-12-02 DIAGNOSIS — N390 Urinary tract infection, site not specified: Secondary | ICD-10-CM | POA: Diagnosis present

## 2017-12-02 DIAGNOSIS — K219 Gastro-esophageal reflux disease without esophagitis: Secondary | ICD-10-CM | POA: Diagnosis present

## 2017-12-02 DIAGNOSIS — S43015A Anterior dislocation of left humerus, initial encounter: Principal | ICD-10-CM

## 2017-12-02 DIAGNOSIS — I5032 Chronic diastolic (congestive) heart failure: Secondary | ICD-10-CM | POA: Diagnosis present

## 2017-12-02 DIAGNOSIS — W1839XA Other fall on same level, initial encounter: Secondary | ICD-10-CM | POA: Diagnosis present

## 2017-12-02 DIAGNOSIS — N62 Hypertrophy of breast: Secondary | ICD-10-CM | POA: Diagnosis present

## 2017-12-02 DIAGNOSIS — S43005D Unspecified dislocation of left shoulder joint, subsequent encounter: Secondary | ICD-10-CM

## 2017-12-02 DIAGNOSIS — I110 Hypertensive heart disease with heart failure: Secondary | ICD-10-CM | POA: Diagnosis present

## 2017-12-02 DIAGNOSIS — J449 Chronic obstructive pulmonary disease, unspecified: Secondary | ICD-10-CM | POA: Diagnosis present

## 2017-12-02 DIAGNOSIS — E291 Testicular hypofunction: Secondary | ICD-10-CM | POA: Diagnosis present

## 2017-12-02 DIAGNOSIS — N289 Disorder of kidney and ureter, unspecified: Secondary | ICD-10-CM | POA: Diagnosis present

## 2017-12-02 DIAGNOSIS — R55 Syncope and collapse: Secondary | ICD-10-CM | POA: Diagnosis present

## 2017-12-02 DIAGNOSIS — G4089 Other seizures: Secondary | ICD-10-CM | POA: Diagnosis present

## 2017-12-02 DIAGNOSIS — Z79810 Long term (current) use of selective estrogen receptor modulators (SERMs): Secondary | ICD-10-CM | POA: Diagnosis not present

## 2017-12-02 DIAGNOSIS — I495 Sick sinus syndrome: Secondary | ICD-10-CM | POA: Diagnosis present

## 2017-12-02 DIAGNOSIS — I482 Chronic atrial fibrillation: Secondary | ICD-10-CM | POA: Diagnosis present

## 2017-12-02 DIAGNOSIS — Z7982 Long term (current) use of aspirin: Secondary | ICD-10-CM | POA: Diagnosis not present

## 2017-12-02 DIAGNOSIS — Y92009 Unspecified place in unspecified non-institutional (private) residence as the place of occurrence of the external cause: Secondary | ICD-10-CM | POA: Diagnosis not present

## 2017-12-02 DIAGNOSIS — I480 Paroxysmal atrial fibrillation: Secondary | ICD-10-CM | POA: Diagnosis present

## 2017-12-02 DIAGNOSIS — I2510 Atherosclerotic heart disease of native coronary artery without angina pectoris: Secondary | ICD-10-CM | POA: Diagnosis present

## 2017-12-02 DIAGNOSIS — Z9181 History of falling: Secondary | ICD-10-CM | POA: Diagnosis not present

## 2017-12-02 DIAGNOSIS — R32 Unspecified urinary incontinence: Secondary | ICD-10-CM | POA: Diagnosis present

## 2017-12-02 LAB — BASIC METABOLIC PANEL
ANION GAP: 9 (ref 5–15)
BUN: 18 mg/dL (ref 6–20)
CHLORIDE: 107 mmol/L (ref 101–111)
CO2: 20 mmol/L — AB (ref 22–32)
Calcium: 8.5 mg/dL — ABNORMAL LOW (ref 8.9–10.3)
Creatinine, Ser: 1.18 mg/dL (ref 0.61–1.24)
GFR calc Af Amer: >60 mL/min (ref >60)
GFR calc non Af Amer: >60 mL/min (ref >60)
GLUCOSE: 88 mg/dL (ref 65–99)
POTASSIUM: 4 mmol/L (ref 3.5–5.1)
Sodium: 136 mmol/L (ref 135–145)

## 2017-12-02 LAB — URINE CULTURE

## 2017-12-02 LAB — CBC
HEMATOCRIT: 34.8 % — AB (ref 39.0–52.0)
HEMOGLOBIN: 10.8 g/dL — AB (ref 13.0–17.0)
MCH: 21.5 pg — ABNORMAL LOW (ref 26.0–34.0)
MCHC: 31 g/dL (ref 30.0–36.0)
MCV: 69.3 fL — ABNORMAL LOW (ref 78.0–100.0)
Platelets: 282 10*3/uL (ref 150–400)
RBC: 5.02 MIL/uL (ref 4.22–5.81)
RDW: 15.8 % — AB (ref 11.5–15.5)
WBC: 10.6 10*3/uL — ABNORMAL HIGH (ref 4.0–10.5)

## 2017-12-02 LAB — GLUCOSE, CAPILLARY: Glucose-Capillary: 83 mg/dL (ref 65–99)

## 2017-12-02 MED ORDER — IOPAMIDOL (ISOVUE-370) INJECTION 76%
INTRAVENOUS | Status: AC
  Administered 2017-12-02: 100 mL
  Filled 2017-12-02: qty 100

## 2017-12-02 MED ORDER — HYDROXYZINE HCL 10 MG PO TABS
10.0000 mg | ORAL_TABLET | Freq: Two times a day (BID) | ORAL | Status: DC
  Administered 2017-12-02 (×2): 10 mg via ORAL
  Filled 2017-12-02 (×6): qty 1

## 2017-12-02 NOTE — Progress Notes (Signed)
Orthopedic Trauma Service Progress Note   Patient ID: [name] [name] MRN: [id number] DOB/AGE: 1951-09-22 66 y.o.  Subjective:  Doing okay this morning Left shoulder is sore but otherwise okay No other complaints   ROS As above Objective:   VITALS:   Vitals:   12/01/17 2025 12/01/17 2216 12/02/17 0547 12/02/17 0821  BP:  115/70 137/81   Pulse:  73 67   Resp:  16 16   Temp:  98.6 F (37 C) 98.2 F (36.8 C)   TempSrc:  Oral Oral   SpO2: 100% 98% 100% 99%  Weight:   105.7 kg (233 lb 0.4 oz)     Estimated body mass index is 36.5 kg/m as calculated from the following:   Height as of 11/19/17: 5\' 7"  (1.702 m).   Weight as of this encounter: 105.7 kg (233 lb 0.4 oz).   Intake/Output      12/08 0701 - 12/09 0700 12/09 0701 - 12/10 0700   I.V. (mL/kg) 1055.8 (10)    IV Piggyback 0    Total Intake(mL/kg) 1055.8 (10)    Urine (mL/kg/hr) 1625 (0.6)    Total Output 1625    Net -569.2           LABS  Results for orders placed or performed during the hospital encounter of 11/30/17 (from the past 24 hour(s))  Troponin I     Status: None   Collection Time: 12/01/17  3:00 PM  Result Value Ref Range   Troponin I <0.03 <0.03 ng/mL  Troponin I     Status: None   Collection Time: 12/01/17  8:13 PM  Result Value Ref Range   Troponin I <0.03 <0.03 ng/mL  CBC with Differential/Platelet     Status: Abnormal   Collection Time: 12/01/17 10:42 PM  Result Value Ref Range   WBC 9.7 4.0 - 10.5 K/uL   RBC 5.16 4.22 - 5.81 MIL/uL   Hemoglobin 11.3 (L) 13.0 - 17.0 g/dL   HCT 36.0 (L) 39.0 - 52.0 %   MCV 69.8 (L) 78.0 - 100.0 fL   MCH 21.9 (L) 26.0 - 34.0 pg   MCHC 31.4 30.0 - 36.0 g/dL   RDW 16.1 (H) 11.5 - 15.5 %   Platelets 312 150 - 400 K/uL   Neutrophils Relative % 63 %   Lymphocytes Relative 22 %   Monocytes Relative 12 %   Eosinophils Relative 3 %   Basophils Relative 0 %   Neutro Abs 6.1 1.7 - 7.7 K/uL   Lymphs Abs 2.1 0.7 - 4.0 K/uL   Monocytes  Absolute 1.2 (H) 0.1 - 1.0 K/uL   Eosinophils Absolute 0.3 0.0 - 0.7 K/uL   Basophils Absolute 0.0 0.0 - 0.1 K/uL   RBC Morphology ELLIPTOCYTES   Basic metabolic panel     Status: Abnormal   Collection Time: 12/02/17  5:30 AM  Result Value Ref Range   Sodium 136 135 - 145 mmol/L   Potassium 4.0 3.5 - 5.1 mmol/L   Chloride 107 101 - 111 mmol/L   CO2 20 (L) 22 - 32 mmol/L   Glucose, Bld 88 65 - 99 mg/dL   BUN 18 6 - 20 mg/dL   Creatinine, Ser 1.18 0.61 - 1.24 mg/dL   Calcium 8.5 (L) 8.9 - 10.3 mg/dL   GFR calc non Af Amer >60 >60 mL/min   GFR calc Af Amer >60 >60 mL/min   Anion gap 9 5 - 15  CBC     Status: Abnormal  Collection Time: 12/02/17  5:30 AM  Result Value Ref Range   WBC 10.6 (H) 4.0 - 10.5 K/uL   RBC 5.02 4.22 - 5.81 MIL/uL   Hemoglobin 10.8 (L) 13.0 - 17.0 g/dL   HCT 34.8 (L) 39.0 - 52.0 %   MCV 69.3 (L) 78.0 - 100.0 fL   MCH 21.5 (L) 26.0 - 34.0 pg   MCHC 31.0 30.0 - 36.0 g/dL   RDW 15.8 (H) 11.5 - 15.5 %   Platelets 282 150 - 400 K/uL  Glucose, capillary     Status: None   Collection Time: 12/02/17  6:54 AM  Result Value Ref Range   Glucose-Capillary 83 65 - 99 mg/dL     PHYSICAL EXAM:   Gen: Patient is sitting in his bedside chair, no acute distress, watching television Ext:       Left upper extremity  Sling is fitting well  Extremity is warm  Brisk capillary refill  + Radial pulse  Radial, ulnar, median, axillary nerve sensory functions are intact  Radial, ulnar, median, axillary, AIN, PIN and motor functions are intact  Patient can perform active elbow flexion extension without difficulty  No significant swelling  Assessment/Plan: 1 Day Post-Op   Principal Problem:   Syncope Active Problems:   IRON DEFIC ANEMIA SEC DIET IRON INTAKE   Essential hypertension, benign   Atrial fibrillation (HCC)   Sick sinus syndrome (HCC)   Seizures (HCC)   Gynecomastia   CHF (congestive heart failure) (HCC)   History of gastroesophageal reflux (GERD)    History of COPD   Dislocated shoulder   UTI (urinary tract infection)   Anti-infectives (From admission, onward)   Start     Dose/Rate Route Frequency Ordered Stop   12/02/17 0800  cefTRIAXone (ROCEPHIN) 1 g in dextrose 5 % 50 mL IVPB     1 g 100 mL/hr over 30 Minutes Intravenous Every 24 hours 12/01/17 0707     12/01/17 0545  cefTRIAXone (ROCEPHIN) 1 g in dextrose 5 % 50 mL IVPB     1 g 100 mL/hr over 30 Minutes Intravenous  Once 12/01/17 0544 12/01/17 0633    .  POD/HD#: 48  66 year old male with an unwitnessed syncopal event at home with acute left shoulder dislocation   -Syncopal event             Per medical service   -Left anterior shoulder dislocation s/p closed reduction in the OR  Sling for comfort  Ice as needed  PT and OT evaluation  Okay to perform gentle shoulder pendulums as well as gentle shoulder flexion and extension.  Limit rotation at this time particularly external rotation  Unrestricted range of motion of the elbow, forearm, wrist and hand  No lifting greater than 5 pounds with left arm  Will see back in office in 7-10 days to arrange outpt PT/OT   - Pain management:            Recommend Tylenol and anti-inflammatories  Minimize narcotic use     - Medical issues              Per medical team   - DVT/PE prophylaxis:             Per medical team     - Dispo:             Therapy evaluations  Orthopedic issues addressed  Please call with further questions  Follow-up with orthopedics in 7-10 days     Lanny Hurst  Silvestre Moment, PA-C [facility] [phone] (C) 12/02/2017, 10:05 AM

## 2017-12-02 NOTE — Progress Notes (Signed)
  Echocardiogram 2D Echocardiogram has been performed.  [name] [name] 12/02/2017, 5:11 PM

## 2017-12-02 NOTE — Progress Notes (Signed)
Pt had blood in urine when he voided did not state any discomfort, pt was also bleeding from inner thigh from scratching. RN notified MD. MD ordered urine analysis and atarax to stop itching. Will continue to monitor

## 2017-12-02 NOTE — Op Note (Signed)
NAME:  BURNS, TIMSON                     ACCOUNT NO.:  MEDICAL RECORD NO.:  [id number]  LOCATION:                                 FACILITY:  PHYSICIAN:  Astrid Divine. Marcelino Scot, M.D.      DATE OF BIRTH:  DATE OF PROCEDURE:  12/01/2017 DATE OF DISCHARGE:                              OPERATIVE REPORT   POSTOPERATIVE DIAGNOSIS:  Left shoulder dislocation.  POSTOPERATIVE DIAGNOSIS:  Left shoulder dislocation.  PROCEDURES: 1. Closed reduction of left shoulder under general anesthesia. 2. Stress examination of left shoulder under fluoro.  SURGEON:  Astrid Divine. Marcelino Scot, M.D.  ASSISTANT:  Ainsley Spinner, PA-C.  ANESTHESIA:  General.  COMPLICATIONS:  None.  DISPOSITION:  To PACU.  CONDITION:  Stable.  BRIEF SUMMARY OF INDICATION FOR PROCEDURE:  [name] [name] is a 66 year old male, who appeared to have a syncopal or seizure event, after which found himself on the floor with left shoulder pain, inability to range it.  He was taken to the emergency department by EMS where evaluation was performed, but the closed reduction maneuver under propofol was unsuccessful.  As a consequence of this, we discussed with him the possibility of closed reduction in the operating room with full muscle relaxation.  He strongly wished to proceed.  Risks discussed included the potential for open reduction, persistent instability, recurrent instability, nerve injury, vessel injury, and others.  After full discussion, he did wish to proceed again.  BRIEF SUMMARY OF PROCEDURE:  The patient was taken to the operating room where general anesthesia was induced.  I then stabilized the glenoid while my assistant Ainsley Spinner pulled longitudinal traction.  I also was able to digitally palpate the humeral head and contribute to some of the mobility and lateralization, the humeral head then reduced smoothly.  We brought in the C-arm and obtained an AP and axillary view.  Then, we stressed it with external rotation and took a  picture which showed no subluxation or significant Hill-Sachs lesion, there was a small indentation. Internal rotation views also were obtained.  These also showed a concentric reduction.  The patient was placed in a sling, awakened anesthesia and transferred to PACU in stable condition.  PROGNOSIS:  The patient will begin gentle therapy with dislocation protocol.  We will follow him for evaluation of both his rotator cuff as well as the axillary nerve.  We will plan to see him back in the office after discharge in a week to make sure he is being sufficiently aggressive at his age group to avoid stiffness.  He will remain on the Medical Service for further evaluation of his other difficulties including seizure and UTI.     Astrid Divine. Marcelino Scot, M.D.     MHH/MEDQ  D:  12/01/2017  T:  12/01/2017  Job:  [id number]

## 2017-12-02 NOTE — Care Management Note (Signed)
Case Management Note  Patient Details  Name: [name] [name] MRN: [id number] Date of Birth: 1951-08-29  Subjective/Objective:      Pt from home alone and presents for syncope, left shoulder dislocation, and UTI.  Pt states he is usually independent, uses no DME at home, and drives.  Pt states he has never used [facility] services but a nurse has come to his house in the past, whom he thinks is from [place].  Pt is a [facility] participant.            Action/Plan: MOON delivered.  CM will follow for any d/c needs.   Expected Discharge Date:                  Expected Discharge Plan:  Home/Self Care  In-House Referral:  NA  Discharge planning Services  CM Consult  Post Acute Care Choice:    Choice offered to:     DME Arranged:    DME Agency:     HH Arranged:    HH Agency:     Status of Service:  In process, will continue to follow  If discussed at Long Length of Stay Meetings, dates discussed:    Additional Comments:  Arley Phenix, RN 12/02/2017, 10:41 AM

## 2017-12-02 NOTE — Care Management Obs Status (Signed)
[facility] NOTIFICATION   Patient Details  Name: [name] [name] MRN: [id number] Date of Birth: 07/15/1951   Medicare Observation Status Notification Given:  Yes    Arley Phenix, RN 12/02/2017, 10:32 AM

## 2017-12-02 NOTE — Progress Notes (Signed)
Patient is refusing morning labs. MD notified.

## 2017-12-02 NOTE — Progress Notes (Addendum)
[facility]                                                                              Patient Demographics  [name] [name], is a 66 y.o. male, DOB - Oct 20, 1951, CBS:[id number]  Admit date - 11/30/2017   Admitting Physician Lady Deutscher, MD  Outpatient Primary MD for the patient is Arnoldo Morale, MD  Outpatient specialists:   LOS - 0  days   Medical records reviewed and are as summarized below:    Chief Complaint  Patient presents with  . Loss of Consciousness  . Fall       Brief summary  Patient is a 66 year old male with paroxysmal A. fib, diastolic CHF, CAD, sick sinus syndrome, COPD not on home O2, GERD, BPH presented with syncope at home, resulting in fall and dislocated left shoulder.  UA also showed UTI.  Assessment & Plan    Principal Problem:   Syncope -In the setting of UTI, has a history of focal seizures, atrial fibrillation, sick sinus syndrome -Troponins x3 neg, no acute arrhythmias -Continue gentle IV fluid hydration, IV antibiotics for UTI -Obtain 2D echo, EEG, PT evaluate -D-dimer slightly elevated, obtain CT angiogram of the chest  Active Problems:  Left dislocated shoulder -Status post closed reduction in OR, appreciate orthopedic recommendations, Dr. Marcelino Scot -Continue sling, further recommendations by orthopedics    UTI (urinary tract infection) -Follow urine cultures and sensitivities, continue IV Rocephin  History of seizures -Patient has a history of focal seizures, no grand mal/generalized seizures.  EEG in 6/18 was normal, neuro note indicated concern for partial seizure activity.Patient was started on Keppra but he reported that he had stopped it -Repeat EEG, if positive for focal seizure, may need to restart Keppra.  COPD, not on any O2 at home -Currently stable, no wheezing, continue home inhalers  Hypertension -Continue pain control and metoprolol, BP now stable  History of diastolic dysfunction -2D echo in  12/16 showed EF of 50-55% with grade 1 diastolic dysfunction -Currently stable, compensated, may resume Lasix in a.m.  Atrial fibrillation, chronic -Continue amiodarone, metoprolol -Not on any anticoagulation due to risk of falls  Anemia -Microcytic, chronic, baseline 10-11, close to baseline -No obvious GI bleeding, outpatient follow-up with PCP  Code Status: Full code DVT Prophylaxis:  SCD's Family Communication: Discussed in detail with the patient, all imaging results, lab results explained to the patient   Disposition Plan: Will need PT eval, likely DC in a.m.  Time Spent in minutes   35 minutes  Procedures:  None  Consultants:   Orthopedics  Antimicrobials:   IV Rocephin 12/8>   Medications  Scheduled Meds: . amiodarone  200 mg Oral BID  . aspirin EC  81 mg Oral Daily  . atorvastatin  40 mg Oral q1800  . budesonide  0.25 mg Inhalation BID  . Chlorhexidine Gluconate Cloth  6 each Topical Q0600  . colchicine  0.6 mg Oral BID  . digoxin  0.125 mg Oral Daily  . ferrous sulfate  325 mg Oral Q breakfast  . metoprolol succinate  25 mg Oral Daily  . mupirocin ointment  1 application Nasal  BID  . pantoprazole  40 mg Oral Daily  . sodium chloride flush  3 mL Intravenous Q12H  . tamsulosin  0.4 mg Oral Daily   Continuous Infusions: . cefTRIAXone (ROCEPHIN)  IV 1 g (12/02/17 0815)  . lactated ringers    . methocarbamol (ROBAXIN)  IV     PRN Meds:.acetaminophen **OR** acetaminophen, albuterol, HYDROcodone-acetaminophen, ondansetron **OR** ondansetron (ZOFRAN) IV, senna-docusate, traZODone   Antibiotics   Anti-infectives (From admission, onward)   Start     Dose/Rate Route Frequency Ordered Stop   12/02/17 0800  cefTRIAXone (ROCEPHIN) 1 g in dextrose 5 % 50 mL IVPB     1 g 100 mL/hr over 30 Minutes Intravenous Every 24 hours 12/01/17 0707     12/01/17 0545  cefTRIAXone (ROCEPHIN) 1 g in dextrose 5 % 50 mL IVPB     1 g 100 mL/hr over 30 Minutes Intravenous  Once  12/01/17 0544 12/01/17 7824        Subjective:   [name] [name] was seen and examined today. Patient denies dizziness, chest pain, shortness of breath, abdominal pain, N/V/D/C, new weakness, numbess, tingling.  No fevers.  Pain is controlled in the left shoulder  Objective:   Vitals:   12/01/17 2025 12/01/17 2216 12/02/17 0547 12/02/17 0821  BP:  115/70 137/81   Pulse:  73 67   Resp:  16 16   Temp:  98.6 F (37 C) 98.2 F (36.8 C)   TempSrc:  Oral Oral   SpO2: 100% 98% 100% 99%  Weight:   105.7 kg (233 lb 0.4 oz)     Intake/Output Summary (Last 24 hours) at 12/02/2017 0936 Last data filed at 12/02/2017 0551 Gross per 24 hour  Intake 1055.83 ml  Output 1625 ml  Net -569.17 ml     Wt Readings from Last 3 Encounters:  12/02/17 105.7 kg (233 lb 0.4 oz)  11/22/17 103.4 kg (228 lb)  11/19/17 105.1 kg (231 lb 9.6 oz)     Exam  General: Alert and oriented x 3, NAD  Eyes: PERRLA, EOMI, Anicteric Sclera,  HEENT:  Atraumatic, normocephalic, normal oropharynx  Cardiovascular: S1 S2 auscultated, no rubs, murmurs or gallops. Regular rate and rhythm.  Respiratory: Clear to auscultation bilaterally, no wheezing, rales or rhonchi  Gastrointestinal: Soft, nontender, nondistended, + bowel sounds  Ext: no pedal edema bilaterally  Neuro: no new deficits.  Left arm in sling, strength 5/5 in bilateral lower extremities  musculoskeletal: No digital cyanosis, clubbing, left arm in sling  Skin: No rashes  Psych: Normal affect and demeanor, alert and oriented x3    Data Reviewed:  I have personally reviewed following labs and imaging studies  Micro Results Recent Results (from the past 240 hour(s))  Urine culture     Status: Abnormal   Collection Time: 12/01/17  6:43 AM  Result Value Ref Range Status   Specimen Description URINE, CLEAN CATCH  Final   Special Requests NONE  Final   Culture MULTIPLE SPECIES PRESENT, SUGGEST RECOLLECTION (A)  Final   Report Status 12/02/2017  FINAL  Final    Radiology Reports Dg Chest Port 1 View  Result Date: 12/01/2017 CLINICAL DATA:  Anterior shoulder dislocation EXAM: PORTABLE CHEST 1 VIEW COMPARISON:  Radiograph 01/24/2016 FINDINGS: Anterior LEFT shoulder dislocation. Normal cardiac silhouette.  No pneumothorax.  No rib fracture. IMPRESSION: LEFT anterior shoulder dislocation. Electronically Signed   By: Suzy Bouchard M.D.   On: 12/01/2017 09:55   Dg Shoulder Left  Result Date: 12/01/2017 CLINICAL DATA:  66 year old may male status post reduction of left shoulder dislocation. EXAM: LEFT SHOULDER - 2+ VIEW COMPARISON:  Radiographs from earlier the same day. FINDINGS: There has been interval reduction of the previously noted anterior shoulder dislocation. Osseous irregularity along the inferior glenoid may represent a subtle Bankart lesion. No other definitive fractures noted. IMPRESSION: Interval reduction of previously noted anterior shoulder dislocation. Osseous irregularity at the inferior glenoid may represent a subtle bony Bankart lesion. Electronically Signed   By: Kristopher Oppenheim M.D.   On: 12/01/2017 14:04   Dg Shoulder Left  Result Date: 12/01/2017 CLINICAL DATA:  Close reduction of left shoulder dislocation. EXAM: DG C-ARM 61-120 MIN; LEFT SHOULDER - 2+ VIEW COMPARISON:  Left shoulder radiographs of the same day. FLUOROSCOPY TIME:  Fluoroscopy Time:  8 seconds Number of Acquired Spot Images: 0 FINDINGS: Fluoroscopic images demonstrate reduction of the left shoulder dislocation. There is no fracture. IMPRESSION: Interval reduction of the left shoulder without evidence for fracture. Electronically Signed   By: San Morelle M.D.   On: 12/01/2017 13:49   Dg Shoulder Left Portable  Result Date: 12/01/2017 CLINICAL DATA:  Pain after fall EXAM: LEFT SHOULDER - 1 VIEW COMPARISON:  None. FINDINGS: The study is limited as only a single anterior view was obtained. There is an anterior dislocation identified. No fractures  are seen. IMPRESSION: Limited study due to a single view.  Anterior dislocation. Electronically Signed   By: Dorise Bullion III M.D   On: 12/01/2017 00:55   Dg C-arm 1-60 Min  Result Date: 12/01/2017 CLINICAL DATA:  Close reduction of left shoulder dislocation. EXAM: DG C-ARM 61-120 MIN; LEFT SHOULDER - 2+ VIEW COMPARISON:  Left shoulder radiographs of the same day. FLUOROSCOPY TIME:  Fluoroscopy Time:  8 seconds Number of Acquired Spot Images: 0 FINDINGS: Fluoroscopic images demonstrate reduction of the left shoulder dislocation. There is no fracture. IMPRESSION: Interval reduction of the left shoulder without evidence for fracture. Electronically Signed   By: San Morelle M.D.   On: 12/01/2017 13:49    Lab Data:  CBC: Recent Labs  Lab 12/01/17 0504 12/01/17 2242 12/02/17 0530  WBC 13.3* 9.7 10.6*  NEUTROABS 11.1* 6.1  --   HGB 11.7* 11.3* 10.8*  HCT 37.0* 36.0* 34.8*  MCV 70.5* 69.8* 69.3*  PLT 280 312 517   Basic Metabolic Panel: Recent Labs  Lab 12/01/17 0504 12/02/17 0530  NA 140 136  K 4.3 4.0  CL 105 107  CO2 26 20*  GLUCOSE 129* 88  BUN 17 18  CREATININE 1.28* 1.18  CALCIUM 8.7* 8.5*   GFR: Estimated Creatinine Clearance: 69.4 mL/min (by C-G formula based on SCr of 1.18 mg/dL). Liver Function Tests: Recent Labs  Lab 12/01/17 0504  AST 28  ALT 21  ALKPHOS 89  BILITOT 0.2*  PROT 7.7  ALBUMIN 3.2*   No results for input(s): LIPASE, AMYLASE in the last 168 hours. No results for input(s): AMMONIA in the last 168 hours. Coagulation Profile: No results for input(s): INR, PROTIME in the last 168 hours. Cardiac Enzymes: Recent Labs  Lab 12/01/17 0726 12/01/17 1500 12/01/17 2013  TROPONINI <0.03 <0.03 <0.03   BNP (last 3 results) No results for input(s): PROBNP in the last 8760 hours. HbA1C: No results for input(s): HGBA1C in the last 72 hours. CBG: Recent Labs  Lab 12/02/17 0654  GLUCAP 83   Lipid Profile: No results for input(s): CHOL,  HDL, LDLCALC, TRIG, CHOLHDL, LDLDIRECT in the last 72 hours. Thyroid Function Tests: Recent Labs  12/01/17 0730  TSH 0.531   Anemia Panel: No results for input(s): VITAMINB12, FOLATE, FERRITIN, TIBC, IRON, RETICCTPCT in the last 72 hours. Urine analysis:    Component Value Date/Time   COLORURINE YELLOW 12/01/2017 0003   APPEARANCEUR HAZY (A) 12/01/2017 0003   LABSPEC 1.021 12/01/2017 0003   PHURINE 6.0 12/01/2017 0003   GLUCOSEU NEGATIVE 12/01/2017 0003   HGBUR NEGATIVE 12/01/2017 0003   BILIRUBINUR NEGATIVE 12/01/2017 0003   KETONESUR NEGATIVE 12/01/2017 0003   PROTEINUR NEGATIVE 12/01/2017 0003   UROBILINOGEN 1.0 01/26/2011 0647   NITRITE POSITIVE (A) 12/01/2017 0003   LEUKOCYTESUR SMALL (A) 12/01/2017 0003     Zyere Jiminez M.D. [facility] 12/02/2017, 9:36 AM  Pager: [phone] Between 7am to 7pm - call Pager - [phone]  After 7pm go to [email or web address] - password [email or web address]  Call night coverage person covering after 7pm

## 2017-12-03 ENCOUNTER — Encounter (HOSPITAL_COMMUNITY): Payer: Self-pay | Admitting: General Practice

## 2017-12-03 ENCOUNTER — Other Ambulatory Visit: Payer: Self-pay

## 2017-12-03 ENCOUNTER — Inpatient Hospital Stay (HOSPITAL_COMMUNITY): Payer: Medicare HMO

## 2017-12-03 DIAGNOSIS — I5032 Chronic diastolic (congestive) heart failure: Secondary | ICD-10-CM

## 2017-12-03 DIAGNOSIS — R55 Syncope and collapse: Secondary | ICD-10-CM

## 2017-12-03 LAB — ECHOCARDIOGRAM COMPLETE
E decel time: 264 ms
E/e' ratio: 5.96
FS: 32 % (ref 28–44)
IVS/LV PW RATIO, ED: 0.95
LA ID, A-P, ES: 35 mm
LA diam end sys: 35 mm
LA diam index: 1.54 cm/m2
LA vol A4C: 37.9 mL
LA vol index: 16.2 mL/m2
LA vol: 36.9 mL
LV E/e' medial: 5.96
LV E/e'average: 5.96
LV PW d: 11 mm — AB (ref 0.6–1.1)
LV e' LATERAL: 10.2 cm/s
LVOT SV: 76 mL
LVOT VTI: 18.3 cm
LVOT area: 4.15 cm2
LVOT diameter: 23 mm
LVOT peak grad rest: 3 mmHg
LVOT peak vel: 87.5 cm/s
Lateral S' vel: 13.4 cm/s
MV Dec: 264
MV pk A vel: 62.1 m/s
MV pk E vel: 60.8 m/s
TAPSE: 11.5 mm
TDI e' lateral: 10.2
TDI e' medial: 8.49
Weight: 3728.42 [oz_av]

## 2017-12-03 LAB — URINE CULTURE: CULTURE: NO GROWTH

## 2017-12-03 MED ORDER — CLOTRIMAZOLE-BETAMETHASONE 1-0.05 % EX CREA
1.0000 | TOPICAL_CREAM | Freq: Two times a day (BID) | CUTANEOUS | Status: DC | PRN
Start: 2017-12-03 — End: 2024-10-24

## 2017-12-03 MED ORDER — ACETAMINOPHEN 325 MG PO TABS: 650.0000 mg | ORAL_TABLET | Freq: Four times a day (QID) | ORAL | 0 refills | Status: AC | PRN

## 2017-12-03 MED ORDER — TRAMADOL HCL 50 MG PO TABS: 50.0000 mg | ORAL_TABLET | Freq: Three times a day (TID) | ORAL | 0 refills | Status: AC | PRN

## 2017-12-03 MED ORDER — CEPHALEXIN 500 MG PO CAPS: 500.0000 mg | ORAL_CAPSULE | Freq: Two times a day (BID) | ORAL | 0 refills | Status: DC

## 2017-12-03 MED ORDER — HYDROXYZINE HCL 10 MG PO TABS: 10.0000 mg | ORAL_TABLET | Freq: Three times a day (TID) | ORAL | 0 refills | Status: DC | PRN

## 2017-12-03 MED ORDER — UNABLE TO FIND: 0 refills | Status: DC

## 2017-12-03 MED ORDER — AMIODARONE HCL 200 MG PO TABS: 200.0000 mg | ORAL_TABLET | Freq: Two times a day (BID) | ORAL | 2 refills | Status: DC

## 2017-12-03 MED ORDER — DIGOXIN 125 MCG PO TABS: 0.1250 mg | ORAL_TABLET | Freq: Every day | ORAL | 3 refills | Status: AC

## 2017-12-03 NOTE — Plan of Care (Signed)
  Progressing Education: Knowledge of General Education information will improve 12/03/2017 1406 - Progressing by Rance Muir, RN Health Behavior/Discharge Planning: Ability to manage health-related needs will improve 12/03/2017 1406 - Progressing by Rance Muir, RN Clinical Measurements: Ability to maintain clinical measurements within normal limits will improve 12/03/2017 1406 - Progressing by Rance Muir, RN Will remain free from infection 12/03/2017 1406 - Progressing by Rance Muir, RN Diagnostic test results will improve 12/03/2017 1406 - Progressing by Rance Muir, RN Respiratory complications will improve 12/03/2017 1406 - Progressing by Rance Muir, RN Cardiovascular complication will be avoided 12/03/2017 1406 - Progressing by Rance Muir, RN Activity: Risk for activity intolerance will decrease 12/03/2017 1406 - Progressing by Rance Muir, RN Nutrition: Adequate nutrition will be maintained 12/03/2017 1406 - Progressing by Rance Muir, RN Coping: Level of anxiety will decrease 12/03/2017 1406 - Progressing by Rance Muir, RN Elimination: Will not experience complications related to bowel motility 12/03/2017 1406 - Progressing by Rance Muir, RN Will not experience complications related to urinary retention 12/03/2017 1406 - Progressing by Rance Muir, RN Pain Managment: General experience of comfort will improve 12/03/2017 1406 - Progressing by Rance Muir, RN Safety: Ability to remain free from injury will improve 12/03/2017 1406 - Progressing by Rance Muir, RN Skin Integrity: Risk for impaired skin integrity will decrease 12/03/2017 1406 - Progressing by Rance Muir, RN

## 2017-12-03 NOTE — Plan of Care (Signed)
  Adequate for Discharge Education: Knowledge of General Education information will improve 12/03/2017 1501 - Adequate for Discharge by Rance Muir, RN 12/03/2017 1406 - Progressing by Rance Muir, RN Health Behavior/Discharge Planning: Ability to manage health-related needs will improve 12/03/2017 1501 - Adequate for Discharge by Rance Muir, RN 12/03/2017 1406 - Progressing by Rance Muir, RN Clinical Measurements: Ability to maintain clinical measurements within normal limits will improve 12/03/2017 1501 - Adequate for Discharge by Rance Muir, RN 12/03/2017 1406 - Progressing by Rance Muir, RN Will remain free from infection 12/03/2017 1501 - Adequate for Discharge by Rance Muir, RN 12/03/2017 1406 - Progressing by Rance Muir, RN Diagnostic test results will improve 12/03/2017 1501 - Adequate for Discharge by Rance Muir, RN 12/03/2017 1406 - Progressing by Rance Muir, RN Respiratory complications will improve 12/03/2017 1501 - Adequate for Discharge by Rance Muir, RN 12/03/2017 1406 - Progressing by Rance Muir, RN Cardiovascular complication will be avoided 12/03/2017 1501 - Adequate for Discharge by Rance Muir, RN 12/03/2017 1406 - Progressing by Rance Muir, RN Activity: Risk for activity intolerance will decrease 12/03/2017 1501 - Adequate for Discharge by Rance Muir, RN 12/03/2017 1406 - Progressing by Rance Muir, RN Nutrition: Adequate nutrition will be maintained 12/03/2017 1501 - Adequate for Discharge by Rance Muir, RN 12/03/2017 1406 - Progressing by Rance Muir, RN Coping: Level of anxiety will decrease 12/03/2017 1501 - Adequate for Discharge by Rance Muir, RN 12/03/2017 1406 - Progressing by Rance Muir, RN Elimination: Will not experience complications related to bowel motility 12/03/2017 1501 - Adequate for Discharge by Rance Muir, RN 12/03/2017 1406 - Progressing by Rance Muir, RN Will not experience complications related to urinary retention 12/03/2017 1501 - Adequate for Discharge by  Rance Muir, RN 12/03/2017 1406 - Progressing by Rance Muir, RN Pain Managment: General experience of comfort will improve 12/03/2017 1501 - Adequate for Discharge by Rance Muir, RN 12/03/2017 1406 - Progressing by Rance Muir, RN Safety: Ability to remain free from injury will improve 12/03/2017 1501 - Adequate for Discharge by Rance Muir, RN 12/03/2017 1406 - Progressing by Rance Muir, RN Skin Integrity: Risk for impaired skin integrity will decrease 12/03/2017 1501 - Adequate for Discharge by Rance Muir, RN 12/03/2017 1406 - Progressing by Rance Muir, RN

## 2017-12-03 NOTE — Progress Notes (Signed)
Bedside EEG completed, results pending. 

## 2017-12-03 NOTE — Procedures (Signed)
History: 66 yo M with syncope  Sedation: None  Technique: This is a 21 channel routine scalp EEG performed at the bedside with bipolar and monopolar montages arranged in accordance to the international 10/20 system of electrode placement. One channel was dedicated to EKG recording.    Background: The background consists of intermixed alpha and beta activities. There is a well defined posterior dominant rhythm of 9 Hz that attenuates with eye opening. There is anterior shifting of the PDR with drowsiness. Sleep spindles are recorded and are symmetric.   Photic stimulation: Physiologic driving is not perfomred  EEG Abnormalities: None  Clinical Interpretation: This normal EEG is recorded in the waking and sleep state. There was no seizure or seizure predisposition recorded on this study. Please note that a normal EEG does not preclude the possibility of epilepsy.   Roland Rack, MD [facility] [phone]  If 7pm- 7am, please page neurology on call as listed in [place].

## 2017-12-03 NOTE — Progress Notes (Signed)
Physical Therapy Evaluation Patient Details Name: [name] [name] MRN: [id number] DOB: 04/25/1951 Today's Date: 12/03/2017   History of Present Illness  [name] [name] is a 66 y/o male admitted on 12/01/17 s.p syncope with fall at home. Patient with shoulder dislocation due to fall and currently in sling for comfort. Patient with a PMHx significant for paroxysmal A. fib, a flutter, diastolic congestive heart failure, CAD, sick sinus syndrome, COPD not on home oxygen, GERD, BPH, hypogonadism.   Clinical Impression  Patient up and walking in room upon PT arrival. Patient reports living alone with ability to perform functional mobility, ADLs and household tasks with independence, prior to admission. PT able to observe patient ambulate, transfer bed to recliner, as well as transfer to toilet with little to no issue, other than not being able to use L UE to assist with transfers. PT adjusting sling for appropriate usage. No further PT needs identified at this time. PT to sign off.     Follow Up Recommendations No PT follow up    Equipment Recommendations  None recommended by PT    Recommendations for Other Services       Precautions / Restrictions Precautions Precautions: Fall Restrictions Weight Bearing Restrictions: Yes Other Position/Activity Restrictions: no pulling up with L UE, no lifting >5# with L UE, sling for comfort      Mobility  Bed Mobility Overal bed mobility: Modified Independent             General bed mobility comments: increased time required  Transfers Overall transfer level: Modified independent Equipment used: None                Ambulation/Gait Ambulation/Gait assistance: Supervision Ambulation Distance (Feet): 50 Feet Assistive device: None       General Gait Details: patient able to ambulate with in room and bathroom with no LOB with no AD  Stairs            Wheelchair Mobility    Modified Rankin (Stroke Patients Only)        Balance Overall balance assessment: Modified Independent                                           Pertinent Vitals/Pain Pain Assessment: No/denies pain    Home Living Family/patient expects to be discharged to:: Private residence Living Arrangements: Alone   Type of Home: Apartment Home Access: Level entry     Home Layout: One level Home Equipment: None      Prior Function Level of Independence: Independent         Comments: patient reports he lives alone in a single story apartment. Patient reports independence with ADLs, household tasks and driving prior to fall     Hand Dominance        Extremity/Trunk Assessment   Upper Extremity Assessment Upper Extremity Assessment: Defer to OT evaluation    Lower Extremity Assessment Lower Extremity Assessment: Overall WFL for tasks assessed       Communication   Communication: No difficulties  Cognition Arousal/Alertness: Awake/alert Behavior During Therapy: WFL for tasks assessed/performed Overall Cognitive Status: Within Functional Limits for tasks assessed                                        General Comments  Exercises     Assessment/Plan    PT Assessment Patent does not need any further PT services  PT Problem List         PT Treatment Interventions      PT Goals (Current goals can be found in the Care Plan section)  Acute Rehab PT Goals Patient Stated Goal: return home PT Goal Formulation: With patient Time For Goal Achievement: 12/07/17 Potential to Achieve Goals: Good    Frequency     Barriers to discharge        Co-evaluation               AM-PAC PT "6 Clicks" Daily Activity  Outcome Measure Difficulty turning over in bed (including adjusting bedclothes, sheets and blankets)?: None Difficulty moving from lying on back to sitting on the side of the bed? : None Difficulty sitting down on and standing up from a chair with arms (e.g.,  wheelchair, bedside commode, etc,.)?: A Little Help needed moving to and from a bed to chair (including a wheelchair)?: None Help needed walking in hospital room?: None Help needed climbing 3-5 steps with a railing? : A Little 6 Click Score: 22    End of Session   Activity Tolerance: Patient tolerated treatment well Patient left: in bed;with call bell/phone within reach;with nursing/sitter in room Nurse Communication: Mobility status PT Visit Diagnosis: History of falling (Z91.81)    Time: 5498-2641 PT Time Calculation (min) (ACUTE ONLY): 17 min   Charges:   PT Evaluation $PT Eval Low Complexity: 1 Low     Lanney Gins, PT, DPT 12/03/17 9:45 AM

## 2017-12-03 NOTE — Evaluation (Signed)
Occupational Therapy Evaluation Patient Details Name: [name] [name] MRN: [id number] DOB: 09-13-1951 Today's Date: 12/03/2017    History of Present Illness [name] [name] is a 66 y/o male admitted on 12/01/17 s.p syncope with fall at home. Patient with shoulder dislocation due to fall and currently in sling for comfort. Patient with a PMHx significant for paroxysmal A. fib, a flutter, diastolic congestive heart failure, CAD, sick sinus syndrome, COPD not on home oxygen, GERD, BPH, hypogonadism.    Clinical Impression   PTA, pt was independent with ADL and functional mobility, lives alone and was working. Pt currently requires min assist for UB ADL and supervision for functional mobility. Educated pt concerning compensatory strategies for UB dressing tasks as well as HEP for gentle L shoulder pendulums and L elbow/wrist/hand AROM. Pt requires supervision overall for safety with pendulums at this time. He is able to complete toileting tasks with overall supervision as well and VC's to avoid L shoulder rotation during clothing management. Pt would benefit from continued OT services while admitted to improve independence and safety with ADL and functional mobility prior to returning home. Feel pt would benefit from outpatient OT services post-acute D/C to maximize functional use of L UE once cleared by MD.     Follow Up Recommendations  Outpatient OT;Supervision - Intermittent(Outpatient therapies once cleared by MD)    Equipment Recommendations  None recommended by OT    Recommendations for Other Services       Precautions / Restrictions Precautions Precautions: Fall Precaution Comments: See weight bearing section concerning precautions for L shoulder Restrictions Weight Bearing Restrictions: Yes LUE Weight Bearing: Weight bearing as tolerated(see below) Other Position/Activity Restrictions: No pulling up with L UE, no lifting >5# with L UE, OK for gentle pendulums (L UE), unrestricted L  elbow/wrist/hand AROM, avoid L shoulder rotation especially external rotation, and OK for gentle forward flexion/extension of L shoulder.       Mobility Bed Mobility Overal bed mobility: Modified Independent             General bed mobility comments: increased time required  Transfers Overall transfer level: Needs assistance Equipment used: None Transfers: Sit to/from Stand Sit to Stand: Supervision         General transfer comment: for safety    Balance Overall balance assessment: No apparent balance deficits (not formally assessed)                                         ADL either performed or assessed with clinical judgement   ADL Overall ADL's : Needs assistance/impaired Eating/Feeding: Set up;Sitting   Grooming: Set up;Standing   Upper Body Bathing: Minimal assistance;Sitting;Cueing for UE precautions   Lower Body Bathing: Supervison/ safety;Sit to/from stand   Upper Body Dressing : Minimal assistance;Sitting;Cueing for UE precautions   Lower Body Dressing: Supervision/safety;Sit to/from stand Lower Body Dressing Details (indicate cue type and reason): Cueing to avoid internal rotation of L shoulder Toilet Transfer: Supervision/safety;Ambulation Toilet Transfer Details (indicate cue type and reason): At times pushing IV pole Toileting- Clothing Manipulation and Hygiene: Supervision/safety;Sit to/from stand       Functional mobility during ADLs: Supervision/safety General ADL Comments: Pt able to complete functional mobility with supervision. Educated pt concerning compensatory strategies for UB dressing tasks as well as home set-up for safety and to avoid overuse of L shoulder per parameters given by MD. Additionally educated on sling wear  schedule and no driving with sling on.      Vision Patient Visual Report: No change from baseline Vision Assessment?: No apparent visual deficits     Perception     Praxis      Pertinent  Vitals/Pain Pain Assessment: 0-10 Pain Score: 3  Pain Location: L shoulder Pain Descriptors / Indicators: Guarding;Sore Pain Intervention(s): Limited activity within patient's tolerance;Monitored during session;Repositioned     Hand Dominance Right   Extremity/Trunk Assessment Upper Extremity Assessment Upper Extremity Assessment: LUE deficits/detail LUE Deficits / Details: Sensation in tact. Full AROM elbow/wrist/hand. Able to achieve only 0-15 degrees forward flexion of L shoulder.    Lower Extremity Assessment Lower Extremity Assessment: Overall WFL for tasks assessed       Communication Communication Communication: No difficulties   Cognition Arousal/Alertness: Awake/alert Behavior During Therapy: WFL for tasks assessed/performed Overall Cognitive Status: Within Functional Limits for tasks assessed                                     General Comments       Exercises Exercises: Shoulder Shoulder Exercises Pendulum Exercise: Standing(with supervision and handout provided) Elbow Flexion: AROM;10 reps;Left;Standing Elbow Extension: AROM;Left;10 reps;Standing Wrist Flexion: AROM;Left;10 reps;Standing Wrist Extension: AROM;Left;10 reps;Standing Digit Composite Flexion: AROM;Left;10 reps;Standing Composite Extension: AROM;Left;10 reps;Standing   Shoulder Instructions Shoulder Instructions Donning/doffing shirt without moving shoulder: Minimal assistance Method for sponge bathing under operated UE: Minimal assistance Donning/doffing sling/immobilizer: Minimal assistance Correct positioning of sling/immobilizer: Supervision/safety Pendulum exercises (written home exercise program): Min-guard ROM for elbow, wrist and digits of operated UE: Supervision/safety Sling wearing schedule (on at all times/off for ADL's): Supervision/safety(sling for comfort per MD)    Home Living Family/patient expects to be discharged to:: Private residence Living Arrangements:  Alone Available Help at Discharge: Friend(s);Available PRN/intermittently Type of Home: Apartment Home Access: Level entry     Home Layout: One level     Bathroom Shower/Tub: Teacher, early years/pre: Standard     Home Equipment: None   Additional Comments: bathroom information from previous admission      Prior Functioning/Environment Level of Independence: Independent        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 Patient reports he lives alone in a single story apartment. Patient reports independence with ADLs, household tasks and driving prior to fall        OT Problem List: Decreased strength;Decreased range of motion;Decreased activity tolerance;Decreased knowledge of precautions;Pain;Impaired UE functional use      OT Treatment/Interventions: Self-care/ADL training;Therapeutic exercise;Energy conservation;DME and/or AE instruction;Therapeutic activities;Cognitive remediation/compensation;Patient/family education;Balance training    OT Goals(Current goals can be found in the care plan section) Acute Rehab OT Goals Patient Stated Goal: return home OT Goal Formulation: With patient Time For Goal Achievement: 12/17/17 Potential to Achieve Goals: Good  OT Frequency: Min 2X/week   Barriers to D/C: Decreased caregiver support          Co-evaluation              AM-PAC PT "6 Clicks" Daily Activity     Outcome Measure Help from another person eating meals?: A Little Help from another person taking care of personal grooming?: A Little Help from another person toileting, which includes using toliet, bedpan, or urinal?: A Little Help from another person bathing (including washing, rinsing, drying)?: A Little Help from another person to put on and taking off regular upper body clothing?: A Little Help from another  person to put on and taking off regular lower body clothing?: A Little 6 Click Score: 18   End of Session Equipment Utilized During Treatment: (L  shoulder sling/immobilizer) Nurse Communication: Mobility status  Activity Tolerance: Patient tolerated treatment well Patient left: in chair;with call bell/phone within reach  OT Visit Diagnosis: Pain Pain - Right/Left: Left Pain - part of body: Shoulder                Time: 6568-1275 OT Time Calculation (min): 29 min Charges:  OT General Charges $OT Visit: 1 Visit OT Evaluation $OT Eval Moderate Complexity: 1 Mod OT Treatments $Therapeutic Activity: 8-22 mins G-Codes:     Norman Herrlich, MS OTR/L  Pager: [name] [name] 12/03/2017, 11:44 AM

## 2017-12-03 NOTE — Discharge Summary (Signed)
Physician Discharge Summary   Patient ID: [name] [name] MRN: [id number] DOB/AGE: 09-03-1951 66 y.o.  Admit date: 11/30/2017 Discharge date: 12/03/2017  Primary Care Physician:  Arnoldo Morale, MD  Discharge Diagnoses:    . Syncope . Sick sinus syndrome ([place]) . IRON DEFIC ANEMIA SEC DIET IRON INTAKE . Gynecomastia . Essential hypertension, benign . Atrial fibrillation ([place]) . Dislocated shoulder left s/p closed reduction  . UTI (urinary tract infection)  Mild acute renal insufficiency    Consults:  orthopedics   Recommendations for Outpatient Follow-up:  1. Left shoulder Sling for 2 weeks, PT OT outpatient and follow-up outpatient with ortho, Dr Marcelino Scot   2. Please repeat CBC/BMET at next visit    DIET: heart healthy     Allergies:  No Known Allergies   DISCHARGE MEDICATIONS: Allergies as of 12/03/2017   No Known Allergies     Medication List    STOP taking these medications   losartan 100 MG tablet Commonly known as:  COZAAR     TAKE these medications   acetaminophen 325 MG tablet Commonly known as:  TYLENOL Take 2 tablets (650 mg total) by mouth every 6 (six) hours as needed for mild pain (or Fever >/= 101).   albuterol (2.5 MG/3ML) 0.083% nebulizer solution Commonly known as:  PROVENTIL Take 3 mLs (2.5 mg total) by nebulization every 6 (six) hours as needed for wheezing or shortness of breath.   albuterol 108 (90 Base) MCG/ACT inhaler Commonly known as:  PROVENTIL HFA;VENTOLIN HFA Inhale 2 puffs into the lungs every 6 (six) hours as needed.   amiodarone 200 MG tablet Commonly known as:  PACERONE Take 1 tablet (200 mg total) by mouth 2 (two) times daily.   aspirin EC 81 MG tablet Take 81 mg by mouth daily.   atorvastatin 40 MG tablet Commonly known as:  LIPITOR Take 1 tablet (40 mg total) by mouth daily at 6 PM.   cephALEXin 500 MG capsule Commonly known as:  KEFLEX Take 1 capsule (500 mg total) by mouth 2 (two) times daily. X 5 days    clomiPHENE 50 MG tablet Commonly known as:  CLOMID Take 50 mg by mouth daily. 1/4 tab daily   clotrimazole-betamethasone cream Commonly known as:  LOTRISONE Apply 1 application topically 2 (two) times daily as needed (FOR RASH).   digoxin 0.125 MG tablet Commonly known as:  LANOXIN Take 1 tablet (0.125 mg total) by mouth daily.   ergocalciferol 50000 units capsule Commonly known as:  DRISDOL Take 1 capsule (50,000 Units total) by mouth once a week.   ferrous sulfate 325 (65 FE) MG tablet Take 325 mg by mouth daily with breakfast. Reported on 03/16/2016   Fluticasone Furoate 100 MCG/ACT Aepb Commonly known as:  ARNUITY ELLIPTA Inhale 1 puff into the lungs daily.   Fluticasone-Salmeterol 250-50 MCG/DOSE Aepb Commonly known as:  ADVAIR Inhale 1 puff into the lungs 2 (two) times daily.   furosemide 20 MG tablet Commonly known as:  LASIX TAKE 1 TABLET (20 MG TOTAL) BY MOUTH DAILY.   hydrOXYzine 10 MG tablet Commonly known as:  ATARAX/VISTARIL Take 1 tablet (10 mg total) by mouth every 8 (eight) hours as needed for itching.   metoprolol succinate 25 MG 24 hr tablet Commonly known as:  TOPROL-XL Take 1 tablet (25 mg total) by mouth daily.   nitroGLYCERIN 0.4 MG SL tablet Commonly known as:  NITROSTAT Place 1 tablet (0.4 mg total) under the tongue every 5 (five) minutes as needed for chest pain.  pantoprazole 40 MG tablet Commonly known as:  PROTONIX Take 1 tablet (40 mg total) by mouth daily.   potassium chloride 10 MEQ tablet Commonly known as:  K-DUR Take 10 mEq by mouth daily.   sildenafil 20 MG tablet Commonly known as:  REVATIO Take 20 mg by mouth as needed (for ED).   tamoxifen 10 MG tablet Commonly known as:  NOLVADEX Take 10 mg by mouth daily.   tamsulosin 0.4 MG Caps capsule Commonly known as:  FLOMAX Take 1 capsule (0.4 mg total) by mouth daily.   traMADol 50 MG tablet Commonly known as:  ULTRAM Take 1 tablet (50 mg total) by mouth every 8 (eight)  hours as needed for severe pain.   UNABLE TO FIND Outpatient PT, OT  Diagnosis: Shoulder dislocation, syncope        Brief H and P: For complete details please refer to admission H and P, but in brief Patient is a 66 year old male with paroxysmal A. fib, diastolic CHF, CAD, sick sinus syndrome, COPD not on home O2, GERD, BPH presented with syncope at home, resulting in fall and dislocated left shoulder.  UA also showed UTI.   Hospital Course:   Syncope -In the setting of UTI, has a history of focal seizures, atrial fibrillation, sick sinus syndrome -Troponins x3 neg, no acute arrhythmias -Patient was placed on gentle IV fluid hydration, IV antibiotics for UTI -D-dimer was slightly elevated, CT angiogram of the chest was negative for PE.  - EEG was negative for any seizures  - 2D echo was done showed EF 55-60%, normal wall motion   Left dislocated shoulder -Status post closed reduction in OR, appreciate orthopedic recommendations, Dr. Marcelino Scot -Continue sling, out patient PT, OT. Instructions were given to the patient per orthopedics recommendations.  - Outpatient follow-up with Dr Marcelino Scot    UTI (urinary tract infection) - UA was grossly positive for UTI, Urine culture showed multiple species. Patient was placed on IV antibiotics and then transitioned to oral keflex for 5 days   History of seizures -Patient has a history of focal seizures, no grand mal/generalized seizures.  EEG in 6/18 was normal, neuro note indicated concern for partial seizure activity.Patient was started on Keppra but he reported that he had stopped it - EEG negative for any focal seizure   COPD, not on any O2 at home -Currently stable, no wheezing, continue home inhalers  Hypertension -Continue pain control and metoprolol, BP now stable  History of diastolic dysfunction -2D echo in 12/16 showed EF of 50-55% with grade 1 diastolic dysfunction - stable, compensated. Resume lasix upon discharge.  -  echo showed EF 55-60%, normal wall motion   Mild acute renal insufficiency  Cr 1.28 on admission improved to 1.1 at discharge. - Hold losartan.   Atrial fibrillation, chronic -Continue amiodarone, metoprolol -Not on any anticoagulation due to risk of falls  Anemia -Microcytic, chronic, baseline 10-11, close to baseline -No obvious GI bleeding, outpatient follow-up with PCP    Day of Discharge BP 121/69   Pulse 60   Temp 98 F (36.7 C) (Oral)   Resp (!) 24   Wt 105.7 kg (233 lb 0.4 oz)   SpO2 97%   BMI 36.50 kg/m   Physical Exam: General: Alert and awake oriented x3 not in any acute distress. HEENT: anicteric sclera, pupils reactive to light and accommodation CVS: S1-S2 clear no murmur rubs or gallops Chest: clear to auscultation bilaterally, no wheezing rales or rhonchi Abdomen: soft nontender, nondistended, normal bowel sounds  Extremities: no cyanosis, clubbing or edema noted bilaterally. Left shoulder in sling  Neuro: Cranial nerves II-XII intact, no focal neurological deficits   The results of significant diagnostics from this hospitalization (including imaging, microbiology, ancillary and laboratory) are listed below for reference.    LAB RESULTS: Basic Metabolic Panel: Recent Labs  Lab 12/01/17 0504 12/02/17 0530  NA 140 136  K 4.3 4.0  CL 105 107  CO2 26 20*  GLUCOSE 129* 88  BUN 17 18  CREATININE 1.28* 1.18  CALCIUM 8.7* 8.5*   Liver Function Tests: Recent Labs  Lab 12/01/17 0504  AST 28  ALT 21  ALKPHOS 89  BILITOT 0.2*  PROT 7.7  ALBUMIN 3.2*   No results for input(s): LIPASE, AMYLASE in the last 168 hours. No results for input(s): AMMONIA in the last 168 hours. CBC: Recent Labs  Lab 12/01/17 2242 12/02/17 0530  WBC 9.7 10.6*  NEUTROABS 6.1  --   HGB 11.3* 10.8*  HCT 36.0* 34.8*  MCV 69.8* 69.3*  PLT 312 282   Cardiac Enzymes: Recent Labs  Lab 12/01/17 1500 12/01/17 2013  TROPONINI <0.03 <0.03   BNP: Invalid input(s):  POCBNP CBG: Recent Labs  Lab 12/02/17 0654  GLUCAP 83    Significant Diagnostic Studies:  Dg Chest Port 1 View  Result Date: 12/01/2017 CLINICAL DATA:  Anterior shoulder dislocation EXAM: PORTABLE CHEST 1 VIEW COMPARISON:  Radiograph 01/24/2016 FINDINGS: Anterior LEFT shoulder dislocation. Normal cardiac silhouette.  No pneumothorax.  No rib fracture. IMPRESSION: LEFT anterior shoulder dislocation. Electronically Signed   By: Suzy Bouchard M.D.   On: 12/01/2017 09:55   Dg Shoulder Left  Result Date: 12/01/2017 CLINICAL DATA:  66 year old may male status post reduction of left shoulder dislocation. EXAM: LEFT SHOULDER - 2+ VIEW COMPARISON:  Radiographs from earlier the same day. FINDINGS: There has been interval reduction of the previously noted anterior shoulder dislocation. Osseous irregularity along the inferior glenoid may represent a subtle Bankart lesion. No other definitive fractures noted. IMPRESSION: Interval reduction of previously noted anterior shoulder dislocation. Osseous irregularity at the inferior glenoid may represent a subtle bony Bankart lesion. Electronically Signed   By: Kristopher Oppenheim M.D.   On: 12/01/2017 14:04   Dg Shoulder Left  Result Date: 12/01/2017 CLINICAL DATA:  Close reduction of left shoulder dislocation. EXAM: DG C-ARM 61-120 MIN; LEFT SHOULDER - 2+ VIEW COMPARISON:  Left shoulder radiographs of the same day. FLUOROSCOPY TIME:  Fluoroscopy Time:  8 seconds Number of Acquired Spot Images: 0 FINDINGS: Fluoroscopic images demonstrate reduction of the left shoulder dislocation. There is no fracture. IMPRESSION: Interval reduction of the left shoulder without evidence for fracture. Electronically Signed   By: San Morelle M.D.   On: 12/01/2017 13:49   Dg Shoulder Left Portable  Result Date: 12/01/2017 CLINICAL DATA:  Pain after fall EXAM: LEFT SHOULDER - 1 VIEW COMPARISON:  None. FINDINGS: The study is limited as only a single anterior view was obtained.  There is an anterior dislocation identified. No fractures are seen. IMPRESSION: Limited study due to a single view.  Anterior dislocation. Electronically Signed   By: Dorise Bullion III M.D   On: 12/01/2017 00:55   Dg C-arm 1-60 Min  Result Date: 12/01/2017 CLINICAL DATA:  Close reduction of left shoulder dislocation. EXAM: DG C-ARM 61-120 MIN; LEFT SHOULDER - 2+ VIEW COMPARISON:  Left shoulder radiographs of the same day. FLUOROSCOPY TIME:  Fluoroscopy Time:  8 seconds Number of Acquired Spot Images: 0 FINDINGS: Fluoroscopic images demonstrate reduction  of the left shoulder dislocation. There is no fracture. IMPRESSION: Interval reduction of the left shoulder without evidence for fracture. Electronically Signed   By: San Morelle M.D.   On: 12/01/2017 13:49    2D ECHO:   Disposition and Follow-up: Discharge Instructions    Discharge instructions   Complete by:  As directed    Left shoulder instructions:            Sling for comfort             Ice as needed          Physical therapy:   Okay to perform gentle shoulder pendulums as well as gentle shoulder flexion and extension.  Limit rotation at this time particularly external rotation.Unrestricted range of motion of the elbow, forearm, wrist and hand             No lifting greater than 5 pounds with left arm             Follow-up Dr Marcelino Scot in office in 7-10 days to arrange outpt PT/OT       DISPOSITION:  Home    DISCHARGE FOLLOW-UP Follow-up Information    Altamese Goshen, MD. Schedule an appointment as soon as possible for a visit in 10 day(s).   Specialty:  Orthopedic Surgery Contact information: [place] [address] [phone]        Arnoldo Morale, MD. Schedule an appointment as soon as possible for a visit in 2 week(s).   Specialty:  Family Medicine Contact information: [place] [address] [phone]            Time spent on Discharge: 73mins    Signed:   Estill Cotta M.D. [facility] 12/03/2017, 3:46 PM Pager: [phone]

## 2017-12-05 ENCOUNTER — Ambulatory Visit: Payer: Self-pay

## 2017-12-05 ENCOUNTER — Other Ambulatory Visit: Payer: Self-pay

## 2017-12-05 NOTE — Patient Outreach (Signed)
[facility]) Care Management  12/05/2017  [name] [name] 02-12-1951 [id number]     Transition of Care Referral  Referral Date: 12/05/17 Referral Source: [employer] Discharge Report Date of Admission: 12/01/17 Diagnosis: syncope, collapse, fall, s/p dislocated shoulder repair" Date of Discharge: 12/03/17 Facility: [facility]    Outreach attempt # 1 to patient. Patient currently in vehicle and having some driving difficulties due to snow. Advised that it is unsafe to talk on the phone during this time and RN CM will call back at another time.     Plan: RN CM will make outreach attempt to patient within one business day.   Enzo Montgomery, RN,BSN,CCM [facility] Telephonic Care Management Coordinator Direct Phone: [phone] Toll Free: [phone] Fax: [phone]

## 2017-12-06 ENCOUNTER — Other Ambulatory Visit: Payer: Self-pay

## 2017-12-06 NOTE — Patient Outreach (Signed)
[name]) Care Management  12/06/2017  [name] [name] [birth year] [id number]   Transition of Care Referral  Referral Date: 12/05/17 Referral Source: [employer] Discharge Report Date of Admission: 12/01/17 Diagnosis: syncope, collapse, fall, s/p dislocated shoulder repair" Date of Discharge: 12/03/17 Facility: [facility]    Outreach attempt # 2 to patient. No answer at present and unable to leave voicemail message.     Plan: RN CM will make outreach attempt to patient within one business day.   Enzo Montgomery, RN,BSN,CCM [facility] [facility] Telephonic Care Management Coordinator Direct Phone: [phone] Toll Free: [phone] Fax: [phone]

## 2017-12-07 ENCOUNTER — Other Ambulatory Visit: Payer: Self-pay

## 2017-12-07 NOTE — Patient Outreach (Signed)
[place]) Care Management  12/07/2017  [name] [name] 09/01/1951 [id number]   Transition of Care Referral  Referral Date:12/05/17 Referral [facility] Discharge Report Date of Admission:12/01/17 Diagnosis:syncope, collapse, fall, s/p dislocated shoulder repair" Date of Discharge:12/03/17 [facility]    Outreach attempt #3 to patient. No answer at present and unable to leave message.     Plan: RN CM will send unsuccessful outreach letter to patient and close case if no response within 10 business days.    Enzo Montgomery, RN,BSN,CCM [facility] Telephonic Care Management Coordinator Direct Phone: [phone] Toll Free: [phone] Fax: [phone]

## 2017-12-21 ENCOUNTER — Other Ambulatory Visit: Payer: Self-pay

## 2017-12-21 NOTE — Patient Outreach (Signed)
[facility]) Care Management  12/21/2017  [name] [name] 25-Mar-1951 [id number]   Transition of Care Referral  Referral Date:12/05/17 Referral [place] Discharge Report Date of Admission:12/01/17 Diagnosis:syncope, collapse, fall, s/p dislocated shoulder repair" Date of Discharge:12/03/17 [facility]   Multiple attempts to establish contact with patient without success. No response from letter mailed to patient. Case is being closed at this time.     Plan: RN CM will notify [facility] administrative assistant of case status.   Enzo Montgomery, RN,BSN,CCM [facility] Telephonic Care Management Coordinator Direct Phone: [phone] Toll Free: [phone] Fax: [phone]

## 2018-02-03 ENCOUNTER — Other Ambulatory Visit: Payer: Self-pay | Admitting: Family Medicine

## 2018-02-03 DIAGNOSIS — Z8709 Personal history of other diseases of the respiratory system: Secondary | ICD-10-CM

## 2018-02-04 DIAGNOSIS — R002 Palpitations: Secondary | ICD-10-CM | POA: Diagnosis not present

## 2018-02-04 DIAGNOSIS — R55 Syncope and collapse: Secondary | ICD-10-CM | POA: Diagnosis not present

## 2018-02-04 DIAGNOSIS — I2510 Atherosclerotic heart disease of native coronary artery without angina pectoris: Secondary | ICD-10-CM | POA: Diagnosis not present

## 2018-02-04 DIAGNOSIS — Z6834 Body mass index (BMI) 34.0-34.9, adult: Secondary | ICD-10-CM | POA: Diagnosis not present

## 2018-02-04 DIAGNOSIS — E6609 Other obesity due to excess calories: Secondary | ICD-10-CM | POA: Diagnosis not present

## 2018-02-04 DIAGNOSIS — E785 Hyperlipidemia, unspecified: Secondary | ICD-10-CM | POA: Diagnosis not present

## 2018-02-04 DIAGNOSIS — I10 Essential (primary) hypertension: Secondary | ICD-10-CM | POA: Diagnosis not present

## 2018-02-04 DIAGNOSIS — J45909 Unspecified asthma, uncomplicated: Secondary | ICD-10-CM | POA: Diagnosis not present

## 2018-02-04 DIAGNOSIS — K219 Gastro-esophageal reflux disease without esophagitis: Secondary | ICD-10-CM | POA: Diagnosis not present

## 2018-02-09 ENCOUNTER — Other Ambulatory Visit: Payer: Self-pay | Admitting: Family Medicine

## 2018-02-19 ENCOUNTER — Ambulatory Visit: Payer: Medicare HMO | Attending: Family Medicine | Admitting: Family Medicine

## 2018-02-19 ENCOUNTER — Encounter: Payer: Self-pay | Admitting: Family Medicine

## 2018-02-19 VITALS — BP 145/84 | HR 57 | Temp 97.7°F | Ht 67.0 in | Wt 243.6 lb

## 2018-02-19 DIAGNOSIS — I495 Sick sinus syndrome: Secondary | ICD-10-CM | POA: Insufficient documentation

## 2018-02-19 DIAGNOSIS — I110 Hypertensive heart disease with heart failure: Secondary | ICD-10-CM | POA: Insufficient documentation

## 2018-02-19 DIAGNOSIS — I480 Paroxysmal atrial fibrillation: Secondary | ICD-10-CM | POA: Diagnosis not present

## 2018-02-19 DIAGNOSIS — I4892 Unspecified atrial flutter: Secondary | ICD-10-CM | POA: Insufficient documentation

## 2018-02-19 DIAGNOSIS — I5042 Chronic combined systolic (congestive) and diastolic (congestive) heart failure: Secondary | ICD-10-CM | POA: Insufficient documentation

## 2018-02-19 DIAGNOSIS — E291 Testicular hypofunction: Secondary | ICD-10-CM

## 2018-02-19 DIAGNOSIS — K219 Gastro-esophageal reflux disease without esophagitis: Secondary | ICD-10-CM | POA: Insufficient documentation

## 2018-02-19 DIAGNOSIS — N400 Enlarged prostate without lower urinary tract symptoms: Secondary | ICD-10-CM | POA: Insufficient documentation

## 2018-02-19 DIAGNOSIS — R569 Unspecified convulsions: Secondary | ICD-10-CM | POA: Diagnosis not present

## 2018-02-19 DIAGNOSIS — J449 Chronic obstructive pulmonary disease, unspecified: Secondary | ICD-10-CM | POA: Diagnosis not present

## 2018-02-19 DIAGNOSIS — I10 Essential (primary) hypertension: Secondary | ICD-10-CM

## 2018-02-19 DIAGNOSIS — I2510 Atherosclerotic heart disease of native coronary artery without angina pectoris: Secondary | ICD-10-CM | POA: Insufficient documentation

## 2018-02-19 DIAGNOSIS — I5032 Chronic diastolic (congestive) heart failure: Secondary | ICD-10-CM | POA: Diagnosis not present

## 2018-02-19 DIAGNOSIS — J438 Other emphysema: Secondary | ICD-10-CM

## 2018-02-19 NOTE — Patient Instructions (Signed)

## 2018-02-20 NOTE — Progress Notes (Signed)
Subjective:  Patient ID: [name] [name], male    DOB: 05/10/1951  Age: 67 y.o. MRN: [id number]  CC: Hypertension   HPI [name] [name] is a 67 year old male with a history of paroxysmal A. fib, atrial flutter, diastolic congestive heart failure (EF 55-60% in 11/2017), CAD (status post cardiac cath in 11/2015 - mid LAD 20% stenosis, dist LAD 40% stenosis, Ost RCA to prox RCA 30% stenosis, normal LV systolic function), sick sinus syndrome, COPD, GERD, seizures (managed by neurology), BPH, hypogonadism (managed by endocrine) here for a follow-up visit.  He had a hospitalization in 11/2017 for syncope in which he also sustained a dislocation of his left shoulder joint.  UA was positive for UTI. He was treated with IV fluids, IV antibiotics and he underwent close reduction of left shoulder dislocation in the OR.  Losartan was held due to slight creatinine elevation. CTA of the chest was negative for PE, EEG negative for seizures.  2D echo 12/03/17: Study Conclusions  - Left ventricle: The cavity size was normal. Systolic function was   normal. The estimated ejection fraction was in the range of 55%   to 60%. Wall motion was normal; there were no regional wall   motion abnormalities. Left ventricular diastolic function   parameters were normal.  He reports doing well and denies chest pains, shortness of breath or pedal edema but experiences some dyspnea on  moderate exertion and does have slight left shoulder pain with the cold weather but his range of motion is normal..  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  He has had no repeat syncopal episodes since discharge and informs me he has about 3 episodes in 2 years and these episode usually commence with quick lightheadedness which subsequently results in syncope. He was seen by his cardiologist- Dr Terrence Dupont last week. With regards to his health care maintenance-colonoscopy scheduled for next year.  Past Medical  History:  Diagnosis Date  . Anemia   . Asthma   . Atrial fibrillation ([place])   . CHF (congestive heart failure) ([place])   . COPD (chronic obstructive pulmonary disease) ([place])   . Heart disease   . Hypertension     Past Surgical History:  Procedure Laterality Date  . CARDIAC CATHETERIZATION N/A 12/08/2015   Procedure: Left Heart Cath and Coronary Angiography;  Surgeon: Charolette Forward, MD;  Location: [facility];  Service: Cardiovascular;  Laterality: N/A;  . ESOPHAGUS SURGERY    . SHOULDER CLOSED REDUCTION Left 12/01/2017   Procedure: CLOSED REDUCTION SHOULDER;  Surgeon: Altamese Hadley, MD;  Location: [facility];  Service: Orthopedics;  Laterality: Left;    No Known Allergies   Outpatient Medications Prior to Visit  Medication Sig Dispense Refill  . acetaminophen (TYLENOL) 325 MG tablet Take 2 tablets (650 mg total) by mouth every 6 (six) hours as needed for mild pain (or Fever >/= 101). 30 tablet 0  . albuterol (PROVENTIL HFA;VENTOLIN HFA) 108 (90 Base) MCG/ACT inhaler Inhale 2 puffs into the lungs every 6 (six) hours as needed. 1 Inhaler 5  . albuterol (PROVENTIL) (2.5 MG/3ML) 0.083% nebulizer solution Take 3 mLs (2.5 mg total) by nebulization every 6 (six) hours as needed for wheezing or shortness of breath. 150 mL 1  . amiodarone (PACERONE) 200 MG tablet Take 1 tablet (200 mg total) by mouth 2 (two) times daily. 60 tablet 2  . aspirin EC 81 MG tablet Take 81 mg by mouth daily.      Marland Kitchen  atorvastatin (LIPITOR) 40 MG tablet Take 1 tablet (40 mg total) by mouth daily at 6 PM. 30 tablet 5  . clotrimazole-betamethasone (LOTRISONE) cream Apply 1 application topically 2 (two) times daily as needed (FOR RASH).    Marland Kitchen digoxin (LANOXIN) 0.125 MG tablet Take 1 tablet (0.125 mg total) by mouth daily. 30 tablet 3  . ergocalciferol (DRISDOL) 50000 units capsule Take 1 capsule (50,000 Units total) by mouth once a week. 9 capsule 0  . Fluticasone Furoate (ARNUITY ELLIPTA) 100 MCG/ACT AEPB Inhale 1 puff  into the lungs daily. 30 each 3  . furosemide (LASIX) 20 MG tablet TAKE 1 TABLET (20 MG TOTAL) BY MOUTH DAILY. 30 tablet 5  . hydrOXYzine (ATARAX/VISTARIL) 10 MG tablet Take 1 tablet (10 mg total) by mouth every 8 (eight) hours as needed for itching. 30 tablet 0  . metoprolol succinate (TOPROL-XL) 25 MG 24 hr tablet Take 1 tablet (25 mg total) by mouth daily. 30 tablet 5  . nitroGLYCERIN (NITROSTAT) 0.4 MG SL tablet Place 1 tablet (0.4 mg total) under the tongue every 5 (five) minutes as needed for chest pain. 25 tablet 1  . pantoprazole (PROTONIX) 40 MG tablet Take 1 tablet (40 mg total) by mouth daily. 30 tablet 5  . potassium chloride (K-DUR) 10 MEQ tablet Take 10 mEq by mouth daily.    . sildenafil (REVATIO) 20 MG tablet Take 20 mg by mouth as needed (for ED).     Marland Kitchen tamsulosin (FLOMAX) 0.4 MG CAPS capsule Take 1 capsule (0.4 mg total) by mouth daily. 30 capsule 5  . clomiPHENE (CLOMID) 50 MG tablet Take 50 mg by mouth daily. 1/4 tab daily    . ferrous sulfate 325 (65 FE) MG tablet Take 325 mg by mouth daily with breakfast. Reported on 03/16/2016    . Fluticasone-Salmeterol (ADVAIR) 250-50 MCG/DOSE AEPB Inhale 1 puff into the lungs 2 (two) times daily.    . tamoxifen (NOLVADEX) 10 MG tablet Take 10 mg by mouth daily.    . traMADol (ULTRAM) 50 MG tablet Take 1 tablet (50 mg total) by mouth every 8 (eight) hours as needed for severe pain. (Patient not taking: Reported on 02/19/2018) 20 tablet 0  . UNABLE TO FIND Outpatient PT, OT  Diagnosis: Shoulder dislocation, syncope (Patient not taking: Reported on 02/19/2018) 1 Mutually Defined 0  . cephALEXin (KEFLEX) 500 MG capsule Take 1 capsule (500 mg total) by mouth 2 (two) times daily. X 5 days (Patient not taking: Reported on 02/19/2018) 10 capsule 0   No facility-administered medications prior to visit.     ROS Review of Systems  Constitutional: Negative for activity change and appetite change.  HENT: Negative for sinus pressure and sore throat.    Eyes: Negative for visual disturbance.  Respiratory: Negative for cough, chest tightness and shortness of breath.   Cardiovascular: Negative for chest pain and leg swelling.  Gastrointestinal: Negative for abdominal distention, abdominal pain, constipation and diarrhea.  Endocrine: Negative.   Genitourinary: Negative for dysuria.  Musculoskeletal: Negative for joint swelling and myalgias.  Skin: Negative for rash.  Allergic/Immunologic: Negative.   Neurological: Negative for weakness, light-headedness and numbness.  Psychiatric/Behavioral: Negative for dysphoric mood and suicidal ideas.    Objective:  BP (!) 145/84   Pulse (!) 57   Temp 97.7 F (36.5 C) (Oral)   Ht 5\' 7"  (1.702 m)   Wt 243 lb 9.6 oz (110.5 kg)   SpO2 100%   BMI 38.15 kg/m   BP/Weight 02/19/2018 12/03/2017 12/02/2017  Systolic BP 284 132 -  Diastolic BP 84 69 -  Wt. (Lbs) 243.6 - 233.03  BMI 38.15 - 36.5      Physical Exam  Constitutional: He is oriented to person, place, and time. He appears well-developed and well-nourished.  Cardiovascular: Normal rate, normal heart sounds and intact distal pulses.  No murmur heard. Pulmonary/Chest: Effort normal and breath sounds normal. He has no wheezes. He has no rales. He exhibits no tenderness.  Abdominal: Soft. Bowel sounds are normal. He exhibits no distension and no mass. There is no tenderness.  Musculoskeletal: Normal range of motion.  Neurological: He is alert and oriented to person, place, and time.  Skin: Skin is warm and dry.  Psychiatric: He has a normal mood and affect.     Assessment & Plan:   1. Essential hypertension, benign Slightly elevated Losartan was held during his hospitalization; consider resuming at next visit if blood pressure remains elevated. Continue Toprol, amiodarone Counseled on blood pressure goal of less than 130/80, low-sodium, DASH diet, medication compliance, 150 minutes of moderate intensity exercise per week. Discussed  medication compliance, adverse effects.   2. SICK SINUS SYNDROME Syncopal episodes Follow-up with cardiology  3. Chronic diastolic congestive heart failure (HCC) EF 55-60% Euvolemic Continue Lasix, beta-blocker Consider starting losartan at next visit if blood pressure remains elevated  4. Other emphysema ([place]) Controlled  5. Hypogonadism, male Currently on tamoxifen, Clomid Followed by endocrine   No orders of the defined types were placed in this encounter.   Follow-up: Return in about 3 months (around 05/19/2018) for Follow-up of chronic medical conditions.   Charlott Rakes MD

## 2018-02-21 DIAGNOSIS — Z0100 Encounter for examination of eyes and vision without abnormal findings: Secondary | ICD-10-CM | POA: Diagnosis not present

## 2018-03-10 ENCOUNTER — Other Ambulatory Visit: Payer: Self-pay | Admitting: Internal Medicine

## 2018-03-22 ENCOUNTER — Ambulatory Visit: Payer: Medicare HMO | Admitting: Licensed Clinical Social Worker

## 2018-04-03 DIAGNOSIS — E785 Hyperlipidemia, unspecified: Secondary | ICD-10-CM | POA: Diagnosis not present

## 2018-04-03 DIAGNOSIS — I10 Essential (primary) hypertension: Secondary | ICD-10-CM | POA: Diagnosis not present

## 2018-04-03 DIAGNOSIS — I2510 Atherosclerotic heart disease of native coronary artery without angina pectoris: Secondary | ICD-10-CM | POA: Diagnosis not present

## 2018-04-03 DIAGNOSIS — R002 Palpitations: Secondary | ICD-10-CM | POA: Diagnosis not present

## 2018-04-03 DIAGNOSIS — R55 Syncope and collapse: Secondary | ICD-10-CM | POA: Diagnosis not present

## 2018-04-24 ENCOUNTER — Ambulatory Visit: Payer: Medicare HMO | Admitting: Internal Medicine

## 2018-05-13 DIAGNOSIS — K219 Gastro-esophageal reflux disease without esophagitis: Secondary | ICD-10-CM | POA: Diagnosis not present

## 2018-05-13 DIAGNOSIS — J45909 Unspecified asthma, uncomplicated: Secondary | ICD-10-CM | POA: Diagnosis not present

## 2018-05-13 DIAGNOSIS — Z6837 Body mass index (BMI) 37.0-37.9, adult: Secondary | ICD-10-CM | POA: Diagnosis not present

## 2018-05-13 DIAGNOSIS — D649 Anemia, unspecified: Secondary | ICD-10-CM | POA: Diagnosis not present

## 2018-05-13 DIAGNOSIS — I10 Essential (primary) hypertension: Secondary | ICD-10-CM | POA: Diagnosis not present

## 2018-05-13 DIAGNOSIS — I2510 Atherosclerotic heart disease of native coronary artery without angina pectoris: Secondary | ICD-10-CM | POA: Diagnosis not present

## 2018-05-13 DIAGNOSIS — E785 Hyperlipidemia, unspecified: Secondary | ICD-10-CM | POA: Diagnosis not present

## 2018-05-13 DIAGNOSIS — E6609 Other obesity due to excess calories: Secondary | ICD-10-CM | POA: Diagnosis not present

## 2018-05-16 ENCOUNTER — Ambulatory Visit: Payer: Medicare HMO | Admitting: Internal Medicine

## 2018-05-19 ENCOUNTER — Other Ambulatory Visit: Payer: Self-pay | Admitting: Family Medicine

## 2018-05-22 ENCOUNTER — Ambulatory Visit: Payer: Medicare HMO | Attending: Family Medicine | Admitting: Family Medicine

## 2018-05-22 ENCOUNTER — Encounter: Payer: Self-pay | Admitting: Family Medicine

## 2018-05-22 VITALS — BP 149/81 | HR 63 | Temp 98.0°F | Ht 67.0 in | Wt 244.4 lb

## 2018-05-22 DIAGNOSIS — Z7951 Long term (current) use of inhaled steroids: Secondary | ICD-10-CM | POA: Diagnosis not present

## 2018-05-22 DIAGNOSIS — J438 Other emphysema: Secondary | ICD-10-CM | POA: Diagnosis not present

## 2018-05-22 DIAGNOSIS — I2510 Atherosclerotic heart disease of native coronary artery without angina pectoris: Secondary | ICD-10-CM | POA: Diagnosis not present

## 2018-05-22 DIAGNOSIS — Z9889 Other specified postprocedural states: Secondary | ICD-10-CM | POA: Insufficient documentation

## 2018-05-22 DIAGNOSIS — Z1159 Encounter for screening for other viral diseases: Secondary | ICD-10-CM

## 2018-05-22 DIAGNOSIS — I110 Hypertensive heart disease with heart failure: Secondary | ICD-10-CM | POA: Diagnosis not present

## 2018-05-22 DIAGNOSIS — I10 Essential (primary) hypertension: Secondary | ICD-10-CM

## 2018-05-22 DIAGNOSIS — I495 Sick sinus syndrome: Secondary | ICD-10-CM | POA: Insufficient documentation

## 2018-05-22 DIAGNOSIS — J449 Chronic obstructive pulmonary disease, unspecified: Secondary | ICD-10-CM | POA: Insufficient documentation

## 2018-05-22 DIAGNOSIS — Z79899 Other long term (current) drug therapy: Secondary | ICD-10-CM | POA: Diagnosis not present

## 2018-05-22 DIAGNOSIS — I5042 Chronic combined systolic (congestive) and diastolic (congestive) heart failure: Secondary | ICD-10-CM | POA: Insufficient documentation

## 2018-05-22 DIAGNOSIS — K219 Gastro-esophageal reflux disease without esophagitis: Secondary | ICD-10-CM | POA: Diagnosis not present

## 2018-05-22 DIAGNOSIS — R399 Unspecified symptoms and signs involving the genitourinary system: Secondary | ICD-10-CM | POA: Diagnosis not present

## 2018-05-22 DIAGNOSIS — I5032 Chronic diastolic (congestive) heart failure: Secondary | ICD-10-CM

## 2018-05-22 DIAGNOSIS — I480 Paroxysmal atrial fibrillation: Secondary | ICD-10-CM | POA: Diagnosis not present

## 2018-05-22 DIAGNOSIS — N400 Enlarged prostate without lower urinary tract symptoms: Secondary | ICD-10-CM | POA: Diagnosis not present

## 2018-05-22 DIAGNOSIS — I4892 Unspecified atrial flutter: Secondary | ICD-10-CM | POA: Diagnosis not present

## 2018-05-22 DIAGNOSIS — Z79891 Long term (current) use of opiate analgesic: Secondary | ICD-10-CM | POA: Diagnosis not present

## 2018-05-22 DIAGNOSIS — Z7982 Long term (current) use of aspirin: Secondary | ICD-10-CM | POA: Diagnosis not present

## 2018-05-22 DIAGNOSIS — Z1211 Encounter for screening for malignant neoplasm of colon: Secondary | ICD-10-CM | POA: Diagnosis not present

## 2018-05-22 MED ORDER — ATORVASTATIN CALCIUM 40 MG PO TABS: 40.0000 mg | ORAL_TABLET | Freq: Every day | ORAL | 5 refills | Status: DC

## 2018-05-22 MED ORDER — TAMSULOSIN HCL 0.4 MG PO CAPS: 0.4000 mg | ORAL_CAPSULE | Freq: Every day | ORAL | 5 refills | Status: DC

## 2018-05-22 MED ORDER — METOPROLOL SUCCINATE ER 25 MG PO TB24: 25.0000 mg | ORAL_TABLET | Freq: Every day | ORAL | 5 refills | Status: DC

## 2018-05-22 MED ORDER — LISINOPRIL 5 MG PO TABS: 5.0000 mg | ORAL_TABLET | Freq: Every day | ORAL | 5 refills | Status: DC

## 2018-05-22 MED ORDER — PANTOPRAZOLE SODIUM 40 MG PO TBEC: 40.0000 mg | DELAYED_RELEASE_TABLET | Freq: Every day | ORAL | 5 refills | Status: DC

## 2018-05-22 MED FILL — LISINOPRIL 5 MG TAB: 5 | 30 days supply | Qty: 30 | Fill #0

## 2018-05-22 MED FILL — TAMSULOSIN HCL 0.4 MG CAP: 0.4 | 30 days supply | Qty: 30 | Fill #0

## 2018-05-22 NOTE — Progress Notes (Signed)
Subjective:  Patient ID: [name] [name], male    DOB: [birth year]  Age: 67 y.o. MRN: [id number]  CC: Hypertension   HPI [name] [name]  is a 67 year old male with a history of paroxysmal A. fib, atrial flutter, diastolic congestive heart failure (EF 55-60% in 11/2017), CAD (status post cardiac cath in 11/2015 - mid LAD 20% stenosis, dist LAD 40% stenosis, Ost RCA to prox RCA 30% stenosis, normal LV systolic function), sick sinus syndrome, COPD, GERD, seizures (managed by neurology), BPH, hypogonadism (managed by endocrine) here for a follow-up visit. He continues to be short of breath on mild to moderate exertion but remains active.  Whenever he walks to the mailbox and back he is out of breath but this is not new for him.  He saw his cardiologist, Dr. Terrence Dupont last week and has had no regimen changes. His emphysema has been stable and he remains on his inhalers and is also followed closely by pulmonary. Of note he has gained 10 pounds in the last 5 months. His reflux is controlled on his current regimen and has had no recent seizures.  Doing well on Flomax which he takes for BPH. He has no additional concerns today.  Past Medical History:  Diagnosis Date  . Anemia   . Asthma   . Atrial fibrillation ([place])   . CHF (congestive heart failure) ([place])   . COPD (chronic obstructive pulmonary disease) ([place])   . Heart disease   . Hypertension     Past Surgical History:  Procedure Laterality Date  . CARDIAC CATHETERIZATION N/A 12/08/2015   Procedure: Left Heart Cath and Coronary Angiography;  Surgeon: Charolette Forward, MD;  Location: [facility];  Service: Cardiovascular;  Laterality: N/A;  . ESOPHAGUS SURGERY    . SHOULDER CLOSED REDUCTION Left 12/01/2017   Procedure: CLOSED REDUCTION SHOULDER;  Surgeon: Altamese [place], MD;  Location: [facility];  Service: Orthopedics;  Laterality: Left;    No Known Allergies   Outpatient Medications Prior to Visit  Medication Sig Dispense Refill  .  acetaminophen (TYLENOL) 325 MG tablet Take 2 tablets (650 mg total) by mouth every 6 (six) hours as needed for mild pain (or Fever >/= 101). 30 tablet 0  . albuterol (PROVENTIL HFA;VENTOLIN HFA) 108 (90 Base) MCG/ACT inhaler Inhale 2 puffs into the lungs every 6 (six) hours as needed. 1 Inhaler 5  . albuterol (PROVENTIL) (2.5 MG/3ML) 0.083% nebulizer solution Take 3 mLs (2.5 mg total) by nebulization every 6 (six) hours as needed for wheezing or shortness of breath. 150 mL 1  . amiodarone (PACERONE) 200 MG tablet Take 1 tablet (200 mg total) by mouth 2 (two) times daily. 60 tablet 2  . ARNUITY ELLIPTA 100 MCG/ACT AEPB TAKE 1 PUFF BY MOUTH EVERY DAY 30 each 3  . aspirin EC 81 MG tablet Take 81 mg by mouth daily.      . clomiPHENE (CLOMID) 50 MG tablet Take 50 mg by mouth daily. 1/4 tab daily    . clotrimazole-betamethasone (LOTRISONE) cream Apply 1 application topically 2 (two) times daily as needed (FOR RASH).    Marland Kitchen digoxin (LANOXIN) 0.125 MG tablet Take 1 tablet (0.125 mg total) by mouth daily. 30 tablet 3  . ergocalciferol (DRISDOL) 50000 units capsule Take 1 capsule (50,000 Units total) by mouth once a week. 9 capsule 0  . ferrous sulfate 325 (65 FE) MG tablet Take 325 mg by mouth daily with breakfast. Reported on 03/16/2016    . furosemide (LASIX) 20 MG  tablet TAKE 1 TABLET BY MOUTH EVERY DAY 30 tablet 5  . nitroGLYCERIN (NITROSTAT) 0.4 MG SL tablet Place 1 tablet (0.4 mg total) under the tongue every 5 (five) minutes as needed for chest pain. 25 tablet 1  . potassium chloride (K-DUR) 10 MEQ tablet Take 10 mEq by mouth daily.    . sildenafil (REVATIO) 20 MG tablet Take 20 mg by mouth as needed (for ED).     Marland Kitchen tamoxifen (NOLVADEX) 10 MG tablet Take 10 mg by mouth daily.    . traMADol (ULTRAM) 50 MG tablet Take 1 tablet (50 mg total) by mouth every 8 (eight) hours as needed for severe pain. 20 tablet 0  . UNABLE TO FIND Outpatient PT, OT  Diagnosis: Shoulder dislocation, syncope 1 Mutually Defined  0  . atorvastatin (LIPITOR) 40 MG tablet Take 1 tablet (40 mg total) by mouth daily at 6 PM. 30 tablet 5  . hydrOXYzine (ATARAX/VISTARIL) 10 MG tablet Take 1 tablet (10 mg total) by mouth every 8 (eight) hours as needed for itching. 30 tablet 0  . metoprolol succinate (TOPROL-XL) 25 MG 24 hr tablet Take 1 tablet (25 mg total) by mouth daily. 30 tablet 5  . pantoprazole (PROTONIX) 40 MG tablet Take 1 tablet (40 mg total) by mouth daily. 30 tablet 5  . tamsulosin (FLOMAX) 0.4 MG CAPS capsule Take 1 capsule (0.4 mg total) by mouth daily. 30 capsule 5  . Fluticasone-Salmeterol (ADVAIR) 250-50 MCG/DOSE AEPB Inhale 1 puff into the lungs 2 (two) times daily.     No facility-administered medications prior to visit.     ROS Review of Systems  Constitutional: Negative for activity change and appetite change.  HENT: Negative for sinus pressure and sore throat.   Eyes: Negative for visual disturbance.  Respiratory: Positive for shortness of breath. Negative for cough and chest tightness.   Cardiovascular: Negative for chest pain and leg swelling.  Gastrointestinal: Negative for abdominal distention, abdominal pain, constipation and diarrhea.  Endocrine: Negative.   Genitourinary: Negative for dysuria.  Musculoskeletal: Negative for joint swelling and myalgias.  Skin: Negative for rash.  Allergic/Immunologic: Negative.   Neurological: Negative for weakness, light-headedness and numbness.  Psychiatric/Behavioral: Negative for dysphoric mood and suicidal ideas.    Objective:  BP (!) 149/81   Pulse 63   Temp 98 F (36.7 C) (Oral)   Ht 5' 7" (1.702 m)   Wt 244 lb 6.4 oz (110.9 kg)   SpO2 99%   BMI 38.28 kg/m   BP/Weight 05/22/2018 02/19/2018 93/26/7124  Systolic BP 580 998 338  Diastolic BP 81 84 69  Wt. (Lbs) 244.4 243.6 -  BMI 38.28 38.15 -      Physical Exam  Constitutional: He is oriented to person, place, and time. He appears well-developed and well-nourished.  Neck: No JVD  present.  Cardiovascular: Normal rate, normal heart sounds and intact distal pulses.  No murmur heard. Pulmonary/Chest: Effort normal and breath sounds normal. He has no wheezes. He has no rales. He exhibits no tenderness.  Abdominal: Soft. Bowel sounds are normal. He exhibits no distension and no mass. There is no tenderness.  Musculoskeletal: Normal range of motion.  Neurological: He is alert and oriented to person, place, and time.  Skin: Skin is warm and dry.     CMP Latest Ref Rng & Units 12/02/2017 12/01/2017 04/10/2017  Glucose 65 - 99 mg/dL 88 129(H) 78  BUN 6 - 20 mg/dL _0 Creatinine 0.61 - 1.24 mg/dL 1.18 1.28(H) 1.08  Sodium 135 - 145 mmol/L 136 140 143  Potassium 3.5 - 5.1 mmol/L 4.0 4.3 4.0  Chloride 101 - 111 mmol/L 107 105 101  CO2 22 - 32 mmol/L 20(L) 26 24  Calcium 8.9 - 10.3 mg/dL 8.5(L) 8.7(L) 9.4  Total Protein 6.5 - 8.1 g/dL - 7.7 8.2  Total Bilirubin 0.3 - 1.2 mg/dL - 0.2(L) 0.4  Alkaline Phos 38 - 126 U/L - 89 94  AST 15 - 41 U/L - 28 20  ALT 17 - 63 U/L - 21 12    Lipid Panel     Component Value Date/Time   CHOL 159 04/10/2017 1108   TRIG 116 04/10/2017 1108   HDL 41 04/10/2017 1108   CHOLHDL 3.9 04/10/2017 1108   CHOLHDL 2.5 12/09/2015 0133   VLDL 11 12/09/2015 0133   LDLCALC 95 04/10/2017 1108    Assessment & Plan:   1. Lower urinary tract symptoms Controlled - tamsulosin (FLOMAX) 0.4 MG CAPS capsule; Take 1 capsule (0.4 mg total) by mouth daily.  Dispense: 30 capsule; Refill: 5  2. Other emphysema ([place]) Stable  3. Chronic diastolic congestive heart failure (HCC) EF of 55 to 60% from echo of 11/2017 Euvolemic Recent weight gain could explain dyspnea Limit sodium intake, daily weights, heart healthy diet - atorvastatin (LIPITOR) 40 MG tablet; Take 1 tablet (40 mg total) by mouth daily at 6 PM.  Dispense: 30 tablet; Refill: 5 - CMP14+EGFR; Future - Lipid panel; Future  4. Essential hypertension, benign Slightly elevated No regimen  change today Low sodium, DASH diet - metoprolol succinate (TOPROL-XL) 25 MG 24 hr tablet; Take 1 tablet (25 mg total) by mouth daily.  Dispense: 30 tablet; Refill: 5 - lisinopril (PRINIVIL,ZESTRIL) 5 MG tablet; Take 1 tablet (5 mg total) by mouth daily.  Dispense: 30 tablet; Refill: 5  5. Sick sinus syndrome (HCC) No recent syncopal episodes  6. Screening for colon cancer - Ambulatory referral to Gastroenterology  7. Need for hepatitis C screening test - Hepatitis c antibody (reflex); Future  8. High risk medication use He is on amiodarone and we will check a thyroid function - T4, free; Future - TSH; Future   Meds ordered this encounter  Medications  . tamsulosin (FLOMAX) 0.4 MG CAPS capsule    Sig: Take 1 capsule (0.4 mg total) by mouth daily.    Dispense:  30 capsule    Refill:  5  . atorvastatin (LIPITOR) 40 MG tablet    Sig: Take 1 tablet (40 mg total) by mouth daily at 6 PM.    Dispense:  30 tablet    Refill:  5  . metoprolol succinate (TOPROL-XL) 25 MG 24 hr tablet    Sig: Take 1 tablet (25 mg total) by mouth daily.    Dispense:  30 tablet    Refill:  5  . pantoprazole (PROTONIX) 40 MG tablet    Sig: Take 1 tablet (40 mg total) by mouth daily.    Dispense:  30 tablet    Refill:  5  . lisinopril (PRINIVIL,ZESTRIL) 5 MG tablet    Sig: Take 1 tablet (5 mg total) by mouth daily.    Dispense:  30 tablet    Refill:  5    Follow-up: Return in about 3 months (around 08/22/2018) for Follow-up of chronic medical conditions.   Charlott Rakes MD

## 2018-05-22 NOTE — Patient Instructions (Signed)
Sick Sinus Syndrome Sick sinus syndrome is a group of conditions that affect heart rhythm and how quickly the heart beats (heart rate). When you have sick sinus syndrome, your heart rate may be too fast (tachycardia) or too slow (bradycardia), or it may switch between fast and slow. What are the causes? Your heartbeat is controlled by a structure in the heart called the sinoatrial (SA) node. Sick sinus syndrome happens when the SA node does not work properly (SA node dysfunction). It is not known what causes this. What increases the risk? This syndrome is more likely to develop in people who:  Are age 65 or older.  Have a family history of the syndrome.  Have had a heart attack or heart surgery.  Have sleep apnea.  Have coronary artery disease.  Have inflammation of the heart muscle (myocarditis) or the sac that surrounds the heart (pericarditis).  Use medicines such as beta blockers, some calcium channel blockers, or digoxin.  Have an abnormal level of thyroid hormone or electrolytes.  Have high blood pressure (hypertension).  Have had infections that affect the heart, like rheumatic fever or diphtheria.  Are born with heart defects (congenital heart disease).  What are the signs or symptoms? Symptom of this syndrome include:  Fainting or feeling like you are going to faint.  Chest pain.  Shortness of breath.  Dizziness.  Feeling like your heart skips beats.  Feeling like your heart beats very quickly.  Tiredness.  Fatigue.  Confusion.  Some people with this condition do not have any symptoms. Most people have few symptoms or very mild symptoms. How is this diagnosed? This syndrome may be diagnosed with tests, such as:  A test that measures electrical activity in the heart (electrocardiogram, or ECG).  A test in which you wear a device called a Holter monitor for 1-2 days. The device will record your heart's electrical signals.  Placing a device to record  the electrical activity of your heart over a longer period of time (implantable loop recorder).  An test to look at the electrical system of your heart (electrophysiology study).  Blood tests.  How is this treated? Treatment for this syndrome may include:  Surgery to put a small, electrical device in your chest to correct your heartbeat (pacemaker).  Medicines to keep your heart from beating too quickly.  Stopping taking certain medicines as told by your health care provider.  Treatment of the underlying condition.  If the syndrome is not causing any symptoms, you may not need treatment. Follow these instructions at home:  Take over-the-counter and prescription medicines only as told by your health care provider.  Stay active and stay at a healthy weight. Ask your health care provider what level of activity is safe for you.  Eat a heart-healthy diet that includes fruits, vegetables, whole grains, low-fat dairy products, and lean proteins like poultry and eggs. Ask your health care provider if you should: ? Avoid any foods or drinks. ? Limit your salt (sodium) intake.  Limit alcohol intake to no more than 1 drink a day for nonpregnant women and 2 drinks a day for men. One drink equals 12 oz of beer, 5 oz of wine, or 1 oz of hard liquor.  Do not use any products that contain nicotine or tobacco, such as cigarettes and e-cigarettes. If you need help quitting, ask your health care provider.  Keep all follow-up visits as told by your health care provider. This is important. Contact a health care provider   if:  You have a cough that does not go away.  You have swelling in your feet or ankles.  You feel short of breath with activity.  You feel fatigued. Get help right away if:  You have chest pain or difficulty breathing.  You suddenly have weakness, numbness, or loss of movement on one side of your body.  You suddenly become very confused.  You suddenly lose the ability to  speak or understand speech.  You feel like your heart is skipping beats.  You feel like your heart is beating very quickly.  You faint or feel like you are going to faint. These symptoms may represent a serious problem that is an emergency. Do not wait to see if the symptoms will go away. Get medical help right away. Call your local emergency services (911 in the [place] Do not drive yourself to the hospital. Summary  When you have sick sinus syndrome, your heart rate may be too fast (tachycardia) or too slow (bradycardia), or it may switch between fast and slow.  Treatment for this syndrome may include taking medicines and having a pacemaker placed in your chest.  If you have chest pain or difficulty breathing, get help right away. Do not drive yourself to the hospital. This information is not intended to replace advice given to you by your health care provider. Make sure you discuss any questions you have with your health care provider. Document Released: 11/29/2009 Document Revised: 08/17/2016 Document Reviewed: 08/08/2016 Elsevier Interactive Patient Education  2017 Elsevier Inc.  

## 2018-05-23 ENCOUNTER — Ambulatory Visit: Payer: Medicare HMO | Attending: Family Medicine

## 2018-05-23 DIAGNOSIS — Z1159 Encounter for screening for other viral diseases: Secondary | ICD-10-CM | POA: Insufficient documentation

## 2018-05-23 DIAGNOSIS — Z79899 Other long term (current) drug therapy: Secondary | ICD-10-CM

## 2018-05-23 DIAGNOSIS — I5032 Chronic diastolic (congestive) heart failure: Secondary | ICD-10-CM

## 2018-05-23 NOTE — Progress Notes (Signed)
Patient here for lab visit  

## 2018-05-24 LAB — CMP14+EGFR
ALK PHOS: 95 IU/L (ref 39–117)
ALT: 13 IU/L (ref 0–44)
AST: 16 IU/L (ref 0–40)
Albumin/Globulin Ratio: 0.9 — ABNORMAL LOW (ref 1.2–2.2)
Albumin: 3.6 g/dL (ref 3.6–4.8)
BUN/Creatinine Ratio: 12 (ref 10–24)
BUN: 15 mg/dL (ref 8–27)
Bilirubin Total: 0.4 mg/dL (ref 0.0–1.2)
CO2: 26 mmol/L (ref 20–29)
CREATININE: 1.24 mg/dL (ref 0.76–1.27)
Calcium: 9.1 mg/dL (ref 8.6–10.2)
Chloride: 105 mmol/L (ref 96–106)
GFR calc Af Amer: 68 mL/min/{1.73_m2} (ref >59)
GFR calc non Af Amer: 59 mL/min/{1.73_m2} — ABNORMAL LOW (ref >59)
GLUCOSE: 77 mg/dL (ref 65–99)
Globulin, Total: 3.9 g/dL (ref 1.5–4.5)
Potassium: 4.2 mmol/L (ref 3.5–5.2)
Sodium: 144 mmol/L (ref 134–144)
Total Protein: 7.5 g/dL (ref 6.0–8.5)

## 2018-05-24 LAB — TSH: TSH: 1.58 u[IU]/mL (ref 0.450–4.500)

## 2018-05-24 LAB — LIPID PANEL
Chol/HDL Ratio: 3 ratio (ref 0.0–5.0)
Cholesterol, Total: 162 mg/dL (ref 100–199)
HDL: 54 mg/dL (ref >39)
LDL CALC: 93 mg/dL (ref 0–99)
Triglycerides: 77 mg/dL (ref 0–149)
VLDL CHOLESTEROL CAL: 15 mg/dL (ref 5–40)

## 2018-05-24 LAB — T4, FREE: Free T4: 1.02 ng/dL (ref 0.82–1.77)

## 2018-05-24 LAB — HEPATITIS C ANTIBODY (REFLEX)

## 2018-05-24 LAB — HCV COMMENT:

## 2018-05-28 ENCOUNTER — Ambulatory Visit: Payer: Medicare HMO | Admitting: Internal Medicine

## 2018-05-29 ENCOUNTER — Telehealth: Payer: Self-pay

## 2018-05-29 NOTE — Telephone Encounter (Signed)
Patient came to facility and was informed of lab results.

## 2018-05-29 NOTE — Telephone Encounter (Signed)
Patient was called and informed to contact office for lab results.   If patient returns phone call please inform patient that all labs are normal.

## 2018-05-30 ENCOUNTER — Ambulatory Visit (INDEPENDENT_AMBULATORY_CARE_PROVIDER_SITE_OTHER): Payer: Medicare HMO | Admitting: Adult Health

## 2018-05-30 ENCOUNTER — Encounter: Payer: Self-pay | Admitting: Adult Health

## 2018-05-30 VITALS — BP 112/72 | HR 78 | Ht 67.0 in | Wt 242.0 lb

## 2018-05-30 DIAGNOSIS — J4540 Moderate persistent asthma, uncomplicated: Secondary | ICD-10-CM

## 2018-05-30 DIAGNOSIS — J4541 Moderate persistent asthma with (acute) exacerbation: Secondary | ICD-10-CM

## 2018-05-30 LAB — NITRIC OXIDE: Nitric Oxide: 50

## 2018-05-30 MED ORDER — FLUTICASONE-SALMETEROL 250-50 MCG/DOSE IN AEPB: 1.0000 | INHALATION_SPRAY | Freq: Two times a day (BID) | RESPIRATORY_TRACT | 5 refills | Status: DC

## 2018-05-30 NOTE — Patient Instructions (Addendum)
Stop Arnuity .  Restart Advair 1 puff Twice daily  . Rinse after use.  Discuss with Cardiology that Lisinopril make aggravate your cough and wheezing .  Follow up with Dr. Chase Caller in 3 months and As needed

## 2018-05-30 NOTE — Addendum Note (Signed)
Addended by: Parke Poisson E on: 05/30/2018 10:55 AM   Modules accepted: Orders

## 2018-05-30 NOTE — Progress Notes (Signed)
@Patient  ID: [name] [name], male    DOB: 06-Aug-1951, 67 y.o.   MRN: [id number]  Chief Complaint  Patient presents with  . Follow-up    Asthna     Referring provider: Charlott Rakes, MD  HPI: 67 year old male never smoker seen for pulmonary consult during hospitalization December 2016 for left lower lobe hospital-acquired pneumonia and pleuropericarditis Followed for Asthma   TEST  CT chest 12/14/2015  showed moderate pericardial effusion . , small to mod left and small right pleural effusion and patchy bilateral lower lobe opacities. 2 D echo showed mild pericardial effusion with no evidence of tamponade. Autoimmune/Connective tissue disorder workup w/ repeat Rheumatoid factor neg.  Neg ANA . Scleroderma NEG . 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 .   CT scan of the chest December 13 2015 shows moderate pericardial effusion and some mild bilateral pleural effusions and compressive atelectasis   Echocardiogram 12/14/2015 that followed this showed only trace pericardial effusion without any tamponade physiology CXR Jan 2017  - clear per reprot  Pulmonary function test 03/29/2016 shows FEV1 1.87 L/72% FVC 2.42 L/72%. Both of post bronchodilator response. Ratio 77. TLC 4.4 L/69% - this is all consistent with restriction and his significant obesity. His DLCO is 23.8 and normal   Walking desaturation test on 11/13/2017 185 feet on RA with forehead probe x 3 laps:  did NOT desaturate. Rest pulse ox was 100%, final pulse ox was 100%. HR response 63/min at rest to 81/min at peak exertion.  [place] 11/2017 83>05/30/2018 50 ppb   05/30/2018 Follow up : Asthma  Pt returns for 6 month follow up .  Last visit patient was changed from [place] to [place], feels that this did not work as well as his Advair.  Felt like his breathing was better controlled on Advair.  Patient was also recommended to discuss with his cardiologist that lisinopril may be aggravating his wheezing and cough.  Unfortunately patient did not do  this.  We discussed that intermittent wheezing and dry cough can sometimes be a side effect of lisinopril. Patient denies any chest pain orthopnea PND or increased leg swelling. Patient says he does have some intermittent wheezing on occasion.  Overall says he is trying to maintain his activity level.  Try to eat a healthy diet and keep weight down. Exhaled nitric oxide testing today was 50 ppb     No Known Allergies  Immunization History  Administered Date(s) Administered  . Influenza,inj,Quad PF,6+ Mos 12/09/2015    Past Medical History:  Diagnosis Date  . Anemia   . Asthma   . Atrial fibrillation ([place])   . CHF (congestive heart failure) ([place])   . COPD (chronic obstructive pulmonary disease) ([place])   . Heart disease   . Hypertension     Tobacco History: Social History   Tobacco Use  Smoking Status Never Smoker  Smokeless Tobacco Never Used   Counseling given: Not Answered   Outpatient Encounter Medications as of 05/30/2018  Medication Sig  . acetaminophen (TYLENOL) 325 MG tablet Take 2 tablets (650 mg total) by mouth every 6 (six) hours as needed for mild pain (or Fever >/= 101).  Marland Kitchen albuterol (PROVENTIL HFA;VENTOLIN HFA) 108 (90 Base) MCG/ACT inhaler Inhale 2 puffs into the lungs every 6 (six) hours as needed.  Marland Kitchen albuterol (PROVENTIL) (2.5 MG/3ML) 0.083% nebulizer solution Take 3 mLs (2.5 mg total) by nebulization every 6 (six) hours as needed for wheezing or shortness of breath.  Marland Kitchen amiodarone (PACERONE) 200 MG tablet Take 1 tablet (  200 mg total) by mouth 2 (two) times daily.  . ARNUITY ELLIPTA 100 MCG/ACT AEPB TAKE 1 PUFF BY MOUTH EVERY DAY  . aspirin EC 81 MG tablet Take 81 mg by mouth daily.    Marland Kitchen atorvastatin (LIPITOR) 40 MG tablet Take 1 tablet (40 mg total) by mouth daily at 6 PM.  . clomiPHENE (CLOMID) 50 MG tablet Take 50 mg by mouth daily. 1/4 tab daily  . clotrimazole-betamethasone (LOTRISONE) cream Apply 1 application topically 2 (two) times daily as needed (FOR  RASH).  Marland Kitchen digoxin (LANOXIN) 0.125 MG tablet Take 1 tablet (0.125 mg total) by mouth daily.  . ergocalciferol (DRISDOL) 50000 units capsule Take 1 capsule (50,000 Units total) by mouth once a week.  . ferrous sulfate 325 (65 FE) MG tablet Take 325 mg by mouth daily with breakfast. Reported on 03/16/2016  . furosemide (LASIX) 20 MG tablet TAKE 1 TABLET BY MOUTH EVERY DAY  . lisinopril (PRINIVIL,ZESTRIL) 5 MG tablet Take 1 tablet (5 mg total) by mouth daily.  . metoprolol succinate (TOPROL-XL) 25 MG 24 hr tablet Take 1 tablet (25 mg total) by mouth daily.  . nitroGLYCERIN (NITROSTAT) 0.4 MG SL tablet Place 1 tablet (0.4 mg total) under the tongue every 5 (five) minutes as needed for chest pain.  . pantoprazole (PROTONIX) 40 MG tablet Take 1 tablet (40 mg total) by mouth daily.  . potassium chloride (K-DUR) 10 MEQ tablet Take 10 mEq by mouth daily.  . sildenafil (REVATIO) 20 MG tablet Take 20 mg by mouth as needed (for ED).   Marland Kitchen tamoxifen (NOLVADEX) 10 MG tablet Take 10 mg by mouth daily.  . tamsulosin (FLOMAX) 0.4 MG CAPS capsule Take 1 capsule (0.4 mg total) by mouth daily.  . traMADol (ULTRAM) 50 MG tablet Take 1 tablet (50 mg total) by mouth every 8 (eight) hours as needed for severe pain.  Marland Kitchen UNABLE TO FIND Outpatient PT, OT  Diagnosis: Shoulder dislocation, syncope  . Fluticasone-Salmeterol (ADVAIR) 250-50 MCG/DOSE AEPB Inhale 1 puff into the lungs 2 (two) times daily.   No facility-administered encounter medications on file as of 05/30/2018.      Review of Systems  Constitutional:   No  weight loss, night sweats,  Fevers, chills, fatigue, or  lassitude.  HEENT:   No headaches,  Difficulty swallowing,  Tooth/dental problems, or  Sore throat,                No sneezing, itching, ear ache, nasal congestion, post nasal drip,   CV:  No chest pain,  Orthopnea, PND, swelling in lower extremities, anasarca, dizziness, palpitations, syncope.   GI  No heartburn, indigestion, abdominal pain,  nausea, vomiting, diarrhea, change in bowel habits, loss of appetite, bloody stools.   Resp:   No chest wall deformity  Skin: no rash or lesions.  GU: no dysuria, change in color of urine, no urgency or frequency.  No flank pain, no hematuria   MS:  No joint pain or swelling.  No decreased range of motion.  No back pain.    Physical Exam  BP 112/72 (BP Location: Left Arm, Cuff Size: Normal)   Pulse 78   Ht 5\' 7"  (1.702 m)   Wt 242 lb (109.8 kg)   SpO2 97%   BMI 37.90 kg/m   GEN: A/Ox3; pleasant , NAD, obese    HEENT:  [place]/AT,  EACs-clear, TMs-wnl, NOSE-clear, THROAT-clear, no lesions, no postnasal drip or exudate noted.   NECK:  Supple w/ fair ROM; no JVD;  normal carotid impulses w/o bruits; no thyromegaly or nodules palpated; no lymphadenopathy.    RESP  Clear  P & A; w/o, wheezes/ rales/ or rhonchi. no accessory muscle use, no dullness to percussion  CARD:  RRR, no m/r/g, tr  peripheral edema, pulses intact, no cyanosis or clubbing.  GI:   Soft & nt; nml bowel sounds; no organomegaly or masses detected.   Musco: Warm bil, no deformities or joint swelling noted.   Neuro: alert, no focal deficits noted.    Skin: Warm, no lesions or rashes    Lab Results:  CBC  BMET  BNP   Imaging: No results found.   Assessment & Plan:   Asthma exacerbation Asthma with increased symptoms since switching from Advair to Arnuity.  Will change back to Advair as patient felt that he was better controlled.  I have also recommended that lisinopril be considered to be changed as could be aggravating cough and wheezing. Hold on steroids for now , control triggers  Change to ICS/LABA   Plan  Patient Instructions  Stop Arnuity .  Restart Advair 1 puff Twice daily  . Rinse after use.  Discuss with Cardiology that Lisinopril make aggravate your cough and wheezing .  Follow up with Dr. Chase Caller in 6 months and As needed           Rexene Edison, NP 05/30/2018

## 2018-05-30 NOTE — Assessment & Plan Note (Addendum)
Asthma with increased symptoms since switching from Advair to [place].  Will change back to Advair as patient felt that he was better controlled.  I have also recommended that lisinopril be considered to be changed as could be aggravating cough and wheezing. Hold on steroids for now , control triggers  Change to ICS/LABA   Plan  Patient Instructions  Stop Arnuity .  Restart Advair 1 puff Twice daily  . Rinse after use.  Discuss with Cardiology that Lisinopril make aggravate your cough and wheezing .  Follow up with Dr. Chase Caller in 3 months and As needed

## 2018-06-20 ENCOUNTER — Ambulatory Visit: Payer: Medicare HMO | Attending: Family Medicine | Admitting: Physician Assistant

## 2018-06-20 VITALS — BP 108/73 | HR 81 | Temp 98.2°F | Resp 18 | Ht 67.0 in | Wt 243.0 lb

## 2018-06-20 DIAGNOSIS — I10 Essential (primary) hypertension: Secondary | ICD-10-CM

## 2018-06-20 DIAGNOSIS — I110 Hypertensive heart disease with heart failure: Secondary | ICD-10-CM | POA: Insufficient documentation

## 2018-06-20 DIAGNOSIS — Z79899 Other long term (current) drug therapy: Secondary | ICD-10-CM | POA: Insufficient documentation

## 2018-06-20 DIAGNOSIS — I4891 Unspecified atrial fibrillation: Secondary | ICD-10-CM | POA: Insufficient documentation

## 2018-06-20 DIAGNOSIS — J449 Chronic obstructive pulmonary disease, unspecified: Secondary | ICD-10-CM | POA: Insufficient documentation

## 2018-06-20 DIAGNOSIS — I509 Heart failure, unspecified: Secondary | ICD-10-CM | POA: Diagnosis not present

## 2018-06-20 DIAGNOSIS — M25512 Pain in left shoulder: Secondary | ICD-10-CM | POA: Diagnosis not present

## 2018-06-20 DIAGNOSIS — Z7982 Long term (current) use of aspirin: Secondary | ICD-10-CM | POA: Insufficient documentation

## 2018-06-20 DIAGNOSIS — G8929 Other chronic pain: Secondary | ICD-10-CM | POA: Insufficient documentation

## 2018-06-20 MED ORDER — METHOCARBAMOL 500 MG PO TABS: 500.0000 mg | ORAL_TABLET | Freq: Three times a day (TID) | ORAL | 0 refills | Status: DC | PRN

## 2018-06-20 NOTE — Progress Notes (Signed)
Patient ID: [name] [name], male   DOB: Oct 23, 1951, 67 y.o.   MRN: [id number]      [name] [name], is a 67 y.o. male  [name] [name]  [name] [name]  DOB - [birth year]  Subjective:  Chief Complaint and HPI: [name] [name] is a 67 y.o. male here today for Injured/dislocated L shoulder just before Christmas.  Still having pain and now has decreased function of L arm too. Not taking anything for pain.  Post reduction film 12/01/2017: Interval reduction of previously noted anterior shoulder dislocation. Osseous irregularity at the inferior glenoid may represent a subtle bony [place] lesion.  ROS:   Constitutional:  No f/c, No night sweats, No unexplained weight loss. EENT:  No vision changes, No blurry vision, No hearing changes. No mouth, throat, or ear problems.  Respiratory: No cough, No SOB Cardiac: No CP, no palpitations GI:  No abd pain, No N/V/D. GU: No Urinary s/sx Musculoskeletal: +L shoulder pain Neuro: No headache, no dizziness, no motor weakness.  Skin: No rash Endocrine:  No polydipsia. No polyuria.  Psych: Denies SI/HI  No problems updated.  ALLERGIES: No Known Allergies  PAST MEDICAL HISTORY: Past Medical History:  Diagnosis Date  . Anemia   . Asthma   . Atrial fibrillation ([place])   . CHF (congestive heart failure) ([place])   . COPD (chronic obstructive pulmonary disease) ([place])   . Heart disease   . Hypertension     MEDICATIONS AT HOME: Prior to Admission medications   Medication Sig Start Date End Date Taking? Authorizing Provider  acetaminophen (TYLENOL) 325 MG tablet Take 2 tablets (650 mg total) by mouth every 6 (six) hours as needed for mild pain (or Fever >/= 101). 12/03/17  Yes Rai, Ripudeep K, MD  albuterol (PROVENTIL HFA;VENTOLIN HFA) 108 (90 Base) MCG/ACT inhaler Inhale 2 puffs into the lungs every 6 (six) hours as needed. 11/19/17  Yes Charlott Rakes, MD  albuterol (PROVENTIL) (2.5 MG/3ML) 0.083% nebulizer solution Take 3 mLs (2.5 mg total) by  nebulization every 6 (six) hours as needed for wheezing or shortness of breath. 08/17/17  Yes Charlott Rakes, MD  amiodarone (PACERONE) 200 MG tablet Take 1 tablet (200 mg total) by mouth 2 (two) times daily. 12/03/17  Yes Rai, Ripudeep K, MD  aspirin EC 81 MG tablet Take 81 mg by mouth daily.     Yes [provider]  atorvastatin (LIPITOR) 40 MG tablet Take 1 tablet (40 mg total) by mouth daily at 6 PM. 05/22/18  Yes Newlin, Enobong, MD  clomiPHENE (CLOMID) 50 MG tablet Take 50 mg by mouth daily. 1/4 tab daily   Yes [provider]  clotrimazole-betamethasone (LOTRISONE) cream Apply 1 application topically 2 (two) times daily as needed (FOR RASH). 12/03/17  Yes Rai, Ripudeep K, MD  digoxin (LANOXIN) 0.125 MG tablet Take 1 tablet (0.125 mg total) by mouth daily. 12/03/17  Yes Rai, Ripudeep K, MD  ergocalciferol (DRISDOL) 50000 units capsule Take 1 capsule (50,000 Units total) by mouth once a week. 11/20/17  Yes Charlott Rakes, MD  ferrous sulfate 325 (65 FE) MG tablet Take 325 mg by mouth daily with breakfast. Reported on 03/16/2016   Yes [provider]  Fluticasone-Salmeterol (ADVAIR) 250-50 MCG/DOSE AEPB Inhale 1 puff into the lungs 2 (two) times daily. 05/30/18  Yes Brand Males, MD  furosemide (LASIX) 20 MG tablet TAKE 1 TABLET BY MOUTH EVERY DAY 05/21/18  Yes Newlin, Enobong, MD  lisinopril (PRINIVIL,ZESTRIL) 5 MG tablet Take 1 tablet (5 mg total) by mouth daily. 05/22/18  Yes Charlott Rakes, MD  metoprolol succinate (TOPROL-XL) 25 MG 24 hr tablet Take 1 tablet (25 mg total) by mouth daily. 05/22/18  Yes Charlott Rakes, MD  nitroGLYCERIN (NITROSTAT) 0.4 MG SL tablet Place 1 tablet (0.4 mg total) under the tongue every 5 (five) minutes as needed for chest pain. 01/25/14  Yes Dixie Dials, MD  pantoprazole (PROTONIX) 40 MG tablet Take 1 tablet (40 mg total) by mouth daily. 05/22/18  Yes Charlott Rakes, MD  potassium chloride (K-DUR) 10 MEQ tablet Take 10 mEq by mouth  daily.   Yes [provider]  sildenafil (REVATIO) 20 MG tablet Take 20 mg by mouth as needed (for ED).    Yes [provider]  tamoxifen (NOLVADEX) 10 MG tablet Take 10 mg by mouth daily.   Yes [provider]  tamsulosin (FLOMAX) 0.4 MG CAPS capsule Take 1 capsule (0.4 mg total) by mouth daily. 05/22/18  Yes Charlott Rakes, MD  traMADol (ULTRAM) 50 MG tablet Take 1 tablet (50 mg total) by mouth every 8 (eight) hours as needed for severe pain. 12/03/17 12/03/18 Yes Rai, Ripudeep Raliegh Ip, MD  UNABLE TO FIND Outpatient PT, OT  Diagnosis: Shoulder dislocation, syncope 12/03/17  Yes Rai, Ripudeep K, MD  methocarbamol (ROBAXIN) 500 MG tablet Take 1 tablet (500 mg total) by mouth every 8 (eight) hours as needed for muscle spasms. Prn pain 06/20/18   Argentina Donovan, PA-C     Objective:  EXAM:   Vitals:   06/20/18 1349  BP: 108/73  Pulse: 81  Resp: 18  Temp: 98.2 F (36.8 C)  TempSrc: Oral  SpO2: 99%  Weight: 243 lb (110.2 kg)  Height: 5\' 7"  (1.702 m)    General appearance : A&OX3. NAD. Non-toxic-appearing HEENT: Atraumatic and Normocephalic.  PERRLA. EOM intact.   Neck: supple, no JVD. No cervical lymphadenopathy. No thyromegaly Chest/Lungs:  Breathing-non-labored, Good air entry bilaterally, breath sounds normal without rales, rhonchi, or wheezing  CVS: S1 S2 regular, no murmurs, gallops, rubs  L shoulder-no point tenderness.  No overlying erythema.  Decreased ROM-unable to perform ligament stability testing due to current pain.   Extremities: Bilateral Lower Ext shows no edema, both legs are warm to touch with = pulse throughout Neurology:  CN II-XII grossly intact, Non focal.   Psych:  TP linear. J/I WNL. Normal speech. Appropriate eye contact and affect.  Skin:  No Rash  Data Review Lab Results  Component Value Date   HGBA1C 5.8 (H) 12/08/2015   HGBA1C 5.4 01/23/2014   HGBA1C 6.1 (H) 03/25/2013     Assessment & Plan   1. Essential hypertension,  benign Controlled-continue current regimen  2. Chronic left shoulder pain Worsening since injury 11/2017.  Take tylenol - Ambulatory referral to Orthopedic Surgery add- methocarbamol (ROBAXIN) 500 MG tablet; Take 1 tablet (500 mg total) by mouth every 8 (eight) hours as needed for muscle spasms. Prn pain  Dispense: 90 tablet; Refill: 0     Patient have been counseled extensively about nutrition and exercise  Return for keep 9/3 appt with [name] [name].  The patient was given clear instructions to go to ER or return to medical center if symptoms don't improve, worsen or new problems develop. The patient verbalized understanding. The patient was told to call to get lab results if they haven't heard anything in the next week.     Freeman Caldron, PA-C [facility] and [place], [place]   06/20/2018, 1:59 PM

## 2018-06-26 ENCOUNTER — Ambulatory Visit (INDEPENDENT_AMBULATORY_CARE_PROVIDER_SITE_OTHER): Payer: Medicare HMO | Admitting: Orthopaedic Surgery

## 2018-06-26 ENCOUNTER — Telehealth (INDEPENDENT_AMBULATORY_CARE_PROVIDER_SITE_OTHER): Payer: Self-pay | Admitting: Orthopaedic Surgery

## 2018-06-26 NOTE — Telephone Encounter (Signed)
See message below. Next appt scheduled 7/10

## 2018-06-26 NOTE — Telephone Encounter (Signed)
Patient is a new patient of Dr. Phoebe Sharps. He cancelled his appt due to not feeling good today. He is coming in to be seen for his left shoulder next week but he states that for the past week he can barely move/lift his left arm. He is wondering if a warm compression on it the night before would help, or if anything else you can advise. Patients # [id number]

## 2018-07-03 ENCOUNTER — Encounter (INDEPENDENT_AMBULATORY_CARE_PROVIDER_SITE_OTHER): Payer: Self-pay | Admitting: Orthopaedic Surgery

## 2018-07-03 ENCOUNTER — Ambulatory Visit (INDEPENDENT_AMBULATORY_CARE_PROVIDER_SITE_OTHER): Payer: Medicare HMO

## 2018-07-03 ENCOUNTER — Ambulatory Visit (INDEPENDENT_AMBULATORY_CARE_PROVIDER_SITE_OTHER): Payer: Medicare HMO | Admitting: Orthopaedic Surgery

## 2018-07-03 DIAGNOSIS — G8929 Other chronic pain: Secondary | ICD-10-CM | POA: Insufficient documentation

## 2018-07-03 DIAGNOSIS — M25512 Pain in left shoulder: Secondary | ICD-10-CM

## 2018-07-03 NOTE — Progress Notes (Signed)
Office Visit Note   Patient: [name] [name]           Date of Birth: 19-Oct-1951           MRN: [id number] Visit Date: 07/03/2018              Requested by: Argentina Donovan, PA-C [place], [place] [address] PCP: Charlott Rakes, MD   Assessment & Plan: Visit Diagnoses:  1. Chronic left shoulder pain     Plan: Impression is left shoulder rotator cuff tear.  We will obtain an MRI to further assess this.  He will follow-up with [place] once that has been completed.    Follow-Up Instructions: Return in about 10 days (around 07/13/2018).   Orders:  Orders Placed This Encounter  Procedures  . XR Shoulder Left   No orders of the defined types were placed in this encounter.     Procedures: No procedures performed   Clinical Data: No additional findings.   Subjective: Chief Complaint  Patient presents with  . Left Shoulder - Pain    HPI patient is a pleasant 67 year old gentleman who presents to our clinic today with left shoulder pain.  He sustained a fall back in December 2018.  He was seen in the ED where he was found to have a left shoulder dislocation.  This was reduced.  He wore a sling for a few days.  He never followed up with an orthopedist.  Since then he has had pain and weakness to the left shoulder radiating into the deltoid.  This does seem to be worse in the mornings.  He does have occasional night pain as well.  No numbness, tingling or burning.  He has tried over-the-counter medications without relief of symptoms.  No previous surgical intervention or cortisone injection to the left shoulder.  Review of Systems as detailed in HPI.  All others reviewed and are negative.   Objective: Vital Signs: There were no vitals taken for this visit.  Physical Exam well-developed well-nourished gentleman in no acute distress.  Alert and oriented x3.  Ortho Exam examination of the left shoulder shows about 60 to 70% active range of motion.  I can passively get  him to full forward flexion.  Positive empty can and positive drop arm.  He is neurovascularly intact distally.  Specialty Comments:  No specialty comments available.  Imaging: Xr Shoulder Left  Result Date: 07/03/2018 X-rays show moderate AC joint  arthropathy.  No superior migration of the humeral head    PMFS History: Patient Active Problem List   Diagnosis Date Noted  . Chronic left shoulder pain 07/03/2018  . Syncope 12/01/2017  . Dislocated shoulder 12/01/2017  . UTI (urinary tract infection) 12/01/2017  . Confusion 06/12/2017  . Elevated sed rate 12/26/2016  . History of gastroesophageal reflux (GERD) 12/22/2016  . History of asthma 12/22/2016  . History of COPD 12/22/2016  . Benign prostatic hyperplasia 03/21/2016  . Abnormal involuntary movements 03/08/2016  . COPD (chronic obstructive pulmonary disease) ([place]) 12/24/2015  . Pleural effusion on left   . HCAP (healthcare-associated pneumonia) 12/15/2015  . Left lower lobe pneumonia ([place])   . Sepsis due to pneumonia ([place]) 11/27/2015  . Acute respiratory failure ([place]) 11/27/2015  . Asthma exacerbation 11/27/2015  . Anemia 11/27/2015  . CHF (congestive heart failure) ([place]) 11/27/2015  . Sepsis ([place]) 11/27/2015  . Screening for prostate cancer 03/03/2015  . Gynecomastia 03/03/2015  . Seizures ([place]) 10/09/2014  . Essential hypertension 08/12/2014  .  Chest pain 01/24/2014  . Sick sinus syndrome ([place]) 03/21/2013  . TINEA CRURIS 09/30/2010  . Hypogonadism, male 09/23/2010  . GERD 09/01/2010  . DIZZINESS 09/01/2010  . CHEST PAIN UNSPECIFIED 09/01/2010  . Sleep apnea 08/20/2010  . Other fatigue 06/28/2010  . Abnormal involuntary movement 06/28/2010  . Vitamin D deficiency 03/25/2010  . Hypogonadism male 01/20/2010  . ERECTILE DYSFUNCTION 11/26/2008  . Essential hypertension, benign 11/26/2008  . [place] ANEMIA 09/24/2008  . IRON DEFIC ANEMIA [place] DIET IRON INTAKE 09/21/2008  . Atrial fibrillation ([place])  09/21/2008  . SICK SINUS SYNDROME 09/21/2008  . Asthma 09/21/2008  . ESOPHAGITIS 08/26/2008  . ESOPHAGEAL MOTILITY DISORDER 08/26/2008  . GASTRITIS, ACUTE 08/26/2008   Past Medical History:  Diagnosis Date  . Anemia   . Asthma   . Atrial fibrillation ([place])   . CHF (congestive heart failure) ([place])   . COPD (chronic obstructive pulmonary disease) ([place])   . Heart disease   . Hypertension     Family History  Problem Relation Age of Onset  . Cancer Mother     Past Surgical History:  Procedure Laterality Date  . CARDIAC CATHETERIZATION N/A 12/08/2015   Procedure: Left Heart Cath and Coronary Angiography;  Surgeon: Charolette Forward, MD;  Location: [facility];  Service: Cardiovascular;  Laterality: N/A;  . ESOPHAGUS SURGERY    . SHOULDER CLOSED REDUCTION Left 12/01/2017   Procedure: CLOSED REDUCTION SHOULDER;  Surgeon: Altamese Ste. Marie, MD;  Location: [facility];  Service: Orthopedics;  Laterality: Left;   Social History   Occupational History  . 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 Smoking status: Never Smoker  . Smokeless tobacco: Never Used  Substance and Sexual Activity  . Alcohol use: No    Alcohol/week: 0.0 oz  . Drug use: No  . Sexual activity: Not Currently

## 2018-07-14 ENCOUNTER — Ambulatory Visit
Admission: RE | Admit: 2018-07-14 | Discharge: 2018-07-14 | Disposition: A | Discharge status: Home / Self Care | Payer: Medicare HMO | Source: Ambulatory Visit | Attending: Orthopaedic Surgery | Admitting: Orthopaedic Surgery

## 2018-07-14 DIAGNOSIS — M25512 Pain in left shoulder: Principal | ICD-10-CM

## 2018-07-14 DIAGNOSIS — G8929 Other chronic pain: Secondary | ICD-10-CM

## 2018-07-19 ENCOUNTER — Encounter: Payer: Self-pay | Admitting: Gastroenterology

## 2018-07-19 ENCOUNTER — Ambulatory Visit (INDEPENDENT_AMBULATORY_CARE_PROVIDER_SITE_OTHER): Payer: Medicare HMO | Admitting: Gastroenterology

## 2018-07-19 ENCOUNTER — Other Ambulatory Visit (INDEPENDENT_AMBULATORY_CARE_PROVIDER_SITE_OTHER): Payer: Self-pay

## 2018-07-19 ENCOUNTER — Ambulatory Visit (INDEPENDENT_AMBULATORY_CARE_PROVIDER_SITE_OTHER): Payer: Medicare HMO | Admitting: Orthopaedic Surgery

## 2018-07-19 VITALS — BP 110/76 | HR 80 | Ht 65.5 in | Wt 236.4 lb

## 2018-07-19 DIAGNOSIS — G8929 Other chronic pain: Secondary | ICD-10-CM

## 2018-07-19 DIAGNOSIS — M25512 Pain in left shoulder: Secondary | ICD-10-CM

## 2018-07-19 DIAGNOSIS — Z1211 Encounter for screening for malignant neoplasm of colon: Secondary | ICD-10-CM | POA: Diagnosis not present

## 2018-07-19 DIAGNOSIS — K219 Gastro-esophageal reflux disease without esophagitis: Secondary | ICD-10-CM | POA: Diagnosis not present

## 2018-07-19 NOTE — Progress Notes (Signed)
Patient: [name] [name]           Date of Birth: Jul 25, 1951           MRN: [id number] Visit Date: 07/19/2018 PCP: Charlott Rakes, MD   Assessment & Plan:  Chief Complaint:  Chief Complaint  Patient presents with  . Left Shoulder - Follow-up   Visit Diagnoses:  1. Chronic left shoulder pain     Plan: Patient is a pleasant 67 year old gentleman who presents to our clinic today to discuss MR arthrogram results of the left shoulder.  History of traumatic dislocation back in December 2018.  He never followed up with an orthopedist at that point and never received a physical therapy.  He  was seen by [place] earlier this month.  Moderate discomfort and weakness to the left shoulder was noted.  MRI of the left shoulder discussed with the patient today reveals a large Hill-Sachs involving the posterior humeral head, anterior labral tear and moderate rotator cuff tendinopathy.  At this point, we will refer the patient to Dr. Ernestina Patches for a glenohumeral cortisone injection.  We will also start him in formal physical therapy to work on range of motion and strengthening.  He will follow-up with [place] in 7 weeks time for recheck.  Call with concerns or questions in the meantime.  Follow-Up Instructions: Return in about 7 weeks (around 09/06/2018).   Orders:  No orders of the defined types were placed in this encounter.  No orders of the defined types were placed in this encounter.   Imaging: No results found.  PMFS History: Patient Active Problem List   Diagnosis Date Noted  . Chronic left shoulder pain 07/03/2018  . Syncope 12/01/2017  . Dislocated shoulder 12/01/2017  . UTI (urinary tract infection) 12/01/2017  . Confusion 06/12/2017  . Elevated sed rate 12/26/2016  . History of gastroesophageal reflux (GERD) 12/22/2016  . History of asthma 12/22/2016  . History of COPD 12/22/2016  . Benign prostatic hyperplasia 03/21/2016  . Abnormal involuntary movements 03/08/2016  . COPD (chronic  obstructive pulmonary disease) ([place]) 12/24/2015  . Pleural effusion on left   . HCAP (healthcare-associated pneumonia) 12/15/2015  . Left lower lobe pneumonia ([place])   . Sepsis due to pneumonia ([place]) 11/27/2015  . Acute respiratory failure ([place]) 11/27/2015  . Asthma exacerbation 11/27/2015  . Anemia 11/27/2015  . CHF (congestive heart failure) ([place]) 11/27/2015  . Sepsis ([place]) 11/27/2015  . Screening for prostate cancer 03/03/2015  . Gynecomastia 03/03/2015  . Seizures ([place]) 10/09/2014  . Essential hypertension 08/12/2014  . Chest pain 01/24/2014  . Sick sinus syndrome ([place]) 03/21/2013  . TINEA CRURIS 09/30/2010  . Hypogonadism, male 09/23/2010  . GERD 09/01/2010  . DIZZINESS 09/01/2010  . CHEST PAIN UNSPECIFIED 09/01/2010  . Sleep apnea 08/20/2010  . Other fatigue 06/28/2010  . Abnormal involuntary movement 06/28/2010  . Vitamin D deficiency 03/25/2010  . Hypogonadism male 01/20/2010  . ERECTILE DYSFUNCTION 11/26/2008  . Essential hypertension, benign 11/26/2008  . [place] ANEMIA 09/24/2008  . IRON DEFIC ANEMIA [place] DIET IRON INTAKE 09/21/2008  . Atrial fibrillation ([place]) 09/21/2008  . SICK SINUS SYNDROME 09/21/2008  . Asthma 09/21/2008  . ESOPHAGITIS 08/26/2008  . ESOPHAGEAL MOTILITY DISORDER 08/26/2008  . GASTRITIS, ACUTE 08/26/2008   Past Medical History:  Diagnosis Date  . Anemia   . Asthma   . Atrial fibrillation ([place])   . CHF (congestive heart failure) ([place])   . COPD (chronic obstructive pulmonary disease) ([place])   . Heart  disease   . Hypertension     Family History  Problem Relation Age of Onset  . Cancer Mother     Past Surgical History:  Procedure Laterality Date  . CARDIAC CATHETERIZATION N/A 12/08/2015   Procedure: Left Heart Cath and Coronary Angiography;  Surgeon: Charolette Forward, MD;  Location: [facility];  Service: Cardiovascular;  Laterality: N/A;  . ESOPHAGUS SURGERY    . SHOULDER CLOSED REDUCTION Left 12/01/2017   Procedure:  CLOSED REDUCTION SHOULDER;  Surgeon: Altamese Brookings, MD;  Location: [facility];  Service: Orthopedics;  Laterality: Left;   Social History   Occupational History  . 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 Smoking status: Never Smoker  . Smokeless tobacco: Never Used  Substance and Sexual Activity  . Alcohol use: No    Alcohol/week: 0.0 oz  . Drug use: No  . Sexual activity: Not Currently

## 2018-07-19 NOTE — Progress Notes (Signed)
[facility] Progress Note  Chief Complaint: Colon cancer screening  Subjective  History:  This is a 67 year old man seen in April 2017 for weight loss that appeared related to a protracted respiratory illness.  He was recalled to discuss colon cancer screening since his last colonoscopy was reportedly in 2009, according to an office note by Dr. Sharlett Iles.  He has occasional constipation and denies rectal bleeding. He reports long-standing regurgitation without dysphagia.  Denies nausea or vomiting. ROS: Cardiovascular:  no chest pain Respiratory: no dyspnea  The patient's Past Medical, Family and Social History were reviewed and are on file in the EMR.  Objective:  Med list reviewed  Current Outpatient Medications:  .  acetaminophen (TYLENOL) 325 MG tablet, Take 2 tablets (650 mg total) by mouth every 6 (six) hours as needed for mild pain (or Fever >/= 101)., Disp: 30 tablet, Rfl: 0 .  albuterol (PROVENTIL HFA;VENTOLIN HFA) 108 (90 Base) MCG/ACT inhaler, Inhale 2 puffs into the lungs every 6 (six) hours as needed., Disp: 1 Inhaler, Rfl: 5 .  albuterol (PROVENTIL) (2.5 MG/3ML) 0.083% nebulizer solution, Take 3 mLs (2.5 mg total) by nebulization every 6 (six) hours as needed for wheezing or shortness of breath., Disp: 150 mL, Rfl: 1 .  amiodarone (PACERONE) 200 MG tablet, Take 1 tablet (200 mg total) by mouth 2 (two) times daily., Disp: 60 tablet, Rfl: 2 .  ARNUITY ELLIPTA 100 MCG/ACT AEPB, Inhale 1 puff into the lungs daily., Disp: , Rfl: 3 .  aspirin EC 81 MG tablet, Take 81 mg by mouth daily.  , Disp: , Rfl:  .  atorvastatin (LIPITOR) 40 MG tablet, Take 1 tablet (40 mg total) by mouth daily at 6 PM., Disp: 30 tablet, Rfl: 5 .  clomiPHENE (CLOMID) 50 MG tablet, Take 50 mg by mouth daily. 1/4 tab daily, Disp: , Rfl:  .  clotrimazole-betamethasone (LOTRISONE) cream, Apply 1 application topically 2 (two) times daily as needed (FOR RASH)., Disp: , Rfl:  .  digoxin (LANOXIN)  0.125 MG tablet, Take 1 tablet (0.125 mg total) by mouth daily., Disp: 30 tablet, Rfl: 3 .  ferrous sulfate 325 (65 FE) MG tablet, Take 325 mg by mouth daily with breakfast. Reported on 03/16/2016, Disp: , Rfl:  .  Fluticasone-Salmeterol (ADVAIR) 250-50 MCG/DOSE AEPB, Inhale 1 puff into the lungs 2 (two) times daily., Disp: 60 each, Rfl: 5 .  furosemide (LASIX) 20 MG tablet, TAKE 1 TABLET BY MOUTH EVERY DAY, Disp: 30 tablet, Rfl: 5 .  losartan (COZAAR) 100 MG tablet, Take 100 mg by mouth daily., Disp: , Rfl: 3 .  methocarbamol (ROBAXIN) 500 MG tablet, Take 1 tablet (500 mg total) by mouth every 8 (eight) hours as needed for muscle spasms. Prn pain, Disp: 90 tablet, Rfl: 0 .  metoprolol succinate (TOPROL-XL) 25 MG 24 hr tablet, Take 1 tablet (25 mg total) by mouth daily., Disp: 30 tablet, Rfl: 5 .  nitroGLYCERIN (NITROSTAT) 0.4 MG SL tablet, Place 1 tablet (0.4 mg total) under the tongue every 5 (five) minutes as needed for chest pain., Disp: 25 tablet, Rfl: 1 .  pantoprazole (PROTONIX) 40 MG tablet, Take 1 tablet (40 mg total) by mouth daily., Disp: 30 tablet, Rfl: 5 .  potassium chloride (K-DUR) 10 MEQ tablet, Take 10 mEq by mouth daily., Disp: , Rfl:  .  sildenafil (REVATIO) 20 MG tablet, Take 20 mg by mouth as needed (for ED). , Disp: , Rfl:  .  tamoxifen (NOLVADEX) 10 MG tablet, Take  10 mg by mouth daily., Disp: , Rfl:  .  tamsulosin (FLOMAX) 0.4 MG CAPS capsule, Take 1 capsule (0.4 mg total) by mouth daily., Disp: 30 capsule, Rfl: 5 .  traMADol (ULTRAM) 50 MG tablet, Take 1 tablet (50 mg total) by mouth every 8 (eight) hours as needed for severe pain., Disp: 20 tablet, Rfl: 0 .  UNABLE TO FIND, Outpatient PT, OT  Diagnosis: Shoulder dislocation, syncope, Disp: 1 Mutually Defined, Rfl: 0   Vital signs in last 24 hrs: Vitals:   07/19/18 1343  BP: 110/76  Pulse: 80  Weight stable for at least the last 6 months  Physical Exam  Well-appearing, normal vocal quality  HEENT: sclera anicteric,  oral mucosa moist without lesions  Neck: supple, no thyromegaly, JVD or lymphadenopathy  Cardiac: RRR without murmurs, S1S2 heard, no peripheral edema  Pulm: clear to auscultation bilaterally, normal RR and effort noted  Abdomen: soft, no tenderness, with active bowel sounds. No guarding or palpable hepatosplenomegaly.  Skin; warm and dry, no jaundice or rash  Recent Labs:  CBC Latest Ref Rng & Units 12/02/2017 12/01/2017 12/01/2017  WBC 4.0 - 10.5 K/uL 10.6(H) 9.7 13.3(H)  Hemoglobin 13.0 - 17.0 g/dL 10.8(L) 11.3(L) 11.7(L)  Hematocrit 39.0 - 52.0 % 34.8(L) 36.0(L) 37.0(L)  Platelets 150 - 400 K/uL 282 312 280     @ASSESSMENTPLANBEGIN @ Assessment: Encounter Diagnoses  Name Primary?  . Gastroesophageal reflux disease, esophagitis presence not specified Yes  . Special screening for malignant neoplasms, colon     Long-standing chronic stable GERD on PPI.  We discussed the need for diet and lifestyle measures for control of regurgitation.  Written information given.  Continue current dose PPI. Colon cancer screening discussed.  He has great concerns about having a care partner available for a colonoscopy.  I discussed a Cologuard test, explained how that works and what positive and negative results might mean.  If positive, he understands that a colonoscopy would be advised.  I also offered to give him information about a local company that can be hired for care partner, but he declines.  He ultimately opted for the Cologuard test, and understands that any positive test would require a colonoscopy that would be a diagnostic procedure.   Total time 20 minutes, over half spent face-to-face with patient in counseling and coordination of care.   Nelida Meuse III

## 2018-07-19 NOTE — Patient Instructions (Signed)
If you are age 69 or older, your body mass index should be between 23-30. Your Body mass index is 38.74 kg/m. If this is out of the aforementioned range listed, please consider follow up with your Primary Care Provider.  If you are age 84 or younger, your body mass index should be between 19-25. Your Body mass index is 38.74 kg/m. If this is out of the aformentioned range listed, please consider follow up with your Primary Care Provider.   Your provider has ordered Cologuard testing as an option for colon cancer screening. This is performed by [employer] and may be out of network with your insurance. PRIOR to completing the test, it is YOUR responsibility to contact your insurance about covered benefits for this test. Your out of pocket expense could be anywhere from $0.00 to $649.00.   When you call to check coverage with your insurer, please provide the following information:   -The ONLY provider of Cologuard is [place] code for Cologuard is [phone].  [employer] NPI # [id number]  -[employer] Tax ID # [id number]   We have already sent your demographic and insurance information to [employer] (phone number [phone]) and they should contact you within the next week regarding your test. If you have not heard from them within the next week, please call our office at [phone].  You have been given GERD Literature.  Thank you for choosing me and [facility].  Wilfrid Lund, MD

## 2018-07-29 ENCOUNTER — Telehealth (INDEPENDENT_AMBULATORY_CARE_PROVIDER_SITE_OTHER): Payer: Self-pay | Admitting: *Deleted

## 2018-07-29 ENCOUNTER — Other Ambulatory Visit: Payer: Self-pay | Admitting: Physician Assistant

## 2018-07-29 ENCOUNTER — Other Ambulatory Visit (INDEPENDENT_AMBULATORY_CARE_PROVIDER_SITE_OTHER): Payer: Self-pay | Admitting: Physical Medicine and Rehabilitation

## 2018-07-29 DIAGNOSIS — F411 Generalized anxiety disorder: Secondary | ICD-10-CM

## 2018-07-29 DIAGNOSIS — G8929 Other chronic pain: Secondary | ICD-10-CM

## 2018-07-29 DIAGNOSIS — M25512 Pain in left shoulder: Principal | ICD-10-CM

## 2018-07-29 MED ORDER — TRIAZOLAM 0.25 MG PO TABS: ORAL_TABLET | ORAL | 0 refills | Status: DC

## 2018-07-29 NOTE — Telephone Encounter (Signed)
Pt is scheduled for 08/16/18 for shoulder injection with driver.

## 2018-07-29 NOTE — Progress Notes (Signed)
Pre-procedure triazolam ordered for pre-operative anxiety.  

## 2018-07-29 NOTE — Telephone Encounter (Signed)
Called patient to advise patient needs to schedule at his convenience. States he would call back if does not find referral.

## 2018-08-13 DIAGNOSIS — D649 Anemia, unspecified: Secondary | ICD-10-CM | POA: Diagnosis not present

## 2018-08-13 DIAGNOSIS — I10 Essential (primary) hypertension: Secondary | ICD-10-CM | POA: Diagnosis not present

## 2018-08-13 DIAGNOSIS — J45909 Unspecified asthma, uncomplicated: Secondary | ICD-10-CM | POA: Diagnosis not present

## 2018-08-13 DIAGNOSIS — K219 Gastro-esophageal reflux disease without esophagitis: Secondary | ICD-10-CM | POA: Diagnosis not present

## 2018-08-13 DIAGNOSIS — I2510 Atherosclerotic heart disease of native coronary artery without angina pectoris: Secondary | ICD-10-CM | POA: Diagnosis not present

## 2018-08-13 DIAGNOSIS — E785 Hyperlipidemia, unspecified: Secondary | ICD-10-CM | POA: Diagnosis not present

## 2018-08-15 ENCOUNTER — Telehealth (INDEPENDENT_AMBULATORY_CARE_PROVIDER_SITE_OTHER): Payer: Self-pay | Admitting: Orthopaedic Surgery

## 2018-08-15 NOTE — Telephone Encounter (Signed)
Sam from [facility] called late yesterday requesting a prescription for PT be faxed to her at [phone].  Thank you.

## 2018-08-16 ENCOUNTER — Ambulatory Visit (INDEPENDENT_AMBULATORY_CARE_PROVIDER_SITE_OTHER): Payer: Self-pay | Admitting: Physical Medicine and Rehabilitation

## 2018-08-16 NOTE — Telephone Encounter (Signed)
FAXED

## 2018-08-21 DIAGNOSIS — M25312 Other instability, left shoulder: Secondary | ICD-10-CM | POA: Diagnosis not present

## 2018-08-21 DIAGNOSIS — M25512 Pain in left shoulder: Secondary | ICD-10-CM | POA: Diagnosis not present

## 2018-08-21 DIAGNOSIS — M25612 Stiffness of left shoulder, not elsewhere classified: Secondary | ICD-10-CM | POA: Diagnosis not present

## 2018-08-22 DIAGNOSIS — M25612 Stiffness of left shoulder, not elsewhere classified: Secondary | ICD-10-CM | POA: Diagnosis not present

## 2018-08-22 DIAGNOSIS — M25512 Pain in left shoulder: Secondary | ICD-10-CM | POA: Diagnosis not present

## 2018-08-22 DIAGNOSIS — M25312 Other instability, left shoulder: Secondary | ICD-10-CM | POA: Diagnosis not present

## 2018-08-27 ENCOUNTER — Ambulatory Visit: Payer: Medicare HMO | Admitting: Family Medicine

## 2018-08-28 DIAGNOSIS — M25612 Stiffness of left shoulder, not elsewhere classified: Secondary | ICD-10-CM | POA: Diagnosis not present

## 2018-08-28 DIAGNOSIS — M25312 Other instability, left shoulder: Secondary | ICD-10-CM | POA: Diagnosis not present

## 2018-08-28 DIAGNOSIS — M25512 Pain in left shoulder: Secondary | ICD-10-CM | POA: Diagnosis not present

## 2018-08-30 DIAGNOSIS — M25312 Other instability, left shoulder: Secondary | ICD-10-CM | POA: Diagnosis not present

## 2018-08-30 DIAGNOSIS — M25612 Stiffness of left shoulder, not elsewhere classified: Secondary | ICD-10-CM | POA: Diagnosis not present

## 2018-08-30 DIAGNOSIS — M25512 Pain in left shoulder: Secondary | ICD-10-CM | POA: Diagnosis not present

## 2018-08-31 ENCOUNTER — Other Ambulatory Visit: Payer: Self-pay | Admitting: Family Medicine

## 2018-08-31 DIAGNOSIS — M25512 Pain in left shoulder: Principal | ICD-10-CM

## 2018-08-31 DIAGNOSIS — G8929 Other chronic pain: Secondary | ICD-10-CM

## 2018-09-04 DIAGNOSIS — M25312 Other instability, left shoulder: Secondary | ICD-10-CM | POA: Diagnosis not present

## 2018-09-04 DIAGNOSIS — M25612 Stiffness of left shoulder, not elsewhere classified: Secondary | ICD-10-CM | POA: Diagnosis not present

## 2018-09-04 DIAGNOSIS — M25512 Pain in left shoulder: Secondary | ICD-10-CM | POA: Diagnosis not present

## 2018-09-06 ENCOUNTER — Ambulatory Visit (INDEPENDENT_AMBULATORY_CARE_PROVIDER_SITE_OTHER): Payer: Medicare HMO | Admitting: Orthopaedic Surgery

## 2018-09-06 DIAGNOSIS — M25312 Other instability, left shoulder: Secondary | ICD-10-CM | POA: Diagnosis not present

## 2018-09-06 DIAGNOSIS — M25612 Stiffness of left shoulder, not elsewhere classified: Secondary | ICD-10-CM | POA: Diagnosis not present

## 2018-09-06 DIAGNOSIS — M25512 Pain in left shoulder: Secondary | ICD-10-CM | POA: Diagnosis not present

## 2018-09-09 DIAGNOSIS — M25312 Other instability, left shoulder: Secondary | ICD-10-CM | POA: Diagnosis not present

## 2018-09-09 DIAGNOSIS — M25612 Stiffness of left shoulder, not elsewhere classified: Secondary | ICD-10-CM | POA: Diagnosis not present

## 2018-09-09 DIAGNOSIS — M25512 Pain in left shoulder: Secondary | ICD-10-CM | POA: Diagnosis not present

## 2018-09-11 ENCOUNTER — Ambulatory Visit (INDEPENDENT_AMBULATORY_CARE_PROVIDER_SITE_OTHER): Payer: Self-pay | Admitting: Physical Medicine and Rehabilitation

## 2018-09-11 DIAGNOSIS — M25612 Stiffness of left shoulder, not elsewhere classified: Secondary | ICD-10-CM | POA: Diagnosis not present

## 2018-09-11 DIAGNOSIS — M25512 Pain in left shoulder: Secondary | ICD-10-CM | POA: Diagnosis not present

## 2018-09-11 DIAGNOSIS — M25312 Other instability, left shoulder: Secondary | ICD-10-CM | POA: Diagnosis not present

## 2018-09-13 DIAGNOSIS — M25612 Stiffness of left shoulder, not elsewhere classified: Secondary | ICD-10-CM | POA: Diagnosis not present

## 2018-09-13 DIAGNOSIS — M25512 Pain in left shoulder: Secondary | ICD-10-CM | POA: Diagnosis not present

## 2018-09-13 DIAGNOSIS — M25312 Other instability, left shoulder: Secondary | ICD-10-CM | POA: Diagnosis not present

## 2018-09-16 DIAGNOSIS — M25612 Stiffness of left shoulder, not elsewhere classified: Secondary | ICD-10-CM | POA: Diagnosis not present

## 2018-09-16 DIAGNOSIS — M25312 Other instability, left shoulder: Secondary | ICD-10-CM | POA: Diagnosis not present

## 2018-09-16 DIAGNOSIS — M25512 Pain in left shoulder: Secondary | ICD-10-CM | POA: Diagnosis not present

## 2018-09-17 ENCOUNTER — Encounter: Payer: Self-pay | Admitting: Family Medicine

## 2018-09-17 ENCOUNTER — Ambulatory Visit: Payer: Medicare HMO | Attending: Family Medicine | Admitting: Family Medicine

## 2018-09-17 VITALS — BP 167/97 | HR 64 | Temp 97.9°F | Ht 65.5 in | Wt 235.4 lb

## 2018-09-17 DIAGNOSIS — R569 Unspecified convulsions: Secondary | ICD-10-CM | POA: Insufficient documentation

## 2018-09-17 DIAGNOSIS — I480 Paroxysmal atrial fibrillation: Secondary | ICD-10-CM | POA: Diagnosis not present

## 2018-09-17 DIAGNOSIS — J449 Chronic obstructive pulmonary disease, unspecified: Secondary | ICD-10-CM | POA: Diagnosis not present

## 2018-09-17 DIAGNOSIS — I495 Sick sinus syndrome: Secondary | ICD-10-CM | POA: Diagnosis not present

## 2018-09-17 DIAGNOSIS — R600 Localized edema: Secondary | ICD-10-CM | POA: Diagnosis not present

## 2018-09-17 DIAGNOSIS — I10 Essential (primary) hypertension: Secondary | ICD-10-CM | POA: Diagnosis not present

## 2018-09-17 DIAGNOSIS — M25571 Pain in right ankle and joints of right foot: Secondary | ICD-10-CM | POA: Diagnosis not present

## 2018-09-17 DIAGNOSIS — I4892 Unspecified atrial flutter: Secondary | ICD-10-CM | POA: Diagnosis not present

## 2018-09-17 DIAGNOSIS — N400 Enlarged prostate without lower urinary tract symptoms: Secondary | ICD-10-CM | POA: Insufficient documentation

## 2018-09-17 DIAGNOSIS — Z79899 Other long term (current) drug therapy: Secondary | ICD-10-CM | POA: Diagnosis not present

## 2018-09-17 DIAGNOSIS — I2510 Atherosclerotic heart disease of native coronary artery without angina pectoris: Secondary | ICD-10-CM | POA: Insufficient documentation

## 2018-09-17 DIAGNOSIS — Z7982 Long term (current) use of aspirin: Secondary | ICD-10-CM | POA: Insufficient documentation

## 2018-09-17 DIAGNOSIS — I110 Hypertensive heart disease with heart failure: Secondary | ICD-10-CM | POA: Insufficient documentation

## 2018-09-17 DIAGNOSIS — M25473 Effusion, unspecified ankle: Secondary | ICD-10-CM | POA: Diagnosis not present

## 2018-09-17 DIAGNOSIS — K219 Gastro-esophageal reflux disease without esophagitis: Secondary | ICD-10-CM | POA: Insufficient documentation

## 2018-09-17 DIAGNOSIS — I5030 Unspecified diastolic (congestive) heart failure: Secondary | ICD-10-CM | POA: Insufficient documentation

## 2018-09-17 DIAGNOSIS — E785 Hyperlipidemia, unspecified: Secondary | ICD-10-CM | POA: Diagnosis not present

## 2018-09-17 DIAGNOSIS — Z7951 Long term (current) use of inhaled steroids: Secondary | ICD-10-CM | POA: Insufficient documentation

## 2018-09-17 MED ORDER — PREDNISONE 20 MG PO TABS: 20.0000 mg | ORAL_TABLET | Freq: Every day | ORAL | 0 refills | Status: DC

## 2018-09-17 NOTE — Patient Instructions (Signed)
Edema Edema is when you have too much fluid in your body or under your skin. Edema may make your legs, feet, and ankles swell up. Swelling is also common in looser tissues, like around your eyes. This is a common condition. It gets more common as you get older. There are many possible causes of edema. Eating too much salt (sodium) and being on your feet or sitting for a long time can cause edema in your legs, feet, and ankles. Hot weather may make edema worse. Edema is usually painless. Your skin may look swollen or shiny. Follow these instructions at home:  Keep the swollen body part raised (elevated) above the level of your heart when you are sitting or lying down.  Do not sit still or stand for a long time.  Do not wear tight clothes. Do not wear garters on your upper legs.  Exercise your legs. This can help the swelling go down.  Wear elastic bandages or support stockings as told by your doctor.  Eat a low-salt (low-sodium) diet to reduce fluid as told by your doctor.  Depending on the cause of your swelling, you may need to limit how much fluid you drink (fluid restriction).  Take over-the-counter and prescription medicines only as told by your doctor. Contact a doctor if:  Treatment is not working.  You have heart, liver, or kidney disease and have symptoms of edema.  You have sudden and unexplained weight gain. Get help right away if:  You have shortness of breath or chest pain.  You cannot breathe when you lie down.  You have pain, redness, or warmth in the swollen areas.  You have heart, liver, or kidney disease and get edema all of a sudden.  You have a fever and your symptoms get worse all of a sudden. Summary  Edema is when you have too much fluid in your body or under your skin.  Edema may make your legs, feet, and ankles swell up. Swelling is also common in looser tissues, like around your eyes.  Raise (elevate) the swollen body part above the level of your  heart when you are sitting or lying down.  Follow your doctor's instructions about diet and how much fluid you can drink (fluid restriction). This information is not intended to replace advice given to you by your health care provider. Make sure you discuss any questions you have with your health care provider. Document Released: 05/29/2008 Document Revised: 12/29/2016 Document Reviewed: 12/29/2016 Elsevier Interactive Patient Education  2017 Elsevier Inc.  

## 2018-09-17 NOTE — Progress Notes (Signed)
Patient is having swelling and pain in right foot.

## 2018-09-17 NOTE — Progress Notes (Signed)
Subjective:  Patient ID: [name] [name], male    DOB: 29-Oct-1951  Age: 67 y.o. MRN: [id number]  CC: Foot Pain   HPI HARU SHAFF  is a 67 year old male with a history of paroxysmal A. fib, atrial flutter, diastolic congestive heart failure (EF 55-60% in 11/2017), CAD (status post cardiac cath in 11/2015 - mid LAD 20% stenosis, dist LAD 40% stenosis, Ost RCA to prox RCA 30% stenosis, normal LV systolic function), sick sinus syndrome, COPD, GERD, seizures (managed by neurology), BPH, hypogonadism (managed by endocrine) here for an acute visit complaining of right ankle swelling which started this morning. He has also had a discomfort in the medial aspect of his right ankle which turns to pain when he applies pressure on it and is rated as a 5/10 at this time but goes up to a 7/10 with weightbearing.  He has no history of gout. 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 He has not noticed any worsening shortness of breath but does have some dyspnea on moderate exertion at baseline.  Past Medical History:  Diagnosis Date  . Anemia   . Asthma   . Atrial fibrillation ([place])   . CHF (congestive heart failure) ([place])   . COPD (chronic obstructive pulmonary disease) ([place])   . Heart disease   . Hypertension   . Syncope     Past Surgical History:  Procedure Laterality Date  . CARDIAC CATHETERIZATION N/A 12/08/2015   Procedure: Left Heart Cath and Coronary Angiography;  Surgeon: Charolette Forward, MD;  Location: [facility];  Service: Cardiovascular;  Laterality: N/A;  . ESOPHAGUS SURGERY    . SHOULDER CLOSED REDUCTION Left 12/01/2017   Procedure: CLOSED REDUCTION SHOULDER;  Surgeon: Altamese Maynard, MD;  Location: [facility];  Service: Orthopedics;  Laterality: Left;    No Known Allergies   Outpatient Medications Prior to Visit  Medication Sig Dispense Refill  . acetaminophen (TYLENOL) 325 MG tablet Take 2  tablets (650 mg total) by mouth every 6 (six) hours as needed for mild pain (or Fever >/= 101). 30 tablet 0  . albuterol (PROVENTIL HFA;VENTOLIN HFA) 108 (90 Base) MCG/ACT inhaler Inhale 2 puffs into the lungs every 6 (six) hours as needed. 1 Inhaler 5  . albuterol (PROVENTIL) (2.5 MG/3ML) 0.083% nebulizer solution Take 3 mLs (2.5 mg total) by nebulization every 6 (six) hours as needed for wheezing or shortness of breath. 150 mL 1  . amiodarone (PACERONE) 200 MG tablet Take 1 tablet (200 mg total) by mouth 2 (two) times daily. 60 tablet 2  . ARNUITY ELLIPTA 100 MCG/ACT AEPB Inhale 1 puff into the lungs daily.  3  . aspirin EC 81 MG tablet Take 81 mg by mouth daily.      Marland Kitchen atorvastatin (LIPITOR) 40 MG tablet Take 1 tablet (40 mg total) by mouth daily at 6 PM. 30 tablet 5  . clomiPHENE (CLOMID) 50 MG tablet Take 50 mg by mouth daily. 1/4 tab daily    . clotrimazole-betamethasone (LOTRISONE) cream Apply 1 application topically 2 (two) times daily as needed (FOR RASH).    Marland Kitchen digoxin (LANOXIN) 0.125 MG tablet Take 1 tablet (0.125 mg total) by mouth daily. 30 tablet 3  . ferrous sulfate 325 (65 FE) MG tablet Take 325 mg by mouth daily with breakfast. Reported on 03/16/2016    . Fluticasone-Salmeterol (ADVAIR) 250-50 MCG/DOSE AEPB Inhale 1 puff into the  lungs 2 (two) times daily. 60 each 5  . furosemide (LASIX) 20 MG tablet TAKE 1 TABLET BY MOUTH EVERY DAY 30 tablet 5  . losartan (COZAAR) 100 MG tablet Take 100 mg by mouth daily.  3  . methocarbamol (ROBAXIN) 500 MG tablet TAKE 1 TABLET (500 MG TOTAL) BY MOUTH EVERY 8 (EIGHT) HOURS AS NEEDED FOR MUSCLE SPASM/PAIN 90 tablet 0  . metoprolol succinate (TOPROL-XL) 25 MG 24 hr tablet Take 1 tablet (25 mg total) by mouth daily. 30 tablet 5  . nitroGLYCERIN (NITROSTAT) 0.4 MG SL tablet Place 1 tablet (0.4 mg total) under the tongue every 5 (five) minutes as needed for chest pain. 25 tablet 1  . pantoprazole (PROTONIX) 40 MG tablet Take 1 tablet (40 mg total) by  mouth daily. 30 tablet 5  . potassium chloride (K-DUR) 10 MEQ tablet Take 10 mEq by mouth daily.    . sildenafil (REVATIO) 20 MG tablet Take 20 mg by mouth as needed (for ED).     Marland Kitchen tamoxifen (NOLVADEX) 10 MG tablet Take 10 mg by mouth daily.    . tamsulosin (FLOMAX) 0.4 MG CAPS capsule Take 1 capsule (0.4 mg total) by mouth daily. 30 capsule 5  . traMADol (ULTRAM) 50 MG tablet Take 1 tablet (50 mg total) by mouth every 8 (eight) hours as needed for severe pain. 20 tablet 0  . triazolam (HALCION) 0.25 MG tablet Take 1 tab by mouth 1 hour prior to procedure, do not drive motor vehicle. 2 tablet 0  . UNABLE TO FIND Outpatient PT, OT  Diagnosis: Shoulder dislocation, syncope 1 Mutually Defined 0   No facility-administered medications prior to visit.     ROS Review of Systems  Constitutional: Negative for activity change and appetite change.  HENT: Negative for sinus pressure and sore throat.   Eyes: Negative for visual disturbance.  Respiratory: Positive for shortness of breath (which is at baseline). Negative for cough and chest tightness.   Cardiovascular: Negative for chest pain and leg swelling.  Gastrointestinal: Negative for abdominal distention, abdominal pain, constipation and diarrhea.  Endocrine: Negative.   Genitourinary: Negative for dysuria.  Musculoskeletal:       See hpi  Skin: Negative for rash.  Allergic/Immunologic: Negative.   Neurological: Negative for weakness, light-headedness and numbness.  Psychiatric/Behavioral: Negative for dysphoric mood and suicidal ideas.    Objective:  BP (!) 167/97   Pulse 64   Temp 97.9 F (36.6 C) (Oral)   Ht 5' 5.5" (1.664 m)   Wt 235 lb 6.4 oz (106.8 kg)   SpO2 100%   BMI 38.58 kg/m   BP/Weight 09/17/2018 07/19/2018 0/97/3532  Systolic BP 992 426 834  Diastolic BP 97 76 73  Wt. (Lbs) 235.4 236.38 243  BMI 38.58 38.74 38.06      Physical Exam  Constitutional: He is oriented to person, place, and time. He appears  well-developed and well-nourished.  Cardiovascular: Normal rate, normal heart sounds and intact distal pulses.  No murmur heard. Pulmonary/Chest: Effort normal and breath sounds normal. He has no wheezes. He has no rales. He exhibits no tenderness.  Abdominal: Soft. Bowel sounds are normal. He exhibits no distension and no mass. There is no tenderness.  Musculoskeletal:  2+ nonpitting edema of right ankle, 1+ nonpitting edema left ankle Tenderness to palpation of medial aspect of right ankle  Neurological: He is alert and oriented to person, place, and time.     Assessment & Plan:   1. Acute right ankle pain We  will need to screen for gout - Uric Acid  2. Ankle edema - predniSONE (DELTASONE) 20 MG tablet; Take 1 tablet (20 mg total) by mouth daily with breakfast.  Dispense: 5 tablet; Refill: 0   Meds ordered this encounter  Medications  . predniSONE (DELTASONE) 20 MG tablet    Sig: Take 1 tablet (20 mg total) by mouth daily with breakfast.    Dispense:  5 tablet    Refill:  0    Follow-up: Return for Follow up of chronic medical conditions,  keep previously  scheduled appointment.   Charlott Rakes MD

## 2018-09-18 ENCOUNTER — Other Ambulatory Visit: Payer: Self-pay | Admitting: Family Medicine

## 2018-09-18 DIAGNOSIS — M10071 Idiopathic gout, right ankle and foot: Secondary | ICD-10-CM

## 2018-09-18 DIAGNOSIS — M109 Gout, unspecified: Secondary | ICD-10-CM | POA: Insufficient documentation

## 2018-09-18 LAB — URIC ACID: Uric Acid: 9.5 mg/dL — ABNORMAL HIGH (ref 3.7–8.6)

## 2018-09-18 MED ORDER — ALLOPURINOL 300 MG PO TABS: 300.0000 mg | ORAL_TABLET | Freq: Every day | ORAL | 6 refills | Status: DC

## 2018-09-20 ENCOUNTER — Telehealth: Payer: Self-pay

## 2018-09-20 DIAGNOSIS — M25612 Stiffness of left shoulder, not elsewhere classified: Secondary | ICD-10-CM | POA: Diagnosis not present

## 2018-09-20 DIAGNOSIS — M25312 Other instability, left shoulder: Secondary | ICD-10-CM | POA: Diagnosis not present

## 2018-09-20 DIAGNOSIS — M25512 Pain in left shoulder: Secondary | ICD-10-CM | POA: Diagnosis not present

## 2018-09-20 NOTE — Telephone Encounter (Signed)
Patient returned phone call and was informed of lab results. 

## 2018-09-20 NOTE — Telephone Encounter (Signed)
-----   Message from Charlott Rakes, MD sent at 09/18/2018  1:07 PM EDT ----- Blood work is in keeping with gout which is a condition where uric acid crystals deposited in certain joints causing pain and inflammation.  Avoiding red meat, seafood, wine and other foods that trigger gout will help limit flares.  I have sent a prescription for allopurinol to his pharmacy for prevention of gout flares and will discuss with him for that at his upcoming visit.

## 2018-09-20 NOTE — Telephone Encounter (Signed)
Patient was called and informed to contact office for lab results. 

## 2018-09-23 DIAGNOSIS — M25512 Pain in left shoulder: Secondary | ICD-10-CM | POA: Diagnosis not present

## 2018-09-23 DIAGNOSIS — M25612 Stiffness of left shoulder, not elsewhere classified: Secondary | ICD-10-CM | POA: Diagnosis not present

## 2018-09-23 DIAGNOSIS — M25312 Other instability, left shoulder: Secondary | ICD-10-CM | POA: Diagnosis not present

## 2018-09-25 DIAGNOSIS — M25512 Pain in left shoulder: Secondary | ICD-10-CM | POA: Diagnosis not present

## 2018-09-25 DIAGNOSIS — M25312 Other instability, left shoulder: Secondary | ICD-10-CM | POA: Diagnosis not present

## 2018-09-25 DIAGNOSIS — M25612 Stiffness of left shoulder, not elsewhere classified: Secondary | ICD-10-CM | POA: Diagnosis not present

## 2018-09-27 ENCOUNTER — Encounter (INDEPENDENT_AMBULATORY_CARE_PROVIDER_SITE_OTHER): Payer: Self-pay | Admitting: Orthopaedic Surgery

## 2018-09-27 ENCOUNTER — Ambulatory Visit (INDEPENDENT_AMBULATORY_CARE_PROVIDER_SITE_OTHER): Payer: Medicare HMO | Admitting: Orthopaedic Surgery

## 2018-09-27 VITALS — Ht 65.0 in | Wt 235.4 lb

## 2018-09-27 DIAGNOSIS — G8929 Other chronic pain: Secondary | ICD-10-CM

## 2018-09-27 DIAGNOSIS — M25512 Pain in left shoulder: Secondary | ICD-10-CM | POA: Diagnosis not present

## 2018-09-27 NOTE — Progress Notes (Signed)
Patient: [name] [name]           Date of Birth: Sep 15, 1951           MRN: [id number] Visit Date: 09/27/2018 PCP: Charlott Rakes, MD   Assessment & Plan:  Chief Complaint:  Chief Complaint  Patient presents with  . Left Shoulder - Follow-up   Visit Diagnoses:  1. Chronic left shoulder pain     Plan: [name] [name] presents today for shoulder pain.  He has been receiving physical therapy and overall he is doing well.  He does state that he is benefiting from this.  His range of motion is improving.  His strength is also improving.  He does not want a cortisone injection at this point which I think is totally fine.  He will continue to work with home exercises and physical therapy.  Follow-up as needed.  Follow-Up Instructions: Return if symptoms worsen or fail to improve.   Orders:  No orders of the defined types were placed in this encounter.  No orders of the defined types were placed in this encounter.   Imaging: No results found.  PMFS History: Patient Active Problem List   Diagnosis Date Noted  . Gout 09/18/2018  . Chronic left shoulder pain 07/03/2018  . Syncope 12/01/2017  . Dislocated shoulder 12/01/2017  . UTI (urinary tract infection) 12/01/2017  . Confusion 06/12/2017  . Elevated sed rate 12/26/2016  . History of gastroesophageal reflux (GERD) 12/22/2016  . History of asthma 12/22/2016  . History of COPD 12/22/2016  . Benign prostatic hyperplasia 03/21/2016  . Abnormal involuntary movements 03/08/2016  . COPD (chronic obstructive pulmonary disease) ([place]) 12/24/2015  . Pleural effusion on left   . HCAP (healthcare-associated pneumonia) 12/15/2015  . Left lower lobe pneumonia ([place])   . Sepsis due to pneumonia ([place]) 11/27/2015  . Acute respiratory failure ([place]) 11/27/2015  . Asthma exacerbation 11/27/2015  . Anemia 11/27/2015  . CHF (congestive heart failure) ([place]) 11/27/2015  . Sepsis ([place]) 11/27/2015  . Screening for prostate cancer 03/03/2015  .  Gynecomastia 03/03/2015  . Seizures ([place]) 10/09/2014  . Essential hypertension 08/12/2014  . Chest pain 01/24/2014  . Sick sinus syndrome ([place]) 03/21/2013  . TINEA CRURIS 09/30/2010  . Hypogonadism, male 09/23/2010  . GERD 09/01/2010  . DIZZINESS 09/01/2010  . CHEST PAIN UNSPECIFIED 09/01/2010  . Sleep apnea 08/20/2010  . Other fatigue 06/28/2010  . Abnormal involuntary movement 06/28/2010  . Vitamin D deficiency 03/25/2010  . Hypogonadism male 01/20/2010  . ERECTILE DYSFUNCTION 11/26/2008  . Essential hypertension, benign 11/26/2008  . [place] ANEMIA 09/24/2008  . IRON DEFIC ANEMIA [place] DIET IRON INTAKE 09/21/2008  . Atrial fibrillation ([place]) 09/21/2008  . SICK SINUS SYNDROME 09/21/2008  . Asthma 09/21/2008  . ESOPHAGITIS 08/26/2008  . ESOPHAGEAL MOTILITY DISORDER 08/26/2008  . GASTRITIS, ACUTE 08/26/2008   Past Medical History:  Diagnosis Date  . Anemia   . Asthma   . Atrial fibrillation ([place])   . CHF (congestive heart failure) ([place])   . COPD (chronic obstructive pulmonary disease) ([place])   . Heart disease   . Hypertension   . Syncope     Family History  Problem Relation Age of Onset  . Cancer Mother     Past Surgical History:  Procedure Laterality Date  . CARDIAC CATHETERIZATION N/A 12/08/2015   Procedure: Left Heart Cath and Coronary Angiography;  Surgeon: Charolette Forward, MD;  Location: [facility];  Service: Cardiovascular;  Laterality: N/A;  . ESOPHAGUS SURGERY    .  SHOULDER CLOSED REDUCTION Left 12/01/2017   Procedure: CLOSED REDUCTION SHOULDER;  Surgeon: Altamese Avalon, MD;  Location: [facility];  Service: Orthopedics;  Laterality: Left;   Social History   Occupational History  . 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 Smoking status: Never Smoker  . Smokeless tobacco: Never Used  Substance and Sexual Activity  . Alcohol use: No    Alcohol/week: 0.0 standard drinks  . Drug use: No  . Sexual activity: Not  Currently

## 2018-09-30 ENCOUNTER — Other Ambulatory Visit: Payer: Self-pay | Admitting: Family Medicine

## 2018-09-30 DIAGNOSIS — M25512 Pain in left shoulder: Principal | ICD-10-CM

## 2018-09-30 DIAGNOSIS — G8929 Other chronic pain: Secondary | ICD-10-CM

## 2018-09-30 DIAGNOSIS — M25612 Stiffness of left shoulder, not elsewhere classified: Secondary | ICD-10-CM | POA: Diagnosis not present

## 2018-09-30 DIAGNOSIS — M25312 Other instability, left shoulder: Secondary | ICD-10-CM | POA: Diagnosis not present

## 2018-10-04 DIAGNOSIS — M25512 Pain in left shoulder: Secondary | ICD-10-CM | POA: Diagnosis not present

## 2018-10-04 DIAGNOSIS — M25312 Other instability, left shoulder: Secondary | ICD-10-CM | POA: Diagnosis not present

## 2018-10-04 DIAGNOSIS — M25612 Stiffness of left shoulder, not elsewhere classified: Secondary | ICD-10-CM | POA: Diagnosis not present

## 2018-10-07 DIAGNOSIS — M25512 Pain in left shoulder: Secondary | ICD-10-CM | POA: Diagnosis not present

## 2018-10-07 DIAGNOSIS — M25612 Stiffness of left shoulder, not elsewhere classified: Secondary | ICD-10-CM | POA: Diagnosis not present

## 2018-10-07 DIAGNOSIS — M25312 Other instability, left shoulder: Secondary | ICD-10-CM | POA: Diagnosis not present

## 2018-10-10 DIAGNOSIS — M25512 Pain in left shoulder: Secondary | ICD-10-CM | POA: Diagnosis not present

## 2018-10-10 DIAGNOSIS — M25312 Other instability, left shoulder: Secondary | ICD-10-CM | POA: Diagnosis not present

## 2018-10-10 DIAGNOSIS — M25612 Stiffness of left shoulder, not elsewhere classified: Secondary | ICD-10-CM | POA: Diagnosis not present

## 2018-10-12 ENCOUNTER — Other Ambulatory Visit: Payer: Self-pay | Admitting: Internal Medicine

## 2018-10-15 DIAGNOSIS — M25512 Pain in left shoulder: Secondary | ICD-10-CM | POA: Diagnosis not present

## 2018-10-15 DIAGNOSIS — M25312 Other instability, left shoulder: Secondary | ICD-10-CM | POA: Diagnosis not present

## 2018-10-15 DIAGNOSIS — M25612 Stiffness of left shoulder, not elsewhere classified: Secondary | ICD-10-CM | POA: Diagnosis not present

## 2018-10-17 ENCOUNTER — Encounter

## 2018-10-17 ENCOUNTER — Ambulatory Visit: Payer: Medicare HMO | Attending: Family Medicine | Admitting: Family Medicine

## 2018-10-17 ENCOUNTER — Encounter: Payer: Self-pay | Admitting: Family Medicine

## 2018-10-17 VITALS — BP 138/83 | HR 61 | Temp 97.8°F | Ht 65.0 in | Wt 240.0 lb

## 2018-10-17 DIAGNOSIS — R569 Unspecified convulsions: Secondary | ICD-10-CM | POA: Diagnosis not present

## 2018-10-17 DIAGNOSIS — M109 Gout, unspecified: Secondary | ICD-10-CM | POA: Diagnosis not present

## 2018-10-17 DIAGNOSIS — K219 Gastro-esophageal reflux disease without esophagitis: Secondary | ICD-10-CM | POA: Diagnosis not present

## 2018-10-17 DIAGNOSIS — I110 Hypertensive heart disease with heart failure: Secondary | ICD-10-CM | POA: Insufficient documentation

## 2018-10-17 DIAGNOSIS — J449 Chronic obstructive pulmonary disease, unspecified: Secondary | ICD-10-CM | POA: Insufficient documentation

## 2018-10-17 DIAGNOSIS — J438 Other emphysema: Secondary | ICD-10-CM | POA: Diagnosis not present

## 2018-10-17 DIAGNOSIS — I10 Essential (primary) hypertension: Secondary | ICD-10-CM | POA: Diagnosis not present

## 2018-10-17 DIAGNOSIS — Z79899 Other long term (current) drug therapy: Secondary | ICD-10-CM | POA: Insufficient documentation

## 2018-10-17 DIAGNOSIS — I5032 Chronic diastolic (congestive) heart failure: Secondary | ICD-10-CM | POA: Diagnosis not present

## 2018-10-17 DIAGNOSIS — I2510 Atherosclerotic heart disease of native coronary artery without angina pectoris: Secondary | ICD-10-CM | POA: Insufficient documentation

## 2018-10-17 DIAGNOSIS — M25312 Other instability, left shoulder: Secondary | ICD-10-CM | POA: Diagnosis not present

## 2018-10-17 DIAGNOSIS — I4892 Unspecified atrial flutter: Secondary | ICD-10-CM | POA: Insufficient documentation

## 2018-10-17 DIAGNOSIS — Z7982 Long term (current) use of aspirin: Secondary | ICD-10-CM | POA: Insufficient documentation

## 2018-10-17 DIAGNOSIS — I495 Sick sinus syndrome: Secondary | ICD-10-CM | POA: Insufficient documentation

## 2018-10-17 DIAGNOSIS — R399 Unspecified symptoms and signs involving the genitourinary system: Secondary | ICD-10-CM | POA: Diagnosis not present

## 2018-10-17 DIAGNOSIS — M25512 Pain in left shoulder: Secondary | ICD-10-CM | POA: Diagnosis not present

## 2018-10-17 DIAGNOSIS — N400 Enlarged prostate without lower urinary tract symptoms: Secondary | ICD-10-CM | POA: Insufficient documentation

## 2018-10-17 DIAGNOSIS — I480 Paroxysmal atrial fibrillation: Secondary | ICD-10-CM | POA: Diagnosis not present

## 2018-10-17 DIAGNOSIS — M25612 Stiffness of left shoulder, not elsewhere classified: Secondary | ICD-10-CM | POA: Diagnosis not present

## 2018-10-17 MED ORDER — PANTOPRAZOLE SODIUM 40 MG PO TBEC: 40.0000 mg | DELAYED_RELEASE_TABLET | Freq: Every day | ORAL | 5 refills | Status: DC

## 2018-10-17 MED ORDER — TAMSULOSIN HCL 0.4 MG PO CAPS: 0.4000 mg | ORAL_CAPSULE | Freq: Every day | ORAL | 5 refills | Status: DC

## 2018-10-17 MED ORDER — METOPROLOL SUCCINATE ER 25 MG PO TB24: 25.0000 mg | ORAL_TABLET | Freq: Every day | ORAL | 5 refills | Status: DC

## 2018-10-17 MED ORDER — FUROSEMIDE 20 MG PO TABS: ORAL_TABLET | ORAL | 5 refills | Status: DC

## 2018-10-17 MED ORDER — ATORVASTATIN CALCIUM 40 MG PO TABS: 40.0000 mg | ORAL_TABLET | Freq: Every day | ORAL | 5 refills | Status: DC

## 2018-10-17 NOTE — Patient Instructions (Signed)

## 2018-10-17 NOTE — Progress Notes (Signed)
Subjective:  Patient ID: [name] [name], male    DOB: 01/24/1951  Age: 67 y.o. MRN: [id number]  CC: Hypertension   HPI [name] [name]   is a 67 year old male with a history of paroxysmal A. fib, atrial flutter, diastolic congestive heart failure (EF 55-60% in 11/2017), CAD (status post cardiac cath in 11/2015 - mid LAD 20% stenosis, dist LAD 40% stenosis, Ost RCA to prox RCA 30% stenosis, normal LV systolic function), sick sinus syndrome, COPD, GERD, seizures (managed by neurology), BPH, hypogonadism (managed by endocrine), newly diagnosed Gout here for a follow up visit.  At his last visit he was diagnosed with Gout after complaints of right ankle swelling and uric acid came back elevated at 9.5 and he was commenced on Allopurinol. He reports resolution of symptoms. He denies chest pain but does have chronic dyspnea or mild exertion. He has been compliant with his inhalers and sees Pulmonary - Dr Chase Caller for his Emphysema. He has noticed more jerking of his right upper extremity over the last four days which are typical of his seizures. Currently on Keppra and was previously followed by [facility] with his last visit in 05/2017. He also endorses driving long distances four days ago which he thinks might have triggered his symptoms.  Past Medical History:  Diagnosis Date  . Anemia   . Asthma   . Atrial fibrillation ([place])   . CHF (congestive heart failure) ([place])   . COPD (chronic obstructive pulmonary disease) ([place])   . Heart disease   . Hypertension   . Syncope     Past Surgical History:  Procedure Laterality Date  . CARDIAC CATHETERIZATION N/A 12/08/2015   Procedure: Left Heart Cath and Coronary Angiography;  Surgeon: Charolette Forward, MD;  Location: [facility];  Service: Cardiovascular;  Laterality: N/A;  . ESOPHAGUS SURGERY    . SHOULDER CLOSED REDUCTION Left 12/01/2017   Procedure: CLOSED REDUCTION SHOULDER;  Surgeon: Altamese [place], MD;  Location: [facility];  Service: Orthopedics;  Laterality: Left;    No Known Allergies   Outpatient Medications Prior to Visit  Medication Sig Dispense Refill  . acetaminophen (TYLENOL) 325 MG tablet Take 2 tablets (650 mg total) by mouth every 6 (six) hours as needed for mild pain (or Fever >/= 101). 30 tablet 0  . albuterol (PROVENTIL HFA;VENTOLIN HFA) 108 (90 Base) MCG/ACT inhaler Inhale 2 puffs into the lungs every 6 (six) hours as needed. 1 Inhaler 5  . albuterol (PROVENTIL) (2.5 MG/3ML) 0.083% nebulizer solution Take 3 mLs (2.5 mg total) by nebulization every 6 (six) hours as needed for wheezing or shortness of breath. 150 mL 1  . allopurinol (ZYLOPRIM) 300 MG tablet Take 1 tablet (300 mg total) by mouth daily. 30 tablet 6  . amiodarone (PACERONE) 200 MG tablet Take 1 tablet (200 mg total) by mouth 2 (two) times daily. 60 tablet 2  . ARNUITY ELLIPTA 100 MCG/ACT AEPB Inhale 1 puff into the lungs daily.  3  . ARNUITY ELLIPTA 100 MCG/ACT AEPB TAKE 1 PUFF BY MOUTH EVERY DAY 30 each 0  . aspirin EC 81 MG tablet Take 81 mg by mouth daily.      . clomiPHENE (CLOMID) 50 MG tablet Take 50 mg by mouth daily. 1/4 tab daily    . clotrimazole-betamethasone (LOTRISONE) cream Apply 1 application topically 2 (two) times daily as needed (FOR RASH).    Marland Kitchen digoxin (LANOXIN) 0.125 MG tablet Take 1 tablet (0.125 mg total) by mouth daily. [place]  tablet 3  . ferrous sulfate 325 (65 FE) MG tablet Take 325 mg by mouth daily with breakfast. Reported on 03/16/2016    . losartan (COZAAR) 100 MG tablet Take 100 mg by mouth daily.  3  . methocarbamol (ROBAXIN) 500 MG tablet TAKE 1 TABLET (500 MG TOTAL) BY MOUTH EVERY 8 (EIGHT) HOURS AS NEEDED FOR MUSCLE SPASM/PAIN 90 tablet 0  . nitroGLYCERIN (NITROSTAT) 0.4 MG SL tablet Place 1 tablet (0.4 mg total) under the tongue every 5 (five) minutes as needed for chest pain. 25 tablet 1  . potassium chloride (K-DUR) 10 MEQ tablet Take 10 mEq by mouth daily.    . sildenafil (REVATIO) 20 MG tablet Take  20 mg by mouth as needed (for ED).     Marland Kitchen tamoxifen (NOLVADEX) 10 MG tablet Take 10 mg by mouth daily.    . traMADol (ULTRAM) 50 MG tablet Take 1 tablet (50 mg total) by mouth every 8 (eight) hours as needed for severe pain. 20 tablet 0  . triazolam (HALCION) 0.25 MG tablet Take 1 tab by mouth 1 hour prior to procedure, do not drive motor vehicle. 2 tablet 0  . atorvastatin (LIPITOR) 40 MG tablet Take 1 tablet (40 mg total) by mouth daily at 6 PM. 30 tablet 5  . Fluticasone-Salmeterol (ADVAIR) 250-50 MCG/DOSE AEPB Inhale 1 puff into the lungs 2 (two) times daily. 60 each 5  . furosemide (LASIX) 20 MG tablet TAKE 1 TABLET BY MOUTH EVERY DAY 30 tablet 5  . metoprolol succinate (TOPROL-XL) 25 MG 24 hr tablet Take 1 tablet (25 mg total) by mouth daily. 30 tablet 5  . pantoprazole (PROTONIX) 40 MG tablet Take 1 tablet (40 mg total) by mouth daily. 30 tablet 5  . predniSONE (DELTASONE) 20 MG tablet Take 1 tablet (20 mg total) by mouth daily with breakfast. 5 tablet 0  . tamsulosin (FLOMAX) 0.4 MG CAPS capsule Take 1 capsule (0.4 mg total) by mouth daily. 30 capsule 5  . UNABLE TO FIND Outpatient PT, OT  Diagnosis: Shoulder dislocation, syncope (Patient not taking: Reported on 10/17/2018) 1 Mutually Defined 0   No facility-administered medications prior to visit.     ROS Review of Systems  Constitutional: Negative for activity change and appetite change.  HENT: Negative for sinus pressure and sore throat.   Eyes: Negative for visual disturbance.  Respiratory: Positive for shortness of breath. Negative for cough and chest tightness.   Cardiovascular: Negative for chest pain and leg swelling.  Gastrointestinal: Negative for abdominal distention, abdominal pain, constipation and diarrhea.  Endocrine: Negative.   Genitourinary: Negative for dysuria.  Musculoskeletal: Negative for joint swelling and myalgias.  Skin: Negative for rash.  Allergic/Immunologic: Negative.   Neurological: Negative for  weakness, light-headedness and numbness.  Psychiatric/Behavioral: Negative for dysphoric mood and suicidal ideas.    Objective:  BP 138/83   Pulse 61   Temp 97.8 F (36.6 C) (Oral)   Ht 5\' 5"  (1.651 m)   Wt 240 lb (108.9 kg)   SpO2 100%   BMI 39.94 kg/m   BP/Weight 10/17/2018 09/27/2018 7/32/2025  Systolic BP 427 - 062  Diastolic BP 83 - 97  Wt. (Lbs) 240 235.4 235.4  BMI 39.94 39.17 38.58      Physical Exam  Constitutional: He is oriented to person, place, and time. He appears well-developed and well-nourished.  Cardiovascular: Normal rate, normal heart sounds and intact distal pulses.  No murmur heard. Pulmonary/Chest: Effort normal and breath sounds normal. He has no wheezes.  He has no rales. He exhibits no tenderness.  Abdominal: Soft. Bowel sounds are normal. He exhibits no distension and no mass. There is no tenderness.  Musculoskeletal: Normal range of motion.  Neurological: He is alert and oriented to person, place, and time.  Skin: Skin is warm and dry.  Psychiatric: He has a normal mood and affect.     Assessment & Plan:   1. Chronic diastolic congestive heart failure (HCC) EF 55-60% from echo of 11/2017 Low sodium diet, daily weights Continue medications - atorvastatin (LIPITOR) 40 MG tablet; Take 1 tablet (40 mg total) by mouth daily at 6 PM.  Dispense: 30 tablet; Refill: 5 - furosemide (LASIX) 20 MG tablet; TAKE 1 TABLET BY MOUTH EVERY DAY  Dispense: 30 tablet; Refill: 5  2. Essential hypertension, benign Controlled Counseled on blood pressure goal of less than 130/80, low-sodium, DASH diet, medication compliance, 150 minutes of moderate intensity exercise per week. Discussed medication compliance, adverse effects. - metoprolol succinate (TOPROL-XL) 25 MG 24 hr tablet; Take 1 tablet (25 mg total) by mouth daily.  Dispense: 30 tablet; Refill: 5 - Basic Metabolic Panel  3. Lower urinary tract symptoms Stable - tamsulosin (FLOMAX) 0.4 MG CAPS capsule;  Take 1 capsule (0.4 mg total) by mouth daily.  Dispense: 30 capsule; Refill: 5  4. Other emphysema ([place]) Still dyspneic which is his baseline Oxygen saturation is normal Continue Arnuity Ellipta and Proventil Follow up with Pulmonary  5. Seizures ([place]) States he has been compliant with Keppra even though it is absent from his med list Advised to make appointment with his Neurologist given concern for partial seizures  6. Gastroesophageal reflux disease without esophagitis Controlled - pantoprazole (PROTONIX) 40 MG tablet; Take 1 tablet (40 mg total) by mouth daily.  Dispense: 30 tablet; Refill: 5   Meds ordered this encounter  Medications  . atorvastatin (LIPITOR) 40 MG tablet    Sig: Take 1 tablet (40 mg total) by mouth daily at 6 PM.    Dispense:  30 tablet    Refill:  5  . furosemide (LASIX) 20 MG tablet    Sig: TAKE 1 TABLET BY MOUTH EVERY DAY    Dispense:  30 tablet    Refill:  5  . metoprolol succinate (TOPROL-XL) 25 MG 24 hr tablet    Sig: Take 1 tablet (25 mg total) by mouth daily.    Dispense:  30 tablet    Refill:  5  . pantoprazole (PROTONIX) 40 MG tablet    Sig: Take 1 tablet (40 mg total) by mouth daily.    Dispense:  30 tablet    Refill:  5  . tamsulosin (FLOMAX) 0.4 MG CAPS capsule    Sig: Take 1 capsule (0.4 mg total) by mouth daily.    Dispense:  30 capsule    Refill:  5    Follow-up: Return in about 3 months (around 01/17/2019) for follow up of chronic medical conditions.   Charlott Rakes MD

## 2018-10-23 DIAGNOSIS — M25312 Other instability, left shoulder: Secondary | ICD-10-CM | POA: Diagnosis not present

## 2018-10-23 DIAGNOSIS — M25612 Stiffness of left shoulder, not elsewhere classified: Secondary | ICD-10-CM | POA: Diagnosis not present

## 2018-10-23 DIAGNOSIS — M25512 Pain in left shoulder: Secondary | ICD-10-CM | POA: Diagnosis not present

## 2018-10-24 DIAGNOSIS — M25312 Other instability, left shoulder: Secondary | ICD-10-CM | POA: Diagnosis not present

## 2018-10-24 DIAGNOSIS — M25612 Stiffness of left shoulder, not elsewhere classified: Secondary | ICD-10-CM | POA: Diagnosis not present

## 2018-10-24 DIAGNOSIS — M25512 Pain in left shoulder: Secondary | ICD-10-CM | POA: Diagnosis not present

## 2018-10-29 ENCOUNTER — Other Ambulatory Visit: Payer: Self-pay | Admitting: Family Medicine

## 2018-10-29 DIAGNOSIS — M25512 Pain in left shoulder: Principal | ICD-10-CM

## 2018-10-29 DIAGNOSIS — G8929 Other chronic pain: Secondary | ICD-10-CM

## 2018-10-29 DIAGNOSIS — M25312 Other instability, left shoulder: Secondary | ICD-10-CM | POA: Diagnosis not present

## 2018-10-29 DIAGNOSIS — M25612 Stiffness of left shoulder, not elsewhere classified: Secondary | ICD-10-CM | POA: Diagnosis not present

## 2018-10-31 DIAGNOSIS — M25612 Stiffness of left shoulder, not elsewhere classified: Secondary | ICD-10-CM | POA: Diagnosis not present

## 2018-10-31 DIAGNOSIS — M25312 Other instability, left shoulder: Secondary | ICD-10-CM | POA: Diagnosis not present

## 2018-10-31 DIAGNOSIS — M25512 Pain in left shoulder: Secondary | ICD-10-CM | POA: Diagnosis not present

## 2018-11-05 DIAGNOSIS — M25612 Stiffness of left shoulder, not elsewhere classified: Secondary | ICD-10-CM | POA: Diagnosis not present

## 2018-11-05 DIAGNOSIS — M25312 Other instability, left shoulder: Secondary | ICD-10-CM | POA: Diagnosis not present

## 2018-11-05 DIAGNOSIS — M25512 Pain in left shoulder: Secondary | ICD-10-CM | POA: Diagnosis not present

## 2018-11-07 DIAGNOSIS — M25612 Stiffness of left shoulder, not elsewhere classified: Secondary | ICD-10-CM | POA: Diagnosis not present

## 2018-11-07 DIAGNOSIS — M25512 Pain in left shoulder: Secondary | ICD-10-CM | POA: Diagnosis not present

## 2018-11-07 DIAGNOSIS — M25312 Other instability, left shoulder: Secondary | ICD-10-CM | POA: Diagnosis not present

## 2018-11-12 DIAGNOSIS — M25612 Stiffness of left shoulder, not elsewhere classified: Secondary | ICD-10-CM | POA: Diagnosis not present

## 2018-11-12 DIAGNOSIS — M25512 Pain in left shoulder: Secondary | ICD-10-CM | POA: Diagnosis not present

## 2018-11-12 DIAGNOSIS — M25312 Other instability, left shoulder: Secondary | ICD-10-CM | POA: Diagnosis not present

## 2018-11-13 ENCOUNTER — Encounter: Payer: Self-pay | Admitting: Endocrinology

## 2018-11-13 ENCOUNTER — Ambulatory Visit (INDEPENDENT_AMBULATORY_CARE_PROVIDER_SITE_OTHER): Payer: Medicare HMO | Admitting: Endocrinology

## 2018-11-13 VITALS — BP 132/80 | HR 56 | Ht 65.0 in | Wt 237.6 lb

## 2018-11-13 DIAGNOSIS — Z125 Encounter for screening for malignant neoplasm of prostate: Secondary | ICD-10-CM

## 2018-11-13 DIAGNOSIS — N62 Hypertrophy of breast: Secondary | ICD-10-CM

## 2018-11-13 LAB — PSA: PSA: 0.59 ng/mL (ref 0.10–4.00)

## 2018-11-13 MED ORDER — CLOMIPHENE CITRATE 50 MG PO TABS: ORAL_TABLET | ORAL | Status: DC

## 2018-11-13 NOTE — Progress Notes (Signed)
Subjective:    Patient ID: [name] [name], male    DOB: [birth year], 67 y.o.   MRN: [id number]  HPI The state of at least three ongoing medical problems is addressed today, with interval history of each noted here: Pt returns for f/u of idiopathic central hypogonadism (he has 1 biological child; he has never taken illicit androgens; he denies any h/o infertility; he was seen at [facility], where he was dx'ed with hypogonadotropic hypogonadism; however, pt says he wishes to f/u here in [place] instead; he was rx'ed with clomid starting in early 2015).  He takes clomid as rx'ed.  He reports decreased libido.  This is a stable problem.   ED sxs persist: he gets some help from from viagra.  This is a stable problem. Gynecomastia: tamoxifen was rx'ed in early 2017; he declines surgery.  Since then, he says breast swelling is unchanged.  This is a stable problem. Past Medical History:  Diagnosis Date  . Anemia   . Asthma   . Atrial fibrillation ([place])   . CHF (congestive heart failure) ([place])   . COPD (chronic obstructive pulmonary disease) ([place])   . Heart disease   . Hypertension   . Syncope     Past Surgical History:  Procedure Laterality Date  . CARDIAC CATHETERIZATION N/A 12/08/2015   Procedure: Left Heart Cath and Coronary Angiography;  Surgeon: Charolette Forward, MD;  Location: [facility];  Service: Cardiovascular;  Laterality: N/A;  . ESOPHAGUS SURGERY    . SHOULDER CLOSED REDUCTION Left 12/01/2017   Procedure: CLOSED REDUCTION SHOULDER;  Surgeon: Altamese Winters, MD;  Location: [facility];  Service: Orthopedics;  Laterality: Left;    Social History   Socioeconomic History  . Marital status: Single    Spouse name: Not on file  . Number of children: 1  . Years of education: college  . Highest education level: Not on file  Occupational History  . 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    Employer: [employer]  Social Needs  . Financial resource strain: Not on file  . Food insecurity:   Worry: Not on file    Inability: Not on file  . Transportation needs:    Medical: Not on file    Non-medical: Not on file  Tobacco Use  . Smoking status: Never Smoker  . Smokeless tobacco: Never Used  Substance and Sexual Activity  . Alcohol use: No    Alcohol/week: 0.0 standard drinks  . Drug use: No  . Sexual activity: Not Currently  Lifestyle  . Physical activity:    Days per week: Not on file    Minutes per session: Not on file  . Stress: Not on file  Relationships  . Social connections:    Talks on phone: Not on file    Gets together: Not on file    Attends religious service: Not on file    Active member of club or organization: Not on file    Attends meetings of clubs or organizations: Not on file    Relationship status: Not on file  . Intimate partner violence:    Fear of current or ex partner: Not on file    Emotionally abused: Not on file    Physically abused: Not on file    Forced sexual activity: Not on file  Other Topics Concern  . Not on file  Social History Narrative   Patient lives at home alone and he is single.   Patient is semi retired.  Education college.   Right handed.   Caffeine two cokes daily.    Current Outpatient Medications on File Prior to Visit  Medication Sig Dispense Refill  . acetaminophen (TYLENOL) 325 MG tablet Take 2 tablets (650 mg total) by mouth every 6 (six) hours as needed for mild pain (or Fever >/= 101). 30 tablet 0  . albuterol (PROVENTIL HFA;VENTOLIN HFA) 108 (90 Base) MCG/ACT inhaler Inhale 2 puffs into the lungs every 6 (six) hours as needed. 1 Inhaler 5  . albuterol (PROVENTIL) (2.5 MG/3ML) 0.083% nebulizer solution Take 3 mLs (2.5 mg total) by nebulization every 6 (six) hours as needed for wheezing or shortness of breath. 150 mL 1  . allopurinol (ZYLOPRIM) 300 MG tablet Take 1 tablet (300 mg total) by mouth daily. 30 tablet 6  . amiodarone (PACERONE) 200 MG tablet Take 1 tablet (200 mg total) by mouth 2 (two) times  daily. 60 tablet 2  . ARNUITY ELLIPTA 100 MCG/ACT AEPB Inhale 1 puff into the lungs daily.  3  . ARNUITY ELLIPTA 100 MCG/ACT AEPB TAKE 1 PUFF BY MOUTH EVERY DAY 30 each 0  . aspirin EC 81 MG tablet Take 81 mg by mouth daily.      Marland Kitchen atorvastatin (LIPITOR) 40 MG tablet Take 1 tablet (40 mg total) by mouth daily at 6 PM. 30 tablet 5  . clotrimazole-betamethasone (LOTRISONE) cream Apply 1 application topically 2 (two) times daily as needed (FOR RASH).    Marland Kitchen digoxin (LANOXIN) 0.125 MG tablet Take 1 tablet (0.125 mg total) by mouth daily. 30 tablet 3  . ferrous sulfate 325 (65 FE) MG tablet Take 325 mg by mouth daily with breakfast. Reported on 03/16/2016    . furosemide (LASIX) 20 MG tablet TAKE 1 TABLET BY MOUTH EVERY DAY 30 tablet 5  . losartan (COZAAR) 100 MG tablet Take 100 mg by mouth daily.  3  . methocarbamol (ROBAXIN) 500 MG tablet TAKE 1 TABLET (500 MG TOTAL) BY MOUTH EVERY 8 (EIGHT) HOURS AS NEEDED FOR MUSCLE SPASM/PAIN 90 tablet 1  . metoprolol succinate (TOPROL-XL) 25 MG 24 hr tablet Take 1 tablet (25 mg total) by mouth daily. 30 tablet 5  . nitroGLYCERIN (NITROSTAT) 0.4 MG SL tablet Place 1 tablet (0.4 mg total) under the tongue every 5 (five) minutes as needed for chest pain. 25 tablet 1  . pantoprazole (PROTONIX) 40 MG tablet Take 1 tablet (40 mg total) by mouth daily. 30 tablet 5  . potassium chloride (K-DUR) 10 MEQ tablet Take 10 mEq by mouth daily.    . sildenafil (REVATIO) 20 MG tablet Take 20 mg by mouth as needed (for ED).     Marland Kitchen tamoxifen (NOLVADEX) 10 MG tablet Take 10 mg by mouth daily.    . tamsulosin (FLOMAX) 0.4 MG CAPS capsule Take 1 capsule (0.4 mg total) by mouth daily. 30 capsule 5  . traMADol (ULTRAM) 50 MG tablet Take 1 tablet (50 mg total) by mouth every 8 (eight) hours as needed for severe pain. 20 tablet 0  . triazolam (HALCION) 0.25 MG tablet Take 1 tab by mouth 1 hour prior to procedure, do not drive motor vehicle. 2 tablet 0  . UNABLE TO FIND Outpatient PT,  OT  Diagnosis: Shoulder dislocation, syncope 1 Mutually Defined 0   No current facility-administered medications on file prior to visit.     No Known Allergies  Family History  Problem Relation Age of Onset  . Cancer Mother     BP 132/80 (BP Location: Left  Arm, Patient Position: Sitting, Cuff Size: Large)   Pulse (!) 56   Ht 5\' 5"  (1.651 m)   Wt 237 lb 9.6 oz (107.8 kg)   SpO2 93%   BMI 39.54 kg/m    Review of Systems Denies decreased urinary stream and leg swelling.  He says sleep apnea is well-controlled    Objective:   Physical Exam VITAL SIGNS:  See vs page GENERAL: no distress BREASTS: pseudogynecomastia is again noted Ext: 1+ bilat leg edema  Lab Results  Component Value Date   TSH 1.580 05/23/2018   Lab Results  Component Value Date   PSA 0.70 05/22/2017   PSA 0.83 02/22/2016   PSA 0.45 03/03/2015       Assessment & Plan:  idiopathic central hypogonadism:  Recheck today ED: we discussed. He chooses to continue same rx. Gynecomastia: well-controlled  Patient Instructions  Please continue the same medications.   blood tests are requested for you today.  We'll let you know about the results.   normalization of testosterone is not known to harm you.  however, there are "theoretical" risks, including increased fertility, hair loss, prostate cancer, benign prostate enlargement, blood clots, liver problems, lower hdl ("good cholesterol"), polycythemia (opposite of anemia), sleep apnea, and behavior changes.   Weight loss helps theses conditions also.  Please come back for a follow-up appointment in 1 year.

## 2018-11-13 NOTE — Patient Instructions (Addendum)
Please continue the same medications.   blood tests are requested for you today.  We'll let you know about the results.   normalization of testosterone is not known to harm you.  however, there are "theoretical" risks, including increased fertility, hair loss, prostate cancer, benign prostate enlargement, blood clots, liver problems, lower hdl ("good cholesterol"), polycythemia (opposite of anemia), sleep apnea, and behavior changes.   Weight loss helps theses conditions also.  Please come back for a follow-up appointment in 1 year.

## 2018-11-14 ENCOUNTER — Ambulatory Visit: Payer: Medicare HMO | Admitting: Endocrinology

## 2018-11-14 DIAGNOSIS — M25612 Stiffness of left shoulder, not elsewhere classified: Secondary | ICD-10-CM | POA: Diagnosis not present

## 2018-11-14 DIAGNOSIS — M25512 Pain in left shoulder: Secondary | ICD-10-CM | POA: Diagnosis not present

## 2018-11-14 DIAGNOSIS — M25312 Other instability, left shoulder: Secondary | ICD-10-CM | POA: Diagnosis not present

## 2018-11-15 LAB — TESTOSTERONE,FREE AND TOTAL
TESTOSTERONE: 187 ng/dL — AB (ref 264–916)
Testosterone, Free: 7.5 pg/mL (ref 6.6–18.1)

## 2018-11-17 DIAGNOSIS — M25612 Stiffness of left shoulder, not elsewhere classified: Secondary | ICD-10-CM | POA: Diagnosis not present

## 2018-11-17 DIAGNOSIS — M25312 Other instability, left shoulder: Secondary | ICD-10-CM | POA: Diagnosis not present

## 2018-11-17 DIAGNOSIS — M25512 Pain in left shoulder: Secondary | ICD-10-CM | POA: Diagnosis not present

## 2018-11-18 ENCOUNTER — Telehealth: Payer: Self-pay | Admitting: Endocrinology

## 2018-11-18 DIAGNOSIS — E785 Hyperlipidemia, unspecified: Secondary | ICD-10-CM | POA: Diagnosis not present

## 2018-11-18 DIAGNOSIS — I10 Essential (primary) hypertension: Secondary | ICD-10-CM | POA: Diagnosis not present

## 2018-11-18 DIAGNOSIS — I2510 Atherosclerotic heart disease of native coronary artery without angina pectoris: Secondary | ICD-10-CM | POA: Diagnosis not present

## 2018-11-18 DIAGNOSIS — K219 Gastro-esophageal reflux disease without esophagitis: Secondary | ICD-10-CM | POA: Diagnosis not present

## 2018-11-18 DIAGNOSIS — J45909 Unspecified asthma, uncomplicated: Secondary | ICD-10-CM | POA: Diagnosis not present

## 2018-11-18 DIAGNOSIS — D649 Anemia, unspecified: Secondary | ICD-10-CM | POA: Diagnosis not present

## 2018-11-18 NOTE — Telephone Encounter (Signed)
This encounter has been closed as duplicate. Addressed in a previously opened encounter.

## 2018-11-18 NOTE — Telephone Encounter (Signed)
Patient is returning Ammie's call, please advise thanks

## 2018-11-19 DIAGNOSIS — M25512 Pain in left shoulder: Secondary | ICD-10-CM | POA: Diagnosis not present

## 2018-11-19 DIAGNOSIS — M25612 Stiffness of left shoulder, not elsewhere classified: Secondary | ICD-10-CM | POA: Diagnosis not present

## 2018-11-19 DIAGNOSIS — M25312 Other instability, left shoulder: Secondary | ICD-10-CM | POA: Diagnosis not present

## 2018-11-20 ENCOUNTER — Other Ambulatory Visit: Payer: Self-pay | Admitting: Internal Medicine

## 2018-11-20 LAB — ESTRADIOL, FREE
Estradiol, Free: 0.6 pg/mL — ABNORMAL HIGH
Estradiol: 31 pg/mL — ABNORMAL HIGH

## 2018-11-20 MED ORDER — ARNUITY ELLIPTA 100 MCG/ACT IN AEPB: 1.0000 | INHALATION_SPRAY | Freq: Every day | RESPIRATORY_TRACT | 3 refills | Status: AC

## 2018-11-22 ENCOUNTER — Ambulatory Visit: Payer: Medicare HMO | Admitting: Endocrinology

## 2018-11-27 DIAGNOSIS — M25312 Other instability, left shoulder: Secondary | ICD-10-CM | POA: Diagnosis not present

## 2018-11-27 DIAGNOSIS — M25612 Stiffness of left shoulder, not elsewhere classified: Secondary | ICD-10-CM | POA: Diagnosis not present

## 2018-11-27 DIAGNOSIS — M25512 Pain in left shoulder: Secondary | ICD-10-CM | POA: Diagnosis not present

## 2018-11-28 DIAGNOSIS — M25612 Stiffness of left shoulder, not elsewhere classified: Secondary | ICD-10-CM | POA: Diagnosis not present

## 2018-11-28 DIAGNOSIS — M25312 Other instability, left shoulder: Secondary | ICD-10-CM | POA: Diagnosis not present

## 2018-11-28 DIAGNOSIS — M25512 Pain in left shoulder: Secondary | ICD-10-CM | POA: Diagnosis not present

## 2018-12-03 DIAGNOSIS — M25512 Pain in left shoulder: Secondary | ICD-10-CM | POA: Diagnosis not present

## 2018-12-03 DIAGNOSIS — M25312 Other instability, left shoulder: Secondary | ICD-10-CM | POA: Diagnosis not present

## 2018-12-03 DIAGNOSIS — M25612 Stiffness of left shoulder, not elsewhere classified: Secondary | ICD-10-CM | POA: Diagnosis not present

## 2018-12-06 DIAGNOSIS — M25612 Stiffness of left shoulder, not elsewhere classified: Secondary | ICD-10-CM | POA: Diagnosis not present

## 2018-12-06 DIAGNOSIS — M25312 Other instability, left shoulder: Secondary | ICD-10-CM | POA: Diagnosis not present

## 2018-12-06 DIAGNOSIS — M25512 Pain in left shoulder: Secondary | ICD-10-CM | POA: Diagnosis not present

## 2018-12-10 DIAGNOSIS — M25612 Stiffness of left shoulder, not elsewhere classified: Secondary | ICD-10-CM | POA: Diagnosis not present

## 2018-12-10 DIAGNOSIS — M25512 Pain in left shoulder: Secondary | ICD-10-CM | POA: Diagnosis not present

## 2018-12-10 DIAGNOSIS — M25312 Other instability, left shoulder: Secondary | ICD-10-CM | POA: Diagnosis not present

## 2018-12-12 DIAGNOSIS — M25612 Stiffness of left shoulder, not elsewhere classified: Secondary | ICD-10-CM | POA: Diagnosis not present

## 2018-12-12 DIAGNOSIS — M25512 Pain in left shoulder: Secondary | ICD-10-CM | POA: Diagnosis not present

## 2018-12-12 DIAGNOSIS — M25312 Other instability, left shoulder: Secondary | ICD-10-CM | POA: Diagnosis not present

## 2018-12-15 DIAGNOSIS — M25312 Other instability, left shoulder: Secondary | ICD-10-CM | POA: Diagnosis not present

## 2018-12-15 DIAGNOSIS — M25612 Stiffness of left shoulder, not elsewhere classified: Secondary | ICD-10-CM | POA: Diagnosis not present

## 2018-12-15 DIAGNOSIS — M25512 Pain in left shoulder: Secondary | ICD-10-CM | POA: Diagnosis not present

## 2018-12-17 DIAGNOSIS — M25512 Pain in left shoulder: Secondary | ICD-10-CM | POA: Diagnosis not present

## 2018-12-17 DIAGNOSIS — M25312 Other instability, left shoulder: Secondary | ICD-10-CM | POA: Diagnosis not present

## 2018-12-17 DIAGNOSIS — M25612 Stiffness of left shoulder, not elsewhere classified: Secondary | ICD-10-CM | POA: Diagnosis not present

## 2018-12-26 DIAGNOSIS — M25312 Other instability, left shoulder: Secondary | ICD-10-CM | POA: Diagnosis not present

## 2018-12-26 DIAGNOSIS — M25512 Pain in left shoulder: Secondary | ICD-10-CM | POA: Diagnosis not present

## 2018-12-26 DIAGNOSIS — M25612 Stiffness of left shoulder, not elsewhere classified: Secondary | ICD-10-CM | POA: Diagnosis not present

## 2018-12-31 DIAGNOSIS — M25512 Pain in left shoulder: Secondary | ICD-10-CM | POA: Diagnosis not present

## 2018-12-31 DIAGNOSIS — M25312 Other instability, left shoulder: Secondary | ICD-10-CM | POA: Diagnosis not present

## 2018-12-31 DIAGNOSIS — M25612 Stiffness of left shoulder, not elsewhere classified: Secondary | ICD-10-CM | POA: Diagnosis not present

## 2019-01-03 DIAGNOSIS — M25512 Pain in left shoulder: Secondary | ICD-10-CM | POA: Diagnosis not present

## 2019-01-03 DIAGNOSIS — M25312 Other instability, left shoulder: Secondary | ICD-10-CM | POA: Diagnosis not present

## 2019-01-03 DIAGNOSIS — M25612 Stiffness of left shoulder, not elsewhere classified: Secondary | ICD-10-CM | POA: Diagnosis not present

## 2019-01-13 DIAGNOSIS — L304 Erythema intertrigo: Secondary | ICD-10-CM | POA: Diagnosis not present

## 2019-01-20 ENCOUNTER — Encounter (HOSPITAL_COMMUNITY): Payer: Self-pay | Admitting: Emergency Medicine

## 2019-01-20 ENCOUNTER — Other Ambulatory Visit: Payer: Self-pay

## 2019-01-20 ENCOUNTER — Emergency Department (HOSPITAL_COMMUNITY): Payer: Medicare HMO

## 2019-01-20 ENCOUNTER — Emergency Department (HOSPITAL_COMMUNITY)
Admission: EM | Admit: 2019-01-20 | Discharge: 2019-01-20 | Disposition: A | Discharge status: Home / Self Care | Payer: Medicare HMO | Attending: Emergency Medicine | Admitting: Emergency Medicine

## 2019-01-20 DIAGNOSIS — Z79899 Other long term (current) drug therapy: Secondary | ICD-10-CM | POA: Insufficient documentation

## 2019-01-20 DIAGNOSIS — R42 Dizziness and giddiness: Secondary | ICD-10-CM | POA: Insufficient documentation

## 2019-01-20 DIAGNOSIS — R001 Bradycardia, unspecified: Secondary | ICD-10-CM | POA: Diagnosis not present

## 2019-01-20 DIAGNOSIS — R0602 Shortness of breath: Secondary | ICD-10-CM | POA: Diagnosis not present

## 2019-01-20 DIAGNOSIS — J45909 Unspecified asthma, uncomplicated: Secondary | ICD-10-CM | POA: Insufficient documentation

## 2019-01-20 DIAGNOSIS — I10 Essential (primary) hypertension: Secondary | ICD-10-CM | POA: Diagnosis not present

## 2019-01-20 LAB — CBC
HEMATOCRIT: 37.3 % — AB (ref 39.0–52.0)
Hemoglobin: 11 g/dL — ABNORMAL LOW (ref 13.0–17.0)
MCH: 21.1 pg — ABNORMAL LOW (ref 26.0–34.0)
MCHC: 29.5 g/dL — ABNORMAL LOW (ref 30.0–36.0)
MCV: 71.5 fL — ABNORMAL LOW (ref 80.0–100.0)
Platelets: 348 10*3/uL (ref 150–400)
RBC: 5.22 MIL/uL (ref 4.22–5.81)
RDW: 15.9 % — AB (ref 11.5–15.5)
WBC: 8.3 10*3/uL (ref 4.0–10.5)
nRBC: 0 % (ref 0.0–0.2)

## 2019-01-20 LAB — BASIC METABOLIC PANEL
Anion gap: 11 (ref 5–15)
BUN: 11 mg/dL (ref 8–23)
CO2: 26 mmol/L (ref 22–32)
Calcium: 8.7 mg/dL — ABNORMAL LOW (ref 8.9–10.3)
Chloride: 102 mmol/L (ref 98–111)
Creatinine, Ser: 1.34 mg/dL — ABNORMAL HIGH (ref 0.61–1.24)
GFR calc Af Amer: >60 mL/min (ref >60)
GFR calc non Af Amer: 54 mL/min — ABNORMAL LOW (ref >60)
Glucose, Bld: 112 mg/dL — ABNORMAL HIGH (ref 70–99)
POTASSIUM: 3.8 mmol/L (ref 3.5–5.1)
SODIUM: 139 mmol/L (ref 135–145)

## 2019-01-20 LAB — I-STAT TROPONIN, ED: Troponin i, poc: 0 ng/mL (ref 0.00–0.08)

## 2019-01-20 MED ORDER — SODIUM CHLORIDE 0.9% FLUSH: 3.0000 mL | Freq: Once | INTRAVENOUS | Status: DC

## 2019-01-20 MED ORDER — MECLIZINE HCL 25 MG PO TABS: 25.0000 mg | ORAL_TABLET | Freq: Three times a day (TID) | ORAL | 0 refills | Status: DC | PRN

## 2019-01-20 NOTE — Discharge Instructions (Addendum)
If symptoms of dizziness return, take Meclizine as prescribed. If you have severe or worsening symptoms of dizziness or lightheadedness, weakness, severe pain or new concern, return to the emergency department for further evaluation. Otherwise, please call to see your doctor in 3 days for recheck.

## 2019-01-20 NOTE — ED Triage Notes (Signed)
Woke up 1 hour ago and felt fine.  Started feeling dizzy and SOB after being up.  Lung sounds clear.  Dizziness increases when standing.  Denies chest pain.  No neuro deficits noted on triage exam. EMS states they noticed foul smelling urine.

## 2019-01-20 NOTE — ED Provider Notes (Signed)
[facility] EMERGENCY DEPARTMENT Provider Note   CSN: [id number] Arrival date & time: 01/20/19  0321     History   Chief Complaint Chief Complaint  Patient presents with  . Dizziness  . Shortness of Breath    HPI [name] [name] is a 68 y.o. male.  Patient to ED for evaluation of symptoms that started as dizziness. He woke this morning and felt his surroundings spinning. He got up to the bathroom and felt like he walked normally  The history is provided by the patient. No language interpreter was used.  Dizziness  Associated symptoms: no chest pain, no headaches, no nausea, no shortness of breath and no vomiting   Shortness of Breath  Associated symptoms: no abdominal pain, no chest pain, no fever, no headaches and no vomiting     Past Medical History:  Diagnosis Date  . Anemia   . Asthma   . Atrial fibrillation ([place])   . CHF (congestive heart failure) ([facility])   . COPD (chronic obstructive pulmonary disease) ([place])   . Heart disease   . Hypertension   . Syncope     Patient Active Problem List   Diagnosis Date Noted  . Gout 09/18/2018  . Chronic left shoulder pain 07/03/2018  . Syncope 12/01/2017  . Dislocated shoulder 12/01/2017  . UTI (urinary tract infection) 12/01/2017  . Confusion 06/12/2017  . Elevated sed rate 12/26/2016  . History of gastroesophageal reflux (GERD) 12/22/2016  . History of asthma 12/22/2016  . History of COPD 12/22/2016  . Benign prostatic hyperplasia 03/21/2016  . Abnormal involuntary movements 03/08/2016  . COPD (chronic obstructive pulmonary disease) ([place]) 12/24/2015  . Pleural effusion on left   . HCAP (healthcare-associated pneumonia) 12/15/2015  . Left lower lobe pneumonia ([place])   . Sepsis due to pneumonia ([place]) 11/27/2015  . Acute respiratory failure ([place]) 11/27/2015  . Asthma exacerbation 11/27/2015  . Anemia 11/27/2015  . CHF (congestive heart failure) ([place]) 11/27/2015  . Sepsis ([place]) 11/27/2015  .  Screening for prostate cancer 03/03/2015  . Gynecomastia 03/03/2015  . Seizures ([place]) 10/09/2014  . Essential hypertension 08/12/2014  . Chest pain 01/24/2014  . Sick sinus syndrome ([place]) 03/21/2013  . TINEA CRURIS 09/30/2010  . Hypogonadism, male 09/23/2010  . GERD 09/01/2010  . DIZZINESS 09/01/2010  . CHEST PAIN UNSPECIFIED 09/01/2010  . Sleep apnea 08/20/2010  . Other fatigue 06/28/2010  . Abnormal involuntary movement 06/28/2010  . Vitamin D deficiency 03/25/2010  . Hypogonadism male 01/20/2010  . ERECTILE DYSFUNCTION 11/26/2008  . Essential hypertension, benign 11/26/2008  . [place] ANEMIA 09/24/2008  . IRON DEFIC ANEMIA [place] DIET IRON INTAKE 09/21/2008  . Atrial fibrillation ([place]) 09/21/2008  . SICK SINUS SYNDROME 09/21/2008  . Asthma 09/21/2008  . ESOPHAGITIS 08/26/2008  . ESOPHAGEAL MOTILITY DISORDER 08/26/2008  . GASTRITIS, ACUTE 08/26/2008    Past Surgical History:  Procedure Laterality Date  . CARDIAC CATHETERIZATION N/A 12/08/2015   Procedure: Left Heart Cath and Coronary Angiography;  Surgeon: Charolette Forward, MD;  Location: [facility];  Service: Cardiovascular;  Laterality: N/A;  . ESOPHAGUS SURGERY    . SHOULDER CLOSED REDUCTION Left 12/01/2017   Procedure: CLOSED REDUCTION SHOULDER;  Surgeon: Altamese , MD;  Location: [facility];  Service: Orthopedics;  Laterality: Left;        Home Medications    Prior to Admission medications   Medication Sig Start Date End Date Taking? Authorizing Provider  acetaminophen (TYLENOL) 325 MG tablet Take 2 tablets (650 mg total) by  mouth every 6 (six) hours as needed for mild pain (or Fever >/= 101). 12/03/17   Rai, Ripudeep K, MD  albuterol (PROVENTIL HFA;VENTOLIN HFA) 108 (90 Base) MCG/ACT inhaler Inhale 2 puffs into the lungs every 6 (six) hours as needed. 11/19/17   Charlott Rakes, MD  albuterol (PROVENTIL) (2.5 MG/3ML) 0.083% nebulizer solution Take 3 mLs (2.5 mg total) by nebulization every 6 (six)  hours as needed for wheezing or shortness of breath. 08/17/17   Charlott Rakes, MD  allopurinol (ZYLOPRIM) 300 MG tablet Take 1 tablet (300 mg total) by mouth daily. 09/18/18   Charlott Rakes, MD  amiodarone (PACERONE) 200 MG tablet Take 1 tablet (200 mg total) by mouth 2 (two) times daily. 12/03/17   Rai, Ripudeep K, MD  ARNUITY ELLIPTA 100 MCG/ACT AEPB TAKE 1 PUFF BY MOUTH EVERY DAY 10/14/18   Brand Males, MD  ARNUITY ELLIPTA 100 MCG/ACT AEPB Inhale 1 puff into the lungs daily. 11/20/18   Brand Males, MD  aspirin EC 81 MG tablet Take 81 mg by mouth daily.      [provider]  atorvastatin (LIPITOR) 40 MG tablet Take 1 tablet (40 mg total) by mouth daily at 6 PM. 10/17/18   Charlott Rakes, MD  clomiPHENE (CLOMID) 50 MG tablet 1/4 tab daily 11/13/18   Renato Shin, MD  clotrimazole-betamethasone (LOTRISONE) cream Apply 1 application topically 2 (two) times daily as needed (FOR RASH). 12/03/17   Rai, Vernelle Emerald, MD  digoxin (LANOXIN) 0.125 MG tablet Take 1 tablet (0.125 mg total) by mouth daily. 12/03/17   Rai, Vernelle Emerald, MD  ferrous sulfate 325 (65 FE) MG tablet Take 325 mg by mouth daily with breakfast. Reported on 03/16/2016    [provider]  furosemide (LASIX) 20 MG tablet TAKE 1 TABLET BY MOUTH EVERY DAY 10/17/18   Charlott Rakes, MD  losartan (COZAAR) 100 MG tablet Take 100 mg by mouth daily. 06/03/18   [provider]  methocarbamol (ROBAXIN) 500 MG tablet TAKE 1 TABLET (500 MG TOTAL) BY MOUTH EVERY 8 (EIGHT) HOURS AS NEEDED FOR MUSCLE SPASM/PAIN 10/29/18   Charlott Rakes, MD  metoprolol succinate (TOPROL-XL) 25 MG 24 hr tablet Take 1 tablet (25 mg total) by mouth daily. 10/17/18   Charlott Rakes, MD  nitroGLYCERIN (NITROSTAT) 0.4 MG SL tablet Place 1 tablet (0.4 mg total) under the tongue every 5 (five) minutes as needed for chest pain. 01/25/14   Dixie Dials, MD  pantoprazole (PROTONIX) 40 MG tablet Take 1 tablet (40 mg total) by mouth daily.  10/17/18   Charlott Rakes, MD  potassium chloride (K-DUR) 10 MEQ tablet Take 10 mEq by mouth daily.    [provider]  sildenafil (REVATIO) 20 MG tablet Take 20 mg by mouth as needed (for ED).     [provider]  tamoxifen (NOLVADEX) 10 MG tablet Take 10 mg by mouth daily.    [provider]  tamsulosin (FLOMAX) 0.4 MG CAPS capsule Take 1 capsule (0.4 mg total) by mouth daily. 10/17/18   Charlott Rakes, MD  triazolam (HALCION) 0.25 MG tablet Take 1 tab by mouth 1 hour prior to procedure, do not drive motor vehicle. 07/29/18   Magnus Sinning, MD  UNABLE TO FIND Outpatient PT, OT  Diagnosis: Shoulder dislocation, syncope 12/03/17   Mendel Corning, MD    Family History Family History  Problem Relation Age of Onset  . Cancer Mother     Social History Social History   Tobacco Use  . Smoking status:  Never Smoker  . Smokeless tobacco: Never Used  Substance Use Topics  . Alcohol use: No    Alcohol/week: 0.0 standard drinks  . Drug use: No     Allergies   Patient has no known allergies.   Review of Systems Review of Systems  Constitutional: Negative for chills and fever.  HENT: Negative.   Respiratory: Negative.  Negative for shortness of breath.   Cardiovascular: Negative.  Negative for chest pain.  Gastrointestinal: Negative.  Negative for abdominal pain, nausea and vomiting.  Musculoskeletal: Negative.   Skin: Negative.   Neurological: Positive for dizziness and light-headedness. Negative for headaches.     Physical Exam Updated Vital Signs BP 137/79 (BP Location: Right Arm)   Pulse (!) 59   Temp 97.6 F (36.4 C) (Oral)   Resp 17   Ht 5\' 7"  (1.702 m)   Wt 106.6 kg   SpO2 96%   BMI 36.81 kg/m   Physical Exam Vitals signs and nursing note reviewed.  Constitutional:      Appearance: He is well-developed.  HENT:     Head: Normocephalic.  Neck:     Musculoskeletal: Normal range of motion and neck supple.  Cardiovascular:      Rate and Rhythm: Normal rate and regular rhythm.  Pulmonary:     Effort: Pulmonary effort is normal.     Breath sounds: Normal breath sounds.  Abdominal:     General: Bowel sounds are normal.     Palpations: Abdomen is soft.     Tenderness: There is no abdominal tenderness. There is no guarding or rebound.  Musculoskeletal: Normal range of motion.  Skin:    General: Skin is warm and dry.     Findings: No rash.  Neurological:     General: No focal deficit present.     Mental Status: He is alert and oriented to person, place, and time.     Comments: CN's 3-12 grossly intact. Speech is clear and focused. No facial asymmetry. No lateralizing weakness. No nystagmus. Reflexes are equal. No deficits of coordination. Ambulatory without imbalance.        ED Treatments / Results  Labs (all labs ordered are listed, but only abnormal results are displayed) Labs Reviewed  BASIC METABOLIC PANEL - Abnormal; Notable for the following components:      Result Value   Glucose, Bld 112 (*)    Creatinine, Ser 1.34 (*)    Calcium 8.7 (*)    GFR calc non Af Amer 54 (*)    All other components within normal limits  CBC - Abnormal; Notable for the following components:   Hemoglobin 11.0 (*)    HCT 37.3 (*)    MCV 71.5 (*)    MCH 21.1 (*)    MCHC 29.5 (*)    RDW 15.9 (*)    All other components within normal limits  URINALYSIS, ROUTINE W REFLEX MICROSCOPIC  I-STAT TROPONIN, ED   Results for orders placed or performed during the hospital encounter of 76/73/41  Basic metabolic panel  Result Value Ref Range   Sodium 139 135 - 145 mmol/L   Potassium 3.8 3.5 - 5.1 mmol/L   Chloride 102 98 - 111 mmol/L   CO2 26 22 - 32 mmol/L   Glucose, Bld 112 (H) 70 - 99 mg/dL   BUN 11 8 - 23 mg/dL   Creatinine, Ser 1.34 (H) 0.61 - 1.24 mg/dL   Calcium 8.7 (L) 8.9 - 10.3 mg/dL   GFR calc non Af Wyvonnia Lora  54 (L) >60 mL/min   GFR calc Af Amer >60 >60 mL/min   Anion gap 11 5 - 15  CBC  Result Value Ref Range    WBC 8.3 4.0 - 10.5 K/uL   RBC 5.22 4.22 - 5.81 MIL/uL   Hemoglobin 11.0 (L) 13.0 - 17.0 g/dL   HCT 37.3 (L) 39.0 - 52.0 %   MCV 71.5 (L) 80.0 - 100.0 fL   MCH 21.1 (L) 26.0 - 34.0 pg   MCHC 29.5 (L) 30.0 - 36.0 g/dL   RDW 15.9 (H) 11.5 - 15.5 %   Platelets 348 150 - 400 K/uL   nRBC 0.0 0.0 - 0.2 %  I-stat troponin, ED  Result Value Ref Range   Troponin i, poc 0.00 0.00 - 0.08 ng/mL   Comment 3            EKG None  Radiology Dg Chest 2 View  Result Date: 01/20/2019 CLINICAL DATA:  Shortness of breath EXAM: CHEST - 2 VIEW COMPARISON:  12/01/2017 FINDINGS: No significant pleural effusion. No focal airspace disease. Cardiomediastinal silhouette within normal limits. No pneumothorax. IMPRESSION: No active cardiopulmonary disease. Electronically Signed   By: Donavan Foil M.D.   On: 01/20/2019 03:57    Procedures Procedures (including critical care time)  Medications Ordered in ED Medications  sodium chloride flush (NS) 0.9 % injection 3 mL (has no administration in time range)     Initial Impression / Assessment and Plan / ED Course  I have reviewed the triage vital signs and the nursing notes.  Pertinent labs & imaging results that were available during my care of the patient were reviewed by me and considered in my medical decision making (see chart for details).     Patient to ED with symptoms that started as "room-spinning" dizziness upon waking that have improved to a mild lightheadedness. No nausea, pain, SOB, fall or injury.   The patient was seen and examined. He has a normal neurologic exam. Labs are essentially unremarkable. EKG without acute changes. Orthostatics were done and are normal. He states minimal symptoms of lightheadedness with standing for ortho vital signs. Symptoms felt to represent vertigo, mild, now improving.   He is seen and examined by Dr. Gilford Raid and is felt appropriate for discharge home. He is comfortable with plan of discharge. Will provide  Meclizine prescription prn if symptoms return.   Final Clinical Impressions(s) / ED Diagnoses   Final diagnoses:  None   1. Vertigo  ED Discharge Orders    None       Charlann Lange, PA-C 01/20/19 0546    Isla Pence, MD 01/20/19 [phone]

## 2019-01-20 NOTE — ED Notes (Signed)
Unable to obtain e-signature d/t equipment malfunction.  D/c instructions, follow up, medications and return precautions reviewed w/ pt.  Pt verbalized understanding.

## 2019-01-20 NOTE — ED Notes (Signed)
Patient complained of slight dizziness upon standing, saying that his "equilibrium" was better. Ambulated patient to bathroom, no complaints. Observed a slight unsteady gait.

## 2019-01-21 ENCOUNTER — Ambulatory Visit: Payer: Medicare HMO | Admitting: Family Medicine

## 2019-02-05 NOTE — Progress Notes (Signed)
Patient ID: [name] [name], male   DOB: Oct 10, 1951, 68 y.o.   MRN: [id number]      [name] [name], is a 68 y.o. male  GXQ:[id number]  XKG:[id number]  DOB - Jun 11, 1951  Subjective:  Chief Complaint and HPI: [name] [name] is a 68 y.o. male here today to for a follow up visit After being seen in the ED 01/20/2019 for vertigo.  EKG was unremarkable without ST changes.  Today he is feeling much better but still occasionally has dizziness/lightheadedness when he gets up too quickly.  He does not drink any water.  He drinks a lot of sweet tea/soft drinks.  No CP.  No SOB.  No HA/vision changes.    From ED note: Patient to ED with symptoms that started as "room-spinning" dizziness upon waking that have improved to a mild lightheadedness. No nausea, pain, SOB, fall or injury.   The patient was seen and examined. He has a normal neurologic exam. Labs are essentially unremarkable. EKG without acute changes. Orthostatics were done and are normal. He states minimal symptoms of lightheadedness with standing for ortho vital signs. Symptoms felt to represent vertigo, mild, now improving.   ED/Hospital notes reviewed.     ROS:   Constitutional:  No f/c, No night sweats, No unexplained weight loss. EENT:  No vision changes, No blurry vision, No hearing changes. No mouth, throat, or ear problems.  Respiratory: No cough, No SOB Cardiac: No CP, no palpitations GI:  No abd pain, No N/V/D. GU: No Urinary s/sx Musculoskeletal: No joint pain Neuro: No headache, much improved dizziness, no motor weakness.  Skin: No rash Endocrine:  No polydipsia. No polyuria.  Psych: Denies SI/HI  No problems updated.  ALLERGIES: No Known Allergies  PAST MEDICAL HISTORY: Past Medical History:  Diagnosis Date  . Anemia   . Asthma   . Atrial fibrillation ([place])   . CHF (congestive heart failure) ([place])   . COPD (chronic obstructive pulmonary disease) ([place])   . Heart disease   . Hypertension   . Syncope      MEDICATIONS AT HOME: Prior to Admission medications   Medication Sig Start Date End Date Taking? Authorizing Provider  acetaminophen (TYLENOL) 325 MG tablet Take 2 tablets (650 mg total) by mouth every 6 (six) hours as needed for mild pain (or Fever >/= 101). 12/03/17  Yes Rai, Ripudeep K, MD  albuterol (PROVENTIL) (2.5 MG/3ML) 0.083% nebulizer solution Take 3 mLs (2.5 mg total) by nebulization every 6 (six) hours as needed for wheezing or shortness of breath. 08/17/17  Yes Newlin, Enobong, MD  allopurinol (ZYLOPRIM) 300 MG tablet Take 1 tablet (300 mg total) by mouth daily. 09/18/18  Yes Charlott Rakes, MD  amiodarone (PACERONE) 200 MG tablet Take 1 tablet (200 mg total) by mouth 2 (two) times daily. 12/03/17  Yes Rai, Ripudeep K, MD  ARNUITY ELLIPTA 100 MCG/ACT AEPB Inhale 1 puff into the lungs daily. 11/20/18  Yes Brand Males, MD  aspirin EC 81 MG tablet Take 81 mg by mouth daily.     Yes [provider]  atorvastatin (LIPITOR) 40 MG tablet Take 1 tablet (40 mg total) by mouth daily at 6 PM. 10/17/18  Yes Newlin, Enobong, MD  digoxin (LANOXIN) 0.125 MG tablet Take 1 tablet (0.125 mg total) by mouth daily. 12/03/17  Yes Rai, Ripudeep K, MD  ferrous sulfate 325 (65 FE) MG tablet Take 325 mg by mouth daily with breakfast. Reported on 03/16/2016   Yes [provider]  furosemide (LASIX) 20  MG tablet TAKE 1 TABLET BY MOUTH EVERY DAY 10/17/18  Yes Newlin, Enobong, MD  losartan (COZAAR) 100 MG tablet Take 100 mg by mouth daily. 06/03/18  Yes [provider]  meclizine (ANTIVERT) 25 MG tablet Take 1 tablet (25 mg total) by mouth 3 (three) times daily as needed for dizziness. 01/20/19  Yes Upstill, Nehemiah Settle, PA-C  metoprolol succinate (TOPROL-XL) 25 MG 24 hr tablet Take 1 tablet (25 mg total) by mouth daily. 10/17/18  Yes Charlott Rakes, MD  nitroGLYCERIN (NITROSTAT) 0.4 MG SL tablet Place 1 tablet (0.4 mg total) under the tongue every 5 (five) minutes as needed for chest  pain. 01/25/14  Yes Dixie Dials, MD  pantoprazole (PROTONIX) 40 MG tablet Take 1 tablet (40 mg total) by mouth daily. 10/17/18  Yes Charlott Rakes, MD  potassium chloride (K-DUR) 10 MEQ tablet Take 10 mEq by mouth daily.   Yes [provider]  sildenafil (REVATIO) 20 MG tablet Take 20 mg by mouth as needed (for ED).    Yes [provider]  tamoxifen (NOLVADEX) 10 MG tablet Take 10 mg by mouth daily.   Yes [provider]  tamsulosin (FLOMAX) 0.4 MG CAPS capsule Take 1 capsule (0.4 mg total) by mouth daily. 10/17/18  Yes Newlin, Charlane Ferretti, MD  albuterol (PROVENTIL HFA;VENTOLIN HFA) 108 (90 Base) MCG/ACT inhaler Inhale 2 puffs into the lungs every 6 (six) hours as needed. Patient not taking: Reported on 02/06/2019 11/19/17   Charlott Rakes, MD  ARNUITY ELLIPTA 100 MCG/ACT AEPB TAKE 1 PUFF BY MOUTH EVERY DAY Patient not taking: Reported on 01/20/2019 10/14/18   Brand Males, MD  clomiPHENE (CLOMID) 50 MG tablet 1/4 tab daily Patient not taking: Reported on 02/06/2019 11/13/18   Renato Shin, MD  clotrimazole-betamethasone (LOTRISONE) cream Apply 1 application topically 2 (two) times daily as needed (FOR RASH). Patient not taking: Reported on 01/20/2019 12/03/17   Rai, Vernelle Emerald, MD  methocarbamol (ROBAXIN) 500 MG tablet TAKE 1 TABLET (500 MG TOTAL) BY MOUTH EVERY 8 (EIGHT) HOURS AS NEEDED FOR MUSCLE SPASM/PAIN Patient not taking: Reported on 01/20/2019 10/29/18   Charlott Rakes, MD  triazolam (HALCION) 0.25 MG tablet Take 1 tab by mouth 1 hour prior to procedure, do not drive motor vehicle. Patient not taking: Reported on 01/20/2019 07/29/18   Magnus Sinning, MD  UNABLE TO FIND Outpatient PT, OT  Diagnosis: Shoulder dislocation, syncope Patient not taking: Reported on 02/06/2019 12/03/17   Rai, Vernelle Emerald, MD     Objective:  EXAM:   Vitals:   02/06/19 1033  BP: 137/82  Pulse: 65  Temp: 98.2 F (36.8 C)  TempSrc: Oral  SpO2: 96%  Weight: 246 lb 3.2 oz (111.7  kg)  Height: 5\' 5"  (1.651 m)    General appearance : A&OX3. NAD. Non-toxic-appearing HEENT: Atraumatic and Normocephalic.  PERRLA. EOM intact.  TM clear B. Mouth-MMM, post pharynx WNL w/o erythema, No PND. Neck: supple, no JVD. No cervical lymphadenopathy. No thyromegaly Chest/Lungs:  Breathing-non-labored, Good air entry bilaterally, breath sounds normal without rales, rhonchi, or wheezing  CVS: S1 S2 regular, no murmurs, gallops, rubs  Extremities: Bilateral Lower Ext shows no edema, both legs are warm to touch with = pulse throughout Neurology:  CN II-XII grossly intact, Non focal.   Psych:  TP linear. J/I WNL. Normal speech. Appropriate eye contact and affect.  Skin:  No Rash  Data Review Lab Results  Component Value Date   HGBA1C 5.8 (H) 12/08/2015   HGBA1C 5.4 01/23/2014   HGBA1C 6.1 (  H) 03/25/2013     Assessment & Plan   1. Vertigo No red flags.   2. Dehydration Does not drink water.  Labs appeared like mild renal impairment.  Discussed at length the importance of drinking water/proper hydration.    3. Hospital discharge follow-up Much improved, stable    Patient have been counseled extensively about nutrition and exercise  Return for appt with Dr Margarita Rana at end of the month.  The patient was given clear instructions to go to ER or return to medical center if symptoms don't improve, worsen or new problems develop. The patient verbalized understanding. The patient was told to call to get lab results if they haven't heard anything in the next week.     Freeman Caldron, PA-C [facility], [place]   02/06/2019, 10:52 AM

## 2019-02-06 ENCOUNTER — Ambulatory Visit: Payer: Medicare HMO | Attending: Family Medicine | Admitting: Physician Assistant

## 2019-02-06 VITALS — BP 137/82 | HR 65 | Temp 98.2°F | Ht 65.0 in | Wt 246.2 lb

## 2019-02-06 DIAGNOSIS — E860 Dehydration: Secondary | ICD-10-CM | POA: Diagnosis not present

## 2019-02-06 DIAGNOSIS — R42 Dizziness and giddiness: Secondary | ICD-10-CM

## 2019-02-06 DIAGNOSIS — Z09 Encounter for follow-up examination after completed treatment for conditions other than malignant neoplasm: Secondary | ICD-10-CM | POA: Diagnosis not present

## 2019-02-06 DIAGNOSIS — J449 Chronic obstructive pulmonary disease, unspecified: Secondary | ICD-10-CM | POA: Diagnosis not present

## 2019-02-06 DIAGNOSIS — I509 Heart failure, unspecified: Secondary | ICD-10-CM | POA: Diagnosis not present

## 2019-02-06 NOTE — Progress Notes (Signed)
Patient stated he still feel lightheaded but not bad as the day when he went to the ER.

## 2019-02-06 NOTE — Patient Instructions (Signed)
Dizziness Dizziness is a common problem. It makes you feel unsteady or light-headed. You may feel like you are about to pass out (faint). Dizziness can lead to getting hurt if you stumble or fall. Dizziness can be caused by many things, including:  Medicines.  Not having enough water in your body (dehydration).  Illness. Follow these instructions at home: Eating and drinking   Drink enough fluid to keep your pee (urine) clear or pale yellow. This helps to keep you from getting dehydrated. Try to drink more clear fluids, such as water.  Do not drink alcohol.  Limit how much caffeine you drink or eat, if your doctor tells you to do that.  Limit how much salt (sodium) you drink or eat, if your doctor tells you to do that. Activity   Avoid making quick movements. ? When you stand up from sitting in a chair, steady yourself until you feel okay. ? In the morning, first sit up on the side of the bed. When you feel okay, stand slowly while you hold onto something. Do this until you know that your balance is fine.  If you need to stand in one place for a long time, move your legs often. Tighten and relax the muscles in your legs while you are standing.  Do not drive or use heavy machinery if you feel dizzy.  Avoid bending down if you feel dizzy. Place items in your home so you can reach them easily without leaning over. Lifestyle  Do not use any products that contain nicotine or tobacco, such as cigarettes and e-cigarettes. If you need help quitting, ask your doctor.  Try to lower your stress level. You can do this by using methods such as yoga or meditation. Talk with your doctor if you need help. General instructions  Watch your dizziness for any changes.  Take over-the-counter and prescription medicines only as told by your doctor. Talk with your doctor if you think that you are dizzy because of a medicine that you are taking.  Tell a friend or a family member that you are feeling  dizzy. If he or she notices any changes in your behavior, have this person call your doctor.  Keep all follow-up visits as told by your doctor. This is important. Contact a doctor if:  Your dizziness does not go away.  Your dizziness or light-headedness gets worse.  You feel sick to your stomach (nauseous).  You have trouble hearing.  You have new symptoms.  You are unsteady on your feet.  You feel like the room is spinning. Get help right away if:  You throw up (vomit) or have watery poop (diarrhea), and you cannot eat or drink anything.  You have trouble: ? Talking. ? Walking. ? Swallowing. ? Using your arms, hands, or legs.  You feel generally weak.  You are not thinking clearly, or you have trouble forming sentences. A friend or family member may notice this.  You have: ? Chest pain. ? Pain in your belly (abdomen). ? Shortness of breath. ? Sweating.  Your vision changes.  You are bleeding.  You have a very bad headache.  You have neck pain or a stiff neck.  You have a fever. These symptoms may be an emergency. Do not wait to see if the symptoms will go away. Get medical help right away. Call your local emergency services (911 in the [place] Do not drive yourself to the hospital. Summary  Dizziness makes you feel unsteady or light-headed. You   may feel like you are about to pass out (faint).  Drink enough fluid to keep your pee (urine) clear or pale yellow. Do not drink alcohol.  Avoid making quick movements if you feel dizzy.  Watch your dizziness for any changes. This information is not intended to replace advice given to you by your health care provider. Make sure you discuss any questions you have with your health care provider. Document Released: 11/30/2011 Document Revised: 12/28/2016 Document Reviewed: 12/28/2016 Elsevier Interactive Patient Education  2019 [employer]. Vertigo  Vertigo means that you feel like you are moving when you are not.  Vertigo can also make you feel like things around you are moving when they are not. This feeling can come and go at any time. Vertigo often goes away on its own. Follow these instructions at home:  Avoid making fast movements.  Avoid driving.  Avoid using heavy machinery.  Avoid doing any task or activity that might cause danger to you or other people if you would have a vertigo attack while you are doing it.  Sit down right away if you feel dizzy or have trouble with your balance.  Take over-the-counter and prescription medicines only as told by your doctor.  Follow instructions from your doctor about which positions or movements you should avoid.  Drink enough fluid to keep your pee (urine) clear or pale yellow.  Keep all follow-up visits as told by your doctor. This is important. Contact a doctor if:  Medicine does not help your vertigo.  You have a fever.  Your problems get worse or you have new symptoms.  Your family or friends see changes in your behavior.  You feel sick to your stomach (nauseous) or you throw up (vomit).  You have a "pins and needles" feeling or you are numb in part of your body. Get help right away if:  You have trouble moving or talking.  You are always dizzy.  You pass out (faint).  You get very bad headaches.  You feel weak or have trouble using your hands, arms, or legs.  You have changes in your hearing.  You have changes in your seeing (vision).  You get a stiff neck.  Bright light starts to bother you. This information is not intended to replace advice given to you by your health care provider. Make sure you discuss any questions you have with your health care provider. Document Released: 09/19/2008 Document Revised: 05/18/2016 Document Reviewed: 04/05/2015 Elsevier Interactive Patient Education  [employer].

## 2019-02-12 ENCOUNTER — Other Ambulatory Visit: Payer: Self-pay | Admitting: Family Medicine

## 2019-02-12 DIAGNOSIS — M25512 Pain in left shoulder: Principal | ICD-10-CM

## 2019-02-12 DIAGNOSIS — G8929 Other chronic pain: Secondary | ICD-10-CM

## 2019-02-17 DIAGNOSIS — J45909 Unspecified asthma, uncomplicated: Secondary | ICD-10-CM | POA: Diagnosis not present

## 2019-02-17 DIAGNOSIS — I10 Essential (primary) hypertension: Secondary | ICD-10-CM | POA: Diagnosis not present

## 2019-02-17 DIAGNOSIS — I2510 Atherosclerotic heart disease of native coronary artery without angina pectoris: Secondary | ICD-10-CM | POA: Diagnosis not present

## 2019-02-17 DIAGNOSIS — D649 Anemia, unspecified: Secondary | ICD-10-CM | POA: Diagnosis not present

## 2019-02-17 DIAGNOSIS — E785 Hyperlipidemia, unspecified: Secondary | ICD-10-CM | POA: Diagnosis not present

## 2019-02-17 DIAGNOSIS — K219 Gastro-esophageal reflux disease without esophagitis: Secondary | ICD-10-CM | POA: Diagnosis not present

## 2019-02-19 DIAGNOSIS — Z0100 Encounter for examination of eyes and vision without abnormal findings: Secondary | ICD-10-CM | POA: Diagnosis not present

## 2019-02-19 DIAGNOSIS — H35371 Puckering of macula, right eye: Secondary | ICD-10-CM | POA: Diagnosis not present

## 2019-02-20 ENCOUNTER — Ambulatory Visit: Payer: Medicare HMO | Admitting: Family Medicine

## 2019-03-13 DIAGNOSIS — L304 Erythema intertrigo: Secondary | ICD-10-CM | POA: Diagnosis not present

## 2019-03-19 ENCOUNTER — Other Ambulatory Visit: Payer: Self-pay | Admitting: Family Medicine

## 2019-03-23 ENCOUNTER — Other Ambulatory Visit: Payer: Self-pay | Admitting: Internal Medicine

## 2019-03-27 ENCOUNTER — Ambulatory Visit: Payer: Medicare HMO | Attending: Family Medicine | Admitting: Family Medicine

## 2019-03-27 ENCOUNTER — Encounter: Payer: Self-pay | Admitting: Family Medicine

## 2019-03-27 ENCOUNTER — Other Ambulatory Visit: Payer: Self-pay

## 2019-03-27 DIAGNOSIS — K219 Gastro-esophageal reflux disease without esophagitis: Secondary | ICD-10-CM | POA: Diagnosis not present

## 2019-03-27 DIAGNOSIS — I110 Hypertensive heart disease with heart failure: Secondary | ICD-10-CM

## 2019-03-27 DIAGNOSIS — R399 Unspecified symptoms and signs involving the genitourinary system: Secondary | ICD-10-CM | POA: Diagnosis not present

## 2019-03-27 DIAGNOSIS — I5032 Chronic diastolic (congestive) heart failure: Secondary | ICD-10-CM

## 2019-03-27 DIAGNOSIS — I10 Essential (primary) hypertension: Secondary | ICD-10-CM

## 2019-03-27 MED ORDER — TAMSULOSIN HCL 0.4 MG PO CAPS: 0.4000 mg | ORAL_CAPSULE | Freq: Every day | ORAL | 1 refills | Status: DC

## 2019-03-27 MED ORDER — PANTOPRAZOLE SODIUM 40 MG PO TBEC: 40.0000 mg | DELAYED_RELEASE_TABLET | Freq: Every day | ORAL | 1 refills | Status: DC

## 2019-03-27 MED ORDER — ATORVASTATIN CALCIUM 40 MG PO TABS: 40.0000 mg | ORAL_TABLET | Freq: Every day | ORAL | 1 refills | Status: DC

## 2019-03-27 MED ORDER — METOPROLOL SUCCINATE ER 25 MG PO TB24: 25.0000 mg | ORAL_TABLET | Freq: Every day | ORAL | 1 refills | Status: DC

## 2019-03-27 NOTE — Progress Notes (Signed)
Virtual Visit via Telephone Note  I connected with [name] [name] on 03/27/19 at 10:30 AM EDT by telephone and verified that I am speaking with the correct person using two identifiers.   I discussed the limitations, risks, security and privacy concerns of performing an evaluation and management service by telephone and the availability of in person appointments. I also discussed with the patient that there may be a patient responsible charge related to this service. The patient expressed understanding and agreed to proceed.   History of Present Illness:  He is a 68 year old male with a history of paroxysmal A. fib, atrial flutter, diastolic congestive heart failure (EF 55-60% in 11/2017), CAD (status post cardiac cath in 11/2015 - mid LAD 20% stenosis, dist LAD 40% stenosis, Ost RCA to prox RCA 30% stenosis, normal LV systolic function), sick sinus syndrome, COPD, GERD, seizures (managed by neurology), BPH, hypogonadism (managed by endocrine),Gout here for a follow up visit. He has dyspnea which is at baseline for him as he is able to walk to his mailbox and back and also has chronic lower extremity edema but denies being overly short of breath.  Currently has two-pillow orthopnea which is not abnormal for him. Denies chest pain. His reflux has been stable and lower urinary tract symptoms have been stable as well. He was seen for vertigo 2 months ago during an office visit and reports since then he has had only one episode and rarely has to use meclizine. He has no additional concerns today.  Observations/Objective: Alert, awake, oriented x3 Not in acute distress Normal mood  CMP Latest Ref Rng & Units 01/20/2019 05/23/2018 12/02/2017  Glucose 70 - 99 mg/dL 112(H) 77 88  BUN 8 - 23 mg/dL 11 15 18   Creatinine 0.61 - 1.24 mg/dL 1.34(H) 1.24 1.18  Sodium 135 - 145 mmol/L 139 144 136  Potassium 3.5 - 5.1 mmol/L 3.8 4.2 4.0  Chloride 98 - 111 mmol/L 102 105 107  CO2 22 - 32 mmol/L 26 26 20(L)   Calcium 8.9 - 10.3 mg/dL 8.7(L) 9.1 8.5(L)  Total Protein 6.0 - 8.5 g/dL - 7.5 -  Total Bilirubin 0.0 - 1.2 mg/dL - 0.4 -  Alkaline Phos 39 - 117 IU/L - 95 -  AST 0 - 40 IU/L - 16 -  ALT 0 - 44 IU/L - 13 -    Lipid Panel     Component Value Date/Time   CHOL 162 05/23/2018 1009   TRIG 77 05/23/2018 1009   HDL 54 05/23/2018 1009   CHOLHDL 3.0 05/23/2018 1009   CHOLHDL 2.5 12/09/2015 0133   VLDL 11 12/09/2015 0133   LDLCALC 93 05/23/2018 1009    Assessment and Plan: 1. Chronic diastolic congestive heart failure (HCC) Stable No acute exacerbation - atorvastatin (LIPITOR) 40 MG tablet; Take 1 tablet (40 mg total) by mouth daily at 6 PM.  Dispense: 90 tablet; Refill: 1  2. Essential hypertension, benign Reports from home blood pressure logs reveal blood pressure has been controlled Counseled on blood pressure goal of less than 130/80, low-sodium, DASH diet, medication compliance, 150 minutes of moderate intensity exercise per week. Discussed medication compliance, adverse effects. - metoprolol succinate (TOPROL-XL) 25 MG 24 hr tablet; Take 1 tablet (25 mg total) by mouth daily.  Dispense: 90 tablet; Refill: 1  3. Gastroesophageal reflux disease without esophagitis Controlled - pantoprazole (PROTONIX) 40 MG tablet; Take 1 tablet (40 mg total) by mouth daily.  Dispense: 90 tablet; Refill: 1  4. Lower urinary tract symptoms  Stable - tamsulosin (FLOMAX) 0.4 MG CAPS capsule; Take 1 capsule (0.4 mg total) by mouth daily.  Dispense: 90 capsule; Refill: 1   Follow Up Instructions:    I discussed the assessment and treatment plan with the patient. The patient was provided an opportunity to ask questions and all were answered. The patient agreed with the plan and demonstrated an understanding of the instructions.   The patient was advised to call back or seek an in-person evaluation if the symptoms worsen or if the condition fails to improve as anticipated.  I provided 16 minutes of  non-face-to-face time during this encounter.   Charlott Rakes, MD

## 2019-03-27 NOTE — Progress Notes (Signed)
Patient has been called and DOB has been verified. Patient has been screened and transferred to PCP to start phone visit.  C/C: hypertension   Refills: atorvastatin,lasix,metoprolol,pantoprazole, tamsulosin.

## 2019-05-04 ENCOUNTER — Other Ambulatory Visit: Payer: Self-pay | Admitting: Family Medicine

## 2019-05-04 DIAGNOSIS — G8929 Other chronic pain: Secondary | ICD-10-CM

## 2019-05-21 DIAGNOSIS — D649 Anemia, unspecified: Secondary | ICD-10-CM | POA: Diagnosis not present

## 2019-05-21 DIAGNOSIS — I10 Essential (primary) hypertension: Secondary | ICD-10-CM | POA: Diagnosis not present

## 2019-05-21 DIAGNOSIS — K219 Gastro-esophageal reflux disease without esophagitis: Secondary | ICD-10-CM | POA: Diagnosis not present

## 2019-05-21 DIAGNOSIS — E785 Hyperlipidemia, unspecified: Secondary | ICD-10-CM | POA: Diagnosis not present

## 2019-05-21 DIAGNOSIS — J45909 Unspecified asthma, uncomplicated: Secondary | ICD-10-CM | POA: Diagnosis not present

## 2019-05-21 DIAGNOSIS — I2510 Atherosclerotic heart disease of native coronary artery without angina pectoris: Secondary | ICD-10-CM | POA: Diagnosis not present

## 2019-08-20 DIAGNOSIS — J45909 Unspecified asthma, uncomplicated: Secondary | ICD-10-CM | POA: Diagnosis not present

## 2019-08-20 DIAGNOSIS — I10 Essential (primary) hypertension: Secondary | ICD-10-CM | POA: Diagnosis not present

## 2019-08-20 DIAGNOSIS — E785 Hyperlipidemia, unspecified: Secondary | ICD-10-CM | POA: Diagnosis not present

## 2019-08-20 DIAGNOSIS — D649 Anemia, unspecified: Secondary | ICD-10-CM | POA: Diagnosis not present

## 2019-08-20 DIAGNOSIS — K219 Gastro-esophageal reflux disease without esophagitis: Secondary | ICD-10-CM | POA: Diagnosis not present

## 2019-08-20 DIAGNOSIS — I2510 Atherosclerotic heart disease of native coronary artery without angina pectoris: Secondary | ICD-10-CM | POA: Diagnosis not present

## 2019-09-15 ENCOUNTER — Telehealth: Payer: Self-pay

## 2019-09-15 DIAGNOSIS — I5032 Chronic diastolic (congestive) heart failure: Secondary | ICD-10-CM

## 2019-09-15 NOTE — Telephone Encounter (Signed)
Patient came into clinic with labs from his cardiologist.  Patient was put on the lab schedule for tomorrow. Can you put these labs in for this patient.  Lipid panel HFP BMP Dif.level.  I have the paper if needed.

## 2019-09-16 ENCOUNTER — Ambulatory Visit: Payer: Medicare HMO | Attending: Family Medicine

## 2019-09-16 ENCOUNTER — Other Ambulatory Visit: Payer: Self-pay

## 2019-09-16 DIAGNOSIS — I5032 Chronic diastolic (congestive) heart failure: Secondary | ICD-10-CM | POA: Diagnosis not present

## 2019-09-16 NOTE — Telephone Encounter (Signed)
Ordered

## 2019-09-17 LAB — COMPREHENSIVE METABOLIC PANEL
ALT: 15 IU/L (ref 0–44)
AST: 23 IU/L (ref 0–40)
Albumin/Globulin Ratio: 1 — ABNORMAL LOW (ref 1.2–2.2)
Albumin: 3.8 g/dL (ref 3.8–4.8)
Alkaline Phosphatase: 104 IU/L (ref 39–117)
BUN/Creatinine Ratio: 11 (ref 10–24)
BUN: 16 mg/dL (ref 8–27)
Bilirubin Total: <0.2 mg/dL (ref 0.0–1.2)
CO2: 26 mmol/L (ref 20–29)
Calcium: 9.2 mg/dL (ref 8.6–10.2)
Chloride: 106 mmol/L (ref 96–106)
Creatinine, Ser: 1.45 mg/dL — ABNORMAL HIGH (ref 0.76–1.27)
GFR calc Af Amer: 56 mL/min/{1.73_m2} — ABNORMAL LOW (ref >59)
GFR calc non Af Amer: 48 mL/min/{1.73_m2} — ABNORMAL LOW (ref >59)
Globulin, Total: 3.7 g/dL (ref 1.5–4.5)
Glucose: 91 mg/dL (ref 65–99)
Potassium: 4.4 mmol/L (ref 3.5–5.2)
Sodium: 143 mmol/L (ref 134–144)
Total Protein: 7.5 g/dL (ref 6.0–8.5)

## 2019-09-17 LAB — LIPID PANEL
Chol/HDL Ratio: 3.2 ratio (ref 0.0–5.0)
Cholesterol, Total: 155 mg/dL (ref 100–199)
HDL: 48 mg/dL (ref >39)
LDL Chol Calc (NIH): 91 mg/dL (ref 0–99)
Triglycerides: 86 mg/dL (ref 0–149)
VLDL Cholesterol Cal: 16 mg/dL (ref 5–40)

## 2019-09-17 LAB — DIGOXIN LEVEL: Digoxin, Serum: <0.4 ng/mL — ABNORMAL LOW (ref 0.5–0.9)

## 2019-09-17 LAB — BRAIN NATRIURETIC PEPTIDE: BNP: 31.9 pg/mL (ref 0.0–100.0)

## 2019-09-19 ENCOUNTER — Telehealth: Payer: Self-pay

## 2019-09-19 NOTE — Telephone Encounter (Signed)
-----   Message from Charlott Rakes, MD sent at 09/17/2019  6:27 PM EDT ----- Labs are stable.  Please inform patient and fax to his cardiologist-Dr. Terrence Dupont

## 2019-09-19 NOTE — Telephone Encounter (Signed)
Patient was called and voicemail is currently full. Patient results will be faxed over to Dr. Terrence Dupont.

## 2019-09-28 ENCOUNTER — Other Ambulatory Visit: Payer: Self-pay | Admitting: Family Medicine

## 2019-09-28 DIAGNOSIS — R399 Unspecified symptoms and signs involving the genitourinary system: Secondary | ICD-10-CM

## 2019-10-28 ENCOUNTER — Other Ambulatory Visit: Payer: Self-pay | Admitting: Family Medicine

## 2019-10-28 DIAGNOSIS — R399 Unspecified symptoms and signs involving the genitourinary system: Secondary | ICD-10-CM

## 2019-11-14 ENCOUNTER — Other Ambulatory Visit: Payer: Self-pay

## 2019-11-18 ENCOUNTER — Ambulatory Visit (INDEPENDENT_AMBULATORY_CARE_PROVIDER_SITE_OTHER): Payer: Medicare HMO | Admitting: Endocrinology

## 2019-11-18 ENCOUNTER — Encounter: Payer: Self-pay | Admitting: Endocrinology

## 2019-11-18 ENCOUNTER — Other Ambulatory Visit: Payer: Self-pay

## 2019-11-18 VITALS — BP 110/78 | HR 66 | Ht 65.0 in | Wt 241.6 lb

## 2019-11-18 DIAGNOSIS — N62 Hypertrophy of breast: Secondary | ICD-10-CM | POA: Diagnosis not present

## 2019-11-18 DIAGNOSIS — E291 Testicular hypofunction: Secondary | ICD-10-CM

## 2019-11-18 LAB — TSH: TSH: 0.93 u[IU]/mL (ref 0.35–4.50)

## 2019-11-18 LAB — T4, FREE: Free T4: 0.81 ng/dL (ref 0.60–1.60)

## 2019-11-18 NOTE — Progress Notes (Signed)
Subjective:    Patient ID: [name] [name], male    DOB: [birth year], 68 y.o.   MRN: [id number]  HPI The state of at least three ongoing medical problems is addressed today, with interval history of each noted here:  Pt returns for f/u of idiopathic central hypogonadism (he has 1 biological child; he has never taken illicit androgens; he denies any h/o infertility; he was seen at [facility], where he was dx'ed with hypogonadotropic hypogonadism; however, pt says he wishes to f/u here instead; he was rx'ed with clomid starting in early 2015).  He takes clomid as rx'ed.  He reports muscle strength is good  This is a stable problem.   ED sxs persist: pt says this persists. viagra helps.  This is a stable problem. Gynecomastia: tamoxifen was rx'ed in early 2017; he declines surgery; he has never had breast imaging.  Since then, he says breast swelling persists.  This is a stable problem.   Past Medical History:  Diagnosis Date  . Anemia   . Asthma   . Atrial fibrillation ([place])   . CHF (congestive heart failure) ([place])   . COPD (chronic obstructive pulmonary disease) ([place])   . Heart disease   . Hypertension   . Syncope     Past Surgical History:  Procedure Laterality Date  . CARDIAC CATHETERIZATION N/A 12/08/2015   Procedure: Left Heart Cath and Coronary Angiography;  Surgeon: Charolette Forward, MD;  Location: [place] [facility];  Service: Cardiovascular;  Laterality: N/A;  . ESOPHAGUS SURGERY    . SHOULDER CLOSED REDUCTION Left 12/01/2017   Procedure: CLOSED REDUCTION SHOULDER;  Surgeon: Altamese Llano, MD;  Location: [place];  Service: Orthopedics;  Laterality: Left;    Social History   Socioeconomic History  . Marital status: Single    Spouse name: Not on file  . Number of children: 1  . Years of education: college  . Highest education level: Not on file  Occupational History  . 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    Employer: [employer]  Social Needs  . Financial resource strain: Not on file  .  Food insecurity    Worry: Not on file    Inability: Not on file  . Transportation needs    Medical: Not on file    Non-medical: Not on file  Tobacco Use  . Smoking status: Never Smoker  . Smokeless tobacco: Never Used  Substance and Sexual Activity  . Alcohol use: No    Alcohol/week: 0.0 standard drinks  . Drug use: No  . Sexual activity: Not Currently  Lifestyle  . Physical activity    Days per week: Not on file    Minutes per session: Not on file  . Stress: Not on file  Relationships  . Social Herbalist on phone: Not on file    Gets together: Not on file    Attends religious service: Not on file    Active member of club or organization: Not on file    Attends meetings of clubs or organizations: Not on file    Relationship status: Not on file  . Intimate partner violence    Fear of current or ex partner: Not on file    Emotionally abused: Not on file    Physically abused: Not on file    Forced sexual activity: Not on file  Other Topics Concern  . Not on file  Social History Narrative   Patient lives at home alone and he is single.  Patient is semi retired.    Education college.   Right handed.   Caffeine two cokes daily.    Current Outpatient Medications on File Prior to Visit  Medication Sig Dispense Refill  . acetaminophen (TYLENOL) 325 MG tablet Take 2 tablets (650 mg total) by mouth every 6 (six) hours as needed for mild pain (or Fever >/= 101). 30 tablet 0  . albuterol (PROVENTIL HFA;VENTOLIN HFA) 108 (90 Base) MCG/ACT inhaler Inhale 2 puffs into the lungs every 6 (six) hours as needed. 1 Inhaler 5  . albuterol (PROVENTIL) (2.5 MG/3ML) 0.083% nebulizer solution Take 3 mLs (2.5 mg total) by nebulization every 6 (six) hours as needed for wheezing or shortness of breath. 150 mL 1  . allopurinol (ZYLOPRIM) 300 MG tablet TAKE 1 TABLET BY MOUTH EVERY DAY 90 tablet 2  . amiodarone (PACERONE) 200 MG tablet Take 1 tablet (200 mg total) by mouth 2 (two) times  daily. 60 tablet 2  . ARNUITY ELLIPTA 100 MCG/ACT AEPB TAKE 1 PUFF BY MOUTH EVERY DAY 30 each 0  . ARNUITY ELLIPTA 100 MCG/ACT AEPB Inhale 1 puff into the lungs daily. 30 each 3  . aspirin EC 81 MG tablet Take 81 mg by mouth daily.      Marland Kitchen atorvastatin (LIPITOR) 40 MG tablet Take 1 tablet (40 mg total) by mouth daily at 6 PM. 90 tablet 1  . clomiPHENE (CLOMID) 50 MG tablet 1/4 tab daily    . clotrimazole-betamethasone (LOTRISONE) cream Apply 1 application topically 2 (two) times daily as needed (FOR RASH).    Marland Kitchen digoxin (LANOXIN) 0.125 MG tablet Take 1 tablet (0.125 mg total) by mouth daily. 30 tablet 3  . ferrous sulfate 325 (65 FE) MG tablet Take 325 mg by mouth daily with breakfast. Reported on 03/16/2016    . furosemide (LASIX) 20 MG tablet TAKE 1 TABLET BY MOUTH EVERY DAY 30 tablet 5  . losartan (COZAAR) 100 MG tablet Take 100 mg by mouth daily.  3  . meclizine (ANTIVERT) 25 MG tablet Take 1 tablet (25 mg total) by mouth 3 (three) times daily as needed for dizziness. 30 tablet 0  . methocarbamol (ROBAXIN) 500 MG tablet TAKE 1 TABLET (500 MG TOTAL) BY MOUTH EVERY 8 (EIGHT) HOURS AS NEEDED FOR MUSCLE SPASM/PAIN 90 tablet 0  . metoprolol succinate (TOPROL-XL) 25 MG 24 hr tablet Take 1 tablet (25 mg total) by mouth daily. 90 tablet 1  . nitroGLYCERIN (NITROSTAT) 0.4 MG SL tablet Place 1 tablet (0.4 mg total) under the tongue every 5 (five) minutes as needed for chest pain. 25 tablet 1  . pantoprazole (PROTONIX) 40 MG tablet Take 1 tablet (40 mg total) by mouth daily. 90 tablet 1  . potassium chloride (K-DUR) 10 MEQ tablet Take 10 mEq by mouth daily.    . sildenafil (REVATIO) 20 MG tablet Take 20 mg by mouth as needed (for ED).     Marland Kitchen tamoxifen (NOLVADEX) 10 MG tablet Take 10 mg by mouth daily.    . tamsulosin (FLOMAX) 0.4 MG CAPS capsule Take 1 capsule (0.4 mg total) by mouth daily. Must have office visit for refills 30 capsule 0  . triazolam (HALCION) 0.25 MG tablet Take 1 tab by mouth 1 hour prior  to procedure, do not drive motor vehicle. 2 tablet 0  . UNABLE TO FIND Outpatient PT, OT  Diagnosis: Shoulder dislocation, syncope 1 Mutually Defined 0   No current facility-administered medications on file prior to visit.     No Known  Allergies  Family History  Problem Relation Age of Onset  . Cancer Mother     BP 110/78 (BP Location: Right Arm, Patient Position: Sitting, Cuff Size: Large)   Pulse 66   Ht 5\' 5"  (1.651 m)   Wt 241 lb 9.6 oz (109.6 kg)   SpO2 98%   BMI 40.20 kg/m    Review of Systems Denies ankle swelling and sob.     Objective:   Physical Exam VITAL SIGNS:  See vs page GENERAL: no distress BREASTS: moderate bilat pseudogynecomastia, but no palpable ductal tissue.        Assessment & Plan:  Gynecomastia: check mammography Hypogonadism: check labs today ED: well-controlled. Please continue the same medication.  Patient Instructions  Please continue the same medications.   blood tests are requested for you today.  We'll let you know about the results.   normalization of testosterone is not known to harm you.  however, there are "theoretical" risks, including increased fertility, hair loss, prostate cancer, benign prostate enlargement, blood clots, liver problems, lower hdl ("good cholesterol"), polycythemia (opposite of anemia), sleep apnea, and behavior changes.   Weight loss helps theses conditions also.  Please come back for a follow-up appointment in 1 year.

## 2019-11-18 NOTE — Patient Instructions (Addendum)
Please continue the same medications.   blood tests are requested for you today.  We'll let you know about the results.   normalization of testosterone is not known to harm you.  however, there are "theoretical" risks, including increased fertility, hair loss, prostate cancer, benign prostate enlargement, blood clots, liver problems, lower hdl ("good cholesterol"), polycythemia (opposite of anemia), sleep apnea, and behavior changes.   Weight loss helps theses conditions also.  Please come back for a follow-up appointment in 1 year.

## 2019-11-19 DIAGNOSIS — E785 Hyperlipidemia, unspecified: Secondary | ICD-10-CM | POA: Diagnosis not present

## 2019-11-19 DIAGNOSIS — D649 Anemia, unspecified: Secondary | ICD-10-CM | POA: Diagnosis not present

## 2019-11-19 DIAGNOSIS — I2510 Atherosclerotic heart disease of native coronary artery without angina pectoris: Secondary | ICD-10-CM | POA: Diagnosis not present

## 2019-11-19 DIAGNOSIS — I10 Essential (primary) hypertension: Secondary | ICD-10-CM | POA: Diagnosis not present

## 2019-11-19 DIAGNOSIS — K219 Gastro-esophageal reflux disease without esophagitis: Secondary | ICD-10-CM | POA: Diagnosis not present

## 2019-11-19 DIAGNOSIS — J45909 Unspecified asthma, uncomplicated: Secondary | ICD-10-CM | POA: Diagnosis not present

## 2019-11-19 DIAGNOSIS — R7303 Prediabetes: Secondary | ICD-10-CM | POA: Diagnosis not present

## 2019-11-20 LAB — TESTOSTERONE,FREE AND TOTAL
Testosterone, Free: 7 pg/mL (ref 6.6–18.1)
Testosterone: 162 ng/dL — ABNORMAL LOW (ref 264–916)

## 2019-11-21 ENCOUNTER — Telehealth: Payer: Self-pay | Admitting: Endocrinology

## 2019-11-21 NOTE — Telephone Encounter (Signed)
please contact patient: I noticed you have not yet had a mammogram.  It is easy.  you will receive a phone call, about a day and time for an appointment

## 2019-11-24 NOTE — Telephone Encounter (Signed)
LMTCB

## 2019-11-27 ENCOUNTER — Other Ambulatory Visit: Payer: Self-pay | Admitting: Endocrinology

## 2019-11-27 DIAGNOSIS — N62 Hypertrophy of breast: Secondary | ICD-10-CM

## 2019-12-02 ENCOUNTER — Ambulatory Visit: Payer: Medicare HMO | Attending: Family Medicine | Admitting: Family Medicine

## 2019-12-02 ENCOUNTER — Encounter: Payer: Self-pay | Admitting: Family Medicine

## 2019-12-02 DIAGNOSIS — I10 Essential (primary) hypertension: Secondary | ICD-10-CM | POA: Diagnosis not present

## 2019-12-02 DIAGNOSIS — R399 Unspecified symptoms and signs involving the genitourinary system: Secondary | ICD-10-CM

## 2019-12-02 DIAGNOSIS — I495 Sick sinus syndrome: Secondary | ICD-10-CM

## 2019-12-02 DIAGNOSIS — I5032 Chronic diastolic (congestive) heart failure: Secondary | ICD-10-CM | POA: Diagnosis not present

## 2019-12-02 DIAGNOSIS — K219 Gastro-esophageal reflux disease without esophagitis: Secondary | ICD-10-CM

## 2019-12-02 MED ORDER — TAMSULOSIN HCL 0.4 MG PO CAPS: 0.4000 mg | ORAL_CAPSULE | Freq: Every day | ORAL | 1 refills | Status: DC

## 2019-12-02 MED ORDER — ATORVASTATIN CALCIUM 40 MG PO TABS: 40.0000 mg | ORAL_TABLET | Freq: Every day | ORAL | 1 refills | Status: DC

## 2019-12-02 MED ORDER — PANTOPRAZOLE SODIUM 40 MG PO TBEC: 40.0000 mg | DELAYED_RELEASE_TABLET | Freq: Every day | ORAL | 1 refills | Status: DC

## 2019-12-02 MED ORDER — ALLOPURINOL 300 MG PO TABS: 300.0000 mg | ORAL_TABLET | Freq: Every day | ORAL | 2 refills | Status: DC

## 2019-12-02 MED ORDER — METOPROLOL SUCCINATE ER 25 MG PO TB24: 25.0000 mg | ORAL_TABLET | Freq: Every day | ORAL | 1 refills | Status: DC

## 2019-12-02 NOTE — Progress Notes (Signed)
Virtual Visit via Telephone Note  I connected with [name] [name], on 12/02/2019 at 2:37 PM by telephone due to the COVID-19 pandemic and verified that I am speaking with the correct person using two identifiers.   Consent: I discussed the limitations, risks, security and privacy concerns of performing an evaluation and management service by telephone and the availability of in person appointments. I also discussed with the patient that there may be a patient responsible charge related to this service. The patient expressed understanding and agreed to proceed.   Location of Patient: Home  Location of Provider: Clinic   Persons participating in Telemedicine visit: Dennard Airey Farrington-CMA Dr. Margarita Rana     History of Present Illness: He is a 68 year old male with a history of paroxysmal A. fib, atrial flutter, diastolic congestive heart failure (EF 55-60% in 11/2017), CAD (status post cardiac cath in 11/2015 - mid LAD 20% stenosis, dist LAD 40% stenosis, Ost RCA to prox RCA 30% stenosis, normal LV systolic function), sick sinus syndrome, COPD, GERD, seizures (managed by neurology), BPH, hypogonadism (managed by endocrine),Gout here for a follow up visit.   He states he blacked out last week Wednesday prior to that he last blacked out in 2018. Denies presence of seizure; he lives alone. States he was eating pork and suddenly did not feel right and when he stood up felt lightheaded, took two steps and found himself on the floor. Denies presence of chest pain, no pedal edema and he is able to walk to the mail box to get his mail and has some dyspnea which is not worse than usual Gout has been stable with no flare.  COPD is followed by pulmonary and he recently saw endocrine last month for management of his hypogonadism.  Past Medical History:  Diagnosis Date  . Anemia   . Asthma   . Atrial fibrillation ([place])   . CHF (congestive heart failure) ([place])   . COPD (chronic  obstructive pulmonary disease) ([place])   . Heart disease   . Hypertension   . Syncope    No Known Allergies  Current Outpatient Medications on File Prior to Visit  Medication Sig Dispense Refill  . acetaminophen (TYLENOL) 325 MG tablet Take 2 tablets (650 mg total) by mouth every 6 (six) hours as needed for mild pain (or Fever >/= 101). 30 tablet 0  . albuterol (PROVENTIL HFA;VENTOLIN HFA) 108 (90 Base) MCG/ACT inhaler Inhale 2 puffs into the lungs every 6 (six) hours as needed. 1 Inhaler 5  . albuterol (PROVENTIL) (2.5 MG/3ML) 0.083% nebulizer solution Take 3 mLs (2.5 mg total) by nebulization every 6 (six) hours as needed for wheezing or shortness of breath. 150 mL 1  . allopurinol (ZYLOPRIM) 300 MG tablet TAKE 1 TABLET BY MOUTH EVERY DAY 90 tablet 2  . amiodarone (PACERONE) 200 MG tablet Take 1 tablet (200 mg total) by mouth 2 (two) times daily. 60 tablet 2  . ARNUITY ELLIPTA 100 MCG/ACT AEPB TAKE 1 PUFF BY MOUTH EVERY DAY 30 each 0  . ARNUITY ELLIPTA 100 MCG/ACT AEPB Inhale 1 puff into the lungs daily. 30 each 3  . aspirin EC 81 MG tablet Take 81 mg by mouth daily.      Marland Kitchen atorvastatin (LIPITOR) 40 MG tablet Take 1 tablet (40 mg total) by mouth daily at 6 PM. 90 tablet 1  . clomiPHENE (CLOMID) 50 MG tablet 1/4 tab daily    . clotrimazole-betamethasone (LOTRISONE) cream Apply 1 application topically 2 (two) times daily  as needed (FOR RASH).    Marland Kitchen digoxin (LANOXIN) 0.125 MG tablet Take 1 tablet (0.125 mg total) by mouth daily. 30 tablet 3  . ferrous sulfate 325 (65 FE) MG tablet Take 325 mg by mouth daily with breakfast. Reported on 03/16/2016    . furosemide (LASIX) 20 MG tablet TAKE 1 TABLET BY MOUTH EVERY DAY 30 tablet 5  . losartan (COZAAR) 100 MG tablet Take 100 mg by mouth daily.  3  . meclizine (ANTIVERT) 25 MG tablet Take 1 tablet (25 mg total) by mouth 3 (three) times daily as needed for dizziness. 30 tablet 0  . methocarbamol (ROBAXIN) 500 MG tablet TAKE 1 TABLET (500 MG TOTAL) BY  MOUTH EVERY 8 (EIGHT) HOURS AS NEEDED FOR MUSCLE SPASM/PAIN 90 tablet 0  . metoprolol succinate (TOPROL-XL) 25 MG 24 hr tablet Take 1 tablet (25 mg total) by mouth daily. 90 tablet 1  . nitroGLYCERIN (NITROSTAT) 0.4 MG SL tablet Place 1 tablet (0.4 mg total) under the tongue every 5 (five) minutes as needed for chest pain. 25 tablet 1  . pantoprazole (PROTONIX) 40 MG tablet Take 1 tablet (40 mg total) by mouth daily. 90 tablet 1  . potassium chloride (K-DUR) 10 MEQ tablet Take 10 mEq by mouth daily.    . sildenafil (REVATIO) 20 MG tablet Take 20 mg by mouth as needed (for ED).     Marland Kitchen tamoxifen (NOLVADEX) 10 MG tablet Take 10 mg by mouth daily.    . tamsulosin (FLOMAX) 0.4 MG CAPS capsule Take 1 capsule (0.4 mg total) by mouth daily. Must have office visit for refills 30 capsule 0  . triazolam (HALCION) 0.25 MG tablet Take 1 tab by mouth 1 hour prior to procedure, do not drive motor vehicle. 2 tablet 0  . UNABLE TO FIND Outpatient PT, OT  Diagnosis: Shoulder dislocation, syncope 1 Mutually Defined 0   No current facility-administered medications on file prior to visit.     Observations/Objective: Alert, awake, oriented x3 Not in acute distress  Assessment and Plan: 1. Chronic diastolic congestive heart failure (HCC) Euvolemic - atorvastatin (LIPITOR) 40 MG tablet; Take 1 tablet (40 mg total) by mouth daily at 6 PM.  Dispense: 90 tablet; Refill: 1  2. Essential hypertension, benign Stable Counseled on blood pressure goal of less than 130/80, low-sodium, DASH diet, medication compliance, 150 minutes of moderate intensity exercise per week. Discussed medication compliance, adverse effects. - metoprolol succinate (TOPROL-XL) 25 MG 24 hr tablet; Take 1 tablet (25 mg total) by mouth daily.  Dispense: 90 tablet; Refill: 1  3. Gastroesophageal reflux disease without esophagitis Controlled - pantoprazole (PROTONIX) 40 MG tablet; Take 1 tablet (40 mg total) by mouth daily.  Dispense: 90 tablet;  Refill: 1  4. Lower urinary tract symptoms Stable - tamsulosin (FLOMAX) 0.4 MG CAPS capsule; Take 1 capsule (0.4 mg total) by mouth daily.  Dispense: 90 capsule; Refill: 1  5. Sick sinus syndrome ([place]) Could explain syncopal episodes Advised to contact his cardiologist for an appointment Advised against driving   Follow Up Instructions: 3 months-in person   I discussed the assessment and treatment plan with the patient. The patient was provided an opportunity to ask questions and all were answered. The patient agreed with the plan and demonstrated an understanding of the instructions.   The patient was advised to call back or seek an in-person evaluation if the symptoms worsen or if the condition fails to improve as anticipated.     I provided 17 minutes total  of non-face-to-face time during this encounter including median intraservice time, reviewing previous notes, labs, imaging, medications, management and patient verbalized understanding.     Charlott Rakes, MD, FAAFP. [facility] and [facility], [place]   12/02/2019, 2:37 PM

## 2019-12-02 NOTE — Progress Notes (Signed)
Patient has been called and DOB has been verified. Patient has been screened and transferred to PCP to start phone visit.   Patient states that he blacked out. Patient states that last black out was 2018.

## 2019-12-09 ENCOUNTER — Other Ambulatory Visit: Payer: Self-pay | Admitting: Family Medicine

## 2019-12-09 DIAGNOSIS — K219 Gastro-esophageal reflux disease without esophagitis: Secondary | ICD-10-CM

## 2019-12-09 DIAGNOSIS — I10 Essential (primary) hypertension: Secondary | ICD-10-CM

## 2019-12-10 ENCOUNTER — Other Ambulatory Visit: Payer: Medicare HMO

## 2019-12-16 ENCOUNTER — Other Ambulatory Visit: Payer: Self-pay | Admitting: Family Medicine

## 2019-12-16 DIAGNOSIS — I5032 Chronic diastolic (congestive) heart failure: Secondary | ICD-10-CM

## 2019-12-24 ENCOUNTER — Ambulatory Visit
Admission: RE | Admit: 2019-12-24 | Discharge: 2019-12-24 | Disposition: A | Discharge status: Home / Self Care | Payer: Medicare HMO | Source: Ambulatory Visit | Attending: Endocrinology | Admitting: Endocrinology

## 2019-12-24 ENCOUNTER — Other Ambulatory Visit: Payer: Self-pay

## 2019-12-24 ENCOUNTER — Ambulatory Visit: Payer: Medicare HMO

## 2019-12-24 DIAGNOSIS — N62 Hypertrophy of breast: Secondary | ICD-10-CM

## 2019-12-24 DIAGNOSIS — R928 Other abnormal and inconclusive findings on diagnostic imaging of breast: Secondary | ICD-10-CM | POA: Diagnosis not present

## 2020-02-18 DIAGNOSIS — D649 Anemia, unspecified: Secondary | ICD-10-CM | POA: Diagnosis not present

## 2020-02-18 DIAGNOSIS — K219 Gastro-esophageal reflux disease without esophagitis: Secondary | ICD-10-CM | POA: Diagnosis not present

## 2020-02-18 DIAGNOSIS — R7303 Prediabetes: Secondary | ICD-10-CM | POA: Diagnosis not present

## 2020-02-18 DIAGNOSIS — J45909 Unspecified asthma, uncomplicated: Secondary | ICD-10-CM | POA: Diagnosis not present

## 2020-02-18 DIAGNOSIS — E785 Hyperlipidemia, unspecified: Secondary | ICD-10-CM | POA: Diagnosis not present

## 2020-02-18 DIAGNOSIS — I2510 Atherosclerotic heart disease of native coronary artery without angina pectoris: Secondary | ICD-10-CM | POA: Diagnosis not present

## 2020-02-18 DIAGNOSIS — I10 Essential (primary) hypertension: Secondary | ICD-10-CM | POA: Diagnosis not present

## 2020-05-19 DIAGNOSIS — I10 Essential (primary) hypertension: Secondary | ICD-10-CM | POA: Diagnosis not present

## 2020-05-19 DIAGNOSIS — K219 Gastro-esophageal reflux disease without esophagitis: Secondary | ICD-10-CM | POA: Diagnosis not present

## 2020-05-19 DIAGNOSIS — D649 Anemia, unspecified: Secondary | ICD-10-CM | POA: Diagnosis not present

## 2020-05-19 DIAGNOSIS — I2510 Atherosclerotic heart disease of native coronary artery without angina pectoris: Secondary | ICD-10-CM | POA: Diagnosis not present

## 2020-05-19 DIAGNOSIS — R7303 Prediabetes: Secondary | ICD-10-CM | POA: Diagnosis not present

## 2020-05-19 DIAGNOSIS — E785 Hyperlipidemia, unspecified: Secondary | ICD-10-CM | POA: Diagnosis not present

## 2020-05-27 ENCOUNTER — Other Ambulatory Visit: Payer: Self-pay | Admitting: Family Medicine

## 2020-05-27 DIAGNOSIS — R399 Unspecified symptoms and signs involving the genitourinary system: Secondary | ICD-10-CM

## 2020-05-27 DIAGNOSIS — I10 Essential (primary) hypertension: Secondary | ICD-10-CM

## 2020-06-02 DIAGNOSIS — H524 Presbyopia: Secondary | ICD-10-CM | POA: Diagnosis not present

## 2020-06-02 DIAGNOSIS — Z135 Encounter for screening for eye and ear disorders: Secondary | ICD-10-CM | POA: Diagnosis not present

## 2020-06-10 ENCOUNTER — Other Ambulatory Visit: Payer: Self-pay | Admitting: Family Medicine

## 2020-06-10 DIAGNOSIS — Z0100 Encounter for examination of eyes and vision without abnormal findings: Secondary | ICD-10-CM | POA: Diagnosis not present

## 2020-06-10 DIAGNOSIS — I5032 Chronic diastolic (congestive) heart failure: Secondary | ICD-10-CM

## 2020-06-20 ENCOUNTER — Other Ambulatory Visit: Payer: Self-pay | Admitting: Family Medicine

## 2020-06-20 DIAGNOSIS — I10 Essential (primary) hypertension: Secondary | ICD-10-CM

## 2020-06-20 DIAGNOSIS — R399 Unspecified symptoms and signs involving the genitourinary system: Secondary | ICD-10-CM

## 2020-07-08 ENCOUNTER — Other Ambulatory Visit: Payer: Self-pay | Admitting: Family Medicine

## 2020-07-08 DIAGNOSIS — I5032 Chronic diastolic (congestive) heart failure: Secondary | ICD-10-CM

## 2020-07-28 DIAGNOSIS — L280 Lichen simplex chronicus: Secondary | ICD-10-CM | POA: Diagnosis not present

## 2020-07-28 DIAGNOSIS — L304 Erythema intertrigo: Secondary | ICD-10-CM | POA: Diagnosis not present

## 2020-08-01 ENCOUNTER — Other Ambulatory Visit: Payer: Self-pay | Admitting: Family Medicine

## 2020-08-01 DIAGNOSIS — I5032 Chronic diastolic (congestive) heart failure: Secondary | ICD-10-CM

## 2020-08-01 NOTE — Telephone Encounter (Signed)
Requested Prescriptions  Pending Prescriptions Disp Refills  . furosemide (LASIX) 20 MG tablet [Pharmacy Med Name: FUROSEMIDE 20 MG TABLET] 50 tablet 0    Sig: TAKE 1 TABLET BY MOUTH EVERY DAY     Cardiovascular:  Diuretics - Loop Failed - 08/01/2020 10:31 AM      Failed - Cr in normal range and within 360 days    Creat  Date Value Ref Range Status  03/12/2015 1.06 0.50 - 1.35 mg/dL Final   Creatinine, Ser  Date Value Ref Range Status  09/16/2019 1.45 (H) 0.76 - 1.27 mg/dL Final   Creatinine,U  Date Value Ref Range Status  01/13/2011 148.1 mg/dL Final    Comment:    See lab report for associated comment(s)         Failed - Valid encounter within last 6 months    Recent Outpatient Visits          8 months ago Sick sinus syndrome ([place])   [place], Charlane Ferretti, MD   1 year ago Chronic diastolic congestive heart failure ([place])   [place], Charlane Ferretti, MD   1 year ago [place], [place], [place]   1 year ago Other emphysema [facility])   [place], Charlane Ferretti, MD   1 year ago Acute right ankle pain   [place], Charlane Ferretti, MD      Future Appointments            In 1 month Charlott Rakes, MD [place] - K in normal range and within 360 days    Potassium  Date Value Ref Range Status  09/16/2019 4.4 3.5 - 5.2 mmol/L Final         Passed - Ca in normal range and within 360 days    Calcium  Date Value Ref Range Status  09/16/2019 9.2 8.6 - 10.2 mg/dL Final   Calcium, Ion  Date Value Ref Range Status  12/08/2015 1.01 (L) 1.13 - 1.30 mmol/L Final         Passed - Na in normal range and within 360 days    Sodium  Date Value Ref Range Status  09/16/2019 143 134 - 144 mmol/L Final         Passed - Last BP in normal range    BP Readings  from Last 1 Encounters:  11/18/19 110/78

## 2020-08-18 DIAGNOSIS — E785 Hyperlipidemia, unspecified: Secondary | ICD-10-CM | POA: Diagnosis not present

## 2020-08-18 DIAGNOSIS — I2510 Atherosclerotic heart disease of native coronary artery without angina pectoris: Secondary | ICD-10-CM | POA: Diagnosis not present

## 2020-08-18 DIAGNOSIS — R7303 Prediabetes: Secondary | ICD-10-CM | POA: Diagnosis not present

## 2020-08-18 DIAGNOSIS — I10 Essential (primary) hypertension: Secondary | ICD-10-CM | POA: Diagnosis not present

## 2020-08-19 DIAGNOSIS — L304 Erythema intertrigo: Secondary | ICD-10-CM | POA: Diagnosis not present

## 2020-08-23 ENCOUNTER — Other Ambulatory Visit: Payer: Self-pay | Admitting: Family Medicine

## 2020-08-23 DIAGNOSIS — K219 Gastro-esophageal reflux disease without esophagitis: Secondary | ICD-10-CM

## 2020-09-02 ENCOUNTER — Other Ambulatory Visit: Payer: Self-pay | Admitting: Family Medicine

## 2020-09-02 DIAGNOSIS — I5032 Chronic diastolic (congestive) heart failure: Secondary | ICD-10-CM

## 2020-09-02 NOTE — Telephone Encounter (Signed)
Requested  medications are  due for refill today yes  Requested medications are on the active medication list yes  Last refill 6/17  Last visit 03/2020  Future visit scheduled 09/20/20  Notes to clinic Last refill said must have appt, does have one scheduled, please assess.

## 2020-09-13 ENCOUNTER — Other Ambulatory Visit: Payer: Self-pay | Admitting: Family Medicine

## 2020-09-20 ENCOUNTER — Ambulatory Visit: Payer: Medicare HMO | Attending: Family Medicine | Admitting: Family Medicine

## 2020-09-20 ENCOUNTER — Encounter: Payer: Self-pay | Admitting: Family Medicine

## 2020-09-20 ENCOUNTER — Other Ambulatory Visit: Payer: Self-pay

## 2020-09-20 VITALS — BP 120/72 | HR 67 | Ht 65.0 in | Wt 231.0 lb

## 2020-09-20 DIAGNOSIS — R399 Unspecified symptoms and signs involving the genitourinary system: Secondary | ICD-10-CM | POA: Diagnosis not present

## 2020-09-20 DIAGNOSIS — I5032 Chronic diastolic (congestive) heart failure: Secondary | ICD-10-CM

## 2020-09-20 DIAGNOSIS — L603 Nail dystrophy: Secondary | ICD-10-CM | POA: Diagnosis not present

## 2020-09-20 DIAGNOSIS — I495 Sick sinus syndrome: Secondary | ICD-10-CM

## 2020-09-20 DIAGNOSIS — M1A0710 Idiopathic chronic gout, right ankle and foot, without tophus (tophi): Secondary | ICD-10-CM

## 2020-09-20 DIAGNOSIS — E291 Testicular hypofunction: Secondary | ICD-10-CM

## 2020-09-20 DIAGNOSIS — I10 Essential (primary) hypertension: Secondary | ICD-10-CM

## 2020-09-20 DIAGNOSIS — J438 Other emphysema: Secondary | ICD-10-CM

## 2020-09-20 MED ORDER — FUROSEMIDE 20 MG PO TABS: 20.0000 mg | ORAL_TABLET | Freq: Every day | ORAL | 1 refills | Status: DC

## 2020-09-20 MED ORDER — ATORVASTATIN CALCIUM 40 MG PO TABS: 40.0000 mg | ORAL_TABLET | Freq: Every day | ORAL | 1 refills | Status: DC

## 2020-09-20 MED ORDER — METOPROLOL SUCCINATE ER 25 MG PO TB24: 25.0000 mg | ORAL_TABLET | Freq: Every day | ORAL | 1 refills | Status: DC

## 2020-09-20 MED ORDER — ALLOPURINOL 300 MG PO TABS: 300.0000 mg | ORAL_TABLET | Freq: Every day | ORAL | 1 refills | Status: DC

## 2020-09-20 NOTE — Progress Notes (Signed)
Needs refills on all medications.

## 2020-09-20 NOTE — Progress Notes (Signed)
Subjective:  Patient ID: [name] [name], male    DOB: [birth year]  Age: 69 y.o. MRN: [id number]  CC: Hypertension   HPI [name] [name] is a 69 year old male with a history of paroxysmal A. fib, atrial flutter, diastolic congestive heart failure (EF 55-60% in 11/2017), CAD (status post cardiac cath in 11/2015 - mid LAD 20% stenosis, dist LAD 40% stenosis, Ost RCA to prox RCA 30% stenosis, normal LV systolic function), sick sinus syndrome, COPD, GERD, seizures (managed by neurology), BPH, hypogonadism (managed by endocrine),Gout here for a follow up visit. He works with patients with Alzheimer's and Autism and this keeps him busy.  He has had no dizziness or syncope in the last 2 years and taking cat naps have been beneficial and he noticed the 'black out ' episodes which he previously had have stopped. His Cardiologist is Dr Terrence Dupont Does not exercise much; does have slight dyspnea on moderate exertion. His Gout has been stable with no flares Complains of deformed L 2nd toenail which he describes as thick but he declines exam today   Past Medical History:  Diagnosis Date  . Anemia   . Asthma   . Atrial fibrillation ([place])   . CHF (congestive heart failure) ([place])   . COPD (chronic obstructive pulmonary disease) ([place])   . Heart disease   . Hypertension   . Syncope     Past Surgical History:  Procedure Laterality Date  . CARDIAC CATHETERIZATION N/A 12/08/2015   Procedure: Left Heart Cath and Coronary Angiography;  Surgeon: Charolette Forward, MD;  Location: [facility];  Service: Cardiovascular;  Laterality: N/A;  . ESOPHAGUS SURGERY    . SHOULDER CLOSED REDUCTION Left 12/01/2017   Procedure: CLOSED REDUCTION SHOULDER;  Surgeon: Altamese Pine Lawn, MD;  Location: [facility];  Service: Orthopedics;  Laterality: Left;    Family History  Problem Relation Age of Onset  . Cancer Mother     No Known Allergies  Outpatient Medications Prior to Visit  Medication Sig Dispense Refill  .  acetaminophen (TYLENOL) 325 MG tablet Take 2 tablets (650 mg total) by mouth every 6 (six) hours as needed for mild pain (or Fever >/= 101). 30 tablet 0  . albuterol (PROVENTIL HFA;VENTOLIN HFA) 108 (90 Base) MCG/ACT inhaler Inhale 2 puffs into the lungs every 6 (six) hours as needed. 1 Inhaler 5  . albuterol (PROVENTIL) (2.5 MG/3ML) 0.083% nebulizer solution Take 3 mLs (2.5 mg total) by nebulization every 6 (six) hours as needed for wheezing or shortness of breath. 150 mL 1  . amiodarone (PACERONE) 200 MG tablet Take 1 tablet (200 mg total) by mouth 2 (two) times daily. 60 tablet 2  . ARNUITY ELLIPTA 100 MCG/ACT AEPB TAKE 1 PUFF BY MOUTH EVERY DAY 30 each 0  . ARNUITY ELLIPTA 100 MCG/ACT AEPB Inhale 1 puff into the lungs daily. 30 each 3  . aspirin EC 81 MG tablet Take 81 mg by mouth daily.      . clomiPHENE (CLOMID) 50 MG tablet 1/4 tab daily    . clotrimazole-betamethasone (LOTRISONE) cream Apply 1 application topically 2 (two) times daily as needed (FOR RASH).    Marland Kitchen digoxin (LANOXIN) 0.125 MG tablet Take 1 tablet (0.125 mg total) by mouth daily. 30 tablet 3  . ferrous sulfate 325 (65 FE) MG tablet Take 325 mg by mouth daily with breakfast. Reported on 03/16/2016    . losartan (COZAAR) 100 MG tablet Take 100 mg by mouth daily.  3  . meclizine (ANTIVERT) 25  MG tablet Take 1 tablet (25 mg total) by mouth 3 (three) times daily as needed for dizziness. 30 tablet 0  . methocarbamol (ROBAXIN) 500 MG tablet TAKE 1 TABLET (500 MG TOTAL) BY MOUTH EVERY 8 (EIGHT) HOURS AS NEEDED FOR MUSCLE SPASM/PAIN 90 tablet 0  . nitroGLYCERIN (NITROSTAT) 0.4 MG SL tablet Place 1 tablet (0.4 mg total) under the tongue every 5 (five) minutes as needed for chest pain. 25 tablet 1  . pantoprazole (PROTONIX) 40 MG tablet TAKE 1 TABLET BY MOUTH EVERY DAY 90 tablet 1  . potassium chloride (K-DUR) 10 MEQ tablet Take 10 mEq by mouth daily.    . sildenafil (REVATIO) 20 MG tablet Take 20 mg by mouth as needed (for ED).     Marland Kitchen  tamoxifen (NOLVADEX) 10 MG tablet Take 10 mg by mouth daily.    . tamsulosin (FLOMAX) 0.4 MG CAPS capsule TAKE 1 CAPSULE BY MOUTH EVERY DAY 30 capsule 0  . UNABLE TO FIND Outpatient PT, OT  Diagnosis: Shoulder dislocation, syncope 1 Mutually Defined 0  . allopurinol (ZYLOPRIM) 300 MG tablet TAKE 1 TABLET BY MOUTH EVERY DAY 90 tablet 0  . atorvastatin (LIPITOR) 40 MG tablet TAKE 1 TABLET (40 MG TOTAL) BY MOUTH DAILY AT 6 PM. MUST HAVE OFFICE VISIT FOR REFILLS 30 tablet 0  . furosemide (LASIX) 20 MG tablet TAKE 1 TABLET BY MOUTH EVERY DAY 50 tablet 0  . metoprolol succinate (TOPROL-XL) 25 MG 24 hr tablet TAKE 1 TABLET BY MOUTH EVERY DAY 30 tablet 0  . triazolam (HALCION) 0.25 MG tablet Take 1 tab by mouth 1 hour prior to procedure, do not drive motor vehicle. (Patient not taking: Reported on 09/20/2020) 2 tablet 0   No facility-administered medications prior to visit.     ROS Review of Systems  Constitutional: Negative for activity change and appetite change.  HENT: Negative for sinus pressure and sore throat.   Eyes: Negative for visual disturbance.  Respiratory: Negative for cough, chest tightness and shortness of breath.   Cardiovascular: Negative for chest pain and leg swelling.  Gastrointestinal: Negative for abdominal distention, abdominal pain, constipation and diarrhea.  Endocrine: Negative.   Genitourinary: Negative for dysuria.  Musculoskeletal: Negative for joint swelling and myalgias.  Skin: Negative for rash.  Allergic/Immunologic: Negative.   Neurological: Negative for weakness, light-headedness and numbness.  Psychiatric/Behavioral: Negative for dysphoric mood and suicidal ideas.    Objective:  BP 120/72   Pulse 67   Ht _0  (1.651 m)   Wt 231 lb (104.8 kg)   SpO2 97%   BMI 38.44 kg/m   BP/Weight 09/20/2020 11/18/2019 1/68/3729  Systolic BP 021 115 520  Diastolic BP 72 78 82  Wt. (Lbs) 231 241.6 246.2  BMI 38.44 40.2 40.97      Physical  Exam Constitutional:      Appearance: He is well-developed.  Neck:     Vascular: No JVD.  Cardiovascular:     Rate and Rhythm: Normal rate.     Heart sounds: Normal heart sounds. No murmur heard.   Pulmonary:     Effort: Pulmonary effort is normal.     Breath sounds: Normal breath sounds. No wheezing or rales.  Chest:     Chest wall: No tenderness.  Abdominal:     General: Bowel sounds are normal. There is no distension.     Palpations: Abdomen is soft. There is no mass.     Tenderness: There is no abdominal tenderness.  Musculoskeletal:  General: Normal range of motion.     Right lower leg: No edema.     Left lower leg: No edema.  Neurological:     Mental Status: He is alert and oriented to person, place, and time.  Psychiatric:        Mood and Affect: Mood normal.     CMP Latest Ref Rng & Units 09/16/2019 01/20/2019 05/23/2018  Glucose 65 - 99 mg/dL 91 112(H) 77  BUN 8 - 27 mg/dL _0 Creatinine 0.76 - 1.27 mg/dL 1.45(H) 1.34(H) 1.24  Sodium 134 - 144 mmol/L 143 139 144  Potassium 3.5 - 5.2 mmol/L 4.4 3.8 4.2  Chloride 96 - 106 mmol/L 106 102 105  CO2 20 - 29 mmol/L _1 Calcium 8.6 - 10.2 mg/dL 9.2 8.7(L) 9.1  Total Protein 6.0 - 8.5 g/dL 7.5 - 7.5  Total Bilirubin 0.0 - 1.2 mg/dL <0.2 - 0.4  Alkaline Phos 39 - 117 IU/L 104 - 95  AST 0 - 40 IU/L 23 - 16  ALT 0 - 44 IU/L 15 - 13    Lipid Panel     Component Value Date/Time   CHOL 155 09/16/2019 0939   TRIG 86 09/16/2019 0939   HDL 48 09/16/2019 0939   CHOLHDL 3.2 09/16/2019 0939   CHOLHDL 2.5 12/09/2015 0133   VLDL 11 12/09/2015 0133   LDLCALC 91 09/16/2019 0939    CBC    Component Value Date/Time   WBC 8.3 01/20/2019 0345   RBC 5.22 01/20/2019 0345   HGB 11.0 (L) 01/20/2019 0345   HGB 11.7 (L) 11/19/2017 1138   HCT 37.3 (L) 01/20/2019 0345   HCT 38.1 11/19/2017 1138   PLT 348 01/20/2019 0345   PLT 378 11/19/2017 1138   MCV 71.5 (L) 01/20/2019 0345   MCV 70 (L) 11/19/2017 1138   MCH  21.1 (L) 01/20/2019 0345   MCHC 29.5 (L) 01/20/2019 0345   RDW 15.9 (H) 01/20/2019 0345   RDW 17.2 (H) 11/19/2017 1138   LYMPHSABS 2.1 12/01/2017 2242   LYMPHSABS 3.1 11/19/2017 1138   MONOABS 1.2 (H) 12/01/2017 2242   EOSABS 0.3 12/01/2017 2242   EOSABS 0.5 (H) 11/19/2017 1138   BASOSABS 0.0 12/01/2017 2242   BASOSABS 0.1 11/19/2017 1138    Lab Results  Component Value Date   HGBA1C 5.8 (H) 12/08/2015    Assessment & Plan:  1. Chronic diastolic congestive heart failure (HCC) Euvolemic with EF of 55 to 60% from echo of 11/2017 Continue current regimen Follow up with cardiology - atorvastatin (LIPITOR) 40 MG tablet; Take 1 tablet (40 mg total) by mouth daily at 6 PM.  Dispense: 90 tablet; Refill: 1 - furosemide (LASIX) 20 MG tablet; Take 1 tablet (20 mg total) by mouth daily.  Dispense: 90 tablet; Refill: 1  2. Essential hypertension, benign Controlled Counseled on blood pressure goal of less than 130/80, low-sodium, DASH diet, medication compliance, 150 minutes of moderate intensity exercise per week. Discussed medication compliance, adverse effects. - CMP14+EGFR; Future - LP+Non-HDL Cholesterol; Future - metoprolol succinate (TOPROL-XL) 25 MG 24 hr tablet; Take 1 tablet (25 mg total) by mouth daily.  Dispense: 90 tablet; Refill: 1  3. Lower urinary tract symptoms Stable on Flomax  4. Sick sinus syndrome (HCC) Asymptomatic at this time  5. Hypogonadism male Stable Currently followed by endocrine  6. Other emphysema ([place]) Stable with no exacerbations  7. Dystrophic nail He declines examination of toenail at this time - Ambulatory referral to Podiatry  8. 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  Dispense: 90 tablet; Refill: 1    Meds ordered this encounter  Medications  . atorvastatin (LIPITOR) 40 MG tablet    Sig: Take 1 tablet (40 mg  total) by mouth daily at 6 PM.    Dispense:  90 tablet    Refill:  1  . allopurinol (ZYLOPRIM) 300 MG tablet    Sig: Take 1 tablet (300 mg total) by mouth daily.    Dispense:  90 tablet    Refill:  1  . furosemide (LASIX) 20 MG tablet    Sig: Take 1 tablet (20 mg total) by mouth daily.    Dispense:  90 tablet    Refill:  1  . metoprolol succinate (TOPROL-XL) 25 MG 24 hr tablet    Sig: Take 1 tablet (25 mg total) by mouth daily.    Dispense:  90 tablet    Refill:  1    Follow-up: Return in about 6 months (around 03/20/2021) for Chronic disease management.       Charlott Rakes, MD, FAAFP. [facility] [place], [place]   09/20/2020, 5:21 PM

## 2020-09-20 NOTE — Patient Instructions (Signed)

## 2020-09-22 ENCOUNTER — Ambulatory Visit: Payer: Medicare HMO | Attending: Family Medicine

## 2020-09-22 ENCOUNTER — Other Ambulatory Visit: Payer: Self-pay

## 2020-09-22 DIAGNOSIS — I10 Essential (primary) hypertension: Secondary | ICD-10-CM

## 2020-09-22 DIAGNOSIS — I5032 Chronic diastolic (congestive) heart failure: Secondary | ICD-10-CM

## 2020-09-22 NOTE — Addendum Note (Signed)
Addended by: Charlott Rakes on: 09/22/2020 11:30 AM   Modules accepted: Orders

## 2020-09-24 ENCOUNTER — Telehealth: Payer: Self-pay

## 2020-09-24 LAB — LP+NON-HDL CHOLESTEROL
Cholesterol, Total: 167 mg/dL (ref 100–199)
HDL: 52 mg/dL (ref >39)
LDL Chol Calc (NIH): 98 mg/dL (ref 0–99)
Total Non-HDL-Chol (LDL+VLDL): 115 mg/dL (ref 0–129)
Triglycerides: 92 mg/dL (ref 0–149)
VLDL Cholesterol Cal: 17 mg/dL (ref 5–40)

## 2020-09-24 LAB — CMP14+EGFR
ALT: 14 IU/L (ref 0–44)
AST: 25 IU/L (ref 0–40)
Albumin/Globulin Ratio: 1 — ABNORMAL LOW (ref 1.2–2.2)
Albumin: 3.8 g/dL (ref 3.7–4.7)
Alkaline Phosphatase: 99 IU/L (ref 44–121)
BUN/Creatinine Ratio: 21 (ref 10–24)
BUN: 29 mg/dL — ABNORMAL HIGH (ref 8–27)
Bilirubin Total: <0.2 mg/dL (ref 0.0–1.2)
CO2: 21 mmol/L (ref 20–29)
Calcium: 9.6 mg/dL (ref 8.6–10.2)
Chloride: 103 mmol/L (ref 96–106)
Creatinine, Ser: 1.4 mg/dL — ABNORMAL HIGH (ref 0.76–1.27)
GFR calc Af Amer: 58 mL/min/{1.73_m2} — ABNORMAL LOW (ref >59)
GFR calc non Af Amer: 50 mL/min/{1.73_m2} — ABNORMAL LOW (ref >59)
Globulin, Total: 3.9 g/dL (ref 1.5–4.5)
Glucose: 108 mg/dL — ABNORMAL HIGH (ref 65–99)
Potassium: 4 mmol/L (ref 3.5–5.2)
Sodium: 137 mmol/L (ref 134–144)
Total Protein: 7.7 g/dL (ref 6.0–8.5)

## 2020-09-24 LAB — HEMOGLOBIN A1C
Est. average glucose Bld gHb Est-mCnc: 120 mg/dL
Hgb A1c MFr Bld: 5.8 % — ABNORMAL HIGH (ref 4.8–5.6)

## 2020-09-24 LAB — DIGOXIN LEVEL: Digoxin, Serum: <0.4 ng/mL — ABNORMAL LOW (ref 0.5–0.9)

## 2020-09-24 LAB — TSH: TSH: 1.41 u[IU]/mL (ref 0.450–4.500)

## 2020-09-24 NOTE — Telephone Encounter (Signed)
Patient was called and a voicemail was left informing patient to return phone call for lab results. 

## 2020-09-24 NOTE — Telephone Encounter (Signed)
-----   Message from Charlott Rakes, MD sent at 09/24/2020  9:14 AM EDT ----- Thyroid labs normal, he is still in the prediabetic range and this is stable compared to previous labs.  Kidney function is minimally abnormal but no regimen changes need to be made at this time.  His digoxin level is stable compared to previous labs.  Please fax labs over to his cardiologist-Dr. Terrence Dupont.

## 2020-09-28 ENCOUNTER — Ambulatory Visit: Payer: Medicare HMO | Admitting: Podiatry

## 2020-10-11 ENCOUNTER — Ambulatory Visit (INDEPENDENT_AMBULATORY_CARE_PROVIDER_SITE_OTHER): Payer: Medicare HMO | Admitting: Podiatry

## 2020-10-11 ENCOUNTER — Other Ambulatory Visit: Payer: Self-pay

## 2020-10-11 DIAGNOSIS — B351 Tinea unguium: Secondary | ICD-10-CM | POA: Diagnosis not present

## 2020-10-11 DIAGNOSIS — M79675 Pain in left toe(s): Secondary | ICD-10-CM | POA: Diagnosis not present

## 2020-10-11 DIAGNOSIS — L989 Disorder of the skin and subcutaneous tissue, unspecified: Secondary | ICD-10-CM | POA: Diagnosis not present

## 2020-10-11 DIAGNOSIS — M79674 Pain in right toe(s): Secondary | ICD-10-CM

## 2020-10-11 NOTE — Progress Notes (Signed)
   SUBJECTIVE Patient presents to office today complaining of elongated, thickened nails that cause pain while ambulating in shoes.  He  is unable to trim his own nails. Patient is here for further evaluation and treatment.  Past Medical History:  Diagnosis Date  . Anemia   . Asthma   . Atrial fibrillation ([place])   . CHF (congestive heart failure) ([place])   . COPD (chronic obstructive pulmonary disease) ([place])   . Heart disease   . Hypertension   . Syncope     OBJECTIVE General Patient is awake, alert, and oriented x 3 and in no acute distress. Derm Skin is dry and supple bilateral. Negative open lesions or macerations. Remaining integument unremarkable. Nails are tender, long, thickened and dystrophic with subungual debris, consistent with onychomycosis, 1-5 bilateral. No signs of infection noted.  Hyperkeratotic preulcerative callus tissue also noted to the distal tips of the toes bilateral Vasc  DP and PT pedal pulses palpable bilaterally. Temperature gradient within normal limits.  Neuro Epicritic and protective threshold sensation grossly intact bilaterally.  Musculoskeletal Exam No symptomatic pedal deformities noted bilateral. Muscular strength within normal limits.  ASSESSMENT 1. Onychodystrophic nails 1-5 bilateral with hyperkeratosis of nails.  2. Onychomycosis of nail due to dermatophyte bilateral 3. Pain in foot bilateral 4.  Preulcerative calluses bilateral feet  PLAN OF CARE 1. Patient evaluated today.  2. Instructed to maintain good pedal hygiene and foot care.  3. Mechanical debridement of nails 1-5 bilaterally performed using a nail nipper. Filed with dremel without incident.  4.  Excisional debridement of the hyperkeratotic callus tissue was performed using a tissue nipper without incident or bleeding  5.  Return to clinic in 3 mos. for routine foot care   Edrick Kins, DPM [facility]  Dr. Edrick Kins, [place]                                         [place], [place] [address]                Office [phone]  Fax [phone]

## 2020-11-17 DIAGNOSIS — J45909 Unspecified asthma, uncomplicated: Secondary | ICD-10-CM | POA: Diagnosis not present

## 2020-11-17 DIAGNOSIS — I2510 Atherosclerotic heart disease of native coronary artery without angina pectoris: Secondary | ICD-10-CM | POA: Diagnosis not present

## 2020-11-17 DIAGNOSIS — E785 Hyperlipidemia, unspecified: Secondary | ICD-10-CM | POA: Diagnosis not present

## 2020-11-17 DIAGNOSIS — I10 Essential (primary) hypertension: Secondary | ICD-10-CM | POA: Diagnosis not present

## 2020-11-17 DIAGNOSIS — R7303 Prediabetes: Secondary | ICD-10-CM | POA: Diagnosis not present

## 2020-11-23 ENCOUNTER — Ambulatory Visit: Payer: Medicare HMO | Admitting: Endocrinology

## 2020-11-30 ENCOUNTER — Other Ambulatory Visit: Payer: Self-pay

## 2020-11-30 ENCOUNTER — Encounter: Payer: Self-pay | Admitting: Endocrinology

## 2020-11-30 ENCOUNTER — Ambulatory Visit (INDEPENDENT_AMBULATORY_CARE_PROVIDER_SITE_OTHER): Payer: Medicare HMO | Admitting: Endocrinology

## 2020-11-30 VITALS — BP 154/84 | HR 69 | Ht 67.0 in | Wt 248.6 lb

## 2020-11-30 DIAGNOSIS — Z125 Encounter for screening for malignant neoplasm of prostate: Secondary | ICD-10-CM | POA: Diagnosis not present

## 2020-11-30 DIAGNOSIS — N62 Hypertrophy of breast: Secondary | ICD-10-CM

## 2020-11-30 MED ORDER — TAMOXIFEN CITRATE 10 MG PO TABS
10.0000 mg | ORAL_TABLET | Freq: Two times a day (BID) | ORAL | 3 refills | Status: DC
Start: 2020-11-30 — End: 2023-07-25

## 2020-11-30 MED ORDER — SILDENAFIL CITRATE 100 MG PO TABS: 100.0000 mg | ORAL_TABLET | Freq: Every day | ORAL | 0 refills | Status: DC | PRN

## 2020-11-30 NOTE — Patient Instructions (Addendum)
Your blood pressure is high today.  Please see your primary care provider soon, to have it rechecked I have sent a prescription to your pharmacy, to refill Viagra.  It is not safe to take this with nitroglycerin.   Also, I have sent a prescription to your pharmacy, to increase the Tamoxifen. blood tests are requested for you today.  We'll let you know about the results.   normalization of testosterone is not known to harm you.  however, there are "theoretical" risks, including increased fertility, hair loss, prostate cancer, benign prostate enlargement, blood clots, liver problems, lower hdl ("good cholesterol"), polycythemia (opposite of anemia), sleep apnea, and behavior changes.   Weight loss helps theses conditions also.  Please come back for a follow-up appointment in 1 year.

## 2020-11-30 NOTE — Progress Notes (Signed)
Subjective:    Patient ID: [name] [name], male    DOB: 23-Dec-1951, 69 y.o.   MRN: [id number]  HPI Pt returns for f/u of idiopathic central hypogonadism (he has 1 biological child; he has never taken illicit androgens; he denies any h/o infertility; he was seen at [facility], where he was dx'ed with hypogonadotropic hypogonadism; however, pt says he wishes to f/u here instead; he was rx'ed with clomid starting in early 2015).  He takes clomid as rx'ed.    ED sxs persist: pt says this persists. He has not recently taken Viagra.   Gynecomastia: tamoxifen was rx'ed in early 2017; he declines surgery; he has never had breast imaging.  Since then, he says breast swelling persists.   Past Medical History:  Diagnosis Date  . Anemia   . Asthma   . Atrial fibrillation ([place])   . CHF (congestive heart failure) ([place])   . COPD (chronic obstructive pulmonary disease) ([place])   . Heart disease   . Hypertension   . Syncope     Past Surgical History:  Procedure Laterality Date  . CARDIAC CATHETERIZATION N/A 12/08/2015   Procedure: Left Heart Cath and Coronary Angiography;  Surgeon: Charolette Forward, MD;  Location: [facility];  Service: Cardiovascular;  Laterality: N/A;  . ESOPHAGUS SURGERY    . SHOULDER CLOSED REDUCTION Left 12/01/2017   Procedure: CLOSED REDUCTION SHOULDER;  Surgeon: Altamese North Bellmore, MD;  Location: [facility];  Service: Orthopedics;  Laterality: Left;    Social History   Socioeconomic History  . Marital status: Single    Spouse name: Not on file  . Number of children: 1  . Years of education: college  . Highest education level: Not on file  Occupational History  . 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 Smoking status: Never Smoker  . Smokeless tobacco: Never Used  Vaping Use  . Vaping Use: Never used  Substance and Sexual Activity  . Alcohol use: No    Alcohol/week: 0.0 standard drinks  . Drug use: No  . Sexual activity: Not Currently  Other  Topics Concern  . Not on file  Social History Narrative   Patient lives at home alone and he is single.   Patient is semi retired.    Education college.   Right handed.   Caffeine two cokes daily.   Social Determinants of Health   Financial Resource Strain: Not on file  Food Insecurity: Not on file  Transportation Needs: Not on file  Physical Activity: Not on file  Stress: Not on file  Social Connections: Not on file  Intimate Partner Violence: Not on file    Current Outpatient Medications on File Prior to Visit  Medication Sig Dispense Refill  . acetaminophen (TYLENOL) 325 MG tablet Take 2 tablets (650 mg total) by mouth every 6 (six) hours as needed for mild pain (or Fever >/= 101). 30 tablet 0  . albuterol (PROVENTIL HFA;VENTOLIN HFA) 108 (90 Base) MCG/ACT inhaler Inhale 2 puffs into the lungs every 6 (six) hours as needed. 1 Inhaler 5  . albuterol (PROVENTIL) (2.5 MG/3ML) 0.083% nebulizer solution Take 3 mLs (2.5 mg total) by nebulization every 6 (six) hours as needed for wheezing or shortness of breath. 150 mL 1  . allopurinol (ZYLOPRIM) 300 MG tablet Take 1 tablet (300 mg total) by mouth daily. 90 tablet 1  . amiodarone (PACERONE) 200 MG tablet Take 1 tablet (200 mg total) by mouth 2 (two)  times daily. 60 tablet 2  . ARNUITY ELLIPTA 100 MCG/ACT AEPB TAKE 1 PUFF BY MOUTH EVERY DAY 30 each 0  . ARNUITY ELLIPTA 100 MCG/ACT AEPB Inhale 1 puff into the lungs daily. 30 each 3  . aspirin EC 81 MG tablet Take 81 mg by mouth daily.      Marland Kitchen atorvastatin (LIPITOR) 40 MG tablet Take 1 tablet (40 mg total) by mouth daily at 6 PM. 90 tablet 1  . clomiPHENE (CLOMID) 50 MG tablet 1/4 tab daily    . clotrimazole-betamethasone (LOTRISONE) cream Apply 1 application topically 2 (two) times daily as needed (FOR RASH).    Marland Kitchen digoxin (LANOXIN) 0.125 MG tablet Take 1 tablet (0.125 mg total) by mouth daily. 30 tablet 3  . ferrous sulfate 325 (65 FE) MG tablet Take 325 mg by mouth daily with breakfast.  Reported on 03/16/2016    . furosemide (LASIX) 20 MG tablet Take 1 tablet (20 mg total) by mouth daily. 90 tablet 1  . losartan (COZAAR) 100 MG tablet Take 100 mg by mouth daily.  3  . meclizine (ANTIVERT) 25 MG tablet Take 1 tablet (25 mg total) by mouth 3 (three) times daily as needed for dizziness. 30 tablet 0  . methocarbamol (ROBAXIN) 500 MG tablet TAKE 1 TABLET (500 MG TOTAL) BY MOUTH EVERY 8 (EIGHT) HOURS AS NEEDED FOR MUSCLE SPASM/PAIN 90 tablet 0  . metoprolol succinate (TOPROL-XL) 25 MG 24 hr tablet Take 1 tablet (25 mg total) by mouth daily. 90 tablet 1  . nitroGLYCERIN (NITROSTAT) 0.4 MG SL tablet Place 1 tablet (0.4 mg total) under the tongue every 5 (five) minutes as needed for chest pain. 25 tablet 1  . pantoprazole (PROTONIX) 40 MG tablet TAKE 1 TABLET BY MOUTH EVERY DAY 90 tablet 1  . potassium chloride (K-DUR) 10 MEQ tablet Take 10 mEq by mouth daily.    . tamsulosin (FLOMAX) 0.4 MG CAPS capsule TAKE 1 CAPSULE BY MOUTH EVERY DAY 30 capsule 0  . triazolam (HALCION) 0.25 MG tablet Take 1 tab by mouth 1 hour prior to procedure, do not drive motor vehicle. 2 tablet 0  . UNABLE TO FIND Outpatient PT, OT  Diagnosis: Shoulder dislocation, syncope 1 Mutually Defined 0   No current facility-administered medications on file prior to visit.    No Known Allergies  Family History  Problem Relation Age of Onset  . Cancer Mother     BP (!) 154/84   Pulse 69   Ht 5\' 7"  (1.702 m)   Wt 248 lb 9.6 oz (112.8 kg)   SpO2 98%   BMI 38.94 kg/m    Review of Systems Denies decreased urinary stream.     Objective:   Physical Exam VITAL SIGNS:  See vs page.   GENERAL: no distress.   Breasts: moderate bilat gynecomastia.    Lab Results  Component Value Date   TSH 1.410 09/22/2020   Lab Results  Component Value Date   PSA 0.59 11/13/2018   PSA 0.70 05/22/2017   PSA 0.83 02/22/2016        Assessment & Plan:  Hypogonadism: due for recheck Gynecomastia:  uncontrolled  Patient Instructions  Your blood pressure is high today.  Please see your primary care provider soon, to have it rechecked I have sent a prescription to your pharmacy, to refill Viagra.  It is not safe to take this with nitroglycerin.   Also, I have sent a prescription to your pharmacy, to increase the Tamoxifen. blood tests are  requested for you today.  We'll let you know about the results.   normalization of testosterone is not known to harm you.  however, there are "theoretical" risks, including increased fertility, hair loss, prostate cancer, benign prostate enlargement, blood clots, liver problems, lower hdl ("good cholesterol"), polycythemia (opposite of anemia), sleep apnea, and behavior changes.   Weight loss helps theses conditions also.  Please come back for a follow-up appointment in 1 year.

## 2020-12-02 ENCOUNTER — Other Ambulatory Visit: Payer: Medicare HMO

## 2020-12-03 ENCOUNTER — Other Ambulatory Visit: Payer: Self-pay

## 2020-12-03 ENCOUNTER — Other Ambulatory Visit (INDEPENDENT_AMBULATORY_CARE_PROVIDER_SITE_OTHER): Payer: Medicare HMO

## 2020-12-03 DIAGNOSIS — Z125 Encounter for screening for malignant neoplasm of prostate: Secondary | ICD-10-CM | POA: Diagnosis not present

## 2020-12-03 DIAGNOSIS — N62 Hypertrophy of breast: Secondary | ICD-10-CM

## 2020-12-03 LAB — PSA: PSA: 0.59 ng/mL (ref 0.10–4.00)

## 2020-12-05 LAB — TESTOSTERONE,FREE AND TOTAL
Testosterone, Free: 6.9 pg/mL (ref 6.6–18.1)
Testosterone: 131 ng/dL — ABNORMAL LOW (ref 264–916)

## 2021-01-12 ENCOUNTER — Ambulatory Visit: Payer: Medicare HMO | Admitting: Podiatry

## 2021-01-19 ENCOUNTER — Ambulatory Visit: Payer: Medicare HMO | Admitting: Podiatry

## 2021-02-07 ENCOUNTER — Ambulatory Visit (INDEPENDENT_AMBULATORY_CARE_PROVIDER_SITE_OTHER): Payer: Medicare HMO | Admitting: Podiatry

## 2021-02-07 ENCOUNTER — Other Ambulatory Visit: Payer: Self-pay

## 2021-02-07 DIAGNOSIS — B351 Tinea unguium: Secondary | ICD-10-CM | POA: Diagnosis not present

## 2021-02-07 DIAGNOSIS — L989 Disorder of the skin and subcutaneous tissue, unspecified: Secondary | ICD-10-CM | POA: Diagnosis not present

## 2021-02-07 DIAGNOSIS — M79675 Pain in left toe(s): Secondary | ICD-10-CM

## 2021-02-07 DIAGNOSIS — M79674 Pain in right toe(s): Secondary | ICD-10-CM

## 2021-02-07 NOTE — Progress Notes (Signed)
   SUBJECTIVE Patient presents to office today complaining of elongated, thickened nails that cause pain while ambulating in shoes.  He  is unable to trim his own nails. Patient is here for further evaluation and treatment.  Past Medical History:  Diagnosis Date   Anemia    Asthma    Atrial fibrillation (HCC)    CHF (congestive heart failure) (HCC)    COPD (chronic obstructive pulmonary disease) (HCC)    Heart disease    Hypertension    Syncope     OBJECTIVE General Patient is awake, alert, and oriented x 3 and in no acute distress. Derm Skin is dry and supple bilateral. Negative open lesions or macerations. Remaining integument unremarkable. Nails are tender, long, thickened and dystrophic with subungual debris, consistent with onychomycosis, 1-5 bilateral. No signs of infection noted.  Hyperkeratotic preulcerative callus tissue also noted to the distal tips of the toes bilateral Vasc  DP and PT pedal pulses palpable bilaterally. Temperature gradient within normal limits.  Neuro Epicritic and protective threshold sensation grossly intact bilaterally.  Musculoskeletal Exam No symptomatic pedal deformities noted bilateral. Muscular strength within normal limits.  ASSESSMENT 1. Onychodystrophic nails 1-5 bilateral with hyperkeratosis of nails.  2. Onychomycosis of nail due to dermatophyte bilateral 3. Pain in foot bilateral 4.  Preulcerative calluses bilateral feet  PLAN OF CARE 1. Patient evaluated today.  2. Instructed to maintain good pedal hygiene and foot care.  3. Mechanical debridement of nails 1-5 bilaterally performed using a nail nipper. Filed with dremel without incident.  4.  Excisional debridement of the hyperkeratotic callus tissue was performed using a tissue nipper without incident or bleeding  5.  Return to clinic in 3 mos. for routine foot care   Brent M. Evans, DPM [facility]  Dr. Brent M. Evans, DPM    [address]                                        [place], [place] [address]                Office [phone]  Fax [phone]     

## 2021-02-16 DIAGNOSIS — I2510 Atherosclerotic heart disease of native coronary artery without angina pectoris: Secondary | ICD-10-CM | POA: Diagnosis not present

## 2021-02-16 DIAGNOSIS — J45909 Unspecified asthma, uncomplicated: Secondary | ICD-10-CM | POA: Diagnosis not present

## 2021-02-16 DIAGNOSIS — I10 Essential (primary) hypertension: Secondary | ICD-10-CM | POA: Diagnosis not present

## 2021-02-16 DIAGNOSIS — R7303 Prediabetes: Secondary | ICD-10-CM | POA: Diagnosis not present

## 2021-02-16 DIAGNOSIS — E785 Hyperlipidemia, unspecified: Secondary | ICD-10-CM | POA: Diagnosis not present

## 2021-02-21 ENCOUNTER — Other Ambulatory Visit: Payer: Self-pay | Admitting: Family Medicine

## 2021-02-21 DIAGNOSIS — K219 Gastro-esophageal reflux disease without esophagitis: Secondary | ICD-10-CM

## 2021-03-16 ENCOUNTER — Other Ambulatory Visit: Payer: Self-pay | Admitting: Family Medicine

## 2021-03-16 DIAGNOSIS — I5032 Chronic diastolic (congestive) heart failure: Secondary | ICD-10-CM

## 2021-03-16 DIAGNOSIS — I10 Essential (primary) hypertension: Secondary | ICD-10-CM

## 2021-03-16 NOTE — Telephone Encounter (Signed)
Requested Prescriptions  Pending Prescriptions Disp Refills  . metoprolol succinate (TOPROL-XL) 25 MG 24 hr tablet [Pharmacy Med Name: METOPROLOL SUCC ER 25 MG TAB] 90 tablet 0    Sig: TAKE 1 TABLET BY MOUTH EVERY DAY     Cardiovascular:  Beta Blockers Failed - 03/16/2021  1:23 AM      Failed - Last BP in normal range    BP Readings from Last 1 Encounters:  11/30/20 (!) 154/84         Passed - Last Heart Rate in normal range    Pulse Readings from Last 1 Encounters:  11/30/20 69         Passed - Valid encounter within last 6 months    Recent Outpatient Visits          5 months ago Lower urinary tract symptoms   [place], Charlane Ferretti, MD   1 year ago Sick sinus syndrome [facility])   [place], [place], MD   1 year ago Chronic diastolic congestive heart failure [facility])   [place] Charlott Rakes, MD   2 years ago [place], [place]   2 years ago Other emphysema [facility])   [place], MD      Future Appointments            In 6 days Charlott Rakes, MD [place]           . furosemide (LASIX) 20 MG tablet [Pharmacy Med Name: FUROSEMIDE 20 MG TABLET] 90 tablet 1    Sig: TAKE 1 TABLET BY MOUTH EVERY DAY     Cardiovascular:  Diuretics - Loop Failed - 03/16/2021  1:23 AM      Failed - Cr in normal range and within 360 days    Creat  Date Value Ref Range Status  03/12/2015 1.06 0.50 - 1.35 mg/dL Final   Creatinine, Ser  Date Value Ref Range Status  09/22/2020 1.40 (H) 0.76 - 1.27 mg/dL Final   Creatinine,U  Date Value Ref Range Status  01/13/2011 148.1 mg/dL Final    Comment:    See lab report for associated comment(s)         Failed - Last BP in normal range    BP Readings from Last 1 Encounters:  11/30/20 (!) 154/84          Passed - K in normal range and within 360 days    Potassium  Date Value Ref Range Status  09/22/2020 4.0 3.5 - 5.2 mmol/L Final         Passed - Ca in normal range and within 360 days    Calcium  Date Value Ref Range Status  09/22/2020 9.6 8.6 - 10.2 mg/dL Final   Calcium, Ion  Date Value Ref Range Status  12/08/2015 1.01 (L) 1.13 - 1.30 mmol/L Final         Passed - Na in normal range and within 360 days    Sodium  Date Value Ref Range Status  09/22/2020 137 134 - 144 mmol/L Final         Passed - Valid encounter within last 6 months    Recent Outpatient Visits          5 months ago Lower urinary tract symptoms   [place], [place],  MD   1 year ago Sick sinus syndrome [facility])   [place], [place], MD   1 year ago Chronic diastolic congestive heart failure [facility])   [place] Charlott Rakes, MD   2 years ago [place], [place]   2 years ago Other emphysema [facility])   [place], MD      Future Appointments            In 6 days Charlott Rakes, MD [place]

## 2021-03-21 ENCOUNTER — Ambulatory Visit: Payer: Medicare HMO | Admitting: Critical Care Medicine

## 2021-03-22 ENCOUNTER — Ambulatory Visit: Payer: Medicare HMO | Admitting: Family Medicine

## 2021-03-29 ENCOUNTER — Other Ambulatory Visit: Payer: Self-pay | Admitting: Family Medicine

## 2021-03-29 DIAGNOSIS — I5032 Chronic diastolic (congestive) heart failure: Secondary | ICD-10-CM

## 2021-04-29 ENCOUNTER — Ambulatory Visit: Payer: Self-pay | Admitting: *Deleted

## 2021-04-29 NOTE — Telephone Encounter (Signed)
C/o increasing shortness of breath and sleeping more than normal and tired. C/o not feeling well and when walking 2 blocks a days for exercise has noticed increase in shortness of breath and breathing is labored, to the point is required to stop and then start again. Denies chest pain , or difficulty breathing, just short of breath until it passes. Denies fever, swelling in feet , legs or hands. Shortness of breath comes and goes. Denies dizziness  Or lightheadedness. Reports tremor in right hand becoming more noticeable when picking up "glass of milk" . Denies shortness of breath or distress at this time . Reports he wants to sleep all day and it is becoming worse. No available appt today earliest appt 05/10/21. Encouraged patient to go to UC if symptoms returned. Care advise given. Patient verbalized understanding of care advise and to call back or go to [facility] or ED if symptoms worsen.    Reason for Disposition . [1] Longstanding difficulty breathing (e.g., CHF, COPD, emphysema) AND [2] WORSE than normal  Answer Assessment - Initial Assessment Questions 1. RESPIRATORY STATUS: "Describe your breathing?" (e.g., wheezing, shortness of breath, unable to speak, severe coughing)      Ok now . But shortness of breath noted more often with mobility  2. ONSET: "When did this breathing problem begin?"      No specific time just has become more often and worsening  3. PATTERN "Does the difficult breathing come and go, or has it been constant since it started?"      Comes and goes  4. SEVERITY: "How bad is your breathing?" (e.g., mild, moderate, severe)    - MILD: No SOB at rest, mild SOB with walking, speaks normally in sentences, can lie down, no retractions, pulse < 100.    - MODERATE: SOB at rest, SOB with minimal exertion and prefers to sit, cannot lie down flat, speaks in phrases, mild retractions, audible wheezing, pulse 100-120.    - SEVERE: Very SOB at rest, speaks in single words, struggling to breathe,  sitting hunched forward, retractions, pulse > 120      Mild  5. RECURRENT SYMPTOM: "Have you had difficulty breathing before?" If Yes, ask: "When was the last time?" and "What happened that time?"      Yes, hx asthma, CHF 6. CARDIAC HISTORY: "Do you have any history of heart disease?" (e.g., heart attack, angina, bypass surgery, angioplasty)      CHF  7. LUNG HISTORY: "Do you have any history of lung disease?"  (e.g., pulmonary embolus, asthma, emphysema)     asthma 8. CAUSE: "What do you think is causing the breathing problem?"      Not sure maybe worsening CHF  9. OTHER SYMPTOMS: "Do you have any other symptoms? (e.g., dizziness, runny nose, cough, chest pain, fever)     increased sleeping and more tired than normal  10. O2 SATURATION MONITOR:  "Do you use an oxygen saturation monitor (pulse oximeter) at home?" If Yes, "What is your reading (oxygen level) today?" "What is your usual oxygen saturation reading?" (e.g., 95%)       na 11. PREGNANCY: "Is there any chance you are pregnant?" "When was your last menstrual period?"       na 12. TRAVEL: "Have you traveled out of the country in the last month?" (e.g., travel history, exposures)       na  Protocols used: BREATHING DIFFICULTY-A-AH

## 2021-04-29 NOTE — Telephone Encounter (Signed)
Pt has upcoming appointment with Dr.Wright to discuss.

## 2021-05-05 DIAGNOSIS — L304 Erythema intertrigo: Secondary | ICD-10-CM | POA: Diagnosis not present

## 2021-05-09 ENCOUNTER — Ambulatory Visit: Payer: Medicare HMO | Admitting: Podiatry

## 2021-05-10 ENCOUNTER — Encounter: Payer: Self-pay | Admitting: Critical Care Medicine

## 2021-05-10 ENCOUNTER — Other Ambulatory Visit: Payer: Self-pay

## 2021-05-10 ENCOUNTER — Ambulatory Visit: Payer: Medicare HMO | Attending: Family Medicine | Admitting: Critical Care Medicine

## 2021-05-10 VITALS — BP 135/77 | HR 56 | Ht 67.0 in | Wt 246.0 lb

## 2021-05-10 DIAGNOSIS — J438 Other emphysema: Secondary | ICD-10-CM | POA: Diagnosis not present

## 2021-05-10 DIAGNOSIS — I5032 Chronic diastolic (congestive) heart failure: Secondary | ICD-10-CM

## 2021-05-10 DIAGNOSIS — G4733 Obstructive sleep apnea (adult) (pediatric): Secondary | ICD-10-CM | POA: Diagnosis not present

## 2021-05-10 DIAGNOSIS — I495 Sick sinus syndrome: Secondary | ICD-10-CM | POA: Diagnosis not present

## 2021-05-10 DIAGNOSIS — R569 Unspecified convulsions: Secondary | ICD-10-CM

## 2021-05-10 NOTE — Patient Instructions (Signed)
Referral back to Dr. Rexene Alberts of neurology for your sleep disorder was made  Referral to have you get another split-night sleep study was ordered this would be at the [facility] neurology sleep center  Keep follow-up appointments with your primary care provider Dr. Margarita Rana

## 2021-05-10 NOTE — Assessment & Plan Note (Signed)
Concerns sleep apnea is playing a role here will repeat sleep studies with split-night approach and refer back to sleep medicine

## 2021-05-10 NOTE — Assessment & Plan Note (Signed)
No evidence of COPD or asthma playing a role

## 2021-05-10 NOTE — Progress Notes (Signed)
Constant ringing in ears. States that he is sleeping too much. Having pain in left leg.

## 2021-05-10 NOTE — Assessment & Plan Note (Signed)
No evidence heart failure playing a role no change in medicines

## 2021-05-10 NOTE — Progress Notes (Signed)
Subjective:    Patient ID: [name] [name], male    DOB: [birth year], 70 y.o.   MRN: [id number]  70 y.o.M PCP [place] ref for abn sleep patterns.  05/10/2021 This is a essentially pulmonary consultation.  The patient has had progression in his symptoms of shortness of breath with exertion over the past several months.  Below is documentation of his current symptom complex C/o increasing shortness of breath and sleeping more than normal and tired. C/o not feeling well and when walking 2 blocks a days for exercise has noticed increase in shortness of breath and breathing is labored, to the point is required to stop and then start again. Denies chest pain , or difficulty breathing, just short of breath until it passes. Denies fever, swelling in feet , legs or hands. Shortness of breath comes and goes. Denies dizziness  Or lightheadedness. Reports tremor in right hand becoming more noticeable when picking up "glass of milk" . Denies shortness of breath or distress at this time . Reports he wants to sleep all day and it is becoming worse.   Patient is worked in for Dr. Lucia Gaskins for evaluation from a primary care perspective.  His weight is up.  He has had previous evaluations for sleep apnea showing mild to moderate sleep apnea treated with 6 cm of nasal mask CPAP therapy in 2017.  He is not been using the mask of the last 3 years.  He states he goes to bed at 10 PM awakens at 4:30 AM and has 2 different episodes of nocturia in between.  He wakes up tired and fatigued fatigued he does have excess snoring.  He does fall asleep watching TV but not driving motor vehicles.  He states no change in memory but does have decreased concentration.  He has some wheezing and shortness of breath does have the asthma.  Also history of coronary disease and atrial fibrillation he is now on amiodarone and is followed closely by cardiology.  Review shortness of breath assessment below  Shortness of Breath This is a chronic  problem. The current episode started more than 1 year ago. The problem occurs daily (walking one block). The problem has been unchanged. Associated symptoms include leg swelling and wheezing. Pertinent negatives include no chest pain, claudication, coryza, ear pain, headaches, hemoptysis, orthopnea, PND, rhinorrhea, sore throat, sputum production, syncope or vomiting. The symptoms are aggravated by any activity and eating. Associated symptoms comments: tinitits left ear, no hearing loss nocturia. He has tried beta agonist inhalers and ipratropium inhalers for the symptoms. The treatment provided moderate relief. His past medical history is significant for asthma and a heart failure.   Past Medical History:  Diagnosis Date  . Anemia   . Asthma   . Atrial fibrillation ([place])   . CHF (congestive heart failure) ([place])   . COPD (chronic obstructive pulmonary disease) ([place])   . Heart disease   . Hypertension   . Syncope      Family History  Problem Relation Age of Onset  . Cancer Mother      Social History   Socioeconomic History  . Marital status: Single    Spouse name: Not on file  . Number of children: 1  . Years of education: college  . Highest education level: Not on file  Occupational History  . 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 Smoking status: Never Smoker  . Smokeless tobacco: Never Used  Vaping Use  .  Vaping Use: Never used  Substance and Sexual Activity  . Alcohol use: No    Alcohol/week: 0.0 standard drinks  . Drug use: No  . Sexual activity: Not Currently  Other Topics Concern  . Not on file  Social History Narrative   Patient lives at home alone and he is single.   Patient is semi retired.    Education college.   Right handed.   Caffeine two cokes daily.   Social Determinants of Health   Financial Resource Strain: Not on file  Food Insecurity: Not on file  Transportation Needs: Not on file  Physical Activity: Not on file   Stress: Not on file  Social Connections: Not on file  Intimate Partner Violence: Not on file     No Known Allergies   Outpatient Medications Prior to Visit  Medication Sig Dispense Refill  . acetaminophen (TYLENOL) 325 MG tablet Take 2 tablets (650 mg total) by mouth every 6 (six) hours as needed for mild pain (or Fever >/= 101). 30 tablet 0  . albuterol (PROVENTIL HFA;VENTOLIN HFA) 108 (90 Base) MCG/ACT inhaler Inhale 2 puffs into the lungs every 6 (six) hours as needed. 1 Inhaler 5  . albuterol (PROVENTIL) (2.5 MG/3ML) 0.083% nebulizer solution Take 3 mLs (2.5 mg total) by nebulization every 6 (six) hours as needed for wheezing or shortness of breath. 150 mL 1  . allopurinol (ZYLOPRIM) 300 MG tablet Take 1 tablet (300 mg total) by mouth daily. 90 tablet 1  . amiodarone (PACERONE) 200 MG tablet Take 1 tablet (200 mg total) by mouth 2 (two) times daily. 60 tablet 2  . ARNUITY ELLIPTA 100 MCG/ACT AEPB TAKE 1 PUFF BY MOUTH EVERY DAY 30 each 0  . ARNUITY ELLIPTA 100 MCG/ACT AEPB Inhale 1 puff into the lungs daily. 30 each 3  . aspirin EC 81 MG tablet Take 81 mg by mouth daily.    Marland Kitchen atorvastatin (LIPITOR) 40 MG tablet TAKE 1 TABLET (40 MG TOTAL) BY MOUTH DAILY AT 6 PM. 90 tablet 0  . clomiPHENE (CLOMID) 50 MG tablet 1/4 tab daily    . clotrimazole-betamethasone (LOTRISONE) cream Apply 1 application topically 2 (two) times daily as needed (FOR RASH).    Marland Kitchen digoxin (LANOXIN) 0.125 MG tablet Take 1 tablet (0.125 mg total) by mouth daily. 30 tablet 3  . ferrous sulfate 325 (65 FE) MG tablet Take 325 mg by mouth daily with breakfast. Reported on 03/16/2016    . furosemide (LASIX) 20 MG tablet TAKE 1 TABLET BY MOUTH EVERY DAY 90 tablet 0  . losartan (COZAAR) 100 MG tablet Take 100 mg by mouth daily.  3  . meclizine (ANTIVERT) 25 MG tablet Take 1 tablet (25 mg total) by mouth 3 (three) times daily as needed for dizziness. 30 tablet 0  . methocarbamol (ROBAXIN) 500 MG tablet TAKE 1 TABLET (500 MG TOTAL)  BY MOUTH EVERY 8 (EIGHT) HOURS AS NEEDED FOR MUSCLE SPASM/PAIN 90 tablet 0  . metoprolol succinate (TOPROL-XL) 25 MG 24 hr tablet TAKE 1 TABLET BY MOUTH EVERY DAY 90 tablet 0  . nitroGLYCERIN (NITROSTAT) 0.4 MG SL tablet Place 1 tablet (0.4 mg total) under the tongue every 5 (five) minutes as needed for chest pain. 25 tablet 1  . pantoprazole (PROTONIX) 40 MG tablet TAKE 1 TABLET BY MOUTH EVERY DAY 90 tablet 0  . potassium chloride (K-DUR) 10 MEQ tablet Take 10 mEq by mouth daily.    . sildenafil (VIAGRA) 100 MG tablet Take 1 tablet (100 mg  total) by mouth daily as needed for erectile dysfunction. 10 tablet 0  . tamoxifen (NOLVADEX) 10 MG tablet Take 1 tablet (10 mg total) by mouth 2 (two) times daily. 180 tablet 3  . tamsulosin (FLOMAX) 0.4 MG CAPS capsule TAKE 1 CAPSULE BY MOUTH EVERY DAY 30 capsule 0  . triazolam (HALCION) 0.25 MG tablet Take 1 tab by mouth 1 hour prior to procedure, do not drive motor vehicle. 2 tablet 0  . UNABLE TO FIND Outpatient PT, OT  Diagnosis: Shoulder dislocation, syncope 1 Mutually Defined 0   No facility-administered medications prior to visit.     Review of Systems  HENT: Negative for ear pain, rhinorrhea and sore throat.   Respiratory: Positive for shortness of breath and wheezing. Negative for hemoptysis and sputum production.   Cardiovascular: Positive for leg swelling. Negative for chest pain, orthopnea, claudication, syncope and PND.  Gastrointestinal: Negative for vomiting.  Neurological: Negative for headaches.       Objective:   Physical Exam Vitals:   05/10/21 1056  BP: 135/77  Pulse: (!) 56  SpO2: 100%  Weight: 246 lb (111.6 kg)  Height: 5\' 7"  (1.702 m)    Gen: Pleasant, obese, in no distress,  normal affect  ENT: No lesions,  mouth clear,  oropharynx clear, no postnasal drip  Neck: No JVD, no TMG, no carotid bruits  Lungs: No use of accessory muscles, no dullness to percussion, clear without rales or rhonchi  Cardiovascular:  RRR, heart sounds normal, no murmur or gallops, no peripheral edema  Abdomen: soft and NT, no HSM,  BS normal  Musculoskeletal: No deformities, no cyanosis or clubbing  Neuro: alert, non focal  Skin: Warm, no lesions or rashes  Sleep studies from 2018 reviewed       Assessment & Plan:  I personally reviewed all images and lab data in the [facility] system as well as any outside material available during this office visit and agree with the  radiology impressions.   CHF (congestive heart failure) (HCC) No evidence heart failure playing a role no change in medicines  Sleep apnea Concerns sleep apnea is playing a role here will repeat sleep studies with split-night approach and refer back to sleep medicine  COPD (chronic obstructive pulmonary disease) (HCC) No evidence of COPD or asthma playing a role   Caison was seen today for insomnia.  Diagnoses and all orders for this visit:  Obstructive sleep apnea syndrome -     Split night study; Future -     Ambulatory referral to Neurology  Other emphysema [facility])  Chronic diastolic congestive heart failure (HCC)  Sick sinus syndrome ([place])  Morbid obesity, unspecified obesity type ([place])  Seizures ([place])

## 2021-05-18 DIAGNOSIS — R7303 Prediabetes: Secondary | ICD-10-CM | POA: Diagnosis not present

## 2021-05-18 DIAGNOSIS — I2510 Atherosclerotic heart disease of native coronary artery without angina pectoris: Secondary | ICD-10-CM | POA: Diagnosis not present

## 2021-05-18 DIAGNOSIS — I10 Essential (primary) hypertension: Secondary | ICD-10-CM | POA: Diagnosis not present

## 2021-05-18 DIAGNOSIS — J45909 Unspecified asthma, uncomplicated: Secondary | ICD-10-CM | POA: Diagnosis not present

## 2021-05-18 DIAGNOSIS — E785 Hyperlipidemia, unspecified: Secondary | ICD-10-CM | POA: Diagnosis not present

## 2021-05-25 ENCOUNTER — Other Ambulatory Visit: Payer: Self-pay | Admitting: Family Medicine

## 2021-05-25 DIAGNOSIS — K219 Gastro-esophageal reflux disease without esophagitis: Secondary | ICD-10-CM

## 2021-05-25 NOTE — Telephone Encounter (Signed)
Requested Prescriptions  Pending Prescriptions Disp Refills  . pantoprazole (PROTONIX) 40 MG tablet [Pharmacy Med Name: PANTOPRAZOLE SOD DR 40 MG TAB] 90 tablet 0    Sig: TAKE 1 TABLET BY MOUTH EVERY DAY     Gastroenterology: Proton Pump Inhibitors Passed - 05/25/2021  1:37 AM      Passed - Valid encounter within last 12 months    Recent Outpatient Visits          2 weeks ago Obstructive sleep apnea syndrome   [place] Elsie Stain, MD   8 months ago Lower urinary tract symptoms   [place] Charlott Rakes, MD   1 year ago Sick sinus syndrome [facility])   [place], Charlane Ferretti, MD   2 years ago Chronic diastolic congestive heart failure [facility])   [place] Charlott Rakes, MD   2 years ago [place], Dionne Bucy, [place]      Future Appointments            In 1 month Charlott Rakes, MD [place]

## 2021-05-30 ENCOUNTER — Ambulatory Visit: Payer: Medicare HMO | Admitting: Podiatry

## 2021-06-13 ENCOUNTER — Other Ambulatory Visit: Payer: Self-pay

## 2021-06-13 ENCOUNTER — Ambulatory Visit (INDEPENDENT_AMBULATORY_CARE_PROVIDER_SITE_OTHER): Payer: Medicare HMO | Admitting: Podiatry

## 2021-06-13 DIAGNOSIS — M79675 Pain in left toe(s): Secondary | ICD-10-CM

## 2021-06-13 DIAGNOSIS — B351 Tinea unguium: Secondary | ICD-10-CM | POA: Diagnosis not present

## 2021-06-13 DIAGNOSIS — M79674 Pain in right toe(s): Secondary | ICD-10-CM | POA: Diagnosis not present

## 2021-06-13 DIAGNOSIS — L989 Disorder of the skin and subcutaneous tissue, unspecified: Secondary | ICD-10-CM

## 2021-06-13 NOTE — Progress Notes (Signed)
   SUBJECTIVE Patient presents to office today complaining of elongated, thickened nails that cause pain while ambulating in shoes.  He  is unable to trim his own nails. Patient is here for further evaluation and treatment.  Past Medical History:  Diagnosis Date   Anemia    Asthma    Atrial fibrillation (HCC)    CHF (congestive heart failure) (HCC)    COPD (chronic obstructive pulmonary disease) ([place])    Heart disease    Hypertension    Syncope     OBJECTIVE General Patient is awake, alert, and oriented x 3 and in no acute distress. Derm Skin is dry and supple bilateral. Negative open lesions or macerations. Remaining integument unremarkable. Nails are tender, long, thickened and dystrophic with subungual debris, consistent with onychomycosis, 1-5 bilateral. No signs of infection noted.  Hyperkeratotic preulcerative callus tissue also noted to the distal tips of the toes bilateral Vasc  DP and PT pedal pulses palpable bilaterally. Temperature gradient within normal limits.  Neuro Epicritic and protective threshold sensation grossly intact bilaterally.  Musculoskeletal Exam No symptomatic pedal deformities noted bilateral. Muscular strength within normal limits.  ASSESSMENT 1. Onychodystrophic nails 1-5 bilateral with hyperkeratosis of nails.  2. Onychomycosis of nail due to dermatophyte bilateral 3. Pain in foot bilateral 4.  Preulcerative calluses bilateral feet  PLAN OF CARE 1. Patient evaluated today.  2. Instructed to maintain good pedal hygiene and foot care.  3. Mechanical debridement of nails 1-5 bilaterally performed using a nail nipper. Filed with dremel without incident.  4.  Excisional debridement of the hyperkeratotic callus tissue was performed using a tissue nipper without incident or bleeding  5.  Return to clinic in 3 mos. for routine foot care   Edrick Kins, DPM [facility]  Dr. Edrick Kins, DPM    [address], [place] [address]                Office [phone]  Fax [phone]

## 2021-06-17 ENCOUNTER — Other Ambulatory Visit: Payer: Self-pay | Admitting: Family Medicine

## 2021-06-17 DIAGNOSIS — I10 Essential (primary) hypertension: Secondary | ICD-10-CM

## 2021-06-17 DIAGNOSIS — I5032 Chronic diastolic (congestive) heart failure: Secondary | ICD-10-CM

## 2021-06-23 ENCOUNTER — Other Ambulatory Visit: Payer: Self-pay | Admitting: Family Medicine

## 2021-06-23 DIAGNOSIS — I5032 Chronic diastolic (congestive) heart failure: Secondary | ICD-10-CM

## 2021-07-05 DIAGNOSIS — L304 Erythema intertrigo: Secondary | ICD-10-CM | POA: Diagnosis not present

## 2021-07-11 ENCOUNTER — Other Ambulatory Visit: Payer: Self-pay

## 2021-07-11 ENCOUNTER — Encounter: Payer: Self-pay | Admitting: Family Medicine

## 2021-07-11 ENCOUNTER — Ambulatory Visit: Payer: Medicare HMO | Attending: Family Medicine | Admitting: Family Medicine

## 2021-07-11 VITALS — BP 129/71 | HR 88 | Ht 67.0 in | Wt 244.0 lb

## 2021-07-11 DIAGNOSIS — Z1211 Encounter for screening for malignant neoplasm of colon: Secondary | ICD-10-CM

## 2021-07-11 DIAGNOSIS — R7303 Prediabetes: Secondary | ICD-10-CM

## 2021-07-11 DIAGNOSIS — I110 Hypertensive heart disease with heart failure: Secondary | ICD-10-CM

## 2021-07-11 DIAGNOSIS — I5032 Chronic diastolic (congestive) heart failure: Secondary | ICD-10-CM

## 2021-07-11 DIAGNOSIS — H9311 Tinnitus, right ear: Secondary | ICD-10-CM

## 2021-07-11 DIAGNOSIS — M1A0710 Idiopathic chronic gout, right ankle and foot, without tophus (tophi): Secondary | ICD-10-CM

## 2021-07-11 DIAGNOSIS — R399 Unspecified symptoms and signs involving the genitourinary system: Secondary | ICD-10-CM

## 2021-07-11 DIAGNOSIS — M1712 Unilateral primary osteoarthritis, left knee: Secondary | ICD-10-CM | POA: Diagnosis not present

## 2021-07-11 MED ORDER — TAMSULOSIN HCL 0.4 MG PO CAPS: 0.4000 mg | ORAL_CAPSULE | Freq: Every day | ORAL | 1 refills | Status: DC

## 2021-07-11 MED ORDER — DICLOFENAC SODIUM 1 % EX GEL: 4.0000 g | Freq: Four times a day (QID) | CUTANEOUS | 1 refills | Status: DC

## 2021-07-11 MED ORDER — ALLOPURINOL 300 MG PO TABS
300.0000 mg | ORAL_TABLET | Freq: Every day | ORAL | 1 refills | Status: DC
Start: 2021-07-11 — End: 2022-01-11

## 2021-07-11 NOTE — Progress Notes (Signed)
Subjective:  Patient ID: [name] [name], male    DOB: 10-Aug-1951  Age: 70 y.o. MRN: [id number]  CC: Hypertension   HPI [name] [name]  is a 70 year old male with a history of paroxysmal A. fib, atrial flutter, diastolic congestive heart failure (EF 55-60% in 11/2017), CAD (status post cardiac cath in 11/2015 - mid LAD 20% stenosis, dist LAD 40% stenosis, Ost RCA to prox RCA 30% stenosis, normal LV systolic function), sick sinus syndrome, COPD, GERD, seizures (managed by neurology), BPH, hypogonadism (managed by endocrine),Gout here for a follow up visit.  Interval History: He hears a constant buzz in his R ear and noticed he is more somnolent.  Did have a previous history of vertigo for which he was on meclizine for short while in 2020.  Denies presence of otalgia or sinus symptoms. The medial aspect of his L knee has been sore rated as a 3-4/10 not severe he states but affects him when he tries to move and is more noticeable on attempting to get into the car.  Pain is absent at the moment. Denies recent gout flares.  He is not using his CPAP as it kept malfunctioning and so he no longer uses it.  He does get a good nights rest but states he has to consciously stay awake during the day.  He is awaiting an appointment with the Sleep Specialist with [facility] [place]  His blood pressure is controlled and he is doing well on his medications.  He does have chronic dyspnea on moderate exertion and does have mild ankle edema.  This is his baseline and he follows closely with his cardiologist Dr. Terrence Dupont. His lower urinary tract symptoms are stable. Past Medical History:  Diagnosis Date   Anemia    Asthma    Atrial fibrillation (HCC)    CHF (congestive heart failure) (HCC)    COPD (chronic obstructive pulmonary disease) ([place])    Heart disease    Hypertension    Syncope     Past Surgical History:  Procedure Laterality Date   CARDIAC CATHETERIZATION N/A 12/08/2015   Procedure: Left Heart Cath and Coronary  Angiography;  Surgeon: Charolette Forward, MD;  Location: [facility];  Service: Cardiovascular;  Laterality: N/A;   ESOPHAGUS SURGERY     SHOULDER CLOSED REDUCTION Left 12/01/2017   Procedure: CLOSED REDUCTION SHOULDER;  Surgeon: Altamese Keenes, MD;  Location: [facility];  Service: Orthopedics;  Laterality: Left;    Family History  Problem Relation Age of Onset   Cancer Mother     No Known Allergies  Outpatient Medications Prior to Visit  Medication Sig Dispense Refill   acetaminophen (TYLENOL) 325 MG tablet Take 2 tablets (650 mg total) by mouth every 6 (six) hours as needed for mild pain (or Fever >/= 101). 30 tablet 0   albuterol (PROVENTIL HFA;VENTOLIN HFA) 108 (90 Base) MCG/ACT inhaler Inhale 2 puffs into the lungs every 6 (six) hours as needed. 1 Inhaler 5   albuterol (PROVENTIL) (2.5 MG/3ML) 0.083% nebulizer solution Take 3 mLs (2.5 mg total) by nebulization every 6 (six) hours as needed for wheezing or shortness of breath. 150 mL 1   amiodarone (PACERONE) 200 MG tablet Take 1 tablet (200 mg total) by mouth 2 (two) times daily. 60 tablet 2   ARNUITY ELLIPTA 100 MCG/ACT AEPB TAKE 1 PUFF BY MOUTH EVERY DAY 30 each 0   ARNUITY ELLIPTA 100 MCG/ACT AEPB Inhale 1 puff into the lungs daily. 30 each 3   aspirin EC 81  MG tablet Take 81 mg by mouth daily.     atorvastatin (LIPITOR) 40 MG tablet TAKE 1 TABLET BY MOUTH DAILY AT 6 PM. 90 tablet 0   clomiPHENE (CLOMID) 50 MG tablet 1/4 tab daily     clotrimazole-betamethasone (LOTRISONE) cream Apply 1 application topically 2 (two) times daily as needed (FOR RASH).     digoxin (LANOXIN) 0.125 MG tablet Take 1 tablet (0.125 mg total) by mouth daily. 30 tablet 3   ferrous sulfate 325 (65 FE) MG tablet Take 325 mg by mouth daily with breakfast. Reported on 03/16/2016     furosemide (LASIX) 20 MG tablet TAKE 1 TABLET BY MOUTH EVERY DAY 90 tablet 0   losartan (COZAAR) 100 MG tablet Take 100 mg by mouth daily.  3   methocarbamol (ROBAXIN) 500 MG tablet  TAKE 1 TABLET (500 MG TOTAL) BY MOUTH EVERY 8 (EIGHT) HOURS AS NEEDED FOR MUSCLE SPASM/PAIN 90 tablet 0   metoprolol succinate (TOPROL-XL) 25 MG 24 hr tablet TAKE 1 TABLET BY MOUTH EVERY DAY 90 tablet 0   nitroGLYCERIN (NITROSTAT) 0.4 MG SL tablet Place 1 tablet (0.4 mg total) under the tongue every 5 (five) minutes as needed for chest pain. 25 tablet 1   pantoprazole (PROTONIX) 40 MG tablet TAKE 1 TABLET BY MOUTH EVERY DAY 90 tablet 0   potassium chloride (K-DUR) 10 MEQ tablet Take 10 mEq by mouth daily.     sildenafil (VIAGRA) 100 MG tablet Take 1 tablet (100 mg total) by mouth daily as needed for erectile dysfunction. 10 tablet 0   tamoxifen (NOLVADEX) 10 MG tablet Take 1 tablet (10 mg total) by mouth 2 (two) times daily. 180 tablet 3   allopurinol (ZYLOPRIM) 300 MG tablet Take 1 tablet (300 mg total) by mouth daily. 90 tablet 1   meclizine (ANTIVERT) 25 MG tablet Take 1 tablet (25 mg total) by mouth 3 (three) times daily as needed for dizziness. 30 tablet 0   tamsulosin (FLOMAX) 0.4 MG CAPS capsule TAKE 1 CAPSULE BY MOUTH EVERY DAY 30 capsule 0   No facility-administered medications prior to visit.     ROS Review of Systems  Constitutional:  Negative for activity change and appetite change.  HENT:  Positive for tinnitus. Negative for sinus pressure and sore throat.   Eyes:  Negative for visual disturbance.  Respiratory:  Positive for shortness of breath (at baseline). Negative for cough and chest tightness.   Cardiovascular:  Negative for chest pain and leg swelling.  Gastrointestinal:  Negative for abdominal distention, abdominal pain, constipation and diarrhea.  Endocrine: Negative.   Genitourinary:  Negative for dysuria.  Musculoskeletal:  Negative for joint swelling and myalgias.       See HPI  Skin:  Negative for rash.  Allergic/Immunologic: Negative.   Neurological:  Negative for weakness, light-headedness and numbness.  Psychiatric/Behavioral:  Negative for dysphoric mood and  suicidal ideas.    Objective:  BP 129/71   Pulse 88   Ht 5' 7" (1.702 m)   Wt 244 lb (110.7 kg)   SpO2 97%   BMI 38.22 kg/m   BP/Weight 07/11/2021 05/10/2021 15/06/2619  Systolic BP 355 974 163  Diastolic BP 71 77 84  Wt. (Lbs) 244 246 248.6  BMI 38.22 38.53 38.94      Physical Exam Constitutional:      Appearance: He is well-developed.  HENT:     Right Ear: Tympanic membrane, ear canal and external ear normal.     Left Ear: Tympanic membrane, ear  canal and external ear normal.  Neck:     Vascular: No JVD.  Cardiovascular:     Rate and Rhythm: Normal rate.     Heart sounds: Normal heart sounds. No murmur heard. Pulmonary:     Effort: Pulmonary effort is normal.     Breath sounds: Normal breath sounds. No wheezing or rales.  Chest:     Chest wall: No tenderness.  Abdominal:     General: Bowel sounds are normal. There is no distension.     Palpations: Abdomen is soft. There is no mass.     Tenderness: There is no abdominal tenderness.  Musculoskeletal:        General: Normal range of motion.     Right lower leg: Edema (1+ non pitting) present.     Left lower leg: Edema (1+ non pitting) present.  Neurological:     Mental Status: He is alert and oriented to person, place, and time.  Psychiatric:        Mood and Affect: Mood normal.    CMP Latest Ref Rng & Units 09/22/2020 09/16/2019 01/20/2019  Glucose 65 - 99 mg/dL 108(H) 91 112(H)  BUN 8 - 27 mg/dL 29(H) 16 11  Creatinine 0.76 - 1.27 mg/dL 1.40(H) 1.45(H) 1.34(H)  Sodium 134 - 144 mmol/L 137 143 139  Potassium 3.5 - 5.2 mmol/L 4.0 4.4 3.8  Chloride 96 - 106 mmol/L 103 106 102  CO2 20 - 29 mmol/L _0 Calcium 8.6 - 10.2 mg/dL 9.6 9.2 8.7(L)  Total Protein 6.0 - 8.5 g/dL 7.7 7.5 -  Total Bilirubin 0.0 - 1.2 mg/dL <0.2 <0.2 -  Alkaline Phos 44 - 121 IU/L 99 104 -  AST 0 - 40 IU/L 25 23 -  ALT 0 - 44 IU/L 14 15 -    Lipid Panel     Component Value Date/Time   CHOL 167 09/22/2020 1135   TRIG 92 09/22/2020  1135   HDL 52 09/22/2020 1135   CHOLHDL 3.2 09/16/2019 0939   CHOLHDL 2.5 12/09/2015 0133   VLDL 11 12/09/2015 0133   LDLCALC 98 09/22/2020 1135    CBC    Component Value Date/Time   WBC 8.3 01/20/2019 0345   RBC 5.22 01/20/2019 0345   HGB 11.0 (L) 01/20/2019 0345   HGB 11.7 (L) 11/19/2017 1138   HCT 37.3 (L) 01/20/2019 0345   HCT 38.1 11/19/2017 1138   PLT 348 01/20/2019 0345   PLT 378 11/19/2017 1138   MCV 71.5 (L) 01/20/2019 0345   MCV 70 (L) 11/19/2017 1138   MCH 21.1 (L) 01/20/2019 0345   MCHC 29.5 (L) 01/20/2019 0345   RDW 15.9 (H) 01/20/2019 0345   RDW 17.2 (H) 11/19/2017 1138   LYMPHSABS 2.1 12/01/2017 2242   LYMPHSABS 3.1 11/19/2017 1138   MONOABS 1.2 (H) 12/01/2017 2242   EOSABS 0.3 12/01/2017 2242   EOSABS 0.5 (H) 11/19/2017 1138   BASOSABS 0.0 12/01/2017 2242   BASOSABS 0.1 11/19/2017 1138    Lab Results  Component Value Date   HGBA1C 5.8 (H) 09/22/2020    Assessment & Plan:  1. Tinnitus of right ear Discussed pathophysiology of tinnitus Advised of using earplugs to decrease intensity  2. Primary osteoarthritis of left knee Described as minimal Discussed role of exercise Placed on topical NSAID - diclofenac Sodium (VOLTAREN) 1 % GEL; Apply 4 g topically 4 (four) times daily.  Dispense: 100 g; Refill: 1  3. Idiopathic chronic gout of right ankle without tophus Stable with  no exacerbations - allopurinol (ZYLOPRIM) 300 MG tablet; Take 1 tablet (300 mg total) by mouth daily.  Dispense: 90 tablet; Refill: 1 - CMP14+EGFR; Future - Lipid panel; Future  4. Lower urinary tract symptoms Controlled - tamsulosin (FLOMAX) 0.4 MG CAPS capsule; Take 1 capsule (0.4 mg total) by mouth daily.  Dispense: 90 capsule; Refill: 1  5. Prediabetes Last A1c was 5.8 We will repeat - Hemoglobin A1c; Future  6. Screening for colon cancer - Ambulatory referral to Gastroenterology  7. Hypertensive heart disease with chronic diastolic congestive heart failure (HCC) EF  of 55-60% NYHA II Euvolemic Continue guideline directed medical therapy Follow-up with cardiology   Meds ordered this encounter  Medications   diclofenac Sodium (VOLTAREN) 1 % GEL    Sig: Apply 4 g topically 4 (four) times daily.    Dispense:  100 g    Refill:  1   allopurinol (ZYLOPRIM) 300 MG tablet    Sig: Take 1 tablet (300 mg total) by mouth daily.    Dispense:  90 tablet    Refill:  1   tamsulosin (FLOMAX) 0.4 MG CAPS capsule    Sig: Take 1 capsule (0.4 mg total) by mouth daily.    Dispense:  90 capsule    Refill:  1    Follow-up: Return in about 6 months (around 01/11/2022) for medical conditions.       Charlott Rakes, MD, FAAFP. [facility] and [facility] [place], [place]   07/11/2021, 12:02 PM

## 2021-07-11 NOTE — Patient Instructions (Signed)
Tinnitus Tinnitus refers to hearing a sound when there is no actual source for that sound. This is often described as ringing in the ears. However, people withthis condition may hear a variety of noises, in one ear or in both ears. The sounds of tinnitus can be soft, loud, or somewhere in between. Tinnitus can last for a few seconds or can be constant for days. It may go away without treatment and come back at various times. When tinnitus is constant or happens often, it can lead to other problems, such as trouble sleeping and troubleconcentrating. Almost everyone experiences tinnitus at some point. Tinnitus is not the same as hearing loss. Tinnitus that is long-lasting (chronic) or comes back often (recurs) may require medical attention. What are the causes? The cause of tinnitus is often not known. In some cases, it can result from: Exposure to loud noises from machinery, music, or other sources. An object (foreign body) stuck in the ear. Earwax buildup. Drinking alcohol or caffeine. Taking certain medicines. Age-related hearing loss. It may also be caused by medical conditions such as: Ear or sinus infections. Heart diseases or high blood pressure. Allergies. Mnire's disease. Thyroid problems. Tumors. A weak, bulging blood vessel (aneurysm) near the ear. What increases the risk? The following factors may make you more likely to develop this condition: Exposure to loud noises. Age. Tinnitus is more likely in older individuals. Using alcohol or tobacco. What are the signs or symptoms? The main symptom of tinnitus is hearing a sound when there is no source for that sound. It may sound like: Buzzing. Sizzling. Ringing. Blowing air. Hissing. Whistling. Other sounds may include: Roaring. Running water. A musical note. Tapping. Humming. Symptoms may affect only one ear (unilateral) or both ears (bilateral). How is this diagnosed? Tinnitus is diagnosed based on your symptoms,  your medical history, and a physical exam. Your health care provider may do a thorough hearing test (audiologic exam) if your tinnitus: Is unilateral. Causes hearing difficulties. Lasts 6 months or longer. You may work with a health care provider who specializes in hearing disorders (audiologist). You may be asked questions about your symptoms and how they affect your daily life. You may have other tests done, such as: CT scan. MRI. An imaging test of how blood flows through your blood vessels (angiogram). How is this treated? Treating an underlying medical condition can sometimes make tinnitus go away. If your tinnitus continues, other treatments may include: Therapy and counseling to help you manage the stress of living with tinnitus. Sound generators to mask the tinnitus. These include: Tabletop sound machines that play relaxing sounds to help you fall asleep. Wearable devices that fit in your ear and play sounds or music. Acoustic neural stimulation. This involves using headphones to listen to music that contains an auditory signal. Over time, listening to this signal may change some pathways in your brain and make you less sensitive to tinnitus. This treatment is used for very severe cases when no other treatment is working. Using hearing aids or cochlear implants if your tinnitus is related to hearing loss. Hearing aids are worn in the outer ear. Cochlear implants are surgically placed in the inner ear. Follow these instructions at home: Managing symptoms     When possible, avoid being in loud places and being exposed to loud sounds. Wear hearing protection, such as earplugs, when you are exposed to loud noises. Use a white noise machine, a humidifier, or other devices to mask the sound of tinnitus. Practice techniques  for reducing stress, such as meditation, yoga, or deep breathing. Work with your health care provider if you need help with managing stress. Sleep with your head  slightly raised. This may reduce the impact of tinnitus. General instructions Do not use stimulants, such as nicotine, alcohol, or caffeine. Talk with your health care provider about other stimulants to avoid. Stimulants are substances that can make you feel alert and attentive by increasing certain activities in the body (such as heart rate and blood pressure). These substances may make tinnitus worse. Take over-the-counter and prescription medicines only as told by your health care provider. Try to get plenty of sleep each night. Keep all follow-up visits. This is important. Contact a health care provider if: Your tinnitus continues for 3 weeks or longer without stopping. You develop sudden hearing loss. Your symptoms get worse or do not get better with home care. You feel you are not able to manage the stress of living with tinnitus. Get help right away if: You develop tinnitus after a head injury. You have tinnitus along with any of the following: Dizziness. Nausea and vomiting. Loss of balance. Sudden, severe headache. Vision changes. Facial weakness or weakness of arms or legs. These symptoms may represent a serious problem that is an emergency. Do not wait to see if the symptoms will go away. Get medical help right away. Call your local emergency services (911 in the [place] Do not drive yourself to the hospital. Summary Tinnitus refers to hearing a sound when there is no actual source for that sound. This is often described as ringing in the ears. Symptoms may affect only one ear (unilateral) or both ears (bilateral). Use a white noise machine, a humidifier, or other devices to mask the sound of tinnitus. Do not use stimulants, such as nicotine, alcohol, or caffeine. These substances may make tinnitus worse. This information is not intended to replace advice given to you by your health care provider. Make sure you discuss any questions you have with your healthcare  provider. Document Revised: 11/15/2020 Document Reviewed: 11/15/2020 Elsevier Patient Education  2022 [employer].

## 2021-07-11 NOTE — Progress Notes (Signed)
Pt is having pain in left knee. Ringing in ears.

## 2021-07-13 ENCOUNTER — Institutional Professional Consult (permissible substitution): Payer: Medicare HMO | Admitting: Neurology

## 2021-07-18 ENCOUNTER — Ambulatory Visit: Payer: Medicare HMO | Attending: Family Medicine

## 2021-07-18 ENCOUNTER — Other Ambulatory Visit: Payer: Self-pay

## 2021-07-18 DIAGNOSIS — I5032 Chronic diastolic (congestive) heart failure: Secondary | ICD-10-CM | POA: Diagnosis not present

## 2021-07-18 DIAGNOSIS — R7303 Prediabetes: Secondary | ICD-10-CM | POA: Diagnosis not present

## 2021-07-18 DIAGNOSIS — M1A0710 Idiopathic chronic gout, right ankle and foot, without tophus (tophi): Secondary | ICD-10-CM

## 2021-07-18 DIAGNOSIS — I110 Hypertensive heart disease with heart failure: Secondary | ICD-10-CM | POA: Diagnosis not present

## 2021-07-19 LAB — CMP14+EGFR
ALT: 12 IU/L (ref 0–44)
AST: 20 IU/L (ref 0–40)
Albumin/Globulin Ratio: 1 — ABNORMAL LOW (ref 1.2–2.2)
Albumin: 3.8 g/dL (ref 3.7–4.7)
Alkaline Phosphatase: 99 IU/L (ref 44–121)
BUN/Creatinine Ratio: 9 — ABNORMAL LOW (ref 10–24)
BUN: 12 mg/dL (ref 8–27)
Bilirubin Total: 0.5 mg/dL (ref 0.0–1.2)
CO2: 26 mmol/L (ref 20–29)
Calcium: 9.5 mg/dL (ref 8.6–10.2)
Chloride: 103 mmol/L (ref 96–106)
Creatinine, Ser: 1.38 mg/dL — ABNORMAL HIGH (ref 0.76–1.27)
Globulin, Total: 3.9 g/dL (ref 1.5–4.5)
Glucose: 83 mg/dL (ref 65–99)
Potassium: 4.1 mmol/L (ref 3.5–5.2)
Sodium: 143 mmol/L (ref 134–144)
Total Protein: 7.7 g/dL (ref 6.0–8.5)
eGFR: 54 mL/min/{1.73_m2} — ABNORMAL LOW (ref >59)

## 2021-07-19 LAB — HEMOGLOBIN A1C
Est. average glucose Bld gHb Est-mCnc: 126 mg/dL
Hgb A1c MFr Bld: 6 % — ABNORMAL HIGH (ref 4.8–5.6)

## 2021-07-19 LAB — LIPID PANEL
Chol/HDL Ratio: 3.6 ratio (ref 0.0–5.0)
Cholesterol, Total: 157 mg/dL (ref 100–199)
HDL: 44 mg/dL (ref >39)
LDL Chol Calc (NIH): 97 mg/dL (ref 0–99)
Triglycerides: 82 mg/dL (ref 0–149)
VLDL Cholesterol Cal: 16 mg/dL (ref 5–40)

## 2021-07-22 ENCOUNTER — Telehealth: Payer: Self-pay

## 2021-07-22 NOTE — Telephone Encounter (Signed)
-----   Message from Charlott Rakes, MD sent at 07/19/2021  8:55 AM EDT ----- Please inform him that his cholesterol is normal, kidney function is stable, A1c is 6.0 up from 5.8 previously.  Advised to continue to work on a diabetic diet and exercise to prevent progression to diabetes mellitus.

## 2021-07-22 NOTE — Telephone Encounter (Signed)
Patient name and DOB has been verified Patient was informed of lab results. Patient had no questions.  

## 2021-08-24 ENCOUNTER — Other Ambulatory Visit: Payer: Self-pay | Admitting: Family Medicine

## 2021-08-24 DIAGNOSIS — J45909 Unspecified asthma, uncomplicated: Secondary | ICD-10-CM | POA: Diagnosis not present

## 2021-08-24 DIAGNOSIS — I2510 Atherosclerotic heart disease of native coronary artery without angina pectoris: Secondary | ICD-10-CM | POA: Diagnosis not present

## 2021-08-24 DIAGNOSIS — I10 Essential (primary) hypertension: Secondary | ICD-10-CM | POA: Diagnosis not present

## 2021-08-24 DIAGNOSIS — E785 Hyperlipidemia, unspecified: Secondary | ICD-10-CM | POA: Diagnosis not present

## 2021-08-24 DIAGNOSIS — R7303 Prediabetes: Secondary | ICD-10-CM | POA: Diagnosis not present

## 2021-08-24 DIAGNOSIS — K219 Gastro-esophageal reflux disease without esophagitis: Secondary | ICD-10-CM

## 2021-08-24 NOTE — Telephone Encounter (Signed)
Requested Prescriptions  Pending Prescriptions Disp Refills  . pantoprazole (PROTONIX) 40 MG tablet [Pharmacy Med Name: PANTOPRAZOLE SOD DR 40 MG TAB] 90 tablet 0    Sig: TAKE 1 TABLET BY MOUTH EVERY DAY     Gastroenterology: Proton Pump Inhibitors Passed - 08/24/2021  1:24 AM      Passed - Valid encounter within last 12 months    Recent Outpatient Visits          1 month ago Tinnitus of right ear   [place], Charlane Ferretti, MD   3 months ago Obstructive sleep apnea syndrome   [place] Elsie Stain, MD   11 months ago Lower urinary tract symptoms   [place] Charlott Rakes, MD   1 year ago Sick sinus syndrome [facility])   [place], Charlane Ferretti, MD   2 years ago Chronic diastolic congestive heart failure [facility])   [place], [place], MD      Future Appointments            In 4 months Charlott Rakes, MD [place]

## 2021-08-27 ENCOUNTER — Ambulatory Visit (HOSPITAL_BASED_OUTPATIENT_CLINIC_OR_DEPARTMENT_OTHER): Payer: Medicare HMO

## 2021-08-27 DIAGNOSIS — Z0000 Encounter for general adult medical examination without abnormal findings: Secondary | ICD-10-CM | POA: Diagnosis not present

## 2021-08-27 NOTE — Patient Instructions (Signed)
Health Maintenance, Male Adopting a healthy lifestyle and getting preventive care are important in promoting health and wellness. Ask your health care provider about: The right schedule for you to have regular tests and exams. Things you can do on your own to prevent diseases and keep yourself healthy. What should I know about diet, weight, and exercise? Eat a healthy diet  Eat a diet that includes plenty of vegetables, fruits, low-fat dairy products, and lean protein. Do not eat a lot of foods that are high in solid fats, added sugars, or sodium. Maintain a healthy weight Body mass index (BMI) is a measurement that can be used to identify possible weight problems. It estimates body fat based on height and weight. Your health care provider can help determine your BMI and help you achieve or maintain a healthy weight. Get regular exercise Get regular exercise. This is one of the most important things you can do for your health. Most adults should: Exercise for at least 150 minutes each week. The exercise should increase your heart rate and make you sweat (moderate-intensity exercise). Do strengthening exercises at least twice a week. This is in addition to the moderate-intensity exercise. Spend less time sitting. Even light physical activity can be beneficial. Watch cholesterol and blood lipids Have your blood tested for lipids and cholesterol at 72 years of age, then have this test every 5 years. You may need to have your cholesterol levels checked more often if: Your lipid or cholesterol levels are high. You are older than 72 years of age. You are at high risk for heart disease. What should I know about cancer screening? Many types of cancers can be detected early and may often be prevented. Depending on your health history and family history, you may need to have cancer screening at various ages. This may include screening for: Colorectal cancer. Prostate cancer. Skin cancer. Lung  cancer. What should I know about heart disease, diabetes, and high blood pressure? Blood pressure and heart disease High blood pressure causes heart disease and increases the risk of stroke. This is more likely to develop in people who have high blood pressure readings, are of African descent, or are overweight. Talk with your health care provider about your target blood pressure readings. Have your blood pressure checked: Every 3-5 years if you are 18-39 years of age. Every year if you are 40 years old or older. If you are between the ages of 65 and 75 and are a current or former smoker, ask your health care provider if you should have a one-time screening for abdominal aortic aneurysm (AAA). Diabetes Have regular diabetes screenings. This checks your fasting blood sugar level. Have the screening done: Once every three years after age 45 if you are at a normal weight and have a low risk for diabetes. More often and at a younger age if you are overweight or have a high risk for diabetes. What should I know about preventing infection? Hepatitis B If you have a higher risk for hepatitis B, you should be screened for this virus. Talk with your health care provider to find out if you are at risk for hepatitis B infection. Hepatitis C Blood testing is recommended for: Everyone born from 1945 through 1965. Anyone with known risk factors for hepatitis C. Sexually transmitted infections (STIs) You should be screened each year for STIs, including gonorrhea and chlamydia, if: You are sexually active and are younger than 72 years of age. You are older than 72 years   of age and your health care provider tells you that you are at risk for this type of infection. Your sexual activity has changed since you were last screened, and you are at increased risk for chlamydia or gonorrhea. Ask your health care provider if you are at risk. Ask your health care provider about whether you are at high risk for HIV.  Your health care provider may recommend a prescription medicine to help prevent HIV infection. If you choose to take medicine to prevent HIV, you should first get tested for HIV. You should then be tested every 3 months for as long as you are taking the medicine. Follow these instructions at home: Lifestyle Do not use any products that contain nicotine or tobacco, such as cigarettes, e-cigarettes, and chewing tobacco. If you need help quitting, ask your health care provider. Do not use street drugs. Do not share needles. Ask your health care provider for help if you need support or information about quitting drugs. Alcohol use Do not drink alcohol if your health care provider tells you not to drink. If you drink alcohol: Limit how much you have to 0-2 drinks a day. Be aware of how much alcohol is in your drink. In the [place] one drink equals one 12 oz bottle of beer (355 mL), one 5 oz glass of wine (148 mL), or one 1 oz glass of hard liquor (44 mL). General instructions Schedule regular health, dental, and eye exams. Stay current with your vaccines. Tell your health care provider if: You often feel depressed. You have ever been abused or do not feel safe at home. Summary Adopting a healthy lifestyle and getting preventive care are important in promoting health and wellness. Follow your health care provider's instructions about healthy diet, exercising, and getting tested or screened for diseases. Follow your health care provider's instructions on monitoring your cholesterol and blood pressure. This information is not intended to replace advice given to you by your health care provider. Make sure you discuss any questions you have with your health care provider. Document Revised: 02/18/2021 Document Reviewed: 12/04/2018 Elsevier Patient Education  2022 [employer]  

## 2021-08-27 NOTE — Progress Notes (Signed)
Subjective:   [name] [name] is a 70 y.o. male who presents for an Initial Medicare Annual Wellness Visit.I connected with  Louretta Shorten on 08/27/21 by a audio enabled telemedicine application and verified that I am speaking with the correct person using two identifiers.   I discussed the limitations of evaluation and management by telemedicine. The patient expressed understanding and agreed to proceed. Location of Patient:Home  Location of Provider:Office Person participating: Kimi(patient) Quintella Baton [facility]  Review of Systems    Defer to PCP Cardiac Risk Factors include: Other (see comment), Risk factor comments: afib     Objective:    Today's Vitals   08/27/21 0903  PainSc: 4    There is no height or weight on file to calculate BMI.  Advanced Directives 08/27/2021 01/20/2019 10/17/2018 12/03/2017 08/17/2017 05/17/2017 04/10/2017  Does Patient Have a Medical Advance Directive? No No No No No No No  Would patient like information on creating a medical advance directive? - No - Patient declined No - Patient declined No - Patient declined - - -  Pre-existing out of facility DNR order (yellow form or pink MOST form) - - - - - - -    Current Medications (verified) Outpatient Encounter Medications as of 08/27/2021  Medication Sig   albuterol (PROVENTIL HFA;VENTOLIN HFA) 108 (90 Base) MCG/ACT inhaler Inhale 2 puffs into the lungs every 6 (six) hours as needed.   albuterol (PROVENTIL) (2.5 MG/3ML) 0.083% nebulizer solution Take 3 mLs (2.5 mg total) by nebulization every 6 (six) hours as needed for wheezing or shortness of breath.   allopurinol (ZYLOPRIM) 300 MG tablet Take 1 tablet (300 mg total) by mouth daily.   amiodarone (PACERONE) 200 MG tablet Take 1 tablet (200 mg total) by mouth 2 (two) times daily.   ARNUITY ELLIPTA 100 MCG/ACT AEPB TAKE 1 PUFF BY MOUTH EVERY DAY   ARNUITY ELLIPTA 100 MCG/ACT AEPB Inhale 1 puff into the lungs daily.   aspirin EC 81 MG tablet Take 81 mg by  mouth daily.   atorvastatin (LIPITOR) 40 MG tablet TAKE 1 TABLET BY MOUTH DAILY AT 6 PM.   clomiPHENE (CLOMID) 50 MG tablet 1/4 tab daily   clotrimazole-betamethasone (LOTRISONE) cream Apply 1 application topically 2 (two) times daily as needed (FOR RASH).   diclofenac Sodium (VOLTAREN) 1 % GEL Apply 4 g topically 4 (four) times daily.   digoxin (LANOXIN) 0.125 MG tablet Take 1 tablet (0.125 mg total) by mouth daily.   ferrous sulfate 325 (65 FE) MG tablet Take 325 mg by mouth daily with breakfast. Reported on 03/16/2016   furosemide (LASIX) 20 MG tablet TAKE 1 TABLET BY MOUTH EVERY DAY   losartan (COZAAR) 100 MG tablet Take 100 mg by mouth daily.   metoprolol succinate (TOPROL-XL) 25 MG 24 hr tablet TAKE 1 TABLET BY MOUTH EVERY DAY   nitroGLYCERIN (NITROSTAT) 0.4 MG SL tablet Place 1 tablet (0.4 mg total) under the tongue every 5 (five) minutes as needed for chest pain.   pantoprazole (PROTONIX) 40 MG tablet TAKE 1 TABLET BY MOUTH EVERY DAY   potassium chloride (K-DUR) 10 MEQ tablet Take 10 mEq by mouth daily.   sildenafil (VIAGRA) 100 MG tablet Take 1 tablet (100 mg total) by mouth daily as needed for erectile dysfunction.   tamoxifen (NOLVADEX) 10 MG tablet Take 1 tablet (10 mg total) by mouth 2 (two) times daily.   tamsulosin (FLOMAX) 0.4 MG CAPS capsule Take 1 capsule (0.4 mg total) by mouth daily.  acetaminophen (TYLENOL) 325 MG tablet Take 2 tablets (650 mg total) by mouth every 6 (six) hours as needed for mild pain (or Fever >/= 101). (Patient not taking: Reported on 08/27/2021)   methocarbamol (ROBAXIN) 500 MG tablet TAKE 1 TABLET (500 MG TOTAL) BY MOUTH EVERY 8 (EIGHT) HOURS AS NEEDED FOR MUSCLE SPASM/PAIN (Patient not taking: Reported on 08/27/2021)   No facility-administered encounter medications on file as of 08/27/2021.    Allergies (verified) Patient has no known allergies.   History: Past Medical History:  Diagnosis Date   Anemia    Asthma    Atrial fibrillation (HCC)    CHF  (congestive heart failure) (HCC)    COPD (chronic obstructive pulmonary disease) ([place])    Heart disease    Hypertension    Syncope    Past Surgical History:  Procedure Laterality Date   CARDIAC CATHETERIZATION N/A 12/08/2015   Procedure: Left Heart Cath and Coronary Angiography;  Surgeon: Charolette Forward, MD;  Location: [facility];  Service: Cardiovascular;  Laterality: N/A;   ESOPHAGUS SURGERY     SHOULDER CLOSED REDUCTION Left 12/01/2017   Procedure: CLOSED REDUCTION SHOULDER;  Surgeon: Altamese Lockport, MD;  Location: [facility];  Service: Orthopedics;  Laterality: Left;   Family History  Problem Relation Age of Onset   Cancer Mother    Social History   Socioeconomic History   Marital status: Single    Spouse name: Not on file   Number of children: 1   Years of education: college   Highest education level: Not on file  Occupational History   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    Employer: [employer]  Tobacco Use   Smoking status: Never   Smokeless tobacco: Never  Vaping Use   Vaping Use: Never used  Substance and Sexual Activity   Alcohol use: No    Alcohol/week: 0.0 standard drinks   Drug use: No   Sexual activity: Not Currently  Other Topics Concern   Not on file  Social History Narrative   Patient lives at home alone and he is single.   Patient is semi retired.    Education college.   Right handed.   Caffeine two cokes daily.   Social Determinants of Health   Financial Resource Strain: Low Risk    Difficulty of Paying Living Expenses: Not hard at all  Food Insecurity: No Food Insecurity   Worried About Charity fundraiser in the Last Year: Never true   [place] in the Last Year: Never true  Transportation Needs: No Transportation Needs   Lack of Transportation (Medical): No   Lack of Transportation (Non-Medical): No  Physical Activity: Insufficiently Active   Days of Exercise per Week: 7 days   Minutes of Exercise per Session: 10 min  Stress: No Stress  Concern Present   Feeling of Stress : Not at all  Social Connections: Moderately Isolated   Frequency of Communication with Friends and Family: More than three times a week   Frequency of Social Gatherings with Friends and Family: More than three times a week   Attends Religious Services: More than 4 times per year   Active Member of [employer] or Organizations: No   Attends Music therapist: Never   Marital Status: Never married    Tobacco Counseling Counseling given: Not Answered   Clinical Intake:  Pre-visit preparation completed: Yes  Pain : 0-10 Pain Score: 4  Pain Type: Acute pain Pain Location: Leg Pain Orientation: Right  Pain Descriptors / Indicators: Discomfort Pain Onset: Other (comment) Pain Frequency: Constant     Nutritional Risks: None Diabetes: No  How often do you need to have someone help you when you read instructions, pamphlets, or other written materials from your doctor or pharmacy?: 1 - Never What is the last grade level you completed in school?: 12  Diabetic? NO  Interpreter Needed?: No      Activities of Daily Living In your present state of health, do you have any difficulty performing the following activities: 08/27/2021  Hearing? N  Vision? N  Difficulty concentrating or making decisions? N  Walking or climbing stairs? N  Dressing or bathing? N  Doing errands, shopping? N  Preparing Food and eating ? N  Using the Toilet? N  In the past six months, have you accidently leaked urine? N  Do you have problems with loss of bowel control? N  Managing your Medications? N  Managing your Finances? N  Housekeeping or managing your Housekeeping? N  Some recent data might be hidden    Patient Care Team: Charlott Rakes, MD as PCP - General (Family Medicine)  Indicate any recent Medical Services you may have received from other than [facility] providers in the past year (date may be approximate).     Assessment:   This is a routine  wellness examination for [place].  Hearing/Vision screen No results found.  Dietary issues and exercise activities discussed: Current Exercise Habits: The patient does not participate in regular exercise at present, Type of exercise: walking, Time (Minutes): 10, Frequency (Times/Week): 7, Weekly Exercise (Minutes/Week): 70, Intensity: Mild, Exercise limited by: None identified   Goals Addressed   None    Depression Screen PHQ 2/9 Scores 08/27/2021 07/11/2021 12/02/2019 10/17/2018 09/17/2018 06/20/2018 05/22/2018  PHQ - 2 Score 0 2 0 0 1 0 0  PHQ- 9 Score 0 4 0 '5 5 2 2  '$ Exception Documentation - - - - - - -    Fall Risk Fall Risk  08/27/2021 05/10/2021 12/02/2019 03/27/2019 10/17/2018  Falls in the past year? 0 0 1 0 No  Number falls in past yr: 0 0 0 - -  Injury with Fall? 0 0 0 - -  Risk for fall due to : No Fall Risks - - - -  Follow up Falls evaluation completed - - - -    FALL RISK PREVENTION PERTAINING TO THE HOME:  Any stairs in or around the home? No  If so, are there any without handrails? No  Home free of loose throw rugs in walkways, pet beds, electrical cords, etc? No  Adequate lighting in your home to reduce risk of falls? Yes   ASSISTIVE DEVICES UTILIZED TO PREVENT FALLS:  Life alert? No  Use of a cane, walker or w/c? No  Grab bars in the bathroom? No  Shower chair or bench in shower? No  Elevated toilet seat or a handicapped toilet? No   TIMED UP AND GO:  Was the test performed? No .  Length of time to ambulate 10 feet: n/a sec.     Cognitive Function:     6CIT Screen 08/27/2021  What Year? 0 points  What time? 0 points  Count back from 20 0 points  Repeat phrase 0 points    Immunizations Immunization History  Administered Date(s) Administered   Influenza,inj,Quad PF,6+ Mos 12/09/2015    TDAP status: Due, Education has been provided regarding the importance of this vaccine. Advised may receive this vaccine at  local pharmacy or Health Dept. Aware to  provide a copy of the vaccination record if obtained from local pharmacy or Health Dept. Verbalized acceptance and understanding.  Flu Vaccine status: Due, Education has been provided regarding the importance of this vaccine. Advised may receive this vaccine at local pharmacy or Health Dept. Aware to provide a copy of the vaccination record if obtained from local pharmacy or Health Dept. Verbalized acceptance and understanding.  Pneumococcal vaccine status: Due, Education has been provided regarding the importance of this vaccine. Advised may receive this vaccine at local pharmacy or Health Dept. Aware to provide a copy of the vaccination record if obtained from local pharmacy or Health Dept. Verbalized acceptance and understanding.  Covid-19 vaccine status: Information provided on how to obtain vaccines.   Qualifies for Shingles Vaccine? Yes   Zostavax completed No   Shingrix Completed?: No.    Education has been provided regarding the importance of this vaccine. Patient has been advised to call insurance company to determine out of pocket expense if they have not yet received this vaccine. Advised may also receive vaccine at local pharmacy or Health Dept. Verbalized acceptance and understanding.  Screening Tests Health Maintenance  Topic Date Due   COVID-19 Vaccine (1) Never done   TETANUS/TDAP  Never done   COLONOSCOPY (Pts 45-75yr Insurance coverage will need to be confirmed)  Never done   Zoster Vaccines- Shingrix (1 of 2) Never done   PNA vac Low Risk Adult (1 of 2 - PCV13) Never done   INFLUENZA VACCINE  07/25/2021   Hepatitis C Screening  Completed   HPV VACCINES  Aged Out    Health Maintenance  Health Maintenance Due  Topic Date Due   COVID-19 Vaccine (1) Never done   TETANUS/TDAP  Never done   COLONOSCOPY (Pts 45-480yrInsurance coverage will need to be confirmed)  Never done   Zoster Vaccines- Shingrix (1 of 2) Never done   PNA vac Low Risk Adult (1 of 2 - PCV13) Never  done   INFLUENZA VACCINE  07/25/2021    Colorectal cancer screening: Type of screening: Colonoscopy. Completed no. Repeat every 10 years  Lung Cancer Screening: (Low Dose CT Chest recommended if Age 72-80ears, 30 pack-year currently smoking OR have quit w/in 15years.) does qualify.   Lung Cancer Screening Referral: no  Additional Screening:  Hepatitis C Screening: does qualify; Completed 05/23/2018  Vision Screening: Recommended annual ophthalmology exams for early detection of glaucoma and other disorders of the eye. Is the patient up to date with their annual eye exam?  Yes  Who is the provider or what is the name of the office in which the patient attends annual eye exams? [facility]  If pt is not established with a provider, would they like to be referred to a provider to establish care? No .   Dental Screening: Recommended annual dental exams for proper oral hygiene  Community Resource Referral / Chronic Care Management: CRR required this visit?  No   CCM required this visit?  No      Plan:     I have personally reviewed and noted the following in the patient's chart:   Medical and social history Use of alcohol, tobacco or illicit drugs  Current medications and supplements including opioid prescriptions. Patient is not currently taking opioid prescriptions. Functional ability and status Nutritional status Physical activity Advanced directives List of other physicians Hospitalizations, surgeries, and ER visits in previous 12 months Vitals Screenings to include cognitive,  depression, and falls Referrals and appointments  In addition, I have reviewed and discussed with patient certain preventive protocols, quality metrics, and best practice recommendations. A written personalized care plan for preventive services as well as general preventive health recommendations were provided to patient.     Quintella Baton, [place]   08/27/2021   Nurse Notes: non  face to face 60 mins  Mr. Bosque , Thank you for taking time to come for your Medicare Wellness Visit. I appreciate your ongoing commitment to your health goals. Please review the following plan we discussed and let me know if I can assist you in the future.   These are the goals we discussed:  Goals   None     This is a list of the screening recommended for you and due dates:  Health Maintenance  Topic Date Due   COVID-19 Vaccine (1) Never done   Tetanus Vaccine  Never done   Colon Cancer Screening  Never done   Zoster (Shingles) Vaccine (1 of 2) Never done   Pneumonia vaccines (1 of 2 - PCV13) Never done   Flu Shot  07/25/2021   Hepatitis C Screening: USPSTF Recommendation to screen - Ages 100-79 yo.  Completed   HPV Vaccine  Aged Out

## 2021-09-05 ENCOUNTER — Institutional Professional Consult (permissible substitution): Payer: Medicare HMO | Admitting: Neurology

## 2021-09-13 ENCOUNTER — Other Ambulatory Visit: Payer: Self-pay | Admitting: Family Medicine

## 2021-09-13 DIAGNOSIS — I5032 Chronic diastolic (congestive) heart failure: Secondary | ICD-10-CM

## 2021-09-14 ENCOUNTER — Ambulatory Visit: Payer: Medicare HMO | Admitting: Podiatry

## 2021-09-15 DIAGNOSIS — L810 Postinflammatory hyperpigmentation: Secondary | ICD-10-CM | POA: Diagnosis not present

## 2021-09-15 DIAGNOSIS — L309 Dermatitis, unspecified: Secondary | ICD-10-CM | POA: Diagnosis not present

## 2021-09-16 ENCOUNTER — Other Ambulatory Visit: Payer: Self-pay | Admitting: Family Medicine

## 2021-09-16 DIAGNOSIS — I5032 Chronic diastolic (congestive) heart failure: Secondary | ICD-10-CM

## 2021-09-16 DIAGNOSIS — I10 Essential (primary) hypertension: Secondary | ICD-10-CM

## 2021-10-05 ENCOUNTER — Telehealth: Payer: Self-pay | Admitting: *Deleted

## 2021-10-05 NOTE — Telephone Encounter (Signed)
Copied from [place] [id number]. Topic: General - Other >> Oct 03, 2021  3:19 PM Celene Kras wrote: Reason for CRM: Pt called stating that the pt is needing to have colonoscopy rescheduled. Requesting to have a call back to discuss further. Please advise.

## 2021-10-06 NOTE — Telephone Encounter (Signed)
Pt states that he wants to get his lab drawn for his heart doctor(Dr.Harwani). He will bring the orders to the office tomorrow.  Will I be able to place these orders in epic for him to get drawn here.

## 2021-10-07 ENCOUNTER — Ambulatory Visit: Payer: Medicare HMO | Attending: Family Medicine

## 2021-10-07 ENCOUNTER — Other Ambulatory Visit: Payer: Self-pay

## 2021-10-07 DIAGNOSIS — I10 Essential (primary) hypertension: Secondary | ICD-10-CM

## 2021-10-07 NOTE — Telephone Encounter (Signed)
Orders has been placed and patient has had all labs drawn.

## 2021-10-07 NOTE — Telephone Encounter (Signed)
Yes you can place orders once he brings them in.

## 2021-10-08 ENCOUNTER — Other Ambulatory Visit: Payer: Self-pay | Admitting: Family Medicine

## 2021-10-08 DIAGNOSIS — I5032 Chronic diastolic (congestive) heart failure: Secondary | ICD-10-CM

## 2021-10-08 LAB — BASIC METABOLIC PANEL
BUN/Creatinine Ratio: 9 — ABNORMAL LOW (ref 10–24)
BUN: 11 mg/dL (ref 8–27)
CO2: 27 mmol/L (ref 20–29)
Calcium: 9.2 mg/dL (ref 8.6–10.2)
Chloride: 101 mmol/L (ref 96–106)
Creatinine, Ser: 1.27 mg/dL (ref 0.76–1.27)
Glucose: 82 mg/dL (ref 70–99)
Potassium: 4.2 mmol/L (ref 3.5–5.2)
Sodium: 139 mmol/L (ref 134–144)
eGFR: 60 mL/min/{1.73_m2} (ref >59)

## 2021-10-08 LAB — LIPID PANEL
Chol/HDL Ratio: 3.2 ratio (ref 0.0–5.0)
Cholesterol, Total: 146 mg/dL (ref 100–199)
HDL: 45 mg/dL (ref >39)
LDL Chol Calc (NIH): 85 mg/dL (ref 0–99)
Triglycerides: 85 mg/dL (ref 0–149)
VLDL Cholesterol Cal: 16 mg/dL (ref 5–40)

## 2021-10-08 LAB — HEPATIC FUNCTION PANEL
ALT: 13 IU/L (ref 0–44)
AST: 23 IU/L (ref 0–40)
Albumin: 4.1 g/dL (ref 3.7–4.7)
Alkaline Phosphatase: 103 IU/L (ref 44–121)
Bilirubin Total: 0.3 mg/dL (ref 0.0–1.2)
Bilirubin, Direct: <0.1 mg/dL (ref 0.00–0.40)
Total Protein: 7.6 g/dL (ref 6.0–8.5)

## 2021-10-08 LAB — TSH: TSH: 1.89 u[IU]/mL (ref 0.450–4.500)

## 2021-10-08 LAB — HEMOGLOBIN A1C
Est. average glucose Bld gHb Est-mCnc: 120 mg/dL
Hgb A1c MFr Bld: 5.8 % — ABNORMAL HIGH (ref 4.8–5.6)

## 2021-10-08 NOTE — Telephone Encounter (Signed)
Requested Prescriptions  Pending Prescriptions Disp Refills  . atorvastatin (LIPITOR) 40 MG tablet [Pharmacy Med Name: ATORVASTATIN 40 MG TABLET] 90 tablet 0    Sig: TAKE 1 TABLET BY MOUTH DAILY AT 6 PM.     Cardiovascular:  Antilipid - Statins Passed - 10/08/2021 11:06 AM      Passed - Total Cholesterol in normal range and within 360 days    Cholesterol, Total  Date Value Ref Range Status  10/07/2021 146 100 - 199 mg/dL Final         Passed - LDL in normal range and within 360 days    LDL Chol Calc (NIH)  Date Value Ref Range Status  10/07/2021 85 0 - 99 mg/dL Final         Passed - HDL in normal range and within 360 days    HDL  Date Value Ref Range Status  10/07/2021 45 >39 mg/dL Final         Passed - Triglycerides in normal range and within 360 days    Triglycerides  Date Value Ref Range Status  10/07/2021 85 0 - 149 mg/dL Final         Passed - Patient is not pregnant      Passed - Valid encounter within last 12 months    Recent Outpatient Visits          2 months ago Tinnitus of right ear   [place], Charlane Ferretti, MD   5 months ago Obstructive sleep apnea syndrome   [place] Elsie Stain, MD   1 year ago Lower urinary tract symptoms   [place], Enobong, MD   1 year ago Sick sinus syndrome [facility])   [place], Charlane Ferretti, MD   2 years ago Chronic diastolic congestive heart failure [facility])   [place], Enobong, MD      Future Appointments            In 3 months Charlott Rakes, MD [place]

## 2021-10-10 ENCOUNTER — Other Ambulatory Visit: Payer: Self-pay

## 2021-10-10 ENCOUNTER — Ambulatory Visit (INDEPENDENT_AMBULATORY_CARE_PROVIDER_SITE_OTHER): Payer: Medicare HMO | Admitting: Podiatry

## 2021-10-10 DIAGNOSIS — M79675 Pain in left toe(s): Secondary | ICD-10-CM | POA: Diagnosis not present

## 2021-10-10 DIAGNOSIS — L989 Disorder of the skin and subcutaneous tissue, unspecified: Secondary | ICD-10-CM

## 2021-10-10 DIAGNOSIS — M79674 Pain in right toe(s): Secondary | ICD-10-CM | POA: Diagnosis not present

## 2021-10-10 DIAGNOSIS — B351 Tinea unguium: Secondary | ICD-10-CM | POA: Diagnosis not present

## 2021-10-10 NOTE — Progress Notes (Signed)
   SUBJECTIVE Patient presents to office today complaining of elongated, thickened nails that cause pain while ambulating in shoes.  He  is unable to trim his own nails. Patient is here for further evaluation and treatment.  Past Medical History:  Diagnosis Date   Anemia    Asthma    Atrial fibrillation (HCC)    CHF (congestive heart failure) (HCC)    COPD (chronic obstructive pulmonary disease) ([place])    Heart disease    Hypertension    Syncope     OBJECTIVE General Patient is awake, alert, and oriented x 3 and in no acute distress. Derm Skin is dry and supple bilateral. Negative open lesions or macerations. Remaining integument unremarkable. Nails are tender, long, thickened and dystrophic with subungual debris, consistent with onychomycosis, 1-5 bilateral. No signs of infection noted.  Hyperkeratotic preulcerative callus tissue also noted to the distal tips of the toes bilateral Vasc  DP and PT pedal pulses palpable bilaterally. Temperature gradient within normal limits.  Neuro Epicritic and protective threshold sensation grossly intact bilaterally.  Musculoskeletal Exam No symptomatic pedal deformities noted bilateral. Muscular strength within normal limits.  ASSESSMENT 1. Onychodystrophic nails 1-5 bilateral with hyperkeratosis of nails.  2. Onychomycosis of nail due to dermatophyte bilateral 3. Pain in foot bilateral 4.  Preulcerative calluses bilateral feet  PLAN OF CARE 1. Patient evaluated today.  2. Instructed to maintain good pedal hygiene and foot care.  3. Mechanical debridement of nails 1-5 bilaterally performed using a nail nipper. Filed with dremel without incident.  4.  Excisional debridement of the hyperkeratotic callus tissue was performed using a tissue nipper without incident or bleeding  5.  Return to clinic in 3 mos. for routine foot care   Edrick Kins, DPM [facility]  Dr. Edrick Kins, DPM    [address], [place] [address]                Office [phone]  Fax [phone]

## 2021-10-25 ENCOUNTER — Telehealth: Payer: Self-pay

## 2021-10-25 NOTE — Telephone Encounter (Signed)
Patient name and DOB has been verified Patient was informed of lab results. Patient had no questions.   Lab has been faxed to Dr. Zenia Resides office.

## 2021-10-25 NOTE — Telephone Encounter (Signed)
-----   Message from Charlott Rakes, MD sent at 10/10/2021  9:51 AM EDT ----- Labs are normal. He is Prediabetic with A1c of 5.8. Continue to work on low carb, low sugars. Please fax to Dr Terrence Dupont his Cardiologist. Thanks

## 2021-11-08 DIAGNOSIS — L810 Postinflammatory hyperpigmentation: Secondary | ICD-10-CM | POA: Diagnosis not present

## 2021-11-08 DIAGNOSIS — L408 Other psoriasis: Secondary | ICD-10-CM | POA: Diagnosis not present

## 2021-11-08 DIAGNOSIS — S70921A Unspecified superficial injury of right thigh, initial encounter: Secondary | ICD-10-CM | POA: Diagnosis not present

## 2021-11-14 ENCOUNTER — Institutional Professional Consult (permissible substitution): Payer: Medicare HMO | Admitting: Neurology

## 2021-11-23 DIAGNOSIS — I2510 Atherosclerotic heart disease of native coronary artery without angina pectoris: Secondary | ICD-10-CM | POA: Diagnosis not present

## 2021-11-23 DIAGNOSIS — R7303 Prediabetes: Secondary | ICD-10-CM | POA: Diagnosis not present

## 2021-11-23 DIAGNOSIS — E785 Hyperlipidemia, unspecified: Secondary | ICD-10-CM | POA: Diagnosis not present

## 2021-11-23 DIAGNOSIS — I10 Essential (primary) hypertension: Secondary | ICD-10-CM | POA: Diagnosis not present

## 2021-12-01 ENCOUNTER — Other Ambulatory Visit: Payer: Self-pay

## 2021-12-01 ENCOUNTER — Ambulatory Visit (INDEPENDENT_AMBULATORY_CARE_PROVIDER_SITE_OTHER): Payer: Medicare HMO | Admitting: Endocrinology

## 2021-12-01 VITALS — BP 120/46 | HR 64 | Ht 67.0 in | Wt 238.4 lb

## 2021-12-01 DIAGNOSIS — N62 Hypertrophy of breast: Secondary | ICD-10-CM | POA: Diagnosis not present

## 2021-12-01 MED ORDER — SILDENAFIL CITRATE 100 MG PO TABS: 100.0000 mg | ORAL_TABLET | Freq: Every day | ORAL | 11 refills | Status: DC | PRN

## 2021-12-01 NOTE — Patient Instructions (Addendum)
I have sent a prescription to your pharmacy, to refill the Viagra. Please let me know if you want to have surgery for the breast swelling. Blood tests are requested for you today.  We'll let you know about the results.   Please come back for a follow-up appointment in 1 year.

## 2021-12-01 NOTE — Progress Notes (Signed)
Subjective:    Patient ID: [name] [name], male    DOB: 1950/12/28, 70 y.o.   MRN: [id number]  HPI Pt returns for f/u of idiopathic central hypogonadism (he has 1 biological child; he has never taken illicit androgens; he denies any h/o infertility; he was seen at [facility], where he was dx'ed with hypogonadotropic hypogonadism; however, pt says he wishes to f/u here instead; he has been on clomid since 2015; total T is low, but free is normal).  He takes clomid as rx'ed.    ED sxs persist: He has not recently taken Viagra.   Gynecomastia: tamoxifen was rx'ed in early 2017; he declines surgery; (mammography 2022 showed GM).  Since then, he says breast swelling is unchanged.   Past Medical History:  Diagnosis Date   Anemia    Asthma    Atrial fibrillation (HCC)    CHF (congestive heart failure) (HCC)    COPD (chronic obstructive pulmonary disease) ([place])    Heart disease    Hypertension    Syncope     Past Surgical History:  Procedure Laterality Date   CARDIAC CATHETERIZATION N/A 12/08/2015   Procedure: Left Heart Cath and Coronary Angiography;  Surgeon: Charolette Forward, MD;  Location: [facility];  Service: Cardiovascular;  Laterality: N/A;   ESOPHAGUS SURGERY     SHOULDER CLOSED REDUCTION Left 12/01/2017   Procedure: CLOSED REDUCTION SHOULDER;  Surgeon: Altamese Industry, MD;  Location: [facility];  Service: Orthopedics;  Laterality: Left;    Social History   Socioeconomic History   Marital status: Single    Spouse name: Not on file   Number of children: 1   Years of education: college   Highest education level: Not on file  Occupational History   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    Employer: [employer]  Tobacco Use   Smoking status: Never   Smokeless tobacco: Never  Vaping Use   Vaping Use: Never used  Substance and Sexual Activity   Alcohol use: No    Alcohol/week: 0.0 standard drinks   Drug use: No   Sexual activity: Not Currently  Other Topics Concern   Not on file   Social History Narrative   Patient lives at home alone and he is single.   Patient is semi retired.    Education college.   Right handed.   Caffeine two cokes daily.   Social Determinants of Health   Financial Resource Strain: Low Risk    Difficulty of Paying Living Expenses: Not hard at all  Food Insecurity: No Food Insecurity   Worried About Charity fundraiser in the Last Year: Never true   [place] in the Last Year: Never true  Transportation Needs: No Transportation Needs   Lack of Transportation (Medical): No   Lack of Transportation (Non-Medical): No  Physical Activity: Insufficiently Active   Days of Exercise per Week: 7 days   Minutes of Exercise per Session: 10 min  Stress: No Stress Concern Present   Feeling of Stress : Not at all  Social Connections: Moderately Isolated   Frequency of Communication with Friends and Family: More than three times a week   Frequency of Social Gatherings with Friends and Family: More than three times a week   Attends Religious Services: More than 4 times per year   Active Member of [employer] or Organizations: No   Attends Archivist Meetings: Never   Marital Status: Never married  Intimate Partner Violence: Not At Risk  Fear of Current or Ex-Partner: No   Emotionally Abused: No   Physically Abused: No   Sexually Abused: No    Current Outpatient Medications on File Prior to Visit  Medication Sig Dispense Refill   acetaminophen (TYLENOL) 325 MG tablet Take 2 tablets (650 mg total) by mouth every 6 (six) hours as needed for mild pain (or Fever >/= 101). 30 tablet 0   albuterol (PROVENTIL HFA;VENTOLIN HFA) 108 (90 Base) MCG/ACT inhaler Inhale 2 puffs into the lungs every 6 (six) hours as needed. 1 Inhaler 5   albuterol (PROVENTIL) (2.5 MG/3ML) 0.083% nebulizer solution Take 3 mLs (2.5 mg total) by nebulization every 6 (six) hours as needed for wheezing or shortness of breath. 150 mL 1   allopurinol (ZYLOPRIM) 300 MG tablet  Take 1 tablet (300 mg total) by mouth daily. 90 tablet 1   amiodarone (PACERONE) 200 MG tablet Take 1 tablet (200 mg total) by mouth 2 (two) times daily. 60 tablet 2   ARNUITY ELLIPTA 100 MCG/ACT AEPB TAKE 1 PUFF BY MOUTH EVERY DAY 30 each 0   ARNUITY ELLIPTA 100 MCG/ACT AEPB Inhale 1 puff into the lungs daily. 30 each 3   aspirin EC 81 MG tablet Take 81 mg by mouth daily.     atorvastatin (LIPITOR) 40 MG tablet TAKE 1 TABLET BY MOUTH DAILY AT 6 PM. 90 tablet 0   clomiPHENE (CLOMID) 50 MG tablet 1/4 tab daily     clotrimazole-betamethasone (LOTRISONE) cream Apply 1 application topically 2 (two) times daily as needed (FOR RASH).     diclofenac Sodium (VOLTAREN) 1 % GEL Apply 4 g topically 4 (four) times daily. 100 g 1   digoxin (LANOXIN) 0.125 MG tablet Take 1 tablet (0.125 mg total) by mouth daily. 30 tablet 3   ferrous sulfate 325 (65 FE) MG tablet Take 325 mg by mouth daily with breakfast. Reported on 03/16/2016     furosemide (LASIX) 20 MG tablet TAKE 1 TABLET BY MOUTH EVERY DAY 90 tablet 1   losartan (COZAAR) 100 MG tablet Take 100 mg by mouth daily.  3   methocarbamol (ROBAXIN) 500 MG tablet TAKE 1 TABLET (500 MG TOTAL) BY MOUTH EVERY 8 (EIGHT) HOURS AS NEEDED FOR MUSCLE SPASM/PAIN 90 tablet 0   metoprolol succinate (TOPROL-XL) 25 MG 24 hr tablet TAKE 1 TABLET BY MOUTH EVERY DAY 90 tablet 1   nitroGLYCERIN (NITROSTAT) 0.4 MG SL tablet Place 1 tablet (0.4 mg total) under the tongue every 5 (five) minutes as needed for chest pain. 25 tablet 1   pantoprazole (PROTONIX) 40 MG tablet TAKE 1 TABLET BY MOUTH EVERY DAY 90 tablet 0   potassium chloride (K-DUR) 10 MEQ tablet Take 10 mEq by mouth daily.     tacrolimus (PROTOPIC) 0.1 % ointment      tamoxifen (NOLVADEX) 10 MG tablet Take 1 tablet (10 mg total) by mouth 2 (two) times daily. 180 tablet 3   tamsulosin (FLOMAX) 0.4 MG CAPS capsule Take 1 capsule (0.4 mg total) by mouth daily. 90 capsule 1   No current facility-administered medications on  file prior to visit.    No Known Allergies  Family History  Problem Relation Age of Onset   Cancer Mother     BP (!) 120/46   Pulse 64   Ht 5\' 7"  (1.702 m)   Wt 238 lb 6.4 oz (108.1 kg)   SpO2 98%   BMI 37.34 kg/m    Review of Systems     Objective:  Physical Exam VITAL SIGNS:  See vs page GENERAL: no distress BREASTS: bilat pseudoGM.   EXT: 1+ bilat leg edema  Lab Results  Component Value Date   TSH 1.890 10/07/2021      Assessment & Plan:  ED: uncontrolled GM: uncontrolled Hypogonadism: due for recheck.  Patient Instructions  I have sent a prescription to your pharmacy, to refill the Viagra. Please let me know if you want to have surgery for the breast swelling. Blood tests are requested for you today.  We'll let you know about the results.   Please come back for a follow-up appointment in 1 year.

## 2021-12-05 ENCOUNTER — Institutional Professional Consult (permissible substitution): Payer: Medicare HMO | Admitting: Neurology

## 2021-12-05 ENCOUNTER — Encounter: Payer: Self-pay | Admitting: Neurology

## 2021-12-05 LAB — TESTOSTERONE,FREE AND TOTAL
Testosterone, Free: 8.1 pg/mL (ref 6.6–18.1)
Testosterone: 187 ng/dL — ABNORMAL LOW (ref 264–916)

## 2021-12-08 LAB — ESTRADIOL, FREE
Estradiol, Free: 0.81 pg/mL — ABNORMAL HIGH
Estradiol: 38 pg/mL — ABNORMAL HIGH (ref <=29)

## 2021-12-15 ENCOUNTER — Ambulatory Visit: Payer: Self-pay | Admitting: *Deleted

## 2021-12-15 NOTE — Telephone Encounter (Signed)
Summary: advice- ringing in ears   Pt stated he has ringing in his ears lately, was not sure if this had something to do with his equilibrium, pt needed some advice - has appt on 01/11/21, no openings any sooner.       Chief Complaint: ringing in ears, balance  Symptoms: fainted this am unknown how long .  Frequency:  approx. More than 30 min. Pertinent Negatives: Patient denies chest pain, difficulty breathing, no confusion no c/o feeling faint now . Disposition: [x] ED /[] Urgent Care (no appt availability in office) / [] Appointment(In office/virtual)/ []  [facility]/ [] Home Care/ [x] Refused Recommended Disposition  Additional Notes: reported having BM on toilet and woke up in tub/shower . Alert and oriented x 3 now. Reports if he feels worse he will go to ED and verbalized he will not drive. Please advise .   Reason for Disposition  [1] Age > 50 years  AND [2] now alert and feels fine  Answer Assessment - Initial Assessment Questions 1. ONSET: "How long were you unconscious?" (minutes) "When did it happen?"     Unable to give time approx 30 minutes  2. CONTENT: "What happened during period of unconsciousness?" (e.g., seizure activity)      Not sure  3. MENTAL STATUS: "Alert and oriented now?" (oriented x 3 = name, month, location)      Yes  4. TRIGGER: "What do you think caused the fainting?" "What were you doing just before you fainted?"  (e.g., exercise, sudden standing up, prolonged standing)     On commode having BM  5. RECURRENT SYMPTOM: "Have you ever passed out before?" If Yes, ask: "When was the last time?" and "What happened that time?"      Yes 2018 and this n 6. INJURY: "Did you sustain any injury during the fall?"      No  7. CARDIAC SYMPTOMS: "Have you had any of the following symptoms: chest pain, difficulty breathing, palpitations?"     na 8. NEUROLOGIC SYMPTOMS: "Have you had any of the following symptoms: headache, numbness, vertigo, weakness?"     No   9. GI SYMPTOMS: "Have you had any of the following symptoms: abdominal pain, vomiting, diarrhea, blood in stools?"     Na 10. OTHER SYMPTOMS: "Do you have any other symptoms?"       Ringing in ears. "Blacked out" from toilet and woke up in tub/ shower 11. PREGNANCY: "Is there any chance you are pregnant?" "When was your last menstrual period?"       na  Protocols used: Fainting-A-AH

## 2021-12-15 NOTE — Telephone Encounter (Signed)
Called pt stated he is in the grocery store and he will call back if needed

## 2022-01-11 ENCOUNTER — Ambulatory Visit: Payer: Medicare HMO | Attending: Family Medicine | Admitting: Family Medicine

## 2022-01-11 ENCOUNTER — Encounter: Payer: Self-pay | Admitting: Family Medicine

## 2022-01-11 ENCOUNTER — Other Ambulatory Visit: Payer: Self-pay

## 2022-01-11 VITALS — BP 128/83 | HR 67 | Ht 67.0 in | Wt 238.4 lb

## 2022-01-11 DIAGNOSIS — M1A0710 Idiopathic chronic gout, right ankle and foot, without tophus (tophi): Secondary | ICD-10-CM

## 2022-01-11 DIAGNOSIS — R399 Unspecified symptoms and signs involving the genitourinary system: Secondary | ICD-10-CM | POA: Diagnosis not present

## 2022-01-11 DIAGNOSIS — I10 Essential (primary) hypertension: Secondary | ICD-10-CM

## 2022-01-11 DIAGNOSIS — I5032 Chronic diastolic (congestive) heart failure: Secondary | ICD-10-CM | POA: Diagnosis not present

## 2022-01-11 DIAGNOSIS — Z7182 Exercise counseling: Secondary | ICD-10-CM | POA: Insufficient documentation

## 2022-01-11 DIAGNOSIS — E291 Testicular hypofunction: Secondary | ICD-10-CM | POA: Insufficient documentation

## 2022-01-11 DIAGNOSIS — I110 Hypertensive heart disease with heart failure: Secondary | ICD-10-CM | POA: Insufficient documentation

## 2022-01-11 DIAGNOSIS — I480 Paroxysmal atrial fibrillation: Secondary | ICD-10-CM | POA: Diagnosis not present

## 2022-01-11 DIAGNOSIS — I495 Sick sinus syndrome: Secondary | ICD-10-CM | POA: Diagnosis not present

## 2022-01-11 DIAGNOSIS — Z713 Dietary counseling and surveillance: Secondary | ICD-10-CM | POA: Insufficient documentation

## 2022-01-11 DIAGNOSIS — M109 Gout, unspecified: Secondary | ICD-10-CM | POA: Insufficient documentation

## 2022-01-11 DIAGNOSIS — Z23 Encounter for immunization: Secondary | ICD-10-CM | POA: Insufficient documentation

## 2022-01-11 DIAGNOSIS — J449 Chronic obstructive pulmonary disease, unspecified: Secondary | ICD-10-CM | POA: Insufficient documentation

## 2022-01-11 DIAGNOSIS — N400 Enlarged prostate without lower urinary tract symptoms: Secondary | ICD-10-CM | POA: Insufficient documentation

## 2022-01-11 DIAGNOSIS — R55 Syncope and collapse: Secondary | ICD-10-CM | POA: Diagnosis not present

## 2022-01-11 DIAGNOSIS — Z79899 Other long term (current) drug therapy: Secondary | ICD-10-CM | POA: Insufficient documentation

## 2022-01-11 DIAGNOSIS — K219 Gastro-esophageal reflux disease without esophagitis: Secondary | ICD-10-CM | POA: Insufficient documentation

## 2022-01-11 DIAGNOSIS — I2510 Atherosclerotic heart disease of native coronary artery without angina pectoris: Secondary | ICD-10-CM | POA: Diagnosis not present

## 2022-01-11 DIAGNOSIS — R569 Unspecified convulsions: Secondary | ICD-10-CM | POA: Insufficient documentation

## 2022-01-11 MED ORDER — TAMSULOSIN HCL 0.4 MG PO CAPS: 0.4000 mg | ORAL_CAPSULE | Freq: Every day | ORAL | 1 refills | Status: DC

## 2022-01-11 MED ORDER — PANTOPRAZOLE SODIUM 40 MG PO TBEC: 40.0000 mg | DELAYED_RELEASE_TABLET | Freq: Every day | ORAL | 1 refills | Status: DC

## 2022-01-11 MED ORDER — ALLOPURINOL 300 MG PO TABS: 300.0000 mg | ORAL_TABLET | Freq: Every day | ORAL | 1 refills | Status: DC

## 2022-01-11 MED ORDER — METOPROLOL SUCCINATE ER 25 MG PO TB24: 25.0000 mg | ORAL_TABLET | Freq: Every day | ORAL | 1 refills | Status: DC

## 2022-01-11 MED ORDER — ATORVASTATIN CALCIUM 40 MG PO TABS: 40.0000 mg | ORAL_TABLET | Freq: Every day | ORAL | 1 refills | Status: DC

## 2022-01-11 NOTE — Assessment & Plan Note (Signed)
Stable Continue PPI Advised to avoid recumbency up to 2 hours postmeal, avoid late meals, avoid foods that trigger symptoms.

## 2022-01-11 NOTE — Patient Instructions (Signed)
[facility] [phone]

## 2022-01-11 NOTE — Assessment & Plan Note (Signed)
Controlled Continue current management Counseled on blood pressure goal of less than 130/80, low-sodium, DASH diet, medication compliance, 150 minutes of moderate intensity exercise per week. Discussed medication compliance, adverse effects.

## 2022-01-11 NOTE — Assessment & Plan Note (Signed)
Stable with no recent flares

## 2022-01-11 NOTE — Assessment & Plan Note (Signed)
Euvolemic with EF of 55 to 60% from echo of 11/2017 Continue guideline directed medical therapy Followed by cardiology

## 2022-01-11 NOTE — Assessment & Plan Note (Addendum)
CT in 2018 at the time when he had recurrent syncopal episodes was negative He does have sick sinus syndrome which could also have this presentation and he has been advised to notify his cardiologist of ongoing symptoms EKG today reveals unspecific ST changes Negative carotid Doppler from 03/2017

## 2022-01-11 NOTE — Progress Notes (Signed)
Subjective:  Patient ID: [name] [name], male    DOB: [birth year]  Age: 71 y.o. MRN: [id number]  CC: Hypertension   HPI [name] [name] is a 71 y.o. year old male with a history of paroxysmal A. fib, atrial flutter, diastolic congestive heart failure (EF 55-60% in 11/2017), CAD (status post cardiac cath in 11/2015 - mid LAD 20% stenosis, dist LAD 40% stenosis, Ost RCA to prox RCA 30% stenosis, normal LV systolic function), sick sinus syndrome, COPD, GERD, seizures (previously managed by [facility]), BPH, hypogonadism (managed by endocrine),Gout here for a follow up visit.  Interval History: Last seen by his cardiologist Dr. Terrence Dupont in 10/2021. He states he had an episode where he 'blacked out' in 11/2021 and he fell while in the bathroom. He had similar episodes in 2018 and at that time his CT head was normal, carotid Doppler was negative.  He has subsequently had very few episodes since then.  He did not mention this to his cardiologist. He does have history of seizures but is not on medications as he has had no recent seizures.  Last seen by [facility] Dr Krista Blue in 2018 and was previously on [place]. Denies chest pain, dyspnea.  In 11/2021 he had a visit with his endocrinologist Dr. Loanne Drilling for management of hypogonadism and labs reveal elevated estradiol and decreased testosterone. Count has been stable with no recent flares. He denies additional concerns. Past Medical History:  Diagnosis Date   Anemia    Asthma    Atrial fibrillation (HCC)    CHF (congestive heart failure) (HCC)    COPD (chronic obstructive pulmonary disease) ([place])    Heart disease    Hypertension    Syncope     Past Surgical History:  Procedure Laterality Date   CARDIAC CATHETERIZATION N/A 12/08/2015   Procedure: Left Heart Cath and Coronary Angiography;  Surgeon: Charolette Forward, MD;  Location: [facility];  Service: Cardiovascular;  Laterality: N/A;   ESOPHAGUS SURGERY     SHOULDER CLOSED REDUCTION Left 12/01/2017    Procedure: CLOSED REDUCTION SHOULDER;  Surgeon: Altamese Falling [place], MD;  Location: [facility];  Service: Orthopedics;  Laterality: Left;    Family History  Problem Relation Age of Onset   Cancer Mother     No Known Allergies  Outpatient Medications Prior to Visit  Medication Sig Dispense Refill   acetaminophen (TYLENOL) 325 MG tablet Take 2 tablets (650 mg total) by mouth every 6 (six) hours as needed for mild pain (or Fever >/= 101). 30 tablet 0   albuterol (PROVENTIL HFA;VENTOLIN HFA) 108 (90 Base) MCG/ACT inhaler Inhale 2 puffs into the lungs every 6 (six) hours as needed. 1 Inhaler 5   albuterol (PROVENTIL) (2.5 MG/3ML) 0.083% nebulizer solution Take 3 mLs (2.5 mg total) by nebulization every 6 (six) hours as needed for wheezing or shortness of breath. 150 mL 1   amiodarone (PACERONE) 200 MG tablet Take 1 tablet (200 mg total) by mouth 2 (two) times daily. 60 tablet 2   ARNUITY ELLIPTA 100 MCG/ACT AEPB TAKE 1 PUFF BY MOUTH EVERY DAY 30 each 0   ARNUITY ELLIPTA 100 MCG/ACT AEPB Inhale 1 puff into the lungs daily. 30 each 3   aspirin EC 81 MG tablet Take 81 mg by mouth daily.     clomiPHENE (CLOMID) 50 MG tablet 1/4 tab daily     clotrimazole-betamethasone (LOTRISONE) cream Apply 1 application topically 2 (two) times daily as needed (FOR RASH).     diclofenac Sodium (VOLTAREN) 1 %  GEL Apply 4 g topically 4 (four) times daily. 100 g 1   digoxin (LANOXIN) 0.125 MG tablet Take 1 tablet (0.125 mg total) by mouth daily. 30 tablet 3   ferrous sulfate 325 (65 FE) MG tablet Take 325 mg by mouth daily with breakfast. Reported on 03/16/2016     furosemide (LASIX) 20 MG tablet TAKE 1 TABLET BY MOUTH EVERY DAY 90 tablet 1   losartan (COZAAR) 100 MG tablet Take 100 mg by mouth daily.  3   methocarbamol (ROBAXIN) 500 MG tablet TAKE 1 TABLET (500 MG TOTAL) BY MOUTH EVERY 8 (EIGHT) HOURS AS NEEDED FOR MUSCLE SPASM/PAIN 90 tablet 0   nitroGLYCERIN (NITROSTAT) 0.4 MG SL tablet Place 1 tablet (0.4 mg total) under  the tongue every 5 (five) minutes as needed for chest pain. 25 tablet 1   potassium chloride (K-DUR) 10 MEQ tablet Take 10 mEq by mouth daily.     sildenafil (VIAGRA) 100 MG tablet Take 1 tablet (100 mg total) by mouth daily as needed for erectile dysfunction. 10 tablet 11   tacrolimus (PROTOPIC) 0.1 % ointment      tamoxifen (NOLVADEX) 10 MG tablet Take 1 tablet (10 mg total) by mouth 2 (two) times daily. 180 tablet 3   allopurinol (ZYLOPRIM) 300 MG tablet Take 1 tablet (300 mg total) by mouth daily. 90 tablet 1   atorvastatin (LIPITOR) 40 MG tablet TAKE 1 TABLET BY MOUTH DAILY AT 6 PM. 90 tablet 0   metoprolol succinate (TOPROL-XL) 25 MG 24 hr tablet TAKE 1 TABLET BY MOUTH EVERY DAY 90 tablet 1   pantoprazole (PROTONIX) 40 MG tablet TAKE 1 TABLET BY MOUTH EVERY DAY 90 tablet 0   tamsulosin (FLOMAX) 0.4 MG CAPS capsule Take 1 capsule (0.4 mg total) by mouth daily. 90 capsule 1   No facility-administered medications prior to visit.     ROS Review of Systems  Constitutional:  Negative for activity change and appetite change.  HENT:  Negative for sinus pressure and sore throat.   Eyes:  Negative for visual disturbance.  Respiratory:  Negative for cough, chest tightness and shortness of breath.   Cardiovascular:  Negative for chest pain and leg swelling.  Gastrointestinal:  Negative for abdominal distention, abdominal pain, constipation and diarrhea.  Endocrine: Negative.   Genitourinary:  Negative for dysuria.  Musculoskeletal:  Negative for joint swelling and myalgias.  Skin:  Negative for rash.  Allergic/Immunologic: Negative.   Neurological:  Negative for weakness, light-headedness and numbness.  Psychiatric/Behavioral:  Negative for dysphoric mood and suicidal ideas.    Objective:  BP 128/83    Pulse 67    Ht 5\' 7"  (1.702 m)    Wt 238 lb 6.4 oz (108.1 kg)    SpO2 96%    BMI 37.34 kg/m   BP/Weight 01/11/2022 12/01/2021 9/37/1696  Systolic BP 789 381 017  Diastolic BP 83 46 71   Wt. (Lbs) 238.4 238.4 244  BMI 37.34 37.34 38.22      Physical Exam Constitutional:      Appearance: He is well-developed.  Cardiovascular:     Rate and Rhythm: Normal rate.     Heart sounds: Normal heart sounds. No murmur heard. Pulmonary:     Effort: Pulmonary effort is normal.     Breath sounds: Normal breath sounds. No wheezing or rales.  Chest:     Chest wall: No tenderness.  Abdominal:     [name]: Bowel sounds are normal. There is no distension.     Palpations:  Abdomen is soft. There is no mass.     Tenderness: There is no abdominal tenderness.  Musculoskeletal:        [name]: Normal range of motion.     Right lower leg: No edema.     Left lower leg: No edema.  Neurological:     Mental Status: He is alert and oriented to person, place, and time.  Psychiatric:        Mood and Affect: Mood normal.    CMP Latest Ref Rng & Units 10/07/2021 07/18/2021 09/22/2020  Glucose 70 - 99 mg/dL 82 83 108(H)  BUN 8 - 27 mg/dL 11 12 29(H)  Creatinine 0.76 - 1.27 mg/dL 1.27 1.38(H) 1.40(H)  Sodium 134 - 144 mmol/L 139 143 137  Potassium 3.5 - 5.2 mmol/L 4.2 4.1 4.0  Chloride 96 - 106 mmol/L 101 103 103  CO2 20 - 29 mmol/L 27 26 21   Calcium 8.6 - 10.2 mg/dL 9.2 9.5 9.6  Total Protein 6.0 - 8.5 g/dL 7.6 7.7 7.7  Total Bilirubin 0.0 - 1.2 mg/dL 0.3 0.5 <0.2  Alkaline Phos 44 - 121 IU/L 103 99 99  AST 0 - 40 IU/L 23 20 25   ALT 0 - 44 IU/L 13 12 14     Lipid Panel     Component Value Date/Time   CHOL 146 10/07/2021 1017   TRIG 85 10/07/2021 1017   HDL 45 10/07/2021 1017   CHOLHDL 3.2 10/07/2021 1017   CHOLHDL 2.5 12/09/2015 0133   VLDL 11 12/09/2015 0133   LDLCALC 85 10/07/2021 1017    CBC    Component Value Date/Time   WBC 8.3 01/20/2019 0345   RBC 5.22 01/20/2019 0345   HGB 11.0 (L) 01/20/2019 0345   HGB 11.7 (L) 11/19/2017 1138   HCT 37.3 (L) 01/20/2019 0345   HCT 38.1 11/19/2017 1138   PLT 348 01/20/2019 0345   PLT 378 11/19/2017 1138   MCV 71.5 (L)  01/20/2019 0345   MCV 70 (L) 11/19/2017 1138   MCH 21.1 (L) 01/20/2019 0345   MCHC 29.5 (L) 01/20/2019 0345   RDW 15.9 (H) 01/20/2019 0345   RDW 17.2 (H) 11/19/2017 1138   LYMPHSABS 2.1 12/01/2017 2242   LYMPHSABS 3.1 11/19/2017 1138   MONOABS 1.2 (H) 12/01/2017 2242   EOSABS 0.3 12/01/2017 2242   EOSABS 0.5 (H) 11/19/2017 1138   BASOSABS 0.0 12/01/2017 2242   BASOSABS 0.1 11/19/2017 1138    Lab Results  Component Value Date   HGBA1C 5.8 (H) 10/07/2021    Lab Results  Component Value Date   TSH 1.890 10/07/2021    Assessment & Plan:   Problem List Items Addressed This Visit       Cardiovascular and Mediastinum   Essential hypertension, benign    Controlled Continue current management Counseled on blood pressure goal of less than 130/80, low-sodium, DASH diet, medication compliance, 150 minutes of moderate intensity exercise per week. Discussed medication compliance, adverse effects.       Relevant Medications   atorvastatin (LIPITOR) 40 MG tablet   metoprolol succinate (TOPROL-XL) 25 MG 24 hr tablet   Hypertensive heart disease    Euvolemic with EF of 55 to 60% from echo of 11/2017 Continue guideline directed medical therapy Followed by cardiology      Relevant Medications   atorvastatin (LIPITOR) 40 MG tablet   metoprolol succinate (TOPROL-XL) 25 MG 24 hr tablet     Digestive   GERD    Stable Continue PPI Advised to avoid recumbency  up to 2 hours postmeal, avoid late meals, avoid foods that trigger symptoms.       Relevant Medications   pantoprazole (PROTONIX) 40 MG tablet     Other   Seizures (HCC)    No recent seizures He was previously on Keppra but currently is not on any antiepileptic medications. In the light of ongoing intermittent syncope I have referred him again to neurology to exclude possible drop attacks.      Relevant Orders   Ambulatory referral to Neurology   Syncope - Primary    CT in 2018 at the time when he had recurrent  syncopal episodes was negative He does have sick sinus syndrome which could also have this presentation and he has been advised to notify his cardiologist of ongoing symptoms EKG today reveals unspecific ST changes Negative carotid Doppler from 03/2017      Relevant Orders   Ambulatory referral to Neurology   Gout    Stable with no recent flares      Relevant Medications   allopurinol (ZYLOPRIM) 300 MG tablet   Other Visit Diagnoses     Lower urinary tract symptoms       Relevant Medications   tamsulosin (FLOMAX) 0.4 MG CAPS capsule   Need for immunization against influenza       Relevant Orders   Flu Vaccine QUAD 68mo+IM (Fluarix, Fluzone & Alfiuria Quad PF) (Completed)   Need for pneumococcal vaccine       Relevant Orders   Pneumococcal conjugate vaccine 20-valent (Completed)        Health Care Maintenance: Flu shot and pneumonia vaccine administered today Meds ordered this encounter  Medications   allopurinol (ZYLOPRIM) 300 MG tablet    Sig: Take 1 tablet (300 mg total) by mouth daily.    Dispense:  90 tablet    Refill:  1   atorvastatin (LIPITOR) 40 MG tablet    Sig: Take 1 tablet (40 mg total) by mouth daily.    Dispense:  90 tablet    Refill:  1   metoprolol succinate (TOPROL-XL) 25 MG 24 hr tablet    Sig: Take 1 tablet (25 mg total) by mouth daily.    Dispense:  90 tablet    Refill:  1   pantoprazole (PROTONIX) 40 MG tablet    Sig: Take 1 tablet (40 mg total) by mouth daily.    Dispense:  90 tablet    Refill:  1   tamsulosin (FLOMAX) 0.4 MG CAPS capsule    Sig: Take 1 capsule (0.4 mg total) by mouth daily.    Dispense:  90 capsule    Refill:  1    Follow-up: Return in about 3 months (around 04/11/2022) for Chronic medical conditions.       Charlott Rakes, MD, FAAFP. [facility] [place], [place]   01/11/2022, 1:30 PM

## 2022-01-11 NOTE — Assessment & Plan Note (Signed)
No recent seizures He was previously on Keppra but currently is not on any antiepileptic medications. In the light of ongoing intermittent syncope I have referred him again to neurology to exclude possible drop attacks.

## 2022-01-12 ENCOUNTER — Ambulatory Visit: Payer: Medicare HMO | Admitting: Physician Assistant

## 2022-01-16 ENCOUNTER — Ambulatory Visit: Payer: Medicare HMO | Admitting: Podiatry

## 2022-01-25 ENCOUNTER — Ambulatory Visit (INDEPENDENT_AMBULATORY_CARE_PROVIDER_SITE_OTHER): Payer: Medicare HMO | Admitting: Podiatry

## 2022-01-25 ENCOUNTER — Other Ambulatory Visit: Payer: Self-pay

## 2022-01-25 DIAGNOSIS — B351 Tinea unguium: Secondary | ICD-10-CM

## 2022-01-25 DIAGNOSIS — M79675 Pain in left toe(s): Secondary | ICD-10-CM

## 2022-01-25 DIAGNOSIS — L989 Disorder of the skin and subcutaneous tissue, unspecified: Secondary | ICD-10-CM

## 2022-01-25 DIAGNOSIS — M79674 Pain in right toe(s): Secondary | ICD-10-CM

## 2022-01-25 NOTE — Progress Notes (Signed)
° °  SUBJECTIVE Patient presents to office today complaining of elongated, thickened nails that cause pain while ambulating in shoes.  He  is unable to trim his own nails. Patient is here for further evaluation and treatment.  Past Medical History:  Diagnosis Date   Anemia    Asthma    Atrial fibrillation (HCC)    CHF (congestive heart failure) (HCC)    COPD (chronic obstructive pulmonary disease) ([place])    Heart disease    Hypertension    Syncope     OBJECTIVE General Patient is awake, alert, and oriented x 3 and in no acute distress. Derm Skin is dry and supple bilateral. Negative open lesions or macerations. Remaining integument unremarkable. Nails are tender, long, thickened and dystrophic with subungual debris, consistent with onychomycosis, 1-5 bilateral. No signs of infection noted.  Hyperkeratotic preulcerative callus tissue also noted to the distal tips of the toes bilateral Vasc  DP and PT pedal pulses palpable bilaterally. Temperature gradient within normal limits.  Neuro Epicritic and protective threshold sensation grossly intact bilaterally.  Musculoskeletal Exam No symptomatic pedal deformities noted bilateral. Muscular strength within normal limits.  ASSESSMENT 1. Onychodystrophic nails 1-5 bilateral with hyperkeratosis of nails.  2. Onychomycosis of nail due to dermatophyte bilateral 3. Pain in foot bilateral 4.  Preulcerative calluses bilateral feet  PLAN OF CARE 1. Patient evaluated today.  2. Instructed to maintain good pedal hygiene and foot care.  3. Mechanical debridement of nails 1-5 bilaterally performed using a nail nipper. Filed with dremel without incident.  4.  Excisional debridement of the hyperkeratotic callus tissue was performed using a tissue nipper without incident or bleeding  5.  Return to clinic in 3 mos. for routine foot care   Edrick Kins, DPM [facility]  Dr. Edrick Kins, DPM    [address], [place] [address]                Office [phone]  Fax [phone]

## 2022-02-22 DIAGNOSIS — I10 Essential (primary) hypertension: Secondary | ICD-10-CM | POA: Diagnosis not present

## 2022-02-22 DIAGNOSIS — I2510 Atherosclerotic heart disease of native coronary artery without angina pectoris: Secondary | ICD-10-CM | POA: Diagnosis not present

## 2022-02-22 DIAGNOSIS — D649 Anemia, unspecified: Secondary | ICD-10-CM | POA: Diagnosis not present

## 2022-02-22 DIAGNOSIS — E785 Hyperlipidemia, unspecified: Secondary | ICD-10-CM | POA: Diagnosis not present

## 2022-03-08 ENCOUNTER — Other Ambulatory Visit: Payer: Self-pay

## 2022-03-08 ENCOUNTER — Ambulatory Visit (INDEPENDENT_AMBULATORY_CARE_PROVIDER_SITE_OTHER): Payer: Medicare HMO | Admitting: Neurology

## 2022-03-08 ENCOUNTER — Encounter: Payer: Self-pay | Admitting: Neurology

## 2022-03-08 VITALS — BP 144/87 | HR 82 | Ht 67.0 in | Wt 243.5 lb

## 2022-03-08 DIAGNOSIS — H9319 Tinnitus, unspecified ear: Secondary | ICD-10-CM | POA: Diagnosis not present

## 2022-03-08 DIAGNOSIS — R404 Transient alteration of awareness: Secondary | ICD-10-CM

## 2022-03-08 MED ORDER — LAMOTRIGINE 100 MG PO TABS: 100.0000 mg | ORAL_TABLET | Freq: Two times a day (BID) | ORAL | 11 refills | Status: DC

## 2022-03-08 MED ORDER — LAMOTRIGINE 25 MG PO TABS: ORAL_TABLET | ORAL | 0 refills | Status: DC

## 2022-03-08 NOTE — Progress Notes (Signed)
? ?Chief Complaint  ?Patient presents with  ? New Patient (Initial Visit)  ?  Rm 13. Alone. ?Nelta Numbers Internal referral for syncope/seizures. ?Reports 4 "blacking out" incidents in 2018. Before incident he reports lightheadedness and ringing.  ? ? ? ? ?ASSESSMENT AND PLAN ? ?[name] [name] is a 71 y.o. male   ?Recurrent episode of sudden passing out, ? There was also family history of epilepsy, different recurrent episodes such as uncontrollable right arm jerking, sudden onset loud cricket sound in his ear, lasting for few minutes, no loss of consciousness, ? Laboratory evaluation to rule out metabolic cause, ? MRI of the brain with without contrast ? EEG ? He has frequent recurrent episode, to the point of bothering his life quality, he is waiting to try treatment, for possible partial seizure, lamotrigine titrating to 100 mg twice a day ? ?DIAGNOSTIC DATA (LABS, IMAGING, TESTING) ?- I reviewed patient records, labs, notes, testing and imaging myself where available. ?Laboratory evaluations in July 2022, normal lipid panel, CMP showed elevated creatinine 1.38, A1c was 6.0, ? ?MEDICAL HISTORY: ? ?[name] [name] is a 72 year old male, seen in request by his primary care physician Dr. Charlott Rakes, MD ?For evaluation of passing out episode, ? ?I reviewed and summarized the referring note.PMHX. ?Atrial fibrillation, taking amiodarone digoxin ?Hypertension ?Hyperlipidemia ? ?He reported 1 episode in December 2022, he woke up from sleep using bathroom to urinate, he could remember he finish urination, then woke up on the back, apparently he fell to his right side into the adjacent bathtub, With the shower curtain all over him, ? ?He is sleep with TV on all night, based on the program, he thought he was out for about 10 minutes ? ?There was no self injury, he was able to get out of the bathtub, no tongue biting, no incontinence ? ?He lives by himself in an apartment, need to his people regularly does community  service.  He does wake up frequently during middle of the night, also complains of excessive daytime fatigue, sleepiness, taking frequent cat naps, ? ?I saw him previously in 2018 for passing out spells ? ?At that time he complains of intermittent episode of sudden onset right arm shaking, lasting for few minutes, no loss of consciousness, not generalized to left side, no right facial neck involvement, ? ?He does not have a family history of epilepsy, brother and father had a diagnosis of seizure presented with similar event ? ?At that time, extensive laboratory evaluation including MRI of the brain, EEG showed no significant abnormality. ?Laboratory evaluations showed no significant abnormality as well, I have suggested him empirically treated with Keppra, he is hesitate to stop the medications, loss of follow-up ? ? ?Over the past few years, he reported different spells, he often have sudden onset mild cricket ringing sound in his ear, lasting for few minutes, to the point that he called the past control, worry about the cricket in his apartment, but the reported cricket sound was not heard by other people, the recurrent episode happened about every 2 to 3 months, no loss of consciousness ? ?PHYSICAL EXAM: ?  ?Vitals:  ? 03/08/22 1123  ?BP: (!) 144/87  ?Pulse: 82  ?Weight: 243 lb 8 oz (110.5 kg)  ?Height: '5\' 7"'$  (1.702 m)  ? ?Not recorded ?  ? ? ?Body mass index is 38.14 kg/m?. ? ?PHYSICAL EXAMNIATION: ? ?Gen: NAD, conversant, well nourised, well groomed                     ?  Cardiovascular: Regular rate rhythm, no peripheral edema, warm, nontender. ?Eyes: Conjunctivae clear without exudates or hemorrhage ?Neck: Supple, no carotid bruits. ?Pulmonary: Clear to auscultation bilaterally  ? ?NEUROLOGICAL EXAM: ? ?MENTAL STATUS: ?Speech: ?   Speech is normal; fluent and spontaneous with normal comprehension.  ?Cognition: ?    Orientation to time, place and person ?    Normal recent and remote memory ?    Normal Attention  span and concentration ?    Normal Language, naming, repeating,spontaneous speech ?    Fund of knowledge ?  ?CRANIAL NERVES: ?CN II: Visual fields are full to confrontation. Pupils are round equal and briskly reactive to light. ?CN III, IV, VI: extraocular movement are normal. No ptosis. ?CN V: Facial sensation is intact to light touch ?CN VII: Face is symmetric with normal eye closure  ?CN VIII: Hearing is normal to causal conversation. ?CN IX, X: Phonation is normal. ?CN XI: Head turning and shoulder shrug are intact ? ?MOTOR: ?There is no pronator drift of out-stretched arms. Muscle bulk and tone are normal. Muscle strength is normal. ? ?REFLEXES: ?Reflexes are 2+ and symmetric at the biceps, triceps, knees, and ankles. Plantar responses are flexor. ? ?SENSORY: ?Intact to light touch, pinprick and vibratory sensation are intact in fingers and toes. ? ?COORDINATION: ?There is no trunk or limb dysmetria noted. ? ?GAIT/STANCE: ?Posture is normal. Gait is steady with normal steps, base, arm swing, and turning. Heel and toe walking are normal. Tandem gait is normal.  ?Romberg is absent. ? ?REVIEW OF SYSTEMS:  ?Full 14 system review of systems performed and notable only for as above ?All other review of systems were negative. ? ? ?ALLERGIES: ?No Known Allergies ? ?HOME MEDICATIONS: ?Current Outpatient Medications  ?Medication Sig Dispense Refill  ? acetaminophen (TYLENOL) 325 MG tablet Take 2 tablets (650 mg total) by mouth every 6 (six) hours as needed for mild pain (or Fever >/= 101). 30 tablet 0  ? albuterol (PROVENTIL HFA;VENTOLIN HFA) 108 (90 Base) MCG/ACT inhaler Inhale 2 puffs into the lungs every 6 (six) hours as needed. 1 Inhaler 5  ? albuterol (PROVENTIL) (2.5 MG/3ML) 0.083% nebulizer solution Take 3 mLs (2.5 mg total) by nebulization every 6 (six) hours as needed for wheezing or shortness of breath. 150 mL 1  ? allopurinol (ZYLOPRIM) 300 MG tablet Take 1 tablet (300 mg total) by mouth daily. 90 tablet 1  ?  amiodarone (PACERONE) 200 MG tablet Take 1 tablet (200 mg total) by mouth 2 (two) times daily. 60 tablet 2  ? ARNUITY ELLIPTA 100 MCG/ACT AEPB TAKE 1 PUFF BY MOUTH EVERY DAY 30 each 0  ? ARNUITY ELLIPTA 100 MCG/ACT AEPB Inhale 1 puff into the lungs daily. 30 each 3  ? aspirin EC 81 MG tablet Take 81 mg by mouth daily.    ? atorvastatin (LIPITOR) 40 MG tablet Take 1 tablet (40 mg total) by mouth daily. 90 tablet 1  ? clomiPHENE (CLOMID) 50 MG tablet 1/4 tab daily    ? clotrimazole-betamethasone (LOTRISONE) cream Apply 1 application topically 2 (two) times daily as needed (FOR RASH).    ? diclofenac Sodium (VOLTAREN) 1 % GEL Apply 4 g topically 4 (four) times daily. 100 g 1  ? digoxin (LANOXIN) 0.125 MG tablet Take 1 tablet (0.125 mg total) by mouth daily. 30 tablet 3  ? ferrous sulfate 325 (65 FE) MG tablet Take 325 mg by mouth daily with breakfast. Reported on 03/16/2016    ? furosemide (LASIX) 20 MG tablet TAKE  1 TABLET BY MOUTH EVERY DAY 90 tablet 1  ? losartan (COZAAR) 100 MG tablet Take 100 mg by mouth daily.  3  ? methocarbamol (ROBAXIN) 500 MG tablet TAKE 1 TABLET (500 MG TOTAL) BY MOUTH EVERY 8 (EIGHT) HOURS AS NEEDED FOR MUSCLE SPASM/PAIN 90 tablet 0  ? metoprolol succinate (TOPROL-XL) 25 MG 24 hr tablet Take 1 tablet (25 mg total) by mouth daily. 90 tablet 1  ? nitroGLYCERIN (NITROSTAT) 0.4 MG SL tablet Place 1 tablet (0.4 mg total) under the tongue every 5 (five) minutes as needed for chest pain. 25 tablet 1  ? pantoprazole (PROTONIX) 40 MG tablet Take 1 tablet (40 mg total) by mouth daily. 90 tablet 1  ? potassium chloride (K-DUR) 10 MEQ tablet Take 10 mEq by mouth daily.    ? sildenafil (VIAGRA) 100 MG tablet Take 1 tablet (100 mg total) by mouth daily as needed for erectile dysfunction. 10 tablet 11  ? tacrolimus (PROTOPIC) 0.1 % ointment     ? tamoxifen (NOLVADEX) 10 MG tablet Take 1 tablet (10 mg total) by mouth 2 (two) times daily. 180 tablet 3  ? tamsulosin (FLOMAX) 0.4 MG CAPS capsule Take 1 capsule  (0.4 mg total) by mouth daily. 90 capsule 1  ? ?No current facility-administered medications for this visit.  ? ? ?PAST MEDICAL HISTORY: ?Past Medical History:  ?Diagnosis Date  ? Anemia   ? Asthma   ? A

## 2022-03-11 ENCOUNTER — Encounter: Payer: Self-pay | Admitting: Neurology

## 2022-03-13 ENCOUNTER — Telehealth: Payer: Self-pay | Admitting: Neurology

## 2022-03-13 LAB — DRUG SCREEN 10 W/CONF, SERUM
Amphetamines, IA: NEGATIVE ng/mL
Barbiturates, IA: NEGATIVE ug/mL
Benzodiazepines, IA: NEGATIVE ng/mL
Cocaine & Metabolite, IA: NEGATIVE ng/mL
Methadone, IA: NEGATIVE ng/mL
Opiates, IA: NEGATIVE ng/mL
Oxycodones, IA: NEGATIVE ng/mL
Phencyclidine, IA: NEGATIVE ng/mL
Propoxyphene, IA: NEGATIVE ng/mL
THC(Marijuana) Metabolite, IA: NEGATIVE ng/mL

## 2022-03-13 LAB — CBC WITH DIFFERENTIAL/PLATELET
Basophils Absolute: 0.1 10*3/uL (ref 0.0–0.2)
Basos: 1 %
EOS (ABSOLUTE): 0.6 10*3/uL — ABNORMAL HIGH (ref 0.0–0.4)
Eos: 8 %
Hematocrit: 37.6 % (ref 37.5–51.0)
Hemoglobin: 11.5 g/dL — ABNORMAL LOW (ref 13.0–17.7)
Immature Grans (Abs): 0 10*3/uL (ref 0.0–0.1)
Immature Granulocytes: 0 %
Lymphocytes Absolute: 2.4 10*3/uL (ref 0.7–3.1)
Lymphs: 31 %
MCH: 21.6 pg — ABNORMAL LOW (ref 26.6–33.0)
MCHC: 30.6 g/dL — ABNORMAL LOW (ref 31.5–35.7)
MCV: 71 fL — ABNORMAL LOW (ref 79–97)
Monocytes Absolute: 1.1 10*3/uL — ABNORMAL HIGH (ref 0.1–0.9)
Monocytes: 14 %
Neutrophils Absolute: 3.6 10*3/uL (ref 1.4–7.0)
Neutrophils: 46 %
Platelets: 357 10*3/uL (ref 150–450)
RBC: 5.33 x10E6/uL (ref 4.14–5.80)
RDW: 16 % — ABNORMAL HIGH (ref 11.6–15.4)
WBC: 7.8 10*3/uL (ref 3.4–10.8)

## 2022-03-13 LAB — COMPREHENSIVE METABOLIC PANEL
ALT: 14 IU/L (ref 0–44)
AST: 24 IU/L (ref 0–40)
Albumin/Globulin Ratio: 1 — ABNORMAL LOW (ref 1.2–2.2)
Albumin: 3.8 g/dL (ref 3.7–4.7)
Alkaline Phosphatase: 102 IU/L (ref 44–121)
BUN/Creatinine Ratio: 10 (ref 10–24)
BUN: 11 mg/dL (ref 8–27)
Bilirubin Total: 0.4 mg/dL (ref 0.0–1.2)
CO2: 27 mmol/L (ref 20–29)
Calcium: 9 mg/dL (ref 8.6–10.2)
Chloride: 102 mmol/L (ref 96–106)
Creatinine, Ser: 1.12 mg/dL (ref 0.76–1.27)
Globulin, Total: 3.8 g/dL (ref 1.5–4.5)
Glucose: 88 mg/dL (ref 70–99)
Potassium: 4 mmol/L (ref 3.5–5.2)
Sodium: 140 mmol/L (ref 134–144)
Total Protein: 7.6 g/dL (ref 6.0–8.5)
eGFR: 69 mL/min/{1.73_m2} (ref >59)

## 2022-03-13 LAB — ETHANOL

## 2022-03-13 NOTE — Telephone Encounter (Signed)
Please call patient, laboratory evaluation showed mild anemia, hemoglobin of 11.5, with elevated RDW, suggestive of iron deficiency anemia, which has been present since 2020 ? ?Rest of the laboratory evaluation showed no significant abnormalities ? ?I have forwarded lab result to his primary care Charlott Rakes, MD he should contact him for follow-up. ?

## 2022-03-13 NOTE — Telephone Encounter (Signed)
[name] [name] [name] [name]: [id number] (exp. 03/13/22 to 04/12/22) order sent to GI, they will reach out to the patient to schedule.  ?

## 2022-03-14 NOTE — Telephone Encounter (Signed)
I called patient. I discussed his lab results and recommendations. Pt verbalized understanding of results. Pt had no questions at this time but was encouraged to call back if questions arise. ? ?

## 2022-03-16 ENCOUNTER — Ambulatory Visit (INDEPENDENT_AMBULATORY_CARE_PROVIDER_SITE_OTHER): Payer: Medicare HMO | Admitting: Neurology

## 2022-03-16 DIAGNOSIS — R404 Transient alteration of awareness: Secondary | ICD-10-CM

## 2022-03-16 DIAGNOSIS — R55 Syncope and collapse: Secondary | ICD-10-CM | POA: Diagnosis not present

## 2022-03-16 DIAGNOSIS — H9319 Tinnitus, unspecified ear: Secondary | ICD-10-CM

## 2022-03-23 ENCOUNTER — Other Ambulatory Visit: Payer: Self-pay | Admitting: Family Medicine

## 2022-03-23 DIAGNOSIS — I5032 Chronic diastolic (congestive) heart failure: Secondary | ICD-10-CM

## 2022-04-02 NOTE — Procedures (Signed)
? ?  HISTORY: 71 year old male presented with sudden onset of passing out ? ?TECHNIQUE:  ?This is a routine 16 channel EEG recording with one channel devoted to a limited EKG recording.  It was performed during wakefulness, drowsiness and asleep.  Hyperventilation and photic stimulation were performed as activating procedures.  There are minimum muscle and movement artifact noted. ? ?Upon maximum arousal, posterior dominant waking rhythm consistent of rhythmic alpha range activity. Activities are symmetric over the bilateral posterior derivations and attenuated with eye opening. ? ?Hyperventilation produced mild/moderate buildup with higher amplitude and the slower activities noted. ? ?Photic stimulation did not alter the tracing. ? ?During EEG recording, patient developed drowsiness and entered no deeper stage of sleep was achieved ? ?During EEG recording, there was no epileptiform discharge noted. ? ?EKG demonstrate sinus rhythm, with heart rate of 60 bpm ? ?CONCLUSION: ?This is a  normal awake EEG.  There is no electrodiagnostic evidence of epileptiform discharge. ? ?Marcial Pacas, M.D. Ph.D. ? ?[facility] ?[place], [place] [address] ?Phone: [phone] ?Fax:      [phone] ? ?

## 2022-04-03 ENCOUNTER — Telehealth: Payer: Self-pay | Admitting: *Deleted

## 2022-04-03 NOTE — Telephone Encounter (Signed)
Spoke with pt and provided normal results. Pt verbalized understanding and expressed appreciation for the call. ?

## 2022-04-03 NOTE — Telephone Encounter (Signed)
-----   Message from Marcial Pacas, MD sent at 04/02/2022 11:06 PM EDT ----- ?Please call patient, normal EEG ?

## 2022-04-07 ENCOUNTER — Ambulatory Visit
Admission: RE | Admit: 2022-04-07 | Discharge: 2022-04-07 | Disposition: A | Discharge status: Home / Self Care | Payer: Medicare HMO | Source: Ambulatory Visit | Attending: Neurology | Admitting: Neurology

## 2022-04-07 DIAGNOSIS — R404 Transient alteration of awareness: Secondary | ICD-10-CM | POA: Diagnosis not present

## 2022-04-07 DIAGNOSIS — H9319 Tinnitus, unspecified ear: Secondary | ICD-10-CM

## 2022-04-07 MED ORDER — GADOBENATE DIMEGLUMINE 529 MG/ML IV SOLN
20.0000 mL | Freq: Once | INTRAVENOUS | Status: AC | PRN
  Administered 2022-04-07: 20 mL via INTRAVENOUS

## 2022-04-10 ENCOUNTER — Telehealth: Payer: Self-pay | Admitting: Neurology

## 2022-04-10 NOTE — Telephone Encounter (Signed)
Left second message for a return call. ?

## 2022-04-10 NOTE — Telephone Encounter (Signed)
I called patient. I discussed his MRI results. He will let us know if he has another syncopal episode. He has not started lamotrigine but plans on starting this soon. I advised him to let us know if he has side effects with lamotrigine including a rash. He will follow up as scheduled. Pt verbalized understanding of results. Pt had no questions at this time but was encouraged to call back if questions arise. ? ?

## 2022-04-10 NOTE — Telephone Encounter (Signed)
Please call patient MRI of the brain showed expected evolution with aging, there is no significant change compared with previous MRI in 2015 ? ? ? ? ?IMPRESSION: This MRI of the brain with and without contrast shows the following: ?1.   Mild generalized cortical atrophy, just minimally increased compared to the 2015 MRI and likely normal for age. ?2.   Some scattered T2/FLAIR hyperintense foci in the cerebral hemispheres in the pons consistent with mild chronic microvascular ischemic change, essentially unchanged compared to the 2015 MRI. ?3.   No acute findings.  Normal enhancement pattern. ?

## 2022-04-10 NOTE — Telephone Encounter (Signed)
I called patient to discuss. No answer, left a message asking him to call us back.  

## 2022-04-11 ENCOUNTER — Ambulatory Visit: Payer: Medicare HMO | Attending: Family Medicine | Admitting: Family Medicine

## 2022-04-11 ENCOUNTER — Encounter: Payer: Self-pay | Admitting: Family Medicine

## 2022-04-11 VITALS — BP 148/76 | HR 82 | Ht 67.0 in | Wt 241.4 lb

## 2022-04-11 DIAGNOSIS — R251 Tremor, unspecified: Secondary | ICD-10-CM

## 2022-04-11 DIAGNOSIS — Z1211 Encounter for screening for malignant neoplasm of colon: Secondary | ICD-10-CM

## 2022-04-11 DIAGNOSIS — I5032 Chronic diastolic (congestive) heart failure: Secondary | ICD-10-CM | POA: Diagnosis not present

## 2022-04-11 DIAGNOSIS — I110 Hypertensive heart disease with heart failure: Secondary | ICD-10-CM

## 2022-04-11 DIAGNOSIS — R569 Unspecified convulsions: Secondary | ICD-10-CM

## 2022-04-11 DIAGNOSIS — J438 Other emphysema: Secondary | ICD-10-CM

## 2022-04-11 MED ORDER — EMPAGLIFLOZIN 10 MG PO TABS: 10.0000 mg | ORAL_TABLET | Freq: Every day | ORAL | 1 refills | Status: DC

## 2022-04-11 NOTE — Progress Notes (Signed)
? ?Subjective:  ?Patient ID: [name] [name], male    DOB: 1951-07-28  Age: 71 y.o. MRN: [id number] ? ?CC: Hypertension ? ? ?HPI ?[name] [name] is a 71 y.o. year old male with a history of paroxysmal A. fib, atrial flutter, diastolic congestive heart failure (EF 55-60% in 11/2017), CAD (status post cardiac cath in 11/2015 - mid LAD 20% stenosis, dist LAD 40% stenosis, Ost RCA to prox RCA 30% stenosis, normal LV systolic function), sick sinus syndrome, COPD, GERD, seizures (previously managed by [facility]), BPH, hypogonadism (managed by endocrine),Gout here for a follow up visit. ? ?Interval History: ?He was evaluated by [facility] due to recurrent syncopal episodes and EEG was normal.  He was placed on Lamictal. ?He has not had any additional syncopal episodes  ?He has noticed spontaneous jerking of his R arm and states his brother and Dad had it in the past. He did not mention this to Neurology at his last visit. Last episode occurred yesterday. ? ?He is closely followed by his cardiologist Dr. Terrence Dupont and is dyspneic only when he exerts himself like walking to his mailbox.  He has no chest pain or pedal edema. ? ?COPD is managed by pulmonary. ?Past Medical History:  ?Diagnosis Date  ? Anemia   ? Asthma   ? Atrial fibrillation ([place])   ? CHF (congestive heart failure) ([place])   ? COPD (chronic obstructive pulmonary disease) ([place])   ? Heart disease   ? Hypertension   ? Syncope   ? ? ?Past Surgical History:  ?Procedure Laterality Date  ? CARDIAC CATHETERIZATION N/A 12/08/2015  ? Procedure: Left Heart Cath and Coronary Angiography;  Surgeon: Charolette Forward, MD;  Location: [facility];  Service: Cardiovascular;  Laterality: N/A;  ? ESOPHAGUS SURGERY    ? SHOULDER CLOSED REDUCTION Left 12/01/2017  ? Procedure: CLOSED REDUCTION SHOULDER;  Surgeon: Altamese Rolling Hills, MD;  Location: [facility];  Service: Orthopedics;  Laterality: Left;  ? ? ?Family History  ?Problem Relation Age of Onset  ? Cancer Mother    ? ? ?Social History  ? ?Socioeconomic History  ? Marital status: Single  ?  Spouse name: Not on file  ? Number of children: 1  ? Years of education: college  ? Highest education level: Not on file  ?Occupational History  ? 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  ?  Employer: [employer]  ?Tobacco Use  ? Smoking status: Never  ? Smokeless tobacco: Never  ?Vaping Use  ? Vaping Use: Never used  ?Substance and Sexual Activity  ? Alcohol use: No  ?  Alcohol/week: 0.0 standard drinks  ? Drug use: No  ? Sexual activity: Not Currently  ?Other Topics Concern  ? Not on file  ?Social History Narrative  ? Patient lives at home alone and he is single.  ? Patient is semi retired.   ? Education college.  ? Right handed.  ? Caffeine two cokes daily.  ? ?Social Determinants of Health  ? ?Financial Resource Strain: Low Risk   ? Difficulty of Paying Living Expenses: Not hard at all  ?Food Insecurity: No Food Insecurity  ? Worried About Charity fundraiser in the Last Year: Never true  ? Ran Out of Food in the Last Year: Never true  ?Transportation Needs: No Transportation Needs  ? Lack of Transportation (Medical): No  ? Lack of Transportation (Non-Medical): No  ?Physical Activity: Insufficiently Active  ? Days of Exercise per Week: 7 days  ? Minutes of Exercise per  Session: 10 min  ?Stress: No Stress Concern Present  ? Feeling of Stress : Not at all  ?Social Connections: Moderately Isolated  ? Frequency of Communication with Friends and Family: More than three times a week  ? Frequency of Social Gatherings with Friends and Family: More than three times a week  ? Attends Religious Services: More than 4 times per year  ? Active Member of Clubs or Organizations: No  ? Attends Archivist Meetings: Never  ? Marital Status: Never married  ? ? ?No Known Allergies ? ?Outpatient Medications Prior to Visit  ?Medication Sig Dispense Refill  ? acetaminophen (TYLENOL) 325 MG tablet Take 2 tablets (650 mg total) by mouth every 6 (six) hours as  needed for mild pain (or Fever >/= 101). 30 tablet 0  ? albuterol (PROVENTIL HFA;VENTOLIN HFA) 108 (90 Base) MCG/ACT inhaler Inhale 2 puffs into the lungs every 6 (six) hours as needed. 1 Inhaler 5  ? albuterol (PROVENTIL) (2.5 MG/3ML) 0.083% nebulizer solution Take 3 mLs (2.5 mg total) by nebulization every 6 (six) hours as needed for wheezing or shortness of breath. 150 mL 1  ? allopurinol (ZYLOPRIM) 300 MG tablet Take 1 tablet (300 mg total) by mouth daily. 90 tablet 1  ? amiodarone (PACERONE) 200 MG tablet Take 1 tablet (200 mg total) by mouth 2 (two) times daily. 60 tablet 2  ? ARNUITY ELLIPTA 100 MCG/ACT AEPB Inhale 1 puff into the lungs daily. 30 each 3  ? aspirin EC 81 MG tablet Take 81 mg by mouth daily.    ? atorvastatin (LIPITOR) 40 MG tablet Take 1 tablet (40 mg total) by mouth daily. 90 tablet 1  ? clomiPHENE (CLOMID) 50 MG tablet 1/4 tab daily    ? clotrimazole-betamethasone (LOTRISONE) cream Apply 1 application topically 2 (two) times daily as needed (FOR RASH).    ? diclofenac Sodium (VOLTAREN) 1 % GEL Apply 4 g topically 4 (four) times daily. 100 g 1  ? digoxin (LANOXIN) 0.125 MG tablet Take 1 tablet (0.125 mg total) by mouth daily. 30 tablet 3  ? ferrous sulfate 325 (65 FE) MG tablet Take 325 mg by mouth daily with breakfast. Reported on 03/16/2016    ? furosemide (LASIX) 20 MG tablet TAKE 1 TABLET BY MOUTH EVERY DAY 30 tablet 0  ? lamoTRIgine (LAMICTAL) 100 MG tablet Take 1 tablet (100 mg total) by mouth 2 (two) times daily. 60 tablet 11  ? lamoTRIgine (LAMICTAL) 25 MG tablet 1 tab bid x one week ?2 tab bid x 2nd week ?3 tab bid x 3rd week 84 tablet 0  ? losartan (COZAAR) 100 MG tablet Take 100 mg by mouth daily.  3  ? methocarbamol (ROBAXIN) 500 MG tablet TAKE 1 TABLET (500 MG TOTAL) BY MOUTH EVERY 8 (EIGHT) HOURS AS NEEDED FOR MUSCLE SPASM/PAIN 90 tablet 0  ? metoprolol succinate (TOPROL-XL) 25 MG 24 hr tablet Take 1 tablet (25 mg total) by mouth daily. 90 tablet 1  ? nitroGLYCERIN (NITROSTAT)  0.4 MG SL tablet Place 1 tablet (0.4 mg total) under the tongue every 5 (five) minutes as needed for chest pain. 25 tablet 1  ? pantoprazole (PROTONIX) 40 MG tablet Take 1 tablet (40 mg total) by mouth daily. 90 tablet 1  ? potassium chloride (K-DUR) 10 MEQ tablet Take 10 mEq by mouth daily.    ? sildenafil (VIAGRA) 100 MG tablet Take 1 tablet (100 mg total) by mouth daily as needed for erectile dysfunction. 10 tablet 11  ?  tacrolimus (PROTOPIC) 0.1 % ointment     ? tamoxifen (NOLVADEX) 10 MG tablet Take 1 tablet (10 mg total) by mouth 2 (two) times daily. 180 tablet 3  ? tamsulosin (FLOMAX) 0.4 MG CAPS capsule Take 1 capsule (0.4 mg total) by mouth daily. 90 capsule 1  ? ?No facility-administered medications prior to visit.  ? ? ? ?ROS ?Review of Systems  ?Constitutional:  Negative for activity change and appetite change.  ?HENT:  Negative for sinus pressure and sore throat.   ?Respiratory:  Negative for chest tightness, shortness of breath and wheezing.   ?Cardiovascular:  Negative for chest pain and palpitations.  ?Gastrointestinal:  Negative for abdominal distention, abdominal pain and constipation.  ?Genitourinary: Negative.   ?Musculoskeletal: Negative.   ?Neurological:  Positive for tremors.  ?Psychiatric/Behavioral:  Negative for behavioral problems and dysphoric mood.   ? ?Objective:  ?BP (!) 148/76   Pulse 82   Ht '5\' 7"'$  (1.702 m)   Wt 241 lb 6.4 oz (109.5 kg)   SpO2 97%   BMI 37.81 kg/m?  ? ? ?  04/11/2022  ?  2:27 PM 03/08/2022  ? 11:23 AM 01/11/2022  ? 11:28 AM  ?BP/Weight  ?Systolic BP [phone]  ?Diastolic BP 76 87 83  ?Wt. (Lbs) 241.4 243.5 238.4  ?BMI 37.81 kg/m2 38.14 kg/m2 37.34 kg/m2  ? ? ? ? ?Physical Exam ?Constitutional:   ?   Appearance: He is well-developed. He is obese.  ?Cardiovascular:  ?   Rate and Rhythm: Normal rate.  ?   Heart sounds: Normal heart sounds. No murmur heard. ?Pulmonary:  ?   Effort: Pulmonary effort is normal.  ?   Breath sounds: Normal breath sounds. No wheezing or  rales.  ?   Comments: Noticed to be dyspneic on transferring from the chair to the exam table ?Chest:  ?   Chest wall: No tenderness.  ?Abdominal:  ?   General: Bowel sounds are normal. There is no distension.  ?   Palpation

## 2022-04-11 NOTE — Patient Instructions (Signed)
Empagliflozin Tablets What is this medication? EMPAGLIFLOZIN (EM pa gli FLOE zin) treats type 2 diabetes. It works by helping your kidneys remove sugar (glucose) from your blood through the urine, which decreases your blood sugar. It can also be used to lower the risk of heart attack, stroke, and hospitalization for heart failure in people with type 2 diabetes. Changes to diet and exercise are often combined with this medication. This medicine may be used for other purposes; ask your health care provider or pharmacist if you have questions. COMMON BRAND NAME(S): Jardiance What should I tell my care team before I take this medication? They need to know if you have any of these conditions: Dehydration Diabetic ketoacidosis Diet low in salt Eating less due to illness, surgery, dieting, or any other reason Frequently drink alcohol Having surgery High cholesterol High levels of potassium in the blood History of pancreatitis or pancreas problems History of yeast infection of the penis or vagina Infections in the bladder, kidneys, or urinary tract Kidney disease Liver disease Low blood pressure On hemodialysis Problems urinating Type 1 diabetes Uncircumcised male An unusual or allergic reaction to empagliflozin, other medications, foods, dyes, or preservatives Pregnant or trying to get pregnant Breast-feeding How should I use this medication? Take this medication by mouth with water. Take it as directed on the prescription label at the same time every day. You may take it with or without food. Keep taking it unless your care team tells you to stop. A special MedGuide will be given to you by the pharmacist with each prescription and refill. Be sure to read this information carefully each time. Talk to your care team about the use of this medication in children. Special care may be needed. Overdosage: If you think you have taken too much of this medicine contact a poison control center or  emergency room at once. NOTE: This medicine is only for you. Do not share this medicine with others. What if I miss a dose? If you miss a dose, take it as soon as you can. If it is almost time for your next dose, take only that dose. Do not take double or extra doses. What may interact with this medication? Alcohol Diuretics Insulin Lithium This list may not describe all possible interactions. Give your health care provider a list of all the medicines, herbs, non-prescription drugs, or dietary supplements you use. Also tell them if you smoke, drink alcohol, or use illegal drugs. Some items may interact with your medicine. What should I watch for while using this medication? Visit your care team for regular checks on your progress. Tell your care team if your symptoms do not start to get better or if they get worse. This medication can cause a serious condition in which there is too much acid in the blood. If you develop nausea, vomiting, stomach pain, unusual tiredness, or breathing problems, stop taking this medication and call your care team right away. If possible, use a ketone dipstick to check for ketones in your urine. Check with your care team if you have severe diarrhea, nausea, and vomiting, or if you sweat a lot. The loss of too much body fluid may make it dangerous for you to take this medication. A test called the HbA1C (A1C) will be monitored. This is a simple blood test. It measures your blood sugar control over the last 2 to 3 months. You will receive this test every 3 to 6 months. Learn how to check your blood sugar. Learn   the symptoms of low and high blood sugar and how to manage them. Always carry a quick-source of sugar with you in case you have symptoms of low blood sugar. Examples include hard sugar candy or glucose tablets. Make sure others know that you can choke if you eat or drink when you develop serious symptoms of low blood sugar, such as seizures or unconsciousness. Get  medical help at once. Tell your care team if you have high blood sugar. You might need to change the dose of your medication. If you are sick or exercising more than usual, you may need to change the dose of your medication. What side effects may I notice from receiving this medication? Side effects that you should report to your care team as soon as possible: Allergic reactions--skin rash, itching, hives, swelling of the face, lips, tongue, or throat Dehydration--increased thirst, dry mouth, feeling faint or lightheaded, headache, dark yellow or brown urine Diabetic ketoacidosis (DKA)--increased thirst or amount of urine, dry mouth, fatigue, fruity odor to breath, trouble breathing, stomach pain, nausea, vomiting Genital yeast infection--redness, swelling, pain, or itchiness, odor, thick or lumpy discharge New pain or tenderness, change in skin color, sores or ulcers, infection of the leg or foot Infection or redness, swelling, tenderness, or pain in the genitals, or area from the genitals to the back of the rectum Urinary tract infection (UTI)--burning when passing urine, passing frequent small amounts of urine, bloody or cloudy urine, pain in the lower back or sides This list may not describe all possible side effects. Call your doctor for medical advice about side effects. You may report side effects to FDA at 1-800-FDA-1088. Where should I keep my medication? Keep out of the reach of children and pets. Store at room temperature between 20 and 25 degrees C (68 and 77 degrees F). Get rid of any unused medication after the expiration date. To get rid of medications that are no longer needed or have expired: Take the medication to a medication take-back program. Check with your pharmacy or law enforcement to find a location. If you cannot return the medication, check the label or package insert to see if the medication should be thrown out in the garbage or flushed down the toilet. If you are not  sure, ask your care team. If it is safe to put it in the trash, take the medication out of the container. Mix the medication with cat litter, dirt, coffee grounds, or other unwanted substance. Seal the mixture in a bag or container. Put it in the trash. NOTE: This sheet is a summary. It may not cover all possible information. If you have questions about this medicine, talk to your doctor, pharmacist, or health care provider.  2023 Elsevier/Gold Standard (2021-10-13 00:00:00)  

## 2022-04-18 ENCOUNTER — Ambulatory Visit: Payer: Medicare HMO | Admitting: Endocrinology

## 2022-04-18 ENCOUNTER — Other Ambulatory Visit: Payer: Self-pay | Admitting: Neurology

## 2022-04-26 ENCOUNTER — Ambulatory Visit: Payer: Medicare HMO | Admitting: Podiatry

## 2022-04-27 ENCOUNTER — Ambulatory Visit: Payer: Medicare HMO | Admitting: Endocrinology

## 2022-04-29 ENCOUNTER — Other Ambulatory Visit: Payer: Self-pay | Admitting: Family Medicine

## 2022-04-29 DIAGNOSIS — I5032 Chronic diastolic (congestive) heart failure: Secondary | ICD-10-CM

## 2022-05-05 ENCOUNTER — Ambulatory Visit (INDEPENDENT_AMBULATORY_CARE_PROVIDER_SITE_OTHER): Payer: Medicare HMO | Admitting: Podiatry

## 2022-05-05 ENCOUNTER — Encounter: Payer: Self-pay | Admitting: Podiatry

## 2022-05-05 DIAGNOSIS — M79675 Pain in left toe(s): Secondary | ICD-10-CM | POA: Diagnosis not present

## 2022-05-05 DIAGNOSIS — M79674 Pain in right toe(s): Secondary | ICD-10-CM | POA: Diagnosis not present

## 2022-05-05 DIAGNOSIS — B351 Tinea unguium: Secondary | ICD-10-CM

## 2022-05-05 NOTE — Progress Notes (Signed)
This patient presents to the office with chief complaint of long thick painful nails.  Patient says the nails are painful walking and wearing shoes.  This patient is unable to self treat.  This patient is unable to trim his nails since she is unable to reach his nails.  She presents to the office for preventative foot care services.  General Appearance  Alert, conversant and in no acute stress.  Vascular  Dorsalis pedis and posterior tibial  pulses are palpable  bilaterally.  Capillary return is within normal limits  bilaterally. Temperature is within normal limits  bilaterally.  Neurologic  Senn-Weinstein monofilament wire test within normal limits  bilaterally. Muscle power within normal limits bilaterally.  Nails Thick disfigured discolored nails with subungual debris  from hallux to fifth toes bilaterally. No evidence of bacterial infection or drainage bilaterally.  Orthopedic  No limitations of motion  feet .  No crepitus or effusions noted.  No bony pathology or digital deformities noted.  Skin  normotropic skin with no porokeratosis noted bilaterally.  No signs of infections or ulcers noted.     Onychomycosis  Nails  B/L.  Pain in right toes  Pain in left toes  Debridement of nails both feet followed trimming the nails with dremel tool.    RTC 3 months.   Jarelly Rinck DPM   

## 2022-05-26 DIAGNOSIS — E785 Hyperlipidemia, unspecified: Secondary | ICD-10-CM | POA: Diagnosis not present

## 2022-05-26 DIAGNOSIS — I2510 Atherosclerotic heart disease of native coronary artery without angina pectoris: Secondary | ICD-10-CM | POA: Diagnosis not present

## 2022-05-26 DIAGNOSIS — I10 Essential (primary) hypertension: Secondary | ICD-10-CM | POA: Diagnosis not present

## 2022-05-26 DIAGNOSIS — R7303 Prediabetes: Secondary | ICD-10-CM | POA: Diagnosis not present

## 2022-06-07 DIAGNOSIS — L259 Unspecified contact dermatitis, unspecified cause: Secondary | ICD-10-CM | POA: Diagnosis not present

## 2022-06-07 DIAGNOSIS — D485 Neoplasm of uncertain behavior of skin: Secondary | ICD-10-CM | POA: Diagnosis not present

## 2022-06-09 DIAGNOSIS — D485 Neoplasm of uncertain behavior of skin: Secondary | ICD-10-CM | POA: Diagnosis not present

## 2022-07-10 NOTE — Progress Notes (Unsigned)
[facility] [address] [place]. [place] [address] [phone]       OFFICE FOLLOW UP NOTE  [name] Date of Birth:  03-05-1951 Medical Record Number:  [id number]    Primary neurologist: Dr. Krista Blue Reason for visit: Altered consciousness possible partial seizure    SUBJECTIVE:   CHIEF COMPLAINT:  No chief complaint on file.   HPI:   Update 07/10/2022 JM: Patient returns for follow-up visit after prior initial visit 4 months ago with Dr. Krista Blue for passing out episode and initiated lamotrigine.  Extensive work-up including MRI brain, EEG and lab work largely unremarkable.  He has not had any additional syncopal events.  Doing well on lamotrigine 100 mg twice daily, denies side effects.      MRI brain w wo contrast 04/07/2022 IMPRESSION: This MRI of the brain with and without contrast shows the following: 1.   Mild generalized cortical atrophy, just minimally increased compared to the 2015 MRI and likely normal for age. 2.   Some scattered T2/FLAIR hyperintense foci in the cerebral hemispheres in the pons consistent with mild chronic microvascular ischemic change, essentially unchanged compared to the 2015 MRI. 3.   No acute findings.  Normal enhancement pattern.  EEG 03/16/2022 CONCLUSION: This is a  normal awake EEG.  There is no electrodiagnostic evidence of epileptiform discharge.    History provided for reference purposes only Consult visit 03/08/2022 Dr. Krista Blue: [name] [name] is a 71 year old male, seen in request by his primary care physician Dr. Charlott Rakes, MD For evaluation of passing out episode,   I reviewed and summarized the referring note.PMHX. Atrial fibrillation, taking amiodarone digoxin Hypertension Hyperlipidemia   He reported 1 episode in December 2022, he woke up from sleep using bathroom to urinate, he could remember he finish urination, then woke up on the back, apparently he fell to his right side into the adjacent  bathtub, With the shower curtain all over him,   He is sleep with TV on all night, based on the program, he thought he was out for about 10 minutes  There was no self injury, he was able to get out of the bathtub, no tongue biting, no incontinence  He lives by himself in an apartment, need to his people regularly does community service.  He does wake up frequently during middle of the night, also complains of excessive daytime fatigue, sleepiness, taking frequent cat naps,   I saw him previously in 2018 for passing out spells  At that time he complains of intermittent episode of sudden onset right arm shaking, lasting for few minutes, no loss of consciousness, not generalized to left side, no right facial neck involvement,  He does not have a family history of epilepsy, brother and father had a diagnosis of seizure presented with similar event  At that time, extensive laboratory evaluation including MRI of the brain, EEG showed no significant abnormality. Laboratory evaluations showed no significant abnormality as well, I have suggested him empirically treated with Keppra, he is hesitate to stop the medications, loss of follow-up    Over the past few years, he reported different spells, he often have sudden onset mild cricket ringing sound in his ear, lasting for few minutes, to the point that he called the past control, worry about the cricket in his apartment, but the reported cricket sound was not heard by other people, the recurrent episode happened about every 2 to 3 months, no loss of consciousness  ROS:   14 system review of systems performed and negative with exception of ***  PMH:  Past Medical History:  Diagnosis Date   Anemia    Asthma    Atrial fibrillation (HCC)    CHF (congestive heart failure) (HCC)    COPD (chronic obstructive pulmonary disease) (HCC)    Heart disease    Hypertension    Syncope     PSH:  Past Surgical History:  Procedure Laterality Date    CARDIAC CATHETERIZATION N/A 12/08/2015   Procedure: Left Heart Cath and Coronary Angiography;  Surgeon: Charolette Forward, MD;  Location: [facility];  Service: Cardiovascular;  Laterality: N/A;   ESOPHAGUS SURGERY     SHOULDER CLOSED REDUCTION Left 12/01/2017   Procedure: CLOSED REDUCTION SHOULDER;  Surgeon: Altamese Mount Morris, MD;  Location: [facility];  Service: Orthopedics;  Laterality: Left;    Social History:  Social History   Socioeconomic History   Marital status: Single    Spouse name: Not on file   Number of children: 1   Years of education: college   Highest education level: Not on file  Occupational History   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    Employer: [employer]  Tobacco Use   Smoking status: Never   Smokeless tobacco: Never  Vaping Use   Vaping Use: Never used  Substance and Sexual Activity   Alcohol use: No    Alcohol/week: 0.0 standard drinks of alcohol   Drug use: No   Sexual activity: Not Currently  Other Topics Concern   Not on file  Social History Narrative   Patient lives at home alone and he is single.   Patient is semi retired.    Education college.   Right handed.   Caffeine two cokes daily.   Social Determinants of Health   Financial Resource Strain: Low Risk  (08/27/2021)   Overall Financial Resource Strain (CARDIA)    Difficulty of Paying Living Expenses: Not hard at all  Food Insecurity: No Food Insecurity (08/27/2021)   Hunger Vital Sign    Worried About Running Out of Food in the Last Year: Never true    Ran Out of Food in the Last Year: Never true  Transportation Needs: No Transportation Needs (08/27/2021)   PRAPARE - Hydrologist (Medical): No    Lack of Transportation (Non-Medical): No  Physical Activity: Insufficiently Active (08/27/2021)   Exercise Vital Sign    Days of Exercise per Week: 7 days    Minutes of Exercise per Session: 10 min  Stress: No Stress Concern Present (08/27/2021)   [place]    Feeling of Stress : Not at all  Social Connections: Moderately Isolated (08/27/2021)   Social Connection and Isolation Panel [NHANES]    Frequency of Communication with Friends and Family: More than three times a week    Frequency of Social Gatherings with Friends and Family: More than three times a week    Attends Religious Services: More than 4 times per year    Active Member of [employer] or Organizations: No    Attends Archivist Meetings: Never    Marital Status: Never married  Intimate Partner Violence: Not At Risk (08/27/2021)   Humiliation, Afraid, Rape, and Kick questionnaire    Fear of Current or Ex-Partner: No    Emotionally Abused: No    Physically Abused: No    Sexually Abused: No    Family History:  Family History  Problem Relation Age of Onset   Cancer Mother     Medications:   Current Outpatient Medications on File Prior to Visit  Medication Sig Dispense Refill   acetaminophen (TYLENOL) 325 MG tablet Take 2 tablets (650 mg total) by mouth every 6 (six) hours as needed for mild pain (or Fever >/= 101). 30 tablet 0   albuterol (PROVENTIL HFA;VENTOLIN HFA) 108 (90 Base) MCG/ACT inhaler Inhale 2 puffs into the lungs every 6 (six) hours as needed. 1 Inhaler 5   albuterol (PROVENTIL) (2.5 MG/3ML) 0.083% nebulizer solution Take 3 mLs (2.5 mg total) by nebulization every 6 (six) hours as needed for wheezing or shortness of breath. 150 mL 1   allopurinol (ZYLOPRIM) 300 MG tablet Take 1 tablet (300 mg total) by mouth daily. 90 tablet 1   amiodarone (PACERONE) 200 MG tablet Take 1 tablet (200 mg total) by mouth 2 (two) times daily. 60 tablet 2   ARNUITY ELLIPTA 100 MCG/ACT AEPB Inhale 1 puff into the lungs daily. 30 each 3   aspirin EC 81 MG tablet Take 81 mg by mouth daily.     atorvastatin (LIPITOR) 40 MG tablet Take 1 tablet (40 mg total) by mouth daily. 90 tablet 1   clomiPHENE (CLOMID) 50 MG tablet 1/4 tab daily      clotrimazole-betamethasone (LOTRISONE) cream Apply 1 application topically 2 (two) times daily as needed (FOR RASH).     diclofenac Sodium (VOLTAREN) 1 % GEL Apply 4 g topically 4 (four) times daily. 100 g 1   digoxin (LANOXIN) 0.125 MG tablet Take 1 tablet (0.125 mg total) by mouth daily. 30 tablet 3   empagliflozin (JARDIANCE) 10 MG TABS tablet Take 1 tablet (10 mg total) by mouth daily before breakfast. 90 tablet 1   ferrous sulfate 325 (65 FE) MG tablet Take 325 mg by mouth daily with breakfast. Reported on 03/16/2016     furosemide (LASIX) 20 MG tablet TAKE 1 TABLET BY MOUTH EVERY DAY 90 tablet 1   lamoTRIgine (LAMICTAL) 100 MG tablet Take 1 tablet (100 mg total) by mouth 2 (two) times daily. 60 tablet 11   lamoTRIgine (LAMICTAL) 25 MG tablet 1 tab bid x one week 2 tab bid x 2nd week 3 tab bid x 3rd week 84 tablet 0   losartan (COZAAR) 100 MG tablet Take 100 mg by mouth daily.  3   methocarbamol (ROBAXIN) 500 MG tablet TAKE 1 TABLET (500 MG TOTAL) BY MOUTH EVERY 8 (EIGHT) HOURS AS NEEDED FOR MUSCLE SPASM/PAIN 90 tablet 0   metoprolol succinate (TOPROL-XL) 25 MG 24 hr tablet Take 1 tablet (25 mg total) by mouth daily. 90 tablet 1   nitroGLYCERIN (NITROSTAT) 0.4 MG SL tablet Place 1 tablet (0.4 mg total) under the tongue every 5 (five) minutes as needed for chest pain. 25 tablet 1   pantoprazole (PROTONIX) 40 MG tablet Take 1 tablet (40 mg total) by mouth daily. 90 tablet 1   potassium chloride (K-DUR) 10 MEQ tablet Take 10 mEq by mouth daily.     sildenafil (VIAGRA) 100 MG tablet Take 1 tablet (100 mg total) by mouth daily as needed for erectile dysfunction. 10 tablet 11   tacrolimus (PROTOPIC) 0.1 % ointment      tamoxifen (NOLVADEX) 10 MG tablet Take 1 tablet (10 mg total) by mouth 2 (two) times daily. 180 tablet 3   tamsulosin (FLOMAX) 0.4 MG CAPS capsule Take 1 capsule (0.4 mg total) by mouth daily. 90 capsule 1  No current facility-administered medications on file prior to visit.     Allergies:  No Known Allergies    OBJECTIVE:  Physical Exam  There were no vitals filed for this visit. There is no height or weight on file to calculate BMI. No results found.   General: well developed, well nourished, seated, in no evident distress Head: head normocephalic and atraumatic.   Neck: supple with no carotid or supraclavicular bruits Cardiovascular: regular rate and rhythm, no murmurs Musculoskeletal: no deformity Skin:  no rash/petichiae Vascular:  Normal pulses all extremities   Neurologic Exam Mental Status: Awake and fully alert. Oriented to place and time. Recent and remote memory intact. Attention span, concentration and fund of knowledge appropriate. Mood and affect appropriate.  Cranial Nerves: Pupils equal, briskly reactive to light. Extraocular movements full without nystagmus. Visual fields full to confrontation. Hearing intact. Facial sensation intact. Face, tongue, palate moves normally and symmetrically.  Motor: Normal bulk and tone. Normal strength in all tested extremity muscles Sensory.: intact to touch , pinprick , position and vibratory sensation.  Coordination: Rapid alternating movements normal in all extremities. Finger-to-nose and heel-to-shin performed accurately bilaterally. Gait and Station: Arises from chair without difficulty. Stance is normal. Gait demonstrates normal stride length and balance without use of AD. Tandem walk and heel toe without difficulty.  Reflexes: 1+ and symmetric. Toes downgoing.         ASSESSMENT/PLAN: [name] [name] is a 71 y.o. year old male   Recurrent episode of sudden passing out,             No additional events  Continue lamotrigine 100 mg twice daily  There was also family history of epilepsy, different recurrent episodes such as uncontrollable right arm jerking, sudden onset loud cricket sound in his ear, lasting for few minutes, no loss of consciousness,              Laboratory evaluation  suggestive of iron deficiency anemia (chronic), no significant abnormalities             MRI brain 03/2022 no acute findings             EEG 02/2022 unremarkable                   Follow up in *** or call earlier if needed   CC:  PCP: [name] Rakes, MD    I spent *** minutes of face-to-face and non-face-to-face time with patient.  This included previsit chart review, lab review, study review, order entry, electronic health record documentation, patient education regarding ***   Frann Rider, AGNP-BC  [facility] [address] [place], [place] [address]  Phone [phone] Fax [phone] Note: This document was prepared with digital dictation and possible smart phrase technology. Any transcriptional errors that result from this process are unintentional.

## 2022-07-11 ENCOUNTER — Ambulatory Visit (INDEPENDENT_AMBULATORY_CARE_PROVIDER_SITE_OTHER): Payer: Medicare HMO | Admitting: Adult Health

## 2022-07-11 ENCOUNTER — Encounter: Payer: Self-pay | Admitting: Adult Health

## 2022-07-11 VITALS — BP 134/90 | HR 63 | Ht 67.0 in | Wt 237.2 lb

## 2022-07-11 DIAGNOSIS — R404 Transient alteration of awareness: Secondary | ICD-10-CM

## 2022-07-11 DIAGNOSIS — H9311 Tinnitus, right ear: Secondary | ICD-10-CM

## 2022-07-11 NOTE — Patient Instructions (Addendum)
Referral placed to ENT for further evaluation of ear ringing concerns   Continue lamotrigine '100mg'$  twice daily for seizure prevention - if your evaluation with ENT looks good without any concerning findings, we may need to consider increasing lamotrigine dosage   Please track times that these episodes occur so we can better track them    Follow up in 4 months or call earlier if needed      Thank you for coming to see [place] at [facility]. I hope we have been able to provide you high quality care today.  You may receive a patient satisfaction survey over the next few weeks. We would appreciate your feedback and comments so that we may continue to improve ourselves and the health of our patients.

## 2022-07-13 ENCOUNTER — Telehealth: Payer: Self-pay | Admitting: Adult Health

## 2022-07-13 NOTE — Telephone Encounter (Signed)
Referral sent to [facility] [phone]

## 2022-07-25 ENCOUNTER — Ambulatory Visit: Payer: Medicare HMO | Attending: Family Medicine | Admitting: Family Medicine

## 2022-07-25 ENCOUNTER — Encounter: Payer: Self-pay | Admitting: Family Medicine

## 2022-07-25 VITALS — BP 136/79 | HR 69 | Temp 98.5°F | Ht 67.0 in | Wt 231.4 lb

## 2022-07-25 DIAGNOSIS — K219 Gastro-esophageal reflux disease without esophagitis: Secondary | ICD-10-CM | POA: Diagnosis not present

## 2022-07-25 DIAGNOSIS — J438 Other emphysema: Secondary | ICD-10-CM | POA: Diagnosis not present

## 2022-07-25 DIAGNOSIS — R7303 Prediabetes: Secondary | ICD-10-CM | POA: Diagnosis not present

## 2022-07-25 DIAGNOSIS — R399 Unspecified symptoms and signs involving the genitourinary system: Secondary | ICD-10-CM

## 2022-07-25 DIAGNOSIS — I5032 Chronic diastolic (congestive) heart failure: Secondary | ICD-10-CM

## 2022-07-25 DIAGNOSIS — M1A0710 Idiopathic chronic gout, right ankle and foot, without tophus (tophi): Secondary | ICD-10-CM | POA: Diagnosis not present

## 2022-07-25 DIAGNOSIS — I10 Essential (primary) hypertension: Secondary | ICD-10-CM

## 2022-07-25 DIAGNOSIS — I110 Hypertensive heart disease with heart failure: Secondary | ICD-10-CM | POA: Diagnosis not present

## 2022-07-25 DIAGNOSIS — Z1211 Encounter for screening for malignant neoplasm of colon: Secondary | ICD-10-CM

## 2022-07-25 LAB — POCT GLYCOSYLATED HEMOGLOBIN (HGB A1C): Hemoglobin A1C: 5.6 % (ref 4.0–5.6)

## 2022-07-25 MED ORDER — METOPROLOL SUCCINATE ER 25 MG PO TB24: 25.0000 mg | ORAL_TABLET | Freq: Every day | ORAL | 1 refills | Status: DC

## 2022-07-25 MED ORDER — TAMSULOSIN HCL 0.4 MG PO CAPS: 0.4000 mg | ORAL_CAPSULE | Freq: Every day | ORAL | 1 refills | Status: DC

## 2022-07-25 MED ORDER — ALLOPURINOL 300 MG PO TABS: 300.0000 mg | ORAL_TABLET | Freq: Every day | ORAL | 1 refills | Status: DC

## 2022-07-25 MED ORDER — PANTOPRAZOLE SODIUM 40 MG PO TBEC: 40.0000 mg | DELAYED_RELEASE_TABLET | Freq: Every day | ORAL | 1 refills | Status: DC

## 2022-07-25 MED ORDER — ATORVASTATIN CALCIUM 40 MG PO TABS: 40.0000 mg | ORAL_TABLET | Freq: Every day | ORAL | 1 refills | Status: DC

## 2022-07-25 MED ORDER — EMPAGLIFLOZIN 10 MG PO TABS: 10.0000 mg | ORAL_TABLET | Freq: Every day | ORAL | 1 refills | Status: DC

## 2022-07-25 NOTE — Progress Notes (Signed)
Subjective:  Patient ID: [name] [name], male    DOB: 11-15-1951  Age: 71 y.o. MRN: [id number]  CC: Hypertension   HPI [name] is a 71 y.o. year old male with a history of paroxysmal A. fib, atrial flutter, diastolic congestive heart failure (EF 55-60% in 11/2017), CAD (status post cardiac cath in 11/2015 - mid LAD 20% stenosis, dist LAD 40% stenosis, Ost RCA to prox RCA 30% stenosis, normal LV systolic function), sick sinus syndrome, COPD, GERD, seizures (previously managed by [facility]), BPH, hypogonadism (managed by endocrine),Gout here for a follow up visit.  Interval History: He last saw neurology 2 weeks ago due to recurrent episode of syncope and is currently on 100 mg of Lamictal twice daily after imaging and MRI of brain unremarkable. He denies any episodes of 'blackouts'. He has 'ringing in his ears' and has noticed he is drowsy when that occurs. ENT referral was placed and he awaits an appointment.  Seen by his cardiologist Dr. Terrence Dupont 2 months ago He does not get as dyspneic when he walks to the mailbox as he did previously. At his last visit I had initiated Jardiance which he is tolerating well.  He has lost 10 lbs in the last 3 months and states he has a reduced appetite. He thinks he was previously depressed and eating a lot which resulted in weight gain. States he needs to take his PPI as without it  Past Medical History:  Diagnosis Date   Anemia    Asthma    Atrial fibrillation (HCC)    CHF (congestive heart failure) (HCC)    COPD (chronic obstructive pulmonary disease) ([place])    Heart disease    Hypertension    Syncope     Past Surgical History:  Procedure Laterality Date   CARDIAC CATHETERIZATION N/A 12/08/2015   Procedure: Left Heart Cath and Coronary Angiography;  Surgeon: Charolette Forward, MD;  Location: [facility];  Service: Cardiovascular;  Laterality: N/A;   ESOPHAGUS SURGERY     SHOULDER CLOSED REDUCTION Left 12/01/2017   Procedure: CLOSED  REDUCTION SHOULDER;  Surgeon: Altamese Morland, MD;  Location: [facility];  Service: Orthopedics;  Laterality: Left;    Family History  Problem Relation Age of Onset   Cancer Mother     Social History   Socioeconomic History   Marital status: Single    Spouse name: Not on file   Number of children: 1   Years of education: college   Highest education level: Not on file  Occupational History   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    Employer: [employer]  Tobacco Use   Smoking status: Never   Smokeless tobacco: Never  Vaping Use   Vaping Use: Never used  Substance and Sexual Activity   Alcohol use: No    Alcohol/week: 0.0 standard drinks of alcohol   Drug use: No   Sexual activity: Not Currently  Other Topics Concern   Not on file  Social History Narrative   Patient lives at home alone and he is single.   Patient is semi retired.    Education college.   Right handed.   Caffeine two cokes daily.   Social Determinants of Health   Financial Resource Strain: Low Risk  (08/27/2021)   Overall Financial Resource Strain (CARDIA)    Difficulty of Paying Living Expenses: Not hard at all  Food Insecurity: No Food Insecurity (08/27/2021)   Hunger Vital Sign    Worried About Running Out of Food in  the Last Year: Never true    [place] in the Last Year: Never true  Transportation Needs: No Transportation Needs (08/27/2021)   PRAPARE - Hydrologist (Medical): No    Lack of Transportation (Non-Medical): No  Physical Activity: Insufficiently Active (08/27/2021)   Exercise Vital Sign    Days of Exercise per Week: 7 days    Minutes of Exercise per Session: 10 min  Stress: No Stress Concern Present (08/27/2021)   [place]    Feeling of Stress : Not at all  Social Connections: Moderately Isolated (08/27/2021)   Social Connection and Isolation Panel [NHANES]    Frequency of Communication with Friends and  Family: More than three times a week    Frequency of Social Gatherings with Friends and Family: More than three times a week    Attends Religious Services: More than 4 times per year    Active Member of [employer] or Organizations: No    Attends Archivist Meetings: Never    Marital Status: Never married    No Known Allergies  Outpatient Medications Prior to Visit  Medication Sig Dispense Refill   acetaminophen (TYLENOL) 325 MG tablet Take 2 tablets (650 mg total) by mouth every 6 (six) hours as needed for mild pain (or Fever >/= 101). 30 tablet 0   albuterol (PROVENTIL HFA;VENTOLIN HFA) 108 (90 Base) MCG/ACT inhaler Inhale 2 puffs into the lungs every 6 (six) hours as needed. 1 Inhaler 5   albuterol (PROVENTIL) (2.5 MG/3ML) 0.083% nebulizer solution Take 3 mLs (2.5 mg total) by nebulization every 6 (six) hours as needed for wheezing or shortness of breath. 150 mL 1   amiodarone (PACERONE) 200 MG tablet Take 1 tablet (200 mg total) by mouth 2 (two) times daily. 60 tablet 2   ARNUITY ELLIPTA 100 MCG/ACT AEPB Inhale 1 puff into the lungs daily. 30 each 3   aspirin EC 81 MG tablet Take 81 mg by mouth daily.     clomiPHENE (CLOMID) 50 MG tablet 1/4 tab daily     clotrimazole-betamethasone (LOTRISONE) cream Apply 1 application topically 2 (two) times daily as needed (FOR RASH).     diclofenac Sodium (VOLTAREN) 1 % GEL Apply 4 g topically 4 (four) times daily. 100 g 1   digoxin (LANOXIN) 0.125 MG tablet Take 1 tablet (0.125 mg total) by mouth daily. 30 tablet 3   ferrous sulfate 325 (65 FE) MG tablet Take 325 mg by mouth daily with breakfast. Reported on 03/16/2016     furosemide (LASIX) 20 MG tablet TAKE 1 TABLET BY MOUTH EVERY DAY 90 tablet 1   lamoTRIgine (LAMICTAL) 100 MG tablet Take 1 tablet (100 mg total) by mouth 2 (two) times daily. 60 tablet 11   losartan (COZAAR) 100 MG tablet Take 100 mg by mouth daily.  3   methocarbamol (ROBAXIN) 500 MG tablet TAKE 1 TABLET (500 MG TOTAL) BY  MOUTH EVERY 8 (EIGHT) HOURS AS NEEDED FOR MUSCLE SPASM/PAIN 90 tablet 0   nitroGLYCERIN (NITROSTAT) 0.4 MG SL tablet Place 1 tablet (0.4 mg total) under the tongue every 5 (five) minutes as needed for chest pain. 25 tablet 1   potassium chloride (K-DUR) 10 MEQ tablet Take 10 mEq by mouth daily.     sildenafil (VIAGRA) 100 MG tablet Take 1 tablet (100 mg total) by mouth daily as needed for erectile dysfunction. 10 tablet 11   tacrolimus (PROTOPIC) 0.1 % ointment  tamoxifen (NOLVADEX) 10 MG tablet Take 1 tablet (10 mg total) by mouth 2 (two) times daily. 180 tablet 3   allopurinol (ZYLOPRIM) 300 MG tablet Take 1 tablet (300 mg total) by mouth daily. 90 tablet 1   atorvastatin (LIPITOR) 40 MG tablet Take 1 tablet (40 mg total) by mouth daily. 90 tablet 1   empagliflozin (JARDIANCE) 10 MG TABS tablet Take 1 tablet (10 mg total) by mouth daily before breakfast. 90 tablet 1   metoprolol succinate (TOPROL-XL) 25 MG 24 hr tablet Take 1 tablet (25 mg total) by mouth daily. 90 tablet 1   pantoprazole (PROTONIX) 40 MG tablet Take 1 tablet (40 mg total) by mouth daily. 90 tablet 1   tamsulosin (FLOMAX) 0.4 MG CAPS capsule Take 1 capsule (0.4 mg total) by mouth daily. 90 capsule 1   No facility-administered medications prior to visit.     ROS Review of Systems  Constitutional:  Negative for activity change and appetite change.  HENT:  Negative for sinus pressure and sore throat.   Respiratory:  Negative for chest tightness, shortness of breath and wheezing.   Cardiovascular:  Negative for chest pain and palpitations.  Gastrointestinal:  Negative for abdominal distention, abdominal pain and constipation.  Genitourinary: Negative.   Musculoskeletal: Negative.   Psychiatric/Behavioral:  Negative for behavioral problems and dysphoric mood.     Objective:  BP 136/79   Pulse 69   Temp 98.5 F (36.9 C) (Oral)   Ht _0  (1.702 m)   Wt 231 lb 6.4 oz (105 kg)   SpO2 98%   BMI 36.24 kg/m       07/25/2022    2:16 PM 07/11/2022   10:35 AM 04/11/2022    2:27 PM  BP/Weight  Systolic BP 161 096 045  Diastolic BP 79 90 76  Wt. (Lbs) 231.4 237.2 241.4  BMI 36.24 kg/m2 37.15 kg/m2 37.81 kg/m2      Physical Exam Constitutional:      Appearance: He is well-developed.  Cardiovascular:     Rate and Rhythm: Normal rate.     Heart sounds: Normal heart sounds. No murmur heard. Pulmonary:     Effort: Pulmonary effort is normal.     Breath sounds: Normal breath sounds. No wheezing or rales.  Chest:     Chest wall: No tenderness.  Abdominal:     General: Bowel sounds are normal. There is no distension.     Palpations: Abdomen is soft. There is no mass.     Tenderness: There is no abdominal tenderness.  Musculoskeletal:        General: Normal range of motion.     Right lower leg: No edema.     Left lower leg: No edema.  Neurological:     Mental Status: He is alert and oriented to person, place, and time.  Psychiatric:        Mood and Affect: Mood normal.        Latest Ref Rng & Units 03/08/2022   12:15 PM 10/07/2021   10:17 AM 07/18/2021   10:06 AM  CMP  Glucose 70 - 99 mg/dL 88  82  83   BUN 8 - 27 mg/dL _1 Creatinine 0.76 - 1.27 mg/dL 1.12  1.27  1.38   Sodium 134 - 144 mmol/L 140  139  143   Potassium 3.5 - 5.2 mmol/L 4.0  4.2  4.1   Chloride 96 - 106 mmol/L 102  101  103  CO2 20 - 29 mmol/L _0 Calcium 8.6 - 10.2 mg/dL 9.0  9.2  9.5   Total Protein 6.0 - 8.5 g/dL 7.6  7.6  7.7   Total Bilirubin 0.0 - 1.2 mg/dL 0.4  0.3  0.5   Alkaline Phos 44 - 121 IU/L 102  103  99   AST 0 - 40 IU/L _1 ALT 0 - 44 IU/L _2 Lipid Panel     Component Value Date/Time   CHOL 146 10/07/2021 1017   TRIG 85 10/07/2021 1017   HDL 45 10/07/2021 1017   CHOLHDL 3.2 10/07/2021 1017   CHOLHDL 2.5 12/09/2015 0133   VLDL 11 12/09/2015 0133   LDLCALC 85 10/07/2021 1017    CBC    Component Value Date/Time   WBC 7.8 03/08/2022 1215   WBC 8.3  01/20/2019 0345   RBC 5.33 03/08/2022 1215   RBC 5.22 01/20/2019 0345   HGB 11.5 (L) 03/08/2022 1215   HCT 37.6 03/08/2022 1215   PLT 357 03/08/2022 1215   MCV 71 (L) 03/08/2022 1215   MCH 21.6 (L) 03/08/2022 1215   MCH 21.1 (L) 01/20/2019 0345   MCHC 30.6 (L) 03/08/2022 1215   MCHC 29.5 (L) 01/20/2019 0345   RDW 16.0 (H) 03/08/2022 1215   LYMPHSABS 2.4 03/08/2022 1215   MONOABS 1.2 (H) 12/01/2017 2242   EOSABS 0.6 (H) 03/08/2022 1215   BASOSABS 0.1 03/08/2022 1215    Lab Results  Component Value Date   HGBA1C 5.6 07/25/2022    Assessment & Plan:  1. Prediabetes Labs reveal prediabetes with an A1c of 5.6 improved from 5.8.  Working on a low carbohydrate diet, exercise, weight loss is recommended in order to prevent progression to type 2 diabetes mellitus.  - POCT glycosylated hemoglobin (Hb A1C)  2. Idiopathic chronic gout of right ankle without tophus Stable with no flares - allopurinol (ZYLOPRIM) 300 MG tablet; Take 1 tablet (300 mg total) by mouth daily.  Dispense: 90 tablet; Refill: 1 - CMP14+EGFR  3. Hypertensive heart disease with chronic diastolic congestive heart failure (HCC) EF 55-60% from 2018 Euvolemic Dyspnea has improved with SGLT2I - atorvastatin (LIPITOR) 40 MG tablet; Take 1 tablet (40 mg total) by mouth daily.  Dispense: 90 tablet; Refill: 1 - empagliflozin (JARDIANCE) 10 MG TABS tablet; Take 1 tablet (10 mg total) by mouth daily before breakfast.  Dispense: 90 tablet; Refill: 1  4. Essential hypertension, benign Controlled Counseled on blood pressure goal of less than 130/80, low-sodium, DASH diet, medication compliance, 150 minutes of moderate intensity exercise per week. Discussed medication compliance, adverse effects.  - metoprolol succinate (TOPROL-XL) 25 MG 24 hr tablet; Take 1 tablet (25 mg total) by mouth daily.  Dispense: 90 tablet; Refill: 1  5. Lower urinary tract symptoms Stable - tamsulosin (FLOMAX) 0.4 MG CAPS capsule; Take 1 capsule  (0.4 mg total) by mouth daily.  Dispense: 90 capsule; Refill: 1  6. Gastroesophageal reflux disease without esophagitis Stable - pantoprazole (PROTONIX) 40 MG tablet; Take 1 tablet (40 mg total) by mouth daily.  Dispense: 90 tablet; Refill: 1  7. Screening for colon cancer  - Cologuard  8. Other emphysema ([place]) Previously ordered to evaluate dyspnea especially in the setting of Amiodarone use Will contact PFT lab so they can schedule - Pulmonary function test    Meds ordered this encounter  Medications   allopurinol (ZYLOPRIM) 300 MG tablet  Sig: Take 1 tablet (300 mg total) by mouth daily.    Dispense:  90 tablet    Refill:  1   atorvastatin (LIPITOR) 40 MG tablet    Sig: Take 1 tablet (40 mg total) by mouth daily.    Dispense:  90 tablet    Refill:  1   metoprolol succinate (TOPROL-XL) 25 MG 24 hr tablet    Sig: Take 1 tablet (25 mg total) by mouth daily.    Dispense:  90 tablet    Refill:  1   empagliflozin (JARDIANCE) 10 MG TABS tablet    Sig: Take 1 tablet (10 mg total) by mouth daily before breakfast.    Dispense:  90 tablet    Refill:  1   tamsulosin (FLOMAX) 0.4 MG CAPS capsule    Sig: Take 1 capsule (0.4 mg total) by mouth daily.    Dispense:  90 capsule    Refill:  1   pantoprazole (PROTONIX) 40 MG tablet    Sig: Take 1 tablet (40 mg total) by mouth daily.    Dispense:  90 tablet    Refill:  1    Follow-up: Return in about 6 months (around 01/25/2023) for Chronic medical conditions.       Charlott Rakes, MD, FAAFP. [facility] [place], [place]   07/25/2022, 7:56 PM

## 2022-07-25 NOTE — Patient Instructions (Signed)
Exercising to Stay Healthy To become healthy and stay healthy, it is recommended that you do moderate-intensity and vigorous-intensity exercise. You can tell that you are exercising at a moderate intensity if your heart starts beating faster and you start breathing faster but can still hold a conversation. You can tell that you are exercising at a vigorous intensity if you are breathing much harder and faster and cannot hold a conversation while exercising. How can exercise benefit me? Exercising regularly is important. It has many health benefits, such as: Improving overall fitness, flexibility, and endurance. Increasing bone density. Helping with weight control. Decreasing body fat. Increasing muscle strength and endurance. Reducing stress and tension, anxiety, depression, or anger. Improving overall health. What guidelines should I follow while exercising? Before you start a new exercise program, talk with your health care provider. Do not exercise so much that you hurt yourself, feel dizzy, or get very short of breath. Wear comfortable clothes and wear shoes with good support. Drink plenty of water while you exercise to prevent dehydration or heat stroke. Work out until your breathing and your heartbeat get faster (moderate intensity). How often should I exercise? Choose an activity that you enjoy, and set realistic goals. Your health care provider can help you make an activity plan that is individually designed and works best for you. Exercise regularly as told by your health care provider. This may include: Doing strength training two times a week, such as: Lifting weights. Using resistance bands. Push-ups. Sit-ups. Yoga. Doing a certain intensity of exercise for a given amount of time. Choose from these options: A total of 150 minutes of moderate-intensity exercise every week. A total of 75 minutes of vigorous-intensity exercise every week. A mix of moderate-intensity and  vigorous-intensity exercise every week. Children, pregnant women, people who have not exercised regularly, people who are overweight, and older adults may need to talk with a health care provider about what activities are safe to perform. If you have a medical condition, be sure to talk with your health care provider before you start a new exercise program. What are some exercise ideas? Moderate-intensity exercise ideas include: Walking 1 mile (1.6 km) in about 15 minutes. Biking. Hiking. Golfing. Dancing. Water aerobics. Vigorous-intensity exercise ideas include: Walking 4.5 miles (7.2 km) or more in about 1 hour. Jogging or running 5 miles (8 km) in about 1 hour. Biking 10 miles (16.1 km) or more in about 1 hour. Lap swimming. Roller-skating or in-line skating. Cross-country skiing. Vigorous competitive sports, such as football, basketball, and soccer. Jumping rope. Aerobic dancing. What are some everyday activities that can help me get exercise? Yard work, such as: Pushing a lawn mower. Raking and bagging leaves. Washing your car. Pushing a stroller. Shoveling snow. Gardening. Washing windows or floors. How can I be more active in my day-to-day activities? Use stairs instead of an elevator. Take a walk during your lunch break. If you drive, park your car farther away from your work or school. If you take public transportation, get off one stop early and walk the rest of the way. Stand up or walk around during all of your indoor phone calls. Get up, stretch, and walk around every 30 minutes throughout the day. Enjoy exercise with a friend. Support to continue exercising will help you keep a regular routine of activity. Where to find more information You can find more information about exercising to stay healthy from: U.S. Department of Health and Human Services: www.hhs.gov Centers for Disease Control and Prevention (  CDC): www.cdc.gov Summary Exercising regularly is  important. It will improve your overall fitness, flexibility, and endurance. Regular exercise will also improve your overall health. It can help you control your weight, reduce stress, and improve your bone density. Do not exercise so much that you hurt yourself, feel dizzy, or get very short of breath. Before you start a new exercise program, talk with your health care provider. This information is not intended to replace advice given to you by your health care provider. Make sure you discuss any questions you have with your health care provider. Document Revised: 04/08/2021 Document Reviewed: 04/08/2021 Elsevier Patient Education  2023 Elsevier Inc.  

## 2022-07-25 NOTE — Progress Notes (Signed)
Pt states he has constant ringing in right ear. Pt is also experiences light headedness when ringing occurs.

## 2022-07-26 LAB — CMP14+EGFR
ALT: 10 IU/L (ref 0–44)
AST: 20 IU/L (ref 0–40)
Albumin/Globulin Ratio: 0.9 — ABNORMAL LOW (ref 1.2–2.2)
Albumin: 3.7 g/dL — ABNORMAL LOW (ref 3.8–4.8)
Alkaline Phosphatase: 93 IU/L (ref 44–121)
BUN/Creatinine Ratio: 11 (ref 10–24)
BUN: 14 mg/dL (ref 8–27)
Bilirubin Total: <0.2 mg/dL (ref 0.0–1.2)
CO2: 27 mmol/L (ref 20–29)
Calcium: 9.5 mg/dL (ref 8.6–10.2)
Chloride: 103 mmol/L (ref 96–106)
Creatinine, Ser: 1.28 mg/dL — ABNORMAL HIGH (ref 0.76–1.27)
Globulin, Total: 3.9 g/dL (ref 1.5–4.5)
Glucose: 76 mg/dL (ref 70–99)
Potassium: 3.8 mmol/L (ref 3.5–5.2)
Sodium: 142 mmol/L (ref 134–144)
Total Protein: 7.6 g/dL (ref 6.0–8.5)
eGFR: 59 mL/min/{1.73_m2} — ABNORMAL LOW (ref >59)

## 2022-08-01 ENCOUNTER — Inpatient Hospital Stay (HOSPITAL_COMMUNITY): Admission: RE | Admit: 2022-08-01 | Payer: Medicare HMO | Source: Ambulatory Visit

## 2022-08-09 ENCOUNTER — Ambulatory Visit: Payer: Medicare HMO | Admitting: Podiatry

## 2022-08-17 DIAGNOSIS — L304 Erythema intertrigo: Secondary | ICD-10-CM | POA: Diagnosis not present

## 2022-09-04 ENCOUNTER — Ambulatory Visit: Payer: Medicare HMO | Admitting: Podiatry

## 2022-09-06 ENCOUNTER — Ambulatory Visit (INDEPENDENT_AMBULATORY_CARE_PROVIDER_SITE_OTHER): Payer: Medicare HMO | Admitting: Podiatry

## 2022-09-06 ENCOUNTER — Encounter: Payer: Self-pay | Admitting: Podiatry

## 2022-09-06 DIAGNOSIS — B351 Tinea unguium: Secondary | ICD-10-CM | POA: Diagnosis not present

## 2022-09-06 DIAGNOSIS — M79674 Pain in right toe(s): Secondary | ICD-10-CM | POA: Diagnosis not present

## 2022-09-06 DIAGNOSIS — M79675 Pain in left toe(s): Secondary | ICD-10-CM | POA: Diagnosis not present

## 2022-09-06 NOTE — Progress Notes (Signed)
This patient presents to the office with chief complaint of long thick painful nails.  Patient says the nails are painful walking and wearing shoes.  This patient is unable to self treat.  This patient is unable to trim his nails since she is unable to reach his nails.  She presents to the office for preventative foot care services.  General Appearance  Alert, conversant and in no acute stress.  Vascular  Dorsalis pedis and posterior tibial  pulses are palpable  bilaterally.  Capillary return is within normal limits  bilaterally. Temperature is within normal limits  bilaterally.  Neurologic  Senn-Weinstein monofilament wire test within normal limits  bilaterally. Muscle power within normal limits bilaterally.  Nails Thick disfigured discolored nails with subungual debris  from hallux to fifth toes bilaterally. No evidence of bacterial infection or drainage bilaterally.  Orthopedic  No limitations of motion  feet .  No crepitus or effusions noted.  No bony pathology or digital deformities noted.  Skin  normotropic skin with no porokeratosis noted bilaterally.  No signs of infections or ulcers noted.     Onychomycosis  Nails  B/L.  Pain in right toes  Pain in left toes  Debridement of nails both feet followed trimming the nails with dremel tool.    RTC 3 months.   Yasuo Phimmasone DPM   

## 2022-10-04 DIAGNOSIS — I10 Essential (primary) hypertension: Secondary | ICD-10-CM | POA: Diagnosis not present

## 2022-10-04 DIAGNOSIS — E669 Obesity, unspecified: Secondary | ICD-10-CM | POA: Diagnosis not present

## 2022-10-04 DIAGNOSIS — E785 Hyperlipidemia, unspecified: Secondary | ICD-10-CM | POA: Diagnosis not present

## 2022-10-04 DIAGNOSIS — R7303 Prediabetes: Secondary | ICD-10-CM | POA: Diagnosis not present

## 2022-11-01 ENCOUNTER — Other Ambulatory Visit: Payer: Self-pay | Admitting: Family Medicine

## 2022-11-01 DIAGNOSIS — I5032 Chronic diastolic (congestive) heart failure: Secondary | ICD-10-CM

## 2022-11-15 NOTE — Progress Notes (Deleted)
[facility] [address] [place]. [place] [address] [phone]       OFFICE FOLLOW UP NOTE  [name] Date of Birth:  15-Dec-1951 Medical Record Number:  [id number]    Primary neurologist: Dr. Krista [name] Reason for visit: Altered consciousness possible partial seizure    SUBJECTIVE:   CHIEF COMPLAINT:  No chief complaint on file.   HPI:   Update 11/20/2022 JM: Patient returns for 73-monthfollow-up.  He denies any additional passing out episodes.  Continues on lamotrigine 100 mg twice daily, denies side effects.  Referral to ENT for tinnitus ***      History provided for reference purposes only Update 07/10/2022 JM: Patient returns for follow-up visit after prior initial visit 73 months ago with Dr. YKrista Bluefor passing out episode and initiated lamotrigine.  Extensive work-up including MRI brain, EEG and lab work largely unremarkable.  He has not had any additional syncopal events.  He does report intermittent high pitched right ear ringing, at times can be more pronounced and will typically be associated with fatigue, will lay down and nap with resolution of symptoms upon awakening. No loss of consciousness, altered mental status or any other associated seizure symptoms.  Tolerating lamotrigine 100 mg twice daily, denies side effects.  No further questions at this time.  MRI brain w wo contrast 04/07/2022 IMPRESSION: This MRI of the brain with and without contrast shows the following: 1.   Mild generalized cortical atrophy, just minimally increased compared to the 2015 MRI and likely normal for age. 2.   Some scattered T2/FLAIR hyperintense foci in the cerebral hemispheres in the pons consistent with mild chronic microvascular ischemic change, essentially unchanged compared to the 2015 MRI. 3.   No acute findings.  Normal enhancement pattern.  EEG 03/16/2022 CONCLUSION: This is a  normal awake EEG.  There is no electrodiagnostic evidence of epileptiform  discharge.   Consult visit 03/08/2022 Dr. YKrista [name] [name] SHABAZZis a 71year old male, seen in request by his primary care physician Dr. NCharlott Rakes MD For evaluation of passing out episode,   I reviewed and summarized the referring note.PMHX. Atrial fibrillation, taking amiodarone digoxin Hypertension Hyperlipidemia   He reported 1 episode in December 2022, he woke up from sleep using bathroom to urinate, he could remember he finish urination, then woke up on the back, apparently he fell to his right side into the adjacent bathtub, With the shower curtain all over him,   He is sleep with TV on all night, based on the program, he thought he was out for about 10 minutes  There was no self injury, he was able to get out of the bathtub, no tongue biting, no incontinence  He lives by himself in an apartment, need to his people regularly does community service.  He does wake up frequently during middle of the night, also complains of excessive daytime fatigue, sleepiness, taking frequent cat naps,   I saw him previously in 2018 for passing out spells  At that time he complains of intermittent episode of sudden onset right arm shaking, lasting for few minutes, no loss of consciousness, not generalized to left side, no right facial neck involvement,  He does not have a family history of epilepsy, brother and father had a diagnosis of seizure presented with similar event  At that time, extensive laboratory evaluation including MRI of the brain, EEG showed no significant abnormality. Laboratory evaluations showed no significant abnormality as well, I have suggested him empirically treated  with Keppra, he is hesitate to stop the medications, loss of follow-up    Over the past few years, he reported different spells, he often have sudden onset mild cricket ringing sound in his ear, lasting for few minutes, to the point that he called the past control, worry about the cricket in his apartment,  but the reported cricket sound was not heard by other people, the recurrent episode happened about every 2 to 3 months, no loss of consciousness       ROS:   14 system review of systems performed and negative with exception of those listed in HPI  PMH:  Past Medical History:  Diagnosis Date   Anemia    Asthma    Atrial fibrillation (HCC)    CHF (congestive heart failure) (HCC)    COPD (chronic obstructive pulmonary disease) ([place])    Heart disease    Hypertension    Syncope     PSH:  Past Surgical History:  Procedure Laterality Date   CARDIAC CATHETERIZATION N/A 12/08/2015   Procedure: Left Heart Cath and Coronary Angiography;  Surgeon: [name] Forward, MD;  Location: [facility];  Service: Cardiovascular;  Laterality: N/A;   ESOPHAGUS SURGERY     SHOULDER CLOSED REDUCTION Left 12/01/2017   Procedure: CLOSED REDUCTION SHOULDER;  Surgeon: [name] [place], MD;  Location: [facility];  Service: Orthopedics;  Laterality: Left;    Social History:  Social History   Socioeconomic History   Marital status: Single    Spouse name: Not on file   Number of children: 1   Years of education: college   Highest education level: Not on file  Occupational History   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    Employer: [name]  Tobacco Use   Smoking status: Never   Smokeless tobacco: Never  Vaping Use   Vaping Use: Never used  Substance and Sexual Activity   Alcohol use: No    Alcohol/week: 0.0 standard drinks of alcohol   Drug use: No   Sexual activity: Not Currently  Other Topics Concern   Not on file  Social History Narrative   Patient lives at home alone and he is single.   Patient is semi retired.    Education college.   Right handed.   Caffeine two cokes daily.   Social Determinants of Health   Financial Resource Strain: Low Risk  (08/27/2021)   Overall Financial Resource Strain (CARDIA)    Difficulty of Paying Living Expenses: Not hard at all  Food Insecurity: No Food  Insecurity (08/27/2021)   Hunger Vital Sign    Worried About Running Out of Food in the Last Year: Never true    Ran Out of Food in the Last Year: Never true  Transportation Needs: No Transportation Needs (08/27/2021)   PRAPARE - Hydrologist (Medical): No    Lack of Transportation (Non-Medical): No  Physical Activity: Insufficiently Active (08/27/2021)   Exercise Vital Sign    Days of Exercise per Week: 7 days    Minutes of Exercise per Session: 10 min  Stress: No Stress Concern Present (08/27/2021)   [place]    Feeling of Stress : Not at all  Social Connections: Moderately Isolated (08/27/2021)   Social Connection and Isolation Panel [NHANES]    Frequency of Communication with Friends and Family: More than three times a week    Frequency of Social Gatherings with Friends and Family: More than three  times a week    Attends Religious Services: More than 4 times per year    Active Member of Clubs or Organizations: No    Attends Archivist Meetings: Never    Marital Status: Never married  Intimate Partner Violence: Not At Risk (08/27/2021)   Humiliation, Afraid, Rape, and Kick questionnaire    Fear of Current or Ex-Partner: No    Emotionally Abused: No    Physically Abused: No    Sexually Abused: No    Family History:  Family History  Problem Relation Age of Onset   Cancer Mother     Medications:   Current Outpatient Medications on File Prior to Visit  Medication Sig Dispense Refill   acetaminophen (TYLENOL) 325 MG tablet Take 2 tablets (650 mg total) by mouth every 6 (six) hours as needed for mild pain (or Fever >/= 101). 30 tablet 0   albuterol (PROVENTIL HFA;VENTOLIN HFA) 108 (90 Base) MCG/ACT inhaler Inhale 2 puffs into the lungs every 6 (six) hours as needed. 1 Inhaler 5   albuterol (PROVENTIL) (2.5 MG/3ML) 0.083% nebulizer solution Take 3 mLs (2.5 mg total) by nebulization  every 6 (six) hours as needed for wheezing or shortness of breath. 150 mL 1   allopurinol (ZYLOPRIM) 300 MG tablet Take 1 tablet (300 mg total) by mouth daily. 90 tablet 1   amiodarone (PACERONE) 200 MG tablet Take 1 tablet (200 mg total) by mouth 2 (two) times daily. 60 tablet 2   ARNUITY ELLIPTA 100 MCG/ACT AEPB Inhale 1 puff into the lungs daily. 30 each 3   aspirin EC 81 MG tablet Take 81 mg by mouth daily.     atorvastatin (LIPITOR) 40 MG tablet Take 1 tablet (40 mg total) by mouth daily. 90 tablet 1   clomiPHENE (CLOMID) 50 MG tablet 1/4 tab daily     clotrimazole-betamethasone (LOTRISONE) cream Apply 1 application topically 2 (two) times daily as needed (FOR RASH).     diclofenac Sodium (VOLTAREN) 1 % GEL Apply 4 g topically 4 (four) times daily. 100 g 1   digoxin (LANOXIN) 0.125 MG tablet Take 1 tablet (0.125 mg total) by mouth daily. 30 tablet 3   empagliflozin (JARDIANCE) 10 MG TABS tablet Take 1 tablet (10 mg total) by mouth daily before breakfast. 90 tablet 1   ferrous sulfate 325 (65 FE) MG tablet Take 325 mg by mouth daily with breakfast. Reported on 03/16/2016     furosemide (LASIX) 20 MG tablet TAKE 1 TABLET BY MOUTH EVERY DAY 90 tablet 1   lamoTRIgine (LAMICTAL) 100 MG tablet Take 1 tablet (100 mg total) by mouth 2 (two) times daily. 60 tablet 11   losartan (COZAAR) 100 MG tablet Take 100 mg by mouth daily.  3   methocarbamol (ROBAXIN) 500 MG tablet TAKE 1 TABLET (500 MG TOTAL) BY MOUTH EVERY 8 (EIGHT) HOURS AS NEEDED FOR MUSCLE SPASM/PAIN 90 tablet 0   metoprolol succinate (TOPROL-XL) 25 MG 24 hr tablet Take 1 tablet (25 mg total) by mouth daily. 90 tablet 1   nitroGLYCERIN (NITROSTAT) 0.4 MG SL tablet Place 1 tablet (0.4 mg total) under the tongue every 5 (five) minutes as needed for chest pain. 25 tablet 1   pantoprazole (PROTONIX) 40 MG tablet Take 1 tablet (40 mg total) by mouth daily. 90 tablet 1   potassium chloride (K-DUR) 10 MEQ tablet Take 10 mEq by mouth daily.      sildenafil (VIAGRA) 100 MG tablet Take 1 tablet (100 mg total) by mouth  daily as needed for erectile dysfunction. 10 tablet 11   tacrolimus (PROTOPIC) 0.1 % ointment      tamoxifen (NOLVADEX) 10 MG tablet Take 1 tablet (10 mg total) by mouth 2 (two) times daily. 180 tablet 3   tamsulosin (FLOMAX) 0.4 MG CAPS capsule Take 1 capsule (0.4 mg total) by mouth daily. 90 capsule 1   No current facility-administered medications on file prior to visit.    Allergies:  No Known Allergies    OBJECTIVE:  Physical Exam  There were no vitals filed for this visit.  There is no height or weight on file to calculate BMI. No results found.   General: well developed, well nourished, very pleasant elderly African-American male, seated, in no evident distress Head: head normocephalic and atraumatic.   Neck: supple with no carotid or supraclavicular bruits Cardiovascular: regular rate and rhythm, no murmurs Musculoskeletal: no deformity Skin:  no rash/petichiae Vascular:  Normal pulses all extremities   Neurologic Exam Mental Status: Awake and fully alert. Oriented to place and time. Recent and remote memory intact. Attention span, concentration and fund of knowledge appropriate. Mood and affect appropriate.  Cranial Nerves: Pupils equal, briskly reactive to light. Extraocular movements full without nystagmus. Visual fields full to confrontation.  Decreased hearing in right ear by finger rubbing.  Facial sensation intact. Face, tongue, palate moves normally and symmetrically.  Motor: Normal bulk and tone. Normal strength in all tested extremity muscles Sensory.: intact to touch , pinprick , position and vibratory sensation.  Coordination: Rapid alternating movements normal in all extremities. Finger-to-nose and heel-to-shin performed accurately bilaterally. Gait and Station: Arises from chair without difficulty. Stance is normal. Gait demonstrates normal stride length and balance without use of AD.  Tandem walk and heel toe without difficulty.  Reflexes: 1+ and symmetric. Toes downgoing.         ASSESSMENT/PLAN: [name] [name] is a 71 y.o. year old male   Recurrent episode of sudden passing out,             No additional passing out episodes  Continue lamotrigine 100 mg twice daily There was also family history of epilepsy, different recurrent episodes such as uncontrollable right arm jerking, sudden onset loud cricket sound in his ear, lasting for few minutes, no loss of consciousness,             Laboratory evaluation suggestive of iron deficiency anemia (chronic), no significant abnormalities             MRI brain 03/2022 no acute findings             EEG 02/2022 unremarkable               Right ear intermittent tinnitus  Referral placed to ENT for further evaluation  If exam normal, by consider increasing lamotrigine dosage for possible partial type seizures as these episodes can be associated with increased fatigue      Follow up in 4 months or call earlier if needed   CC:  PCP: Charlott Rakes, MD    I spent 24 minutes of face-to-face and non-face-to-face time with patient.  This included previsit chart review, lab review, study review, order entry, electronic health record documentation, patient education regarding above diagnoses and treatment plan and answered all the questions to patient's satisfaction   Frann Rider, [facility]  [facility] [address] [place], [place] [address]  Phone [phone] Fax [phone] Note: This document was prepared with digital dictation and possible smart  Company secretary. Any transcriptional errors that result from this process are unintentional.

## 2022-11-20 ENCOUNTER — Ambulatory Visit: Payer: Medicare HMO | Admitting: Adult Health

## 2022-11-21 ENCOUNTER — Ambulatory Visit: Payer: Medicare HMO | Admitting: Adult Health

## 2022-11-30 DIAGNOSIS — H9311 Tinnitus, right ear: Secondary | ICD-10-CM | POA: Diagnosis not present

## 2022-11-30 DIAGNOSIS — H903 Sensorineural hearing loss, bilateral: Secondary | ICD-10-CM | POA: Diagnosis not present

## 2022-11-30 DIAGNOSIS — H9041 Sensorineural hearing loss, unilateral, right ear, with unrestricted hearing on the contralateral side: Secondary | ICD-10-CM | POA: Diagnosis not present

## 2022-12-06 ENCOUNTER — Ambulatory Visit: Payer: Medicare HMO | Admitting: Podiatry

## 2022-12-07 ENCOUNTER — Ambulatory Visit: Payer: Medicare HMO | Admitting: Endocrinology

## 2022-12-12 NOTE — Progress Notes (Unsigned)
[facility] [address] [place]. [place] [address] [phone]       OFFICE FOLLOW UP NOTE  [name] Date of Birth:  December 18, 1951 Medical Record Number:  [id number]    Primary neurologist: Dr. Krista Blue Reason for visit: Altered consciousness possible partial seizure    SUBJECTIVE:   CHIEF COMPLAINT:  No chief complaint on file.   HPI:   Update 12/13/2022 JM: Patient returns for 29-monthfollow-up.  [name] [name] denies any additional passing out episodes.  Continues on lamotrigine 100 mg twice daily, denies side effects.  When seen by ENT for right ear tinnitus and possible asymmetric SNHL, plans on conservative monitoring for now, per note if worsening plans on proceeding with MRI to rule out VS.       History provided for reference purposes only Update 07/10/2022 JM: Patient returns for follow-up visit after prior initial visit 4 months ago with Dr. YKrista Bluefor passing out episode and initiated lamotrigine.  Extensive work-up including MRI brain, EEG and lab work largely unremarkable.  [name] [name] has not had any additional syncopal events.  [name] [name] does report intermittent high pitched right ear ringing, at times can be more pronounced and will typically be associated with fatigue, will lay down and nap with resolution of symptoms upon awakening. No loss of consciousness, altered mental status or any other associated seizure symptoms.  Tolerating lamotrigine 100 mg twice daily, denies side effects.  No further questions at this time.  MRI brain w wo contrast 04/07/2022 IMPRESSION: This MRI of the brain with and without contrast shows the following: 1.   Mild generalized cortical atrophy, just minimally increased compared to the 2015 MRI and likely normal for age. 2.   Some scattered T2/FLAIR hyperintense foci in the cerebral hemispheres in the pons consistent with mild chronic microvascular ischemic change, essentially unchanged compared to the 2015 MRI. 3.   No acute findings.   Normal enhancement pattern.  EEG 03/16/2022 CONCLUSION: This is a  normal awake EEG.  There is no electrodiagnostic evidence of epileptiform discharge.   Consult visit 03/08/2022 Dr. YKrista Blue [name] GADSONis a 71year old male, seen in request by his primary care physician Dr. NCharlott Rakes MD For evaluation of passing out episode,   I reviewed and summarized the referring note.PMHX. Atrial fibrillation, taking amiodarone digoxin Hypertension Hyperlipidemia   [name] [name] reported 1 episode in December 2022, [name] [name] woke up from sleep using bathroom to urinate, [name] [name] could remember [name] [name] finish urination, then woke up on the back, apparently [name] [name] fell to his right side into the adjacent bathtub, With the shower curtain all over him,   [name] [name] is sleep with TV on all night, based on the program, [name] [name] thought [name] [name] was out for about 10 minutes  There was no self injury, [name] [name] was able to get out of the bathtub, no tongue biting, no incontinence  [name] [name] lives by himself in an apartment, need to his people regularly does community service.  [name] [name] does wake up frequently during middle of the night, also complains of excessive daytime fatigue, sleepiness, taking frequent cat naps,   I saw him previously in 2018 for passing out spells  At that time [name] [name] complains of intermittent episode of sudden onset right arm shaking, lasting for few minutes, no loss of consciousness, not generalized to left side, no right facial neck involvement,  [name] [name] does not have a family history of epilepsy, brother and father had a diagnosis of seizure presented with similar event  At that time, extensive  laboratory evaluation including MRI of the brain, EEG showed no significant abnormality. Laboratory evaluations showed no significant abnormality as well, I have suggested him empirically treated with Keppra, [name] [name] is hesitate to stop the medications, loss of follow-up    Over the past few years, [name] [name] reported different spells, [name] [name] often have sudden onset mild  cricket ringing sound in his ear, lasting for few minutes, to the point that [name] [name] called the past control, worry about the cricket in his apartment, but the reported cricket sound was not heard by other people, the recurrent episode happened about every 2 to 3 months, no loss of consciousness       ROS:   14 system review of systems performed and negative with exception of those listed in HPI  PMH:  Past Medical History:  Diagnosis Date   Anemia    Asthma    Atrial fibrillation (HCC)    CHF (congestive heart failure) (HCC)    COPD (chronic obstructive pulmonary disease) ([place])    Heart disease    Hypertension    Syncope     PSH:  Past Surgical History:  Procedure Laterality Date   CARDIAC CATHETERIZATION N/A 12/08/2015   Procedure: Left Heart Cath and Coronary Angiography;  Surgeon: Charolette Forward, MD;  Location: [facility];  Service: Cardiovascular;  Laterality: N/A;   ESOPHAGUS SURGERY     SHOULDER CLOSED REDUCTION Left 12/01/2017   Procedure: CLOSED REDUCTION SHOULDER;  Surgeon: Altamese [place], MD;  Location: [facility];  Service: Orthopedics;  Laterality: Left;    Social History:  Social History   Socioeconomic History   Marital status: Single    Spouse name: Not on file   Number of children: 1   Years of education: college   Highest education level: Not on file  Occupational History   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    Employer: [employer]  Tobacco Use   Smoking status: Never   Smokeless tobacco: Never  Vaping Use   Vaping Use: Never used  Substance and Sexual Activity   Alcohol use: No    Alcohol/week: 0.0 standard drinks of alcohol   Drug use: No   Sexual activity: Not Currently  Other Topics Concern   Not on file  Social History Narrative   Patient lives at home alone and [name] [name] is single.   Patient is semi retired.    Education college.   Right handed.   Caffeine two cokes daily.   Social Determinants of Health   Financial Resource Strain: Low Risk   (08/27/2021)   Overall Financial Resource Strain (CARDIA)    Difficulty of Paying Living Expenses: Not hard at all  Food Insecurity: No Food Insecurity (08/27/2021)   Hunger Vital Sign    Worried About Running Out of Food in the Last Year: Never true    Ran Out of Food in the Last Year: Never true  Transportation Needs: No Transportation Needs (08/27/2021)   PRAPARE - Hydrologist (Medical): No    Lack of Transportation (Non-Medical): No  Physical Activity: Insufficiently Active (08/27/2021)   Exercise Vital Sign    Days of Exercise per Week: 7 days    Minutes of Exercise per Session: 10 min  Stress: No Stress Concern Present (08/27/2021)   [place]    Feeling of Stress : Not at all  Social Connections: Moderately Isolated (08/27/2021)   Social Connection and Isolation Panel [NHANES]    Frequency  of Communication with Friends and Family: More than three times a week    Frequency of Social Gatherings with Friends and Family: More than three times a week    Attends Religious Services: More than 4 times per year    Active Member of [employer] or Organizations: No    Attends Archivist Meetings: Never    Marital Status: Never married  Intimate Partner Violence: Not At Risk (08/27/2021)   Humiliation, Afraid, Rape, and Kick questionnaire    Fear of Current or Ex-Partner: No    Emotionally Abused: No    Physically Abused: No    Sexually Abused: No    Family History:  Family History  Problem Relation Age of Onset   Cancer Mother     Medications:   Current Outpatient Medications on File Prior to Visit  Medication Sig Dispense Refill   acetaminophen (TYLENOL) 325 MG tablet Take 2 tablets (650 mg total) by mouth every 6 (six) hours as needed for mild pain (or Fever >/= 101). 30 tablet 0   albuterol (PROVENTIL HFA;VENTOLIN HFA) 108 (90 Base) MCG/ACT inhaler Inhale 2 puffs into the lungs every 6  (six) hours as needed. 1 Inhaler 5   albuterol (PROVENTIL) (2.5 MG/3ML) 0.083% nebulizer solution Take 3 mLs (2.5 mg total) by nebulization every 6 (six) hours as needed for wheezing or shortness of breath. 150 mL 1   allopurinol (ZYLOPRIM) 300 MG tablet Take 1 tablet (300 mg total) by mouth daily. 90 tablet 1   amiodarone (PACERONE) 200 MG tablet Take 1 tablet (200 mg total) by mouth 2 (two) times daily. 60 tablet 2   ARNUITY ELLIPTA 100 MCG/ACT AEPB Inhale 1 puff into the lungs daily. 30 each 3   aspirin EC 81 MG tablet Take 81 mg by mouth daily.     atorvastatin (LIPITOR) 40 MG tablet Take 1 tablet (40 mg total) by mouth daily. 90 tablet 1   clomiPHENE (CLOMID) 50 MG tablet 1/4 tab daily     clotrimazole-betamethasone (LOTRISONE) cream Apply 1 application topically 2 (two) times daily as needed (FOR RASH).     diclofenac Sodium (VOLTAREN) 1 % GEL Apply 4 g topically 4 (four) times daily. 100 g 1   digoxin (LANOXIN) 0.125 MG tablet Take 1 tablet (0.125 mg total) by mouth daily. 30 tablet 3   empagliflozin (JARDIANCE) 10 MG TABS tablet Take 1 tablet (10 mg total) by mouth daily before breakfast. 90 tablet 1   ferrous sulfate 325 (65 FE) MG tablet Take 325 mg by mouth daily with breakfast. Reported on 03/16/2016     furosemide (LASIX) 20 MG tablet TAKE 1 TABLET BY MOUTH EVERY DAY 90 tablet 1   lamoTRIgine (LAMICTAL) 100 MG tablet Take 1 tablet (100 mg total) by mouth 2 (two) times daily. 60 tablet 11   losartan (COZAAR) 100 MG tablet Take 100 mg by mouth daily.  3   methocarbamol (ROBAXIN) 500 MG tablet TAKE 1 TABLET (500 MG TOTAL) BY MOUTH EVERY 8 (EIGHT) HOURS AS NEEDED FOR MUSCLE SPASM/PAIN 90 tablet 0   metoprolol succinate (TOPROL-XL) 25 MG 24 hr tablet Take 1 tablet (25 mg total) by mouth daily. 90 tablet 1   nitroGLYCERIN (NITROSTAT) 0.4 MG SL tablet Place 1 tablet (0.4 mg total) under the tongue every 5 (five) minutes as needed for chest pain. 25 tablet 1   pantoprazole (PROTONIX) 40 MG  tablet Take 1 tablet (40 mg total) by mouth daily. 90 tablet 1   potassium chloride (K-DUR)  10 MEQ tablet Take 10 mEq by mouth daily.     sildenafil (VIAGRA) 100 MG tablet Take 1 tablet (100 mg total) by mouth daily as needed for erectile dysfunction. 10 tablet 11   tacrolimus (PROTOPIC) 0.1 % ointment      tamoxifen (NOLVADEX) 10 MG tablet Take 1 tablet (10 mg total) by mouth 2 (two) times daily. 180 tablet 3   tamsulosin (FLOMAX) 0.4 MG CAPS capsule Take 1 capsule (0.4 mg total) by mouth daily. 90 capsule 1   No current facility-administered medications on file prior to visit.    Allergies:  No Known Allergies    OBJECTIVE:  Physical Exam  There were no vitals filed for this visit.  There is no height or weight on file to calculate BMI. No results found.   General: well developed, well nourished, very pleasant elderly African-American male, seated, in no evident distress Head: head normocephalic and atraumatic.   Neck: supple with no carotid or supraclavicular bruits Cardiovascular: regular rate and rhythm, no murmurs Musculoskeletal: no deformity Skin:  no rash/petichiae Vascular:  Normal pulses all extremities   Neurologic Exam Mental Status: Awake and fully alert. Oriented to place and time. Recent and remote memory intact. Attention span, concentration and fund of knowledge appropriate. Mood and affect appropriate.  Cranial Nerves: Pupils equal, briskly reactive to light. Extraocular movements full without nystagmus. Visual fields full to confrontation.  Decreased hearing in right ear by finger rubbing.  Facial sensation intact. Face, tongue, palate moves normally and symmetrically.  Motor: Normal bulk and tone. Normal strength in all tested extremity muscles Sensory.: intact to touch , pinprick , position and vibratory sensation.  Coordination: Rapid alternating movements normal in all extremities. Finger-to-nose and heel-to-shin performed accurately bilaterally. Gait and  Station: Arises from chair without difficulty. Stance is normal. Gait demonstrates normal stride length and balance without use of AD. Tandem walk and heel toe without difficulty.  Reflexes: 1+ and symmetric. Toes downgoing.         ASSESSMENT/PLAN: [name] [name] is a 71 y.o. year old male   Recurrent episode of sudden passing out,             No additional passing out episodes  Continue lamotrigine 100 mg twice daily There was also family history of epilepsy, different recurrent episodes such as uncontrollable right arm jerking, sudden onset loud cricket sound in his ear, lasting for few minutes, no loss of consciousness,             Laboratory evaluation suggestive of iron deficiency anemia (chronic), no significant abnormalities             MRI brain 03/2022 no acute findings             EEG 02/2022 unremarkable               Right ear intermittent tinnitus  Referral placed to ENT for further evaluation  If exam normal, by consider increasing lamotrigine dosage for possible partial type seizures as these episodes can be associated with increased fatigue      Follow up in 4 months or call earlier if needed   CC:  PCP: Charlott Rakes, MD    I spent 24 minutes of face-to-face and non-face-to-face time with patient.  This included previsit chart review, lab review, study review, order entry, electronic health record documentation, patient education regarding above diagnoses and treatment plan and answered all the questions to patient's satisfaction   Frann Rider, AGNP-BC  [facility] [address] [place], [place] [address]  Phone [phone] Fax [phone] Note: This document was prepared with digital dictation and possible smart phrase technology. Any transcriptional errors that result from this process are unintentional.

## 2022-12-13 ENCOUNTER — Encounter: Payer: Self-pay | Admitting: Adult Health

## 2022-12-13 ENCOUNTER — Ambulatory Visit (INDEPENDENT_AMBULATORY_CARE_PROVIDER_SITE_OTHER): Payer: Medicare HMO | Admitting: Adult Health

## 2022-12-13 VITALS — BP 145/75 | HR 76 | Ht 67.0 in | Wt 240.0 lb

## 2022-12-13 DIAGNOSIS — H9311 Tinnitus, right ear: Secondary | ICD-10-CM

## 2022-12-13 DIAGNOSIS — R251 Tremor, unspecified: Secondary | ICD-10-CM | POA: Diagnosis not present

## 2022-12-13 DIAGNOSIS — R404 Transient alteration of awareness: Secondary | ICD-10-CM | POA: Diagnosis not present

## 2022-12-13 MED ORDER — LAMOTRIGINE 100 MG PO TABS: 100.0000 mg | ORAL_TABLET | Freq: Two times a day (BID) | ORAL | 3 refills | Status: DC

## 2022-12-13 NOTE — Patient Instructions (Addendum)
Your Plan:  Continue lamotrigine '100mg'$  twice daily   Will check lab work to look for reversible causes of tremor    Follow up with Dr. Krista Blue in 6 months or call earlier if needed     Thank you for coming to see [place] at [facility]. I hope we have been able to provide you high quality care today.  You may receive a patient satisfaction survey over the next few weeks. We would appreciate your feedback and comments so that we may continue to improve ourselves and the health of our patients.

## 2022-12-14 ENCOUNTER — Telehealth: Payer: Self-pay

## 2022-12-14 LAB — B12 AND FOLATE PANEL
Folate: 17.9 ng/mL (ref >3.0)
Vitamin B-12: 408 pg/mL (ref 232–1245)

## 2022-12-14 LAB — TSH: TSH: 1.31 u[IU]/mL (ref 0.450–4.500)

## 2022-12-14 NOTE — Telephone Encounter (Signed)
-----   Message from Frann Rider, NP sent at 12/14/2022  7:21 AM EST ----- Please advise patient that recent lab work was satisfactory. Thank you.

## 2022-12-14 NOTE — Telephone Encounter (Signed)
Called patient and relayed the lab results. Pt verbalized understanding. Pt had no questions at this time but was encouraged to call back if questions arise.

## 2023-01-01 ENCOUNTER — Ambulatory Visit: Payer: Medicare HMO | Admitting: Podiatry

## 2023-01-15 ENCOUNTER — Encounter: Payer: Self-pay | Admitting: Podiatry

## 2023-01-15 ENCOUNTER — Ambulatory Visit (INDEPENDENT_AMBULATORY_CARE_PROVIDER_SITE_OTHER): Payer: Medicare HMO | Admitting: Podiatry

## 2023-01-15 DIAGNOSIS — M79675 Pain in left toe(s): Secondary | ICD-10-CM | POA: Diagnosis not present

## 2023-01-15 DIAGNOSIS — B351 Tinea unguium: Secondary | ICD-10-CM | POA: Diagnosis not present

## 2023-01-15 DIAGNOSIS — J449 Chronic obstructive pulmonary disease, unspecified: Secondary | ICD-10-CM | POA: Diagnosis not present

## 2023-01-15 DIAGNOSIS — M79674 Pain in right toe(s): Secondary | ICD-10-CM | POA: Diagnosis not present

## 2023-01-15 NOTE — Progress Notes (Signed)
This patient presents to the office with chief complaint of long thick painful nails.  Patient says the nails are painful walking and wearing shoes.  This patient is unable to self treat.  This patient is unable to trim his nails since she is unable to reach his nails.  She presents to the office for preventative foot care services.  General Appearance  Alert, conversant and in no acute stress.  Vascular  Dorsalis pedis and posterior tibial  pulses are palpable  bilaterally.  Capillary return is within normal limits  bilaterally. Temperature is within normal limits  bilaterally.  Neurologic  Senn-Weinstein monofilament wire test within normal limits  bilaterally. Muscle power within normal limits bilaterally.  Nails Thick disfigured discolored nails with subungual debris  from hallux to fifth toes bilaterally. No evidence of bacterial infection or drainage bilaterally.  Orthopedic  No limitations of motion  feet .  No crepitus or effusions noted.  No bony pathology or digital deformities noted.  Skin  normotropic skin with no porokeratosis noted bilaterally.  No signs of infections or ulcers noted.     Onychomycosis  Nails  B/L.  Pain in right toes  Pain in left toes  Debridement of nails both feet followed trimming the nails with dremel tool.    RTC 3 months.   Gardiner Barefoot DPM

## 2023-01-22 ENCOUNTER — Other Ambulatory Visit: Payer: Self-pay | Admitting: Family Medicine

## 2023-01-22 DIAGNOSIS — M1A0710 Idiopathic chronic gout, right ankle and foot, without tophus (tophi): Secondary | ICD-10-CM

## 2023-01-22 DIAGNOSIS — R399 Unspecified symptoms and signs involving the genitourinary system: Secondary | ICD-10-CM

## 2023-01-22 NOTE — Telephone Encounter (Signed)
Future visit in 2 days.  Requested Prescriptions  Pending Prescriptions Disp Refills   tamsulosin (FLOMAX) 0.4 MG CAPS capsule [Pharmacy Med Name: TAMSULOSIN HCL 0.4 MG CAPSULE] 90 capsule 0    Sig: TAKE 1 CAPSULE BY MOUTH EVERY DAY     Urology: Alpha-Adrenergic Blocker Failed - 01/22/2023  1:34 AM      Failed - PSA in normal range and within 360 days    PSA  Date Value Ref Range Status  12/03/2020 0.59 0.10 - 4.00 ng/mL Final    Comment:    Test performed using Access Hybritech PSA Assay, a parmagnetic partical, chemiluminecent immunoassay.         Failed - Last BP in normal range    BP Readings from Last 1 Encounters:  12/13/22 (!) 145/75         Passed - Valid encounter within last 12 months    Recent Outpatient Visits           6 months ago Prediabetes   [place], [place], MD   9 months ago Hypertensive heart disease with chronic diastolic congestive heart failure [facility])   [place] Charlott Rakes, MD   1 year ago Syncope, unspecified syncope type   [place], Charlane Ferretti, MD   1 year ago Tinnitus of right ear   [place], Charlane Ferretti, MD   1 year ago Obstructive sleep apnea syndrome   [place] Elsie Stain, MD       Future Appointments             In 2 days Charlott Rakes, MD [place]             allopurinol (ZYLOPRIM) 300 MG tablet [Pharmacy Med Name: ALLOPURINOL 300 MG TABLET] 90 tablet 0    Sig: TAKE 1 TABLET BY [place] DAY     Endocrinology:  Gout Agents - allopurinol Failed - 01/22/2023  1:34 AM      Failed - Uric Acid in normal range and within 360 days    Uric Acid  Date Value Ref Range Status  09/17/2018 9.5 (H) 3.7 - 8.6 mg/dL Final    Comment:               Therapeutic target for gout patients: <6.0          Failed - Cr in normal range and within 360 days    Creat  Date Value Ref Range Status  03/12/2015 1.06 0.50 - 1.35 mg/dL Final   Creatinine, Ser  Date Value Ref Range Status  07/25/2022 1.28 (H) 0.76 - 1.27 mg/dL Final   Creatinine,U  Date Value Ref Range Status  01/13/2011 148.1 mg/dL Final    Comment:    See lab report for associated comment(s)         Passed - Valid encounter within last 12 months    Recent Outpatient Visits           6 months ago Prediabetes   [place], Charlane Ferretti, MD   9 months ago Hypertensive heart disease with chronic diastolic congestive heart failure [facility])   [place] Charlott Rakes, MD   1 year ago Syncope, unspecified syncope type   [place], [place],  MD   1 year ago Tinnitus of right ear   [place], Charlane Ferretti, MD   1 year ago Obstructive sleep apnea syndrome   [place] Elsie Stain, MD       Future Appointments             In 2 days Charlott Rakes, MD [place] within normal limits and completed in the last 12 months    WBC  Date Value Ref Range Status  03/08/2022 7.8 3.4 - 10.8 x10E3/uL Final  01/20/2019 8.3 4.0 - 10.5 K/uL Final   RBC  Date Value Ref Range Status  03/08/2022 5.33 4.14 - 5.80 x10E6/uL Final  01/20/2019 5.22 4.22 - 5.81 MIL/uL Final   Hemoglobin  Date Value Ref Range Status  03/08/2022 11.5 (L) 13.0 - 17.7 g/dL Final   Hematocrit  Date Value Ref Range Status  03/08/2022 37.6 37.5 - 51.0 % Final   MCHC  Date Value Ref Range Status  03/08/2022 30.6 (L) 31.5 - 35.7 g/dL Final  01/20/2019 29.5 (L) 30.0 - 36.0 g/dL Final   [facility]  Date Value Ref Range Status  03/08/2022 21.6 (L) 26.6 - 33.0 pg Final  01/20/2019 21.1 (L) 26.0 - 34.0 pg  Final   MCV  Date Value Ref Range Status  03/08/2022 71 (L) 79 - 97 fL Final   No results found for: "PLTCOUNTKUC", "LABPLAT", "POCPLA" RDW  Date Value Ref Range Status  03/08/2022 16.0 (H) 11.6 - 15.4 % Final

## 2023-01-24 ENCOUNTER — Encounter: Payer: Self-pay | Admitting: Family Medicine

## 2023-01-24 ENCOUNTER — Ambulatory Visit: Payer: Medicare HMO | Attending: Family Medicine | Admitting: Family Medicine

## 2023-01-24 VITALS — BP 134/79 | HR 64 | Temp 98.2°F | Ht 67.0 in | Wt 238.4 lb

## 2023-01-24 DIAGNOSIS — H9313 Tinnitus, bilateral: Secondary | ICD-10-CM

## 2023-01-24 DIAGNOSIS — K219 Gastro-esophageal reflux disease without esophagitis: Secondary | ICD-10-CM | POA: Diagnosis not present

## 2023-01-24 DIAGNOSIS — I110 Hypertensive heart disease with heart failure: Secondary | ICD-10-CM

## 2023-01-24 DIAGNOSIS — I480 Paroxysmal atrial fibrillation: Secondary | ICD-10-CM | POA: Diagnosis not present

## 2023-01-24 DIAGNOSIS — Z1211 Encounter for screening for malignant neoplasm of colon: Secondary | ICD-10-CM | POA: Diagnosis not present

## 2023-01-24 DIAGNOSIS — M1A0710 Idiopathic chronic gout, right ankle and foot, without tophus (tophi): Secondary | ICD-10-CM

## 2023-01-24 DIAGNOSIS — R29818 Other symptoms and signs involving the nervous system: Secondary | ICD-10-CM

## 2023-01-24 DIAGNOSIS — R399 Unspecified symptoms and signs involving the genitourinary system: Secondary | ICD-10-CM

## 2023-01-24 DIAGNOSIS — I5032 Chronic diastolic (congestive) heart failure: Secondary | ICD-10-CM | POA: Diagnosis not present

## 2023-01-24 MED ORDER — FUROSEMIDE 20 MG PO TABS: 20.0000 mg | ORAL_TABLET | Freq: Every day | ORAL | 1 refills | Status: DC

## 2023-01-24 MED ORDER — TAMSULOSIN HCL 0.4 MG PO CAPS: 0.4000 mg | ORAL_CAPSULE | Freq: Every day | ORAL | 1 refills | Status: DC

## 2023-01-24 MED ORDER — EMPAGLIFLOZIN 10 MG PO TABS: 10.0000 mg | ORAL_TABLET | Freq: Every day | ORAL | 1 refills | Status: DC

## 2023-01-24 MED ORDER — ATORVASTATIN CALCIUM 40 MG PO TABS: 40.0000 mg | ORAL_TABLET | Freq: Every day | ORAL | 1 refills | Status: DC

## 2023-01-24 MED ORDER — PANTOPRAZOLE SODIUM 40 MG PO TBEC: 40.0000 mg | DELAYED_RELEASE_TABLET | Freq: Every day | ORAL | 1 refills | Status: DC

## 2023-01-24 MED ORDER — ALLOPURINOL 300 MG PO TABS: 300.0000 mg | ORAL_TABLET | Freq: Every day | ORAL | 1 refills | Status: DC

## 2023-01-24 NOTE — Patient Instructions (Signed)
Tinnitus ?Tinnitus refers to hearing a sound when there is no actual source for that sound. This is often described as ringing in the ears. However, people with this condition may hear a variety of noises, in one ear or in both ears. ?The sounds of tinnitus can be soft, loud, or somewhere in between. Tinnitus can last for a few seconds or can be constant for days. It may go away without treatment and come back at various times. When tinnitus is constant or happens often, it can lead to other problems, such as trouble sleeping and trouble concentrating. ?Almost everyone experiences tinnitus at some point. Tinnitus is not the same as hearing loss. Tinnitus that is long-lasting (chronic) or comes back often (recurs) may require medical attention. ?What are the causes? ?The cause of tinnitus is often not known. In some cases, it can result from: ?Exposure to loud noises from machinery, music, or other sources. ?An object (foreign body) stuck in the ear. ?Earwax buildup. ?Drinking alcohol or caffeine. ?Taking certain medicines. ?Age-related hearing loss. ?It may also be caused by medical conditions such as: ?Ear or sinus infections. ?Heart diseases or high blood pressure. ?Allergies. ?M?ni?re's disease. ?Thyroid problems. ?Tumors. ?A weak, bulging blood vessel (aneurysm) near the ear. ?What increases the risk? ?The following factors may make you more likely to develop this condition: ?Exposure to loud noises. ?Age. Tinnitus is more likely in older individuals. ?Using alcohol or tobacco. ?What are the signs or symptoms? ?The main symptom of tinnitus is hearing a sound when there is no source for that sound. It may sound like: ?Buzzing. ?Sizzling. ?Ringing. ?Blowing air. ?Hissing. ?Whistling. ?Other sounds may include: ?Roaring. ?Running water. ?A musical note. ?Tapping. ?Humming. ?Symptoms may affect only one ear (unilateral) or both ears (bilateral). ?How is this diagnosed? ?Tinnitus is diagnosed based on your symptoms,  your medical history, and a physical exam. Your health care provider may do a thorough hearing test (audiologic exam) if your tinnitus: ?Is unilateral. ?Causes hearing difficulties. ?Lasts 6 months or longer. ?You may work with a health care provider who specializes in hearing disorders (audiologist). You may be asked questions about your symptoms and how they affect your daily life. You may have other tests done, such as: ?CT scan. ?MRI. ?An imaging test of how blood flows through your blood vessels (angiogram). ?How is this treated? ?Treating an underlying medical condition can sometimes make tinnitus go away. If your tinnitus continues, other treatments may include: ?Therapy and counseling to help you manage the stress of living with tinnitus. ?Sound generators to mask the tinnitus. These include: ?Tabletop sound machines that play relaxing sounds to help you fall asleep. ?Wearable devices that fit in your ear and play sounds or music. ?Acoustic neural stimulation. This involves using headphones to listen to music that contains an auditory signal. Over time, listening to this signal may change some pathways in your brain and make you less sensitive to tinnitus. This treatment is used for very severe cases when no other treatment is working. ?Using hearing aids or cochlear implants if your tinnitus is related to hearing loss. Hearing aids are worn in the outer ear. Cochlear implants are surgically placed in the inner ear. ?Follow these instructions at home: ?Managing symptoms ? ?  ? ?When possible, avoid being in loud places and being exposed to loud sounds. ?Wear hearing protection, such as earplugs, when you are exposed to loud noises. ?Use a white noise machine, a humidifier, or other devices to mask the sound of tinnitus. ?  Practice techniques for reducing stress, such as meditation, yoga, or deep breathing. Work with your health care provider if you need help with managing stress. ?Sleep with your head  slightly raised. This may reduce the impact of tinnitus. ?General instructions ?Do not use stimulants, such as nicotine, alcohol, or caffeine. Talk with your health care provider about other stimulants to avoid. Stimulants are substances that can make you feel alert and attentive by increasing certain activities in the body (such as heart rate and blood pressure). These substances may make tinnitus worse. ?Take over-the-counter and prescription medicines only as told by your health care provider. ?Try to get plenty of sleep each night. ?Keep all follow-up visits. This is important. ?Contact a health care provider if: ?Your tinnitus continues for 3 weeks or longer without stopping. ?You develop sudden hearing loss. ?Your symptoms get worse or do not get better with home care. ?You feel you are not able to manage the stress of living with tinnitus. ?Get help right away if: ?You develop tinnitus after a head injury. ?You have tinnitus along with any of the following: ?Dizziness. ?Nausea and vomiting. ?Loss of balance. ?Sudden, severe headache. ?Vision changes. ?Facial weakness or weakness of arms or legs. ?These symptoms may represent a serious problem that is an emergency. Do not wait to see if the symptoms will go away. Get medical help right away. Call your local emergency services (911 in the [place] Do not drive yourself to the hospital. ?Summary ?Tinnitus refers to hearing a sound when there is no actual source for that sound. This is often described as ringing in the ears. ?Symptoms may affect only one ear (unilateral) or both ears (bilateral). ?Use a white noise machine, a humidifier, or other devices to mask the sound of tinnitus. ?Do not use stimulants, such as nicotine, alcohol, or caffeine. These substances may make tinnitus worse. ?This information is not intended to replace advice given to you by your health care provider. Make sure you discuss any questions you have with your health care  provider. ?Document Revised: 11/15/2020 Document Reviewed: 11/15/2020 ?Elsevier Patient Education ? 2023 Elsevier Inc. ? ?

## 2023-01-24 NOTE — Progress Notes (Unsigned)
Sleeping more than usual.

## 2023-01-24 NOTE — Progress Notes (Unsigned)
Subjective:  Patient ID: [name] [name], male    DOB: [birth year]  Age: 72 y.o. MRN: [id number]  CC: Hypertension   HPI [name] [name] is a 72 y.o. year old male with a history of  paroxysmal A. fib, atrial flutter, diastolic congestive heart failure (EF 55-60% in 11/2017), CAD (status post cardiac cath in 11/2015 - mid LAD 20% stenosis, dist LAD 40% stenosis, Ost RCA to prox RCA 30% stenosis, normal LV systolic function), sick sinus syndrome, COPD, GERD, seizures (previously managed by [facility]), BPH, hypogonadism (managed by endocrine),Gout here for a follow up visit.   Interval History:   He Complains of sleeping a lot and is unsure if this is fatigue. He sleeps good , sleeps 10pm - 4am. He states he is having to take 30 minute naps when he sits down during the day but when he is active he does not take a nap. He is unsure if he snores, as he lives alone but does think he snores.  He has some daytime somnolence but no daytime headache  Also has tinnitus which is chronic.  He complains of ongoing nocturia and frequency. He is on Flomax and has been adherent with it. He is doing well on his antiseizure medication and follows up with neurology.  His gout is stable and from a cardiac standpoint he has no worsening of his symptoms.  He does have chronic dyspnea on exertion but does not have pedal edema, chest pain or weight gain.  Continues to follow-up with his cardiologist Dr. Terrence Dupont. Past Medical History:  Diagnosis Date   Anemia    Asthma    Atrial fibrillation (HCC)    CHF (congestive heart failure) (HCC)    COPD (chronic obstructive pulmonary disease) ([place])    Heart disease    Hypertension    Syncope     Past Surgical History:  Procedure Laterality Date   CARDIAC CATHETERIZATION N/A 12/08/2015   Procedure: Left Heart Cath and Coronary Angiography;  Surgeon: Charolette Forward, MD;  Location: [facility];  Service: Cardiovascular;  Laterality: N/A;   ESOPHAGUS SURGERY      SHOULDER CLOSED REDUCTION Left 12/01/2017   Procedure: CLOSED REDUCTION SHOULDER;  Surgeon: Altamese Lewiston, MD;  Location: [facility];  Service: Orthopedics;  Laterality: Left;    Family History  Problem Relation Age of Onset   Cancer Mother     Social History   Socioeconomic History   Marital status: Single    Spouse name: Not on file   Number of children: 1   Years of education: college   Highest education level: Not on file  Occupational History   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    Employer: [employer]  Tobacco Use   Smoking status: Never   Smokeless tobacco: Never  Vaping Use   Vaping Use: Never used  Substance and Sexual Activity   Alcohol use: No    Alcohol/week: 0.0 standard drinks of alcohol   Drug use: No   Sexual activity: Not Currently  Other Topics Concern   Not on file  Social History Narrative   Patient lives at home alone and he is single.   Patient is semi retired.    Education college.   Right handed.   Caffeine two cokes daily.   Social Determinants of Health   Financial Resource Strain: Low Risk  (08/27/2021)   Overall Financial Resource Strain (CARDIA)    Difficulty of Paying Living Expenses: Not hard at all  Food Insecurity: No  Food Insecurity (08/27/2021)   Hunger Vital Sign    Worried About Running Out of Food in the Last Year: Never true    Ran Out of Food in the Last Year: Never true  Transportation Needs: No Transportation Needs (08/27/2021)   PRAPARE - Hydrologist (Medical): No    Lack of Transportation (Non-Medical): No  Physical Activity: Insufficiently Active (08/27/2021)   Exercise Vital Sign    Days of Exercise per Week: 7 days    Minutes of Exercise per Session: 10 min  Stress: No Stress Concern Present (08/27/2021)   [place]    Feeling of Stress : Not at all  Social Connections: Moderately Isolated (08/27/2021)   Social Connection and Isolation  Panel [NHANES]    Frequency of Communication with Friends and Family: More than three times a week    Frequency of Social Gatherings with Friends and Family: More than three times a week    Attends Religious Services: More than 4 times per year    Active Member of [employer] or Organizations: No    Attends Archivist Meetings: Never    Marital Status: Never married    No Known Allergies  Outpatient Medications Prior to Visit  Medication Sig Dispense Refill   acetaminophen (TYLENOL) 325 MG tablet Take 2 tablets (650 mg total) by mouth every 6 (six) hours as needed for mild pain (or Fever >/= 101). 30 tablet 0   albuterol (PROVENTIL HFA;VENTOLIN HFA) 108 (90 Base) MCG/ACT inhaler Inhale 2 puffs into the lungs every 6 (six) hours as needed. 1 Inhaler 5   albuterol (PROVENTIL) (2.5 MG/3ML) 0.083% nebulizer solution Take 3 mLs (2.5 mg total) by nebulization every 6 (six) hours as needed for wheezing or shortness of breath. 150 mL 1   amiodarone (PACERONE) 200 MG tablet Take 1 tablet (200 mg total) by mouth 2 (two) times daily. 60 tablet 2   ARNUITY ELLIPTA 100 MCG/ACT AEPB Inhale 1 puff into the lungs daily. 30 each 3   aspirin EC 81 MG tablet Take 81 mg by mouth daily.     clomiPHENE (CLOMID) 50 MG tablet 1/4 tab daily     clotrimazole-betamethasone (LOTRISONE) cream Apply 1 application topically 2 (two) times daily as needed (FOR RASH).     diclofenac Sodium (VOLTAREN) 1 % GEL Apply 4 g topically 4 (four) times daily. 100 g 1   digoxin (LANOXIN) 0.125 MG tablet Take 1 tablet (0.125 mg total) by mouth daily. 30 tablet 3   ferrous sulfate 325 (65 FE) MG tablet Take 325 mg by mouth daily with breakfast. Reported on 03/16/2016     lamoTRIgine (LAMICTAL) 100 MG tablet Take 1 tablet (100 mg total) by mouth 2 (two) times daily. 180 tablet 3   losartan (COZAAR) 100 MG tablet Take 100 mg by mouth daily.  3   methocarbamol (ROBAXIN) 500 MG tablet TAKE 1 TABLET (500 MG TOTAL) BY MOUTH EVERY 8 (EIGHT)  HOURS AS NEEDED FOR MUSCLE SPASM/PAIN 90 tablet 0   metoprolol succinate (TOPROL-XL) 25 MG 24 hr tablet Take 1 tablet (25 mg total) by mouth daily. 90 tablet 1   nitroGLYCERIN (NITROSTAT) 0.4 MG SL tablet Place 1 tablet (0.4 mg total) under the tongue every 5 (five) minutes as needed for chest pain. 25 tablet 1   potassium chloride (K-DUR) 10 MEQ tablet Take 10 mEq by mouth daily.     sildenafil (VIAGRA) 100 MG tablet Take  1 tablet (100 mg total) by mouth daily as needed for erectile dysfunction. 10 tablet 11   tacrolimus (PROTOPIC) 0.1 % ointment      tamoxifen (NOLVADEX) 10 MG tablet Take 1 tablet (10 mg total) by mouth 2 (two) times daily. 180 tablet 3   allopurinol (ZYLOPRIM) 300 MG tablet TAKE 1 TABLET BY MOUTH EVERY DAY 90 tablet 0   atorvastatin (LIPITOR) 40 MG tablet Take 1 tablet (40 mg total) by mouth daily. 90 tablet 1   empagliflozin (JARDIANCE) 10 MG TABS tablet Take 1 tablet (10 mg total) by mouth daily before breakfast. 90 tablet 1   furosemide (LASIX) 20 MG tablet TAKE 1 TABLET BY MOUTH EVERY DAY 90 tablet 1   pantoprazole (PROTONIX) 40 MG tablet Take 1 tablet (40 mg total) by mouth daily. 90 tablet 1   tamsulosin (FLOMAX) 0.4 MG CAPS capsule TAKE 1 CAPSULE BY MOUTH EVERY DAY 90 capsule 0   No facility-administered medications prior to visit.     ROS Review of Systems  Constitutional:  Negative for activity change and appetite change.  HENT:  Positive for tinnitus. Negative for sinus pressure and sore throat.   Respiratory:  Negative for chest tightness, shortness of breath and wheezing.   Cardiovascular:  Negative for chest pain and palpitations.  Gastrointestinal:  Negative for abdominal distention, abdominal pain and constipation.  Genitourinary:  Positive for frequency.  Musculoskeletal: Negative.   Psychiatric/Behavioral:  Negative for behavioral problems and dysphoric mood.     Objective:  BP 134/79   Pulse 64   Temp 98.2 F (36.8 C) (Oral)   Ht '5\' 7"'$  (1.702  m)   Wt 238 lb 6.4 oz (108.1 kg)   SpO2 99%   BMI 37.34 kg/m      01/24/2023    2:24 PM 12/13/2022   12:39 PM 07/25/2022    2:16 PM  BP/Weight  Systolic BP 660 630 160  Diastolic BP 79 75 79  Wt. (Lbs) 238.4 240 231.4  BMI 37.34 kg/m2 37.59 kg/m2 36.24 kg/m2      Physical Exam Constitutional:      Appearance: He is well-developed. He is obese.  Cardiovascular:     Rate and Rhythm: Normal rate.     Heart sounds: Normal heart sounds. No murmur heard. Pulmonary:     Effort: Pulmonary effort is normal.     Breath sounds: Normal breath sounds. No wheezing or rales.  Chest:     Chest wall: No tenderness.  Abdominal:     General: Bowel sounds are normal. There is no distension.     Palpations: Abdomen is soft. There is no mass.     Tenderness: There is no abdominal tenderness.  Musculoskeletal:        General: Normal range of motion.     Right lower leg: No edema.     Left lower leg: No edema.  Neurological:     Mental Status: He is alert and oriented to person, place, and time.  Psychiatric:        Mood and Affect: Mood normal.        Latest Ref Rng & Units 07/25/2022    3:01 PM 03/08/2022   12:15 PM 10/07/2021   10:17 AM  CMP  Glucose 70 - 99 mg/dL 76  88  82   BUN 8 - 27 mg/dL '14  11  11   '$ Creatinine 0.76 - 1.27 mg/dL 1.28  1.12  1.27   Sodium 134 - 144 mmol/L 142  140  139   Potassium 3.5 - 5.2 mmol/L 3.8  4.0  4.2   Chloride 96 - 106 mmol/L 103  102  101   CO2 20 - 29 mmol/L '27  27  27   '$ Calcium 8.6 - 10.2 mg/dL 9.5  9.0  9.2   Total Protein 6.0 - 8.5 g/dL 7.6  7.6  7.6   Total Bilirubin 0.0 - 1.2 mg/dL <0.2  0.4  0.3   Alkaline Phos 44 - 121 IU/L 93  102  103   AST 0 - 40 IU/L '20  24  23   '$ ALT 0 - 44 IU/L '10  14  13     '$ Lipid Panel     Component Value Date/Time   CHOL 146 10/07/2021 1017   TRIG 85 10/07/2021 1017   HDL 45 10/07/2021 1017   CHOLHDL 3.2 10/07/2021 1017   CHOLHDL 2.5 12/09/2015 0133   VLDL 11 12/09/2015 0133   LDLCALC 85 10/07/2021  1017    CBC    Component Value Date/Time   WBC 7.8 03/08/2022 1215   WBC 8.3 01/20/2019 0345   RBC 5.33 03/08/2022 1215   RBC 5.22 01/20/2019 0345   HGB 11.5 (L) 03/08/2022 1215   HCT 37.6 03/08/2022 1215   PLT 357 03/08/2022 1215   MCV 71 (L) 03/08/2022 1215   MCH 21.6 (L) 03/08/2022 1215   MCH 21.1 (L) 01/20/2019 0345   MCHC 30.6 (L) 03/08/2022 1215   MCHC 29.5 (L) 01/20/2019 0345   RDW 16.0 (H) 03/08/2022 1215   LYMPHSABS 2.4 03/08/2022 1215   MONOABS 1.2 (H) 12/01/2017 2242   EOSABS 0.6 (H) 03/08/2022 1215   BASOSABS 0.1 03/08/2022 1215    Lab Results  Component Value Date   HGBA1C 5.6 07/25/2022    Assessment & Plan:  1. 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  Dispense: 90 tablet; Refill: 1  2. Hypertensive heart disease with chronic diastolic congestive heart failure (HCC) Blood pressure is controlled Euvolemic with EF of 55 to 60% from 11/2027 Continue Lasix, SGLT2i, beta-blocker Continue to follow-up with cardiology Counseled on blood pressure goal of less than 130/80, low-sodium, DASH diet, medication compliance, 150 minutes of moderate intensity exercise per week. Discussed medication compliance, adverse effects. - CMP14+EGFR - atorvastatin (LIPITOR) 40 MG tablet; Take 1 tablet (40 mg total) by mouth daily.  Dispense: 90 tablet; Refill: 1 - empagliflozin (JARDIANCE) 10 MG TABS tablet; Take 1 tablet (10 mg total) by mouth daily before breakfast.  Dispense: 90 tablet; Refill: 1 - furosemide (LASIX) 20 MG tablet; Take 1 tablet (20 mg total) by mouth daily.  Dispense: 90 tablet; Refill: 1  3. Gastroesophageal reflux disease without esophagitis Controlled - pantoprazole (PROTONIX) 40 MG tablet; Take 1 tablet (40 mg total) by mouth daily.  Dispense: 90 tablet; Refill: 1  4. Lower urinary tract symptoms Uncontrolled Would love to increase Flomax dose  however he declines - tamsulosin (FLOMAX) 0.4 MG CAPS capsule; Take 1 capsule (0.4 mg total) by mouth daily.  Dispense: 90 capsule; Refill: 1 - Ambulatory referral to Urology  5. Screening for colon cancer Order for Cologuard placed  6. Suspected sleep apnea His symptoms are suspicious for sleep apnea especially with his weight The fact that he lives alone makes it difficult to ascertain if he has some of the other sleep apnea symptoms like snoring and waking up to catch his  breath - Split night study; Future  7. Tinnitus of both ears This is chronic We have discussed pathophysiology of tinnitus    Meds ordered this encounter  Medications   allopurinol (ZYLOPRIM) 300 MG tablet    Sig: Take 1 tablet (300 mg total) by mouth daily.    Dispense:  90 tablet    Refill:  1   atorvastatin (LIPITOR) 40 MG tablet    Sig: Take 1 tablet (40 mg total) by mouth daily.    Dispense:  90 tablet    Refill:  1   empagliflozin (JARDIANCE) 10 MG TABS tablet    Sig: Take 1 tablet (10 mg total) by mouth daily before breakfast.    Dispense:  90 tablet    Refill:  1   furosemide (LASIX) 20 MG tablet    Sig: Take 1 tablet (20 mg total) by mouth daily.    Dispense:  90 tablet    Refill:  1   pantoprazole (PROTONIX) 40 MG tablet    Sig: Take 1 tablet (40 mg total) by mouth daily.    Dispense:  90 tablet    Refill:  1   tamsulosin (FLOMAX) 0.4 MG CAPS capsule    Sig: Take 1 capsule (0.4 mg total) by mouth daily.    Dispense:  90 capsule    Refill:  1    Follow-up: Return in about 6 months (around 07/25/2023) for Chronic medical conditions.       Charlott Rakes, MD, FAAFP. [facility] [place], [place]   01/25/2023, 12:48 PM

## 2023-01-25 ENCOUNTER — Ambulatory Visit: Payer: Medicare HMO | Attending: Family Medicine

## 2023-01-25 DIAGNOSIS — I5032 Chronic diastolic (congestive) heart failure: Secondary | ICD-10-CM | POA: Diagnosis not present

## 2023-01-25 DIAGNOSIS — I110 Hypertensive heart disease with heart failure: Secondary | ICD-10-CM | POA: Diagnosis not present

## 2023-01-26 LAB — CMP14+EGFR
ALT: 14 IU/L (ref 0–44)
AST: 21 IU/L (ref 0–40)
Albumin/Globulin Ratio: 1 — ABNORMAL LOW (ref 1.2–2.2)
Albumin: 3.8 g/dL (ref 3.8–4.8)
Alkaline Phosphatase: 113 IU/L (ref 44–121)
BUN/Creatinine Ratio: 8 — ABNORMAL LOW (ref 10–24)
BUN: 9 mg/dL (ref 8–27)
Bilirubin Total: 0.2 mg/dL (ref 0.0–1.2)
CO2: 26 mmol/L (ref 20–29)
Calcium: 9.3 mg/dL (ref 8.6–10.2)
Chloride: 104 mmol/L (ref 96–106)
Creatinine, Ser: 1.19 mg/dL (ref 0.76–1.27)
Globulin, Total: 4 g/dL (ref 1.5–4.5)
Glucose: 81 mg/dL (ref 70–99)
Potassium: 4.1 mmol/L (ref 3.5–5.2)
Sodium: 143 mmol/L (ref 134–144)
Total Protein: 7.8 g/dL (ref 6.0–8.5)
eGFR: 64 mL/min/{1.73_m2} (ref >59)

## 2023-02-01 ENCOUNTER — Ambulatory Visit (INDEPENDENT_AMBULATORY_CARE_PROVIDER_SITE_OTHER): Payer: Medicare HMO | Admitting: Urology

## 2023-02-01 ENCOUNTER — Telehealth: Payer: Self-pay | Admitting: Emergency Medicine

## 2023-02-01 ENCOUNTER — Encounter: Payer: Self-pay | Admitting: Urology

## 2023-02-01 VITALS — BP 136/97 | HR 58 | Ht 67.0 in | Wt 230.0 lb

## 2023-02-01 DIAGNOSIS — R399 Unspecified symptoms and signs involving the genitourinary system: Secondary | ICD-10-CM | POA: Diagnosis not present

## 2023-02-01 LAB — URINALYSIS
Bilirubin, UA: NEGATIVE
Glucose, UA: NEGATIVE mg/dL
Ketones, POC UA: NEGATIVE mg/dL
Leukocytes, UA: NEGATIVE
Nitrite, UA: NEGATIVE
Protein Ur, POC: NEGATIVE mg/dL
Spec Grav, UA: 1.025 (ref 1.010–1.025)
Urobilinogen, UA: 0.2 E.U./dL
pH, UA: 7 (ref 5.0–8.0)

## 2023-02-01 LAB — BLADDER SCAN AMB NON-IMAGING

## 2023-02-01 NOTE — Telephone Encounter (Signed)
Noted sleep center will be informed once PA is approved.

## 2023-02-01 NOTE — Progress Notes (Signed)
Assessment: 1. Lower urinary tract symptoms (LUTS)      Plan: Continue tamsulosin  I had a long discussion today with the patient regarding his lower urinary tract symptoms and specifically talked to him about his utilization of sweet tea as his primary source of fluid intake.  I do not think anything is going to help his urinary frequency unless he is able to transition away from this.  I discussed with him the importance of water intake and suggested that he may even try a water flavor enhancer to make it more palatable for him as he states that he does not like the taste of water.  He is agreeable to make a strong effort in this regard.    Patient will return in approximate 4 months for recheck.  Chief Complaint: LUTS  History of Present Illness:  [name] [name] is a 72 y.o. male who is seen in consultation from Charlott Rakes, MD for evaluation of LUTS. Patient has a past medical history of hypertension, A-fib, sleep apnea, COPD. Uses sildenafil for ED. Patient reports worsening lower urinary tract symptoms over the past several years.  A few months ago he was started on tamsulosin by his PCP.  He has noted significant benefit especially in terms of his force of stream and feeling like he empties his bladder.  He has however had continued bothersome urinary frequency.   Current IPSS = 11 PVR= 15  When I talked to him today regarding his fluid status he admits to drinking on average 8 gallons of sweet tea over the course of a week.  He drinks sweet tea as his primary source of fluid intake throughout the day.  Patient denies prior urologic history.  He had routine PSA testing up to age 37 and his PSAs were actually quite low.  Last PSA 11/2020 = 0.59.  Past Medical History:  Past Medical History:  Diagnosis Date   Anemia    Asthma    Atrial fibrillation (HCC)    CHF (congestive heart failure) (HCC)    COPD (chronic obstructive pulmonary disease) ([place])    Heart disease     Hypertension    Syncope     Past Surgical History:  Past Surgical History:  Procedure Laterality Date   CARDIAC CATHETERIZATION N/A 12/08/2015   Procedure: Left Heart Cath and Coronary Angiography;  Surgeon: Charolette Forward, MD;  Location: [facility];  Service: Cardiovascular;  Laterality: N/A;   ESOPHAGUS SURGERY     SHOULDER CLOSED REDUCTION Left 12/01/2017   Procedure: CLOSED REDUCTION SHOULDER;  Surgeon: Altamese Strasburg, MD;  Location: [facility];  Service: Orthopedics;  Laterality: Left;    Allergies:  No Known Allergies  Family History:  Family History  Problem Relation Age of Onset   Cancer Mother     Social History:  Social History   Tobacco Use   Smoking status: Never   Smokeless tobacco: Never  Vaping Use   Vaping Use: Never used  Substance Use Topics   Alcohol use: No    Alcohol/week: 0.0 standard drinks of alcohol   Drug use: No    Review of symptoms:  Constitutional:  Negative for unexplained weight loss, night sweats, fever, chills ENT:  Negative for nose bleeds, sinus pain, painful swallowing CV:  Negative for chest pain, shortness of breath, exercise intolerance, palpitations, loss of consciousness Resp:  Negative for cough, wheezing, shortness of breath GI:  POSITIVE FOR HEART BURN; negative for nausea, vomiting, diarrhea, bloody stools GU:  Positives noted in HPI; otherwise negative for gross hematuria, dysuria, urinary incontinence Neuro:  Negative for seizures, poor balance, limb weakness, slurred speech Psych:  Negative for lack of energy, depression, anxiety Endocrine:  Negative for polydipsia, polyuria, symptoms of hypoglycemia (dizziness, hunger, sweating) Hematologic:  Negative for anemia, purpura, petechia, prolonged or excessive bleeding, use of anticoagulants  Allergic:  Negative for difficulty breathing or choking as a result of exposure to anything; no shellfish allergy; no allergic response (rash/itch) to materials, foods  Physical  exam: Vitals:   02/01/23 1028  BP: (!) 136/97  Pulse: (!) 58    GENERAL APPEARANCE:  obese BM, NAD HEENT: Atraumatic, Normocephalic ABDOMEN: Soft, non-tender, No Masses.  WHSS   GU:  nl ext gentialia DRE:  nst, prostate difficult to palpate due to body habitus.  Normal apex.

## 2023-02-01 NOTE — Telephone Encounter (Signed)
Copied from [place] [phone]. Topic: Appointment Scheduling - Scheduling Inquiry for Clinic >> Feb 01, 2023 11:14 AM Erskine Squibb wrote: Reason for CRM: Terri with the [facility] states the patients iunsurance requires the providers office to obtain a prior authorization and once received please let her know with a reference number. Please assist patient further by either calling or fax to [phone]

## 2023-02-22 ENCOUNTER — Other Ambulatory Visit: Payer: Self-pay | Admitting: Family Medicine

## 2023-02-22 DIAGNOSIS — I10 Essential (primary) hypertension: Secondary | ICD-10-CM

## 2023-02-22 NOTE — Telephone Encounter (Signed)
Requested Prescriptions  Pending Prescriptions Disp Refills   metoprolol succinate (TOPROL-XL) 25 MG 24 hr tablet [Pharmacy Med Name: METOPROLOL SUCC ER 25 MG TAB] 90 tablet 0    Sig: TAKE 1 TABLET (25 MG TOTAL) BY MOUTH DAILY.     Cardiovascular:  Beta Blockers Failed - 02/22/2023  1:53 AM      Failed - Last BP in normal range    BP Readings from Last 1 Encounters:  02/01/23 (!) 136/97         Passed - Last Heart Rate in normal range    Pulse Readings from Last 1 Encounters:  02/01/23 (!) 58         Passed - Valid encounter within last 6 months    Recent Outpatient Visits           4 weeks ago Screening for colon cancer   [place], [place], MD   7 months ago Prediabetes   [place], Charlane Ferretti, MD   10 months ago Hypertensive heart disease with chronic diastolic congestive heart failure [facility])   [place] Charlott Rakes, MD   1 year ago Syncope, unspecified syncope type   [place], [place], MD   1 year ago Tinnitus of right ear   [place], [place]ob[place], MD       Future Appointments             In 3 months [place] Crane, Berlinda Last, MD [place] [facility]   In 5 months Charlott Rakes, MD [place]

## 2023-02-23 ENCOUNTER — Telehealth: Payer: Self-pay | Admitting: Family Medicine

## 2023-02-23 NOTE — Telephone Encounter (Signed)
Patient returned my call.    Patient wanted to have IN OFFICE AWV appt.  Can you help with this?  Thank you

## 2023-02-23 NOTE — Telephone Encounter (Signed)
Called patient to schedule Medicare Annual Wellness Visit (AWV). Left message for patient to call back and schedule Medicare Annual Wellness Visit (AWV).  Last date of AWV: 08/27/21   If any questions, please contact me at [phone].  Thank you ,  Barkley Boards AWV direct phone # [phone]

## 2023-02-27 NOTE — Telephone Encounter (Signed)
Scheduled in person appt. With PCP.

## 2023-03-13 NOTE — Telephone Encounter (Addendum)
[name] [name] clinical review nurse from [facility] stated she is calling in regards to PA for sleep study. Stated upon checking the patient profile, clinical information is needed. Prior sleep study, if there is one, or sleep evaluation. The last two office notes, lab results, and treatments attempted. Stated this is time sensitive response is needed.  [name] will be sending this information to office via fax as well.   Please advise.

## 2023-03-13 NOTE — Telephone Encounter (Signed)
FYI

## 2023-03-15 ENCOUNTER — Ambulatory Visit: Payer: Medicare HMO | Admitting: Family Medicine

## 2023-03-16 NOTE — Telephone Encounter (Signed)
Form has been received and PCP will complete

## 2023-03-26 ENCOUNTER — Ambulatory Visit: Payer: Medicare HMO | Attending: Family Medicine | Admitting: Family Medicine

## 2023-03-26 ENCOUNTER — Encounter: Payer: Self-pay | Admitting: Family Medicine

## 2023-03-26 VITALS — BP 117/74 | HR 60 | Ht 67.0 in | Wt 228.0 lb

## 2023-03-26 DIAGNOSIS — Z0001 Encounter for general adult medical examination with abnormal findings: Secondary | ICD-10-CM | POA: Diagnosis not present

## 2023-03-26 DIAGNOSIS — H9313 Tinnitus, bilateral: Secondary | ICD-10-CM

## 2023-03-26 DIAGNOSIS — Z0000 Encounter for general adult medical examination without abnormal findings: Secondary | ICD-10-CM

## 2023-03-26 DIAGNOSIS — Z1211 Encounter for screening for malignant neoplasm of colon: Secondary | ICD-10-CM

## 2023-03-26 NOTE — Progress Notes (Signed)
Subjective:   [name] [name] is a 72 y.o. male who presents for Medicare Annual/Subsequent preventive examination. He Complains of persistent tinnitus which we had discussed at his last office visit including the pathophysiology.  He states this is occurring more.  Review of Systems    General: negative for fever, weight loss, appetite change Eyes: no visual symptoms. ENT: +tinnitus, no sinus tenderness, no nasal congestion or sore throat. Neck: no pain  Respiratory: no wheezing, shortness of breath, cough Cardiovascular: no chest pain, no dyspnea on exertion, no pedal edema, no orthopnea. Gastrointestinal: no abdominal pain, no diarrhea, no constipation Genito-Urinary: no urinary frequency, no dysuria, no polyuria. Hematologic: no bruising Endocrine: no cold or heat intolerance Neurological: no headaches, no seizures, no tremors Musculoskeletal: no joint pains, no joint swelling Skin: no pruritus, no rash. Psychological: no depression, no anxiety,       Objective:    Today's Vitals   03/26/23 1002  BP: 117/74  Pulse: 60  SpO2: 99%  Weight: 228 lb (103.4 kg)  Height: 5\' 7"  (1.702 m)   Body mass index is 35.71 kg/m.     03/26/2023   10:07 AM 08/27/2021    9:24 AM 01/20/2019    3:24 AM 10/17/2018   10:40 AM 12/03/2017   11:18 AM 08/17/2017   10:14 AM 05/17/2017   10:30 AM  Advanced Directives  Does Patient Have a Medical Advance Directive? No No No No No No No  Would patient like information on creating a medical advance directive? Yes (ED - Information included in AVS)  No - Patient declined No - Patient declined No - Patient declined      Current Medications (verified) Outpatient Encounter Medications as of 03/26/2023  Medication Sig   acetaminophen (TYLENOL) 325 MG tablet Take 2 tablets (650 mg total) by mouth every 6 (six) hours as needed for mild pain (or Fever >/= 101).   albuterol (PROVENTIL HFA;VENTOLIN HFA) 108 (90 Base) MCG/ACT inhaler Inhale 2 puffs into the  lungs every 6 (six) hours as needed.   albuterol (PROVENTIL) (2.5 MG/3ML) 0.083% nebulizer solution Take 3 mLs (2.5 mg total) by nebulization every 6 (six) hours as needed for wheezing or shortness of breath.   allopurinol (ZYLOPRIM) 300 MG tablet Take 1 tablet (300 mg total) by mouth daily.   amiodarone (PACERONE) 200 MG tablet Take 1 tablet (200 mg total) by mouth 2 (two) times daily.   ARNUITY ELLIPTA 100 MCG/ACT AEPB Inhale 1 puff into the lungs daily.   aspirin EC 81 MG tablet Take 81 mg by mouth daily.   atorvastatin (LIPITOR) 40 MG tablet Take 1 tablet (40 mg total) by mouth daily.   clomiPHENE (CLOMID) 50 MG tablet 1/4 tab daily   clotrimazole-betamethasone (LOTRISONE) cream Apply 1 application topically 2 (two) times daily as needed (FOR RASH).   diclofenac Sodium (VOLTAREN) 1 % GEL Apply 4 g topically 4 (four) times daily.   digoxin (LANOXIN) 0.125 MG tablet Take 1 tablet (0.125 mg total) by mouth daily.   empagliflozin (JARDIANCE) 10 MG TABS tablet Take 1 tablet (10 mg total) by mouth daily before breakfast.   ferrous sulfate 325 (65 FE) MG tablet Take 325 mg by mouth daily with breakfast. Reported on 03/16/2016   furosemide (LASIX) 20 MG tablet Take 1 tablet (20 mg total) by mouth daily.   lamoTRIgine (LAMICTAL) 100 MG tablet Take 1 tablet (100 mg total) by mouth 2 (two) times daily.   losartan (COZAAR) 100 MG tablet Take 100 mg  by mouth daily.   methocarbamol (ROBAXIN) 500 MG tablet TAKE 1 TABLET (500 MG TOTAL) BY MOUTH EVERY 8 (EIGHT) HOURS AS NEEDED FOR MUSCLE SPASM/PAIN   metoprolol succinate (TOPROL-XL) 25 MG 24 hr tablet TAKE 1 TABLET (25 MG TOTAL) BY MOUTH DAILY.   nitroGLYCERIN (NITROSTAT) 0.4 MG SL tablet Place 1 tablet (0.4 mg total) under the tongue every 5 (five) minutes as needed for chest pain.   pantoprazole (PROTONIX) 40 MG tablet Take 1 tablet (40 mg total) by mouth daily.   potassium chloride (K-DUR) 10 MEQ tablet Take 10 mEq by mouth daily.   sildenafil (VIAGRA) 100  MG tablet Take 1 tablet (100 mg total) by mouth daily as needed for erectile dysfunction.   tacrolimus (PROTOPIC) 0.1 % ointment    tamoxifen (NOLVADEX) 10 MG tablet Take 1 tablet (10 mg total) by mouth 2 (two) times daily.   tamsulosin (FLOMAX) 0.4 MG CAPS capsule Take 1 capsule (0.4 mg total) by mouth daily.   No facility-administered encounter medications on file as of 03/26/2023.    Allergies (verified) Patient has no known allergies.   History: Past Medical History:  Diagnosis Date   Anemia    Asthma    Atrial fibrillation    CHF (congestive heart failure)    COPD (chronic obstructive pulmonary disease)    Heart disease    Hypertension    Syncope    Past Surgical History:  Procedure Laterality Date   CARDIAC CATHETERIZATION N/A 12/08/2015   Procedure: Left Heart Cath and Coronary Angiography;  Surgeon: Charolette Forward, MD;  Location: [place] [facility];  Service: Cardiovascular;  Laterality: N/A;   ESOPHAGUS SURGERY     SHOULDER CLOSED REDUCTION Left 12/01/2017   Procedure: CLOSED REDUCTION SHOULDER;  Surgeon: Altamese Allen, MD;  Location: [place];  Service: Orthopedics;  Laterality: Left;   Family History  Problem Relation Age of Onset   Cancer Mother    Social History   Socioeconomic History   Marital status: Single    Spouse name: Not on file   Number of children: 1   Years of education: college   Highest education level: Not on file  Occupational History   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    Employer: [employer]  Tobacco Use   Smoking status: Never   Smokeless tobacco: Never  Vaping Use   Vaping Use: Never used  Substance and Sexual Activity   Alcohol use: No    Alcohol/week: 0.0 standard drinks of alcohol   Drug use: No   Sexual activity: Not Currently  Other Topics Concern   Not on file  Social History Narrative   Patient lives at home alone and he is single.   Patient is semi retired.    Education college.   Right handed.   Caffeine two cokes daily.    Social Determinants of Health   Financial Resource Strain: Low Risk  (03/26/2023)   Overall Financial Resource Strain (CARDIA)    Difficulty of Paying Living Expenses: Not hard at all  Food Insecurity: No Food Insecurity (03/26/2023)   Hunger Vital Sign    Worried About Running Out of Food in the Last Year: Never true    Ran Out of Food in the Last Year: Never true  Transportation Needs: No Transportation Needs (03/26/2023)   PRAPARE - Hydrologist (Medical): No    Lack of Transportation (Non-Medical): No  Physical Activity: Inactive (03/26/2023)   Exercise Vital Sign    Days of  Exercise per Week: 0 days    Minutes of Exercise per Session: 0 min  Stress: No Stress Concern Present (03/26/2023)   [place]    Feeling of Stress : Not at all  Social Connections: Socially Isolated (03/26/2023)   Social Connection and Isolation Panel [NHANES]    Frequency of Communication with Friends and Family: Never    Frequency of Social Gatherings with Friends and Family: Never    Attends Religious Services: 1 to 4 times per year    Active Member of [employer] or Organizations: No    Attends Music therapist: Never    Marital Status: Never married    Tobacco Counseling Counseling given: Not Answered   Clinical Intake:  Pre-visit preparation completed: No  Pain : No/denies pain     Diabetes: No  How often do you need to have someone help you when you read instructions, pamphlets, or other written materials from your doctor or pharmacy?: 1 - Never  Diabetic?No  Interpreter Needed?: No      Activities of Daily Living    03/26/2023   10:07 AM  In your present state of health, do you have any difficulty performing the following activities:  Hearing? 0  Vision? 0  Difficulty concentrating or making decisions? 0  Walking or climbing stairs? 0  Dressing or bathing? 0  Doing errands,  shopping? 0  Preparing Food and eating ? N  Using the Toilet? N  In the past six months, have you accidently leaked urine? N  Do you have problems with loss of bowel control? N  Managing your Medications? N  Managing your Finances? N  Housekeeping or managing your Housekeeping? N    Patient Care Team: Charlott Rakes, MD as PCP - General (Family Medicine)  Indicate any recent Medical Services you may have received from other than [facility] providers in the past year (date may be approximate).     Constitutional: normal appearing, Obese Eyes: PERRLA HEENT: Head is atraumatic, normal sinuses, normal oropharynx, normal appearing tonsils and palate, tympanic membrane is normal bilaterally, no cerumen noted Neck: normal range of motion, no thyromegaly, no JVD Cardiovascular: normal rate and rhythm, normal heart sounds, no murmurs, rub or gallop, no pedal edema Respiratory: Normal breath sounds, clear to auscultation bilaterally, no wheezes, no rales, no rhonchi Abdomen: soft, not tender to palpation, normal bowel sounds, no enlarged organs Musculoskeletal: Full ROM, no tenderness in joints Skin: warm and dry, no lesions. Neurological: alert, oriented x3, cranial nerves I-XII grossly intact , normal motor strength, normal sensation. Psychological: normal mood.  Assessment:   This is a routine wellness examination for [place].  Hearing/Vision screen No results found.  Dietary issues and exercise activities discussed: Current Exercise Habits: The patient does not participate in regular exercise at present, Exercise limited by: None identified   Goals Addressed             This Visit's Progress    Exercise 150 min/wk Moderate Activity         Depression Screen    01/24/2023    2:27 PM 07/25/2022    2:26 PM 04/11/2022    2:39 PM 01/11/2022   11:38 AM 08/27/2021    9:16 AM 07/11/2021   11:20 AM 12/02/2019    2:01 PM  PHQ 2/9 Scores  PHQ - 2 Score 0 0 0 0 0 2 0  PHQ- 9 Score 4 2 4 3   0 4 0  Fall Risk    03/26/2023   10:07 AM 07/25/2022    2:16 PM 04/11/2022    2:39 PM 01/11/2022   11:37 AM 08/27/2021    9:25 AM  Fall Risk   Falls in the past year? 0 0 0 0 0  Number falls in past yr: 0 0 0 0 0  Injury with Fall? 0 0 0 0 0  Risk for fall due to : No Fall Risks    No Fall Risks  Follow up     Falls evaluation completed    FALL RISK PREVENTION PERTAINING TO THE HOME:  Any stairs in or around the home? No  If so, are there any without handrails? No  Home free of loose throw rugs in walkways, pet beds, electrical cords, etc? Yes  Adequate lighting in your home to reduce risk of falls? Yes   ASSISTIVE DEVICES UTILIZED TO PREVENT FALLS:  Life alert? No  Use of a cane, walker or w/c? No  Grab bars in the bathroom? Yes  Shower chair or bench in shower? Yes  Elevated toilet seat or a handicapped toilet? No   TIMED UP AND GO:  Was the test performed? No .  Length of time to ambulate 10 feet: not performed.   Gait slow and steady without use of assistive device  Cognitive Function:    03/26/2023   10:09 AM  MMSE - Mini Mental State Exam  Orientation to time 5  Orientation to Place 5  Registration 3  Attention/ Calculation 5  Recall 3  Language- name 2 objects 2  Language- repeat 1  Language- follow 3 step command 3  Language- read & follow direction 1  Write a sentence 1  Copy design 1  Total score 30        03/26/2023   10:08 AM 08/27/2021    9:29 AM  6CIT Screen  What Year? 0 points 0 points  What month? 0 points   What time? 0 points 0 points  Count back from 20 0 points 0 points  Months in reverse 0 points   Repeat phrase 0 points 0 points  Total Score 0 points     Immunizations Immunization History  Administered Date(s) Administered   Influenza,inj,Quad PF,6+ Mos 12/09/2015, 01/11/2022   PNEUMOCOCCAL CONJUGATE-20 01/11/2022    Screening Tests Health Maintenance  Topic Date Due   COVID-19 Vaccine (1) Never done   COLONOSCOPY (Pts  45-63yrs Insurance coverage will need to be confirmed)  Never done   Zoster Vaccines- Shingrix (1 of 2) 04/24/2023 (Originally 02/26/1999)   INFLUENZA VACCINE  07/26/2023   Medicare Annual Wellness (AWV)  03/25/2024   Pneumonia Vaccine 41+ Years old  Completed   Hepatitis C Screening  Completed   HPV VACCINES  Aged Out   DTaP/Tdap/Td  Discontinued    Health Maintenance  Health Maintenance Due  Topic Date Due   COVID-19 Vaccine (1) Never done   COLONOSCOPY (Pts 45-74yrs Insurance coverage will need to be confirmed)  Never done      Lung Cancer Screening: (Low Dose CT Chest recommended if Age 65-80 years, 30 pack-year currently smoking OR have quit w/in 15years.) does not qualify.     Additional Screening:   Vision Screening: Recommended annual ophthalmology exams for early detection of glaucoma and other disorders of the eye. Is the patient up to date with their annual eye exam?  No  Who is the provider or what is the name of the office in which the  patient attends annual eye exams? Eye lab If pt is not established with a provider, would they like to be referred to a provider to establish care? No .   Dental Screening: Recommended annual dental exams for proper oral hygiene  Community Resource Referral / Chronic Care Management: CRR required this visit?  No   CCM required this visit?  No      Plan:   1. Encounter for Medicare annual wellness exam Counseled on 150 minutes of exercise per week, healthy eating (including decreased daily intake of saturated fats, cholesterol, added sugars, sodium), routine healthcare maintenance.  2. Tinnitus of both ears - Ambulatory referral to ENT  3. Screening for colon cancer - Cologuard   I have personally reviewed and noted the following in the patient's chart:   Medical and social history Use of alcohol, tobacco or illicit drugs  Current medications and supplements including opioid prescriptions. Patient is not currently  taking opioid prescriptions. Functional ability and status Nutritional status Physical activity Advanced directives List of other physicians Hospitalizations, surgeries, and ER visits in previous 12 months Vitals Screenings to include cognitive, depression, and falls Referrals and appointments  In addition, I have reviewed and discussed with patient certain preventive protocols, quality metrics, and best practice recommendations. A written personalized care plan for preventive services as well as general preventive health recommendations were provided to patient.     Charlott Rakes, MD   03/26/2023

## 2023-03-26 NOTE — Progress Notes (Signed)
AWV Still has the ringing in his ears.

## 2023-03-26 NOTE — Patient Instructions (Signed)
  Mr. Yanik , Thank you for taking time to come for your Medicare Wellness Visit. I appreciate your ongoing commitment to your health goals. Please review the following plan we discussed and let me know if I can assist you in the future.   These are the goals we discussed:  Goals      Exercise 150 min/wk Moderate Activity        This is a list of the screening recommended for you and due dates:  Health Maintenance  Topic Date Due   COVID-19 Vaccine (1) Never done   Colon Cancer Screening  Never done   Zoster (Shingles) Vaccine (1 of 2) 04/24/2023*   Flu Shot  07/26/2023   Medicare Annual Wellness Visit  03/25/2024   Pneumonia Vaccine  Completed   Hepatitis C Screening: USPSTF Recommendation to screen - Ages 18-79 yo.  Completed   HPV Vaccine  Aged Out   DTaP/Tdap/Td vaccine  Discontinued  *Topic was postponed. The date shown is not the original due date.

## 2023-04-13 DIAGNOSIS — H9041 Sensorineural hearing loss, unilateral, right ear, with unrestricted hearing on the contralateral side: Secondary | ICD-10-CM | POA: Diagnosis not present

## 2023-04-13 DIAGNOSIS — H9311 Tinnitus, right ear: Secondary | ICD-10-CM | POA: Diagnosis not present

## 2023-04-18 ENCOUNTER — Other Ambulatory Visit: Payer: Self-pay | Admitting: Otolaryngology

## 2023-04-18 ENCOUNTER — Ambulatory Visit: Payer: Medicare HMO | Attending: Family Medicine | Admitting: Pharmacist

## 2023-04-18 ENCOUNTER — Ambulatory Visit (INDEPENDENT_AMBULATORY_CARE_PROVIDER_SITE_OTHER): Payer: Medicare HMO | Admitting: Podiatry

## 2023-04-18 ENCOUNTER — Encounter: Payer: Self-pay | Admitting: Podiatry

## 2023-04-18 DIAGNOSIS — M79675 Pain in left toe(s): Secondary | ICD-10-CM

## 2023-04-18 DIAGNOSIS — I110 Hypertensive heart disease with heart failure: Secondary | ICD-10-CM

## 2023-04-18 DIAGNOSIS — M79674 Pain in right toe(s): Secondary | ICD-10-CM | POA: Diagnosis not present

## 2023-04-18 DIAGNOSIS — H918X3 Other specified hearing loss, bilateral: Secondary | ICD-10-CM

## 2023-04-18 DIAGNOSIS — B351 Tinea unguium: Secondary | ICD-10-CM

## 2023-04-18 DIAGNOSIS — I5032 Chronic diastolic (congestive) heart failure: Secondary | ICD-10-CM

## 2023-04-18 MED ORDER — ATORVASTATIN CALCIUM 40 MG PO TABS: 40.0000 mg | ORAL_TABLET | Freq: Every day | ORAL | 2 refills | Status: DC

## 2023-04-18 NOTE — Progress Notes (Signed)
04/18/2023 Name: [name] [name] MRN: [id number] DOB: June 21, 1951  No chief complaint on file.  [name] [name] is a 72 y.o. year old male who presented for a telephone visit.   They were referred to the pharmacist by a quality report for assistance in managing medication access.   Patient is participating in a Managed Medicaid Plan: no  Subjective:  Care Team: Primary Care Provider: Hoy Register, MD ; Next Scheduled Visit: 07/25/2023  Medication Access/Adherence  Current Pharmacy:  [id number] [place], [place] - [address] [place] [address] Phone: [phone] Fax: [phone]  Patient reports affordability concerns with their medications: No  Patient reports access/transportation concerns to their pharmacy: No  Patient reports adherence concerns with their medications:  No    Medication Management:  Current adherence strategy: fills 90-day supplies  Patient reports Good adherence to medications  Patient reports the following barriers to adherence: none reported  Objective:  Lab Results  Component Value Date   HGBA1C 5.6 07/25/2022    Lab Results  Component Value Date   CREATININE 1.19 01/25/2023   BUN 9 01/25/2023   NA 143 01/25/2023   K 4.1 01/25/2023   CL 104 01/25/2023   CO2 26 01/25/2023    Lab Results  Component Value Date   CHOL 146 10/07/2021   HDL 45 10/07/2021   LDLCALC 85 10/07/2021   TRIG 85 10/07/2021   CHOLHDL 3.2 10/07/2021    Medications Reviewed Today     Reviewed by Helane Gunther, DPM (Physician) on 04/18/23 at 1042  Med List Status: <None>   Medication Order Taking? Sig Documenting Provider Last Dose Status Informant  acetaminophen (TYLENOL) 325 MG tablet [id number] No Take 2 tablets (650 mg total) by mouth every 6 (six) hours as needed for mild pain (or Fever >/= 101). Rai, Delene Ruffini, MD Taking Active Self  albuterol (PROVENTIL HFA;VENTOLIN HFA) 108 (90 Base) MCG/ACT inhaler  [id number] No Inhale 2 puffs into the lungs every 6 (six) hours as needed. Hoy Register, MD Taking Active Self           Med Note Katrinka Blazing, JEFFREY W   Mon Jan 20, 2019  5:06 AM)    albuterol (PROVENTIL) (2.5 MG/3ML) 0.083% nebulizer solution [id number] No Take 3 mLs (2.5 mg total) by nebulization every 6 (six) hours as needed for wheezing or shortness of breath. Hoy Register, MD Taking Active Self  allopurinol (ZYLOPRIM) 300 MG tablet [id number] No Take 1 tablet (300 mg total) by mouth daily. Hoy Register, MD Taking Active   amiodarone (PACERONE) 200 MG tablet [id number] No Take 1 tablet (200 mg total) by mouth 2 (two) times daily. Rai, Delene Ruffini, MD Taking Active Self  ARNUITY ELLIPTA 100 MCG/ACT AEPB [id number] No Inhale 1 puff into the lungs daily. Kalman Shan, MD Taking Active Self  aspirin EC 81 MG tablet [id number] No Take 81 mg by mouth daily. [provider] Taking Active Self  atorvastatin (LIPITOR) 40 MG tablet [id number] No Take 1 tablet (40 mg total) by mouth daily. Hoy Register, MD Taking Active   clomiPHENE (CLOMID) 50 MG tablet [id number] No 1/4 tab daily Romero Belling, MD Taking Active Self  clotrimazole-betamethasone (LOTRISONE) cream [id number] No Apply 1 application topically 2 (two) times daily as needed (FOR RASH). Rai, Delene Ruffini, MD Taking Active Self  diclofenac Sodium (VOLTAREN) 1 % GEL [id number] No Apply 4 g topically 4 (four) times daily. Hoy Register, MD Taking Active   digoxin (LANOXIN) 0.125 MG  tablet [id number] No Take 1 tablet (0.125 mg total) by mouth daily. Rai, Delene Ruffini, MD Taking Active Self  empagliflozin (JARDIANCE) 10 MG TABS tablet [id number] No Take 1 tablet (10 mg total) by mouth daily before breakfast. Hoy Register, MD Taking Active   ferrous sulfate 325 (65 FE) MG tablet [id number] No Take 325 mg by mouth daily with breakfast. Reported on 03/16/2016 [provider] Taking Active Self           Med Note (DAVIS, SOPHIA A   Fri Jul 19, 2018  1:55 PM)    furosemide (LASIX) 20 MG tablet [id number] No Take 1 tablet (20 mg total) by mouth daily. Hoy Register, MD Taking Active   lamoTRIgine (LAMICTAL) 100 MG tablet [id number] No Take 1 tablet (100 mg total) by mouth 2 (two) times daily. Ihor Austin, NP Taking Active   losartan (COZAAR) 100 MG tablet [id number] No Take 100 mg by mouth daily. [provider] Taking Active Self  methocarbamol (ROBAXIN) 500 MG tablet [id number] No TAKE 1 TABLET (500 MG TOTAL) BY MOUTH EVERY 8 (EIGHT) HOURS AS NEEDED FOR MUSCLE SPASM/PAIN Hoy Register, MD Taking Active   metoprolol succinate (TOPROL-XL) 25 MG 24 hr tablet [id number] No TAKE 1 TABLET (25 MG TOTAL) BY MOUTH DAILY. Hoy Register, MD Taking Active   nitroGLYCERIN (NITROSTAT) 0.4 MG SL tablet [id number] No Place 1 tablet (0.4 mg total) under the tongue every 5 (five) minutes as needed for chest pain. Orpah Cobb, MD Taking Active Self  pantoprazole (PROTONIX) 40 MG tablet [id number] No Take 1 tablet (40 mg total) by mouth daily. Hoy Register, MD Taking Active   potassium chloride (K-DUR) 10 MEQ tablet [id number] No Take 10 mEq by mouth daily. [provider] Taking Active Self  sildenafil (VIAGRA) 100 MG tablet [id number] No Take 1 tablet (100 mg total) by mouth daily as needed for erectile dysfunction. Romero Belling, MD Taking Active   tacrolimus (PROTOPIC) 0.1 % ointment [id number] No  [provider] Taking Active   tamoxifen (NOLVADEX) 10 MG tablet [id number] No Take 1 tablet (10 mg total) by mouth 2 (two) times daily. Romero Belling, MD Taking Active   tamsulosin [facility]) 0.4 MG CAPS capsule [id number] No Take 1 capsule (0.4 mg total) by mouth daily. Hoy Register, MD Taking Active              Assessment/Plan:   Medication Management: - Currently strategy sufficient to maintain appropriate adherence to prescribed medication regimen - Suggested use of weekly pill box to organize medications -  Created list of medication, indication, and administration time. Provided to patient   Follow Up Plan: sees his PCP in July.  Butch Penny, PharmD, Patsy [place], CPP Clinical Pharmacist [facility] [phone]

## 2023-04-18 NOTE — Progress Notes (Signed)
This patient presents to the office with chief complaint of long thick painful nails.  Patient says the nails are painful walking and wearing shoes.  This patient is unable to self treat.  This patient is unable to trim his nails since she is unable to reach his nails.  She presents to the office for preventative foot care services.  General Appearance  Alert, conversant and in no acute stress.  Vascular  Dorsalis pedis and posterior tibial  pulses are palpable  bilaterally.  Capillary return is within normal limits  bilaterally. Temperature is within normal limits  bilaterally.  Neurologic  Senn-Weinstein monofilament wire test within normal limits  bilaterally. Muscle power within normal limits bilaterally.  Nails Thick disfigured discolored nails with subungual debris  from hallux to fifth toes bilaterally. No evidence of bacterial infection or drainage bilaterally.  Orthopedic  No limitations of motion  feet .  No crepitus or effusions noted.  No bony pathology or digital deformities noted.  Skin  normotropic skin with no porokeratosis noted bilaterally.  No signs of infections or ulcers noted.     Onychomycosis  Nails  B/L.  Pain in right toes  Pain in left toes  Debridement of nails both feet followed trimming the nails with dremel tool.    RTC 3 months.   Marlynn Hinckley DPM  

## 2023-05-11 ENCOUNTER — Telehealth: Payer: Self-pay

## 2023-05-11 NOTE — Telephone Encounter (Signed)
   Telephone encounter was:  Unsuccessful.  05/11/2023 Name: [name] [name] MRN: [id number] DOB: July 31, 1951  Unsuccessful outbound call made today to assist with:   Colon Cancer Screening  Outreach Attempt:  1st Attempt  A HIPAA compliant voice message was left requesting a return call.  Instructed patient to call back.    Lenard Forth [facility] Guide, [facility] [phone] [address] [place], [place], [place] [address] Phone: [phone] Email: [email or web address]

## 2023-05-14 ENCOUNTER — Telehealth: Payer: Self-pay

## 2023-05-14 NOTE — Telephone Encounter (Signed)
   Telephone encounter was:  Successful.  05/14/2023 Name: [name] [name] MRN: [id number] DOB: 10-May-1951  [name] [name] is a 72 y.o. year old male who is a primary care patient of Hoy Register, MD . The community resource team was consulted for assistance with  Colorectal Cancer Screening  Care guide performed the following interventions: Patient provided with information about care guide support team and interviewed to confirm resource needs.Patient will be following up with Dr Earley Abide  office because he never got the kit in the mail.  Follow Up Plan:  No further follow up planned at this time. The patient has been provided with needed resources.    Lenard Forth [facility] Guide, [facility] [phone] [address] [place], [place], [place] [address] Phone: [phone] Email: [email or web address]

## 2023-05-25 ENCOUNTER — Ambulatory Visit
Admission: RE | Admit: 2023-05-25 | Discharge: 2023-05-25 | Disposition: A | Discharge status: Home / Self Care | Payer: Medicare HMO | Source: Ambulatory Visit | Attending: Otolaryngology | Admitting: Otolaryngology

## 2023-05-25 DIAGNOSIS — H903 Sensorineural hearing loss, bilateral: Secondary | ICD-10-CM | POA: Diagnosis not present

## 2023-05-25 DIAGNOSIS — H918X3 Other specified hearing loss, bilateral: Secondary | ICD-10-CM

## 2023-05-25 MED ORDER — GADOPICLENOL 0.5 MMOL/ML IV SOLN
10.0000 mL | Freq: Once | INTRAVENOUS | Status: AC | PRN
  Administered 2023-05-25: 10 mL via INTRAVENOUS

## 2023-05-27 ENCOUNTER — Other Ambulatory Visit: Payer: Self-pay | Admitting: Family Medicine

## 2023-05-27 DIAGNOSIS — I10 Essential (primary) hypertension: Secondary | ICD-10-CM

## 2023-05-28 NOTE — Telephone Encounter (Signed)
Requested Prescriptions  Pending Prescriptions Disp Refills   metoprolol succinate (TOPROL-XL) 25 MG 24 hr tablet [Pharmacy Med Name: METOPROLOL SUCC ER 25 MG TAB] 90 tablet 0    Sig: TAKE 1 TABLET (25 MG TOTAL) BY MOUTH DAILY.     Cardiovascular:  Beta Blockers Passed - 05/27/2023  1:46 AM      Passed - Last BP in normal range    BP Readings from Last 1 Encounters:  03/26/23 117/74         Passed - Last Heart Rate in normal range    Pulse Readings from Last 1 Encounters:  03/26/23 60         Passed - Valid encounter within last 6 months    Recent Outpatient Visits           1 month ago Hypertensive heart disease with chronic diastolic congestive heart failure [facility])   [place] [facility] [place], Cornelius Moras, RPH-CPP   2 months ago Encounter for [employer] annual wellness exam   [place] [facility] [place], Odette Horns, MD   4 months ago Screening for colon cancer   [place] [facility] Hoy Register, MD   10 months ago Prediabetes   [place] [facility] [place], Odette Horns, MD   1 year ago Hypertensive heart disease with chronic diastolic congestive heart failure [facility])    [facility] Hoy Register, MD       Future Appointments             In 1 week Joline Maxcy, MD [facility]   In 1 month Hoy Register, MD [facility]

## 2023-06-01 DIAGNOSIS — E782 Mixed hyperlipidemia: Secondary | ICD-10-CM | POA: Diagnosis not present

## 2023-06-01 DIAGNOSIS — I10 Essential (primary) hypertension: Secondary | ICD-10-CM | POA: Diagnosis not present

## 2023-06-01 DIAGNOSIS — I2510 Atherosclerotic heart disease of native coronary artery without angina pectoris: Secondary | ICD-10-CM | POA: Diagnosis not present

## 2023-06-01 DIAGNOSIS — R7303 Prediabetes: Secondary | ICD-10-CM | POA: Diagnosis not present

## 2023-06-04 ENCOUNTER — Ambulatory Visit: Payer: Medicare HMO | Admitting: Urology

## 2023-06-05 ENCOUNTER — Ambulatory Visit: Payer: Medicare HMO | Admitting: Urology

## 2023-06-06 ENCOUNTER — Ambulatory Visit: Payer: Medicare HMO | Admitting: Urology

## 2023-06-12 ENCOUNTER — Encounter: Payer: Self-pay | Admitting: Urology

## 2023-06-12 ENCOUNTER — Ambulatory Visit (INDEPENDENT_AMBULATORY_CARE_PROVIDER_SITE_OTHER): Payer: Medicare HMO | Admitting: Urology

## 2023-06-12 VITALS — BP 143/84 | HR 55 | Ht 67.0 in | Wt 235.0 lb

## 2023-06-12 DIAGNOSIS — R399 Unspecified symptoms and signs involving the genitourinary system: Secondary | ICD-10-CM | POA: Diagnosis not present

## 2023-06-12 DIAGNOSIS — N401 Enlarged prostate with lower urinary tract symptoms: Secondary | ICD-10-CM

## 2023-06-12 LAB — MICROSCOPIC EXAMINATION
Cast Type: NONE SEEN
Casts: NONE SEEN /lpf
Crystal Type: NONE SEEN
Crystals: NONE SEEN
Renal Epithel, UA: NONE SEEN /hpf
Trichomonas, UA: NONE SEEN
Yeast, UA: NONE SEEN

## 2023-06-12 LAB — URINALYSIS, ROUTINE W REFLEX MICROSCOPIC
Bilirubin, UA: NEGATIVE
Glucose, UA: NEGATIVE
Ketones, UA: NEGATIVE
Nitrite, UA: NEGATIVE
Protein,UA: NEGATIVE
RBC, UA: NEGATIVE
Specific Gravity, UA: 1.02 (ref 1.005–1.030)
Urobilinogen, Ur: 0.2 mg/dL (ref 0.2–1.0)
pH, UA: 7.5 (ref 5.0–7.5)

## 2023-06-12 NOTE — Progress Notes (Signed)
   Assessment: 1. Lower urinary tract symptoms (LUTS)     Plan: Continue tamsulosin and water as source of primary fluid intake Will continue with yearly follow-up  Chief Complaint: No chief complaint on file.   HPI: [name] [name] is a 72 y.o. male who presents for continued evaluation of BPH/lower urinary tract symptoms. See my note 02/01/2023 at the time of initial visit for detailed history and exam. Also uses sildenafil for ED. When I initially saw the patient I learned that on average he was drinking 8 gallons of sweet tea over the course of the week with sweet tea as his primary source of fluid intake.  I discussed with him the importance of transitioning to water as his source of hydration and to avoid tea and sodas.  Patient was also started earlier this year on tamsulosin by his PCP.  Note is also made that the patient is on Jardiance as part of his diabetes regimen.  Fortunately, patient has done quite well adjusting his fluid intake.  His lower urinary tract symptoms have improved markedly. Current IPSS = 6 (baseline = 11) Baseline PVR = 15   Patient denies prior urologic history.  He had routine PSA testing up to age 55 and his PSAs were actually quite low.  Last PSA 11/2020 = 0.59.  Portions of the above documentation were copied from a prior visit for review purposes only.  Allergies: No Known Allergies  PMH: Past Medical History:  Diagnosis Date   Anemia    Asthma    Atrial fibrillation (HCC)    CHF (congestive heart failure) (HCC)    COPD (chronic obstructive pulmonary disease) (HCC)    Heart disease    Hypertension    Syncope     PSH: Past Surgical History:  Procedure Laterality Date   CARDIAC CATHETERIZATION N/A 12/08/2015   Procedure: Left Heart Cath and Coronary Angiography;  Surgeon: Rinaldo Cloud, MD;  Location: [facility] INVASIVE [facility];  Service: Cardiovascular;  Laterality: N/A;   ESOPHAGUS SURGERY     SHOULDER CLOSED REDUCTION Left 12/01/2017    Procedure: CLOSED REDUCTION SHOULDER;  Surgeon: Myrene Galas, MD;  [facility];  Service: Orthopedics;  Laterality: Left;    SH: Social History   Tobacco Use   Smoking status: Never   Smokeless tobacco: Never  Vaping Use   Vaping Use: Never used  Substance Use Topics   Alcohol use: No    Alcohol/week: 0.0 standard drinks of alcohol   Drug use: No    ROS: Constitutional:  Negative for fever, chills, weight loss CV: Negative for chest pain, previous MI, hypertension Respiratory:  Negative for shortness of breath, wheezing, sleep apnea, frequent cough GI:  Negative for nausea, vomiting, bloody stool, GERD  PE: Vitals:   06/12/23 1048  BP: (!) 143/84  Pulse: (!) 55   General: Well-developed well-nourished-no acute distress   Results: Urinalysis negative except for 1+ leuks on dipstick

## 2023-06-14 ENCOUNTER — Ambulatory Visit: Payer: Medicare HMO | Admitting: Neurology

## 2023-06-14 ENCOUNTER — Telehealth: Payer: Self-pay | Admitting: *Deleted

## 2023-06-14 NOTE — Telephone Encounter (Signed)
I called pt.  Relayed that appt made for him today with Dr. Frances Furbish would need to be cancelled due to she had seen him for sleep but back in 2018.  He is not on cpap.  He said appt was for tremor.  (From last note 11-2022 from JM/NP).  He would need to see Dr. Terrace Arabia for this.  Made appt 09-18-2023 at 1000 with Dr. Terrace Arabia for tremor. Placed on waitlist.   He verbalized understanding.  I relayed to him as well we are glad to see him for OSA but would need new referral from pcp.  Pcp had ordered sleep study back in 12-2022, PA was being done 02/2023, but there are no other notes.  He will follow up with them about this.  He appreciated call.

## 2023-07-23 ENCOUNTER — Ambulatory Visit: Payer: Medicare HMO | Admitting: Podiatry

## 2023-07-23 ENCOUNTER — Encounter: Payer: Self-pay | Admitting: Podiatry

## 2023-07-23 VITALS — BP 149/81 | HR 60

## 2023-07-23 DIAGNOSIS — M79674 Pain in right toe(s): Secondary | ICD-10-CM

## 2023-07-23 DIAGNOSIS — M79675 Pain in left toe(s): Secondary | ICD-10-CM

## 2023-07-23 DIAGNOSIS — B351 Tinea unguium: Secondary | ICD-10-CM

## 2023-07-23 NOTE — Progress Notes (Signed)
This patient presents to the office with chief complaint of long thick painful nails.  Patient says the nails are painful walking and wearing shoes.  This patient is unable to self treat.  This patient is unable to trim his nails since she is unable to reach his nails.  She presents to the office for preventative foot care services.  General Appearance  Alert, conversant and in no acute stress.  Vascular  Dorsalis pedis and posterior tibial  pulses are palpable  bilaterally.  Capillary return is within normal limits  bilaterally. Temperature is within normal limits  bilaterally.  Neurologic  Senn-Weinstein monofilament wire test within normal limits  bilaterally. Muscle power within normal limits bilaterally.  Nails Thick disfigured discolored nails with subungual debris  from hallux to fifth toes bilaterally. No evidence of bacterial infection or drainage bilaterally.  Orthopedic  No limitations of motion  feet .  No crepitus or effusions noted.  No bony pathology or digital deformities noted.  Skin  normotropic skin with no porokeratosis noted bilaterally.  No signs of infections or ulcers noted.     Onychomycosis  Nails  B/L.  Pain in right toes  Pain in left toes  Debridement of nails both feet followed trimming the nails with dremel tool.    RTC 3 months.   Gregory Mayer DPM   

## 2023-07-24 DIAGNOSIS — H903 Sensorineural hearing loss, bilateral: Secondary | ICD-10-CM | POA: Diagnosis not present

## 2023-07-24 DIAGNOSIS — H9311 Tinnitus, right ear: Secondary | ICD-10-CM | POA: Diagnosis not present

## 2023-07-25 ENCOUNTER — Encounter: Payer: Self-pay | Admitting: Family Medicine

## 2023-07-25 ENCOUNTER — Ambulatory Visit: Payer: Medicare HMO | Attending: Family Medicine | Admitting: Family Medicine

## 2023-07-25 VITALS — BP 125/76 | HR 65 | Ht 67.0 in | Wt 229.0 lb

## 2023-07-25 DIAGNOSIS — R251 Tremor, unspecified: Secondary | ICD-10-CM | POA: Diagnosis not present

## 2023-07-25 DIAGNOSIS — I110 Hypertensive heart disease with heart failure: Secondary | ICD-10-CM

## 2023-07-25 DIAGNOSIS — K219 Gastro-esophageal reflux disease without esophagitis: Secondary | ICD-10-CM

## 2023-07-25 DIAGNOSIS — H9313 Tinnitus, bilateral: Secondary | ICD-10-CM

## 2023-07-25 DIAGNOSIS — I5032 Chronic diastolic (congestive) heart failure: Secondary | ICD-10-CM | POA: Diagnosis not present

## 2023-07-25 DIAGNOSIS — R569 Unspecified convulsions: Secondary | ICD-10-CM

## 2023-07-25 DIAGNOSIS — I10 Essential (primary) hypertension: Secondary | ICD-10-CM | POA: Diagnosis not present

## 2023-07-25 DIAGNOSIS — R399 Unspecified symptoms and signs involving the genitourinary system: Secondary | ICD-10-CM

## 2023-07-25 DIAGNOSIS — M1A0710 Idiopathic chronic gout, right ankle and foot, without tophus (tophi): Secondary | ICD-10-CM | POA: Diagnosis not present

## 2023-07-25 MED ORDER — EMPAGLIFLOZIN 10 MG PO TABS
10.0000 mg | ORAL_TABLET | Freq: Every day | ORAL | 1 refills | Status: DC
Start: 2023-07-25 — End: 2023-08-22

## 2023-07-25 MED ORDER — FUROSEMIDE 20 MG PO TABS
20.0000 mg | ORAL_TABLET | Freq: Every day | ORAL | 1 refills | Status: DC
Start: 2023-07-25 — End: 2024-01-21

## 2023-07-25 MED ORDER — ATORVASTATIN CALCIUM 40 MG PO TABS: 40.0000 mg | ORAL_TABLET | Freq: Every day | ORAL | 1 refills | Status: DC

## 2023-07-25 MED ORDER — PANTOPRAZOLE SODIUM 40 MG PO TBEC
40.0000 mg | DELAYED_RELEASE_TABLET | Freq: Every day | ORAL | 1 refills | Status: DC
Start: 2023-07-25 — End: 2024-01-28

## 2023-07-25 MED ORDER — TAMSULOSIN HCL 0.4 MG PO CAPS
0.4000 mg | ORAL_CAPSULE | Freq: Every day | ORAL | 1 refills | Status: DC
Start: 2023-07-25 — End: 2024-01-30

## 2023-07-25 MED ORDER — ALLOPURINOL 300 MG PO TABS: 300.0000 mg | ORAL_TABLET | Freq: Every day | ORAL | 1 refills | Status: DC

## 2023-07-25 MED ORDER — METOPROLOL SUCCINATE ER 25 MG PO TB24
25.0000 mg | ORAL_TABLET | Freq: Every day | ORAL | 1 refills | Status: DC
Start: 2023-07-25 — End: 2024-01-06

## 2023-07-25 NOTE — Patient Instructions (Signed)
Tinnitus Tinnitus is when you hear a sound that there's no actual source for. It may sound like ringing in your ears or something else. The sound may be loud, soft, or somewhere in between. It can last for a few seconds or be constant for days. It can come and go. Almost everyone has tinnitus at some point. It's not the same as hearing loss. But you may need to see a health care provider if: It lasts for a long time. It comes back often. You have trouble sleeping and focusing. What are the causes? The cause of tinnitus is often unknown. In some cases, you may get it if: You're around loud noises, such as from machines or music. An object gets stuck in your ear. Earwax builds up in [employer]. You drink a lot of alcohol or caffeine. You take certain medicines. You start to lose your hearing. You may also get it from some medical conditions. These may include: Ear or sinus infections. Heart diseases. High blood pressure. Allergies. Mnire's disease. Problems with your thyroid. A tumor. This is a growth of cells that isn't normal. A weak, bulging blood vessel called an aneurysm near your ear. What increases the risk? You may be more likely to get tinnitus if: You're around loud noises a lot. You're older. You drink alcohol. You smoke. What are the signs or symptoms? The main symptom is hearing a sound that there's no source for. It may sound like ringing. It may also sound like: Buzzing. Sizzling. Blowing air. Hissing. Whistling. Other sounds may include: Roaring. Running water. A musical note. Tapping. Humming. You may have symptoms in one ear or both ears. How is this diagnosed? Tinnitus is diagnosed based on your symptoms, your medical history, and an exam. Your provider may do a full hearing test if your tinnitus: Is in just one ear. Makes it hard for you to hear. Lasts 6 months or longer. You may also need to see an expert in hearing disorders called an audiologist.  They may ask you about your symptoms and how tinnitus affects your daily life. You may have other tests done. These may include: A CT scan. An MRI. An angiogram. This shows how blood flows through your blood vessels. How is this treated? Treatment may include: Therapy to help you manage the stress of living with tinnitus. Finding ways to mask or cover the sound of tinnitus. These include: Sound or white noise machines. Devices that fit in your ear and play sounds or music. A loud humidifier. Acoustic neural stimulation. This is when you use headphones to listen to music that has a special signal in it. Over time, this signal may change some of the pathways in your brain. This can make you less sensitive to tinnitus. This treatment is used for very severe cases. Using hearing aids or cochlear implants if your tinnitus is from hearing loss. If your tinnitus is caused by a medical condition, treating the condition may make it go away.  Follow these instructions at home: Managing symptoms     Try to avoid being in loud places or around loud noises. Wear earplugs or headphones when you're around loud noises. Find ways to reduce stress. These may include meditation, yoga, or deep breathing. Sleep with your head slightly raised. General instructions Take over-the-counter and prescription medicines only as told by your provider. Track the things that cause symptoms (triggers). Try to avoid these things. To stop your tinnitus from getting worse: Do not drink alcohol. Do  not have caffeine. Do not use any products that contain nicotine or tobacco. These products include cigarettes, chewing tobacco, and vaping devices, such as e-cigarettes. If you need help quitting, ask your provider. Avoid using too much salt. Get enough sleep each night. Where to find more information American Tinnitus Association: https://www.johnson-hamilton.org/ Contact a health care provider if: Your symptoms last for 3 weeks or longer without  stopping. You have sudden hearing loss. Your symptoms get worse or don't get better with home care. You can't manage the stress of living with tinnitus. Get help right away if: You get tinnitus after a head injury. You have tinnitus and: Dizziness. Nausea and vomiting. Loss of balance. A sudden, severe headache. Changes to your eyesight. Weakness in your face, arms, or legs. These symptoms may be an emergency. Get help right away. Call 911. Do not wait to see if the symptoms will go away. Do not drive yourself to the hospital. This information is not intended to replace advice given to you by your health care provider. Make sure you discuss any questions you have with your health care provider. Document Revised: 03/19/2023 Document Reviewed: 03/19/2023 Elsevier Patient Education  2024 [employer].

## 2023-07-25 NOTE — Progress Notes (Signed)
Subjective:  Patient ID: [name] [name], male    DOB: [birth year]  Age: 72 y.o. MRN: [id number]  CC: Medical Management of Chronic Issues (Still has ringing in ears.)   HPI [name] [name] is a 72 y.o. year old male with a history of paroxysmal A. fib, atrial flutter, diastolic congestive heart failure (EF 55-60% in 11/2017), CAD (status post cardiac cath in 11/2015 - mid LAD 20% stenosis, dist LAD 40% stenosis, Ost RCA to prox RCA 30% stenosis, normal LV systolic function), sick sinus syndrome, COPD, GERD, seizures (previously managed by [facility]), BPH, hypogonadism (managed by endocrine),Gout here for a follow up visit.   Interval History: Discussed the use of AI scribe software for clinical note transcription with the patient, who gave verbal consent to proceed.  He  presents with persistent ringing in the right ear. He recently consulted an ENT specialist who attributed the symptom to 'decreased intake' in the right ear compared to the left. The patient was advised that hearing aids could alleviate the symptom, but he is not interested in this option at this time. He also reports a sensation of tightness between the eyes, which he was concerned could be a sign of an impending stroke. However, he denies any weakness, gait disturbances, facial drooping, or speech problems.  The patient also reports occasional shortness of breath when walking to the mailbox, his primary form of exercise, but denies dyspnea at rest. He is regularly followed by a cardiologist for his sick sinus syndrome and has not experienced any episodes of syncope since 2018.  The patient is on atorvastatin for cholesterol management and Flomax for BPH. He also takes medication for acid reflux. He previously saw Endocrine, Dr Everardo All who prescribed Clomid for hypogonadism, but he has not been taking this medication since the doctor retired.  The patient also reports occasional involuntary jerking movements, which can occur without  warning and are not associated with any specific position or activity. He also notes that his right hand sometimes shakes uncontrollably, which can interfere with tasks such as adding salt to food.  He has a known history of tremors and is currently under neurology care for management of his seizures.  He is on Lamictal for this.       Past Medical History:  Diagnosis Date   Anemia    Asthma    Atrial fibrillation (HCC)    CHF (congestive heart failure) (HCC)    COPD (chronic obstructive pulmonary disease) (HCC)    Heart disease    Hypertension    Syncope     Past Surgical History:  Procedure Laterality Date   CARDIAC CATHETERIZATION N/A 12/08/2015   Procedure: Left Heart Cath and Coronary Angiography;  Surgeon: Rinaldo Cloud, MD;  Location: [facility];  Service: Cardiovascular;  Laterality: N/A;   ESOPHAGUS SURGERY     SHOULDER CLOSED REDUCTION Left 12/01/2017   Procedure: CLOSED REDUCTION SHOULDER;  Surgeon: Myrene Galas, MD;  Location: [facility];  Service: Orthopedics;  Laterality: Left;    Family History  Problem Relation Age of Onset   Cancer Mother     Social History   Socioeconomic History   Marital status: Single    Spouse name: Not on file   Number of children: 1   Years of education: college   Highest education level: Not on file  Occupational History   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    Employer: [employer]  Tobacco Use   Smoking status: Never   Smokeless tobacco:  Never  Vaping Use   Vaping status: Never Used  Substance and Sexual Activity   Alcohol use: No    Alcohol/week: 0.0 standard drinks of alcohol   Drug use: No   Sexual activity: Not Currently  Other Topics Concern   Not on file  Social History Narrative   Patient lives at home alone and he is single.   Patient is semi retired.    Education college.   Right handed.   Caffeine two cokes daily.   Social Determinants of Health   Financial Resource Strain: Low Risk  (03/26/2023)   Overall  Financial Resource Strain (CARDIA)    Difficulty of Paying Living Expenses: Not hard at all  Food Insecurity: Low Risk  (07/24/2023)   Received from [facility]   Food vital sign    Within the past 12 months, you worried that your food would run out before you got money to buy more: Never true    Within the past 12 months, the food you bought just didn't last and you didn't have money to get more. : Never true  Transportation Needs: Not on file (07/24/2023)  Physical Activity: Inactive (03/26/2023)   Exercise Vital Sign    Days of Exercise per Week: 0 days    Minutes of Exercise per Session: 0 min  Stress: No Stress Concern Present (03/26/2023)   [employer] of Occupational Health - Occupational Stress Questionnaire    Feeling of Stress : Not at all  Social Connections: Socially Isolated (03/26/2023)   Social Connection and Isolation Panel [NHANES]    Frequency of Communication with Friends and Family: Never    Frequency of Social Gatherings with Friends and Family: Never    Attends Religious Services: 1 to 4 times per year    Active Member of [employer] or Organizations: No    Attends Banker Meetings: Never    Marital Status: Never married    No Known Allergies  Outpatient Medications Prior to Visit  Medication Sig Dispense Refill   acetaminophen (TYLENOL) 325 MG tablet Take 2 tablets (650 mg total) by mouth every 6 (six) hours as needed for mild pain (or Fever >/= 101). 30 tablet 0   albuterol (PROVENTIL HFA;VENTOLIN HFA) 108 (90 Base) MCG/ACT inhaler Inhale 2 puffs into the lungs every 6 (six) hours as needed. 1 Inhaler 5   albuterol (PROVENTIL) (2.5 MG/3ML) 0.083% nebulizer solution Take 3 mLs (2.5 mg total) by nebulization every 6 (six) hours as needed for wheezing or shortness of breath. 150 mL 1   amiodarone (PACERONE) 200 MG tablet Take 1 tablet (200 mg total) by mouth 2 (two) times daily. 60 tablet 2   ARNUITY ELLIPTA 100 MCG/ACT AEPB Inhale 1 puff into the lungs  daily. 30 each 3   aspirin EC 81 MG tablet Take 81 mg by mouth daily.     clotrimazole-betamethasone (LOTRISONE) cream Apply 1 application topically 2 (two) times daily as needed (FOR RASH).     diclofenac Sodium (VOLTAREN) 1 % GEL Apply 4 g topically 4 (four) times daily. 100 g 1   digoxin (LANOXIN) 0.125 MG tablet Take 1 tablet (0.125 mg total) by mouth daily. 30 tablet 3   ferrous sulfate 325 (65 FE) MG tablet Take 325 mg by mouth daily with breakfast. Reported on 03/16/2016     lamoTRIgine (LAMICTAL) 100 MG tablet Take 1 tablet (100 mg total) by mouth 2 (two) times daily. 180 tablet 3   losartan (COZAAR) 100 MG tablet Take 100  mg by mouth daily.  3   nitroGLYCERIN (NITROSTAT) 0.4 MG SL tablet Place 1 tablet (0.4 mg total) under the tongue every 5 (five) minutes as needed for chest pain. 25 tablet 1   potassium chloride (K-DUR) 10 MEQ tablet Take 10 mEq by mouth daily.     tacrolimus (PROTOPIC) 0.1 % ointment      allopurinol (ZYLOPRIM) 300 MG tablet Take 1 tablet (300 mg total) by mouth daily. 90 tablet 1   atorvastatin (LIPITOR) 40 MG tablet Take 1 tablet (40 mg total) by mouth daily. 90 tablet 2   clomiPHENE (CLOMID) 50 MG tablet 1/4 tab daily     empagliflozin (JARDIANCE) 10 MG TABS tablet Take 1 tablet (10 mg total) by mouth daily before breakfast. 90 tablet 1   furosemide (LASIX) 20 MG tablet Take 1 tablet (20 mg total) by mouth daily. 90 tablet 1   methocarbamol (ROBAXIN) 500 MG tablet TAKE 1 TABLET (500 MG TOTAL) BY MOUTH EVERY 8 (EIGHT) HOURS AS NEEDED FOR MUSCLE SPASM/PAIN 90 tablet 0   metoprolol succinate (TOPROL-XL) 25 MG 24 hr tablet TAKE 1 TABLET (25 MG TOTAL) BY MOUTH DAILY. 90 tablet 0   pantoprazole (PROTONIX) 40 MG tablet Take 1 tablet (40 mg total) by mouth daily. 90 tablet 1   sildenafil (VIAGRA) 100 MG tablet Take 1 tablet (100 mg total) by mouth daily as needed for erectile dysfunction. 10 tablet 11   tamoxifen (NOLVADEX) 10 MG tablet Take 1 tablet (10 mg total) by mouth  2 (two) times daily. 180 tablet 3   tamsulosin (FLOMAX) 0.4 MG CAPS capsule Take 1 capsule (0.4 mg total) by mouth daily. 90 capsule 1   No facility-administered medications prior to visit.     ROS Review of Systems  Constitutional:  Negative for activity change and appetite change.  HENT:  Positive for tinnitus. Negative for sinus pressure and sore throat.   Respiratory:  Negative for chest tightness, shortness of breath and wheezing.   Cardiovascular:  Negative for chest pain and palpitations.  Gastrointestinal:  Negative for abdominal distention, abdominal pain and constipation.  Genitourinary: Negative.   Musculoskeletal: Negative.   Neurological:  Positive for tremors.  Psychiatric/Behavioral:  Negative for behavioral problems and dysphoric mood.     Objective:  BP 125/76   Pulse 65   Ht 5\' 7"  (1.702 m)   Wt 229 lb (103.9 kg)   SpO2 98%   BMI 35.87 kg/m      07/25/2023    2:14 PM 07/23/2023   10:30 AM 06/12/2023   10:48 AM  BP/Weight  Systolic BP 125 149 143  Diastolic BP 76 81 84  Wt. (Lbs) 229  235  BMI 35.87 kg/m2  36.81 kg/m2      Physical Exam Constitutional:      Appearance: He is well-developed.  Cardiovascular:     Rate and Rhythm: Normal rate.     Heart sounds: Normal heart sounds. No murmur heard. Pulmonary:     Effort: Pulmonary effort is normal.     Breath sounds: Normal breath sounds. No wheezing or rales.  Chest:     Chest wall: No tenderness.  Abdominal:     General: Bowel sounds are normal. There is no distension.     Palpations: Abdomen is soft. There is no mass.     Tenderness: There is no abdominal tenderness.  Musculoskeletal:        General: Normal range of motion.     Right lower leg: No edema.  Left lower leg: No edema.  Neurological:     Mental Status: He is alert and oriented to person, place, and time.     Cranial Nerves: No cranial nerve deficit.     Motor: No weakness.     Coordination: Coordination normal.     Gait:  Gait normal.     Deep Tendon Reflexes: Reflexes normal.  Psychiatric:        Mood and Affect: Mood normal.        Latest Ref Rng & Units 01/25/2023   12:02 PM 07/25/2022    3:01 PM 03/08/2022   12:15 PM  CMP  Glucose 70 - 99 mg/dL 81  76  88   BUN 8 - 27 mg/dL 9  14  11    Creatinine 0.76 - 1.27 mg/dL 1.61  0.96  0.45   Sodium 134 - 144 mmol/L 143  142  140   Potassium 3.5 - 5.2 mmol/L 4.1  3.8  4.0   Chloride 96 - 106 mmol/L 104  103  102   CO2 20 - 29 mmol/L 26  27  27    Calcium 8.6 - 10.2 mg/dL 9.3  9.5  9.0   Total Protein 6.0 - 8.5 g/dL 7.8  7.6  7.6   Total Bilirubin 0.0 - 1.2 mg/dL 0.2  <4.0  0.4   Alkaline Phos 44 - 121 IU/L 113  93  102   AST 0 - 40 IU/L 21  20  24    ALT 0 - 44 IU/L 14  10  14      Lipid Panel     Component Value Date/Time   CHOL 146 10/07/2021 1017   TRIG 85 10/07/2021 1017   HDL 45 10/07/2021 1017   CHOLHDL 3.2 10/07/2021 1017   CHOLHDL 2.5 12/09/2015 0133   VLDL 11 12/09/2015 0133   LDLCALC 85 10/07/2021 1017    CBC    Component Value Date/Time   WBC 7.8 03/08/2022 1215   WBC 8.3 01/20/2019 0345   RBC 5.33 03/08/2022 1215   RBC 5.22 01/20/2019 0345   HGB 11.5 (L) 03/08/2022 1215   HCT 37.6 03/08/2022 1215   PLT 357 03/08/2022 1215   MCV 71 (L) 03/08/2022 1215   MCH 21.6 (L) 03/08/2022 1215   MCH 21.1 (L) 01/20/2019 0345   MCHC 30.6 (L) 03/08/2022 1215   MCHC 29.5 (L) 01/20/2019 0345   RDW 16.0 (H) 03/08/2022 1215   LYMPHSABS 2.4 03/08/2022 1215   MONOABS 1.2 (H) 12/01/2017 2242   EOSABS 0.6 (H) 03/08/2022 1215   BASOSABS 0.1 03/08/2022 1215    Lab Results  Component Value Date   HGBA1C 5.6 07/25/2022    Assessment & Plan:      Tinnitus Predominantly in the right ear. -Recently evaluated by ENT -Per patient no current plan for hearing aids. -Continue monitoring symptoms. -Ear buds to reduce symptoms Patient expressed concern about potential stroke due to tinnitus and tightness between eyes. No weakness, gait  disturbances, facial drooping, or speech problems noted. -Reassured patient that tinnitus is not typically associated with stroke.  Hypertensive heart disease with chronic diastolic heart failure. EF 55 to 60% from echo of 2018 -Occurs with walking to the mailbox, but patient is not short of breath at rest. -He is on an SGLT2i, ARB per cardiology -Continue monitoring symptoms and regular follow-up with cardiologist Dr. Sharyn Lull.  Hyperlipidemia On Atorvastatin, cholesterol last checked in 2022. -Plan to check cholesterol levels at next blood draw. -He will be going undergoing  labs ordered by his cardiologist -Low-cholesterol diet  Benign Prostatic Hyperplasia On Flomax. -Refill Flomax prescription.  Gastroesophageal Reflux Disease -Controlled on medication. -Continue current medication.  Seizures -No recent seizures History of passing out episodes, last occurred in 2018. Recent report of occasional right hand shaking/tremor -Continue regular follow-up with neurologist. Discuss new symptom of hand shaking at next appointment. -Continue Lamictal  Lower urinary tract symptoms -Controlled -Continue Flomax  Gout -Stable           Meds ordered this encounter  Medications   allopurinol (ZYLOPRIM) 300 MG tablet    Sig: Take 1 tablet (300 mg total) by mouth daily.    Dispense:  90 tablet    Refill:  1   atorvastatin (LIPITOR) 40 MG tablet    Sig: Take 1 tablet (40 mg total) by mouth daily.    Dispense:  90 tablet    Refill:  1   empagliflozin (JARDIANCE) 10 MG TABS tablet    Sig: Take 1 tablet (10 mg total) by mouth daily before breakfast.    Dispense:  90 tablet    Refill:  1   furosemide (LASIX) 20 MG tablet    Sig: Take 1 tablet (20 mg total) by mouth daily.    Dispense:  90 tablet    Refill:  1   metoprolol succinate (TOPROL-XL) 25 MG 24 hr tablet    Sig: Take 1 tablet (25 mg total) by mouth daily.    Dispense:  90 tablet    Refill:  1   pantoprazole  (PROTONIX) 40 MG tablet    Sig: Take 1 tablet (40 mg total) by mouth daily.    Dispense:  90 tablet    Refill:  1   tamsulosin (FLOMAX) 0.4 MG CAPS capsule    Sig: Take 1 capsule (0.4 mg total) by mouth daily.    Dispense:  90 capsule    Refill:  1    Follow-up: Return in about 6 months (around 01/25/2024).       Hoy Register, MD, FAAFP. [facility] [place], [place] [phone]   07/25/2023, 4:57 PM

## 2023-07-31 DIAGNOSIS — L304 Erythema intertrigo: Secondary | ICD-10-CM | POA: Diagnosis not present

## 2023-08-02 DIAGNOSIS — E785 Hyperlipidemia, unspecified: Secondary | ICD-10-CM | POA: Diagnosis not present

## 2023-08-02 DIAGNOSIS — R7303 Prediabetes: Secondary | ICD-10-CM | POA: Diagnosis not present

## 2023-08-02 DIAGNOSIS — I10 Essential (primary) hypertension: Secondary | ICD-10-CM | POA: Diagnosis not present

## 2023-08-22 ENCOUNTER — Other Ambulatory Visit: Payer: Self-pay | Admitting: Pharmacist

## 2023-08-22 DIAGNOSIS — I110 Hypertensive heart disease with heart failure: Secondary | ICD-10-CM

## 2023-08-22 MED ORDER — EMPAGLIFLOZIN 10 MG PO TABS
10.0000 mg | ORAL_TABLET | Freq: Every day | ORAL | 1 refills | Status: DC
Start: 2023-08-22 — End: 2024-01-30

## 2023-08-22 NOTE — Progress Notes (Signed)
Patient ID: [name] [name], male   DOB: Dec 09, 1951, 72 y.o.   MRN: [id number]  Pharmacy Quality Measure Review  This patient is appearing on a report for being at risk of failing the adherence measure for diabetes medications this calendar year.   Medication: [place] Pepper Last fill date: 07/14/2023 for 90 day supply  Insurance report was not up to date. No action needed at this time.   Butch Penny, PharmD, P[place]y [place], CPP Clinical Pharmacist [facility] [phone]

## 2023-08-28 DIAGNOSIS — L304 Erythema intertrigo: Secondary | ICD-10-CM | POA: Diagnosis not present

## 2023-08-29 DIAGNOSIS — R7303 Prediabetes: Secondary | ICD-10-CM | POA: Diagnosis not present

## 2023-08-29 DIAGNOSIS — E782 Mixed hyperlipidemia: Secondary | ICD-10-CM | POA: Diagnosis not present

## 2023-08-29 DIAGNOSIS — I10 Essential (primary) hypertension: Secondary | ICD-10-CM | POA: Diagnosis not present

## 2023-08-29 DIAGNOSIS — I2510 Atherosclerotic heart disease of native coronary artery without angina pectoris: Secondary | ICD-10-CM | POA: Diagnosis not present

## 2023-09-18 ENCOUNTER — Ambulatory Visit (INDEPENDENT_AMBULATORY_CARE_PROVIDER_SITE_OTHER): Payer: Medicare HMO | Admitting: Neurology

## 2023-09-18 ENCOUNTER — Encounter: Payer: Self-pay | Admitting: Neurology

## 2023-09-18 VITALS — BP 153/78 | HR 64 | Ht 67.0 in | Wt 234.5 lb

## 2023-09-18 DIAGNOSIS — R404 Transient alteration of awareness: Secondary | ICD-10-CM

## 2023-09-18 DIAGNOSIS — R251 Tremor, unspecified: Secondary | ICD-10-CM

## 2023-09-18 NOTE — Progress Notes (Signed)
[facility] [address] [place]. [place] [address] [phone]   ASSESSMENT/PLAN:  [name] [name] is a 72 y.o. year old male   Recurrent episode of sudden passing out, Intermittent RUE tremor with no change of consciousness,  No longer have passing out episode  MRI of the brain with and without contrast was normal  Reported family history of seizure,  But current spells are not typical for partial seizure, long-lasting, frequent occurrence, couple times each week despite taking lamotrigine 100 mg twice a day  Repeat EEG, if negative, proceed with 72 hours prolonged video EEG after taper off lamotrigine  Data and image Review:  Personally reviewed imaging findings, lab result, referring notes Lab in 2024 normal TSH, B12, folic acid, CMP  Medical History: [name] [name] is a 72 year old male, seen in request by his primary care physician Dr. Hoy Register, MD for evaluation of passing out episode,   I reviewed and summarized the referring note.PMHX. Atrial fibrillation, taking amiodarone digoxin Hypertension Hyperlipidemia   He reported 1 episode in December 2022, he woke up from sleep using bathroom to urinate, he could remember he finish urination, then woke up on the back, apparently he fell to his right side into the adjacent bathtub, With the shower curtain all over him,   He is sleep with TV on all night, based on the program, he thought he was out for about 10 minutes  There was no self injury, he was able to get out of the bathtub, no tongue biting, no incontinence  He lives by himself in an apartment,  does community service.  He wakes up frequently during middle of the night, also complains of excessive daytime fatigue, sleepiness, taking frequent cat naps,   I saw him previously in 2018 for passing out spells  At that time he complains of intermittent episode of sudden onset right arm shaking, lasting for few minutes, no loss of consciousness,  not generalized to left side, no right facial neck involvement,  He does not have a family history of epilepsy, brother and father had a diagnosis of seizure presented with similar event  At that time, extensive laboratory evaluation including MRI of the brain, EEG showed no significant abnormality. Laboratory evaluations showed no significant abnormality as well, I have suggested him empirically treated with Keppra, he is hesitate to stop the medications, loss of follow-up  Over the past few years, he reported different spells, he often have sudden onset mild cricket ringing sound in his ear, lasting for few minutes, to the point that he called the past control, worry about the cricket in his apartment, but the reported cricket sound was not heard by other people, the recurrent episode happened about every 2 to 3 months, no loss of consciousness  UPDATE Sept 24 2024: He remains active, no longer have passing out episode, but still have episode of sudden onset right arm shaking, couple times each week, despite taking lamotrigine 100 mg twice a day, seems does not make much of a difference, he denies loss of consciousness during the spells, no right leg involvement, right arm shaking can last more than 10 minutes,  MRI of the brain with without contrast in May 2024 showed mild small vessel disease  ROS:   14 system review of systems performed and negative with exception of those listed in HPI  PMH:  Past Medical History:  Diagnosis Date   Anemia    Asthma    Atrial fibrillation (HCC)  CHF (congestive heart failure) (HCC)    COPD (chronic obstructive pulmonary disease) (HCC)    Heart disease    Hypertension    Syncope     PSH:  Past Surgical History:  Procedure Laterality Date   CARDIAC CATHETERIZATION N/A 12/08/2015   Procedure: Left Heart Cath and Coronary Angiography;  Surgeon: Rinaldo Cloud, MD;  Location: [facility] INVASIVE [facility];  Service: Cardiovascular;  Laterality: N/A;    ESOPHAGUS SURGERY     SHOULDER CLOSED REDUCTION Left 12/01/2017   Procedure: CLOSED REDUCTION SHOULDER;  Surgeon: Myrene Galas, MD;  Location: [facility] OR;  Service: Orthopedics;  Laterality: Left;    Social History:  Social History   Socioeconomic History   Marital status: Single    Spouse name: Not on file   Number of children: 1   Years of education: college   Highest education level: Not on file  Occupational History   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    Employer: [employer]  Tobacco Use   Smoking status: Never   Smokeless tobacco: Never  Vaping Use   Vaping status: Never Used  Substance and Sexual Activity   Alcohol use: No    Alcohol/week: 0.0 standard drinks of alcohol   Drug use: No   Sexual activity: Not Currently  Other Topics Concern   Not on file  Social History Narrative   Patient lives at home alone and he is single.   Patient is semi retired.    Education college.   Right handed.   Caffeine two cokes daily.   Social Determinants of Health   Financial Resource Strain: Low Risk  (03/26/2023)   Overall Financial Resource Strain (CARDIA)    Difficulty of Paying Living Expenses: Not hard at all  Food Insecurity: Low Risk  (07/24/2023)   Received from [facility]   Hunger Vital Sign    Worried About Running Out of Food in the Last Year: Never true    Ran Out of Food in the Last Year: Never true  Transportation Needs: Not on file (07/24/2023)  Physical Activity: Inactive (03/26/2023)   Exercise Vital Sign    Days of Exercise per Week: 0 days    Minutes of Exercise per Session: 0 min  Stress: No Stress Concern Present (03/26/2023)   [employer] of Occupational Health - Occupational Stress Questionnaire    Feeling of Stress : Not at all  Social Connections: Socially Isolated (03/26/2023)   Social Connection and Isolation Panel [NHANES]    Frequency of Communication with Friends and Family: Never    Frequency of Social Gatherings with Friends and Family: Never     Attends Religious Services: 1 to 4 times per year    Active Member of [employer] or Organizations: No    Attends Banker Meetings: Never    Marital Status: Never married  Intimate Partner Violence: Not At Risk (03/26/2023)   Humiliation, Afraid, Rape, and Kick questionnaire    Fear of Current or Ex-Partner: No    Emotionally Abused: No    Physically Abused: No    Sexually Abused: No    Family History:  Family History  Problem Relation Age of Onset   Cancer Mother     Medications:   Current Outpatient Medications on File Prior to Visit  Medication Sig Dispense Refill   acetaminophen (TYLENOL) 325 MG tablet Take 2 tablets (650 mg total) by mouth every 6 (six) hours as needed for mild pain (or Fever >/= 101). 30 tablet 0   albuterol (  PROVENTIL HFA;VENTOLIN HFA) 108 (90 Base) MCG/ACT inhaler Inhale 2 puffs into the lungs every 6 (six) hours as needed. 1 Inhaler 5   albuterol (PROVENTIL) (2.5 MG/3ML) 0.083% nebulizer solution Take 3 mLs (2.5 mg total) by nebulization every 6 (six) hours as needed for wheezing or shortness of breath. 150 mL 1   allopurinol (ZYLOPRIM) 300 MG tablet Take 1 tablet (300 mg total) by mouth daily. 90 tablet 1   amiodarone (PACERONE) 200 MG tablet Take 1 tablet (200 mg total) by mouth 2 (two) times daily. 60 tablet 2   ARNUITY ELLIPTA 100 MCG/ACT AEPB Inhale 1 puff into the lungs daily. 30 each 3   aspirin EC 81 MG tablet Take 81 mg by mouth daily.     atorvastatin (LIPITOR) 40 MG tablet Take 1 tablet (40 mg total) by mouth daily. 90 tablet 1   clotrimazole-betamethasone (LOTRISONE) cream Apply 1 application topically 2 (two) times daily as needed (FOR RASH).     diclofenac Sodium (VOLTAREN) 1 % GEL Apply 4 g topically 4 (four) times daily. 100 g 1   digoxin (LANOXIN) 0.125 MG tablet Take 1 tablet (0.125 mg total) by mouth daily. 30 tablet 3   empagliflozin (JARDIANCE) 10 MG TABS tablet Take 1 tablet (10 mg total) by mouth daily before breakfast. 90 tablet 1    ferrous sulfate 325 (65 FE) MG tablet Take 325 mg by mouth daily with breakfast. Reported on 03/16/2016     furosemide (LASIX) 20 MG tablet Take 1 tablet (20 mg total) by mouth daily. 90 tablet 1   lamoTRIgine (LAMICTAL) 100 MG tablet Take 1 tablet (100 mg total) by mouth 2 (two) times daily. 180 tablet 3   losartan (COZAAR) 100 MG tablet Take 100 mg by mouth daily.  3   metoprolol succinate (TOPROL-XL) 25 MG 24 hr tablet Take 1 tablet (25 mg total) by mouth daily. 90 tablet 1   nitroGLYCERIN (NITROSTAT) 0.4 MG SL tablet Place 1 tablet (0.4 mg total) under the tongue every 5 (five) minutes as needed for chest pain. 25 tablet 1   pantoprazole (PROTONIX) 40 MG tablet Take 1 tablet (40 mg total) by mouth daily. 90 tablet 1   potassium chloride (K-DUR) 10 MEQ tablet Take 10 mEq by mouth daily.     tacrolimus (PROTOPIC) 0.1 % ointment      tamsulosin (FLOMAX) 0.4 MG CAPS capsule Take 1 capsule (0.4 mg total) by mouth daily. 90 capsule 1   No current facility-administered medications on file prior to visit.    Allergies:  No Known Allergies    OBJECTIVE:  Physical Exam  Vitals:   09/18/23 0955  BP: (!) 153/78  Pulse: 64  Weight: 234 lb 8 oz (106.4 kg)  Height: 5\' 7"  (1.702 m)    Body mass index is 36.73 kg/m. No results found.   General: well developed, well nourished, very pleasant elderly African-American male, seated, in no evident distress Head: head normocephalic and atraumatic.   Neck: supple with no carotid or supraclavicular bruits Cardiovascular: regular rate and rhythm, no murmurs Musculoskeletal: no deformity Skin:  no rash/petichiae Vascular:  Normal pulses all extremities   Neurologic Exam Mental Status: Awake and fully alert. Oriented to place and time. Recent and remote memory intact. Attention span, concentration and fund of knowledge appropriate. Mood and affect appropriate.  Cranial Nerves: Pupils equal, briskly reactive to light. Extraocular movements full  without nystagmus. Visual fields full to confrontation.  Decreased hearing in right ear by finger rubbing.  Facial sensation intact. Face, tongue, palate moves normally and symmetrically.  Motor: Normal bulk and tone. Normal strength in all tested extremity muscles.  Very slight RUE postural tremor.  No tremor at rest bilaterally nor cogwheel rigidity bilaterally. Sensory.: intact to touch , pinprick , position and vibratory sensation.  Coordination: Rapid alternating movements normal in all extremities. Finger-to-nose and heel-to-shin performed accurately bilaterally. Gait and Station: Arises from chair without difficulty. Stance is normal. Gait demonstrates normal stride length and balance without use of AD. Tandem walk and heel toe without difficulty.  Reflexes: 1+ and symmetric. Toes downgoing.

## 2023-10-01 ENCOUNTER — Telehealth: Payer: Self-pay | Admitting: *Deleted

## 2023-10-01 NOTE — Telephone Encounter (Signed)
Pt called to f/u on CRC test request by Dr. Alvis Lemmings for pt to consider. During call, pt stated he did complete the Cologuard test he got "a few months ago" but never heard back about it. Caller double checked pt's chart which indicates no results for a Cologuard test ordered in January 31 and then again on April 1st, but then apparently "discontinued." Pt states "I have no problem doing it again- I will be glad to." Pt denies any difficulty completing the test or understanding the instructions. Caller reviewed the process that the Cologuard kit would be mailed out with kits, instructions for completing and returning the finished test." And pt repeated he would be glad to complete the test again if caller could notify Dr. Baxter Flattery office to please re-order the Cologuard for him. Pt able to vebalize f/u if he has any questions and noted he does have a f/u visit with Dr. Alvis Lemmings "next year". Letter also sent to pt confirming this info and next PCP appt date and time, 01/30/2024 at 1:30 PM.

## 2023-10-11 ENCOUNTER — Ambulatory Visit (INDEPENDENT_AMBULATORY_CARE_PROVIDER_SITE_OTHER): Payer: Medicare HMO | Admitting: Neurology

## 2023-10-11 DIAGNOSIS — R4182 Altered mental status, unspecified: Secondary | ICD-10-CM | POA: Diagnosis not present

## 2023-10-11 DIAGNOSIS — R251 Tremor, unspecified: Secondary | ICD-10-CM

## 2023-10-11 DIAGNOSIS — R404 Transient alteration of awareness: Secondary | ICD-10-CM

## 2023-10-24 ENCOUNTER — Ambulatory Visit (INDEPENDENT_AMBULATORY_CARE_PROVIDER_SITE_OTHER): Payer: Medicare HMO | Admitting: Podiatry

## 2023-10-24 ENCOUNTER — Encounter: Payer: Self-pay | Admitting: Podiatry

## 2023-10-24 DIAGNOSIS — M79675 Pain in left toe(s): Secondary | ICD-10-CM | POA: Diagnosis not present

## 2023-10-24 DIAGNOSIS — B351 Tinea unguium: Secondary | ICD-10-CM | POA: Diagnosis not present

## 2023-10-24 DIAGNOSIS — M79674 Pain in right toe(s): Secondary | ICD-10-CM | POA: Diagnosis not present

## 2023-10-24 NOTE — Progress Notes (Signed)
This patient presents to the office with chief complaint of long thick painful nails.  Patient says the nails are painful walking and wearing shoes.  This patient is unable to self treat.  This patient is unable to trim hisnails since she is unable to reach his nails.  he presents to the office for preventative foot care services.  General Appearance  Alert, conversant and in no acute stress.  Vascular  Dorsalis pedis and posterior tibial  pulses are palpable  bilaterally.  Capillary return is within normal limits  bilaterally. Temperature is within normal limits  bilaterally.  Neurologic  Senn-Weinstein monofilament wire test within normal limits  bilaterally. Muscle power within normal limits bilaterally.  Nails Thick disfigured discolored nails with subungual debris  from hallux to fifth toes bilaterally. No evidence of bacterial infection or drainage bilaterally.  Orthopedic  No limitations of motion  feet .  No crepitus or effusions noted.  No bony pathology or digital deformities noted.  Skin  normotropic skin with no porokeratosis noted bilaterally.  No signs of infections or ulcers noted.     Onychomycosis  Nails  B/L.  Pain in right toes  Pain in left toes  Debridement of nails both feet followed trimming the nails with dremel tool.    RTC 3 months.   Gardiner Barefoot DPM

## 2023-11-03 NOTE — Procedures (Signed)
   HISTORY: 72 year old male recurrent episode of passing out  TECHNIQUE:  This is a routine 16 channel EEG recording with one channel devoted to a limited EKG recording.  It was performed during wakefulness, drowsiness and asleep.  Photic stimulation were performed as activating procedures.  There are minimum muscle and movement artifact noted.  Upon maximum arousal, posterior dominant waking rhythm consistent of rhythmic alpha range activity. Activities are symmetric over the bilateral posterior derivations and attenuated with eye opening.  Photic stimulation did not alter the tracing.  Hyperventilation was not performed due to history of COPD  During EEG recording, patient developed drowsiness and no deeper stage of sleep was achieved During EEG recording, there was no epileptiform discharge noted.  EKG demonstrate normal sinus rhythm.  CONCLUSION: This is a  normal awake EEG.  There is no electrodiagnostic evidence of epileptiform discharge.  Levert Feinstein, M.D. Ph.D.  [facility] [address] [place], [place] [address] Phone: [phone] Fax:      [phone]

## 2023-11-27 ENCOUNTER — Telehealth: Payer: Self-pay | Admitting: *Deleted

## 2023-11-27 DIAGNOSIS — Z1211 Encounter for screening for malignant neoplasm of colon: Secondary | ICD-10-CM

## 2023-11-27 NOTE — Telephone Encounter (Addendum)
Addendum:  Telephone call from [facility] @ [facility], verified patient's name, date of birth, address, and provider. Jesusita Oka states patient's request for Cologuard kit reshipment was processed on 06/02/23 and received verification that kit was delivered to patient on 06/06/23. States they have not received specimen from patient and results in their system. Discussed next steps: Health Equity Program Manager (HEPM) has sent request to Dr. Alvis Lemmings for Cologuard kit reorder, continue [facility]) chart review as needed, and will follow up with patient if needed to verify completion. HEPM will also follow up with [facility] [facility] to as needed to verify  CRC Screening completion.     Dr. Baxter Flattery team follow up required: Patient need assistance with Cologuard reorder. Please contact patient at [phone]  to advise reorder sent and when to expect kit delivery by 11/30/23.  Also contact Elmer Picker BSN, RN,CCM, Health Equity Program Manager (HEPM) at [phone]  if additional information needed.  Chart reviewed and [employer] verification completed.    Chart review highlights and potential barriers to colorectal cancer screening (CRC) screening: Patient stated he did complete the Cologuard test he got "a few months ago" but never heard back about it. Patient has agreed to redo Cologuard CRC screening when new kit received. Patient's previous Cologuard orders were discontinued on 07/25/23, 03/27/23, 01/25/23. Patient also was given Cologuard option for CRC Screening on 07/19/18. No Cologuard results in [facility] ( [facility]) at this time.   Telephone call to Harlene Ramus @ [facility] [facility] via teams, verified patient's name, date of birth, and address. Call regarding status of patient's Cologuard kit. Discussed above chart review highlights.  Jesusita Oka states patient called the [employer] call center on 06/02/23 and advised that he had not received the Cologuard kit (from 07/25/22 order) and request  was submitted for another kit to be shipped. Jesusita Oka states he will verify with [facility] lab shipping if 2nd kit was resent, if patient's specimen was received, and notify HEPM Copy) of next steps.   Tonika Eden H. Docia Furl, Charity fundraiser, [facility] [phone]

## 2023-11-28 NOTE — Telephone Encounter (Signed)
Can patient do Cologuard, there was a referral placed to GI 1 year ago

## 2023-11-28 NOTE — Addendum Note (Signed)
Addended byHoy Register on: 11/28/2023 01:02 PM   Modules accepted: Orders

## 2023-11-28 NOTE — Telephone Encounter (Signed)
Order for Cologuard has been placed.

## 2023-11-29 NOTE — Telephone Encounter (Signed)
Pt has been informed that order has been placed.

## 2023-12-21 DIAGNOSIS — E782 Mixed hyperlipidemia: Secondary | ICD-10-CM | POA: Diagnosis not present

## 2023-12-21 DIAGNOSIS — I10 Essential (primary) hypertension: Secondary | ICD-10-CM | POA: Diagnosis not present

## 2023-12-21 DIAGNOSIS — I2510 Atherosclerotic heart disease of native coronary artery without angina pectoris: Secondary | ICD-10-CM | POA: Diagnosis not present

## 2023-12-21 DIAGNOSIS — R7303 Prediabetes: Secondary | ICD-10-CM | POA: Diagnosis not present

## 2024-01-04 ENCOUNTER — Other Ambulatory Visit: Payer: Self-pay | Admitting: Adult Health

## 2024-01-04 ENCOUNTER — Other Ambulatory Visit: Payer: Self-pay | Admitting: Family Medicine

## 2024-01-04 DIAGNOSIS — I10 Essential (primary) hypertension: Secondary | ICD-10-CM

## 2024-01-19 ENCOUNTER — Other Ambulatory Visit: Payer: Self-pay | Admitting: Family Medicine

## 2024-01-19 DIAGNOSIS — I110 Hypertensive heart disease with heart failure: Secondary | ICD-10-CM

## 2024-01-21 NOTE — Telephone Encounter (Signed)
Requested medication (s) are due for refill today: yes  Requested medication (s) are on the active medication list: yes  Last refill:  07/25/23 #90 1 refills  Future visit scheduled: yes in 1 week   Notes to clinic:   protocol failed last labs 01/25/23.  Do you want to refill Rx?     Requested Prescriptions  Pending Prescriptions Disp Refills   furosemide (LASIX) 20 MG tablet [Pharmacy Med Name: FUROSEMIDE 20 MG TABLET] 90 tablet 1    Sig: TAKE 1 TABLET BY MOUTH EVERY DAY     Cardiovascular:  Diuretics - Loop Failed - 01/21/2024 11:05 AM      Failed - K in normal range and within 180 days    Potassium  Date Value Ref Range Status  01/25/2023 4.1 3.5 - 5.2 mmol/L Final         Failed - Ca in normal range and within 180 days    Calcium  Date Value Ref Range Status  01/25/2023 9.3 8.6 - 10.2 mg/dL Final   Calcium, Ion  Date Value Ref Range Status  12/08/2015 1.01 (L) 1.13 - 1.30 mmol/L Final         Failed - Na in normal range and within 180 days    Sodium  Date Value Ref Range Status  01/25/2023 143 134 - 144 mmol/L Final         Failed - Cr in normal range and within 180 days    Creat  Date Value Ref Range Status  03/12/2015 1.06 0.50 - 1.35 mg/dL Final   Creatinine, Ser  Date Value Ref Range Status  01/25/2023 1.19 0.76 - 1.27 mg/dL Final   Creatinine,U  Date Value Ref Range Status  01/13/2011 148.1 mg/dL Final    Comment:    See lab report for associated comment(s)         Failed - Cl in normal range and within 180 days    Chloride  Date Value Ref Range Status  01/25/2023 104 96 - 106 mmol/L Final         Failed - Mg Level in normal range and within 180 days    Magnesium  Date Value Ref Range Status  12/08/2015 2.2 1.7 - 2.4 mg/dL Final         Failed - Last BP in normal range    BP Readings from Last 1 Encounters:  09/18/23 (!) 153/78         Passed - Valid encounter within last 6 months    Recent Outpatient Visits           6 months ago  Hypertensive heart disease with chronic diastolic congestive heart failure (HCC)   [facility] [facility] [place]. [facility] Hoy Register, MD   9 months ago Hypertensive heart disease with chronic diastolic congestive heart failure [facility])   [facility] [place]. [facility] Lois Huxley, Cornelius Moras, RPH-CPP   10 months ago Encounter for [employer] annual wellness exam   [facility] Of [place]. [facility] Hoy Register, MD   12 months ago Screening for colon cancer   [place] [facility] Of [place]. [facility] Hoy Register, MD   1 year ago Prediabetes   [place] [facility] [place] Of [place]. [facility] Hoy Register, MD  Future Appointments             In 1 week Hoy Register, MD [facility] [place] - [facility] Dept Of [place]. [facility]   In 4 months Margo Aye, Duanne Guess, MD [facility] at [facility]

## 2024-01-24 ENCOUNTER — Encounter: Payer: Self-pay | Admitting: Podiatry

## 2024-01-24 ENCOUNTER — Ambulatory Visit (INDEPENDENT_AMBULATORY_CARE_PROVIDER_SITE_OTHER): Payer: 59 | Admitting: Podiatry

## 2024-01-24 DIAGNOSIS — M79674 Pain in right toe(s): Secondary | ICD-10-CM

## 2024-01-24 DIAGNOSIS — M79675 Pain in left toe(s): Secondary | ICD-10-CM

## 2024-01-24 DIAGNOSIS — B351 Tinea unguium: Secondary | ICD-10-CM

## 2024-01-24 NOTE — Progress Notes (Signed)
This patient presents to the office with chief complaint of long thick painful nails.  Patient says the nails are painful walking and wearing shoes.  This patient is unable to self treat.  This patient is unable to trim hisnails since she is unable to reach his nails.  he presents to the office for preventative foot care services.  General Appearance  Alert, conversant and in no acute stress.  Vascular  Dorsalis pedis and posterior tibial  pulses are palpable  bilaterally.  Capillary return is within normal limits  bilaterally. Temperature is within normal limits  bilaterally.  Neurologic  Senn-Weinstein monofilament wire test within normal limits  bilaterally. Muscle power within normal limits bilaterally.  Nails Thick disfigured discolored nails with subungual debris  from hallux to fifth toes bilaterally. No evidence of bacterial infection or drainage bilaterally.  Orthopedic  No limitations of motion  feet .  No crepitus or effusions noted.  No bony pathology or digital deformities noted.  Skin  normotropic skin with no porokeratosis noted bilaterally.  No signs of infections or ulcers noted.     Onychomycosis  Nails  B/L.  Pain in right toes  Pain in left toes  Debridement of nails both feet followed trimming the nails with dremel tool.    RTC 3 months.   Helane Gunther DPM

## 2024-01-27 ENCOUNTER — Other Ambulatory Visit: Payer: Self-pay | Admitting: Family Medicine

## 2024-01-27 DIAGNOSIS — K219 Gastro-esophageal reflux disease without esophagitis: Secondary | ICD-10-CM

## 2024-01-29 ENCOUNTER — Other Ambulatory Visit: Payer: Self-pay | Admitting: Family Medicine

## 2024-01-29 DIAGNOSIS — I10 Essential (primary) hypertension: Secondary | ICD-10-CM

## 2024-01-30 ENCOUNTER — Encounter: Payer: Self-pay | Admitting: Family Medicine

## 2024-01-30 ENCOUNTER — Ambulatory Visit: Payer: 59 | Attending: Family Medicine | Admitting: Family Medicine

## 2024-01-30 VITALS — BP 144/74 | HR 63 | Resp 20 | Ht 67.0 in | Wt 232.6 lb

## 2024-01-30 DIAGNOSIS — R399 Unspecified symptoms and signs involving the genitourinary system: Secondary | ICD-10-CM

## 2024-01-30 DIAGNOSIS — H9313 Tinnitus, bilateral: Secondary | ICD-10-CM

## 2024-01-30 DIAGNOSIS — Z1211 Encounter for screening for malignant neoplasm of colon: Secondary | ICD-10-CM | POA: Diagnosis not present

## 2024-01-30 DIAGNOSIS — R29818 Other symptoms and signs involving the nervous system: Secondary | ICD-10-CM | POA: Diagnosis not present

## 2024-01-30 DIAGNOSIS — M1A0710 Idiopathic chronic gout, right ankle and foot, without tophus (tophi): Secondary | ICD-10-CM

## 2024-01-30 DIAGNOSIS — I10 Essential (primary) hypertension: Secondary | ICD-10-CM

## 2024-01-30 DIAGNOSIS — I110 Hypertensive heart disease with heart failure: Secondary | ICD-10-CM | POA: Diagnosis not present

## 2024-01-30 DIAGNOSIS — I5032 Chronic diastolic (congestive) heart failure: Secondary | ICD-10-CM

## 2024-01-30 MED ORDER — ATORVASTATIN CALCIUM 40 MG PO TABS: 40.0000 mg | ORAL_TABLET | Freq: Every day | ORAL | 1 refills | Status: DC

## 2024-01-30 MED ORDER — EMPAGLIFLOZIN 10 MG PO TABS: 10.0000 mg | ORAL_TABLET | Freq: Every day | ORAL | 1 refills | Status: DC

## 2024-01-30 MED ORDER — TAMSULOSIN HCL 0.4 MG PO CAPS: 0.4000 mg | ORAL_CAPSULE | Freq: Every day | ORAL | 1 refills | Status: DC

## 2024-01-30 MED ORDER — METOPROLOL SUCCINATE ER 25 MG PO TB24: 25.0000 mg | ORAL_TABLET | Freq: Every day | ORAL | 1 refills | Status: DC

## 2024-01-30 MED ORDER — ALLOPURINOL 300 MG PO TABS: 300.0000 mg | ORAL_TABLET | Freq: Every day | ORAL | 1 refills | Status: DC

## 2024-01-30 NOTE — Progress Notes (Signed)
 Patient is still experiencing ringing in the ears.

## 2024-01-30 NOTE — Patient Instructions (Signed)
 [facility]  [address] [place] [address]  Ph# [phone]

## 2024-01-30 NOTE — Progress Notes (Signed)
 Subjective:  Patient ID: [name] [name], male    DOB: 1951-11-12  Age: 73 y.o. MRN: [id number]  CC: Medical Management of Chronic Issues   HPI [name] [name] is a 74 y.o. year old male with a history of paroxysmal A. fib, atrial flutter, diastolic congestive heart failure (EF 55-60% in 11/2017), CAD (status post cardiac cath in 11/2015 - mid LAD 20% stenosis, dist LAD 40% stenosis, Ost RCA to prox RCA 30% stenosis, normal LV systolic function), sick sinus syndrome, COPD, GERD, seizures (previously managed by [facility]), BPH, hypogonadism (managed by endocrine),Gout here for a follow up visit.   Interval History: Discussed the use of AI scribe software for clinical note transcription with the patient, who gave verbal consent to proceed.  He presents with ongoing tinnitus. He has seen an ENT specialist who noted decreased hearing in the right ear and recommended a hearing aid, which the patient is hesitant to use.  The patient also reports increased sleepiness, which he attributes to sleep apnea. He has not been tested for sleep apnea recently and does not use a CPAP machine. His sleep schedule has recently changed due to caring for others early in the morning, and he typically wakes up around 3 AM. He has a history of interrupted breathing during sleep.  The patient's atrial fibrillation is managed with amiodarone  and digoxin , and he reports occasional episodes of atrial fibrillation but no chest pain. He has a follow-up appointment with his cardiologist in March.  His seizure disorder has been well-controlled with Lamictal , with no seizures since 2018. He also sees a pulmonologist annually for his COPD and uses inhalers as prescribed.  The patient also mentions a rash for which he sees a dermatologist. His blood pressure is elevated today and he endorses taking his antihypertensive.       Past Medical History:  Diagnosis Date   Anemia    Asthma    Atrial fibrillation (HCC)    CHF (congestive  heart failure) (HCC)    COPD (chronic obstructive pulmonary disease) (HCC)    Heart disease    Hypertension    Syncope     Past Surgical History:  Procedure Laterality Date   CARDIAC CATHETERIZATION N/A 12/08/2015   Procedure: Left Heart Cath and Coronary Angiography;  Surgeon: Rober Chroman, MD;  Location: [facility];  Service: Cardiovascular;  Laterality: N/A;   ESOPHAGUS SURGERY     SHOULDER CLOSED REDUCTION Left 12/01/2017   Procedure: CLOSED REDUCTION SHOULDER;  Surgeon: Celena Sharper, MD;  [facility];  Service: Orthopedics;  Laterality: Left;    Family History  Problem Relation Age of Onset   Cancer Mother     Social History   Socioeconomic History   Marital status: Single    Spouse name: Not on file   Number of children: 1   Years of education: college   Highest education level: Not on file  Occupational History   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    Employer: [employer]  Tobacco Use   Smoking status: Never   Smokeless tobacco: Never  Vaping Use   Vaping status: Never Used  Substance and Sexual Activity   Alcohol use: No    Alcohol/week: 0.0 standard drinks of alcohol   Drug use: No   Sexual activity: Not Currently  Other Topics Concern   Not on file  Social History Narrative   Patient lives at home alone and he is single.   Patient is semi retired.    Education  college.   Right handed.   Caffeine two cokes daily.   Social Drivers of Corporate Investment Banker Strain: Low Risk  (03/26/2023)   Overall Financial Resource Strain (CARDIA)    Difficulty of Paying Living Expenses: Not hard at all  Food Insecurity: Low Risk  (07/24/2023)   Received from [facility]   Hunger Vital Sign    Worried About Running Out of Food in the Last Year: Never true    Ran Out of Food in the Last Year: Never true  Transportation Needs: Not on file (07/24/2023)  Physical Activity: Inactive (03/26/2023)   Exercise Vital Sign    Days of Exercise per Week: 0 days     Minutes of Exercise per Session: 0 min  Stress: No Stress Concern Present (03/26/2023)   [employer] of Occupational Health - Occupational Stress Questionnaire    Feeling of Stress : Not at all  Social Connections: Socially Isolated (03/26/2023)   Social Connection and Isolation Panel [NHANES]    Frequency of Communication with Friends and Family: Never    Frequency of Social Gatherings with Friends and Family: Never    Attends Religious Services: 1 to 4 times per year    Active Member of [employer] or Organizations: No    Attends Banker Meetings: Never    Marital Status: Never married    No Known Allergies  Outpatient Medications Prior to Visit  Medication Sig Dispense Refill   acetaminophen  (TYLENOL ) 325 MG tablet Take 2 tablets (650 mg total) by mouth every 6 (six) hours as needed for mild pain (or Fever >/= 101). 30 tablet 0   albuterol  (PROVENTIL  HFA;VENTOLIN  HFA) 108 (90 Base) MCG/ACT inhaler Inhale 2 puffs into the lungs every 6 (six) hours as needed. 1 Inhaler 5   albuterol  (PROVENTIL ) (2.5 MG/3ML) 0.083% nebulizer solution Take 3 mLs (2.5 mg total) by nebulization every 6 (six) hours as needed for wheezing or shortness of breath. 150 mL 1   amiodarone  (PACERONE ) 200 MG tablet Take 1 tablet (200 mg total) by mouth 2 (two) times daily. 60 tablet 2   ARNUITY ELLIPTA  100 MCG/ACT AEPB Inhale 1 puff into the lungs daily. 30 each 3   aspirin  EC 81 MG tablet Take 81 mg by mouth daily.     clotrimazole -betamethasone  (LOTRISONE ) cream Apply 1 application topically 2 (two) times daily as needed (FOR RASH).     diclofenac  Sodium (VOLTAREN ) 1 % GEL Apply 4 g topically 4 (four) times daily. 100 g 1   digoxin  (LANOXIN ) 0.125 MG tablet Take 1 tablet (0.125 mg total) by mouth daily. 30 tablet 3   ferrous sulfate  325 (65 FE) MG tablet Take 325 mg by mouth daily with breakfast. Reported on 03/16/2016     furosemide  (LASIX ) 20 MG tablet TAKE 1 TABLET BY MOUTH EVERY DAY 30 tablet 0    lamoTRIgine  (LAMICTAL ) 100 MG tablet TAKE 1 TABLET BY MOUTH TWICE A DAY 180 tablet 3   losartan (COZAAR) 100 MG tablet Take 100 mg by mouth daily.  3   nitroGLYCERIN  (NITROSTAT ) 0.4 MG SL tablet Place 1 tablet (0.4 mg total) under the tongue every 5 (five) minutes as needed for chest pain. 25 tablet 1   pantoprazole  (PROTONIX ) 40 MG tablet TAKE 1 TABLET BY MOUTH EVERY DAY 90 tablet 1   potassium chloride  (K-DUR) 10 MEQ tablet Take 10 mEq by mouth daily.     tacrolimus (PROTOPIC) 0.1 % ointment      allopurinol  (ZYLOPRIM ) 300 MG  tablet Take 1 tablet (300 mg total) by mouth daily. 90 tablet 1   atorvastatin  (LIPITOR) 40 MG tablet Take 1 tablet (40 mg total) by mouth daily. 90 tablet 1   empagliflozin  (JARDIANCE ) 10 MG TABS tablet Take 1 tablet (10 mg total) by mouth daily before breakfast. 90 tablet 1   metoprolol  succinate (TOPROL -XL) 25 MG 24 hr tablet TAKE 1 TABLET (25 MG TOTAL) BY MOUTH DAILY. 30 tablet 0   tamsulosin  (FLOMAX ) 0.4 MG CAPS capsule Take 1 capsule (0.4 mg total) by mouth daily. 90 capsule 1   No facility-administered medications prior to visit.     ROS Review of Systems  Constitutional:  Negative for activity change and appetite change.  HENT:  Positive for tinnitus. Negative for sinus pressure and sore throat.   Respiratory:  Negative for chest tightness, shortness of breath and wheezing.   Cardiovascular:  Negative for chest pain and palpitations.  Gastrointestinal:  Negative for abdominal distention, abdominal pain and constipation.  Genitourinary: Negative.   Musculoskeletal: Negative.   Psychiatric/Behavioral:  Negative for behavioral problems and dysphoric mood.     Objective:  BP (!) 144/74   Pulse 63   Resp 20   Ht 5' 7 (1.702 m)   Wt 232 lb 9.6 oz (105.5 kg)   SpO2 100%   BMI 36.43 kg/m      01/30/2024    2:12 PM 01/30/2024    1:41 PM 09/18/2023    9:55 AM  BP/Weight  Systolic BP 144 142 153  Diastolic BP 74 76 78  Wt. (Lbs)  232.6 234.5  BMI  36.43  kg/m2 36.73 kg/m2      Physical Exam Constitutional:      Appearance: He is well-developed.  HENT:     Right Ear: Tympanic membrane normal.     Left Ear: Tympanic membrane normal.  Cardiovascular:     Rate and Rhythm: Normal rate.     Heart sounds: Normal heart sounds. No murmur heard. Pulmonary:     Effort: Pulmonary effort is normal.     Breath sounds: Normal breath sounds. No wheezing or rales.  Chest:     Chest wall: No tenderness.  Abdominal:     General: Bowel sounds are normal. There is no distension.     Palpations: Abdomen is soft. There is no mass.     Tenderness: There is no abdominal tenderness.  Musculoskeletal:        General: Normal range of motion.     Right lower leg: No edema.     Left lower leg: No edema.  Neurological:     Mental Status: He is alert and oriented to person, place, and time.  Psychiatric:        Mood and Affect: Mood normal.        Latest Ref Rng & Units 01/25/2023   12:02 PM 07/25/2022    3:01 PM 03/08/2022   12:15 PM  CMP  Glucose 70 - 99 mg/dL 81  76  88   BUN 8 - 27 mg/dL 9  14  11    Creatinine 0.76 - 1.27 mg/dL 8.80  8.71  8.87   Sodium 134 - 144 mmol/L 143  142  140   Potassium 3.5 - 5.2 mmol/L 4.1  3.8  4.0   Chloride 96 - 106 mmol/L 104  103  102   CO2 20 - 29 mmol/L 26  27  27    Calcium  8.6 - 10.2 mg/dL 9.3  9.5  9.0  Total Protein 6.0 - 8.5 g/dL 7.8  7.6  7.6   Total Bilirubin 0.0 - 1.2 mg/dL 0.2  <9.7  0.4   Alkaline Phos 44 - 121 IU/L 113  93  102   AST 0 - 40 IU/L 21  20  24    ALT 0 - 44 IU/L 14  10  14      Lipid Panel     Component Value Date/Time   CHOL 146 10/07/2021 1017   TRIG 85 10/07/2021 1017   HDL 45 10/07/2021 1017   CHOLHDL 3.2 10/07/2021 1017   CHOLHDL 2.5 12/09/2015 0133   VLDL 11 12/09/2015 0133   LDLCALC 85 10/07/2021 1017    CBC    Component Value Date/Time   WBC 7.8 03/08/2022 1215   WBC 8.3 01/20/2019 0345   RBC 5.33 03/08/2022 1215   RBC 5.22 01/20/2019 0345   HGB 11.5 (L)  03/08/2022 1215   HCT 37.6 03/08/2022 1215   PLT 357 03/08/2022 1215   MCV 71 (L) 03/08/2022 1215   MCH 21.6 (L) 03/08/2022 1215   MCH 21.1 (L) 01/20/2019 0345   MCHC 30.6 (L) 03/08/2022 1215   MCHC 29.5 (L) 01/20/2019 0345   RDW 16.0 (H) 03/08/2022 1215   LYMPHSABS 2.4 03/08/2022 1215   MONOABS 1.2 (H) 12/01/2017 2242   EOSABS 0.6 (H) 03/08/2022 1215   BASOSABS 0.1 03/08/2022 1215    Lab Results  Component Value Date   HGBA1C 5.6 07/25/2022    Assessment & Plan:      Tinnitus Persistent despite ENT evaluation and recommendation for hearing aid. Patient reluctant to use hearing aid. -Refer back to ENT for follow-up as patient mentioned.  Suspected sleep apnea Reports increased sleepiness and change in sleep schedule. History of waking up at night and feeling like not breathing properly. No recent sleep study or use of CPAP. -Order sleep study.   Atrial fibrillation Reports occasional episodes. -In sinus rhythm no chest pain. On Amiodarone  and Digoxin . No anticoagulation. -Continue current medications. -Cardiology follow-up in March.  Seizure disorder No seizures since 2018. On Lamictal . -Continue Lamictal .  COPD Follow-up with Dr. Geronimo. On inhalers. -Continue current management.  Hypertension Blood pressure mildly elevated at visit. -BP at last office visit with me was 125/76 -Recheck blood pressure. -Repeat blood pressure is still elevated -He sees cardiology next month hence I will defer to them to adjust his medication as his goal BP should be less than 130/80 -His med list reveals he is on losartan but this cannot be ascertained as it was prescribed by an outside provider          Meds ordered this encounter  Medications   allopurinol  (ZYLOPRIM ) 300 MG tablet    Sig: Take 1 tablet (300 mg total) by mouth daily.    Dispense:  90 tablet    Refill:  1   atorvastatin  (LIPITOR) 40 MG tablet    Sig: Take 1 tablet (40 mg total) by mouth daily.     Dispense:  90 tablet    Refill:  1   empagliflozin  (JARDIANCE ) 10 MG TABS tablet    Sig: Take 1 tablet (10 mg total) by mouth daily before breakfast.    Dispense:  90 tablet    Refill:  1   metoprolol  succinate (TOPROL -XL) 25 MG 24 hr tablet    Sig: Take 1 tablet (25 mg total) by mouth daily.    Dispense:  90 tablet    Refill:  1   tamsulosin  (FLOMAX )  0.4 MG CAPS capsule    Sig: Take 1 capsule (0.4 mg total) by mouth daily.    Dispense:  90 capsule    Refill:  1    Follow-up: Return in about 6 months (around 07/29/2024) for Chronic medical conditions.       Corrina Sabin, MD, FAAFP. [facility] [place], [place] [phone]   01/30/2024, 5:41 PM

## 2024-01-31 ENCOUNTER — Telehealth: Payer: Self-pay

## 2024-01-31 NOTE — Telephone Encounter (Signed)
 [facility] to [name] to schedule [facility] for patient. I was informed that they will reach out to the patient once they get study approved through patients insurance.

## 2024-02-01 ENCOUNTER — Ambulatory Visit: Payer: 59 | Attending: Family Medicine

## 2024-02-01 DIAGNOSIS — Z1211 Encounter for screening for malignant neoplasm of colon: Secondary | ICD-10-CM

## 2024-02-02 LAB — HEMOGLOBIN A1C
Est. average glucose Bld gHb Est-mCnc: 123 mg/dL
Hgb A1c MFr Bld: 5.9 % — ABNORMAL HIGH (ref 4.8–5.6)

## 2024-02-15 ENCOUNTER — Other Ambulatory Visit: Payer: Self-pay | Admitting: Family Medicine

## 2024-02-15 DIAGNOSIS — I110 Hypertensive heart disease with heart failure: Secondary | ICD-10-CM

## 2024-02-19 ENCOUNTER — Other Ambulatory Visit: Payer: Self-pay | Admitting: Family Medicine

## 2024-02-19 DIAGNOSIS — I110 Hypertensive heart disease with heart failure: Secondary | ICD-10-CM

## 2024-02-19 NOTE — Telephone Encounter (Signed)
 Requested medications are due for refill today.  yes  Requested medications are on the active medications list.  yes  Last refill. 01/21/2024 #30 0 rf  Future visit scheduled.   yes  Notes to clinic.  Labs are expired.    Requested Prescriptions  Pending Prescriptions Disp Refills   furosemide (LASIX) 20 MG tablet [Pharmacy Med Name: FUROSEMIDE 20 MG TABLET] 30 tablet 0    Sig: TAKE 1 TABLET BY MOUTH EVERY DAY     Cardiovascular:  Diuretics - Loop Failed - 02/19/2024  5:43 PM      Failed - K in normal range and within 180 days    Potassium  Date Value Ref Range Status  01/25/2023 4.1 3.5 - 5.2 mmol/L Final         Failed - Ca in normal range and within 180 days    Calcium  Date Value Ref Range Status  01/25/2023 9.3 8.6 - 10.2 mg/dL Final   Calcium, Ion  Date Value Ref Range Status  12/08/2015 1.01 (L) 1.13 - 1.30 mmol/L Final         Failed - Na in normal range and within 180 days    Sodium  Date Value Ref Range Status  01/25/2023 143 134 - 144 mmol/L Final         Failed - Cr in normal range and within 180 days    Creat  Date Value Ref Range Status  03/12/2015 1.06 0.50 - 1.35 mg/dL Final   Creatinine, Ser  Date Value Ref Range Status  01/25/2023 1.19 0.76 - 1.27 mg/dL Final   Creatinine,U  Date Value Ref Range Status  01/13/2011 148.1 mg/dL Final    Comment:    See lab report for associated comment(s)         Failed - Cl in normal range and within 180 days    Chloride  Date Value Ref Range Status  01/25/2023 104 96 - 106 mmol/L Final         Failed - Mg Level in normal range and within 180 days    Magnesium  Date Value Ref Range Status  12/08/2015 2.2 1.7 - 2.4 mg/dL Final         Failed - Last BP in normal range    BP Readings from Last 1 Encounters:  01/30/24 (!) 144/74         Passed - Valid encounter within last 6 months    Recent Outpatient Visits           2 weeks ago Suspected sleep apnea   [facility] - A Dept  Of [place]. [facility] Hoy Register, MD   6 months ago Hypertensive heart disease with chronic diastolic congestive heart failure [facility])   [facility] - A Dept Of [place]. [facility] Hoy Register, MD   10 months ago Hypertensive heart disease with chronic diastolic congestive heart failure [facility])   [facility] - A Dept Of [place]. [facility] Lois Huxley, Cornelius Moras, RPH-CPP   11 months ago Encounter for [employer] annual wellness exam   [facility] [place] - A Dept Of [place]. [facility] Hoy Register, MD   1 year ago Screening for colon cancer   [facility] - A Dept Of [place]. [facility] Hoy Register, MD       Future Appointments  In 3 months [place], Danford Bad., MD [facility] at [facility]   In 5 months Hoy Register, MD [facility] - A Dept Of [place]. [facility]

## 2024-03-11 ENCOUNTER — Ambulatory Visit: Payer: Self-pay | Admitting: Family Medicine

## 2024-03-11 NOTE — Telephone Encounter (Signed)
 FYI  Patient was scheduled with provider at [facility]

## 2024-03-11 NOTE — Telephone Encounter (Signed)
 Copied from CRM [id number]. Topic: Clinical - Red Word Triage >> Mar 11, 2024 12:44 PM DeAngela L wrote: Red Word that prompted transfer to Nurse Triage: Patient is having a tightening across the eyes, and also a ringing in the ear possible and more frequently happening and it comes and goes through out the day, the tightening on the eye area feels like a rubber band being pulled  Chief Complaint: weakness, eye pain Symptoms: overall general weakness, fatigue, bilateral eye pain/discomfort Frequency: last couple of weeks but worsening last couple of day Pertinent Negatives: Patient denies slurred speech, stroke like s/s, n.v, fever Disposition: [] ED /[] Urgent Care (no appt availability in office) / [x] Appointment(In office/virtual)/ []  [facility]/ [] Home Care/ [] Refused Recommended Disposition /[] [facility]/ []  Follow-up with PCP Additional Notes: pt called in to rule out stroke like s/s: stated he really does not think he is having a stroke but wants to be checked.  No stroke like s/s present.  C/o bilateral eye discomfort that feels like"A rubber band is stretching across eye" no vision issues.  C/o ringing in ears that another provider stated was due to needing hearing aide in right hear.  Patient also stated when eye discomfort gets worse ringing in right ear increases.  Pt reported: History of asthma & anemia & a.fib.  in office appt scheduled for tomorrow.   Reason for Disposition  [1] MILD weakness (i.e., does not interfere with ability to work, go to school, normal activities) AND [2] persists > 1 week  MILD eye pains are a recurrent problem  Answer Assessment - Initial Assessment Questions 1. ONSET: "When did the pain start?" (e.g., minutes, hours, days)     yesterday 2. TIMING: "Does the pain come and go, or has it been constant since it started?" (e.g., constant, intermittent, fleeting)     intermittent 3. SEVERITY: "How bad is the pain?"   (Scale 1-10; mild,  moderate or severe)   - MILD (1-3): doesn't interfere with normal activities    - MODERATE (4-7): interferes with normal activities or awakens from sleep    - SEVERE (8-10): excruciating pain and patient unable to do normal activities     mild 4. LOCATION: "Where does it hurt?"  (e.g., eyelid, eye, cheekbone)     bilateral 5. CAUSE: "What do you think is causing the pain?"     Unknown -  6. VISION: "Do you have blurred vision or changes in your vision?"      no 7. EYE DISCHARGE: "Is there any discharge (pus) from the eye(s)?"  If Yes, ask: "What color is it?"      no 8. FEVER: "Do you have a fever?" If Yes, ask: "What is it, how was it measured, and when did it start?"      no 9. OTHER SYMPTOMS: "Do you have any other symptoms?" (e.g., headache, nasal discharge, facial rash)     no 10. PREGNANCY: "Is there any chance you are pregnant?" "When was your last menstrual period?"       N/a  Answer Assessment - Initial Assessment Questions 1. DESCRIPTION: "Describe how you are feeling."     Weakness, fatigue/tired 2. SEVERITY: "How bad is it?"  "Can you stand and walk?"   - MILD (0-3): Feels weak or tired, but does not interfere with work, school or normal activities.   - MODERATE (4-7): Able to stand and walk; weakness interferes with work, school, or normal activities.   - SEVERE (8-10): Unable to  stand or walk; unable to do usual activities.     moderate 3. ONSET: "When did these symptoms begin?" (e.g., hours, days, weeks, months)     Last couple of weeks 4. CAUSE: "What do you think is causing the weakness or fatigue?" (e.g., not drinking enough fluids, medical problem, trouble sleeping)     Currently drinking enough fluids 5. NEW MEDICINES:  "Have you started on any new medicines recently?" (e.g., opioid pain medicines, benzodiazepines, muscle relaxants, antidepressants, antihistamines, neuroleptics, beta blockers)     N/a 6. OTHER SYMPTOMS: "Do you have any other symptoms?" (e.g.,  chest pain, fever, cough, SOB, vomiting, diarrhea, bleeding, other areas of pain)     no 7. PREGNANCY: "Is there any chance you are pregnant?" "When was your last menstrual period?"     N/a  Protocols used: Eye Pain and Other Symptoms-A-AH, Weakness (Generalized) and Fatigue-A-AH

## 2024-03-13 ENCOUNTER — Encounter: Payer: Self-pay | Admitting: Family Medicine

## 2024-03-13 ENCOUNTER — Ambulatory Visit (INDEPENDENT_AMBULATORY_CARE_PROVIDER_SITE_OTHER): Admitting: Family Medicine

## 2024-03-13 VITALS — BP 138/82 | HR 77 | Temp 97.9°F | Wt 231.6 lb

## 2024-03-13 DIAGNOSIS — J4520 Mild intermittent asthma, uncomplicated: Secondary | ICD-10-CM

## 2024-03-13 DIAGNOSIS — I10 Essential (primary) hypertension: Secondary | ICD-10-CM

## 2024-03-13 DIAGNOSIS — H9041 Sensorineural hearing loss, unilateral, right ear, with unrestricted hearing on the contralateral side: Secondary | ICD-10-CM | POA: Insufficient documentation

## 2024-03-13 DIAGNOSIS — G4733 Obstructive sleep apnea (adult) (pediatric): Secondary | ICD-10-CM

## 2024-03-13 DIAGNOSIS — J301 Allergic rhinitis due to pollen: Secondary | ICD-10-CM | POA: Diagnosis not present

## 2024-03-13 DIAGNOSIS — H9311 Tinnitus, right ear: Secondary | ICD-10-CM

## 2024-03-13 DIAGNOSIS — R569 Unspecified convulsions: Secondary | ICD-10-CM

## 2024-03-13 DIAGNOSIS — H6993 Unspecified Eustachian tube disorder, bilateral: Secondary | ICD-10-CM | POA: Diagnosis not present

## 2024-03-13 DIAGNOSIS — D508 Other iron deficiency anemias: Secondary | ICD-10-CM

## 2024-03-13 MED ORDER — LEVOCETIRIZINE DIHYDROCHLORIDE 5 MG PO TABS: 5.0000 mg | ORAL_TABLET | Freq: Every evening | ORAL | 3 refills | Status: DC

## 2024-03-13 MED ORDER — FLUTICASONE PROPIONATE 50 MCG/ACT NA SUSP: 2.0000 | Freq: Every day | NASAL | 6 refills | Status: DC

## 2024-03-13 NOTE — Patient Instructions (Addendum)
 VISIT SUMMARY:  Today, we discussed your ongoing issues with ringing in your right ear and facial tightness. We reviewed your concerns about the possibility of a stroke, given your history of atrial fibrillation and heart disease. We also touched on your past seizures, asthma, and anemia, and how these conditions are currently being managed.  YOUR PLAN:  -TINNITUS: Tinnitus is a condition characterized by ringing or noise perception in the ears without an external sound source. Your symptoms have been present since late last year or early this year. An MRI showed no acute issues, and there is no evidence of a stroke. We will prescribe a nasal steroid spray (fluticasone) and allergy pill (levocetirizine) to address potential eustachian tube dysfunction. If symptoms persist or worsen, consider following up with an ENT or neurology specialist. We recommend holding off on a hearing aid for now, as you prefer.  -HYPERTENSION: Hypertension, or high blood pressure, is well-managed with your current medication. Your blood pressure today was 138/82 mmHg. The mild chronic vessel ischemic changes seen on your MRI are likely related to your age and hypertension.  -SEIZURE DISORDER: You have a history of seizures in 2018 but have not had any since. You are currently taking lamotrigine to prevent seizures, and a recent EEG showed no seizure activity, indicating that your condition is stable.  -ASTHMA: Asthma is a condition where your airways narrow and swell, making it difficult to breathe. There have been no recent changes in your asthma symptoms, and you have not reported any acute exacerbations.  -ANEMIA: Anemia is a condition where you lack enough healthy red blood cells to carry adequate oxygen to your body's tissues, contributing to your shortness of breath. There have been no recent changes in your symptoms.  -GENERAL HEALTH MAINTENANCE: We discussed the possibility of seasonal allergies contributing to  your symptoms. You have not tried allergy medications before. We recommend trying a nasal steroid spray first, and if that does not help, you can try an allergy pill like Claritin.  INSTRUCTIONS:  Please use the prescribed nasal steroid spray (fluticasone) as directed. If your symptoms do not improve or worsen, consider following up with an ENT or neurology specialist. Continue taking your current medications for hypertension, seizures, asthma, and anemia as prescribed. If the nasal steroid spray does not alleviate your symptoms, you may try an allergy pill like Claritin. Follow up with [place] if you have any new or worsening symptoms.

## 2024-03-13 NOTE — Progress Notes (Signed)
 Assessment/Plan:   Tinnitus worsened by seasonal allergies? Intermittent ringing in the right ear with associated eye and nasal bridge tightness. Symptoms present since late last year or early this year. Previous ENT evaluation suggested sensorineural hearing loss and recommended a hearing aid, but he is skeptical. MRI showed no acute intracranial issues or auditory canal masses. Mild chronic vessel ischemic changes likely due to age and hypertension. Tinnitus is complex and difficult to treat, and its relation to eye and nasal symptoms is unclear. No evidence of stroke or acute neurological issues. ENT or neurology are the specialists for tinnitus management. He prefers to hold off on a hearing aid, trusting his intuition that it may not be the correct solution. - Prescribe nasal steroid spray (fluticasone) to address potential eustachian tube dysfunction and associated symptoms. - Consider follow-up with ENT or neurology for further evaluation if symptoms persist or worsen. - Provide reassurance that there is no evidence of stroke or acute neurological issues.  Hypertension Hypertension well-managed with medication. Blood pressure is 138/82 mmHg. Mild chronic vessel ischemic changes on MRI likely related to hypertension and age.  Seizure Disorder Seizures in 2018 with no recurrence since. Currently on lamotrigine for seizure prophylaxis. Recent EEG showed no seizure activity, indicating stable condition.  Asthma Asthma with no recent changes in symptoms. No acute exacerbations reported.  Anemia Anemia contributing to shortness of breath. No recent changes in symptoms reported.  General Health Maintenance Potential seasonal allergies contributing to symptoms. He has not tried allergy medications previously. Discussed low risk of trying a nasal steroid spray or an allergy pill like Claritin to alleviate symptoms. - Recommend trying an allergy pill like Claritin if nasal steroid spray does  not alleviate symptoms.         There are no discontinued medications.  Return if symptoms worsen or fail to improve.    Subjective:   Encounter date: 03/13/2024  [name] [name] is a 73 y.o. male who has TINEA CRURIS; Hypogonadism, male; Vitamin D deficiency; IRON DEFIC ANEMIA SEC DIET IRON INTAKE; FOLATE-DEFICIENCY ANEMIA; ERECTILE DYSFUNCTION; Essential hypertension, benign; Atrial fibrillation (HCC); Asthma; ESOPHAGEAL MOTILITY DISORDER; GERD; Sleep apnea; Other fatigue; Abnormal involuntary movement; Sick sinus syndrome (HCC); Seizures (HCC); Gynecomastia; Anemia; Hypertensive heart disease; Sepsis (HCC); COPD (chronic obstructive pulmonary disease) (HCC); Benign prostatic hyperplasia; History of gastroesophageal reflux (GERD); History of asthma; History of COPD; Elevated sed rate; Syncope; Dislocated shoulder; UTI (urinary tract infection); Chronic left shoulder pain; Gout; Alteration consciousness; and Tinnitus on their problem list..   He  has a past medical history of Anemia, Asthma, Atrial fibrillation (HCC), CHF (congestive heart failure) (HCC), COPD (chronic obstructive pulmonary disease) (HCC), Heart disease, Hypertension, and Syncope.Marland Kitchen   He presents with chief complaint of Medical Management of Chronic Issues ( tightening across the eyes, and also a ringing in the right ear possible and more frequently happening and it comes and goes through out the day, the tightening on the eye area feels like a rubber band being pulled) .   Discussed the use of AI scribe software for clinical note transcription with the patient, who gave verbal consent to proceed.  History of Present Illness   [name] [name] is a 73 year old male who presents with ringing in the right ear and facial tightness.  He experiences persistent tinnitus in the right ear, which sometimes intensifies and is associated with a tightening sensation across the eyes and nose, occasionally triggering tearing. These  symptoms have been present since late last year  or early this year. He previously consulted an ENT specialist in July 2024, who recommended a hearing aid due to differences in sound perception between his ears, though he does not perceive any hearing difficulties.  He is concerned about the possibility of having had a stroke or being at risk for one, given his symptoms and history of atrial fibrillation and heart disease. He is on aspirin and digoxin for heart management.  He has a past medical history of seizures, having experienced three to four seizures in 2018, but has not had any since. He recalls an incident where he fell and injured his left shoulder during a seizure. He is currently on lamotrigine for seizure prophylaxis and takes blood pressure medication.  He mentions being anemic and asthmatic, which contributes to his shortness of breath. No recent changes in these conditions. No recent confusion, weakness, dizziness, blurry vision, chest pain, or shortness of breath beyond his known conditions.       ROS  Past Surgical History:  Procedure Laterality Date   CARDIAC CATHETERIZATION N/A 12/08/2015   Procedure: Left Heart Cath and Coronary Angiography;  Surgeon: Rinaldo Cloud, MD;  Location: [facility] INVASIVE CV LAB;  Service: Cardiovascular;  Laterality: N/A;   ESOPHAGUS SURGERY     SHOULDER CLOSED REDUCTION Left 12/01/2017   Procedure: CLOSED REDUCTION SHOULDER;  Surgeon: Myrene Galas, MD;  Location: [facility] OR;  Service: Orthopedics;  Laterality: Left;    Outpatient Medications Prior to Visit  Medication Sig Dispense Refill   acetaminophen (TYLENOL) 325 MG tablet Take 2 tablets (650 mg total) by mouth every 6 (six) hours as needed for mild pain (or Fever >/= 101). 30 tablet 0   albuterol (PROVENTIL HFA;VENTOLIN HFA) 108 (90 Base) MCG/ACT inhaler Inhale 2 puffs into the lungs every 6 (six) hours as needed. 1 Inhaler 5   albuterol (PROVENTIL) (2.5 MG/3ML) 0.083% nebulizer solution Take 3 mLs  (2.5 mg total) by nebulization every 6 (six) hours as needed for wheezing or shortness of breath. 150 mL 1   allopurinol (ZYLOPRIM) 300 MG tablet Take 1 tablet (300 mg total) by mouth daily. 90 tablet 1   amiodarone (PACERONE) 200 MG tablet Take 1 tablet (200 mg total) by mouth 2 (two) times daily. 60 tablet 2   ARNUITY ELLIPTA 100 MCG/ACT AEPB Inhale 1 puff into the lungs daily. 30 each 3   aspirin EC 81 MG tablet Take 81 mg by mouth daily.     atorvastatin (LIPITOR) 40 MG tablet Take 1 tablet (40 mg total) by mouth daily. 90 tablet 1   clotrimazole-betamethasone (LOTRISONE) cream Apply 1 application topically 2 (two) times daily as needed (FOR RASH).     diclofenac Sodium (VOLTAREN) 1 % GEL Apply 4 g topically 4 (four) times daily. 100 g 1   digoxin (LANOXIN) 0.125 MG tablet Take 1 tablet (0.125 mg total) by mouth daily. 30 tablet 3   empagliflozin (JARDIANCE) 10 MG TABS tablet Take 1 tablet (10 mg total) by mouth daily before breakfast. 90 tablet 1   ferrous sulfate 325 (65 FE) MG tablet Take 325 mg by mouth daily with breakfast. Reported on 03/16/2016     furosemide (LASIX) 20 MG tablet TAKE 1 TABLET BY MOUTH EVERY DAY 90 tablet 1   lamoTRIgine (LAMICTAL) 100 MG tablet TAKE 1 TABLET BY MOUTH TWICE A DAY 180 tablet 3   losartan (COZAAR) 100 MG tablet Take 100 mg by mouth daily.  3   metoprolol succinate (TOPROL-XL) 25 MG 24 hr tablet Take 1  tablet (25 mg total) by mouth daily. 90 tablet 1   nitroGLYCERIN (NITROSTAT) 0.4 MG SL tablet Place 1 tablet (0.4 mg total) under the tongue every 5 (five) minutes as needed for chest pain. 25 tablet 1   pantoprazole (PROTONIX) 40 MG tablet TAKE 1 TABLET BY MOUTH EVERY DAY 90 tablet 1   potassium chloride (K-DUR) 10 MEQ tablet Take 10 mEq by mouth daily.     tacrolimus (PROTOPIC) 0.1 % ointment      tamsulosin (FLOMAX) 0.4 MG CAPS capsule Take 1 capsule (0.4 mg total) by mouth daily. 90 capsule 1   No facility-administered medications prior to visit.     Family History  Problem Relation Age of Onset   Cancer Mother     Social History   Socioeconomic History   Marital status: Single    Spouse name: Not on file   Number of children: 1   Years of education: college   Highest education level: Not on file  Occupational History   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    Employer: [employer]  Tobacco Use   Smoking status: Never   Smokeless tobacco: Never  Vaping Use   Vaping status: Never Used  Substance and Sexual Activity   Alcohol use: No    Alcohol/week: 0.0 standard drinks of alcohol   Drug use: No   Sexual activity: Not Currently  Other Topics Concern   Not on file  Social History Narrative   Patient lives at home alone and he is single.   Patient is semi retired.    Education college.   Right handed.   Caffeine two cokes daily.   Social Drivers of Corporate investment banker Strain: Low Risk  (03/26/2023)   Overall Financial Resource Strain (CARDIA)    Difficulty of Paying Living Expenses: Not hard at all  Food Insecurity: Low Risk  (07/24/2023)   Received from [facility]   Hunger Vital Sign    Worried About Running Out of Food in the Last Year: Never true    Ran Out of Food in the Last Year: Never true  Transportation Needs: Not on file (07/24/2023)  Physical Activity: Inactive (03/26/2023)   Exercise Vital Sign    Days of Exercise per Week: 0 days    Minutes of Exercise per Session: 0 min  Stress: No Stress Concern Present (03/26/2023)   [employer] of Occupational Health - Occupational Stress Questionnaire    Feeling of Stress : Not at all  Social Connections: Socially Isolated (03/26/2023)   Social Connection and Isolation Panel [NHANES]    Frequency of Communication with Friends and Family: Never    Frequency of Social Gatherings with Friends and Family: Never    Attends Religious Services: 1 to 4 times per year    Active Member of [employer] or Organizations: No    Attends Banker Meetings: Never     Marital Status: Never married  Intimate Partner Violence: Not At Risk (03/26/2023)   Humiliation, Afraid, Rape, and Kick questionnaire    Fear of Current or Ex-Partner: No    Emotionally Abused: No    Physically Abused: No    Sexually Abused: No  Objective:  Physical Exam: BP 138/82   Pulse 77   Temp 97.9 F (36.6 C) (Temporal)   Wt 231 lb 9.6 oz (105.1 kg)   SpO2 99%   BMI 36.27 kg/m     Physical Exam Constitutional:      Appearance: Normal appearance.  HENT:     Head: Normocephalic and atraumatic.     Right Ear: Hearing, tympanic membrane, ear canal and external ear normal.     Left Ear: Hearing, tympanic membrane, ear canal and external ear normal.     Nose: Nose normal.  Eyes:     General: Vision grossly intact. Gaze aligned appropriately. No scleral icterus.       Right eye: No discharge.        Left eye: No discharge.     Extraocular Movements: Extraocular movements intact.     Conjunctiva/sclera: Conjunctivae normal.     Pupils: Pupils are equal, round, and reactive to light.     Visual Fields: Right eye visual fields normal and left eye visual fields normal.  Cardiovascular:     Rate and Rhythm: Normal rate and regular rhythm.     Heart sounds: Normal heart sounds.  Pulmonary:     Effort: Pulmonary effort is normal.     Breath sounds: Normal breath sounds.  Abdominal:     Palpations: Abdomen is soft.     Tenderness: There is no abdominal tenderness.  Skin:    General: Skin is warm.     Findings: No rash.  Neurological:     General: No focal deficit present.     Mental Status: He is alert.     Cranial Nerves: Cranial nerves 2-12 are intact. No cranial nerve deficit.     Sensory: Sensation is intact.     Motor: Motor function is intact.     Coordination: Coordination is intact.     Gait: Gait is intact.  Psychiatric:        Mood and Affect: Affect is not  inappropriate.        Behavior: Behavior is cooperative.        Thought Content: Thought content normal.     No results found.  Recent Results (from the past 2160 hours)  Hemoglobin A1c     Status: Abnormal   Collection Time: 02/01/24  9:06 AM  Result Value Ref Range   Hgb A1c MFr Bld 5.9 (H) 4.8 - 5.6 %    Comment:          Prediabetes: 5.7 - 6.4          Diabetes: >6.4          Glycemic control for adults with diabetes: <7.0    Est. average glucose Bld gHb Est-mCnc 123 mg/dL        Garner Nash, MD, MS

## 2024-04-06 ENCOUNTER — Ambulatory Visit (HOSPITAL_BASED_OUTPATIENT_CLINIC_OR_DEPARTMENT_OTHER): Payer: 59 | Attending: Family Medicine | Admitting: Internal Medicine

## 2024-04-06 DIAGNOSIS — R29818 Other symptoms and signs involving the nervous system: Secondary | ICD-10-CM

## 2024-04-06 DIAGNOSIS — F519 Sleep disorder not due to a substance or known physiological condition, unspecified: Secondary | ICD-10-CM | POA: Insufficient documentation

## 2024-04-06 DIAGNOSIS — G478 Other sleep disorders: Secondary | ICD-10-CM | POA: Diagnosis not present

## 2024-04-06 DIAGNOSIS — Z6836 Body mass index (BMI) 36.0-36.9, adult: Secondary | ICD-10-CM | POA: Diagnosis not present

## 2024-04-20 DIAGNOSIS — R29818 Other symptoms and signs involving the nervous system: Secondary | ICD-10-CM | POA: Diagnosis not present

## 2024-04-20 NOTE — Procedures (Signed)
 Maryan Smalling [facility] [address] [place], [place] [address] Tel: [phone]   Fax: [phone]  Polysomnography Interpretation  Patient Name:  [name], [name] Date:  04/06/2024 Referring Physician:  Dr. Joaquin Mulberry  Indications for Polysomnography The patient is a 73 year old Male who is 5\' 7"  and weighs 235.0 lbs. His BMI equals 36.9.  A full night polysomnogram was performed to evaluate for -.OSA  Medication  None reported   Polysomnogram Data A full night polysomnogram recorded the standard physiologic parameters including EEG, EOG, EMG, EKG, nasal and oral airflow.  Respiratory parameters of chest and abdominal movements were recorded with Respiratory Inductance Plethysmography belts.  Oxygen  saturation was recorded by pulse oximetry.   Sleep Architecture The total recording time of the polysomnogram was 359.7 minutes.  The total sleep time was 165.5 minutes.  The patient spent 41.4% of total sleep time in Stage N1, 58.6% in Stage N2, 0.0% in Stages N3, and 0.0% in REM.  Sleep latency was 64.5 minutes.  REM latency was - minutes.  Sleep Efficiency was 46.0%.  Wake after Sleep Onset time was 129.5 minutes.  Respiratory Events The polysomnogram revealed a presence of - obstructive, - central, and - mixed apneas resulting in an Apnea index of - events per hour.  There were 10 hypopneas (>=3% desaturation and/or arousal) resulting in an Apnea\Hypopnea Index (AHI >=3% desaturation and/or arousal) of 3.6 events per hour.  There were 6 hypopneas (>=4% desaturation) resulting in an Apnea\Hypopnea Index (AHI >=4% desaturation) of 2.2 events per hour.  There were 8 Respiratory Effort Related Arousals resulting in a RERA index of 2.9 events per hour. The Respiratory Disturbance Index is 6.5 events per hour.  The snore index was - events per hour.  Mean oxygen  saturation was 96.5%.  The lowest oxygen  saturation during sleep was 88.0%.  Time spent <=88%  oxygen  saturation was 1.9 minutes (0.5%).  Limb Activity There were 1 total limb movements recorded, of this total, - were classified as PLMs.  PLM index was - per hour and PLM associated with Arousals index was - per hour.  Cardiac Summary The average pulse rate was 77.5 bpm.  The minimum pulse rate was 54.0 bpm while the maximum pulse rate was 130.0 bpm.  Cardiac rhythm was normal but with intervals of sinus tachycardia, possible atrial flutter, up to130/minute..  Comment: Occasional hypopneas, within normal limits AHI (4%) 2.2/hr. Snoring with oxygen  desaturation to a nadir of 88% and mean 96.5%. Difficulty initiating and maintaining sleep with frequent spontaneous arousals and awakenings. Nocturia x 2. Slept with TV on as per home habit. Study was ended at 04:30 AM so he could be at work by [employer].  Diagnosis: other sleep disorder  Recommendation: Attention to sleep hygiene. Manage for insomnia if appropriate. Consider assessing for arrhythmia. See tech comments below.   Maryan Smalling [facility] [address] [place], [place] [address] Tel: [phone]   Fax: [phone] [name] Date 04/20/24  3:18  Diagnostic PSG Report  Patient Name: [name], [name] Date: 04/06/2024  Date of Birth: 05-03-1951 Study Type: Diagnostic  Age: 45 year MRN #: [id number]  Sex: Male Interpreting Physician: Rosa College [id number]  Height: 5\' 7"  Referring Physician: Dr. Joaquin Mulberry  Weight: 235.0 lbs Recording Tech: Shawnee Heugly RPSGT RST  BMI: 36.9 Scoring Tech: -  ESS: 9 Neck Size: 16.25   Study Overview  Lights Off: 10:34:24 PM  Count Index  Lights On: 04:34:07 AM  Awakenings: 53 19.2  Time in Bed: 359.7 min. Arousals: 109 39.5  Total Sleep Time: 165.5 min. AHI (>=3% Desat and/or Ar.): 10 3.6   Sleep Efficiency: 46.0% AHI (>=4% Desat): 6 2.2   Sleep Latency: 64.5 min. Limb Movements: 1 0.4  Wake After Sleep Onset: 129.5 min. Snore: - -  REM Latency  from Sleep Onset: - min. Desaturations: 10 3.6     Minimum SpO2 TST: 88.0%    Sleep Architecture  % of Time in Bed Stages Time (mins) % Sleep Time  Wake 195.0   Stage N1 68.5 41.4%  Stage N2 97.0 58.6%  Stage N3 0.0 0.0%  REM 0.0 0.0%   Arousal Summary   NREM REM Sleep Index  Respiratory Arousals 13 - 13 4.7  PLM Arousals - - - -  Isolated Limb Movement Arousals - - - -  Snore Arousals - - - -  Spontaneous Arousals 96 - 96 34.8  Total 109 - 109 39.5   Limb Movement Summary   Count Index  Isolated Limb Movements 1 0.4  Periodic Limb Movements (PLMs) - -  Total Limb Movements 1 0.4    Respiratory Summary   By Sleep Stage By Body Position Total   NREM REM Supine Non-Supine   Time (min) 165.5 0.0 56.5 109.0 165.5         Obstructive Apnea - - - - -  Mixed Apnea - - - - -  Central Apnea - - - - -  Total Apneas - - - - -  Total Apnea Index - - - - -         Hypopneas (>=3% Desat and/or Ar.) 10 - 7 3 10   AHI (>=3% Desat and/or Ar.) 3.6 - 7.4 1.7 3.6         Hypopneas (>=4% Desat) 6 - 3 3 6   AHI (>=4% Desat) 2.2 - 3.2 1.7 2.2          RERAs 8 - 7 1 8   RERA Index 2.9 - 7.4 0.6 2.9         RDI 6.5 - 14.9 2.2 6.5    Respiratory Event Type Index  Central Apneas -  Obstructive Apneas -  Mixed Apneas -  Central Hypopneas -  Obstructive Hypopneas 0.4  Central Apnea + Hypopnea (CAHI) -  Obstructive Apnea + Hypopnea (OAHI) 3.6   Respiratory Event Durations   Apnea Hypopnea   NREM REM NREM REM  Average (seconds) - - 21.5 -  Maximum (seconds) - - 35.6 -    Oxygen  Saturation Summary   Wake NREM REM TST TIB  Average SpO2 (%) 96.9% 96.1% - 96.1% 96.5%  Minimum SpO2 (%) 80.0% 88.0% - 88.0% 80.0%  Maximum SpO2 (%) 100.0% 100.0% - 100.0% 100.0%   Oxygen  Saturation Distribution  Range (%) Time in range (min) Time in range (%)  90.0 - 100.0 343.3 99.2%  80.0 - 90.0 2.7 0.8%  70.0 - 80.0 0.1 0.0%  60.0 - 70.0 - -  50.0 - 60.0 - -  0.0 - 50.0 - -  Time Spent  <=88% SpO2  Range (%) Time in range (min) Time in range (%)  0.0 - 88.0 1.9 0.5%      Count Index  Desaturations 10 3.6    Cardiac Summary   Wake NREM REM Sleep Total  Average Pulse Rate (BPM) 74.1 81.1 - 81.1 77.5  Minimum Pulse Rate (BPM) 54.0 56.0 - 56.0 54.0  Maximum Pulse Rate (BPM) 130.0 120.0 -  120.0 130.0   Pulse Rate Distribution:  Range (bpm) Time in range (min) Time in range (%)  0.0 - 40.0 - -  40.0 - 60.0 16.1 4.7%  60.0 - 80.0 186.6 53.9%  80.0 - 100.0 135.7 39.2%  100.0 - 120.0 7.1 2.1%  120.0 - 140.0 0.7 0.2%  140.0 - 200.0 - -   EtCO2 Summary  Stage Min (mmHg) Average (mmHg) Max (mmHg)  Wake - - -  NREM(1+2+3) - - -  REM - - -   EtCO2 Distribution:  Range (mmHg) Time in range (min) Time in range (%)  20.0 - 40.0 - -  40.0 - 50.0 - -  50.0 - 100.0 - -  55.0 - 100.0 - -  Excluded data <20.0 & >65.0 360.5 100.0%     Hypnograms                         Technologist Comments  The patient is a 73 year old male with a past medical history of seizures, sick sinus syndrome, asthma, atrial fibrillation, hypertension, anemia, GERD, OSA, COPD, syncope, tinnitus, eustachian tube dysfunction, allergic rhinitis, gout, hypogonadism, vitamin D  deficiency, and gynecomastia.  He arrives for potential split night polysomnogram.  No medication taken in the sleep lab.  He reports a history of early morning awakening around 3-4 AM every day as well as several awakenings during the night to urinate.  He has used CPAP in the past and did not like it however he finds himself needing to nap and losing alertness mid-morning and he is aware he needs to be alert for his job and to be safe on the road.  He was oriented to room 5, the ordered procedure was explained in detail, all questions were answered.  The patient reported he doesn't like CPAP because he has "a strong phobia of having something on my face".  The patient's EEG was very difficult to discern  with excessive fast activity throughout.  High frequency filters had to be adjusted from 35 to 20 just to try to see sleep.  Sleep architecture was fragmented with long sleep latency and early morning awakening.  The patient slept on his left side and also supine.  Restless legs were seen during wake periods but limb movements were better after sleep was established.  Snoring was occasional and loud (especially while supine).  The patient showed upper airway resistance, RERAs, and hypopneas.  Many events showed a 3% oxygen  desaturation association.  The ECG was abnormal.  There were episodes of tachycardia reaching 130's.  The patient showed atrial flutter, arrhythmia.  ECG patches were replaced and repositioned without any discernable improvement in signal.  Oximeter probe was replaced for an artifactual high reading during the first part of study.  Nocturia occurred twice.  The patient slept with TV on as he usually does at home.  He required to leave as early as possible because he has to be at work at 0600.  The study was ended at 4:30 AM as soon as six hours of recording time was completed.  -TECH                        Ariyannah Pauling Garret Kales, Biomedical engineer of Sleep Medicine  ELECTRONICALLY SIGNED ON:  04/20/2024, 3:03 PM [facility] PH: [phone]   FX: [phone] ACCREDITED BY THE AMERICAN ACADEMY OF SLEEP MEDICINE

## 2024-04-24 ENCOUNTER — Ambulatory Visit: Payer: 59 | Admitting: Podiatry

## 2024-04-29 ENCOUNTER — Encounter: Payer: Self-pay | Admitting: Podiatry

## 2024-04-29 ENCOUNTER — Ambulatory Visit (INDEPENDENT_AMBULATORY_CARE_PROVIDER_SITE_OTHER): Admitting: Podiatry

## 2024-04-29 DIAGNOSIS — M79675 Pain in left toe(s): Secondary | ICD-10-CM | POA: Diagnosis not present

## 2024-04-29 DIAGNOSIS — B351 Tinea unguium: Secondary | ICD-10-CM | POA: Diagnosis not present

## 2024-04-29 DIAGNOSIS — M79674 Pain in right toe(s): Secondary | ICD-10-CM

## 2024-04-29 NOTE — Progress Notes (Signed)
 This patient presents to the office with chief complaint of long thick painful nails.  Patient says the nails are painful walking and wearing shoes.  This patient is unable to self treat.  This patient is unable to trim hisnails since she is unable to reach his nails.  he presents to the office for preventative foot care services.  General Appearance  Alert, conversant and in no acute stress.  Vascular  Dorsalis pedis and posterior tibial  pulses are palpable  bilaterally.  Capillary return is within normal limits  bilaterally. Temperature is within normal limits  bilaterally.  Neurologic  Senn-Weinstein monofilament wire test within normal limits  bilaterally. Muscle power within normal limits bilaterally.  Nails Thick disfigured discolored nails with subungual debris  from hallux to fifth toes bilaterally. No evidence of bacterial infection or drainage bilaterally.  Orthopedic  No limitations of motion  feet .  No crepitus or effusions noted.  No bony pathology or digital deformities noted.  Skin  normotropic skin with no porokeratosis noted bilaterally.  No signs of infections or ulcers noted.     Onychomycosis  Nails  B/L.  Pain in right toes  Pain in left toes  Debridement of nails both feet followed trimming the nails with dremel tool.    RTC 3 months.   Helane Gunther DPM

## 2024-06-12 ENCOUNTER — Ambulatory Visit (INDEPENDENT_AMBULATORY_CARE_PROVIDER_SITE_OTHER): Payer: Medicare HMO | Admitting: Urology

## 2024-06-12 ENCOUNTER — Encounter: Payer: Self-pay | Admitting: Urology

## 2024-06-12 VITALS — BP 139/80 | HR 85 | Ht 67.0 in | Wt 230.0 lb

## 2024-06-12 DIAGNOSIS — N138 Other obstructive and reflux uropathy: Secondary | ICD-10-CM | POA: Diagnosis not present

## 2024-06-12 DIAGNOSIS — N401 Enlarged prostate with lower urinary tract symptoms: Secondary | ICD-10-CM | POA: Diagnosis not present

## 2024-06-12 LAB — MICROSCOPIC EXAMINATION

## 2024-06-12 LAB — URINALYSIS, ROUTINE W REFLEX MICROSCOPIC
Bilirubin, UA: NEGATIVE
Glucose, UA: NEGATIVE
Ketones, UA: NEGATIVE
Leukocytes,UA: NEGATIVE
Nitrite, UA: NEGATIVE
Protein,UA: NEGATIVE
Specific Gravity, UA: 1.02 (ref 1.005–1.030)
Urobilinogen, Ur: 0.2 mg/dL (ref 0.2–1.0)
pH, UA: 5.5 (ref 5.0–7.5)

## 2024-06-12 NOTE — Progress Notes (Signed)
   Assessment: 1. BPH with obstruction/lower urinary tract symptoms     Plan: I personally reviewed the patient's chart including provider notes, and lab results. Continue tamsulosin  Return to office in 1 year  Chief Complaint: Chief Complaint  Patient presents with   Benign Prostatic Hypertrophy    HPI: [name] [name] is a 73 y.o. male who presents for continued evaluation of BPH with lower urinary tract symptoms. He was previously followed by Dr. Del Favia and was last seen in June 2024. At his initial visit in February 2024, he reported worsening lower urinary tract symptoms over the past several years.  He had been started on tamsulosin  by his PCP and noted significant benefit with improved force of stream and improved bladder emptying.  He continued to have some bothersome frequency. IPSS = 11. PVR = 15 mL. He was continued on tamsulosin . He was doing well at the time of his visit in June 2024.  His lower urinary tract symptoms had improved. IPSS = 6.  He uses sildenafil  for ED. Last PSA 11/2020 = 0.59.   He returns today for follow-up.  He continues on tamsulosin .  His lower inner tract symptoms are stable.  He does have nocturia x 2 and occasional urgency.  No dysuria or gross hematuria. IPSS = 5/1.  Portions of the above documentation were copied from a prior visit for review purposes only.  Allergies: No Known Allergies  PMH: Past Medical History:  Diagnosis Date   Anemia    Asthma    Atrial fibrillation (HCC)    CHF (congestive heart failure) (HCC)    COPD (chronic obstructive pulmonary disease) (HCC)    Heart disease    Hypertension    Syncope     PSH: Past Surgical History:  Procedure Laterality Date   CARDIAC CATHETERIZATION N/A 12/08/2015   Procedure: Left Heart Cath and Coronary Angiography;  Surgeon: Chapman Commodore, MD;  Location: [facility];  Service: Cardiovascular;  Laterality: N/A;   ESOPHAGUS SURGERY     SHOULDER CLOSED REDUCTION Left  12/01/2017   Procedure: CLOSED REDUCTION SHOULDER;  Surgeon: Hardy Lia, MD;  Location: [facility] OR;  Service: Orthopedics;  Laterality: Left;    SH: Social History   Tobacco Use   Smoking status: Never   Smokeless tobacco: Never  Vaping Use   Vaping status: Never Used  Substance Use Topics   Alcohol use: No    Alcohol/week: 0.0 standard drinks of alcohol   Drug use: No    ROS: Constitutional:  Negative for fever, chills, weight loss CV: Negative for chest pain, previous MI, hypertension Respiratory:  Negative for shortness of breath, wheezing, sleep apnea, frequent cough GI:  Negative for nausea, vomiting, bloody stool, GERD  PE: BP 139/80   Pulse 85   Ht 5' 7 (1.702 m)   Wt 230 lb (104.3 kg)   BMI 36.02 kg/m  GENERAL APPEARANCE:  Well appearing, well developed, well nourished, NAD HEENT:  Atraumatic, normocephalic, oropharynx clear NECK:  Supple without lymphadenopathy or thyromegaly ABDOMEN:  Soft, non-tender, no masses EXTREMITIES:  Moves all extremities well, without clubbing, cyanosis, or edema NEUROLOGIC:  Alert and oriented x 3, normal gait, CN II-XII grossly intact MENTAL STATUS:  appropriate BACK:  Non-tender to palpation, No CVAT SKIN:  Warm, dry, and intact GU: Prostate: 40 g, NT, no nodules Rectum: Normal tone,  no masses or tenderness   Results: U/A: 0-5 WBC, 0-2 RBC

## 2024-07-10 ENCOUNTER — Ambulatory Visit: Attending: Family Medicine | Admitting: Family Medicine

## 2024-07-10 ENCOUNTER — Encounter: Payer: Self-pay | Admitting: Family Medicine

## 2024-07-10 VITALS — BP 127/78 | HR 58 | Wt 220.2 lb

## 2024-07-10 DIAGNOSIS — I5032 Chronic diastolic (congestive) heart failure: Secondary | ICD-10-CM | POA: Diagnosis not present

## 2024-07-10 DIAGNOSIS — H9311 Tinnitus, right ear: Secondary | ICD-10-CM | POA: Diagnosis not present

## 2024-07-10 DIAGNOSIS — N401 Enlarged prostate with lower urinary tract symptoms: Secondary | ICD-10-CM

## 2024-07-10 DIAGNOSIS — J3489 Other specified disorders of nose and nasal sinuses: Secondary | ICD-10-CM

## 2024-07-10 DIAGNOSIS — R569 Unspecified convulsions: Secondary | ICD-10-CM

## 2024-07-10 DIAGNOSIS — I110 Hypertensive heart disease with heart failure: Secondary | ICD-10-CM

## 2024-07-10 DIAGNOSIS — K219 Gastro-esophageal reflux disease without esophagitis: Secondary | ICD-10-CM

## 2024-07-10 DIAGNOSIS — J438 Other emphysema: Secondary | ICD-10-CM

## 2024-07-10 DIAGNOSIS — I495 Sick sinus syndrome: Secondary | ICD-10-CM

## 2024-07-10 MED ORDER — EMPAGLIFLOZIN 10 MG PO TABS: 10.0000 mg | ORAL_TABLET | Freq: Every day | ORAL | 1 refills | Status: DC

## 2024-07-10 MED ORDER — ATORVASTATIN CALCIUM 40 MG PO TABS: 40.0000 mg | ORAL_TABLET | Freq: Every day | ORAL | 1 refills | Status: DC

## 2024-07-10 MED ORDER — PANTOPRAZOLE SODIUM 40 MG PO TBEC: 40.0000 mg | DELAYED_RELEASE_TABLET | Freq: Every day | ORAL | 1 refills | Status: DC

## 2024-07-10 MED ORDER — FLUTICASONE PROPIONATE 50 MCG/ACT NA SUSP: 2.0000 | Freq: Every day | NASAL | 6 refills | Status: AC

## 2024-07-10 MED ORDER — TAMSULOSIN HCL 0.4 MG PO CAPS: 0.4000 mg | ORAL_CAPSULE | Freq: Every day | ORAL | 1 refills | Status: DC

## 2024-07-10 NOTE — Patient Instructions (Signed)
 VISIT SUMMARY:  Today, you were seen for persistent ringing in your right ear, facial tightness, occasional back discomfort, and episodes of shaking. We discussed your current symptoms, reviewed your medical history, and made a plan to address each of your concerns.  YOUR PLAN:  -TINNITUS: Tinnitus is the perception of noise or ringing in the ears. You should continue using earbuds as they may help reduce the perception of ringing.  -SINUSITIS: Sinusitis is inflammation of the sinuses that can cause pressure and tightness around the face. You have been prescribed Flonase  to help relieve the sinus pressure.  -SEIZURE DISORDER: A seizure disorder involves episodes of shaking or tremors. You should consult your neurologist for further evaluation and to discuss potential adjustments to your medication, Lamictal .  -HYPERTENSION: Hypertension is high blood pressure. Your blood pressure was initially high but improved to 127/78 upon recheck. No changes to your current blood pressure medications are needed at this time.  -PREDIABETES: Prediabetes is a condition where blood sugar levels are higher than normal but not high enough to be classified as diabetes. Your A1c is 5.9%, indicating prediabetes. Regular monitoring and lifestyle changes are important to manage this condition.  -GENERAL HEALTH MAINTENANCE: It is important to regularly monitor your kidney and liver function. You need to complete the fasting labs that were previously ordered, and we will schedule additional blood tests for cholesterol at a later date.  INSTRUCTIONS:  Please follow up with your neurologist to discuss your shaking episodes and potential adjustments to your medication. Additionally, complete the fasting labs for kidney and liver function as previously ordered, and we will schedule your cholesterol tests at a later date.

## 2024-07-10 NOTE — Progress Notes (Signed)
 Subjective:  Patient ID: [name] [name], male    DOB: 1951/07/09  Age: 73 y.o. MRN: [id number]  CC: Annual Exam (Patient has concerns about stamia //Patient still having ringing in ears, //Nasal pressure/discomfort/)     Discussed the use of AI scribe software for clinical note transcription with the patient, who gave verbal consent to proceed.  History of Present Illness [name] [name] is a 73 year old male with a history of paroxysmal A. fib, atrial flutter, diastolic congestive heart failure (EF 55-60% in 11/2017), CAD (status post cardiac cath in 11/2015 - mid LAD 20% stenosis, dist LAD 40% stenosis, Ost RCA to prox RCA 30% stenosis, normal LV systolic function), sick sinus syndrome, COPD, GERD, seizures (previously managed by [facility]), BPH, hypogonadism (managed by endocrine),Gout  who presents with persistent ringing in the right ear and facial tightness.  He experiences persistent ringing in the right ear, with intensity varying throughout the day. Earbuds sometimes alleviate the symptoms. An ear, nose, and throat specialist has previously evaluated him without further intervention.  He feels tightness around the cheekbones, nasal bridge, and eyes, without nasal congestion or postnasal drip. Sinus involvement is considered possible.   Episodes of localized shaking occur without warning, resembling localized seizures or tremors, and have not been discussed with his neurologist.  He remains adherent with his Lamictal .  His medical history includes prediabetes with a last A1c of 5.9. Current medications include atorvastatin , Jardiance , Lamictal , pantoprazole , and medications for benign prostatic hyperplasia and blood pressure. He confirmed taking his medications this morning. From a cardiac standpoint he has no chest pains and does have dyspnea on moderate exertion and continues to follow-up with his cardiologist. Fasting labs are ordered to/2025 but he never returned to have these labs  done.   Past Medical History:  Diagnosis Date   Anemia    Asthma    Atrial fibrillation (HCC)    CHF (congestive heart failure) (HCC)    COPD (chronic obstructive pulmonary disease) (HCC)    Heart disease    Hypertension    Syncope     Past Surgical History:  Procedure Laterality Date   CARDIAC CATHETERIZATION N/A 12/08/2015   Procedure: Left Heart Cath and Coronary Angiography;  Surgeon: Rober Chroman, MD;  Location: [facility];  Service: Cardiovascular;  Laterality: N/A;   ESOPHAGUS SURGERY     SHOULDER CLOSED REDUCTION Left 12/01/2017   Procedure: CLOSED REDUCTION SHOULDER;  Surgeon: Celena Sharper, MD;  Location: [facility] OR;  Service: Orthopedics;  Laterality: Left;    Family History  Problem Relation Age of Onset   Cancer Mother     Social History   Socioeconomic History   Marital status: Single    Spouse name: Not on file   Number of children: 1   Years of education: college   Highest education level: Associate degree: academic program  Occupational History   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    Employer: [employer]  Tobacco Use   Smoking status: Never   Smokeless tobacco: Never  Vaping Use   Vaping status: Never Used  Substance and Sexual Activity   Alcohol use: No    Alcohol/week: 0.0 standard drinks of alcohol   Drug use: No   Sexual activity: Not Currently  Other Topics Concern   Not on file  Social History Narrative   Patient lives at home alone and he is single.   Patient is semi retired.    Education college.   Right handed.   Caffeine  two cokes daily.   Social Drivers of Corporate investment banker Strain: Low Risk  (07/10/2024)   Overall Financial Resource Strain (CARDIA)    Difficulty of Paying Living Expenses: Not very hard  Recent Concern: Financial Resource Strain - Medium Risk (07/06/2024)   Overall Financial Resource Strain (CARDIA)    Difficulty of Paying Living Expenses: Somewhat hard  Food Insecurity: No Food Insecurity (07/06/2024)    Hunger Vital Sign    Worried About Running Out of Food in the Last Year: Never true    Ran Out of Food in the Last Year: Never true  Transportation Needs: No Transportation Needs (07/10/2024)   PRAPARE - Administrator, Civil Service (Medical): No    Lack of Transportation (Non-Medical): No  Physical Activity: Insufficiently Active (07/06/2024)   Exercise Vital Sign    Days of Exercise per Week: 2 days    Minutes of Exercise per Session: 30 min  Stress: No Stress Concern Present (07/10/2024)   [employer] of Occupational Health - Occupational Stress Questionnaire    Feeling of Stress: Not at all  Social Connections: Unknown (07/10/2024)   Social Connection and Isolation Panel    Frequency of Communication with Friends and Family: Twice a week    Frequency of Social Gatherings with Friends and Family: Once a week    Attends Religious Services: Not on Marketing executive or Organizations: Not on file    Attends Banker Meetings: Not on file    Marital Status: Not on file    No Known Allergies  Outpatient Medications Prior to Visit  Medication Sig Dispense Refill   acetaminophen  (TYLENOL ) 325 MG tablet Take 2 tablets (650 mg total) by mouth every 6 (six) hours as needed for mild pain (or Fever >/= 101). 30 tablet 0   albuterol  (PROVENTIL  HFA;VENTOLIN  HFA) 108 (90 Base) MCG/ACT inhaler Inhale 2 puffs into the lungs every 6 (six) hours as needed. 1 Inhaler 5   albuterol  (PROVENTIL ) (2.5 MG/3ML) 0.083% nebulizer solution Take 3 mLs (2.5 mg total) by nebulization every 6 (six) hours as needed for wheezing or shortness of breath. 150 mL 1   allopurinol  (ZYLOPRIM ) 300 MG tablet Take 1 tablet (300 mg total) by mouth daily. 90 tablet 1   amiodarone  (PACERONE ) 200 MG tablet Take 1 tablet (200 mg total) by mouth 2 (two) times daily. 60 tablet 2   ARNUITY ELLIPTA  100 MCG/ACT AEPB Inhale 1 puff into the lungs daily. 30 each 3   aspirin  EC 81 MG tablet Take  81 mg by mouth daily.     clotrimazole -betamethasone  (LOTRISONE ) cream Apply 1 application topically 2 (two) times daily as needed (FOR RASH).     diclofenac  Sodium (VOLTAREN ) 1 % GEL Apply 4 g topically 4 (four) times daily. 100 g 1   digoxin  (LANOXIN ) 0.125 MG tablet Take 1 tablet (0.125 mg total) by mouth daily. 30 tablet 3   ferrous sulfate  325 (65 FE) MG tablet Take 325 mg by mouth daily with breakfast. Reported on 03/16/2016     furosemide  (LASIX ) 20 MG tablet TAKE 1 TABLET BY MOUTH EVERY DAY 90 tablet 1   lamoTRIgine  (LAMICTAL ) 100 MG tablet TAKE 1 TABLET BY MOUTH TWICE A DAY 180 tablet 3   levocetirizine (XYZAL ) 5 MG tablet Take 1 tablet (5 mg total) by mouth every evening. 90 tablet 3   losartan (COZAAR) 100 MG tablet Take 100 mg by mouth daily.  3  metoprolol  succinate (TOPROL -XL) 25 MG 24 hr tablet Take 1 tablet (25 mg total) by mouth daily. 90 tablet 1   nitroGLYCERIN  (NITROSTAT ) 0.4 MG SL tablet Place 1 tablet (0.4 mg total) under the tongue every 5 (five) minutes as needed for chest pain. 25 tablet 1   potassium chloride  (K-DUR) 10 MEQ tablet Take 10 mEq by mouth daily.     tacrolimus (PROTOPIC) 0.1 % ointment      atorvastatin  (LIPITOR) 40 MG tablet Take 1 tablet (40 mg total) by mouth daily. 90 tablet 1   empagliflozin  (JARDIANCE ) 10 MG TABS tablet Take 1 tablet (10 mg total) by mouth daily before breakfast. 90 tablet 1   fluticasone  (FLONASE ) 50 MCG/ACT nasal spray Place 2 sprays into both nostrils daily. 16 g 6   pantoprazole  (PROTONIX ) 40 MG tablet TAKE 1 TABLET BY MOUTH EVERY DAY 90 tablet 1   tamsulosin  (FLOMAX ) 0.4 MG CAPS capsule Take 1 capsule (0.4 mg total) by mouth daily. 90 capsule 1   No facility-administered medications prior to visit.     ROS Review of Systems  Constitutional:  Negative for activity change and appetite change.  HENT:  Positive for tinnitus. Negative for sinus pressure and sore throat.   Respiratory:  Positive for cough. Negative for chest  tightness, shortness of breath and wheezing.   Cardiovascular:  Negative for chest pain and palpitations.  Gastrointestinal:  Negative for abdominal distention, abdominal pain and constipation.  Genitourinary: Negative.   Musculoskeletal: Negative.   Psychiatric/Behavioral:  Negative for behavioral problems and dysphoric mood.     Objective:  BP 127/78   Pulse (!) 58   Wt 220 lb 3.2 oz (99.9 kg)   SpO2 100%   BMI 34.49 kg/m      07/10/2024   11:44 AM 07/10/2024   10:25 AM 06/12/2024   10:55 AM  BP/Weight  Systolic BP 127 156 139  Diastolic BP 78 79 80  Wt. (Lbs)  220.2 230  BMI  34.49 kg/m2 36.02 kg/m2      Physical Exam Constitutional:      Appearance: He is well-developed.  HENT:     Right Ear: Tympanic membrane normal.     Left Ear: Tympanic membrane normal.  Cardiovascular:     Rate and Rhythm: Normal rate.     Heart sounds: Normal heart sounds. No murmur heard. Pulmonary:     Effort: Pulmonary effort is normal.     Breath sounds: Normal breath sounds. No wheezing or rales.  Chest:     Chest wall: No tenderness.  Abdominal:     General: Bowel sounds are normal. There is no distension.     Palpations: Abdomen is soft. There is no mass.     Tenderness: There is no abdominal tenderness.  Musculoskeletal:        General: Normal range of motion.     Right lower leg: No edema.     Left lower leg: No edema.  Neurological:     Mental Status: He is alert and oriented to person, place, and time.  Psychiatric:        Mood and Affect: Mood normal.        Latest Ref Rng & Units 01/25/2023   12:02 PM 07/25/2022    3:01 PM 03/08/2022   12:15 PM  CMP  Glucose 70 - 99 mg/dL 81  76  88   BUN 8 - 27 mg/dL 9  14  11    Creatinine 0.76 - 1.27 mg/dL 8.80  1.28  1.12   Sodium 134 - 144 mmol/L 143  142  140   Potassium 3.5 - 5.2 mmol/L 4.1  3.8  4.0   Chloride 96 - 106 mmol/L 104  103  102   CO2 20 - 29 mmol/L 26  27  27    Calcium  8.6 - 10.2 mg/dL 9.3  9.5  9.0   Total  Protein 6.0 - 8.5 g/dL 7.8  7.6  7.6   Total Bilirubin 0.0 - 1.2 mg/dL 0.2  <9.7  0.4   Alkaline Phos 44 - 121 IU/L 113  93  102   AST 0 - 40 IU/L 21  20  24    ALT 0 - 44 IU/L 14  10  14      Lipid Panel     Component Value Date/Time   CHOL 146 10/07/2021 1017   TRIG 85 10/07/2021 1017   HDL 45 10/07/2021 1017   CHOLHDL 3.2 10/07/2021 1017   CHOLHDL 2.5 12/09/2015 0133   VLDL 11 12/09/2015 0133   LDLCALC 85 10/07/2021 1017    CBC    Component Value Date/Time   WBC 7.8 03/08/2022 1215   WBC 8.3 01/20/2019 0345   RBC 5.33 03/08/2022 1215   RBC 5.22 01/20/2019 0345   HGB 11.5 (L) 03/08/2022 1215   HCT 37.6 03/08/2022 1215   PLT 357 03/08/2022 1215   MCV 71 (L) 03/08/2022 1215   MCH 21.6 (L) 03/08/2022 1215   MCH 21.1 (L) 01/20/2019 0345   MCHC 30.6 (L) 03/08/2022 1215   MCHC 29.5 (L) 01/20/2019 0345   RDW 16.0 (H) 03/08/2022 1215   LYMPHSABS 2.4 03/08/2022 1215   MONOABS 1.2 (H) 12/01/2017 2242   EOSABS 0.6 (H) 03/08/2022 1215   BASOSABS 0.1 03/08/2022 1215    Lab Results  Component Value Date   HGBA1C 5.9 (H) 02/01/2024    Lab Results  Component Value Date   HGBA1C 5.9 (H) 02/01/2024   HGBA1C 5.6 07/25/2022   HGBA1C 5.8 (H) 10/07/2021      1. Hypertensive heart disease with chronic diastolic congestive heart failure (HCC) Euvolemic with a EF of 55 to 60% Continue to follow-up with cardiology - CMP14+EGFR - atorvastatin  (LIPITOR) 40 MG tablet; Take 1 tablet (40 mg total) by mouth daily.  Dispense: 90 tablet; Refill: 1 - empagliflozin  (JARDIANCE ) 10 MG TABS tablet; Take 1 tablet (10 mg total) by mouth daily before breakfast.  Dispense: 90 tablet; Refill: 1  2. Gastroesophageal reflux disease without esophagitis Controlled - pantoprazole  (PROTONIX ) 40 MG tablet; Take 1 tablet (40 mg total) by mouth daily.  Dispense: 90 tablet; Refill: 1  3. Benign prostatic hyperplasia with lower urinary tract symptoms, symptom details unspecified Stable Continue to  follow-up with urology - tamsulosin  (FLOMAX ) 0.4 MG CAPS capsule; Take 1 capsule (0.4 mg total) by mouth daily.  Dispense: 90 capsule; Refill: 1  4. Tinnitus of right ear (Primary) Counseled on pathophysiology of tinnitus Discussed use of earbuds to mitigate this   5. Sinus pressure - fluticasone  (FLONASE ) 50 MCG/ACT nasal spray; Place 2 sprays into both nostrils daily.  Dispense: 16 g; Refill: 6  6. Seizures (HCC) He does have monoclonal jerking and we will need to exclude seizures Currently on Lamictal  Advised to contact neurology for an upcoming appointment  7. Morbid obesity, unspecified obesity type (HCC) Reduce caloric intake, increase physical activity  8. Other emphysema (HCC) Stable  9. Sick sinus syndrome (HCC) Stable Continue to follow-up with cardiology   Meds ordered this encounter  Medications   atorvastatin  (LIPITOR) 40 MG tablet    Sig: Take 1 tablet (40 mg total) by mouth daily.    Dispense:  90 tablet    Refill:  1   empagliflozin  (JARDIANCE ) 10 MG TABS tablet    Sig: Take 1 tablet (10 mg total) by mouth daily before breakfast.    Dispense:  90 tablet    Refill:  1   pantoprazole  (PROTONIX ) 40 MG tablet    Sig: Take 1 tablet (40 mg total) by mouth daily.    Dispense:  90 tablet    Refill:  1   tamsulosin  (FLOMAX ) 0.4 MG CAPS capsule    Sig: Take 1 capsule (0.4 mg total) by mouth daily.    Dispense:  90 capsule    Refill:  1   fluticasone  (FLONASE ) 50 MCG/ACT nasal spray    Sig: Place 2 sprays into both nostrils daily.    Dispense:  16 g    Refill:  6    Follow-up: Return in about 6 months (around 01/10/2025) for Chronic medical conditions, cancel previous appointment.       Corrina Sabin, MD, FAAFP. [facility] [place], [place] [phone]   07/10/2024, 1:49 PM

## 2024-07-11 ENCOUNTER — Ambulatory Visit: Payer: Self-pay | Admitting: Family Medicine

## 2024-07-11 LAB — CMP14+EGFR
ALT: 15 IU/L (ref 0–44)
AST: 24 IU/L (ref 0–40)
Albumin: 3.9 g/dL (ref 3.8–4.8)
Alkaline Phosphatase: 90 IU/L (ref 44–121)
BUN/Creatinine Ratio: 11 (ref 10–24)
BUN: 16 mg/dL (ref 8–27)
Bilirubin Total: 0.4 mg/dL (ref 0.0–1.2)
CO2: 24 mmol/L (ref 20–29)
Calcium: 9.4 mg/dL (ref 8.6–10.2)
Chloride: 99 mmol/L (ref 96–106)
Creatinine, Ser: 1.5 mg/dL — ABNORMAL HIGH (ref 0.76–1.27)
Globulin, Total: 4.2 g/dL (ref 1.5–4.5)
Glucose: 88 mg/dL (ref 70–99)
Potassium: 3.5 mmol/L (ref 3.5–5.2)
Sodium: 141 mmol/L (ref 134–144)
Total Protein: 8.1 g/dL (ref 6.0–8.5)
eGFR: 48 mL/min/1.73 — ABNORMAL LOW (ref >59)

## 2024-07-17 ENCOUNTER — Other Ambulatory Visit: Payer: Self-pay | Admitting: Family Medicine

## 2024-07-17 DIAGNOSIS — I10 Essential (primary) hypertension: Secondary | ICD-10-CM

## 2024-07-30 ENCOUNTER — Ambulatory Visit: Admitting: Podiatry

## 2024-07-30 ENCOUNTER — Ambulatory Visit: Payer: Self-pay | Admitting: Family Medicine

## 2024-08-01 ENCOUNTER — Ambulatory Visit: Admitting: Podiatry

## 2024-08-05 ENCOUNTER — Encounter: Payer: Self-pay | Admitting: Podiatry

## 2024-08-05 ENCOUNTER — Ambulatory Visit (INDEPENDENT_AMBULATORY_CARE_PROVIDER_SITE_OTHER): Admitting: Podiatry

## 2024-08-05 DIAGNOSIS — M79675 Pain in left toe(s): Secondary | ICD-10-CM

## 2024-08-05 DIAGNOSIS — B351 Tinea unguium: Secondary | ICD-10-CM

## 2024-08-05 DIAGNOSIS — M79674 Pain in right toe(s): Secondary | ICD-10-CM

## 2024-08-05 NOTE — Progress Notes (Signed)
 This patient presents to the office with chief complaint of long thick painful nails.  Patient says the nails are painful walking and wearing shoes.  This patient is unable to self treat.  This patient is unable to trim hisnails since she is unable to reach his nails.  he presents to the office for preventative foot care services.  General Appearance  Alert, conversant and in no acute stress.  Vascular  Dorsalis pedis and posterior tibial  pulses are palpable  bilaterally.  Capillary return is within normal limits  bilaterally. Temperature is within normal limits  bilaterally.  Neurologic  Senn-Weinstein monofilament wire test within normal limits  bilaterally. Muscle power within normal limits bilaterally.  Nails Thick disfigured discolored nails with subungual debris  from hallux to fifth toes bilaterally. No evidence of bacterial infection or drainage bilaterally.  Orthopedic  No limitations of motion  feet .  No crepitus or effusions noted.  No bony pathology or digital deformities noted.  Skin  normotropic skin with no porokeratosis noted bilaterally.  No signs of infections or ulcers noted.     Onychomycosis  Nails  B/L.  Pain in right toes  Pain in left toes  Debridement of nails both feet followed trimming the nails with dremel tool.    RTC 3 months.   Helane Gunther DPM

## 2024-08-07 ENCOUNTER — Other Ambulatory Visit: Payer: Self-pay | Admitting: Family Medicine

## 2024-08-07 DIAGNOSIS — I110 Hypertensive heart disease with heart failure: Secondary | ICD-10-CM

## 2024-09-27 ENCOUNTER — Other Ambulatory Visit: Payer: Self-pay | Admitting: Family Medicine

## 2024-09-27 DIAGNOSIS — M1A0710 Idiopathic chronic gout, right ankle and foot, without tophus (tophi): Secondary | ICD-10-CM

## 2024-09-30 ENCOUNTER — Ambulatory Visit: Payer: Self-pay

## 2024-10-18 ENCOUNTER — Emergency Department (HOSPITAL_COMMUNITY)

## 2024-10-18 ENCOUNTER — Other Ambulatory Visit: Payer: Self-pay

## 2024-10-18 ENCOUNTER — Inpatient Hospital Stay (HOSPITAL_COMMUNITY)
Admission: EM | Admit: 2024-10-18 | Discharge: 2024-10-24 | DRG: 871 | Disposition: A | Discharge status: Home / Self Care | Attending: Internal Medicine | Admitting: Internal Medicine

## 2024-10-18 ENCOUNTER — Encounter (HOSPITAL_COMMUNITY): Payer: Self-pay

## 2024-10-18 DIAGNOSIS — M1A9XX0 Chronic gout, unspecified, without tophus (tophi): Secondary | ICD-10-CM | POA: Diagnosis present

## 2024-10-18 DIAGNOSIS — R55 Syncope and collapse: Secondary | ICD-10-CM | POA: Diagnosis present

## 2024-10-18 DIAGNOSIS — K219 Gastro-esophageal reflux disease without esophagitis: Secondary | ICD-10-CM | POA: Diagnosis present

## 2024-10-18 DIAGNOSIS — E669 Obesity, unspecified: Secondary | ICD-10-CM | POA: Diagnosis present

## 2024-10-18 DIAGNOSIS — R1114 Bilious vomiting: Secondary | ICD-10-CM

## 2024-10-18 DIAGNOSIS — E119 Type 2 diabetes mellitus without complications: Secondary | ICD-10-CM | POA: Diagnosis not present

## 2024-10-18 DIAGNOSIS — R6520 Severe sepsis without septic shock: Secondary | ICD-10-CM | POA: Diagnosis present

## 2024-10-18 DIAGNOSIS — Z8614 Personal history of Methicillin resistant Staphylococcus aureus infection: Secondary | ICD-10-CM

## 2024-10-18 DIAGNOSIS — K5792 Diverticulitis of intestine, part unspecified, without perforation or abscess without bleeding: Secondary | ICD-10-CM | POA: Diagnosis present

## 2024-10-18 DIAGNOSIS — W449XXA Unspecified foreign body entering into or through a natural orifice, initial encounter: Secondary | ICD-10-CM | POA: Diagnosis present

## 2024-10-18 DIAGNOSIS — Z8679 Personal history of other diseases of the circulatory system: Secondary | ICD-10-CM | POA: Diagnosis not present

## 2024-10-18 DIAGNOSIS — Z79899 Other long term (current) drug therapy: Secondary | ICD-10-CM

## 2024-10-18 DIAGNOSIS — I495 Sick sinus syndrome: Secondary | ICD-10-CM | POA: Diagnosis present

## 2024-10-18 DIAGNOSIS — J410 Simple chronic bronchitis: Secondary | ICD-10-CM

## 2024-10-18 DIAGNOSIS — E872 Acidosis, unspecified: Secondary | ICD-10-CM | POA: Diagnosis present

## 2024-10-18 DIAGNOSIS — I480 Paroxysmal atrial fibrillation: Secondary | ICD-10-CM | POA: Diagnosis present

## 2024-10-18 DIAGNOSIS — J189 Pneumonia, unspecified organism: Secondary | ICD-10-CM | POA: Diagnosis present

## 2024-10-18 DIAGNOSIS — Z7982 Long term (current) use of aspirin: Secondary | ICD-10-CM | POA: Diagnosis not present

## 2024-10-18 DIAGNOSIS — K5730 Diverticulosis of large intestine without perforation or abscess without bleeding: Secondary | ICD-10-CM | POA: Diagnosis not present

## 2024-10-18 DIAGNOSIS — N179 Acute kidney failure, unspecified: Secondary | ICD-10-CM | POA: Diagnosis not present

## 2024-10-18 DIAGNOSIS — I130 Hypertensive heart and chronic kidney disease with heart failure and stage 1 through stage 4 chronic kidney disease, or unspecified chronic kidney disease: Secondary | ICD-10-CM | POA: Diagnosis present

## 2024-10-18 DIAGNOSIS — Z6837 Body mass index (BMI) 37.0-37.9, adult: Secondary | ICD-10-CM

## 2024-10-18 DIAGNOSIS — N400 Enlarged prostate without lower urinary tract symptoms: Secondary | ICD-10-CM | POA: Diagnosis present

## 2024-10-18 DIAGNOSIS — J438 Other emphysema: Secondary | ICD-10-CM | POA: Diagnosis not present

## 2024-10-18 DIAGNOSIS — R509 Fever, unspecified: Principal | ICD-10-CM

## 2024-10-18 DIAGNOSIS — N1831 Chronic kidney disease, stage 3a: Secondary | ICD-10-CM | POA: Diagnosis present

## 2024-10-18 DIAGNOSIS — J188 Other pneumonia, unspecified organism: Secondary | ICD-10-CM | POA: Diagnosis not present

## 2024-10-18 DIAGNOSIS — Z87898 Personal history of other specified conditions: Secondary | ICD-10-CM

## 2024-10-18 DIAGNOSIS — A419 Sepsis, unspecified organism: Principal | ICD-10-CM | POA: Diagnosis present

## 2024-10-18 DIAGNOSIS — G40909 Epilepsy, unspecified, not intractable, without status epilepticus: Secondary | ICD-10-CM | POA: Diagnosis present

## 2024-10-18 DIAGNOSIS — J440 Chronic obstructive pulmonary disease with acute lower respiratory infection: Secondary | ICD-10-CM | POA: Diagnosis present

## 2024-10-18 DIAGNOSIS — Z9889 Other specified postprocedural states: Secondary | ICD-10-CM | POA: Diagnosis not present

## 2024-10-18 DIAGNOSIS — Z7984 Long term (current) use of oral hypoglycemic drugs: Secondary | ICD-10-CM

## 2024-10-18 DIAGNOSIS — E1122 Type 2 diabetes mellitus with diabetic chronic kidney disease: Secondary | ICD-10-CM | POA: Diagnosis present

## 2024-10-18 DIAGNOSIS — T189XXD Foreign body of alimentary tract, part unspecified, subsequent encounter: Secondary | ICD-10-CM | POA: Diagnosis not present

## 2024-10-18 DIAGNOSIS — K5732 Diverticulitis of large intestine without perforation or abscess without bleeding: Secondary | ICD-10-CM | POA: Diagnosis present

## 2024-10-18 DIAGNOSIS — K633 Ulcer of intestine: Secondary | ICD-10-CM | POA: Diagnosis not present

## 2024-10-18 DIAGNOSIS — K640 First degree hemorrhoids: Secondary | ICD-10-CM | POA: Diagnosis not present

## 2024-10-18 DIAGNOSIS — I5032 Chronic diastolic (congestive) heart failure: Secondary | ICD-10-CM | POA: Diagnosis present

## 2024-10-18 DIAGNOSIS — D539 Nutritional anemia, unspecified: Secondary | ICD-10-CM | POA: Diagnosis present

## 2024-10-18 DIAGNOSIS — T189XXA Foreign body of alimentary tract, part unspecified, initial encounter: Secondary | ICD-10-CM | POA: Diagnosis not present

## 2024-10-18 DIAGNOSIS — I2510 Atherosclerotic heart disease of native coronary artery without angina pectoris: Secondary | ICD-10-CM | POA: Diagnosis present

## 2024-10-18 DIAGNOSIS — Z7951 Long term (current) use of inhaled steroids: Secondary | ICD-10-CM

## 2024-10-18 DIAGNOSIS — Z1152 Encounter for screening for COVID-19: Secondary | ICD-10-CM | POA: Diagnosis not present

## 2024-10-18 DIAGNOSIS — R296 Repeated falls: Secondary | ICD-10-CM | POA: Diagnosis present

## 2024-10-18 DIAGNOSIS — R42 Dizziness and giddiness: Secondary | ICD-10-CM

## 2024-10-18 DIAGNOSIS — T184XXA Foreign body in colon, initial encounter: Secondary | ICD-10-CM | POA: Diagnosis present

## 2024-10-18 DIAGNOSIS — J449 Chronic obstructive pulmonary disease, unspecified: Secondary | ICD-10-CM | POA: Diagnosis present

## 2024-10-18 DIAGNOSIS — Z955 Presence of coronary angioplasty implant and graft: Secondary | ICD-10-CM

## 2024-10-18 DIAGNOSIS — Z8379 Family history of other diseases of the digestive system: Secondary | ICD-10-CM

## 2024-10-18 LAB — CBC WITH DIFFERENTIAL/PLATELET
Abs Immature Granulocytes: 0.07 K/uL (ref 0.00–0.07)
Basophils Absolute: 0.1 K/uL (ref 0.0–0.1)
Basophils Relative: 0 %
Eosinophils Absolute: 0 K/uL (ref 0.0–0.5)
Eosinophils Relative: 0 %
HCT: 42.2 % (ref 39.0–52.0)
Hemoglobin: 12.1 g/dL — ABNORMAL LOW (ref 13.0–17.0)
Immature Granulocytes: 1 %
Lymphocytes Relative: 6 %
Lymphs Abs: 0.9 K/uL (ref 0.7–4.0)
MCH: 21.2 pg — ABNORMAL LOW (ref 26.0–34.0)
MCHC: 28.7 g/dL — ABNORMAL LOW (ref 30.0–36.0)
MCV: 73.9 fL — ABNORMAL LOW (ref 80.0–100.0)
Monocytes Absolute: 1 K/uL (ref 0.1–1.0)
Monocytes Relative: 7 %
Neutro Abs: 13.2 K/uL — ABNORMAL HIGH (ref 1.7–7.7)
Neutrophils Relative %: 86 %
Platelets: 395 K/uL (ref 150–400)
RBC: 5.71 MIL/uL (ref 4.22–5.81)
RDW: 16.5 % — ABNORMAL HIGH (ref 11.5–15.5)
WBC: 15.2 K/uL — ABNORMAL HIGH (ref 4.0–10.5)
nRBC: 0 % (ref 0.0–0.2)

## 2024-10-18 LAB — COMPREHENSIVE METABOLIC PANEL WITH GFR
ALT: 13 U/L (ref 0–44)
AST: 28 U/L (ref 15–41)
Albumin: 3.7 g/dL (ref 3.5–5.0)
Alkaline Phosphatase: 98 U/L (ref 38–126)
Anion gap: 12 (ref 5–15)
BUN: 13 mg/dL (ref 8–23)
CO2: 25 mmol/L (ref 22–32)
Calcium: 9.4 mg/dL (ref 8.9–10.3)
Chloride: 104 mmol/L (ref 98–111)
Creatinine, Ser: 1.4 mg/dL — ABNORMAL HIGH (ref 0.61–1.24)
GFR, Estimated: 52 mL/min — ABNORMAL LOW (ref >60)
Glucose, Bld: 81 mg/dL (ref 70–99)
Potassium: 4 mmol/L (ref 3.5–5.1)
Sodium: 142 mmol/L (ref 135–145)
Total Bilirubin: 0.5 mg/dL (ref 0.0–1.2)
Total Protein: 8.6 g/dL — ABNORMAL HIGH (ref 6.5–8.1)

## 2024-10-18 LAB — RESP PANEL BY RT-PCR (RSV, FLU A&B, COVID)  RVPGX2
Influenza A by PCR: NEGATIVE
Influenza B by PCR: NEGATIVE
Resp Syncytial Virus by PCR: NEGATIVE
SARS Coronavirus 2 by RT PCR: NEGATIVE

## 2024-10-18 LAB — URINALYSIS, ROUTINE W REFLEX MICROSCOPIC
Bilirubin Urine: NEGATIVE
Glucose, UA: >=500 mg/dL — AB
Hgb urine dipstick: NEGATIVE
Ketones, ur: 5 mg/dL — AB
Leukocytes,Ua: NEGATIVE
Nitrite: NEGATIVE
Protein, ur: NEGATIVE mg/dL
Specific Gravity, Urine: 1.03 (ref 1.005–1.030)
pH: 5 (ref 5.0–8.0)

## 2024-10-18 LAB — MAGNESIUM: Magnesium: 2.1 mg/dL (ref 1.7–2.4)

## 2024-10-18 LAB — LIPASE, BLOOD: Lipase: 33 U/L (ref 11–51)

## 2024-10-18 MED ORDER — PANTOPRAZOLE SODIUM 40 MG IV SOLR
40.0000 mg | INTRAVENOUS | Status: DC
  Administered 2024-10-19 – 2024-10-23 (×6): 40 mg via INTRAVENOUS
  Filled 2024-10-18 (×6): qty 10

## 2024-10-18 MED ORDER — PHENOL 1.4 % MT LIQD: 2.0000 | OROMUCOSAL | Status: DC | PRN

## 2024-10-18 MED ORDER — SODIUM CHLORIDE 0.9 % IV SOLN: 250.0000 mL | INTRAVENOUS | Status: DC | PRN

## 2024-10-18 MED ORDER — SODIUM CHLORIDE 0.9% FLUSH: 3.0000 mL | INTRAVENOUS | Status: DC | PRN

## 2024-10-18 MED ORDER — MECLIZINE HCL 25 MG PO TABS
12.5000 mg | ORAL_TABLET | Freq: Three times a day (TID) | ORAL | Status: DC | PRN
  Administered 2024-10-19: 12.5 mg via ORAL
  Filled 2024-10-18: qty 1

## 2024-10-18 MED ORDER — ACETAMINOPHEN 325 MG PO TABS: 650.0000 mg | ORAL_TABLET | Freq: Four times a day (QID) | ORAL | Status: DC | PRN

## 2024-10-18 MED ORDER — ACETAMINOPHEN 500 MG PO TABS
1000.0000 mg | ORAL_TABLET | Freq: Once | ORAL | Status: AC
  Administered 2024-10-18: 1000 mg via ORAL
  Filled 2024-10-18: qty 2

## 2024-10-18 MED ORDER — IPRATROPIUM-ALBUTEROL 0.5-2.5 (3) MG/3ML IN SOLN
3.0000 mL | Freq: Four times a day (QID) | RESPIRATORY_TRACT | Status: DC | PRN
  Administered 2024-10-21: 3 mL via RESPIRATORY_TRACT
  Filled 2024-10-18: qty 3

## 2024-10-18 MED ORDER — ATORVASTATIN CALCIUM 40 MG PO TABS
40.0000 mg | ORAL_TABLET | Freq: Every day | ORAL | Status: DC
  Administered 2024-10-19 – 2024-10-24 (×6): 40 mg via ORAL
  Filled 2024-10-18 (×6): qty 1

## 2024-10-18 MED ORDER — FERROUS SULFATE 325 (65 FE) MG PO TABS
325.0000 mg | ORAL_TABLET | Freq: Every day | ORAL | Status: DC
  Administered 2024-10-19 – 2024-10-24 (×5): 325 mg via ORAL
  Filled 2024-10-18 (×6): qty 1

## 2024-10-18 MED ORDER — ACETAMINOPHEN 650 MG RE SUPP: 650.0000 mg | Freq: Four times a day (QID) | RECTAL | Status: DC | PRN

## 2024-10-18 MED ORDER — LAMOTRIGINE 100 MG PO TABS
100.0000 mg | ORAL_TABLET | Freq: Two times a day (BID) | ORAL | Status: DC
  Administered 2024-10-19 – 2024-10-24 (×12): 100 mg via ORAL
  Filled 2024-10-18 (×12): qty 1

## 2024-10-18 MED ORDER — VANCOMYCIN HCL 2000 MG/400ML IV SOLN
2000.0000 mg | Freq: Once | INTRAVENOUS | Status: AC
  Administered 2024-10-19: 2000 mg via INTRAVENOUS
  Filled 2024-10-18: qty 400

## 2024-10-18 MED ORDER — MENTHOL 3 MG MT LOZG: 1.0000 | LOZENGE | OROMUCOSAL | Status: DC | PRN

## 2024-10-18 MED ORDER — MAGIC MOUTHWASH: 15.0000 mL | Freq: Four times a day (QID) | ORAL | Status: DC | PRN

## 2024-10-18 MED ORDER — TAMSULOSIN HCL 0.4 MG PO CAPS
0.4000 mg | ORAL_CAPSULE | Freq: Every day | ORAL | Status: DC
  Administered 2024-10-19 – 2024-10-24 (×6): 0.4 mg via ORAL
  Filled 2024-10-18 (×6): qty 1

## 2024-10-18 MED ORDER — PIPERACILLIN-TAZOBACTAM 3.375 G IVPB
3.3750 g | Freq: Three times a day (TID) | INTRAVENOUS | Status: AC
  Administered 2024-10-19 – 2024-10-23 (×15): 3.375 g via INTRAVENOUS
  Filled 2024-10-18 (×17): qty 50

## 2024-10-18 MED ORDER — SODIUM CHLORIDE 0.9 % IV SOLN: INTRAVENOUS | Status: DC

## 2024-10-18 MED ORDER — ACETAMINOPHEN 325 MG PO TABS: 325.0000 mg | ORAL_TABLET | Freq: Four times a day (QID) | ORAL | Status: DC | PRN

## 2024-10-18 MED ORDER — FLUTICASONE FUROATE 100 MCG/ACT IN AEPB: 1.0000 | INHALATION_SPRAY | Freq: Every day | RESPIRATORY_TRACT | Status: DC

## 2024-10-18 MED ORDER — SODIUM CHLORIDE 0.9% FLUSH: 3.0000 mL | Freq: Two times a day (BID) | INTRAVENOUS | Status: DC

## 2024-10-18 MED ORDER — NAPHAZOLINE-GLYCERIN 0.012-0.25 % OP SOLN: 1.0000 [drp] | Freq: Four times a day (QID) | OPHTHALMIC | Status: DC | PRN

## 2024-10-18 MED ORDER — SALINE SPRAY 0.65 % NA SOLN: 1.0000 | Freq: Four times a day (QID) | NASAL | Status: DC | PRN

## 2024-10-18 MED ORDER — SODIUM CHLORIDE 0.9 % IV SOLN
INTRAVENOUS | Status: DC
Start: 2024-10-18 — End: 2024-10-18

## 2024-10-18 MED ORDER — ALUM & MAG HYDROXIDE-SIMETH 200-200-20 MG/5ML PO SUSP: 30.0000 mL | Freq: Four times a day (QID) | ORAL | Status: DC | PRN

## 2024-10-18 MED ORDER — VANCOMYCIN HCL IN DEXTROSE 1-5 GM/200ML-% IV SOLN
1000.0000 mg | INTRAVENOUS | Status: DC
  Administered 2024-10-19 – 2024-10-21 (×2): 1000 mg via INTRAVENOUS
  Filled 2024-10-18 (×2): qty 200

## 2024-10-18 MED ORDER — METHOCARBAMOL 1000 MG/10ML IJ SOLN: 1000.0000 mg | Freq: Four times a day (QID) | INTRAMUSCULAR | Status: DC | PRN

## 2024-10-18 MED ORDER — LACTATED RINGERS IV SOLN
INTRAVENOUS | Status: DC
Start: 2024-10-18 — End: 2024-10-19

## 2024-10-18 MED ORDER — HYDROMORPHONE HCL 1 MG/ML IJ SOLN: 0.5000 mg | INTRAMUSCULAR | Status: DC | PRN

## 2024-10-18 MED ORDER — LACTATED RINGERS IV BOLUS
1000.0000 mL | Freq: Once | INTRAVENOUS | Status: AC
  Administered 2024-10-18: 1000 mL via INTRAVENOUS

## 2024-10-18 MED ORDER — ONDANSETRON HCL 4 MG/2ML IJ SOLN: 4.0000 mg | Freq: Four times a day (QID) | INTRAMUSCULAR | Status: DC | PRN

## 2024-10-18 MED ORDER — PROCHLORPERAZINE EDISYLATE 10 MG/2ML IJ SOLN: 5.0000 mg | INTRAMUSCULAR | Status: DC | PRN

## 2024-10-18 MED ORDER — PIPERACILLIN-TAZOBACTAM 3.375 G IVPB 30 MIN
3.3750 g | Freq: Once | INTRAVENOUS | Status: DC
  Filled 2024-10-18: qty 50

## 2024-10-18 MED ORDER — PANTOPRAZOLE SODIUM 40 MG PO TBEC: 40.0000 mg | DELAYED_RELEASE_TABLET | Freq: Every day | ORAL | Status: DC

## 2024-10-18 MED ORDER — LACTATED RINGERS IV BOLUS: 1000.0000 mL | Freq: Three times a day (TID) | INTRAVENOUS | Status: AC | PRN

## 2024-10-18 MED ORDER — PIPERACILLIN-TAZOBACTAM 3.375 G IVPB 30 MIN
3.3750 g | Freq: Once | INTRAVENOUS | Status: AC
  Administered 2024-10-18: 3.375 g via INTRAVENOUS

## 2024-10-18 MED ORDER — LEVALBUTEROL HCL 0.63 MG/3ML IN NEBU: 0.6300 mg | INHALATION_SOLUTION | Freq: Four times a day (QID) | RESPIRATORY_TRACT | Status: DC | PRN

## 2024-10-18 MED ORDER — SODIUM CHLORIDE 0.9 % IV SOLN: 1.0000 g | Freq: Once | INTRAVENOUS | Status: DC

## 2024-10-18 MED ORDER — METOPROLOL SUCCINATE ER 25 MG PO TB24
25.0000 mg | ORAL_TABLET | Freq: Every day | ORAL | Status: DC
  Administered 2024-10-19 – 2024-10-24 (×5): 25 mg via ORAL
  Filled 2024-10-18 (×6): qty 1

## 2024-10-18 MED ORDER — SODIUM CHLORIDE 0.9 % IV SOLN: 500.0000 mg | Freq: Once | INTRAVENOUS | Status: DC

## 2024-10-18 MED ORDER — SODIUM CHLORIDE 0.9% FLUSH
3.0000 mL | Freq: Two times a day (BID) | INTRAVENOUS | Status: DC
  Administered 2024-10-18 – 2024-10-23 (×9): 3 mL via INTRAVENOUS

## 2024-10-18 MED ORDER — AMIODARONE HCL 200 MG PO TABS: 200.0000 mg | ORAL_TABLET | Freq: Two times a day (BID) | ORAL | Status: DC

## 2024-10-18 MED ORDER — OXYCODONE HCL 5 MG PO TABS: 5.0000 mg | ORAL_TABLET | ORAL | Status: DC | PRN

## 2024-10-18 MED ORDER — SIMETHICONE 40 MG/0.6ML PO SUSP: 80.0000 mg | Freq: Four times a day (QID) | ORAL | Status: DC | PRN

## 2024-10-18 MED ORDER — DIGOXIN 125 MCG PO TABS
0.1250 mg | ORAL_TABLET | Freq: Every day | ORAL | Status: DC
  Administered 2024-10-19 – 2024-10-24 (×4): 0.125 mg via ORAL
  Filled 2024-10-18 (×6): qty 1

## 2024-10-18 NOTE — ED Notes (Signed)
 Pt to CT

## 2024-10-18 NOTE — Progress Notes (Signed)
 Plan of Care Note (Preliminary telephone/chart evaluation)      [name] [name]  08/06/1951 [id number]  CARE TEAM:  PCP: [name], MD  Outpatient Care Team: Patient Care Team: [name], MD as PCP - General (Family Medicine) Lelon Glendia ONEIDA DEVONNA as Physician Assistant (Cardiology) Legrand Victory LITTIE MOULD, MD as Consulting Physician (Gastroenterology) Onita Duos, MD as Consulting Physician (Neurology)  Inpatient Treatment Team: Treatment Team:  Sundil, Subrina, MD Bernardo Toribio BRAVO, NT Bobbette Hercules, MD [facility], Md, MD Jann Ewings, RN    Surgical advice at the request of Dr. Garrick Darryle Law emergency department.   Chief complaint / Reason for evaluation: Abnormal CAT scan  73 year old male.  Numerous medical issues.  Prior atrial fibrillation and sick sinus syndrome.  On digoxin  and amiodarone  with Lasix . COPD.  Diastolic heart failure, GERD history of repeated passing out/syncopal episodes with neurology consult not proving any seizure activity by EEG.  Patient had another episode of passing out.  Woke up vomiting at the dinner table.  Feeling weak.  Brought to the emergency meant.  X-rays concerning for possible aspiration pneumonia.  Denies any nausea vomiting now.  Denies really any abdominal pain but CAT scan abdomen pelvis was done for thoroughness.  Linear white mass in sigmoid colon noted suspicious for swallowed foreign body me.  Possible disruption of colon but no major perforation or abscess.  Patient denies any abdominal pain.  Patient denies swallowing any foreign bodies or eating any chicken bones.  Of note, patient required esophagoscopy and removal of impacted chicken bone in his esophagus in 2002 by Dr. Mable with ENT.  Assessment   Problem List:  Principal Problem:   Acute diverticulitis Active Problems:   Paroxysmal atrial fibrillation (HCC)   Chronic vertigo   Sick sinus syndrome (HCC)   Multifocal pneumonia   COPD (chronic  obstructive pulmonary disease) (HCC)   Foreign body alimentary tract-questionable   Chronic diastolic CHF (congestive heart failure) (HCC)   History of CAD (coronary artery disease)   History of seizure   Non-insulin dependent type 2 diabetes mellitus (HCC)   Recurrent syncopal episodes of uncertain etiology.  Possible swallowed chicken bone or foreign body in sigmoid colon.  Patient denies this but did have an episode of swallowing a chicken bone in 2002 that required endoscopic removal.  Do not know how reliable of a narrator this person is.  Some mild inflammation in the region but no evidence of any frank perforation or abscess at this time.  No septic shock.  Reasonable to do n.p.o. and bowel rest.  IV antibiotics -would favor Zosyn  for now per standard treatment.  Usually do not do vancomycin  as well but distant history of MRSA.  Can recheck that just in case.  Serial exams.  Most likely will consider repeating CAT scan to see if the foreign body has passed.  If not, consider flex sig.  If that does not work or pt declines with shock/peritonitis; then, operative resection and Hartmann resection if he truly has perforated his colon.  There is no need for emergency surgery tonight but surgery will help follow.  Concern for multifocal pneumonia on antibiotics.  History of COPD and CHF.  Would be helpful to have medicine admit and assess operative risks.  Make sure he is stable enough from a cardiopulmonary standpoint should that be needed.  Surgery will help follow.  Completed formal surgical consultation note pending     Elspeth KYM Schultze, MD, FACS, MASCRS Esophageal, Gastrointestinal & Colorectal  Surgery Robotic and Minimally Invasive Surgery  [facility] [address] [place], [place] [address] [phone] Fax [phone] Main  CONTACT INFORMATION: Weekday (9AM-5PM): Call [facility] main office at  [phone] Weeknight (5PM-9AM) or Weekend/Holiday: Check EPIC Web Links tab & use AMION (password  [email or web address]) for General Surgery [facility] coverage  Please, DO NOT use SecureChat  (it is not reliable communication to reach operating surgeons & will lead to a delay in care).   Epic staff messaging available for outpatient concerns needing 1-2 business day response.      10/18/2024     ########################################################################################################    Past Medical History:  Diagnosis Date   Anemia    Asthma    Atrial fibrillation (HCC)    CHF (congestive heart failure) (HCC)    COPD (chronic obstructive pulmonary disease) (HCC)    Heart disease    Hypertension    Syncope     Past Surgical History:  Procedure Laterality Date   CARDIAC CATHETERIZATION N/A 12/08/2015   Procedure: Left Heart Cath and Coronary Angiography;  Surgeon: Rober Chroman, MD;  Location: [facility];  Service: Cardiovascular;  Laterality: N/A;   ESOPHAGOSCOPY  2002   Rigid esophagoscopy and removal of esophageal foreign body.   FOREIGN BODY REMOVAL ESOPHAGEAL  2002   removal of swallowed chicken bone   SHOULDER CLOSED REDUCTION Left 12/01/2017   Procedure: CLOSED REDUCTION SHOULDER;  Surgeon: Celena Sharper, MD;  Location: [facility] OR;  Service: Orthopedics;  Laterality: Left;    Social History   Socioeconomic History   Marital status: Single    Spouse name: Not on file   Number of children: 1   Years of education: college   Highest education level: Associate degree: academic program  Occupational History   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    Employer: [employer]  Tobacco Use   Smoking status: Never   Smokeless tobacco: Never  Vaping Use   Vaping status: Never Used  Substance and Sexual Activity   Alcohol use: No    Alcohol/week: 0.0 standard drinks of alcohol   Drug use: No   Sexual activity: Not Currently  Other Topics Concern   Not on file   Social History Narrative   Patient lives at home alone and he is single.   Patient is semi retired.    Education college.   Right handed.   Caffeine two cokes daily.   Social Drivers of Corporate Investment Banker Strain: Low Risk  (07/10/2024)   Overall Financial Resource Strain (CARDIA)    Difficulty of Paying Living Expenses: Not very hard  Recent Concern: Financial Resource Strain - Medium Risk (07/06/2024)   Overall Financial Resource Strain (CARDIA)    Difficulty of Paying Living Expenses: Somewhat hard  Food Insecurity: No Food Insecurity (07/06/2024)   Hunger Vital Sign    Worried About Running Out of Food in the Last Year: Never true    Ran Out of Food in the Last Year: Never true  Transportation Needs: No Transportation Needs (07/10/2024)   PRAPARE - Administrator, Civil Service (Medical): No    Lack of Transportation (Non-Medical): No  Physical Activity: Insufficiently Active (07/06/2024)   Exercise Vital Sign    Days of Exercise per Week: 2 days    Minutes of Exercise per Session: 30 min  Stress: No Stress Concern Present (07/10/2024)   [employer] of Occupational Health - Occupational Stress Questionnaire  Feeling of Stress: Not at all  Social Connections: Unknown (07/10/2024)   Social Connection and Isolation Panel    Frequency of Communication with Friends and Family: Twice a week    Frequency of Social Gatherings with Friends and Family: Once a week    Attends Religious Services: Not on Marketing Executive or Organizations: Not on file    Attends Banker Meetings: Not on file    Marital Status: Not on file  Intimate Partner Violence: Not At Risk (03/26/2023)   Humiliation, Afraid, Rape, and Kick questionnaire    Fear of Current or Ex-Partner: No    Emotionally Abused: No    Physically Abused: No    Sexually Abused: No    Family History  Problem Relation Age of Onset   Cancer Mother     Current  Facility-Administered Medications  Medication Dose Route Frequency Provider Last Rate Last Admin   0.9 %  sodium chloride  infusion   Intravenous Continuous Sundil, Subrina, MD       acetaminophen  (TYLENOL ) suppository 650 mg  650 mg Rectal Q6H PRN Sundil, Subrina, MD       acetaminophen  (TYLENOL ) tablet 650 mg  650 mg Oral Q6H PRN Sundil, Subrina, MD       Or   acetaminophen  (TYLENOL ) suppository 650 mg  650 mg Rectal Q6H PRN Sundil, Subrina, MD       acetaminophen  (TYLENOL ) tablet 325-650 mg  325-650 mg Oral Q6H PRN Sundil, Subrina, MD       alum & mag hydroxide-simeth (MAALOX/MYLANTA) 200-200-20 MG/5ML suspension 30 mL  30 mL Oral Q6H PRN Sundil, Subrina, MD       amiodarone  (PACERONE ) tablet 200 mg  200 mg Oral BID Sundil, Subrina, MD       [START ON 10/19/2024] atorvastatin  (LIPITOR) tablet 40 mg  40 mg Oral Daily Sundil, Subrina, MD       [START ON 10/19/2024] digoxin  (LANOXIN ) tablet 0.125 mg  0.125 mg Oral Daily Sundil, Subrina, MD       [START ON 10/19/2024] ferrous sulfate  tablet 325 mg  325 mg Oral Q breakfast Sundil, Subrina, MD       NOREEN ON 10/19/2024] Fluticasone  Furoate AEPB 1 puff  1 puff Inhalation Daily Sundil, Subrina, MD       HYDROmorphone  (DILAUDID ) injection 0.5-2 mg  0.5-2 mg Intravenous Q2H PRN Sundil, Subrina, MD       lactated ringers  bolus 1,000 mL  1,000 mL Intravenous Q8H PRN Sundil, Subrina, MD       lactated ringers  bolus 1,000 mL  1,000 mL Intravenous Once Sundil, Subrina, MD       lactated ringers  infusion   Intravenous Continuous Sundil, Subrina, MD       lamoTRIgine  (LAMICTAL ) tablet 100 mg  100 mg Oral BID Sundil, Subrina, MD       levalbuterol  (XOPENEX ) nebulizer solution 0.63 mg  0.63 mg Nebulization Q6H PRN Sundil, Subrina, MD       magic mouthwash  15 mL Oral QID PRN Sundil, Subrina, MD       menthol (CEPACOL) lozenge 3 mg  1 lozenge Oral PRN Sundil, Subrina, MD       methocarbamol  (ROBAXIN ) injection 1,000 mg  1,000 mg Intravenous Q6H PRN Sundil,  Subrina, MD       [START ON 10/19/2024] metoprolol  succinate (TOPROL -XL) 24 hr tablet 25 mg  25 mg Oral Daily Sundil, Subrina, MD       naphazoline-glycerin (CLEAR EYES REDNESS) ophth  solution 1-2 drop  1-2 drop Both Eyes QID PRN Sundil, Subrina, MD       ondansetron  (ZOFRAN ) injection 4 mg  4 mg Intravenous Q6H PRN Sundil, Subrina, MD       oxyCODONE  (Oxy IR/ROXICODONE ) immediate release tablet 5-10 mg  5-10 mg Oral Q4H PRN Sundil, Subrina, MD       [START ON 10/19/2024] pantoprazole  (PROTONIX ) EC tablet 40 mg  40 mg Oral Daily Sundil, Subrina, MD       pantoprazole  (PROTONIX ) injection 40 mg  40 mg Intravenous Q24H Sundil, Subrina, MD       phenol (CHLORASEPTIC) mouth spray 2 spray  2 spray Mouth/Throat PRN Sundil, Subrina, MD       piperacillin -tazobactam (ZOSYN ) IVPB 3.375 g  3.375 g Intravenous Once Sundil, Subrina, MD       [START ON 10/19/2024] piperacillin -tazobactam (ZOSYN ) IVPB 3.375 g  3.375 g Intravenous Q8H Sundil, Subrina, MD       prochlorperazine (COMPAZINE) injection 5-10 mg  5-10 mg Intravenous Q4H PRN Sundil, Subrina, MD       simethicone (MYLICON) 40 MG/0.6ML suspension 80 mg  80 mg Oral QID PRN Sundil, Subrina, MD       sodium chloride  (OCEAN) 0.65 % nasal spray 1-2 spray  1-2 spray Each Nare Q6H PRN Sundil, Subrina, MD       sodium chloride  flush (NS) 0.9 % injection 3 mL  3 mL Intravenous Q12H Sundil, Subrina, MD       [START ON 10/19/2024] tamsulosin  (FLOMAX ) capsule 0.4 mg  0.4 mg Oral Daily Sundil, Subrina, MD       [START ON 10/19/2024] vancomycin  (VANCOCIN ) IVPB 1000 mg/200 mL premix  1,000 mg Intravenous Q24H Poindexter, Leann T, RPH       vancomycin  (VANCOREADY) IVPB 2000 mg/400 mL  2,000 mg Intravenous Once Sundil, Subrina, MD       Current Outpatient Medications  Medication Sig Dispense Refill   acetaminophen  (TYLENOL ) 325 MG tablet Take 2 tablets (650 mg total) by mouth every 6 (six) hours as needed for mild pain (or Fever >/= 101). 30 tablet 0   albuterol   (PROVENTIL  HFA;VENTOLIN  HFA) 108 (90 Base) MCG/ACT inhaler Inhale 2 puffs into the lungs every 6 (six) hours as needed. 1 Inhaler 5   albuterol  (PROVENTIL ) (2.5 MG/3ML) 0.083% nebulizer solution Take 3 mLs (2.5 mg total) by nebulization every 6 (six) hours as needed for wheezing or shortness of breath. 150 mL 1   allopurinol  (ZYLOPRIM ) 300 MG tablet TAKE 1 TABLET BY MOUTH EVERY DAY 90 tablet 1   amiodarone  (PACERONE ) 200 MG tablet Take 1 tablet (200 mg total) by mouth 2 (two) times daily. 60 tablet 2   ARNUITY ELLIPTA  100 MCG/ACT AEPB Inhale 1 puff into the lungs daily. 30 each 3   aspirin  EC 81 MG tablet Take 81 mg by mouth daily.     atorvastatin  (LIPITOR) 40 MG tablet Take 1 tablet (40 mg total) by mouth daily. 90 tablet 1   clotrimazole -betamethasone  (LOTRISONE ) cream Apply 1 application topically 2 (two) times daily as needed (FOR RASH).     diclofenac  Sodium (VOLTAREN ) 1 % GEL Apply 4 g topically 4 (four) times daily. 100 g 1   digoxin  (LANOXIN ) 0.125 MG tablet Take 1 tablet (0.125 mg total) by mouth daily. 30 tablet 3   empagliflozin  (JARDIANCE ) 10 MG TABS tablet Take 1 tablet (10 mg total) by mouth daily before breakfast. 90 tablet 1   ferrous sulfate  325 (65 FE) MG tablet Take 325  mg by mouth daily with breakfast. Reported on 03/16/2016     fluticasone  (FLONASE ) 50 MCG/ACT nasal spray Place 2 sprays into both nostrils daily. 16 g 6   furosemide  (LASIX ) 20 MG tablet TAKE 1 TABLET BY MOUTH EVERY DAY 90 tablet 1   lamoTRIgine  (LAMICTAL ) 100 MG tablet TAKE 1 TABLET BY MOUTH TWICE A DAY 180 tablet 3   levocetirizine (XYZAL ) 5 MG tablet Take 1 tablet (5 mg total) by mouth every evening. 90 tablet 3   losartan (COZAAR) 100 MG tablet Take 100 mg by mouth daily.  3   metoprolol  succinate (TOPROL -XL) 25 MG 24 hr tablet TAKE 1 TABLET (25 MG TOTAL) BY MOUTH DAILY. 90 tablet 1   nitroGLYCERIN  (NITROSTAT ) 0.4 MG SL tablet Place 1 tablet (0.4 mg total) under the tongue every 5 (five) minutes as needed for  chest pain. 25 tablet 1   pantoprazole  (PROTONIX ) 40 MG tablet Take 1 tablet (40 mg total) by mouth daily. 90 tablet 1   potassium chloride  (K-DUR) 10 MEQ tablet Take 10 mEq by mouth daily.     tacrolimus (PROTOPIC) 0.1 % ointment      tamsulosin  (FLOMAX ) 0.4 MG CAPS capsule Take 1 capsule (0.4 mg total) by mouth daily. 90 capsule 1     No Known Allergies   BP 139/69   Pulse 89   Temp (!) 102 F (38.9 C) (Oral)   Resp (!) 27   Ht 5' 7 (1.702 m)   Wt 106.6 kg   SpO2 100%   BMI 36.81 kg/m     Results:   Labs: Results for orders placed or performed during the hospital encounter of 10/18/24 (from the past 48 hours)  Comprehensive metabolic panel     Status: Abnormal   Collection Time: 10/18/24  8:06 PM  Result Value Ref Range   Sodium 142 135 - 145 mmol/L   Potassium 4.0 3.5 - 5.1 mmol/L   Chloride 104 98 - 111 mmol/L   CO2 25 22 - 32 mmol/L   Glucose, Bld 81 70 - 99 mg/dL    Comment: Glucose reference range applies only to samples taken after fasting for at least 8 hours.   BUN 13 8 - 23 mg/dL   Creatinine, Ser 8.59 (H) 0.61 - 1.24 mg/dL   Calcium  9.4 8.9 - 10.3 mg/dL   Total Protein 8.6 (H) 6.5 - 8.1 g/dL   Albumin  3.7 3.5 - 5.0 g/dL   AST 28 15 - 41 U/L   ALT 13 0 - 44 U/L   Alkaline Phosphatase 98 38 - 126 U/L   Total Bilirubin 0.5 0.0 - 1.2 mg/dL   GFR, Estimated 52 (L) >60 mL/min    Comment: (NOTE) Calculated using the CKD-EPI Creatinine Equation (2021)    Anion gap 12 5 - 15    Comment: Performed at [facility], [address] [place], [place] [address]  CBC WITH DIFFERENTIAL     Status: Abnormal   Collection Time: 10/18/24  8:06 PM  Result Value Ref Range   WBC 15.2 (H) 4.0 - 10.5 K/uL   RBC 5.71 4.22 - 5.81 MIL/uL   Hemoglobin 12.1 (L) 13.0 - 17.0 g/dL   HCT 57.7 60.9 - 47.9 %   MCV 73.9 (L) 80.0 - 100.0 fL   MCH 21.2 (L) 26.0 - 34.0 pg   MCHC 28.7 (L) 30.0 - 36.0 g/dL   RDW 83.4 (H) 88.4 - 84.4 %   Platelets 395 150 - 400 K/uL  nRBC 0.0 0.0 - 0.2 %   Neutrophils Relative % 86 %   Neutro Abs 13.2 (H) 1.7 - 7.7 K/uL   Lymphocytes Relative 6 %   Lymphs Abs 0.9 0.7 - 4.0 K/uL   Monocytes Relative 7 %   Monocytes Absolute 1.0 0.1 - 1.0 K/uL   Eosinophils Relative 0 %   Eosinophils Absolute 0.0 0.0 - 0.5 K/uL   Basophils Relative 0 %   Basophils Absolute 0.1 0.0 - 0.1 K/uL   Immature Granulocytes 1 %   Abs Immature Granulocytes 0.07 0.00 - 0.07 K/uL    Comment: Performed at [facility], [address] [place], [place] [address]  Lipase, blood     Status: None   Collection Time: 10/18/24  8:06 PM  Result Value Ref Range   Lipase 33 11 - 51 U/L    Comment: Performed at [facility], [address], [place], [place] [address]  Magnesium     Status: None   Collection Time: 10/18/24  8:06 PM  Result Value Ref Range   Magnesium 2.1 1.7 - 2.4 mg/dL    Comment: Performed at [facility], [address], [place], [place] [address]  Urinalysis, Routine w reflex microscopic -Urine, Clean Catch     Status: Abnormal   Collection Time: 10/18/24  9:52 PM  Result Value Ref Range   Color, Urine YELLOW YELLOW   APPearance HAZY (A) CLEAR   Specific Gravity, Urine 1.030 1.005 - 1.030   pH 5.0 5.0 - 8.0   Glucose, UA >=500 (A) NEGATIVE mg/dL   Hgb urine dipstick NEGATIVE NEGATIVE   Bilirubin Urine NEGATIVE NEGATIVE   Ketones, ur 5 (A) NEGATIVE mg/dL   Protein, ur NEGATIVE NEGATIVE mg/dL   Nitrite NEGATIVE NEGATIVE   Leukocytes,Ua NEGATIVE NEGATIVE    Comment: Performed at [facility], [address] [place], [place] [address]    Imaging / Studies: CT ABDOMEN PELVIS WO CONTRAST Result Date: 10/18/2024 EXAM: CT ABDOMEN AND PELVIS WITHOUT CONTRAST 10/18/2024 10:43:25 PM TECHNIQUE: CT of the abdomen and pelvis was performed without the administration of intravenous contrast. Multiplanar reformatted images are provided for review. Automated  exposure control, iterative reconstruction, and/or weight-based adjustment of the mA/kV was utilized to reduce the radiation dose to as low as reasonably achievable. COMPARISON: None available. CLINICAL HISTORY: Diverticulitis, complication suspected. FINDINGS: LOWER CHEST: Patchy bibasilar airspace infiltrates are present in keeping with multifocal infection in the acute setting. LIVER: The liver is unremarkable. GALLBLADDER AND BILE DUCTS: Gallbladder is unremarkable. No biliary ductal dilatation. A small hiatal hernia is present. SPLEEN: No acute abnormality. PANCREAS: No acute abnormality. ADRENAL GLANDS: No acute abnormality. KIDNEYS, URETERS AND BLADDER: No stones in the kidneys or ureters. No hydronephrosis. No perinephric or periureteral stranding. Urinary bladder is unremarkable. GI AND BOWEL: A wire-like metallic foreign body measuring roughly 6 cm in length has perforated the wall of the sigmoid colon at both ends with moderate surrounding inflammatory change identified. This is best seen on axial images 64/2 through 69/2. There is moderate background descending and sigmoid colonic diverticulosis. No free intraperitoneal gas or fluid. No pericolonic fluid collection identified. No evidence of obstruction. The stomach, small bowel, and large bowel are otherwise unremarkable. The appendix is normal. PERITONEUM AND RETROPERITONEUM: No ascites. No free air. VASCULATURE: Mild aortoiliac atherosclerotic calcification. No aortic aneurysm. LYMPH NODES: No lymphadenopathy. REPRODUCTIVE ORGANS: No acute abnormality. BONES AND SOFT TISSUES: Moderate right and severe left hip degenerative arthritis. Degenerative changes  are seen within the visualized thoracolumbar spine. No acute bone abnormality. No lytic or blastic bone lesion. IMPRESSION: 1. Wire-like metallic intraluminal foreign body (~6 cm) perforating the sigmoid colon at both ends with moderate surrounding inflammatory change. No free intraperitoneal gas or  fluid, no pericolonic fluid collection, and no bowel obstruction. Surgical consultation recommended. 2. Moderate descending and sigmoid diverticulosis. 3. Patchy bibasilar airspace infiltrates consistent with acute multifocal infection. Electronically signed by: Dorethia Molt MD 10/18/2024 10:55 PM EDT RP Workstation: [id number]   CT HEAD WO CONTRAST Result Date: 10/18/2024 EXAM: CT HEAD WITHOUT CONTRAST 10/18/2024 06:56:18 PM TECHNIQUE: CT of the head was performed without the administration of intravenous contrast. Automated exposure control, iterative reconstruction, and/or weight based adjustment of the mA/kV was utilized to reduce the radiation dose to as low as reasonably achievable. COMPARISON: 04/17/2017 CLINICAL HISTORY: Mental status change, unknown cause. Pt BIB ems after having a syncope episode and vomiting after waking up on his dinning table. Pt states feeling weak and not well today. VS stable. Hx of afib. FINDINGS: BRAIN AND VENTRICLES: No acute hemorrhage. No evidence of acute infarct. Subcortical and periventricular small vessel ischemic changes, particularly in the subcortical right frontal lobe. No hydrocephalus. No extra-axial collection. No mass effect or midline shift. ORBITS: No acute abnormality. SINUSES: No acute abnormality. SOFT TISSUES AND SKULL: No acute soft tissue abnormality. No skull fracture. IMPRESSION: 1. No acute intracranial abnormality. Electronically signed by: Pinkie Pebbles MD 10/18/2024 07:02 PM EDT RP Workstation: [id number]   DG Chest Port 1 View Result Date: 10/18/2024 EXAM: 1 VIEW XRAY OF THE CHEST 01/20/2019 COMPARISON: None available. CLINICAL HISTORY: LH?SABRA Syncope episode and vomiting after waking up on his dinning table. Hx of afib FINDINGS: LUNGS AND PLEURA: No focal pulmonary opacity. No pulmonary edema. No pleural effusion. No pneumothorax. HEART AND MEDIASTINUM: No acute abnormality of the cardiac and mediastinal silhouettes. BONES AND SOFT TISSUES:  No acute osseous abnormality. IMPRESSION: 1. No acute process. Electronically signed by: Greig Pique MD 10/18/2024 06:41 PM EDT RP Workstation: [id number]    Medications / Allergies: per chart  Antibiotics: Anti-infectives (From admission, onward)    Start     Dose/Rate Route Frequency Ordered Stop   10/19/24 2330  vancomycin  (VANCOCIN ) IVPB 1000 mg/200 mL premix        1,000 mg 200 mL/hr over 60 Minutes Intravenous Every 24 hours 10/18/24 2321     10/19/24 0600  piperacillin -tazobactam (ZOSYN ) IVPB 3.375 g        3.375 g 12.5 mL/hr over 240 Minutes Intravenous Every 8 hours 10/18/24 2319 10/24/24 0559   10/18/24 2330  azithromycin (ZITHROMAX) 500 mg in sodium chloride  0.9 % 250 mL IVPB  Status:  Discontinued        500 mg 250 mL/hr over 60 Minutes Intravenous  Once 10/18/24 2317 10/18/24 2324   10/18/24 2330  vancomycin  (VANCOREADY) IVPB 2000 mg/400 mL        2,000 mg 200 mL/hr over 120 Minutes Intravenous  Once 10/18/24 2319     10/18/24 2315  cefTRIAXone  (ROCEPHIN ) 1 g in sodium chloride  0.9 % 100 mL IVPB  Status:  Discontinued        1 g 200 mL/hr over 30 Minutes Intravenous  Once 10/18/24 2300 10/18/24 2303   10/18/24 2315  azithromycin (ZITHROMAX) 500 mg in sodium chloride  0.9 % 250 mL IVPB  Status:  Discontinued        500 mg 250 mL/hr over 60 Minutes Intravenous  Once 10/18/24 2300 10/18/24 2303  10/18/24 2315  piperacillin -tazobactam (ZOSYN ) IVPB 3.375 g        3.375 g 100 mL/hr over 30 Minutes Intravenous  Once 10/18/24 2306           Note: Portions of this report may have been transcribed using voice recognition software. Every effort was made to ensure accuracy; however, inadvertent computerized transcription errors may be present.   Any transcriptional errors that result from this process are unintentional.      10/18/2024  11:28 PM

## 2024-10-18 NOTE — ED Notes (Signed)
 X-ray at bedside.

## 2024-10-18 NOTE — ED Provider Notes (Signed)
 [facility] AT [facility] Provider Note   CSN: [id number] Arrival date & time: 10/18/24  1721     Patient presents with: Emesis and Loss of Consciousness   KYRON SCHLITT is a 73 y.o. male.   HPI Patient presents initially alone, subsequently joined by his sister. Patient has a long history of tinnitus, which is currently active, but otherwise was well until today.  He had an episode today of nausea, vomiting, abdominal discomfort, possible syncope.  He notes that after the episode he was slightly disoriented, but currently is oriented appropriately, denies substantial pain, denies nausea, does describe weakness.  History notable for A-fib, sick sinus syndrome, patient currently on amiodarone , dig, Lasix     Prior to Admission medications   Medication Sig Start Date End Date Taking? Authorizing Provider  acetaminophen  (TYLENOL ) 325 MG tablet Take 2 tablets (650 mg total) by mouth every 6 (six) hours as needed for mild pain (or Fever >/= 101). 12/03/17   Rai, Nydia POUR, MD  albuterol  (PROVENTIL  HFA;VENTOLIN  HFA) 108 (90 Base) MCG/ACT inhaler Inhale 2 puffs into the lungs every 6 (six) hours as needed. 11/19/17   Newlin, Enobong, MD  albuterol  (PROVENTIL ) (2.5 MG/3ML) 0.083% nebulizer solution Take 3 mLs (2.5 mg total) by nebulization every 6 (six) hours as needed for wheezing or shortness of breath. 08/17/17   [name], MD  allopurinol  (ZYLOPRIM ) 300 MG tablet TAKE 1 TABLET BY MOUTH EVERY DAY 09/28/24   Newlin, Enobong, MD  amiodarone  (PACERONE ) 200 MG tablet Take 1 tablet (200 mg total) by mouth 2 (two) times daily. 12/03/17   Rai, Ripudeep POUR, MD  ARNUITY ELLIPTA  100 MCG/ACT AEPB Inhale 1 puff into the lungs daily. 11/20/18   Geronimo Amel, MD  aspirin  EC 81 MG tablet Take 81 mg by mouth daily.    [provider]  atorvastatin  (LIPITOR) 40 MG tablet Take 1 tablet (40 mg total) by mouth daily. 07/10/24   Newlin, Enobong, MD   clotrimazole -betamethasone  (LOTRISONE ) cream Apply 1 application topically 2 (two) times daily as needed (FOR RASH). 12/03/17   Rai, Nydia POUR, MD  diclofenac  Sodium (VOLTAREN ) 1 % GEL Apply 4 g topically 4 (four) times daily. 07/11/21   Newlin, Enobong, MD  digoxin  (LANOXIN ) 0.125 MG tablet Take 1 tablet (0.125 mg total) by mouth daily. 12/03/17   Rai, Nydia POUR, MD  empagliflozin  (JARDIANCE ) 10 MG TABS tablet Take 1 tablet (10 mg total) by mouth daily before breakfast. 07/10/24   Newlin, Enobong, MD  ferrous sulfate  325 (65 FE) MG tablet Take 325 mg by mouth daily with breakfast. Reported on 03/16/2016    [provider]  fluticasone  (FLONASE ) 50 MCG/ACT nasal spray Place 2 sprays into both nostrils daily. 07/10/24   Newlin, Enobong, MD  furosemide  (LASIX ) 20 MG tablet TAKE 1 TABLET BY MOUTH EVERY DAY 02/21/24   Newlin, Enobong, MD  lamoTRIgine  (LAMICTAL ) 100 MG tablet TAKE 1 TABLET BY MOUTH TWICE A DAY 01/04/24   Yan, Yijun, MD  levocetirizine (XYZAL ) 5 MG tablet Take 1 tablet (5 mg total) by mouth every evening. 03/13/24 03/08/25  Sebastian Beverley NOVAK, MD  losartan (COZAAR) 100 MG tablet Take 100 mg by mouth daily. 06/03/18   [provider]  metoprolol  succinate (TOPROL -XL) 25 MG 24 hr tablet TAKE 1 TABLET (25 MG TOTAL) BY MOUTH DAILY. 07/17/24   Newlin, Enobong, MD  nitroGLYCERIN  (NITROSTAT ) 0.4 MG SL tablet Place 1 tablet (0.4 mg total) under the tongue every 5 (five) minutes as  needed for chest pain. 01/25/14   Claudene Pacific, MD  pantoprazole  (PROTONIX ) 40 MG tablet Take 1 tablet (40 mg total) by mouth daily. 07/10/24   Newlin, Enobong, MD  potassium chloride  (K-DUR) 10 MEQ tablet Take 10 mEq by mouth daily.    [provider]  tacrolimus (PROTOPIC) 0.1 % ointment  07/19/21   [provider]  tamsulosin  (FLOMAX ) 0.4 MG CAPS capsule Take 1 capsule (0.4 mg total) by mouth daily. 07/10/24   Newlin, Enobong, MD    Allergies: Patient has no known allergies.    Review of  Systems  Updated Vital Signs BP 139/69   Pulse 89   Temp (!) 102 F (38.9 C) (Oral)   Resp (!) 27   Ht 1.702 m (5' 7)   Wt 106.6 kg   SpO2 100%   BMI 36.81 kg/m   Physical Exam Vitals and nursing note reviewed.  Constitutional:      General: He is not in acute distress.    Appearance: He is well-developed.  HENT:     Head: Normocephalic and atraumatic.  Eyes:     Conjunctiva/sclera: Conjunctivae normal.  Cardiovascular:     Rate and Rhythm: Normal rate and regular rhythm.  Pulmonary:     Effort: Pulmonary effort is normal. No respiratory distress.     Breath sounds: No stridor.  Abdominal:     General: There is no distension.     Comments: No appreciable discomfort with palpation  Skin:    General: Skin is warm and dry.  Neurological:     Mental Status: He is alert and oriented to person, place, and time.     (all labs ordered are listed, but only abnormal results are displayed) Labs Reviewed  COMPREHENSIVE METABOLIC PANEL WITH GFR - Abnormal; Notable for the following components:      Result Value   Creatinine, Ser 1.40 (*)    Total Protein 8.6 (*)    GFR, Estimated 52 (*)    All other components within normal limits  CBC WITH DIFFERENTIAL/PLATELET - Abnormal; Notable for the following components:   WBC 15.2 (*)    Hemoglobin 12.1 (*)    MCV 73.9 (*)    MCH 21.2 (*)    MCHC 28.7 (*)    RDW 16.5 (*)    Neutro Abs 13.2 (*)    All other components within normal limits  URINALYSIS, ROUTINE W REFLEX MICROSCOPIC - Abnormal; Notable for the following components:   APPearance HAZY (*)    Glucose, UA >=500 (*)    Ketones, ur 5 (*)    All other components within normal limits  RESP PANEL BY RT-PCR (RSV, FLU A&B, COVID)  RVPGX2  CULTURE, BLOOD (SINGLE)  LIPASE, BLOOD  MAGNESIUM  CBG MONITORING, ED    EKG: EKG Interpretation Date/Time:  Saturday October 18 2024 19:01:58 EDT Ventricular Rate:  71 PR Interval:    QRS Duration:  100 QT Interval:  394 QTC  Calculation: 429 R Axis:   42  Text Interpretation: Sinus rhythm Artifact Abnormal ECG Confirmed by Garrick Charleston [phone]) on 10/18/2024 9:43:09 PM  Radiology: CT ABDOMEN PELVIS WO CONTRAST Result Date: 10/18/2024 EXAM: CT ABDOMEN AND PELVIS WITHOUT CONTRAST 10/18/2024 10:43:25 PM TECHNIQUE: CT of the abdomen and pelvis was performed without the administration of intravenous contrast. Multiplanar reformatted images are provided for review. Automated exposure control, iterative reconstruction, and/or weight-based adjustment of the mA/kV was utilized to reduce the radiation dose to as low as reasonably achievable. COMPARISON: None available.  CLINICAL HISTORY: Diverticulitis, complication suspected. FINDINGS: LOWER CHEST: Patchy bibasilar airspace infiltrates are present in keeping with multifocal infection in the acute setting. LIVER: The liver is unremarkable. GALLBLADDER AND BILE DUCTS: Gallbladder is unremarkable. No biliary ductal dilatation. A small hiatal hernia is present. SPLEEN: No acute abnormality. PANCREAS: No acute abnormality. ADRENAL GLANDS: No acute abnormality. KIDNEYS, URETERS AND BLADDER: No stones in the kidneys or ureters. No hydronephrosis. No perinephric or periureteral stranding. Urinary bladder is unremarkable. GI AND BOWEL: A wire-like metallic foreign body measuring roughly 6 cm in length has perforated the wall of the sigmoid colon at both ends with moderate surrounding inflammatory change identified. This is best seen on axial images 64/2 through 69/2. There is moderate background descending and sigmoid colonic diverticulosis. No free intraperitoneal gas or fluid. No pericolonic fluid collection identified. No evidence of obstruction. The stomach, small bowel, and large bowel are otherwise unremarkable. The appendix is normal. PERITONEUM AND RETROPERITONEUM: No ascites. No free air. VASCULATURE: Mild aortoiliac atherosclerotic calcification. No aortic aneurysm. LYMPH NODES: No  lymphadenopathy. REPRODUCTIVE ORGANS: No acute abnormality. BONES AND SOFT TISSUES: Moderate right and severe left hip degenerative arthritis. Degenerative changes are seen within the visualized thoracolumbar spine. No acute bone abnormality. No lytic or blastic bone lesion. IMPRESSION: 1. Wire-like metallic intraluminal foreign body (~6 cm) perforating the sigmoid colon at both ends with moderate surrounding inflammatory change. No free intraperitoneal gas or fluid, no pericolonic fluid collection, and no bowel obstruction. Surgical consultation recommended. 2. Moderate descending and sigmoid diverticulosis. 3. Patchy bibasilar airspace infiltrates consistent with acute multifocal infection. Electronically signed by: Dorethia Molt MD 10/18/2024 10:55 PM EDT RP Workstation: [id number]   CT HEAD WO CONTRAST Result Date: 10/18/2024 EXAM: CT HEAD WITHOUT CONTRAST 10/18/2024 06:56:18 PM TECHNIQUE: CT of the head was performed without the administration of intravenous contrast. Automated exposure control, iterative reconstruction, and/or weight based adjustment of the mA/kV was utilized to reduce the radiation dose to as low as reasonably achievable. COMPARISON: 04/17/2017 CLINICAL HISTORY: Mental status change, unknown cause. Pt BIB ems after having a syncope episode and vomiting after waking up on his dinning table. Pt states feeling weak and not well today. VS stable. Hx of afib. FINDINGS: BRAIN AND VENTRICLES: No acute hemorrhage. No evidence of acute infarct. Subcortical and periventricular small vessel ischemic changes, particularly in the subcortical right frontal lobe. No hydrocephalus. No extra-axial collection. No mass effect or midline shift. ORBITS: No acute abnormality. SINUSES: No acute abnormality. SOFT TISSUES AND SKULL: No acute soft tissue abnormality. No skull fracture. IMPRESSION: 1. No acute intracranial abnormality. Electronically signed by: Pinkie Pebbles MD 10/18/2024 07:02 PM EDT RP  Workstation: [id number]   DG Chest Port 1 View Result Date: 10/18/2024 EXAM: 1 VIEW XRAY OF THE CHEST 01/20/2019 COMPARISON: None available. CLINICAL HISTORY: LH?SABRA Syncope episode and vomiting after waking up on his dinning table. Hx of afib FINDINGS: LUNGS AND PLEURA: No focal pulmonary opacity. No pulmonary edema. No pleural effusion. No pneumothorax. HEART AND MEDIASTINUM: No acute abnormality of the cardiac and mediastinal silhouettes. BONES AND SOFT TISSUES: No acute osseous abnormality. IMPRESSION: 1. No acute process. Electronically signed by: Greig Pique MD 10/18/2024 06:41 PM EDT RP Workstation: [id number]     Procedures   Medications Ordered in the ED  0.9 %  sodium chloride  infusion (0 mLs Intravenous Hold 10/18/24 2027)  lactated ringers  infusion (has no administration in time range)  piperacillin -tazobactam (ZOSYN ) IVPB 3.375 g (has no administration in time range)  azithromycin (ZITHROMAX) 500 mg in sodium  chloride 0.9 % 250 mL IVPB (has no administration in time range)  lactated ringers  bolus 1,000 mL (has no administration in time range)  lactated ringers  bolus 1,000 mL (has no administration in time range)  piperacillin -tazobactam (ZOSYN ) IVPB 3.375 g (has no administration in time range)  HYDROmorphone  (DILAUDID ) injection 0.5-2 mg (has no administration in time range)  methocarbamol  (ROBAXIN ) injection 1,000 mg (has no administration in time range)  acetaminophen  (TYLENOL ) tablet 325-650 mg (has no administration in time range)  acetaminophen  (TYLENOL ) suppository 650 mg (has no administration in time range)  oxyCODONE  (Oxy IR/ROXICODONE ) immediate release tablet 5-10 mg (has no administration in time range)  ondansetron  (ZOFRAN ) injection 4 mg (has no administration in time range)  prochlorperazine (COMPAZINE) injection 5-10 mg (has no administration in time range)  phenol (CHLORASEPTIC) mouth spray 2 spray (has no administration in time range)  menthol (CEPACOL)  lozenge 3 mg (has no administration in time range)  magic mouthwash (has no administration in time range)  alum & mag hydroxide-simeth (MAALOX/MYLANTA) 200-200-20 MG/5ML suspension 30 mL (has no administration in time range)  simethicone (MYLICON) 40 MG/0.6ML suspension 80 mg (has no administration in time range)  naphazoline-glycerin (CLEAR EYES REDNESS) ophth solution 1-2 drop (has no administration in time range)  sodium chloride  (OCEAN) 0.65 % nasal spray 1-2 spray (has no administration in time range)  vancomycin  (VANCOREADY) IVPB 2000 mg/400 mL (has no administration in time range)  acetaminophen  (TYLENOL ) tablet 1,000 mg (1,000 mg Oral Given 10/18/24 2142)                                    Medical Decision Making Adult male with multiple medical problems including sick sinus syndrome, A-fib, COPD, hypertension presents after episode of nausea, vomiting, questionable syncope. Patient is awake, alert, providing his own history.  Patient afebrile on arrival, but develops fever soon thereafter. With fever, nausea, vomiting, concern for GI versus other etiology, patient has no chest pain, though with his sick sinus syndrome, syncope, concern for arrhythmia. Cardiac 90 sinus normal pulse ox 100% room air normal  Amount and/or Complexity of Data Reviewed Independent Historian:     Details: Sibling at bedside External Data Reviewed: notes. Labs: ordered. Decision-making details documented in ED Course. Radiology: ordered and independent interpretation performed. Decision-making details documented in ED Course. ECG/medicine tests: ordered and independent interpretation performed. Decision-making details documented in ED Course.  Risk OTC drugs. Prescription drug management.   Update: Patient accompanied by his sister at bedside.  We discussed his history, including long history of tinnitus. Patient now found to have fever, new since arrival, Tylenol  provided, urinalysis, CT, COVID  test pending.  11:01 PM Patient with CT evidence for pneumonia as well as possible perforation of the sigmoid colon with small metallic object..  On repeat exam patient continues to have no abdominal pain, no guarding or tenderness in this area.  On chart review patient has had chicken bone removal via endoscopy in the distant past.  Though he does not recall any specific chicken eating, or swallowing any bones, this is a possibility. I discussed patient's case with her general surgeon, Dr. Sheldon, we reviewed the patient's images.  Imaging seemingly not consistent with perforation, no pneumoperitoneum, no peritonitis, no substantial findings such as abscess. Patient appropriate for antibiotics as though diverticulitis with surgical team following. I subsequently discussed patient's case with our hospitalist colleagues for admission given concern for pneumonia, possible  retained foreign body with inflammatory changes in the sigmoid colon.  Patient admitted for further monitoring, management.    Final diagnoses:  Fever in adult  Bilious vomiting with nausea  Community acquired pneumonia, unspecified laterality  Foreign body in colon, initial encounter  Sepsis without acute organ dysfunction, due to unspecified organism [facility])   CRITICAL CARE Performed by: Lamar Salen Total critical care time: 35 minutes Critical care time was exclusive of separately billable procedures and treating other patients. Critical care was necessary to treat or prevent imminent or life-threatening deterioration. Critical care was time spent personally by me on the following activities: development of treatment plan with patient and/or surrogate as well as nursing, discussions with consultants, evaluation of patient's response to treatment, examination of patient, obtaining history from patient or surrogate, ordering and performing treatments and interventions, ordering and review of laboratory studies, ordering and  review of radiographic studies, pulse oximetry and re-evaluation of patient's condition.    Salen Lamar, MD 10/18/24 [phone]

## 2024-10-18 NOTE — Sepsis Progress Note (Signed)
 Elink following code sepsis

## 2024-10-18 NOTE — H&P (Incomplete)
 History and Physical    MAC DOWDELL FMW:[id number] DOB: June 21, 1951 DOA: 10/18/2024  PCP: [name], MD   Patient coming from: Home   Chief Complaint:  Chief Complaint  Patient presents with   Emesis   Loss of Consciousness   ED TRIAGE note:  Pt BIB ems after having a syncope episode and vomiting after waking up on his dinning table. Pt states feeling weak and not well today. VS stable. Hx of afib            HPI:  [name] [name] is a 73 y.o. male with medical history significant of paroxysmal atrial fibrillation, atrial flutter, chronic diastolic heart failure, CAD with cardiac stent placement, sick sinus syndrome, COPD, GERD, seizure, BPH, hypogonadism, gout, chronic tinnitus and vertigo, and non-insulin-dependent DM type II presented to emergency department complaining of episode of nausea, vomiting abdominal discomfort and presyncope episode.  Patient reported that the episode make him slightly disoriented currently he is oriented.  Denies any chest pain, palpitation, and shortness of breath. Patient denies any abdominal pain, tenderness on palpation.  Denies any nausea and vomiting.  Patient reported that he ate rice and beef in the afternoon however unable to recall swallowing any foreign body by mistake.  He ate his food with a spoon. Patient does not have any other complaint at this time.  ED Course:  At presentation to ED patient found febrile temperature 102 F, tachypneic 27 otherwise hemodynamically stable. Lab, CBC showing leukocytosis 15.2, stable H&H and normal platelet count. CMP showing creatinine 1.4 which is around baseline otherwise unremarkable CMP.  Normal lipase level. UA positive ketone no evidence of UTI. Pending respiratory panel. Pending blood cultures.  CT abdomen pelvis showing: 1. Wire-like metallic intraluminal foreign body (~6 cm) perforating the sigmoid colon at both ends with moderate surrounding inflammatory change. No free  intraperitoneal gas or fluid, no pericolonic fluid collection, and no bowel obstruction. Surgical consultation recommended. 2. Moderate descending and sigmoid diverticulosis. 3. Patchy bibasilar airspace infiltrates consistent with acute multifocal infection.  CT head no acute intracranial monitor.  Chest x-ray unremarkable.  In the ED patient received Zosyn  and started on maintenance fluid.  EDP discussed with general surgery Dr. Sheldon, per general surgery there is no suspicion of foreign body with associated perforation given patient does not have any heavy peritoneum or perforation even though it has been read by radiologist.  General surgery recommended treat for diverticulitis and general surgery will consult.  Hospitalist consulted for further evaluation management of sepsis in the setting of possible foreign body in the sigmoid colon, diverticulitis and pneumonia.  Significant labs in the ED: Lab Orders         Resp panel by RT-PCR (RSV, Flu A&B, Covid) Anterior Nasal Swab         Culture, blood (Routine X 2) w Reflex to ID Panel         Expectorated Sputum Assessment w Gram Stain, Rflx to Resp Cult         Respiratory (~20 pathogens) panel by PCR         MRSA Next Gen by PCR, Nasal         Comprehensive metabolic panel         CBC WITH DIFFERENTIAL         Lipase, blood         Magnesium         Urinalysis, Routine w reflex microscopic -Urine, Clean Catch  Urinalysis, Routine w reflex microscopic -Urine, Clean Catch         Lactic acid, plasma         CBC         Prealbumin         Creatinine, serum         Strep pneumoniae urinary antigen         Legionella Pneumophila Serogp 1 Ur Ag         Procalcitonin         Comprehensive metabolic panel         CBG monitoring, ED       Review of Systems:  Review of Systems  Constitutional:  Positive for fever. Negative for chills, malaise/fatigue and weight loss.  Respiratory:  Negative for cough, sputum production  and shortness of breath.   Cardiovascular:  Negative for chest pain, palpitations and leg swelling.  Gastrointestinal:  Negative for abdominal pain, blood in stool, heartburn, nausea and vomiting.  Neurological:  Positive for dizziness. Negative for headaches.  Psychiatric/Behavioral:  The patient is not nervous/anxious.     Past Medical History:  Diagnosis Date   Anemia    Asthma    Atrial fibrillation (HCC)    CHF (congestive heart failure) (HCC)    COPD (chronic obstructive pulmonary disease) (HCC)    Heart disease    Hypertension    Syncope     Past Surgical History:  Procedure Laterality Date   CARDIAC CATHETERIZATION N/A 12/08/2015   Procedure: Left Heart Cath and Coronary Angiography;  Surgeon: Rober Chroman, MD;  Location: [facility];  Service: Cardiovascular;  Laterality: N/A;   ESOPHAGOSCOPY  2002   Rigid esophagoscopy and removal of esophageal foreign body.   FOREIGN BODY REMOVAL ESOPHAGEAL  2002   removal of swallowed chicken bone   SHOULDER CLOSED REDUCTION Left 12/01/2017   Procedure: CLOSED REDUCTION SHOULDER;  Surgeon: Celena Sharper, MD;  Location: [facility] OR;  Service: Orthopedics;  Laterality: Left;     reports that he has never smoked. He has never used smokeless tobacco. He reports that he does not drink alcohol and does not use drugs.  No Known Allergies  Family History  Problem Relation Age of Onset   Cancer Mother     Prior to Admission medications   Medication Sig Start Date End Date Taking? Authorizing Provider  acetaminophen  (TYLENOL ) 325 MG tablet Take 2 tablets (650 mg total) by mouth every 6 (six) hours as needed for mild pain (or Fever >/= 101). 12/03/17   Rai, Nydia POUR, MD  albuterol  (PROVENTIL  HFA;VENTOLIN  HFA) 108 (90 Base) MCG/ACT inhaler Inhale 2 puffs into the lungs every 6 (six) hours as needed. 11/19/17   Newlin, Enobong, MD  albuterol  (PROVENTIL ) (2.5 MG/3ML) 0.083% nebulizer solution Take 3 mLs (2.5 mg total) by nebulization  every 6 (six) hours as needed for wheezing or shortness of breath. 08/17/17   [name], MD  allopurinol  (ZYLOPRIM ) 300 MG tablet TAKE 1 TABLET BY MOUTH EVERY DAY 09/28/24   Newlin, Enobong, MD  amiodarone  (PACERONE ) 200 MG tablet Take 1 tablet (200 mg total) by mouth 2 (two) times daily. 12/03/17   Rai, Nydia POUR, MD  ARNUITY ELLIPTA  100 MCG/ACT AEPB Inhale 1 puff into the lungs daily. 11/20/18   Geronimo Amel, MD  aspirin  EC 81 MG tablet Take 81 mg by mouth daily.    [provider]  atorvastatin  (LIPITOR) 40 MG tablet Take 1 tablet (40 mg total) by mouth daily. 07/10/24  Newlin, Enobong, MD  clotrimazole -betamethasone  (LOTRISONE ) cream Apply 1 application topically 2 (two) times daily as needed (FOR RASH). 12/03/17   Rai, Nydia POUR, MD  diclofenac  Sodium (VOLTAREN ) 1 % GEL Apply 4 g topically 4 (four) times daily. 07/11/21   Newlin, Enobong, MD  digoxin  (LANOXIN ) 0.125 MG tablet Take 1 tablet (0.125 mg total) by mouth daily. 12/03/17   Rai, Nydia POUR, MD  empagliflozin  (JARDIANCE ) 10 MG TABS tablet Take 1 tablet (10 mg total) by mouth daily before breakfast. 07/10/24   Newlin, Enobong, MD  ferrous sulfate  325 (65 FE) MG tablet Take 325 mg by mouth daily with breakfast. Reported on 03/16/2016    [provider]  fluticasone  (FLONASE ) 50 MCG/ACT nasal spray Place 2 sprays into both nostrils daily. 07/10/24   Newlin, Enobong, MD  furosemide  (LASIX ) 20 MG tablet TAKE 1 TABLET BY MOUTH EVERY DAY 02/21/24   Newlin, Enobong, MD  lamoTRIgine  (LAMICTAL ) 100 MG tablet TAKE 1 TABLET BY MOUTH TWICE A DAY 01/04/24   Yan, Yijun, MD  levocetirizine (XYZAL ) 5 MG tablet Take 1 tablet (5 mg total) by mouth every evening. 03/13/24 03/08/25  Sebastian Beverley NOVAK, MD  losartan (COZAAR) 100 MG tablet Take 100 mg by mouth daily. 06/03/18   [provider]  metoprolol  succinate (TOPROL -XL) 25 MG 24 hr tablet TAKE 1 TABLET (25 MG TOTAL) BY MOUTH DAILY. 07/17/24   Newlin, Enobong, MD   nitroGLYCERIN  (NITROSTAT ) 0.4 MG SL tablet Place 1 tablet (0.4 mg total) under the tongue every 5 (five) minutes as needed for chest pain. 01/25/14   Claudene Pacific, MD  pantoprazole  (PROTONIX ) 40 MG tablet Take 1 tablet (40 mg total) by mouth daily. 07/10/24   Newlin, Enobong, MD  potassium chloride  (K-DUR) 10 MEQ tablet Take 10 mEq by mouth daily.    [provider]  tacrolimus (PROTOPIC) 0.1 % ointment  07/19/21   [provider]  tamsulosin  (FLOMAX ) 0.4 MG CAPS capsule Take 1 capsule (0.4 mg total) by mouth daily. 07/10/24   [name], MD     Physical Exam: Vitals:   10/18/24 2119 10/18/24 2120 10/18/24 2351 10/19/24 0055  BP: 139/69  (!) 117/59 (!) 149/71  Pulse: 89  93 78  Resp: (!) 27  20 20   Temp:  (!) 102 F (38.9 C) 98.6 F (37 C) 98.8 F (37.1 C)  TempSrc:  Oral Oral Oral  SpO2: 100%  96% 97%  Weight:      Height:        Physical Exam Vitals reviewed.  Constitutional:      Appearance: Normal appearance.  HENT:     Mouth/Throat:     Mouth: Mucous membranes are moist.  Eyes:     Pupils: Pupils are equal, round, and reactive to light.  Cardiovascular:     Rate and Rhythm: Normal rate and regular rhythm.     Pulses: Normal pulses.     Heart sounds: Normal heart sounds.  Pulmonary:     Effort: Pulmonary effort is normal.     Breath sounds: Normal breath sounds.  Abdominal:     General: Bowel sounds are normal. There is no distension.     Palpations: Abdomen is soft.     Tenderness: There is no abdominal tenderness. There is no guarding or rebound.  Musculoskeletal:     Cervical back: Neck supple.  Skin:    Capillary Refill: Capillary refill takes less than 2 seconds.  Neurological:     Mental Status: He is alert and  oriented to person, place, and time.  Psychiatric:        Mood and Affect: Mood normal.      Labs on Admission: I have personally reviewed following labs and imaging studies  CBC: Recent Labs  Lab 10/18/24 2006  WBC  15.2*  NEUTROABS 13.2*  HGB 12.1*  HCT 42.2  MCV 73.9*  PLT 395   Basic Metabolic Panel: Recent Labs  Lab 10/18/24 2006  NA 142  K 4.0  CL 104  CO2 25  GLUCOSE 81  BUN 13  CREATININE 1.40*  CALCIUM  9.4  MG 2.1   GFR: Estimated Creatinine Clearance: 53.1 mL/min (A) (by C-G formula based on SCr of 1.4 mg/dL (H)). Liver Function Tests: Recent Labs  Lab 10/18/24 2006  AST 28  ALT 13  ALKPHOS 98  BILITOT 0.5  PROT 8.6*  ALBUMIN  3.7   Recent Labs  Lab 10/18/24 2006  LIPASE 33   No results for input(s): AMMONIA in the last 168 hours. Coagulation Profile: No results for input(s): INR, PROTIME in the last 168 hours. Cardiac Enzymes: No results for input(s): CKTOTAL, CKMB, CKMBINDEX, TROPONINI, TROPONINIHS in the last 168 hours. BNP (last 3 results) No results for input(s): BNP in the last 8760 hours. HbA1C: No results for input(s): HGBA1C in the last 72 hours. CBG: No results for input(s): GLUCAP in the last 168 hours. Lipid Profile: No results for input(s): CHOL, HDL, LDLCALC, TRIG, CHOLHDL, LDLDIRECT in the last 72 hours. Thyroid  Function Tests: No results for input(s): TSH, T4TOTAL, FREET4, T3FREE, THYROIDAB in the last 72 hours. Anemia Panel: No results for input(s): VITAMINB12, FOLATE, FERRITIN, TIBC, IRON, RETICCTPCT in the last 72 hours. Urine analysis:    Component Value Date/Time   COLORURINE YELLOW 10/18/2024 2152   APPEARANCEUR HAZY (A) 10/18/2024 2152   APPEARANCEUR Clear 06/12/2024 0000   LABSPEC 1.030 10/18/2024 2152   PHURINE 5.0 10/18/2024 2152   GLUCOSEU >=500 (A) 10/18/2024 2152   HGBUR NEGATIVE 10/18/2024 2152   BILIRUBINUR NEGATIVE 10/18/2024 2152   BILIRUBINUR Negative 06/12/2024 0000   KETONESUR 5 (A) 10/18/2024 2152   PROTEINUR NEGATIVE 10/18/2024 2152   UROBILINOGEN 0.2 02/01/2023 0000   UROBILINOGEN 1.0 01/26/2011 0647   NITRITE NEGATIVE 10/18/2024 2152   LEUKOCYTESUR  NEGATIVE 10/18/2024 2152    Radiological Exams on Admission: I have personally reviewed images CT ABDOMEN PELVIS WO CONTRAST Result Date: 10/18/2024 EXAM: CT ABDOMEN AND PELVIS WITHOUT CONTRAST 10/18/2024 10:43:25 PM TECHNIQUE: CT of the abdomen and pelvis was performed without the administration of intravenous contrast. Multiplanar reformatted images are provided for review. Automated exposure control, iterative reconstruction, and/or weight-based adjustment of the mA/kV was utilized to reduce the radiation dose to as low as reasonably achievable. COMPARISON: None available. CLINICAL HISTORY: Diverticulitis, complication suspected. FINDINGS: LOWER CHEST: Patchy bibasilar airspace infiltrates are present in keeping with multifocal infection in the acute setting. LIVER: The liver is unremarkable. GALLBLADDER AND BILE DUCTS: Gallbladder is unremarkable. No biliary ductal dilatation. A small hiatal hernia is present. SPLEEN: No acute abnormality. PANCREAS: No acute abnormality. ADRENAL GLANDS: No acute abnormality. KIDNEYS, URETERS AND BLADDER: No stones in the kidneys or ureters. No hydronephrosis. No perinephric or periureteral stranding. Urinary bladder is unremarkable. GI AND BOWEL: A wire-like metallic foreign body measuring roughly 6 cm in length has perforated the wall of the sigmoid colon at both ends with moderate surrounding inflammatory change identified. This is best seen on axial images 64/2 through 69/2. There is moderate background descending and sigmoid colonic diverticulosis. No free  intraperitoneal gas or fluid. No pericolonic fluid collection identified. No evidence of obstruction. The stomach, small bowel, and large bowel are otherwise unremarkable. The appendix is normal. PERITONEUM AND RETROPERITONEUM: No ascites. No free air. VASCULATURE: Mild aortoiliac atherosclerotic calcification. No aortic aneurysm. LYMPH NODES: No lymphadenopathy. REPRODUCTIVE ORGANS: No acute abnormality. BONES AND  SOFT TISSUES: Moderate right and severe left hip degenerative arthritis. Degenerative changes are seen within the visualized thoracolumbar spine. No acute bone abnormality. No lytic or blastic bone lesion. IMPRESSION: 1. Wire-like metallic intraluminal foreign body (~6 cm) perforating the sigmoid colon at both ends with moderate surrounding inflammatory change. No free intraperitoneal gas or fluid, no pericolonic fluid collection, and no bowel obstruction. Surgical consultation recommended. 2. Moderate descending and sigmoid diverticulosis. 3. Patchy bibasilar airspace infiltrates consistent with acute multifocal infection. Electronically signed by: Dorethia Molt MD 10/18/2024 10:55 PM EDT RP Workstation: [id number]   CT HEAD WO CONTRAST Result Date: 10/18/2024 EXAM: CT HEAD WITHOUT CONTRAST 10/18/2024 06:56:18 PM TECHNIQUE: CT of the head was performed without the administration of intravenous contrast. Automated exposure control, iterative reconstruction, and/or weight based adjustment of the mA/kV was utilized to reduce the radiation dose to as low as reasonably achievable. COMPARISON: 04/17/2017 CLINICAL HISTORY: Mental status change, unknown cause. Pt BIB ems after having a syncope episode and vomiting after waking up on his dinning table. Pt states feeling weak and not well today. VS stable. Hx of afib. FINDINGS: BRAIN AND VENTRICLES: No acute hemorrhage. No evidence of acute infarct. Subcortical and periventricular small vessel ischemic changes, particularly in the subcortical right frontal lobe. No hydrocephalus. No extra-axial collection. No mass effect or midline shift. ORBITS: No acute abnormality. SINUSES: No acute abnormality. SOFT TISSUES AND SKULL: No acute soft tissue abnormality. No skull fracture. IMPRESSION: 1. No acute intracranial abnormality. Electronically signed by: Pinkie Pebbles MD 10/18/2024 07:02 PM EDT RP Workstation: [id number]   DG Chest Port 1 View Result Date:  10/18/2024 EXAM: 1 VIEW XRAY OF THE CHEST 01/20/2019 COMPARISON: None available. CLINICAL HISTORY: LH?SABRA Syncope episode and vomiting after waking up on his dinning table. Hx of afib FINDINGS: LUNGS AND PLEURA: No focal pulmonary opacity. No pulmonary edema. No pleural effusion. No pneumothorax. HEART AND MEDIASTINUM: No acute abnormality of the cardiac and mediastinal silhouettes. BONES AND SOFT TISSUES: No acute osseous abnormality. IMPRESSION: 1. No acute process. Electronically signed by: Greig Pique MD 10/18/2024 06:41 PM EDT RP Workstation: [id number]     EKG: My personal interpretation of EKG shows: Normal sinus rhythm heart rate 71    Assessment/Plan: Principal Problem:   Acute diverticulitis Active Problems:   Multifocal pneumonia   Foreign body alimentary tract-questionable   Paroxysmal atrial fibrillation (HCC)   Chronic vertigo   Sick sinus syndrome (HCC)   COPD (chronic obstructive pulmonary disease) (HCC)   Syncope and collapse   Chronic diastolic CHF (congestive heart failure) (HCC)   History of CAD (coronary artery disease)   History of seizure   Non-insulin dependent type 2 diabetes mellitus (HCC)    Assessment and Plan: Sepsis in the setting of acute diverticulitis and multifocal pneumonia Acute diverticulitis Questionable foreign body in the sigmoid colon without perforation -Presented emergency department with sudden onset of abdominal pain associated nausea vomiting and presyncopal episode.  At presentation to ED patient found febrile and tachypneic otherwise hemodynamically stable. - CBC showing leukocytosis.  CMP unremarkable.  UA unremarkable Pending lactic acid level.  And bile blood cultures. - CT abdomen pelvis showing Wire-like metallic intraluminal foreign body (~6 cm) perforating  the sigmoid colon at both ends with moderate surrounding inflammatory change. No free intraperitoneal gas or fluid, no pericolonic fluid collection, and no bowel obstruction.  Surgical consultation recommended.  Moderate descending and sigmoid diverticulosis. -Physical exam no acute abdominal sign-guarding, rebound or rigidity.  Soft abdomen and nontender on palpation. -EDP discussed with general surgery Dr. Sheldon, per general surgery there is no suspicion of foreign body with associated perforation given patient does not have any heavy peritoneum or perforation even though it has been read by radiologist.  General surgery recommended treat for diverticulitis and general surgery will be available if needed. -Continue serial abdominal exam. -Continue to treat with IV Zosyn . -Starting IV Protonix  40 mg daily - Keeping patient n.p.o. and continue serial abdominal exam for development of any acute abdominal sign - In the ED patient received 2 L of LR bolus per sepsis protocol currently on maintenance fluid LR 125 cc/h. -General Surgery will consult and follow along. Addendum - General Surgery evaluated patient concern for possible chicken bone foreign body in the sigmoid colon.  Recommended to keep n.p.o. and bowel rest.  IV antibiotics Zosyn .  Serial exam. Most likely will consider repeating CAT scan to see if the foreign body has passed. If not or patient worse, may require operative resection and Hartmann resection if he truly has perforated his colon. There is no need for emergency surgery tonight but surgery will help follow. -Ordered repeat CT scan for the afternoon around 1 PM. -Appreciate general surgery input.  Multifocal pneumonia -CT abdomen pelvis showing bilateral airspace infiltrate consistent with acute multiple focal infection. -Patient meets sepsis criteria in the setting of leukocytosis, tachypnea and febrile.  However blood pressure within good range - CT abdomen pelvis showing multifocal pneumonia - Continue IV vancomycin  and Zosyn  in the setting of treating for pneumonia as well as questionable foreign body in the elementary tract. - Obtaining sputum  culture, urine Legionella antigen, urine strep antigen, respiratory panel and procalcitonin.   History of atrial flutter and paroxysmal atrial fibrillation -EKG showed normal sinus rhythm heart rate 71. - Per chart review at home patient is on aspirin  81 mg daily and not on full anticoagulation due to history of recurrent fall from chronic tinnitus and vertigo. - Per chart review and verified with pharmacy patient currently is not on amiodarone .  At the baseline he is poor historian unable to verify if he is taking amiodarone  or not.   -Given EKG showing normal sinus rhythm without any atrial fibrillation continue Toprol -XL 25 mg daily.   Holding off on reinitiating amiodarone  at this point given patient has not been refilled daily for last 6 months.  History of sick sinus syndrome  Chronic diastolic heart failure -Continue digoxin  and Toprol -XL. - Holding losartan and Lasix  in the setting of sepsis.  History of COPD Continue DuoNeb as needed    History of CAD -Holding aspirin  as concern for possible foreign body in the elementary track.  Continue Toprol -XL  History of GERD -Currently on IV Protonix   History of seizure -Continue Lamictal  100 mg twice daily  Non-insulin-dependent DM type II Jardiance  as patient is NPO.  CKD 3A - Renal function at baseline.  Continue to monitor  Chronic tinnitus with vertigo and history of recurrent fall - Ambulate with assistance. -Starting meclizine  3 times daily as needed.  DVT prophylaxis:  SCDs and TED hose.  Deferring pharmacological DVT prophylaxis as concern for foreign body in the sigmoid colon. Code Status:  Full Code Diet: N.p.o. Family Communication:  Family was present at bedside, at the time of interview. Opportunity was given to ask question and all questions were answered satisfactorily.  Disposition Plan: Continue to monitor development of any acute abdominal sign. Consults: General Surgery Admission status:   Inpatient,  Telemetry bed  Severity of Illness: The appropriate patient status for this patient is INPATIENT. Inpatient status is judged to be reasonable and necessary in order to provide the required intensity of service to ensure the patient's safety. The patient's presenting symptoms, physical exam findings, and initial radiographic and laboratory data in the context of their chronic comorbidities is felt to place them at high risk for further clinical deterioration. Furthermore, it is not anticipated that the patient will be medically stable for discharge from the hospital within 2 midnights of admission.   * I certify that at the point of admission it is my clinical judgment that the patient will require inpatient hospital care spanning beyond 2 midnights from the point of admission due to high intensity of service, high risk for further deterioration and high frequency of surveillance required.DEWAINE    Christohper Dube, MD [facility]  How to contact the [facility] Attending or Consulting provider 7A - 7P or covering provider during after hours 7P -7A, for this patient.  Check the care team in [facility] and look for a) attending/consulting [facility] provider listed and b) the [facility] team listed Log into [email or web address] and use [facility] universal password to access. If you do not have the password, please contact the hospital operator. Locate the [facility] provider you are looking for under [facility] and page to a number that you can be directly reached. If you still have difficulty reaching the provider, please page the [facility] (Director on Call) for the Hospitalists listed on amion for assistance.  10/19/2024, 1:51 AM

## 2024-10-18 NOTE — Progress Notes (Signed)
 ED Pharmacy Antibiotic Sign Off An antibiotic consult was received from an ED provider for Zosyn  per pharmacy dosing for sepsis. A chart review was completed to assess appropriateness.   The following one time order(s) were placed:  Zosyn  3.375g IV  Further antibiotic and/or antibiotic pharmacy consults should be ordered by the admitting provider if indicated.   Thank you for allowing pharmacy to be a part of this patient's care.   Arvin Gauss, PharmD Clinical Pharmacist 10/18/24 11:06 PM

## 2024-10-18 NOTE — Progress Notes (Signed)
 Pharmacy Antibiotic Note  [name] [name] is a 73 y.o. male admitted on 10/18/2024 with pneumonia, diverticulitis and sepsis.  Pharmacy has been consulted for Vancomycin  dosing.  CT shows pneumonia as well as possible perforation of the sigmoid colon with small metallic object.  Plan: Zosyn  3.375g IV q8h (4 hour infusion). Vancomycin  2g IV x 1 followed by Vancomycin  1000 mg IV Q 24 hrs. Goal AUC 400-550. Expected AUC: 471.7  SCr used: 1.4 Daily SCr while on both vancomycin  and zosyn   Height: 5' 7 (170.2 cm) Weight: 106.6 kg (235 lb) IBW/kg (Calculated) : 66.1  Temp (24hrs), Avg:100.1 F (37.8 C), Min:98.1 F (36.7 C), Max:102 F (38.9 C)  Recent Labs  Lab 10/18/24 2006  WBC 15.2*  CREATININE 1.40*    Estimated Creatinine Clearance: 53.1 mL/min (A) (by C-G formula based on SCr of 1.4 mg/dL (H)).    No Known Allergies  Antimicrobials this admission: 10/25 Zosyn  >>   10/25 Vancomycin  >>     Thank you for allowing pharmacy to be a part of this patient's care.  Arvin Gauss, PharmD 10/18/2024 11:23 PM

## 2024-10-18 NOTE — ED Notes (Signed)
 IV team at bedside

## 2024-10-18 NOTE — ED Triage Notes (Signed)
 Pt BIB ems after having a syncope episode and vomiting after waking up on his dinning table. Pt states feeling weak and not well today. VS stable. Hx of afib

## 2024-10-19 ENCOUNTER — Inpatient Hospital Stay (HOSPITAL_COMMUNITY)

## 2024-10-19 DIAGNOSIS — K5792 Diverticulitis of intestine, part unspecified, without perforation or abscess without bleeding: Secondary | ICD-10-CM | POA: Diagnosis not present

## 2024-10-19 DIAGNOSIS — I5032 Chronic diastolic (congestive) heart failure: Secondary | ICD-10-CM | POA: Diagnosis not present

## 2024-10-19 DIAGNOSIS — T189XXD Foreign body of alimentary tract, part unspecified, subsequent encounter: Secondary | ICD-10-CM | POA: Diagnosis not present

## 2024-10-19 DIAGNOSIS — I495 Sick sinus syndrome: Secondary | ICD-10-CM

## 2024-10-19 DIAGNOSIS — J438 Other emphysema: Secondary | ICD-10-CM

## 2024-10-19 LAB — URINALYSIS, ROUTINE W REFLEX MICROSCOPIC
Bilirubin Urine: NEGATIVE
Glucose, UA: NEGATIVE mg/dL
Ketones, ur: NEGATIVE mg/dL
Leukocytes,Ua: NEGATIVE
Nitrite: NEGATIVE
Protein, ur: NEGATIVE mg/dL
RBC / HPF: >50 RBC/hpf (ref 0–5)
Specific Gravity, Urine: 1.015 (ref 1.005–1.030)
pH: 7 (ref 5.0–8.0)

## 2024-10-19 LAB — FOLATE: Folate: 8.4 ng/mL (ref >5.9)

## 2024-10-19 LAB — COMPREHENSIVE METABOLIC PANEL WITH GFR
ALT: 8 U/L (ref 0–44)
AST: 21 U/L (ref 15–41)
Albumin: 2.9 g/dL — ABNORMAL LOW (ref 3.5–5.0)
Alkaline Phosphatase: 75 U/L (ref 38–126)
Anion gap: 10 (ref 5–15)
BUN: 15 mg/dL (ref 8–23)
CO2: 24 mmol/L (ref 22–32)
Calcium: 8.6 mg/dL — ABNORMAL LOW (ref 8.9–10.3)
Chloride: 105 mmol/L (ref 98–111)
Creatinine, Ser: 1.51 mg/dL — ABNORMAL HIGH (ref 0.61–1.24)
GFR, Estimated: 48 mL/min — ABNORMAL LOW (ref >60)
Glucose, Bld: 105 mg/dL — ABNORMAL HIGH (ref 70–99)
Potassium: 4.1 mmol/L (ref 3.5–5.1)
Sodium: 139 mmol/L (ref 135–145)
Total Bilirubin: 0.5 mg/dL (ref 0.0–1.2)
Total Protein: 6.6 g/dL (ref 6.5–8.1)

## 2024-10-19 LAB — LACTIC ACID, PLASMA
Lactic Acid, Venous: 2.2 mmol/L (ref 0.5–1.9)
Lactic Acid, Venous: 2 mmol/L (ref 0.5–1.9)
Lactic Acid, Venous: 2.1 mmol/L (ref 0.5–1.9)
Lactic Acid, Venous: 1.2 mmol/L (ref 0.5–1.9)

## 2024-10-19 LAB — RESPIRATORY PANEL BY PCR

## 2024-10-19 LAB — CBC
HCT: 31.9 % — ABNORMAL LOW (ref 39.0–52.0)
Hemoglobin: 9.7 g/dL — ABNORMAL LOW (ref 13.0–17.0)
MCH: 22.5 pg — ABNORMAL LOW (ref 26.0–34.0)
MCHC: 30.4 g/dL (ref 30.0–36.0)
MCV: 73.8 fL — ABNORMAL LOW (ref 80.0–100.0)
Platelets: 306 K/uL (ref 150–400)
RBC: 4.32 MIL/uL (ref 4.22–5.81)
RDW: 16.2 % — ABNORMAL HIGH (ref 11.5–15.5)
WBC: 24.8 K/uL — ABNORMAL HIGH (ref 4.0–10.5)
nRBC: 0 % (ref 0.0–0.2)

## 2024-10-19 LAB — IRON AND TIBC
Iron: 47 ug/dL (ref 45–182)
Saturation Ratios: 23 % (ref 17.9–39.5)
TIBC: 203 ug/dL — ABNORMAL LOW (ref 250–450)
UIBC: 156 ug/dL

## 2024-10-19 LAB — PREALBUMIN: Prealbumin: 12 mg/dL — ABNORMAL LOW (ref 18–38)

## 2024-10-19 LAB — PROCALCITONIN: Procalcitonin: 0.18 ng/mL

## 2024-10-19 LAB — VITAMIN B12: Vitamin B-12: 330 pg/mL (ref 180–914)

## 2024-10-19 LAB — FERRITIN: Ferritin: 110 ng/mL (ref 24–336)

## 2024-10-19 LAB — STREP PNEUMONIAE URINARY ANTIGEN: Strep Pneumo Urinary Antigen: NEGATIVE

## 2024-10-19 MED ORDER — BUDESONIDE 0.5 MG/2ML IN SUSP
0.5000 mg | Freq: Two times a day (BID) | RESPIRATORY_TRACT | Status: DC
  Administered 2024-10-19 – 2024-10-24 (×10): 0.5 mg via RESPIRATORY_TRACT
  Filled 2024-10-19 (×10): qty 2

## 2024-10-19 MED ORDER — LACTATED RINGERS IV BOLUS
1000.0000 mL | INTRAVENOUS | Status: AC
  Administered 2024-10-19: 1000 mL via INTRAVENOUS

## 2024-10-19 MED ORDER — LACTATED RINGERS IV SOLN: INTRAVENOUS | Status: AC

## 2024-10-19 MED ORDER — BUDESONIDE 0.25 MG/2ML IN SUSP
0.2500 mg | Freq: Two times a day (BID) | RESPIRATORY_TRACT | Status: DC
  Administered 2024-10-19: 0.25 mg via RESPIRATORY_TRACT
  Filled 2024-10-19: qty 2

## 2024-10-19 MED ORDER — LACTATED RINGERS IV BOLUS
500.0000 mL | Freq: Once | INTRAVENOUS | Status: AC
  Administered 2024-10-19: 500 mL via INTRAVENOUS

## 2024-10-19 NOTE — Progress Notes (Signed)
 Subjective/Chief Complaint: No complaints   Objective: Vital signs in last 24 hours: Temp:  [98.1 F (36.7 C)-102 F (38.9 C)] 98.6 F (37 C) (10/26 0439) Pulse Rate:  [64-93] 64 (10/26 0819) Resp:  [18-27] 20 (10/26 0439) BP: (117-153)/(58-77) 122/58 (10/26 0819) SpO2:  [96 %-100 %] 100 % (10/26 0847) Weight:  [106.6 kg] 106.6 kg (10/25 1742) Last BM Date : (P) 10/18/24  Intake/Output from previous day: No intake/output data recorded. Intake/Output this shift: Total I/O In: 100 [P.O.:100] Out: -   General appearance: alert and cooperative Resp: clear to auscultation bilaterally Cardio: regular rate and rhythm GI: soft, nontender  Lab Results:  Recent Labs    10/18/24 2006 10/19/24 0424  WBC 15.2* 24.8*  HGB 12.1* 9.7*  HCT 42.2 31.9*  PLT 395 306   BMET Recent Labs    10/18/24 2006 10/19/24 0424  NA 142 139  K 4.0 4.1  CL 104 105  CO2 25 24  GLUCOSE 81 105*  BUN 13 15  CREATININE 1.40* 1.51*  CALCIUM  9.4 8.6*   PT/INR No results for input(s): LABPROT, INR in the last 72 hours. ABG No results for input(s): PHART, HCO3 in the last 72 hours.  Invalid input(s): PCO2, PO2  Studies/Results: DG Abd Portable 1V Result Date: 10/19/2024 EXAM: 1 VIEW XRAY OF THE ABDOMEN 10/19/2024 04:42:00 AM COMPARISON: CT abdomen and pelvis yesterday. CLINICAL HISTORY: Foreign, possible chicken bone in sigmoid colon. FINDINGS: LINES, TUBES AND DEVICES: EKG leads noted. BOWEL: Nonobstructive bowel gas pattern. The left lower quadrant curvilinear foreign body appearing lesion located in the sigmoid colon is not apparent radiographically. SOFT TISSUES: No opaque urinary calculi. BONES: Degenerative changes of lumbar spine. No acute osseous abnormality. IMPRESSION: 1. CT suspected curvilinear foreign body in the sigmoid colon not radiographically visible. 2. Nonobstructive bowel gas pattern. Electronically signed by: Helayne Hurst MD 10/19/2024 04:53 AM EDT RP  Workstation: [id number]   CT ABDOMEN PELVIS WO CONTRAST Result Date: 10/18/2024 EXAM: CT ABDOMEN AND PELVIS WITHOUT CONTRAST 10/18/2024 10:43:25 PM TECHNIQUE: CT of the abdomen and pelvis was performed without the administration of intravenous contrast. Multiplanar reformatted images are provided for review. Automated exposure control, iterative reconstruction, and/or weight-based adjustment of the mA/kV was utilized to reduce the radiation dose to as low as reasonably achievable. COMPARISON: None available. CLINICAL HISTORY: Diverticulitis, complication suspected. FINDINGS: LOWER CHEST: Patchy bibasilar airspace infiltrates are present in keeping with multifocal infection in the acute setting. LIVER: The liver is unremarkable. GALLBLADDER AND BILE DUCTS: Gallbladder is unremarkable. No biliary ductal dilatation. A small hiatal hernia is present. SPLEEN: No acute abnormality. PANCREAS: No acute abnormality. ADRENAL GLANDS: No acute abnormality. KIDNEYS, URETERS AND BLADDER: No stones in the kidneys or ureters. No hydronephrosis. No perinephric or periureteral stranding. Urinary bladder is unremarkable. GI AND BOWEL: A wire-like metallic foreign body measuring roughly 6 cm in length has perforated the wall of the sigmoid colon at both ends with moderate surrounding inflammatory change identified. This is best seen on axial images 64/2 through 69/2. There is moderate background descending and sigmoid colonic diverticulosis. No free intraperitoneal gas or fluid. No pericolonic fluid collection identified. No evidence of obstruction. The stomach, small bowel, and large bowel are otherwise unremarkable. The appendix is normal. PERITONEUM AND RETROPERITONEUM: No ascites. No free air. VASCULATURE: Mild aortoiliac atherosclerotic calcification. No aortic aneurysm. LYMPH NODES: No lymphadenopathy. REPRODUCTIVE ORGANS: No acute abnormality. BONES AND SOFT TISSUES: Moderate right and severe left hip degenerative arthritis.  Degenerative changes are seen within the visualized  thoracolumbar spine. No acute bone abnormality. No lytic or blastic bone lesion. IMPRESSION: 1. Wire-like metallic intraluminal foreign body (~6 cm) perforating the sigmoid colon at both ends with moderate surrounding inflammatory change. No free intraperitoneal gas or fluid, no pericolonic fluid collection, and no bowel obstruction. Surgical consultation recommended. 2. Moderate descending and sigmoid diverticulosis. 3. Patchy bibasilar airspace infiltrates consistent with acute multifocal infection. Electronically signed by: Dorethia Molt MD 10/18/2024 10:55 PM EDT RP Workstation: [id number]   CT HEAD WO CONTRAST Result Date: 10/18/2024 EXAM: CT HEAD WITHOUT CONTRAST 10/18/2024 06:56:18 PM TECHNIQUE: CT of the head was performed without the administration of intravenous contrast. Automated exposure control, iterative reconstruction, and/or weight based adjustment of the mA/kV was utilized to reduce the radiation dose to as low as reasonably achievable. COMPARISON: 04/17/2017 CLINICAL HISTORY: Mental status change, unknown cause. Pt BIB ems after having a syncope episode and vomiting after waking up on his dinning table. Pt states feeling weak and not well today. VS stable. Hx of afib. FINDINGS: BRAIN AND VENTRICLES: No acute hemorrhage. No evidence of acute infarct. Subcortical and periventricular small vessel ischemic changes, particularly in the subcortical right frontal lobe. No hydrocephalus. No extra-axial collection. No mass effect or midline shift. ORBITS: No acute abnormality. SINUSES: No acute abnormality. SOFT TISSUES AND SKULL: No acute soft tissue abnormality. No skull fracture. IMPRESSION: 1. No acute intracranial abnormality. Electronically signed by: Pinkie Pebbles MD 10/18/2024 07:02 PM EDT RP Workstation: [id number]   DG Chest Port 1 View Result Date: 10/18/2024 EXAM: 1 VIEW XRAY OF THE CHEST 01/20/2019 COMPARISON: None available.  CLINICAL HISTORY: LH?SABRA Syncope episode and vomiting after waking up on his dinning table. Hx of afib FINDINGS: LUNGS AND PLEURA: No focal pulmonary opacity. No pulmonary edema. No pleural effusion. No pneumothorax. HEART AND MEDIASTINUM: No acute abnormality of the cardiac and mediastinal silhouettes. BONES AND SOFT TISSUES: No acute osseous abnormality. IMPRESSION: 1. No acute process. Electronically signed by: Greig Pique MD 10/18/2024 06:41 PM EDT RP Workstation: [id number]    Anti-infectives: Anti-infectives (From admission, onward)    Start     Dose/Rate Route Frequency Ordered Stop   10/19/24 2330  vancomycin  (VANCOCIN ) IVPB 1000 mg/200 mL premix        1,000 mg 200 mL/hr over 60 Minutes Intravenous Every 24 hours 10/18/24 2321     10/19/24 0600  piperacillin -tazobactam (ZOSYN ) IVPB 3.375 g        3.375 g 12.5 mL/hr over 240 Minutes Intravenous Every 8 hours 10/18/24 2319 10/24/24 0559   10/18/24 2345  piperacillin -tazobactam (ZOSYN ) IVPB 3.375 g        3.375 g 100 mL/hr over 30 Minutes Intravenous  Once 10/18/24 2333 10/19/24 0016   10/18/24 2330  azithromycin (ZITHROMAX) 500 mg in sodium chloride  0.9 % 250 mL IVPB  Status:  Discontinued        500 mg 250 mL/hr over 60 Minutes Intravenous  Once 10/18/24 2317 10/18/24 2324   10/18/24 2330  vancomycin  (VANCOREADY) IVPB 2000 mg/400 mL        2,000 mg 200 mL/hr over 120 Minutes Intravenous  Once 10/18/24 2319 10/19/24 0313   10/18/24 2315  cefTRIAXone  (ROCEPHIN ) 1 g in sodium chloride  0.9 % 100 mL IVPB  Status:  Discontinued        1 g 200 mL/hr over 30 Minutes Intravenous  Once 10/18/24 2300 10/18/24 2303   10/18/24 2315  azithromycin (ZITHROMAX) 500 mg in sodium chloride  0.9 % 250 mL IVPB  Status:  Discontinued  500 mg 250 mL/hr over 60 Minutes Intravenous  Once 10/18/24 2300 10/18/24 2303   10/18/24 2315  piperacillin -tazobactam (ZOSYN ) IVPB 3.375 g  Status:  Discontinued        3.375 g 100 mL/hr over 30 Minutes Intravenous   Once 10/18/24 2306 10/18/24 2332       Assessment/Plan: s/p * No surgery found * Possible bone in sigmoid colon with inflammation. Does not seem to be perforated since there is no abd pain. Wbc elevation could be due to this or pneumonia. Would continue bowel rest for now and IV abx Will follow closely  LOS: 1 day    Deward Null III 10/19/2024

## 2024-10-19 NOTE — Plan of Care (Signed)

## 2024-10-19 NOTE — Progress Notes (Signed)
 PROGRESS NOTE    [name] [name]  FMW:[id number] DOB: 06-May-1951 DOA: 10/18/2024 PCP: [name], MD    Chief Complaint  Patient presents with   Emesis   Loss of Consciousness    Brief Narrative:  Patient 73 year old gentleman history of paroxysmal A-fib, a flutter, chronic diastolic heart failure, CAD status post stent placement, SSS, COPD, GERD, seizure, BPH, hypogonadism, gout, chronic tinnitus and vertigo, NIDDM type II presented to the ED with episode of nausea vomiting abdominal discomfort and presyncopal episode.  Patient seen in the ED noted to be febrile with a temp of 102, tachypneic otherwise hemodynamically stable.  Lab work concerning for leukocytosis.  CT abdomen and pelvis done concerning for intraluminal foreign body with concerns for colonic perforation and moderate surrounding inflammatory changes as well as diverticulosis, patchy bibasilar airspace infiltrates consistent with acute multifocal infection.  CT head unremarkable.  Patient placed on empiric IV antibiotics.  General surgery consulted.   Assessment & Plan:   Principal Problem:   Acute diverticulitis Active Problems:   Multifocal pneumonia   Foreign body alimentary tract-questionable   Paroxysmal atrial fibrillation (HCC)   Chronic vertigo   Sick sinus syndrome (HCC)   COPD (chronic obstructive pulmonary disease) (HCC)   Syncope and collapse   Chronic diastolic CHF (congestive heart failure) (HCC)   History of CAD (coronary artery disease)   History of seizure   Non-insulin dependent type 2 diabetes mellitus (HCC)  #1 sepsis in the setting of probable acute diverticulitis and multifocal pneumonia/question foreign body in sigmoid colon  - Patient on admission had presented with sudden onset abdominal pain, nausea vomiting presyncopal episode. - Patient on admission noted to be tachypneic, noted to have a leukocytosis, afebrile with a temp of 102. - Patient with slightly elevated lactic acid level  that has improved. -- CT abdomen pelvis showing Wire-like metallic intraluminal foreign body (~6 cm) perforating the sigmoid colon at both ends with moderate surrounding inflammatory change. No free intraperitoneal gas or fluid, no pericolonic fluid collection, and no bowel obstruction. Surgical consultation recommended.  Moderate descending and sigmoid diverticulosis.  - Patient with no acute abdominal signs or symptoms on examination without any rebound or guarding or abdominal rigidity. - Abdominal exam benign. - Per admitting physician EDP discussed with general surgery who felt there is no suspicion of foreign body or associated perforation as patient with no signs of perforation on examination and recommended treatment for diverticulitis with serial abdominal exams. - General Surgery noted to have evaluated patient on admission with concern for  possible chicken bone foreign body in the sigmoid colon.  Recommended to keep n.p.o. and bowel rest.  IV antibiotics Zosyn .  Serial exam. Most likely will consider repeating CAT scan to see if the foreign body has passed. If not or patient worse, may require operative resection and Hartmann resection if he truly has perforated his colon. There is no need for emergency surgery tonight but surgery will help follow.  - Patient seen by general surgery today and felt abdominal exam benign and recommending bowel rest with continuation of IV antibiotics. - Repeat CT abdomen and pelvis done with stable CT, unchanged position of hyperdense 6 cm curvilinear abdomen foreign body in the proximal sigmoid colon with apparent mural perforations, associated wall thickening, surrounding fat stranding, no pericolonic free air or measurable free fluid. -Continue empiric IV antibiotics. -Bowel rest. - General Surgery following and appreciate the input and recommendations.  2.  Multifocal pneumonia -Noted on CT abdomen and pelvis. - Patient  noted to have met criteria for  sepsis with leukocytosis, tachypnea, fever. - Continue empiric IV vancomycin , IV Zosyn  in the setting of treating pneumonia as well as question foreign body in colon. -Placed on Pulmicort , - Supportive care.  3.  History of atrial flutter/paroxysmal A-fib -Currently normal sinus rhythm. - Continue Toprol -XL 25 mg daily for rate control. - Patient noted to have been prescribed amiodarone  however per admitting physician per chart review patient currently is not on amiodarone  we will hold off on reinitiating amiodarone  at this time. - It is noted that amiodarone  had not been refilled over the past 6 months. - Patient noted to not be a anticoagulation candidate due to history of recurrent falls from chronic tinnitus and vertigo. - Continue aspirin  81 mg daily.  4.  History of SSS -Outpatient follow-up.  5.  Chronic diastolic heart failure -Continue Toprol -XL, digoxin . - Continue to hold Lasix  and losartan.  6.  History of COPD -Stable. - DuoNebs as needed.  7.  History of CAD -Stable - Continue Toprol -XL.  8.  GERD -IV PPI.  9.  History of seizure -Continue home regimen Lamictal .  10.  Non-insulin-dependent diabetes mellitus type 2 -Hemoglobin A1c noted at 5.9 (02/01/2024) - Repeat hemoglobin A1c -Noted to be on Jardiance  prior to admission which we will continue to hold in house. - Follow-up.     DVT prophylaxis: SCDs Code Status: Full Family Communication: Updated patient.  No family at bedside. Disposition: TBD  Status is: Inpatient Remains inpatient appropriate because: Severity of illness   Consultants:  General Surgery: Dr. Sheldon 10/18/2024  Procedures:  CT abdomen and pelvis 10/18/2024, 10/19/2024 CT head 10/18/2024 Abdominal films 10/18/2024, 10/19/2024  Antimicrobials:  Anti-infectives (From admission, onward)    Start     Dose/Rate Route Frequency Ordered Stop   10/19/24 2330  vancomycin  (VANCOCIN ) IVPB 1000 mg/200 mL premix        1,000 mg 200  mL/hr over 60 Minutes Intravenous Every 24 hours 10/18/24 2321     10/19/24 0600  piperacillin -tazobactam (ZOSYN ) IVPB 3.375 g        3.375 g 12.5 mL/hr over 240 Minutes Intravenous Every 8 hours 10/18/24 2319 10/24/24 0559   10/18/24 2345  piperacillin -tazobactam (ZOSYN ) IVPB 3.375 g        3.375 g 100 mL/hr over 30 Minutes Intravenous  Once 10/18/24 2333 10/19/24 0016   10/18/24 2330  azithromycin (ZITHROMAX) 500 mg in sodium chloride  0.9 % 250 mL IVPB  Status:  Discontinued        500 mg 250 mL/hr over 60 Minutes Intravenous  Once 10/18/24 2317 10/18/24 2324   10/18/24 2330  vancomycin  (VANCOREADY) IVPB 2000 mg/400 mL        2,000 mg 200 mL/hr over 120 Minutes Intravenous  Once 10/18/24 2319 10/19/24 0931   10/18/24 2315  cefTRIAXone  (ROCEPHIN ) 1 g in sodium chloride  0.9 % 100 mL IVPB  Status:  Discontinued        1 g 200 mL/hr over 30 Minutes Intravenous  Once 10/18/24 2300 10/18/24 2303   10/18/24 2315  azithromycin (ZITHROMAX) 500 mg in sodium chloride  0.9 % 250 mL IVPB  Status:  Discontinued        500 mg 250 mL/hr over 60 Minutes Intravenous  Once 10/18/24 2300 10/18/24 2303   10/18/24 2315  piperacillin -tazobactam (ZOSYN ) IVPB 3.375 g  Status:  Discontinued        3.375 g 100 mL/hr over 30 Minutes Intravenous  Once 10/18/24 2306 10/18/24 2332  Subjective: Patient sleeping easily arousable.  Denies any chest pain or significant shortness of breath.  Denies any significant abdominal pain.  Did state he was eating rice and chicken and chili concoction mixed up prior to admission and felt a little bit somewhat lightheaded.  Objective: Vitals:   10/19/24 0439 10/19/24 0819 10/19/24 0847 10/19/24 1403  BP: 132/73 (!) 122/58  (!) 150/73  Pulse: 66 64  64  Resp: 20   16  Temp: 98.6 F (37 C)   98.2 F (36.8 C)  TempSrc: Oral   Oral  SpO2: 100%  100% 100%  Weight:      Height:        Intake/Output Summary (Last 24 hours) at 10/19/2024 1836 Last data filed at  10/19/2024 1400 Gross per 24 hour  Intake 150 ml  Output 900 ml  Net -750 ml   Filed Weights   10/18/24 1742  Weight: 106.6 kg    Examination:  General exam: Appears calm and comfortable  Respiratory system: Clear to auscultation anterior lung fields. Respiratory effort normal. Cardiovascular system: S1 & S2 heard, RRR. No JVD, murmurs, rubs, gallops or clicks. No pedal edema. Gastrointestinal system: Abdomen is nondistended, soft and nontender. No organomegaly or masses felt. Normal bowel sounds heard. Central nervous system: Alert and oriented. No focal neurological deficits. Extremities: Symmetric 5 x 5 power. Skin: No rashes, lesions or ulcers Psychiatry: Judgement and insight appear normal. Mood & affect appropriate.     Data Reviewed: I have personally reviewed following labs and imaging studies  CBC: Recent Labs  Lab 10/18/24 2006 10/19/24 0424  WBC 15.2* 24.8*  NEUTROABS 13.2*  --   HGB 12.1* 9.7*  HCT 42.2 31.9*  MCV 73.9* 73.8*  PLT 395 306    Basic Metabolic Panel: Recent Labs  Lab 10/18/24 2006 10/19/24 0424  NA 142 139  K 4.0 4.1  CL 104 105  CO2 25 24  GLUCOSE 81 105*  BUN 13 15  CREATININE 1.40* 1.51*  CALCIUM  9.4 8.6*  MG 2.1  --     GFR: Estimated Creatinine Clearance: 49.2 mL/min (A) (by C-G formula based on SCr of 1.51 mg/dL (H)).  Liver Function Tests: Recent Labs  Lab 10/18/24 2006 10/19/24 0424  AST 28 21  ALT 13 8  ALKPHOS 98 75  BILITOT 0.5 0.5  PROT 8.6* 6.6  ALBUMIN  3.7 2.9*    CBG: No results for input(s): GLUCAP in the last 168 hours.   Recent Results (from the past 240 hours)  Resp panel by RT-PCR (RSV, Flu A&B, Covid) Anterior Nasal Swab     Status: None   Collection Time: 10/18/24 10:51 PM   Specimen: Anterior Nasal Swab  Result Value Ref Range Status   SARS Coronavirus 2 by RT PCR NEGATIVE NEGATIVE Final    Comment: (NOTE) SARS-CoV-2 target nucleic acids are NOT DETECTED.  The SARS-CoV-2 RNA is  generally detectable in upper respiratory specimens during the acute phase of infection. The lowest concentration of SARS-CoV-2 viral copies this assay can detect is 138 copies/mL. A negative result does not preclude SARS-Cov-2 infection and should not be used as the sole basis for treatment or other patient management decisions. A negative result may occur with  improper specimen collection/handling, submission of specimen other than nasopharyngeal swab, presence of viral mutation(s) within the areas targeted by this assay, and inadequate number of viral copies(<138 copies/mL). A negative result must be combined with clinical observations, patient history, and epidemiological information. The expected result is  Negative.  Fact Sheet for Patients:  bloggercourse.com  Fact Sheet for Healthcare Providers:  seriousbroker.it  This test is no t yet approved or cleared by the [place]  FDA and  has been authorized for detection and/or diagnosis of SARS-CoV-2 by FDA under an Emergency Use Authorization (EUA). This EUA will remain  in effect (meaning this test can be used) for the duration of the COVID-19 declaration under Section 564(b)(1) of the Act, 21 U.S.C.section 360bbb-3(b)(1), unless the authorization is terminated  or revoked sooner.       Influenza A by PCR NEGATIVE NEGATIVE Final   Influenza B by PCR NEGATIVE NEGATIVE Final    Comment: (NOTE) The Xpert Xpress SARS-CoV-2/FLU/RSV plus assay is intended as an aid in the diagnosis of influenza from Nasopharyngeal swab specimens and should not be used as a sole basis for treatment. Nasal washings and aspirates are unacceptable for Xpert Xpress SARS-CoV-2/FLU/RSV testing.  Fact Sheet for Patients: bloggercourse.com  Fact Sheet for Healthcare Providers: seriousbroker.it  This test is not yet approved or cleared by the [place] FDA and has been authorized for detection and/or diagnosis of SARS-CoV-2 by FDA under an Emergency Use Authorization (EUA). This EUA will remain in effect (meaning this test can be used) for the duration of the COVID-19 declaration under Section 564(b)(1) of the Act, 21 U.S.C. section 360bbb-3(b)(1), unless the authorization is terminated or revoked.     Resp Syncytial Virus by PCR NEGATIVE NEGATIVE Final    Comment: (NOTE) Fact Sheet for Patients: bloggercourse.com  Fact Sheet for Healthcare Providers: seriousbroker.it  This test is not yet approved or cleared by the [place]  FDA and has been authorized for detection and/or diagnosis of SARS-CoV-2 by FDA under an Emergency Use Authorization (EUA). This EUA will remain in effect (meaning this test can be used) for the duration of the COVID-19 declaration under Section 564(b)(1) of the Act, 21 U.S.C. section 360bbb-3(b)(1), unless the authorization is terminated or revoked.  Performed at [facility], [address], [place], [place] [address]   Culture, blood (Routine X 2) w Reflex to ID Panel     Status: None (Preliminary result)   Collection Time: 10/18/24 11:40 PM   Specimen: BLOOD  Result Value Ref Range Status   Specimen Description   Final    BLOOD RIGHT ANTECUBITAL Performed at [facility], [address], [place], [place] [address]    Special Requests   Final    BOTTLES DRAWN AEROBIC AND ANAEROBIC Blood Culture results may not be optimal due to an inadequate volume of blood received in culture bottles Performed at [facility], [address] [place], [place] [address]    Culture   Final    NO GROWTH < 12 HOURS Performed at [facility], [address], [place], [place] [address]    Report Status PENDING  Incomplete  Respiratory (~20 pathogens) panel by PCR     Status: None   Collection  Time: 10/18/24 11:58 PM   Specimen: Nasopharyngeal Swab; Respiratory  Result Value Ref Range Status   Adenovirus NOT DETECTED NOT DETECTED Final   Coronavirus 229E NOT DETECTED NOT DETECTED Final    Comment: (NOTE) The Coronavirus on the Respiratory Panel, DOES NOT test for the novel  Coronavirus (2019 nCoV)    Coronavirus HKU1 NOT DETECTED NOT DETECTED Final   Coronavirus NL63 NOT DETECTED NOT DETECTED Final   Coronavirus OC43 NOT DETECTED NOT DETECTED Final   Metapneumovirus NOT DETECTED  NOT DETECTED Final   Rhinovirus / Enterovirus NOT DETECTED NOT DETECTED Final   Influenza A NOT DETECTED NOT DETECTED Final   Influenza B NOT DETECTED NOT DETECTED Final   Parainfluenza Virus 1 NOT DETECTED NOT DETECTED Final   Parainfluenza Virus 2 NOT DETECTED NOT DETECTED Final   Parainfluenza Virus 3 NOT DETECTED NOT DETECTED Final   Parainfluenza Virus 4 NOT DETECTED NOT DETECTED Final   Respiratory Syncytial Virus NOT DETECTED NOT DETECTED Final   Bordetella pertussis NOT DETECTED NOT DETECTED Final   Bordetella Parapertussis NOT DETECTED NOT DETECTED Final   Chlamydophila pneumoniae NOT DETECTED NOT DETECTED Final   Mycoplasma pneumoniae NOT DETECTED NOT DETECTED Final    Comment: Performed at [facility], [address] [place], [place] [address]  Culture, blood (Routine X 2) w Reflex to ID Panel     Status: None (Preliminary result)   Collection Time: 10/19/24  1:43 AM   Specimen: BLOOD  Result Value Ref Range Status   Specimen Description   Final    BLOOD BLOOD RIGHT HAND Performed at [facility], [address] [place], [place] [address]    Special Requests   Final    BOTTLES DRAWN AEROBIC AND ANAEROBIC Blood Culture adequate volume Performed at [facility], [address] [place], [place] [address]    Culture   Final    NO GROWTH < 12 HOURS Performed at [facility], [address] [place], [place] [address]    Report  Status PENDING  Incomplete         Radiology Studies: CT ABDOMEN PELVIS WO CONTRAST Result Date: 10/19/2024 EXAM: CT ABDOMEN AND PELVIS WITHOUT CONTRAST 10/19/2024 01:52:54 PM TECHNIQUE: CT of the abdomen and pelvis was performed without the administration of intravenous contrast. Multiplanar reformatted images are provided for review. Automated exposure control, iterative reconstruction, and/or weight-based adjustment of the mA/kV was utilized to reduce the radiation dose to as low as reasonably achievable. COMPARISON: CT abdomen and pelvis dated 10/18/2024. CLINICAL HISTORY: Diverticulitis, complication suspected; Follow-up with passing of the foreign body. F/u foreign body. FINDINGS: LOWER CHEST: Moderate patchy ground glass opacity and minimal patchy consolidation at both lung bases, slightly improved. LIVER: The liver is unremarkable. GALLBLADDER AND BILE DUCTS: Gallbladder is unremarkable. No biliary ductal dilatation. SPLEEN: No acute abnormality. PANCREAS: No acute abnormality. ADRENAL GLANDS: No acute abnormality. KIDNEYS, URETERS AND BLADDER: No stones in the kidneys or ureters. No hydronephrosis. No perinephric or periureteral stranding. Urinary bladder is unremarkable. GI AND BOWEL: Small hiatal hernia, unchanged. Otherwise normal nondistended stomach. No change in the position of the hyperdense curvilinear 6 cm apparent foreign body in the proximal sigmoid colon with apparent perforation of the sigmoid colon wall superiorly and inferiorly with associated stable wall thickening and surrounding fat stranding. No pericolonic free air or measurable fluid collection. Moderate underlying sigmoid diverticulosis. No dilated or thick walled small bowel loops. Normal appendix. PERITONEUM AND RETROPERITONEUM: No ascites. No free air. VASCULATURE: Aorta is normal in caliber. LYMPH NODES: No lymphadenopathy. REPRODUCTIVE ORGANS: No acute abnormality. BONES AND SOFT TISSUES: Moderate lumbar spondylosis. No  acute osseous abnormality. No focal soft tissue abnormality. IMPRESSION: 1. Stable CT compared to yesterday. Unchanged position of hyperdense 6 cm curvilinear apparent foreign body in the proximal sigmoid colon with apparent mural perforations, associated wall thickening, and surrounding fat stranding; no pericolonic free air or measurable fluid collection. Electronically signed by: Selinda Blue MD 10/19/2024 02:36 PM EDT RP Workstation: [id number]  DG Abd Portable 1V Result Date: 10/19/2024 EXAM: 1 VIEW XRAY OF THE ABDOMEN 10/19/2024 04:42:00 AM COMPARISON: CT abdomen and pelvis yesterday. CLINICAL HISTORY: Foreign, possible chicken bone in sigmoid colon. FINDINGS: LINES, TUBES AND DEVICES: EKG leads noted. BOWEL: Nonobstructive bowel gas pattern. The left lower quadrant curvilinear foreign body appearing lesion located in the sigmoid colon is not apparent radiographically. SOFT TISSUES: No opaque urinary calculi. BONES: Degenerative changes of lumbar spine. No acute osseous abnormality. IMPRESSION: 1. CT suspected curvilinear foreign body in the sigmoid colon not radiographically visible. 2. Nonobstructive bowel gas pattern. Electronically signed by: Helayne Hurst MD 10/19/2024 04:53 AM EDT RP Workstation: [id number]   CT ABDOMEN PELVIS WO CONTRAST Result Date: 10/18/2024 EXAM: CT ABDOMEN AND PELVIS WITHOUT CONTRAST 10/18/2024 10:43:25 PM TECHNIQUE: CT of the abdomen and pelvis was performed without the administration of intravenous contrast. Multiplanar reformatted images are provided for review. Automated exposure control, iterative reconstruction, and/or weight-based adjustment of the mA/kV was utilized to reduce the radiation dose to as low as reasonably achievable. COMPARISON: None available. CLINICAL HISTORY: Diverticulitis, complication suspected. FINDINGS: LOWER CHEST: Patchy bibasilar airspace infiltrates are present in keeping with multifocal infection in the acute setting. LIVER: The liver is  unremarkable. GALLBLADDER AND BILE DUCTS: Gallbladder is unremarkable. No biliary ductal dilatation. A small hiatal hernia is present. SPLEEN: No acute abnormality. PANCREAS: No acute abnormality. ADRENAL GLANDS: No acute abnormality. KIDNEYS, URETERS AND BLADDER: No stones in the kidneys or ureters. No hydronephrosis. No perinephric or periureteral stranding. Urinary bladder is unremarkable. GI AND BOWEL: A wire-like metallic foreign body measuring roughly 6 cm in length has perforated the wall of the sigmoid colon at both ends with moderate surrounding inflammatory change identified. This is best seen on axial images 64/2 through 69/2. There is moderate background descending and sigmoid colonic diverticulosis. No free intraperitoneal gas or fluid. No pericolonic fluid collection identified. No evidence of obstruction. The stomach, small bowel, and large bowel are otherwise unremarkable. The appendix is normal. PERITONEUM AND RETROPERITONEUM: No ascites. No free air. VASCULATURE: Mild aortoiliac atherosclerotic calcification. No aortic aneurysm. LYMPH NODES: No lymphadenopathy. REPRODUCTIVE ORGANS: No acute abnormality. BONES AND SOFT TISSUES: Moderate right and severe left hip degenerative arthritis. Degenerative changes are seen within the visualized thoracolumbar spine. No acute bone abnormality. No lytic or blastic bone lesion. IMPRESSION: 1. Wire-like metallic intraluminal foreign body (~6 cm) perforating the sigmoid colon at both ends with moderate surrounding inflammatory change. No free intraperitoneal gas or fluid, no pericolonic fluid collection, and no bowel obstruction. Surgical consultation recommended. 2. Moderate descending and sigmoid diverticulosis. 3. Patchy bibasilar airspace infiltrates consistent with acute multifocal infection. Electronically signed by: Dorethia Molt MD 10/18/2024 10:55 PM EDT RP Workstation: [id number]   CT HEAD WO CONTRAST Result Date: 10/18/2024 EXAM: CT HEAD WITHOUT  CONTRAST 10/18/2024 06:56:18 PM TECHNIQUE: CT of the head was performed without the administration of intravenous contrast. Automated exposure control, iterative reconstruction, and/or weight based adjustment of the mA/kV was utilized to reduce the radiation dose to as low as reasonably achievable. COMPARISON: 04/17/2017 CLINICAL HISTORY: Mental status change, unknown cause. Pt BIB ems after having a syncope episode and vomiting after waking up on his dinning table. Pt states feeling weak and not well today. VS stable. Hx of afib. FINDINGS: BRAIN AND VENTRICLES: No acute hemorrhage. No evidence of acute infarct. Subcortical and periventricular small vessel ischemic changes, particularly in the subcortical right frontal lobe. No hydrocephalus. No extra-axial collection. No mass effect or midline shift. ORBITS: No acute abnormality. SINUSES:  No acute abnormality. SOFT TISSUES AND SKULL: No acute soft tissue abnormality. No skull fracture. IMPRESSION: 1. No acute intracranial abnormality. Electronically signed by: Pinkie Pebbles MD 10/18/2024 07:02 PM EDT RP Workstation: [id number]   DG Chest Port 1 View Result Date: 10/18/2024 EXAM: 1 VIEW XRAY OF THE CHEST 01/20/2019 COMPARISON: None available. CLINICAL HISTORY: LH?SABRA Syncope episode and vomiting after waking up on his dinning table. Hx of afib FINDINGS: LUNGS AND PLEURA: No focal pulmonary opacity. No pulmonary edema. No pleural effusion. No pneumothorax. HEART AND MEDIASTINUM: No acute abnormality of the cardiac and mediastinal silhouettes. BONES AND SOFT TISSUES: No acute osseous abnormality. IMPRESSION: 1. No acute process. Electronically signed by: Greig Pique MD 10/18/2024 06:41 PM EDT RP Workstation: [id number]        Scheduled Meds:  atorvastatin   40 mg Oral Daily   budesonide  (PULMICORT ) nebulizer solution  0.5 mg Nebulization BID   digoxin   0.125 mg Oral Daily   ferrous sulfate   325 mg Oral Q breakfast   lamoTRIgine   100 mg Oral BID    metoprolol  succinate  25 mg Oral Daily   pantoprazole  (PROTONIX ) IV  40 mg Intravenous Q24H   sodium chloride  flush  3 mL Intravenous Q12H   tamsulosin   0.4 mg Oral Daily   Continuous Infusions:  lactated ringers      lactated ringers  150 mL/hr at 10/19/24 0935   piperacillin -tazobactam (ZOSYN )  IV 3.375 g (10/19/24 1647)   vancomycin        LOS: 1 day    Time spent: 40 minutes    Toribio Hummer, MD [facility]   To contact the attending provider between 7A-7P or the covering provider during after hours 7P-7A, please log into the web site [email or web address] and access using universal [place] password for that web site. If you do not have the password, please call the hospital operator.  10/19/2024, 6:36 PM

## 2024-10-19 NOTE — Plan of Care (Signed)
 8 lactic acid 2.1.  Patient received 1 L of LR bolus earlier.  Hemodynamically stable.  Giving second liter of LR bolus followed by continue maintenance fluid LR 150 cc/h.

## 2024-10-19 NOTE — Progress Notes (Signed)
 Notified MD Sebastian of of critical Lactic Acid repeat called from lab of 2.1 via telephone. Pt w/ no N/V or abd pain at this time.

## 2024-10-20 DIAGNOSIS — J438 Other emphysema: Secondary | ICD-10-CM | POA: Diagnosis not present

## 2024-10-20 DIAGNOSIS — K5792 Diverticulitis of intestine, part unspecified, without perforation or abscess without bleeding: Secondary | ICD-10-CM | POA: Diagnosis not present

## 2024-10-20 DIAGNOSIS — I5032 Chronic diastolic (congestive) heart failure: Secondary | ICD-10-CM | POA: Diagnosis not present

## 2024-10-20 DIAGNOSIS — T189XXD Foreign body of alimentary tract, part unspecified, subsequent encounter: Secondary | ICD-10-CM | POA: Diagnosis not present

## 2024-10-20 LAB — BASIC METABOLIC PANEL WITH GFR
Anion gap: 12 (ref 5–15)
BUN: 15 mg/dL (ref 8–23)
CO2: 20 mmol/L — ABNORMAL LOW (ref 22–32)
Calcium: 8.9 mg/dL (ref 8.9–10.3)
Chloride: 104 mmol/L (ref 98–111)
Creatinine, Ser: 1.48 mg/dL — ABNORMAL HIGH (ref 0.61–1.24)
GFR, Estimated: 49 mL/min — ABNORMAL LOW (ref >60)
Glucose, Bld: 89 mg/dL (ref 70–99)
Potassium: 4.6 mmol/L (ref 3.5–5.1)
Sodium: 136 mmol/L (ref 135–145)

## 2024-10-20 LAB — CBC
HCT: 36.6 % — ABNORMAL LOW (ref 39.0–52.0)
Hemoglobin: 11 g/dL — ABNORMAL LOW (ref 13.0–17.0)
MCH: 21.4 pg — ABNORMAL LOW (ref 26.0–34.0)
MCHC: 30.1 g/dL (ref 30.0–36.0)
MCV: 71.2 fL — ABNORMAL LOW (ref 80.0–100.0)
Platelets: 264 K/uL (ref 150–400)
RBC: 5.14 MIL/uL (ref 4.22–5.81)
RDW: 16.1 % — ABNORMAL HIGH (ref 11.5–15.5)
WBC: 15.3 K/uL — ABNORMAL HIGH (ref 4.0–10.5)
nRBC: 0 % (ref 0.0–0.2)

## 2024-10-20 LAB — HEMOGLOBIN A1C
Hgb A1c MFr Bld: 5.3 % (ref 4.8–5.6)
Mean Plasma Glucose: 105.41 mg/dL

## 2024-10-20 LAB — MRSA NEXT GEN BY PCR, NASAL: MRSA by PCR Next Gen: DETECTED — AB

## 2024-10-20 MED ORDER — CHLORHEXIDINE GLUCONATE CLOTH 2 % EX PADS
6.0000 | MEDICATED_PAD | Freq: Every day | CUTANEOUS | Status: DC
  Administered 2024-10-20 – 2024-10-23 (×3): 6 via TOPICAL

## 2024-10-20 MED ORDER — MUPIROCIN 2 % EX OINT
1.0000 | TOPICAL_OINTMENT | Freq: Two times a day (BID) | CUTANEOUS | Status: DC
  Administered 2024-10-20 – 2024-10-23 (×7): 1 via NASAL
  Filled 2024-10-20 (×2): qty 22

## 2024-10-20 MED ORDER — PEG 3350-KCL-NA BICARB-NACL 420 G PO SOLR
4000.0000 mL | Freq: Once | ORAL | Status: AC
  Administered 2024-10-20: 4000 mL via ORAL

## 2024-10-20 MED ORDER — LACTATED RINGERS IV SOLN: INTRAVENOUS | Status: AC

## 2024-10-20 NOTE — Plan of Care (Signed)

## 2024-10-20 NOTE — Progress Notes (Signed)
 PROGRESS NOTE    [name] [name]  FMW:[id number] DOB: [birth year] DOA: 10/18/2024 PCP: [name], MD    Chief Complaint  Patient presents with   Emesis   Loss of Consciousness    Brief Narrative:  Patient 73 year old gentleman history of paroxysmal A-fib, a flutter, chronic diastolic heart failure, CAD status post stent placement, SSS, COPD, GERD, seizure, BPH, hypogonadism, gout, chronic tinnitus and vertigo, NIDDM type II presented to the ED with episode of nausea vomiting abdominal discomfort and presyncopal episode.  Patient seen in the ED noted to be febrile with a temp of 102, tachypneic otherwise hemodynamically stable.  Lab work concerning for leukocytosis.  CT abdomen and pelvis done concerning for intraluminal foreign body with concerns for colonic perforation and moderate surrounding inflammatory changes as well as diverticulosis, patchy bibasilar airspace infiltrates consistent with acute multifocal infection.  CT head unremarkable.  Patient placed on empiric IV antibiotics.  General surgery consulted.   Assessment & Plan:   Principal Problem:   Acute diverticulitis Active Problems:   Multifocal pneumonia   Foreign body alimentary tract-questionable   Paroxysmal atrial fibrillation (HCC)   Chronic vertigo   Sick sinus syndrome (HCC)   COPD (chronic obstructive pulmonary disease) (HCC)   Syncope and collapse   Chronic diastolic CHF (congestive heart failure) (HCC)   History of CAD (coronary artery disease)   History of seizure   Non-insulin dependent type 2 diabetes mellitus (HCC)  #1 sepsis in the setting of probable acute diverticulitis and multifocal pneumonia/question foreign body in sigmoid colon  - Patient on admission had presented with sudden onset abdominal pain, nausea vomiting presyncopal episode. - Patient on admission noted to be tachypneic, noted to have a leukocytosis, afebrile with a temp of 102. - Patient with slightly elevated lactic acid level  that has improved. -- CT abdomen pelvis showing Wire-like metallic intraluminal foreign body (~6 cm) perforating the sigmoid colon at both ends with moderate surrounding inflammatory change. No free intraperitoneal gas or fluid, no pericolonic fluid collection, and no bowel obstruction. Surgical consultation recommended.  Moderate descending and sigmoid diverticulosis.  - Patient with no acute abdominal signs or symptoms on examination without any rebound or guarding or abdominal rigidity. - Abdominal exam benign. - Per admitting physician EDP discussed with general surgery who felt there is no suspicion of foreign body or associated perforation as patient with no signs of perforation on examination and recommended treatment for diverticulitis with serial abdominal exams. - General Surgery noted to have evaluated patient on admission with concern for  possible chicken bone foreign body in the sigmoid colon.  Recommended to keep n.p.o. and bowel rest.  IV antibiotics Zosyn .  Serial exam. Most likely will consider repeating CAT scan to see if the foreign body has passed. If not or patient worse, may require operative resection and Hartmann resection if he truly has perforated his colon. There is no need for emergency surgery tonight but surgery will help follow.  - Repeat CT abdomen and pelvis done with stable CT, unchanged position of hyperdense 6 cm curvilinear abdomen foreign body in the proximal sigmoid colon with apparent mural perforations, associated wall thickening, surrounding fat stranding, no pericolonic free air or measurable free fluid. -- Patient seen by general surgery and patient with a benign abdominal exam.   - Patient placed on clear liquids and GoLytely  bowel prep ordered and patient for repeat CT tomorrow morning,10/21/2024, per general surgery.  -Continue empiric IV antibiotics. -Per general surgery if repeat CT scan in the  morning shows foreign body has not moved then may need to  consider flex sigmoidoscopy for retrieval. -General surgery feels patient is at risk for progressing to frank perforation at any point which would lead to surgery for resection of the segment of colon. - General Surgery following and appreciate the input and recommendations.  2.  Multifocal pneumonia -Noted on CT abdomen and pelvis. - Patient noted to have met criteria for sepsis with leukocytosis, tachypnea, fever. - MRSA PCR detected.   - Blood cultures with no growth to date.   - Continue IV vancomycin , IV Zosyn  in the setting of treating pneumonia as well as foreign body in colon.   - If blood cultures remain negative tomorrow we will discontinue IV vancomycin .   - Continue Pulmicort , nebs as needed  - Supportive care.  3.  History of atrial flutter/paroxysmal A-fib -Currently normal sinus rhythm. - Continue digoxin , Toprol -XL 25 mg daily for rate control. - Patient noted to have been prescribed amiodarone  however per admitting physician per chart review patient currently is not on amiodarone  we will hold off on reinitiating amiodarone  at this time. - It is noted that amiodarone  had not been refilled over the past 6 months. - Patient noted to not be a anticoagulation candidate due to history of recurrent falls from chronic tinnitus and vertigo. - Continue aspirin  81 mg daily.  4.  History of SSS -Outpatient follow-up.  5.  Chronic diastolic heart failure - Continue digoxin , Toprol -XL.   - Continue to hold Lasix  and losartan.   6.  History of COPD -Stable. - DuoNebs as needed.  7.  History of CAD -Stable - Continue Toprol -XL.  8.  GERD -IV PPI.  9.  History of seizure -Lamictal .    10.  Non-insulin-dependent diabetes mellitus type 2 -Hemoglobin A1c noted at 5.9 (02/01/2024) - Repeat hemoglobin A1c 5.3. -Noted to be on Jardiance  prior to admission which we will continue to hold in house. - Outpatient follow-up.     DVT prophylaxis: SCDs Code Status: Full Family  Communication: Updated patient.  No family at bedside. Disposition: TBD  Status is: Inpatient Remains inpatient appropriate because: Severity of illness   Consultants:  General Surgery: Dr. Sheldon 10/18/2024  Procedures:  CT abdomen and pelvis 10/18/2024, 10/19/2024 CT head 10/18/2024 Abdominal films 10/18/2024, 10/19/2024  Antimicrobials:  Anti-infectives (From admission, onward)    Start     Dose/Rate Route Frequency Ordered Stop   10/19/24 2330  vancomycin  (VANCOCIN ) IVPB 1000 mg/200 mL premix        1,000 mg 200 mL/hr over 60 Minutes Intravenous Every 24 hours 10/18/24 2321     10/19/24 0600  piperacillin -tazobactam (ZOSYN ) IVPB 3.375 g        3.375 g 12.5 mL/hr over 240 Minutes Intravenous Every 8 hours 10/18/24 2319 10/24/24 0559   10/18/24 2345  piperacillin -tazobactam (ZOSYN ) IVPB 3.375 g        3.375 g 100 mL/hr over 30 Minutes Intravenous  Once 10/18/24 2333 10/19/24 0016   10/18/24 2330  azithromycin (ZITHROMAX) 500 mg in sodium chloride  0.9 % 250 mL IVPB  Status:  Discontinued        500 mg 250 mL/hr over 60 Minutes Intravenous  Once 10/18/24 2317 10/18/24 2324   10/18/24 2330  vancomycin  (VANCOREADY) IVPB 2000 mg/400 mL        2,000 mg 200 mL/hr over 120 Minutes Intravenous  Once 10/18/24 2319 10/19/24 0931   10/18/24 2315  cefTRIAXone  (ROCEPHIN ) 1 g in sodium chloride  0.9 % 100  mL IVPB  Status:  Discontinued        1 g 200 mL/hr over 30 Minutes Intravenous  Once 10/18/24 2300 10/18/24 2303   10/18/24 2315  azithromycin (ZITHROMAX) 500 mg in sodium chloride  0.9 % 250 mL IVPB  Status:  Discontinued        500 mg 250 mL/hr over 60 Minutes Intravenous  Once 10/18/24 2300 10/18/24 2303   10/18/24 2315  piperacillin -tazobactam (ZOSYN ) IVPB 3.375 g  Status:  Discontinued        3.375 g 100 mL/hr over 30 Minutes Intravenous  Once 10/18/24 2306 10/18/24 2332         Subjective: Patient sitting up on the side of the bed tolerating clear liquids.  Asking whether he  can eat applesauce.  Denies any chest pain or significant shortness of breath.  No abdominal pain.   Objective: Vitals:   10/19/24 2124 10/20/24 0111 10/20/24 0822 10/20/24 0930  BP: 138/73 113/63    Pulse: (!) 52 (!) 54 (!) 54   Resp:      Temp: 98 F (36.7 C) 97.9 F (36.6 C)    TempSrc: Oral Oral    SpO2: 100% 100%  100%  Weight:      Height:        Intake/Output Summary (Last 24 hours) at 10/20/2024 1024 Last data filed at 10/20/2024 1018 Gross per 24 hour  Intake 3402.64 ml  Output 950 ml  Net 2452.64 ml   Filed Weights   10/18/24 1742  Weight: 106.6 kg    Examination:  General exam: NAD Respiratory system: Some decreased breath sounds in the bases otherwise clear.  No wheezing, no crackles, fair air movement.  Speaking in full sentences.  Cardiovascular system: Regular rate rhythm no murmurs rubs or gallops.  No JVD.  No pitting lower extremity edema.  Gastrointestinal system: Abdomen is soft, nontender, nondistended, positive bowel sounds.  No rebound.  No guarding.  Central nervous system: Alert and oriented. No focal neurological deficits. Extremities: Symmetric 5 x 5 power. Skin: No rashes, lesions or ulcers Psychiatry: Judgement and insight appear normal. Mood & affect appropriate.     Data Reviewed: I have personally reviewed following labs and imaging studies  CBC: Recent Labs  Lab 10/18/24 2006 10/19/24 0424 10/20/24 1002  WBC 15.2* 24.8* 15.3*  NEUTROABS 13.2*  --   --   HGB 12.1* 9.7* 11.0*  HCT 42.2 31.9* 36.6*  MCV 73.9* 73.8* 71.2*  PLT 395 306 264    Basic Metabolic Panel: Recent Labs  Lab 10/18/24 2006 10/19/24 0424  NA 142 139  K 4.0 4.1  CL 104 105  CO2 25 24  GLUCOSE 81 105*  BUN 13 15  CREATININE 1.40* 1.51*  CALCIUM  9.4 8.6*  MG 2.1  --     GFR: Estimated Creatinine Clearance: 49.2 mL/min (A) (by C-G formula based on SCr of 1.51 mg/dL (H)).  Liver Function Tests: Recent Labs  Lab 10/18/24 2006 10/19/24 0424   AST 28 21  ALT 13 8  ALKPHOS 98 75  BILITOT 0.5 0.5  PROT 8.6* 6.6  ALBUMIN  3.7 2.9*    CBG: No results for input(s): GLUCAP in the last 168 hours.   Recent Results (from the past 240 hours)  Resp panel by RT-PCR (RSV, Flu A&B, Covid) Anterior Nasal Swab     Status: None   Collection Time: 10/18/24 10:51 PM   Specimen: Anterior Nasal Swab  Result Value Ref Range Status   SARS Coronavirus 2  by RT PCR NEGATIVE NEGATIVE Final    Comment: (NOTE) SARS-CoV-2 target nucleic acids are NOT DETECTED.  The SARS-CoV-2 RNA is generally detectable in upper respiratory specimens during the acute phase of infection. The lowest concentration of SARS-CoV-2 viral copies this assay can detect is 138 copies/mL. A negative result does not preclude SARS-Cov-2 infection and should not be used as the sole basis for treatment or other patient management decisions. A negative result may occur with  improper specimen collection/handling, submission of specimen other than nasopharyngeal swab, presence of viral mutation(s) within the areas targeted by this assay, and inadequate number of viral copies(<138 copies/mL). A negative result must be combined with clinical observations, patient history, and epidemiological information. The expected result is Negative.  Fact Sheet for Patients:  bloggercourse.com  Fact Sheet for Healthcare Providers:  seriousbroker.it  This test is no t yet approved or cleared by the [place]  FDA and  has been authorized for detection and/or diagnosis of SARS-CoV-2 by FDA under an Emergency Use Authorization (EUA). This EUA will remain  in effect (meaning this test can be used) for the duration of the COVID-19 declaration under Section 564(b)(1) of the Act, 21 U.S.C.section 360bbb-3(b)(1), unless the authorization is terminated  or revoked sooner.       Influenza A by PCR NEGATIVE NEGATIVE Final   Influenza B by  PCR NEGATIVE NEGATIVE Final    Comment: (NOTE) The Xpert Xpress SARS-CoV-2/FLU/RSV plus assay is intended as an aid in the diagnosis of influenza from Nasopharyngeal swab specimens and should not be used as a sole basis for treatment. Nasal washings and aspirates are unacceptable for Xpert Xpress SARS-CoV-2/FLU/RSV testing.  Fact Sheet for Patients: bloggercourse.com  Fact Sheet for Healthcare Providers: seriousbroker.it  This test is not yet approved or cleared by the [place]  FDA and has been authorized for detection and/or diagnosis of SARS-CoV-2 by FDA under an Emergency Use Authorization (EUA). This EUA will remain in effect (meaning this test can be used) for the duration of the COVID-19 declaration under Section 564(b)(1) of the Act, 21 U.S.C. section 360bbb-3(b)(1), unless the authorization is terminated or revoked.     Resp Syncytial Virus by PCR NEGATIVE NEGATIVE Final    Comment: (NOTE) Fact Sheet for Patients: bloggercourse.com  Fact Sheet for Healthcare Providers: seriousbroker.it  This test is not yet approved or cleared by the [place]  FDA and has been authorized for detection and/or diagnosis of SARS-CoV-2 by FDA under an Emergency Use Authorization (EUA). This EUA will remain in effect (meaning this test can be used) for the duration of the COVID-19 declaration under Section 564(b)(1) of the Act, 21 U.S.C. section 360bbb-3(b)(1), unless the authorization is terminated or revoked.  Performed at [facility], [address], [place], [place] [address]   Culture, blood (Routine X 2) w Reflex to ID Panel     Status: None (Preliminary result)   Collection Time: 10/18/24 11:40 PM   Specimen: BLOOD  Result Value Ref Range Status   Specimen Description   Final    BLOOD RIGHT ANTECUBITAL Performed at [facility], [address], [place], [place] [address]    Special Requests   Final    BOTTLES DRAWN AEROBIC AND ANAEROBIC Blood Culture results may not be optimal due to an inadequate volume of blood received in culture bottles Performed at [facility], [address] [place], [place] [address]    Culture   Final    NO GROWTH 1  DAY Performed at [facility], [address], [place], [place] [address]    Report Status PENDING  Incomplete  Respiratory (~20 pathogens) panel by PCR     Status: None   Collection Time: 10/18/24 11:58 PM   Specimen: Nasopharyngeal Swab; Respiratory  Result Value Ref Range Status   Adenovirus NOT DETECTED NOT DETECTED Final   Coronavirus 229E NOT DETECTED NOT DETECTED Final    Comment: (NOTE) The Coronavirus on the Respiratory Panel, DOES NOT test for the novel  Coronavirus (2019 nCoV)    Coronavirus HKU1 NOT DETECTED NOT DETECTED Final   Coronavirus NL63 NOT DETECTED NOT DETECTED Final   Coronavirus OC43 NOT DETECTED NOT DETECTED Final   Metapneumovirus NOT DETECTED NOT DETECTED Final   Rhinovirus / Enterovirus NOT DETECTED NOT DETECTED Final   Influenza A NOT DETECTED NOT DETECTED Final   Influenza B NOT DETECTED NOT DETECTED Final   Parainfluenza Virus 1 NOT DETECTED NOT DETECTED Final   Parainfluenza Virus 2 NOT DETECTED NOT DETECTED Final   Parainfluenza Virus 3 NOT DETECTED NOT DETECTED Final   Parainfluenza Virus 4 NOT DETECTED NOT DETECTED Final   Respiratory Syncytial Virus NOT DETECTED NOT DETECTED Final   Bordetella pertussis NOT DETECTED NOT DETECTED Final   Bordetella Parapertussis NOT DETECTED NOT DETECTED Final   Chlamydophila pneumoniae NOT DETECTED NOT DETECTED Final   Mycoplasma pneumoniae NOT DETECTED NOT DETECTED Final    Comment: Performed at [facility], [address] [place], [place] [address]  Culture, blood (Routine X 2) w Reflex to ID Panel     Status: None (Preliminary result)   Collection Time: 10/19/24   1:43 AM   Specimen: BLOOD  Result Value Ref Range Status   Specimen Description   Final    BLOOD BLOOD RIGHT HAND Performed at [facility], [address], [place], [place] [address]    Special Requests   Final    BOTTLES DRAWN AEROBIC AND ANAEROBIC Blood Culture adequate volume Performed at [facility], [address] [place], [place] [address]    Culture   Final    NO GROWTH 1 DAY Performed at [facility], [address], [place], [place] [address]    Report Status PENDING  Incomplete         Radiology Studies: CT ABDOMEN PELVIS WO CONTRAST Result Date: 10/19/2024 EXAM: CT ABDOMEN AND PELVIS WITHOUT CONTRAST 10/19/2024 01:52:54 PM TECHNIQUE: CT of the abdomen and pelvis was performed without the administration of intravenous contrast. Multiplanar reformatted images are provided for review. Automated exposure control, iterative reconstruction, and/or weight-based adjustment of the mA/kV was utilized to reduce the radiation dose to as low as reasonably achievable. COMPARISON: CT abdomen and pelvis dated 10/18/2024. CLINICAL HISTORY: Diverticulitis, complication suspected; Follow-up with passing of the foreign body. F/u foreign body. FINDINGS: LOWER CHEST: Moderate patchy ground glass opacity and minimal patchy consolidation at both lung bases, slightly improved. LIVER: The liver is unremarkable. GALLBLADDER AND BILE DUCTS: Gallbladder is unremarkable. No biliary ductal dilatation. SPLEEN: No acute abnormality. PANCREAS: No acute abnormality. ADRENAL GLANDS: No acute abnormality. KIDNEYS, URETERS AND BLADDER: No stones in the kidneys or ureters. No hydronephrosis. No perinephric or periureteral stranding. Urinary bladder is unremarkable. GI AND BOWEL: Small hiatal hernia, unchanged. Otherwise normal nondistended stomach. No change in the position of the hyperdense curvilinear 6 cm apparent foreign body in the proximal sigmoid colon with apparent  perforation of the sigmoid colon wall superiorly and inferiorly with  associated stable wall thickening and surrounding fat stranding. No pericolonic free air or measurable fluid collection. Moderate underlying sigmoid diverticulosis. No dilated or thick walled small bowel loops. Normal appendix. PERITONEUM AND RETROPERITONEUM: No ascites. No free air. VASCULATURE: Aorta is normal in caliber. LYMPH NODES: No lymphadenopathy. REPRODUCTIVE ORGANS: No acute abnormality. BONES AND SOFT TISSUES: Moderate lumbar spondylosis. No acute osseous abnormality. No focal soft tissue abnormality. IMPRESSION: 1. Stable CT compared to yesterday. Unchanged position of hyperdense 6 cm curvilinear apparent foreign body in the proximal sigmoid colon with apparent mural perforations, associated wall thickening, and surrounding fat stranding; no pericolonic free air or measurable fluid collection. Electronically signed by: Selinda Blue MD 10/19/2024 02:36 PM EDT RP Workstation: [id number]   DG Abd Portable 1V Result Date: 10/19/2024 EXAM: 1 VIEW XRAY OF THE ABDOMEN 10/19/2024 04:42:00 AM COMPARISON: CT abdomen and pelvis yesterday. CLINICAL HISTORY: Foreign, possible chicken bone in sigmoid colon. FINDINGS: LINES, TUBES AND DEVICES: EKG leads noted. BOWEL: Nonobstructive bowel gas pattern. The left lower quadrant curvilinear foreign body appearing lesion located in the sigmoid colon is not apparent radiographically. SOFT TISSUES: No opaque urinary calculi. BONES: Degenerative changes of lumbar spine. No acute osseous abnormality. IMPRESSION: 1. CT suspected curvilinear foreign body in the sigmoid colon not radiographically visible. 2. Nonobstructive bowel gas pattern. Electronically signed by: Helayne Hurst MD 10/19/2024 04:53 AM EDT RP Workstation: [id number]   CT ABDOMEN PELVIS WO CONTRAST Result Date: 10/18/2024 EXAM: CT ABDOMEN AND PELVIS WITHOUT CONTRAST 10/18/2024 10:43:25 PM TECHNIQUE: CT of the abdomen and pelvis was performed  without the administration of intravenous contrast. Multiplanar reformatted images are provided for review. Automated exposure control, iterative reconstruction, and/or weight-based adjustment of the mA/kV was utilized to reduce the radiation dose to as low as reasonably achievable. COMPARISON: None available. CLINICAL HISTORY: Diverticulitis, complication suspected. FINDINGS: LOWER CHEST: Patchy bibasilar airspace infiltrates are present in keeping with multifocal infection in the acute setting. LIVER: The liver is unremarkable. GALLBLADDER AND BILE DUCTS: Gallbladder is unremarkable. No biliary ductal dilatation. A small hiatal hernia is present. SPLEEN: No acute abnormality. PANCREAS: No acute abnormality. ADRENAL GLANDS: No acute abnormality. KIDNEYS, URETERS AND BLADDER: No stones in the kidneys or ureters. No hydronephrosis. No perinephric or periureteral stranding. Urinary bladder is unremarkable. GI AND BOWEL: A wire-like metallic foreign body measuring roughly 6 cm in length has perforated the wall of the sigmoid colon at both ends with moderate surrounding inflammatory change identified. This is best seen on axial images 64/2 through 69/2. There is moderate background descending and sigmoid colonic diverticulosis. No free intraperitoneal gas or fluid. No pericolonic fluid collection identified. No evidence of obstruction. The stomach, small bowel, and large bowel are otherwise unremarkable. The appendix is normal. PERITONEUM AND RETROPERITONEUM: No ascites. No free air. VASCULATURE: Mild aortoiliac atherosclerotic calcification. No aortic aneurysm. LYMPH NODES: No lymphadenopathy. REPRODUCTIVE ORGANS: No acute abnormality. BONES AND SOFT TISSUES: Moderate right and severe left hip degenerative arthritis. Degenerative changes are seen within the visualized thoracolumbar spine. No acute bone abnormality. No lytic or blastic bone lesion. IMPRESSION: 1. Wire-like metallic intraluminal foreign body (~6 cm)  perforating the sigmoid colon at both ends with moderate surrounding inflammatory change. No free intraperitoneal gas or fluid, no pericolonic fluid collection, and no bowel obstruction. Surgical consultation recommended. 2. Moderate descending and sigmoid diverticulosis. 3. Patchy bibasilar airspace infiltrates consistent with acute multifocal infection. Electronically signed by: Dorethia Molt MD 10/18/2024 10:55 PM EDT RP Workstation: [id number]   CT HEAD WO CONTRAST Result Date: 10/18/2024 EXAM: CT  HEAD WITHOUT CONTRAST 10/18/2024 06:56:18 PM TECHNIQUE: CT of the head was performed without the administration of intravenous contrast. Automated exposure control, iterative reconstruction, and/or weight based adjustment of the mA/kV was utilized to reduce the radiation dose to as low as reasonably achievable. COMPARISON: 04/17/2017 CLINICAL HISTORY: Mental status change, unknown cause. Pt BIB ems after having a syncope episode and vomiting after waking up on his dinning table. Pt states feeling weak and not well today. VS stable. Hx of afib. FINDINGS: BRAIN AND VENTRICLES: No acute hemorrhage. No evidence of acute infarct. Subcortical and periventricular small vessel ischemic changes, particularly in the subcortical right frontal lobe. No hydrocephalus. No extra-axial collection. No mass effect or midline shift. ORBITS: No acute abnormality. SINUSES: No acute abnormality. SOFT TISSUES AND SKULL: No acute soft tissue abnormality. No skull fracture. IMPRESSION: 1. No acute intracranial abnormality. Electronically signed by: Pinkie Pebbles MD 10/18/2024 07:02 PM EDT RP Workstation: [id number]   DG Chest Port 1 View Result Date: 10/18/2024 EXAM: 1 VIEW XRAY OF THE CHEST 01/20/2019 COMPARISON: None available. CLINICAL HISTORY: LH?SABRA Syncope episode and vomiting after waking up on his dinning table. Hx of afib FINDINGS: LUNGS AND PLEURA: No focal pulmonary opacity. No pulmonary edema. No pleural effusion. No  pneumothorax. HEART AND MEDIASTINUM: No acute abnormality of the cardiac and mediastinal silhouettes. BONES AND SOFT TISSUES: No acute osseous abnormality. IMPRESSION: 1. No acute process. Electronically signed by: Greig Pique MD 10/18/2024 06:41 PM EDT RP Workstation: [id number]        Scheduled Meds:  atorvastatin   40 mg Oral Daily   budesonide  (PULMICORT ) nebulizer solution  0.5 mg Nebulization BID   digoxin   0.125 mg Oral Daily   ferrous sulfate   325 mg Oral Q breakfast   lamoTRIgine   100 mg Oral BID   metoprolol  succinate  25 mg Oral Daily   pantoprazole  (PROTONIX ) IV  40 mg Intravenous Q24H   polyethylene glycol-electrolytes  4,000 mL Oral Once   sodium chloride  flush  3 mL Intravenous Q12H   tamsulosin   0.4 mg Oral Daily   Continuous Infusions:  lactated ringers      piperacillin -tazobactam (ZOSYN )  IV Stopped (10/20/24 1014)   vancomycin  Stopped (10/19/24 2329)     LOS: 2 days    Time spent: 40 minutes    Toribio Hummer, MD [facility]   To contact the attending provider between 7A-7P or the covering provider during after hours 7P-7A, please log into the web site [email or web address] and access using universal [place] password for that web site. If you do not have the password, please call the hospital operator.  10/20/2024, 10:24 AM

## 2024-10-20 NOTE — Progress Notes (Signed)
 Progress Note: General Surgery Service   Chief Complaint/Subjective: No abdominal pain.  Hungry  Objective: Vital signs in last 24 hours: Temp:  [97.9 F (36.6 C)-98.2 F (36.8 C)] 97.9 F (36.6 C) (10/27 0111) Pulse Rate:  [52-64] 54 (10/27 0822) Resp:  [16] 16 (10/26 1403) BP: (113-150)/(63-73) 113/63 (10/27 0111) SpO2:  [99 %-100 %] 100 % (10/27 0111) Last BM Date : (P) 10/18/24  Intake/Output from previous day: 10/26 0701 - 10/27 0700 In: 150 [P.O.:150] Out: 1350 [Urine:1350] Intake/Output this shift: Total I/O In: 3334.3 [I.V.:2955.8; IV Piggyback:378.5] Out: 200 [Urine:200]  GI: Abd soft, nontender   Lab Results: CBC  Recent Labs    10/18/24 2006 10/19/24 0424  WBC 15.2* 24.8*  HGB 12.1* 9.7*  HCT 42.2 31.9*  PLT 395 306   BMET Recent Labs    10/18/24 2006 10/19/24 0424  NA 142 139  K 4.0 4.1  CL 104 105  CO2 25 24  GLUCOSE 81 105*  BUN 13 15  CREATININE 1.40* 1.51*  CALCIUM  9.4 8.6*   PT/INR No results for input(s): LABPROT, INR in the last 72 hours. ABG No results for input(s): PHART, HCO3 in the last 72 hours.  Invalid input(s): PCO2, PO2  Anti-infectives: Anti-infectives (From admission, onward)    Start     Dose/Rate Route Frequency Ordered Stop   10/19/24 2330  vancomycin  (VANCOCIN ) IVPB 1000 mg/200 mL premix        1,000 mg 200 mL/hr over 60 Minutes Intravenous Every 24 hours 10/18/24 2321     10/19/24 0600  piperacillin -tazobactam (ZOSYN ) IVPB 3.375 g        3.375 g 12.5 mL/hr over 240 Minutes Intravenous Every 8 hours 10/18/24 2319 10/24/24 0559   10/18/24 2345  piperacillin -tazobactam (ZOSYN ) IVPB 3.375 g        3.375 g 100 mL/hr over 30 Minutes Intravenous  Once 10/18/24 2333 10/19/24 0016   10/18/24 2330  azithromycin (ZITHROMAX) 500 mg in sodium chloride  0.9 % 250 mL IVPB  Status:  Discontinued        500 mg 250 mL/hr over 60 Minutes Intravenous  Once 10/18/24 2317 10/18/24 2324   10/18/24 2330  vancomycin   (VANCOREADY) IVPB 2000 mg/400 mL        2,000 mg 200 mL/hr over 120 Minutes Intravenous  Once 10/18/24 2319 10/19/24 0931   10/18/24 2315  cefTRIAXone  (ROCEPHIN ) 1 g in sodium chloride  0.9 % 100 mL IVPB  Status:  Discontinued        1 g 200 mL/hr over 30 Minutes Intravenous  Once 10/18/24 2300 10/18/24 2303   10/18/24 2315  azithromycin (ZITHROMAX) 500 mg in sodium chloride  0.9 % 250 mL IVPB  Status:  Discontinued        500 mg 250 mL/hr over 60 Minutes Intravenous  Once 10/18/24 2300 10/18/24 2303   10/18/24 2315  piperacillin -tazobactam (ZOSYN ) IVPB 3.375 g  Status:  Discontinued        3.375 g 100 mL/hr over 30 Minutes Intravenous  Once 10/18/24 2306 10/18/24 2332       Medications: Scheduled Meds:  atorvastatin   40 mg Oral Daily   budesonide  (PULMICORT ) nebulizer solution  0.5 mg Nebulization BID   digoxin   0.125 mg Oral Daily   ferrous sulfate   325 mg Oral Q breakfast   lamoTRIgine   100 mg Oral BID   metoprolol  succinate  25 mg Oral Daily   pantoprazole  (PROTONIX ) IV  40 mg Intravenous Q24H   polyethylene glycol-electrolytes  4,000 mL Oral  Once   sodium chloride  flush  3 mL Intravenous Q12H   tamsulosin   0.4 mg Oral Daily   Continuous Infusions:  lactated ringers      lactated ringers  Stopped (10/20/24 9176)   piperacillin -tazobactam (ZOSYN )  IV 12.5 mL/hr at 10/20/24 9167   vancomycin  Stopped (10/19/24 2329)   PRN Meds:.acetaminophen  **OR** acetaminophen , alum & mag hydroxide-simeth, HYDROmorphone  (DILAUDID ) injection, ipratropium-albuterol , lactated ringers , magic mouthwash, meclizine , menthol, methocarbamol  (ROBAXIN ) injection, naphazoline-glycerin, ondansetron  (ZOFRAN ) IV, oxyCODONE , phenol, prochlorperazine, simethicone, sodium chloride   Assessment/Plan: [name] [name] is a 73 year old male with foreign body, possibly chicken bone, lodged in his sigmoid colon.  Discussed with Dr. Rosalie and Dr. Charlanne with gastroenterology Plan to give clear liquids and golytely  bowel prep  today, repeat CT in the AM, and if it has not moved then consider flexible sigmoidoscopy for retrieval.  He is at risk of progressing to frank perforation at any point, which would lead to surgery for resection of this segment of colon.     LOS: 2 days   FEN: CLD, NPO at Midnight ID: [email or web address]  per medicine VTE: SCDs, lovenox  Foley: None Dispo: Continued inpatient care    [name] JINNY Foy, MD  [facility] Use [email or web address] to contact on call provider  Daily Billing: [id number] - High MDM

## 2024-10-20 NOTE — Evaluation (Addendum)
 Clinical/Bedside Swallow Evaluation Patient Details  Name: [name] [name] MRN: [id number] Date of Birth: October 08, 1951  Today's Date: 10/20/2024 Time: SLP Start Time (ACUTE ONLY): 1148 SLP Stop Time (ACUTE ONLY): 1205 SLP Time Calculation (min) (ACUTE ONLY): 17 min  Past Medical History:  Past Medical History:  Diagnosis Date   Anemia    Asthma    Atrial fibrillation (HCC)    CHF (congestive heart failure) (HCC)    COPD (chronic obstructive pulmonary disease) (HCC)    Heart disease    Hypertension    Syncope    Past Surgical History:  Past Surgical History:  Procedure Laterality Date   CARDIAC CATHETERIZATION N/A 12/08/2015   Procedure: Left Heart Cath and Coronary Angiography;  Surgeon: Rober Chroman, MD;  Location: [facility];  Service: Cardiovascular;  Laterality: N/A;   ESOPHAGOSCOPY  2002   Rigid esophagoscopy and removal of esophageal foreign body.   FOREIGN BODY REMOVAL ESOPHAGEAL  2002   removal of swallowed chicken bone   SHOULDER CLOSED REDUCTION Left 12/01/2017   Procedure: CLOSED REDUCTION SHOULDER;  Surgeon: Celena Sharper, MD;  Location: [facility] OR;  Service: Orthopedics;  Laterality: Left;   HPI:  Patient is a 73 y.o. male with medical history significant of paroxysmal atrial fibrillation, atrial flutter, chronic diastolic heart failure, CAD with cardiac stent placement, sick sinus syndrome, COPD, GERD, seizure, BPH, hypogonadism, gout, chronic tinnitus and vertigo, and non-insulin-dependent DM type II presented to ED on 10/25 complaining of episode of nausea, vomiting abdominal discomfort, and presyncope episode. CT abdomen pelvis revealed wire-like metallic intraluminal foreign body perforating the sigmoid colon at both ends with moderate surrounding inflammatory change. Bedside swallow evaluation ordered to assess patient's swallow.    Assessment / Plan / Recommendation  Clinical Impression  Patient is not currently presenting with clinical s/s of dysphagia as  per this BSE. He was awake, alert, and participated fully in this evaluation. Patient is on a clear liquid diet. SLP assessed patient's swallow via 3 oz. Water test. Swallow initiation was timely and no overt s/s of aspiration observed. RN communicated that she had no concerns regarding patient's swallow. Patient had no questions, comments, or concerns regarding his swallow. SLP is recommending to continue clear liquid diet. Advance as tolerated per surgery recommendations. SLP Visit Diagnosis: Dysphagia, unspecified (R13.10)    Aspiration Risk       Diet Recommendation Other (Comment) (SLP is recommending to continue clear liquid diet. Advance as tolerated per surgery recommendations.)    Liquid Administration via: Spoon;Cup;Straw Medication Administration: Other (Comment) (as tolerated.) Supervision: Patient able to self feed Compensations: Slow rate;Small sips/bites Postural Changes: Seated upright at 90 degrees;Remain upright for at least 30 minutes after po intake    Other  Recommendations Oral Care Recommendations: Oral care BID     Assistance Recommended at Discharge    Functional Status Assessment Patient has had a recent decline in their functional status and demonstrates the ability to make significant improvements in function in a reasonable and predictable amount of time.  Frequency and Duration            Prognosis        Swallow Study   General Date of Onset: 10/20/24 HPI: Patient is a 73 y.o. male with medical history significant of paroxysmal atrial fibrillation, atrial flutter, chronic diastolic heart failure, CAD with cardiac stent placement, sick sinus syndrome, COPD, GERD, seizure, BPH, hypogonadism, gout, chronic tinnitus and vertigo, and non-insulin-dependent DM type II presented to ED on 10/25 complaining of  episode of nausea, vomiting abdominal discomfort, and presyncope episode. CT abdomen pelvis revealed wire-like metallic intraluminal foreign body perforating  the sigmoid colon at both ends with moderate surrounding inflammatory change. Bedside swallow evaluation ordered to assess patient's swallow. Type of Study: Bedside Swallow Evaluation Diet Prior to this Study: Clear liquid diet Temperature Spikes Noted: No Respiratory Status: Room air History of Recent Intubation: No Behavior/Cognition: Alert;Cooperative;Pleasant mood Oral Cavity Assessment: Within Functional Limits Oral Care Completed by SLP: No Vision: Functional for self-feeding Self-Feeding Abilities: Able to feed self Patient Positioning: Upright in chair    Oral/Motor/Sensory Function Overall Oral Motor/Sensory Function: Within functional limits   Ice Chips     Thin Liquid Thin Liquid: Within functional limits Presentation: Self Fed;Cup    Nectar Thick     Honey Thick     Puree     Solid           Damien Hy  Graduate SLP Clinican

## 2024-10-20 NOTE — Progress Notes (Signed)
 Mobility Specialist - Progress Note   10/20/24 1114  Mobility  Activity Ambulated with assistance  Level of Assistance Contact guard assist, steadying assist  Assistive Device Front wheel walker  Distance Ambulated (ft) 180 ft  Range of Motion/Exercises Active  Activity Response Tolerated well  Mobility Referral Yes  Mobility visit 1 Mobility  Mobility Specialist Start Time (ACUTE ONLY) 1100  Mobility Specialist Stop Time (ACUTE ONLY) 1114  Mobility Specialist Time Calculation (min) (ACUTE ONLY) 14 min   Pt was found on recliner chair and agreeable to mobilize. Stated feeling weak. At EOS returned to recliner chair with all needs met. Call bell in reach.   Erminio Leos,  Mobility Specialist Can be reached via Secure Chat

## 2024-10-20 NOTE — Plan of Care (Signed)
  Problem: Education: Goal: Knowledge of General Education information will improve Description: Including pain rating scale, medication(s)/side effects and non-pharmacologic comfort measures Outcome: Progressing   Problem: Health Behavior/Discharge Planning: Goal: Ability to manage health-related needs will improve Outcome: Progressing   Problem: Clinical Measurements: Goal: Ability to maintain clinical measurements within normal limits will improve Outcome: Progressing Goal: Will remain free from infection Outcome: Progressing Goal: Diagnostic test results will improve Outcome: Progressing Goal: Respiratory complications will improve Outcome: Progressing Goal: Cardiovascular complication will be avoided Outcome: Progressing   Problem: Activity: Goal: Risk for activity intolerance will decrease Outcome: Progressing   Problem: Nutrition: Goal: Adequate nutrition will be maintained Outcome: Progressing   Problem: Coping: Goal: Level of anxiety will decrease Outcome: Progressing   Problem: Elimination: Goal: Will not experience complications related to urinary retention Outcome: Progressing   Problem: Pain Managment: Goal: General experience of comfort will improve and/or be controlled Outcome: Progressing   Problem: Safety: Goal: Ability to remain free from injury will improve Outcome: Progressing   Problem: Skin Integrity: Goal: Risk for impaired skin integrity will decrease Outcome: Progressing   Problem: Activity: Goal: Ability to tolerate increased activity will improve Outcome: Progressing   Problem: Clinical Measurements: Goal: Ability to maintain a body temperature in the normal range will improve Outcome: Progressing   Problem: Respiratory: Goal: Ability to maintain adequate ventilation will improve Outcome: Progressing Goal: Ability to maintain a clear airway will improve Outcome: Progressing   Problem: Elimination: Goal: Will not experience  complications related to bowel motility Outcome: Not Progressing

## 2024-10-21 ENCOUNTER — Inpatient Hospital Stay (HOSPITAL_COMMUNITY)

## 2024-10-21 DIAGNOSIS — T184XXA Foreign body in colon, initial encounter: Secondary | ICD-10-CM

## 2024-10-21 DIAGNOSIS — K5792 Diverticulitis of intestine, part unspecified, without perforation or abscess without bleeding: Secondary | ICD-10-CM | POA: Diagnosis not present

## 2024-10-21 DIAGNOSIS — T189XXD Foreign body of alimentary tract, part unspecified, subsequent encounter: Secondary | ICD-10-CM | POA: Diagnosis not present

## 2024-10-21 DIAGNOSIS — J438 Other emphysema: Secondary | ICD-10-CM | POA: Diagnosis not present

## 2024-10-21 DIAGNOSIS — I5032 Chronic diastolic (congestive) heart failure: Secondary | ICD-10-CM | POA: Diagnosis not present

## 2024-10-21 LAB — CBC
HCT: 31.6 % — ABNORMAL LOW (ref 39.0–52.0)
Hemoglobin: 9.5 g/dL — ABNORMAL LOW (ref 13.0–17.0)
MCH: 22.4 pg — ABNORMAL LOW (ref 26.0–34.0)
MCHC: 30.1 g/dL (ref 30.0–36.0)
MCV: 74.4 fL — ABNORMAL LOW (ref 80.0–100.0)
Platelets: 280 K/uL (ref 150–400)
RBC: 4.25 MIL/uL (ref 4.22–5.81)
RDW: 16.3 % — ABNORMAL HIGH (ref 11.5–15.5)
WBC: 9.8 K/uL (ref 4.0–10.5)
nRBC: 0 % (ref 0.0–0.2)

## 2024-10-21 LAB — BASIC METABOLIC PANEL WITH GFR
Anion gap: 9 (ref 5–15)
BUN: 11 mg/dL (ref 8–23)
CO2: 24 mmol/L (ref 22–32)
Calcium: 8.9 mg/dL (ref 8.9–10.3)
Chloride: 104 mmol/L (ref 98–111)
Creatinine, Ser: 1.62 mg/dL — ABNORMAL HIGH (ref 0.61–1.24)
GFR, Estimated: 44 mL/min — ABNORMAL LOW (ref >60)
Glucose, Bld: 83 mg/dL (ref 70–99)
Potassium: 3.8 mmol/L (ref 3.5–5.1)
Sodium: 137 mmol/L (ref 135–145)

## 2024-10-21 LAB — LEGIONELLA PNEUMOPHILA SEROGP 1 UR AG: L. pneumophila Serogp 1 Ur Ag: NEGATIVE

## 2024-10-21 MED ORDER — NA SULFATE-K SULFATE-MG SULF 17.5-3.13-1.6 GM/177ML PO SOLN: 0.5000 | Freq: Once | ORAL | Status: DC

## 2024-10-21 MED ORDER — SODIUM CHLORIDE 0.9 % IV SOLN: INTRAVENOUS | Status: DC

## 2024-10-21 NOTE — Anesthesia Preprocedure Evaluation (Signed)
 Anesthesia Evaluation  Patient identified by MRN, date of birth, ID band Patient awake    Reviewed: Allergy & Precautions, NPO status , Patient's Chart, lab work & pertinent test results, reviewed documented beta blocker date and time   Airway Mallampati: II  TM Distance: >3 FB Neck ROM: Full    Dental  (+) Dental Advisory Given   Pulmonary asthma , sleep apnea , pneumonia, unresolved, COPD,  COPD inhaler Multifocal pna on CT- on vanco/zosyn  (sepsis 2/2 foreign body perforating colon vs pna)  Passed swallow test     breath sounds clear to auscultation       Cardiovascular hypertension, Pt. on medications and Pt. on home beta blockers + CAD (cath 2016 nonobstructive CAD)  + dysrhythmias (hx Afib and sick sinus, currently NSR) Atrial Fibrillation  Rhythm:Regular Rate:Normal  Echo 2018 Left ventricle: The cavity size was normal. Systolic function was    normal. The estimated ejection fraction was in the range of 55%    to 60%. Wall motion was normal; there were no regional wall    motion abnormalities. Left ventricular diastolic function    parameters were normal.     Neuro/Psych Seizures - (lamictal ),   negative psych ROS   GI/Hepatic Neg liver ROS,GERD  Medicated and Controlled,,CT abdomen pelvis revealed wire-like metallic intraluminal foreign body perforating the sigmoid colon at both ends with moderate surrounding inflammatory change   Endo/Other  diabetes, Well Controlled, Type 2, Oral Hypoglycemic Agents  A1c 5.3 09/2024 Obesity BMI 37  Renal/GU CRFRenal disease (cr 1.62)  negative genitourinary   Musculoskeletal negative musculoskeletal ROS (+)    Abdominal   Peds  Hematology  (+) Blood dyscrasia, anemia   Anesthesia Other Findings   Reproductive/Obstetrics negative OB ROS                              Anesthesia Physical Anesthesia Plan  ASA: 3  Anesthesia Plan: MAC   Post-op  Pain Management:    Induction:   PONV Risk Score and Plan: 2 and Propofol  infusion and TIVA  Airway Management Planned: Natural Airway and Simple Face Mask  Additional Equipment: None  Intra-op Plan:   Post-operative Plan:   Informed Consent: I have reviewed the patients History and Physical, chart, labs and discussed the procedure including the risks, benefits and alternatives for the proposed anesthesia with the patient or authorized representative who has indicated his/her understanding and acceptance.       Plan Discussed with: CRNA  Anesthesia Plan Comments:          Anesthesia Quick Evaluation

## 2024-10-21 NOTE — Consult Note (Addendum)
 Consultation  Referring Provider:  TRH  Primary Care Physician:  [name], MD Primary Gastroenterologist:  Dr. Legrand       Reason for Consultation:    foreign object in colon   LOS: 3 days          HPI:   [name] [name] is a 73 y.o. male with past medical history significant for paroxysmal A-fib, a flutter, chronic diastolic heart failure, CAD status post stent placement, SSS, COPD, GERD, seizure, BPH, hypogonadism, gout, chronic tinnitus and vertigo, NIDDM type II, presents for evaluation of foreign object in colon  Patient states Saturday he was cooking dinner which involved fried chicken, rice, beef, and halfway through his meal he began feeling altered and was unsure if he passed out or was having vertigo.  He denied nausea, vomiting, abdominal pain.  He does have a history of epilepsy so he was unsure if he had a seizure.  Upon arrival to ED he was found to be febrile with temperature 102 F, tachypneic, leukocytosis with WBC 15.2  CT abdomen pelvis without contrast showed wire like metallic foreign body (6 cm) perforating the sigmoid colon at both ends with moderate surrounding inflammation.  No pericolonic fluid collection, no intraperitoneal gas or fluid, no bowel obstruction.  And was also found to have pneumonia  Patient was evaluated by surgery and since patient was not found to be perforated and there was no abdominal pain surgery was held off and GI was consulted  Patient completed half of the GoLytely  prep and repeat CT abdomen pelvis without contrast showed foreign body in proximal sigmoid colon with transmural perforation unchanged in position.  It is suspected to be a chicken bone.  Last colonoscopy reportedly in 2009.  Patient was scheduled to do Cologuard but this has not been completed  Past Medical History:  Diagnosis Date   Anemia    Asthma    Atrial fibrillation (HCC)    CHF (congestive heart failure) (HCC)    COPD (chronic obstructive pulmonary  disease) (HCC)    Heart disease    Hypertension    Syncope     Surgical History:  He  has a past surgical history that includes Cardiac catheterization (N/A, 12/08/2015); Shoulder Closed Reduction (Left, 12/01/2017); Esophagoscopy (2002); and Foreign body removal esophageal (2002). Family History:  His family history includes Cancer in his mother. Social History:   reports that he has never smoked. He has never used smokeless tobacco. He reports that he does not drink alcohol and does not use drugs.  Prior to Admission medications   Medication Sig Start Date End Date Taking? Authorizing Provider  acetaminophen  (TYLENOL ) 325 MG tablet Take 2 tablets (650 mg total) by mouth every 6 (six) hours as needed for mild pain (or Fever >/= 101). 12/03/17  Yes Rai, Ripudeep K, MD  albuterol  (PROVENTIL  HFA;VENTOLIN  HFA) 108 (90 Base) MCG/ACT inhaler Inhale 2 puffs into the lungs every 6 (six) hours as needed. 11/19/17  Yes Newlin, Enobong, MD  albuterol  (PROVENTIL ) (2.5 MG/3ML) 0.083% nebulizer solution Take 3 mLs (2.5 mg total) by nebulization every 6 (six) hours as needed for wheezing or shortness of breath. 08/17/17  Yes Newlin, Enobong, MD  allopurinol  (ZYLOPRIM ) 300 MG tablet TAKE 1 TABLET BY MOUTH EVERY DAY 09/28/24  Yes Newlin, Enobong, MD  ARNUITY ELLIPTA  100 MCG/ACT AEPB Inhale 1 puff into the lungs daily. Patient taking differently: Inhale 1 puff into the lungs daily as needed. 11/20/18  Yes Ramaswamy, Dorethia,  MD  aspirin  EC 81 MG tablet Take 81 mg by mouth daily as needed.   Yes [provider]  atorvastatin  (LIPITOR) 40 MG tablet Take 1 tablet (40 mg total) by mouth daily. 07/10/24  Yes Newlin, Enobong, MD  digoxin  (LANOXIN ) 0.125 MG tablet Take 1 tablet (0.125 mg total) by mouth daily. 12/03/17  Yes Rai, Ripudeep K, MD  empagliflozin  (JARDIANCE ) 10 MG TABS tablet Take 1 tablet (10 mg total) by mouth daily before breakfast. 07/10/24  Yes Newlin, Enobong, MD  ferrous sulfate  325 (65 FE)  MG tablet Take 325 mg by mouth daily with breakfast. Reported on 03/16/2016   Yes [provider]  fluticasone  (FLONASE ) 50 MCG/ACT nasal spray Place 2 sprays into both nostrils daily. 07/10/24  Yes Newlin, Enobong, MD  furosemide  (LASIX ) 20 MG tablet TAKE 1 TABLET BY MOUTH EVERY DAY Patient taking differently: Take 20 mg by mouth daily as needed. 02/21/24  Yes Newlin, Enobong, MD  lamoTRIgine  (LAMICTAL ) 100 MG tablet TAKE 1 TABLET BY MOUTH TWICE A DAY 01/04/24  Yes Onita Duos, MD  metoprolol  succinate (TOPROL -XL) 25 MG 24 hr tablet TAKE 1 TABLET (25 MG TOTAL) BY MOUTH DAILY. 07/17/24  Yes Newlin, Enobong, MD  nitroGLYCERIN  (NITROSTAT ) 0.4 MG SL tablet Place 1 tablet (0.4 mg total) under the tongue every 5 (five) minutes as needed for chest pain. 01/25/14  Yes Claudene Pacific, MD  pantoprazole  (PROTONIX ) 40 MG tablet Take 1 tablet (40 mg total) by mouth daily. 07/10/24  Yes Newlin, Enobong, MD  potassium chloride  (K-DUR) 10 MEQ tablet Take 10 mEq by mouth daily.   Yes [provider]  tacrolimus (PROTOPIC) 0.1 % ointment Apply 1 Application topically 2 (two) times daily. 07/19/21  Yes [provider]  tamsulosin  (FLOMAX ) 0.4 MG CAPS capsule Take 1 capsule (0.4 mg total) by mouth daily. 07/10/24  Yes Newlin, Enobong, MD  amiodarone  (PACERONE ) 200 MG tablet Take 1 tablet (200 mg total) by mouth 2 (two) times daily. Patient not taking: Reported on 10/19/2024 12/03/17   Rai, Nydia POUR, MD  clotrimazole -betamethasone  (LOTRISONE ) cream Apply 1 application topically 2 (two) times daily as needed (FOR RASH). Patient not taking: Reported on 10/19/2024 12/03/17   Rai, Nydia POUR, MD  diclofenac  Sodium (VOLTAREN ) 1 % GEL Apply 4 g topically 4 (four) times daily. Patient not taking: Reported on 10/19/2024 07/11/21   Newlin, Enobong, MD  levocetirizine (XYZAL ) 5 MG tablet Take 1 tablet (5 mg total) by mouth every evening. Patient not taking: Reported on 10/19/2024 03/13/24 03/08/25  Sebastian Beverley NOVAK, MD    Current Facility-Administered Medications  Medication Dose Route Frequency Provider Last Rate Last Admin   0.9 %  sodium chloride  infusion   Intravenous Continuous McMichael, Bayley M, PA-C       acetaminophen  (TYLENOL ) tablet 650 mg  650 mg Oral Q6H PRN Sundil, Subrina, MD       Or   acetaminophen  (TYLENOL ) suppository 650 mg  650 mg Rectal Q6H PRN Sundil, Subrina, MD       alum & mag hydroxide-simeth (MAALOX/MYLANTA) 200-200-20 MG/5ML suspension 30 mL  30 mL Oral Q6H PRN Sundil, Subrina, MD       atorvastatin  (LIPITOR) tablet 40 mg  40 mg Oral Daily Sundil, Subrina, MD   40 mg at 10/21/24 0910   budesonide  (PULMICORT ) nebulizer solution 0.5 mg  0.5 mg Nebulization BID Sebastian Toribio GAILS, MD   0.5 mg at 10/21/24 9161   Chlorhexidine  Gluconate Cloth 2 % PADS 6 each  6  each Topical Daily Sebastian Toribio GAILS, MD   6 each at 10/20/24 2210   digoxin  (LANOXIN ) tablet 0.125 mg  0.125 mg Oral Daily Sundil, Subrina, MD   0.125 mg at 10/21/24 9089   ferrous sulfate  tablet 325 mg  325 mg Oral Q breakfast Sundil, Subrina, MD   325 mg at 10/21/24 0911   HYDROmorphone  (DILAUDID ) injection 0.5-1 mg  0.5-1 mg Intravenous Q2H PRN Sheldon Standing, MD       ipratropium-albuterol  (DUONEB) 0.5-2.5 (3) MG/3ML nebulizer solution 3 mL  3 mL Nebulization Q6H PRN Lee, Subrina, MD   3 mL at 10/21/24 9161   lactated ringers  infusion   Intravenous Continuous Sebastian Toribio GAILS, MD 100 mL/hr at 10/21/24 0749 New Bag at 10/21/24 0749   lamoTRIgine  (LAMICTAL ) tablet 100 mg  100 mg Oral BID Sundil, Subrina, MD   100 mg at 10/21/24 9087   magic mouthwash  15 mL Oral QID PRN Sundil, Subrina, MD       meclizine  (ANTIVERT ) tablet 12.5 mg  12.5 mg Oral TID PRN Sundil, Subrina, MD   12.5 mg at 10/19/24 0820   menthol (CEPACOL) lozenge 3 mg  1 lozenge Oral PRN Sundil, Subrina, MD       methocarbamol  (ROBAXIN ) injection 1,000 mg  1,000 mg Intravenous Q6H PRN Sundil, Subrina, MD       metoprolol  succinate (TOPROL -XL) 24 hr  tablet 25 mg  25 mg Oral Daily Sundil, Subrina, MD   25 mg at 10/21/24 9089   mupirocin  ointment (BACTROBAN ) 2 % 1 Application  1 Application Nasal BID Sebastian Toribio GAILS, MD   1 Application at 10/21/24 0915   naphazoline-glycerin (CLEAR EYES REDNESS) ophth solution 1-2 drop  1-2 drop Both Eyes QID PRN Sundil, Subrina, MD       ondansetron  (ZOFRAN ) injection 4 mg  4 mg Intravenous Q6H PRN Sundil, Subrina, MD       oxyCODONE  (Oxy IR/ROXICODONE ) immediate release tablet 5-10 mg  5-10 mg Oral Q4H PRN Sundil, Subrina, MD       pantoprazole  (PROTONIX ) injection 40 mg  40 mg Intravenous Q24H Sundil, Subrina, MD   40 mg at 10/21/24 0007   phenol (CHLORASEPTIC) mouth spray 2 spray  2 spray Mouth/Throat PRN Sundil, Subrina, MD       piperacillin -tazobactam (ZOSYN ) IVPB 3.375 g  3.375 g Intravenous Q8H Sundil, Subrina, MD 12.5 mL/hr at 10/21/24 0611 3.375 g at 10/21/24 9388   prochlorperazine (COMPAZINE) injection 5-10 mg  5-10 mg Intravenous Q4H PRN Sundil, Subrina, MD       simethicone (MYLICON) 40 MG/0.6ML suspension 80 mg  80 mg Oral QID PRN Sundil, Subrina, MD       sodium chloride  (OCEAN) 0.65 % nasal spray 1-2 spray  1-2 spray Each Nare Q6H PRN Sundil, Subrina, MD       sodium chloride  flush (NS) 0.9 % injection 3 mL  3 mL Intravenous Q12H Sundil, Subrina, MD   3 mL at 10/21/24 0915   tamsulosin  (FLOMAX ) capsule 0.4 mg  0.4 mg Oral Daily Sundil, Subrina, MD   0.4 mg at 10/21/24 0911   vancomycin  (VANCOCIN ) IVPB 1000 mg/200 mL premix  1,000 mg Intravenous Q24H Poindexter, Leann T, RPH 200 mL/hr at 10/21/24 0010 1,000 mg at 10/21/24 0010    Allergies as of 10/18/2024   (No Known Allergies)    Review of Systems  Constitutional:  Positive for fever. Negative for chills and weight loss.  HENT:  Negative for hearing loss and tinnitus.   Eyes:  Negative for blurred vision and double vision.  Respiratory:  Negative for cough and hemoptysis.   Cardiovascular:  Negative for chest pain and palpitations.   Gastrointestinal:  Negative for abdominal pain, blood in stool, constipation, diarrhea, heartburn, melena, nausea and vomiting.  Genitourinary:  Negative for dysuria and urgency.  Musculoskeletal:  Negative for myalgias and neck pain.  Skin:  Negative for itching and rash.  Neurological:  Negative for seizures and loss of consciousness.  Psychiatric/Behavioral:  Negative for depression and suicidal ideas.        Physical Exam:  Vital signs in last 24 hours: Temp:  [97.6 F (36.4 C)-98.1 F (36.7 C)] 98.1 F (36.7 C) (10/28 0517) Pulse Rate:  [54-56] 56 (10/28 0910) Resp:  [16-20] 16 (10/27 2018) BP: (103-137)/(65-97) 122/65 (10/28 0910) SpO2:  [97 %-100 %] 99 % (10/28 0843) Last BM Date : 10/18/24 Last BM recorded by nurses in past 5 days No data recorded  Physical Exam Constitutional:      Appearance: Normal appearance. He is normal weight.  HENT:     Head: Normocephalic and atraumatic.     Nose: Nose normal. No congestion.     Mouth/Throat:     Mouth: Mucous membranes are moist.     Pharynx: Oropharynx is clear.  Eyes:     General: No scleral icterus.    Extraocular Movements: Extraocular movements intact.  Cardiovascular:     Rate and Rhythm: Normal rate and regular rhythm.  Pulmonary:     Effort: Pulmonary effort is normal. No respiratory distress.  Abdominal:     General: Bowel sounds are normal. There is no distension.     Palpations: Abdomen is soft. There is no mass.     Tenderness: There is no abdominal tenderness. There is no guarding or rebound.     Hernia: No hernia is present.  Musculoskeletal:        General: No swelling. Normal range of motion.     Cervical back: Normal range of motion and neck supple.  Skin:    General: Skin is warm and dry.  Neurological:     General: No focal deficit present.     Mental Status: He is oriented to person, place, and time.  Psychiatric:        Mood and Affect: Mood normal.        Behavior: Behavior normal.         Thought Content: Thought content normal.        Judgment: Judgment normal.      LAB RESULTS: Recent Labs    10/19/24 0424 10/20/24 1002 10/21/24 0741  WBC 24.8* 15.3* 9.8  HGB 9.7* 11.0* 9.5*  HCT 31.9* 36.6* 31.6*  PLT 306 264 280   BMET Recent Labs    10/19/24 0424 10/20/24 1002 10/21/24 0741  NA 139 136 137  K 4.1 4.6 3.8  CL 105 104 104  CO2 24 20* 24  GLUCOSE 105* 89 83  BUN 15 15 11   CREATININE 1.51* 1.48* 1.62*  CALCIUM  8.6* 8.9 8.9   LFT Recent Labs    10/19/24 0424  PROT 6.6  ALBUMIN  2.9*  AST 21  ALT 8  ALKPHOS 75  BILITOT 0.5   PT/INR No results for input(s): LABPROT, INR in the last 72 hours.  STUDIES: CT ABDOMEN PELVIS WO CONTRAST Result Date: 10/21/2024 EXAM: CT ABDOMEN AND PELVIS WITHOUT CONTRAST 10/21/2024 05:58:39 AM TECHNIQUE: CT of the abdomen and pelvis was performed without the administration of intravenous contrast. Multiplanar reformatted  images are provided for review. Automated exposure control, iterative reconstruction, and/or weight-based adjustment of the mA/kV was utilized to reduce the radiation dose to as low as reasonably achievable. COMPARISON: 10/19/2024 CLINICAL HISTORY: Foreign body, colon; chicken bone with colonic perforation. FINDINGS: LOWER CHEST: Patchy ground glass opacity and minimal consolidation in both lung bases, without significant change. Stable small hiatal hernia. LIVER: The liver is unremarkable. GALLBLADDER AND BILE DUCTS: Gallbladder is unremarkable. No biliary ductal dilatation. SPLEEN: No acute abnormality. PANCREAS: No acute abnormality. ADRENAL GLANDS: No acute abnormality. KIDNEYS, URETERS AND BLADDER: No stones in the kidneys or ureters. No hydronephrosis. No perinephric or periureteral stranding. Urinary bladder is unremarkable. GI AND BOWEL: Stomach demonstrates no acute abnormality. A thin curvilinear radiodense foreign body measuring approximately 6 cm in length is again seen in the proximal sigmoid  colon, unchanged in position since the previous study. This shows transmural perforation with associated mild colonic wall thickening and soft tissue stranding and pericolonic fat. No evidence of abnormal fluid collections. No evidence of bowel obstruction. Sigmoid diverticulosis again noted. PERITONEUM AND RETROPERITONEUM: No ascites. No free air. VASCULATURE: Aorta is normal in caliber. LYMPH NODES: No lymphadenopathy. REPRODUCTIVE ORGANS: No acute abnormality. BONES AND SOFT TISSUES: No acute osseous abnormality. No focal soft tissue abnormality. IMPRESSION: 1. Thin curvilinear radiodense foreign body in the proximal sigmoid colon with transmural perforation, unchanged in position. No evidence of abscess or free intraperitoneal air. 2. Sigmoid diverticulosis. 3. Small hiatal hernia, stable. Electronically signed by: Norleen Kil MD 10/21/2024 06:15 AM EDT RP Workstation: [id number]   CT ABDOMEN PELVIS WO CONTRAST Result Date: 10/19/2024 EXAM: CT ABDOMEN AND PELVIS WITHOUT CONTRAST 10/19/2024 01:52:54 PM TECHNIQUE: CT of the abdomen and pelvis was performed without the administration of intravenous contrast. Multiplanar reformatted images are provided for review. Automated exposure control, iterative reconstruction, and/or weight-based adjustment of the mA/kV was utilized to reduce the radiation dose to as low as reasonably achievable. COMPARISON: CT abdomen and pelvis dated 10/18/2024. CLINICAL HISTORY: Diverticulitis, complication suspected; Follow-up with passing of the foreign body. F/u foreign body. FINDINGS: LOWER CHEST: Moderate patchy ground glass opacity and minimal patchy consolidation at both lung bases, slightly improved. LIVER: The liver is unremarkable. GALLBLADDER AND BILE DUCTS: Gallbladder is unremarkable. No biliary ductal dilatation. SPLEEN: No acute abnormality. PANCREAS: No acute abnormality. ADRENAL GLANDS: No acute abnormality. KIDNEYS, URETERS AND BLADDER: No stones in the kidneys or  ureters. No hydronephrosis. No perinephric or periureteral stranding. Urinary bladder is unremarkable. GI AND BOWEL: Small hiatal hernia, unchanged. Otherwise normal nondistended stomach. No change in the position of the hyperdense curvilinear 6 cm apparent foreign body in the proximal sigmoid colon with apparent perforation of the sigmoid colon wall superiorly and inferiorly with associated stable wall thickening and surrounding fat stranding. No pericolonic free air or measurable fluid collection. Moderate underlying sigmoid diverticulosis. No dilated or thick walled small bowel loops. Normal appendix. PERITONEUM AND RETROPERITONEUM: No ascites. No free air. VASCULATURE: Aorta is normal in caliber. LYMPH NODES: No lymphadenopathy. REPRODUCTIVE ORGANS: No acute abnormality. BONES AND SOFT TISSUES: Moderate lumbar spondylosis. No acute osseous abnormality. No focal soft tissue abnormality. IMPRESSION: 1. Stable CT compared to yesterday. Unchanged position of hyperdense 6 cm curvilinear apparent foreign body in the proximal sigmoid colon with apparent mural perforations, associated wall thickening, and surrounding fat stranding; no pericolonic free air or measurable fluid collection. Electronically signed by: Selinda Blue MD 10/19/2024 02:36 PM EDT RP Workstation: [id number]      Impression/plan   Foreign body in colon Last  colonoscopy 2009 CT abdomen pelvis on admission with foreign body perforating sigmoid colon at both ends with moderate surrounding inflammatory change, thought to be chicken bone, surgery feels there is no suspicion of associated perforation with normal physical exam.  No complaints of abdominal pain, nausea, vomiting.  Repeat CT abdomen pelvis shows unchanged position of hyperdense 6 cm curvilinear abdomen foreign body in the proximal sigmoid colon with apparent mural perforations and associated wall thickening and fat stranding after completing half a gallon of GoLytely  Resolution of  leukocytosis Hgb 9.5 (possibly hemodilution) since iron studies are normal - Colonoscopy tomorrow - Complete the rest of the GoLytely  today - Clear liquid diet - N.p.o. 10 AM 10/29  Multifocal pneumonia Noted on CT abdomen pelvis On IV vancomycin  and IV Zosyn   History of atrial flutter/paroxysmal A-fib  History of chronic diastolic heart failure  COPD  CAD  History of seizure On Lamictal   Thank you for your kind consultation, we will continue to follow.   Bayley CHRISTELLA Blower  10/21/2024, 9:36 AM     Attending physician's note   I have taken history, reviewed the chart and examined the patient. I performed a substantive portion of this encounter, including complete performance of at least one of the key components, in conjunction with the APP. I agree with the Advanced Practitioner's note, impression and recommendations.   6 cm FB (?chicken bone) in Px sigmoid colon with surrounding inflammatory change.  No free perforation. No peritoneal signs. Had   elevated WBC count but better with A/Bs.  Plan: -Pt only took 1/4 prep yesterday. Finish today for colon tomorrow -Higher than normal  risk for perforation.  Appreciate surgery standing by. -Continue antibiotics. -Trend CBC, CMP.  -Low threshold for repeating CT after colonoscopy.  Explained in detail to the patient risks and benefits including significant risk for perforation requiring laparotomy, colostomy.  The benefits were also discussed.  He does understand that other option would be to undergo surgery. Pt would want to try to get it removed endoscopically and is willing to take the chance.   [name] Bring, MD [facility] [phone]

## 2024-10-21 NOTE — Progress Notes (Signed)
 Pt output 300 ml of red bloody urine, one clot noticed in urine, pt in no pain able to urinate fine and VS stable, NP JD aware. Will continue with plan of care.

## 2024-10-21 NOTE — Evaluation (Addendum)
 Occupational Therapy Evaluation Patient Details Name: [name] [name] MRN: [id number] DOB: [birth year] Today's Date: 10/21/2024   History of Present Illness   Patient is a 73 year old male who presented to the ED with c/o abdominal discomfort, presyncope, and N/V.  Dx with sepsis in setting of acute diverticulitis and multifocal PNA.  Colonoscopy scheduled for 10/29.  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  Patient reports being independent at baseline with ADL and IADL activities which include driving and community engagement.  Acute illness impacts patient's activity tolerance and independence with self-care and functional mobility, with current ADL activities requiring up to CGA assistance, and patient will benefit from acute OT services to address said deficits as well as to provide education in energy conservation strategies.  Acute OT will continue to follow patient while in the hospital to address performance deficits described below.  Thank you for allowing us  to participate in the care of this patient.    If plan is discharge home, recommend the following:   A little help with walking and/or transfers     Functional Status Assessment   Patient has had a recent decline in their functional status and demonstrates the ability to make significant improvements in function in a reasonable and predictable amount of time.     Equipment Recommendations   None recommended by OT      Precautions/Restrictions   Precautions Precautions: Fall Recall of Precautions/Restrictions: Intact Restrictions Weight Bearing Restrictions Per Provider Order: No     Mobility Bed Mobility   General bed mobility comments: Ambulating in hall with PT upon arrival    Transfers Overall transfer  level: Needs assistance Transfers: Sit to/from Stand Sit to Stand: Supervision   General transfer comment: Patient plopped during stand > sit      Balance Overall balance assessment: Needs assistance Sitting-balance support: No upper extremity supported Sitting balance-Leahy Scale: Good   Standing balance support: During functional activity, Single extremity supported Standing balance-Leahy Scale: Fair Standing balance comment: Static, able to release BUE from device to wash hands, dynamic using RW or IV pole     ADL either performed or assessed with clinical judgement   ADL Overall ADL's : Needs assistance/impaired Eating/Feeding: Independent   Grooming: Wash/dry hands;Standing;Modified independent   Upper Body Bathing: Supervision/ safety;Sitting   Lower Body Bathing: Supervison/ safety;Sitting/lateral leans   Upper Body Dressing : Supervision/safety;Sitting   Lower Body Dressing: Contact guard assist;Sitting/lateral leans   Toilet Transfer: Supervision/safety;Comfort height toilet;Grab bars   Toileting- Clothing Manipulation and Hygiene: Contact guard assist;Sit to/from stand   Functional mobility during ADLs: Supervision/safety General ADL Comments: Patient will benefit from education in energy conservation     Vision Baseline Vision/History: 0 No visual deficits Ability to See in Adequate Light: 0 Adequate Patient Visual Report: No change from baseline Vision Assessment?: No apparent visual deficits            Pertinent Vitals/Pain Pain Assessment Pain Assessment: No/denies pain     Extremity/Trunk Assessment Upper Extremity Assessment Upper Extremity Assessment: Defer to OT evaluation   Lower Extremity Assessment Lower Extremity Assessment: Overall WFL for tasks assessed (AROM WFL, strength grossly 3+/5, denies numbness/tingling throughout)   Cervical / Trunk Assessment Cervical / Trunk Assessment: Normal   Communication  Communication Communication: No apparent difficulties  Cognition Arousal: Alert Behavior During Therapy: WFL for tasks assessed/performed Cognition: No apparent impairments   Following commands: Intact       General Comments   Patient reported occasional difficulty with toilet transfers at home           Home Living Family/patient expects to be discharged to:: Private residence Living Arrangements: Other (Comment) (75 yo godson) Available Help at Discharge: Family;Available PRN/intermittently Type of Home:  (townhome) Home Access: Stairs to enter;Level entry Entrance Stairs-Number of Steps: 4-5 with bil handrial at front door; back door is level entry so pt uses that door and parks near it Entrance Stairs-Rails: Right;Left Home Layout: One level Bathroom Shower/Tub: Engineer, Manufacturing Systems: Standard (Patient reports elevated toilet in half-bath) Bathroom Accessibility: No Home Equipment: Agricultural Consultant (2 wheels);Cane - single point;Electric scooter   Additional Comments: pt reports RW and SPC in the closet      Prior Functioning/Environment Prior Level of Function : Independent/Modified Independent;Driving   Mobility Comments: pt reports ind without AD ADLs Comments: pt reports ind with self care and household chores    OT Problem List: Decreased strength;Decreased activity tolerance   OT Treatment/Interventions: Self-care/ADL training;Therapeutic exercise;Energy conservation;Therapeutic activities;Patient/family education      OT Goals(Current goals can be found in the care plan section)   Acute Rehab OT Goals Patient Stated Goal: Get better and return home OT Goal Formulation: With patient Time For Goal Achievement: 11/04/24 Potential to Achieve Goals: Good ADL Goals Pt Will Perform Lower Body Dressing: with modified independence;sit to/from stand (while demonstrating understanding of energy conservation strategies.) Pt Will Transfer to Toilet: with  modified independence;grab bars Pt Will Perform Toileting - Clothing Manipulation and hygiene: with modified independence;sit to/from stand (while demonstrating understanding of energy conservation strategies.) Additional ADL Goal #1: Patient will verbalie 3 energy conservation strategies with 100% accuracy to support safe return home at [facility].   OT Frequency:  Min 2X/week       AM-PAC OT 6 Clicks Daily Activity     Outcome Measure Help from another person eating meals?: None Help from another person taking care of personal grooming?: None Help from another person toileting, which includes using toliet, bedpan, or urinal?: A Little Help from another person bathing (including washing, rinsing, drying)?: A Little Help from another person to put on and taking off regular upper body clothing?: A Little Help from another person to put on and taking off regular lower body clothing?: A Little 6 Click Score: 20   End of Session Equipment Utilized During Treatment: Gait belt;Other (comment) (IV pole) Nurse Communication: Other (comment);Mobility status (Blood in urine)  Activity Tolerance: Patient tolerated treatment well;Patient limited by fatigue Patient left: in chair;with call bell/phone within reach  OT Visit Diagnosis: Muscle weakness (generalized) (M62.81);Dizziness and giddiness (R42);Unsteadiness on feet (R26.81)                Time: 8864-8789 OT Time Calculation (min): 35 min Charges:  OT General Charges $OT Visit: 1 Visit OT Evaluation $OT Eval Low Complexity: 1 Low OT Treatments $Self Care/Home Management : 8-22 mins  Etola Mull B. Daishaun Ayre, MS, OTR/L 10/21/2024, 4:25 PM

## 2024-10-21 NOTE — Plan of Care (Signed)

## 2024-10-21 NOTE — Progress Notes (Signed)
 PROGRESS NOTE    [name] [name]  FMW:[id number] DOB: September 12, 1951 DOA: 10/18/2024 PCP: [name], MD    Chief Complaint  Patient presents with   Emesis   Loss of Consciousness    Brief Narrative:  Patient 73 year old gentleman history of paroxysmal A-fib, a flutter, chronic diastolic heart failure, CAD status post stent placement, SSS, COPD, GERD, seizure, BPH, hypogonadism, gout, chronic tinnitus and vertigo, NIDDM type II presented to the ED with episode of nausea vomiting abdominal discomfort and presyncopal episode.  Patient seen in the ED noted to be febrile with a temp of 102, tachypneic otherwise hemodynamically stable.  Lab work concerning for leukocytosis.  CT abdomen and pelvis done concerning for intraluminal foreign body with concerns for colonic perforation and moderate surrounding inflammatory changes as well as diverticulosis, patchy bibasilar airspace infiltrates consistent with acute multifocal infection.  CT head unremarkable.  Patient placed on empiric IV antibiotics.  General surgery consulted.  Patient underwent repeat CT scans of the abdomen and pelvis with unchanged position of foreign body, GI consulted.  Patient for colonoscopy 10/22/2024.   Assessment & Plan:   Principal Problem:   Acute diverticulitis Active Problems:   Multifocal pneumonia   Foreign body alimentary tract-questionable   Paroxysmal atrial fibrillation (HCC)   Chronic vertigo   Sick sinus syndrome (HCC)   COPD (chronic obstructive pulmonary disease) (HCC)   Syncope and collapse   Chronic diastolic CHF (congestive heart failure) (HCC)   History of CAD (coronary artery disease)   History of seizure   Non-insulin dependent type 2 diabetes mellitus (HCC)  #1 sepsis in the setting of probable acute diverticulitis and multifocal pneumonia/foreign body in sigmoid colon  - Patient on admission had presented with sudden onset abdominal pain, nausea vomiting presyncopal episode. - Patient on  admission noted to be tachypneic, noted to have a leukocytosis, afebrile with a temp of 102. - Patient with slightly elevated lactic acid level that has improved. -- CT abdomen pelvis showing Wire-like metallic intraluminal foreign body (~6 cm) perforating the sigmoid colon at both ends with moderate surrounding inflammatory change. No free intraperitoneal gas or fluid, no pericolonic fluid collection, and no bowel obstruction. Surgical consultation recommended.  Moderate descending and sigmoid diverticulosis.  - Patient with no acute abdominal signs or symptoms on examination without any rebound or guarding or abdominal rigidity. - Abdominal exam benign. - Per admitting physician EDP discussed with general surgery who felt there is no suspicion of foreign body or associated perforation as patient with no signs of perforation on examination and recommended treatment for diverticulitis with serial abdominal exams. - General Surgery noted to have evaluated patient on admission with concern for  possible chicken bone foreign body in the sigmoid colon.  Recommended to keep n.p.o. and bowel rest.  IV antibiotics Zosyn .  Serial exam. Most likely will consider repeating CAT scan to see if the foreign body has passed. If not or patient worse, may require operative resection and Hartmann resection if he truly has perforated his colon. There is no need for emergency surgery tonight but surgery will help follow.  - Repeat CT abdomen and pelvis done with stable CT, unchanged position of hyperdense 6 cm curvilinear abdomen foreign body in the proximal sigmoid colon with apparent mural perforations, associated wall thickening, surrounding fat stranding, no pericolonic free air or measurable free fluid. -- Patient seen by general surgery and patient with a benign abdominal exam.   - Patient placed on clear liquids and GoLytely  bowel prep ordered and  patient underwent repeat CT 10/21/2024 with thin curvilinear radiodense  foreign body in the proximal sigmoid colon with transmural perforation, unchanged in position.  No evidence of abscess or free intraperitoneal air.  Sigmoid diverticulosis.  Small hiatal hernia. - Patient seen in consultation by GI and patient being scheduled for colonoscopy tomorrow, 10/22/2024.  -Continue empiric IV antibiotics. -General surgery feels patient is at risk for progressing to frank perforation at any point which would lead to surgery for resection of the segment of colon. - GI and General Surgery following and appreciate the input and recommendations.  2.  Multifocal pneumonia -Noted on CT abdomen and pelvis. - Patient noted to have met criteria for sepsis with leukocytosis, tachypnea, fever. - MRSA PCR detected.   - Blood cultures with no growth to date.   - Continue IV Zosyn .   - DC IV vancomycin .   - Continue Pulmicort , nebs as needed.   - Supportive care.   3.  History of atrial flutter/paroxysmal A-fib -Currently normal sinus rhythm. - Continue digoxin , Toprol -XL 25 mg daily for rate control. - Patient noted to have been prescribed amiodarone  however per admitting physician per chart review and in discussion with pharmacist patient currently is not on amiodarone  we will hold off on reinitiating amiodarone  at this time. - It is noted that amiodarone  had not been refilled over the past 6 months. - Patient noted to not be a anticoagulation candidate due to history of recurrent falls from chronic tinnitus and vertigo. - Continue aspirin  81 mg daily.  4.  History of SSS -Outpatient follow-up.  5.  Chronic diastolic heart failure - Continue Toprol -XL, digoxin .   - Lasix  and losartan on hold.   6.  History of COPD -Stable. - DuoNebs as needed.  7.  History of CAD -Stable - Toprol -XL.    8.  GERD - Continue IV PPI.  9.  History of seizure - Continue Lamictal .    10.  Non-insulin-dependent diabetes mellitus type 2 -Hemoglobin A1c noted at 5.9 (02/01/2024) -  Repeat hemoglobin A1c 5.3. -Noted to be on Jardiance  prior to admission which we will continue to hold in house. - Outpatient follow-up.     DVT prophylaxis: SCDs Code Status: Full Family Communication: Updated patient.  No family at bedside. Disposition: TBD  Status is: Inpatient Remains inpatient appropriate because: Severity of illness   Consultants:  General Surgery: Dr. Sheldon 10/18/2024 Gastroenterology: Dr. Charlanne 10/21/2024  Procedures:  CT abdomen and pelvis 10/18/2024, 10/19/2024, 10/21/2024 CT head 10/18/2024 Abdominal films 10/18/2024, 10/19/2024  Antimicrobials:  Anti-infectives (From admission, onward)    Start     Dose/Rate Route Frequency Ordered Stop   10/19/24 2330  vancomycin  (VANCOCIN ) IVPB 1000 mg/200 mL premix        1,000 mg 200 mL/hr over 60 Minutes Intravenous Every 24 hours 10/18/24 2321     10/19/24 0600  piperacillin -tazobactam (ZOSYN ) IVPB 3.375 g        3.375 g 12.5 mL/hr over 240 Minutes Intravenous Every 8 hours 10/18/24 2319 10/24/24 0559   10/18/24 2345  piperacillin -tazobactam (ZOSYN ) IVPB 3.375 g        3.375 g 100 mL/hr over 30 Minutes Intravenous  Once 10/18/24 2333 10/19/24 0016   10/18/24 2330  azithromycin (ZITHROMAX) 500 mg in sodium chloride  0.9 % 250 mL IVPB  Status:  Discontinued        500 mg 250 mL/hr over 60 Minutes Intravenous  Once 10/18/24 2317 10/18/24 2324   10/18/24 2330  vancomycin  (VANCOREADY) IVPB 2000 mg/400  mL        2,000 mg 200 mL/hr over 120 Minutes Intravenous  Once 10/18/24 2319 10/19/24 0931   10/18/24 2315  cefTRIAXone  (ROCEPHIN ) 1 g in sodium chloride  0.9 % 100 mL IVPB  Status:  Discontinued        1 g 200 mL/hr over 30 Minutes Intravenous  Once 10/18/24 2300 10/18/24 2303   10/18/24 2315  azithromycin (ZITHROMAX) 500 mg in sodium chloride  0.9 % 250 mL IVPB  Status:  Discontinued        500 mg 250 mL/hr over 60 Minutes Intravenous  Once 10/18/24 2300 10/18/24 2303   10/18/24 2315  piperacillin -tazobactam  (ZOSYN ) IVPB 3.375 g  Status:  Discontinued        3.375 g 100 mL/hr over 30 Minutes Intravenous  Once 10/18/24 2306 10/18/24 2332         Subjective: Patient sitting up in bed drinking bowel prep.  Denies any chest pain or shortness of breath.  Denies any abdominal pain.  Hungry.   Objective: Vitals:   10/21/24 0517 10/21/24 0838 10/21/24 0843 10/21/24 0910  BP: 122/65   122/65  Pulse: (!) 56   (!) 56  Resp:      Temp: 98.1 F (36.7 C)     TempSrc: Oral     SpO2: 100% 98% 99%   Weight:      Height:        Intake/Output Summary (Last 24 hours) at 10/21/2024 0951 Last data filed at 10/21/2024 0915 Gross per 24 hour  Intake 1095.77 ml  Output 2275 ml  Net -1179.23 ml   Filed Weights   10/18/24 1742  Weight: 106.6 kg    Examination:  General exam: NAD. Respiratory system: Decreased breath sounds in the bases.  No wheezing.  No crackles.  Fair air movement.  Speaking in full sentences.   Cardiovascular system: RRR no murmurs rubs or gallops.  No JVD.  No pitting lower extremity edema.  Gastrointestinal system: Abdomen is soft, nontender, nondistended, positive bowel sounds.  No rebound.  No guarding.  Central nervous system: Alert and oriented. No focal neurological deficits. Extremities: Symmetric 5 x 5 power. Skin: No rashes, lesions or ulcers Psychiatry: Judgement and insight appear normal. Mood & affect appropriate.     Data Reviewed: I have personally reviewed following labs and imaging studies  CBC: Recent Labs  Lab 10/18/24 2006 10/19/24 0424 10/20/24 1002 10/21/24 0741  WBC 15.2* 24.8* 15.3* 9.8  NEUTROABS 13.2*  --   --   --   HGB 12.1* 9.7* 11.0* 9.5*  HCT 42.2 31.9* 36.6* 31.6*  MCV 73.9* 73.8* 71.2* 74.4*  PLT 395 306 264 280    Basic Metabolic Panel: Recent Labs  Lab 10/18/24 2006 10/19/24 0424 10/20/24 1002 10/21/24 0741  NA 142 139 136 137  K 4.0 4.1 4.6 3.8  CL 104 105 104 104  CO2 25 24 20* 24  GLUCOSE 81 105* 89 83  BUN 13  15 15 11   CREATININE 1.40* 1.51* 1.48* 1.62*  CALCIUM  9.4 8.6* 8.9 8.9  MG 2.1  --   --   --     GFR: Estimated Creatinine Clearance: 45.9 mL/min (A) (by C-G formula based on SCr of 1.62 mg/dL (H)).  Liver Function Tests: Recent Labs  Lab 10/18/24 2006 10/19/24 0424  AST 28 21  ALT 13 8  ALKPHOS 98 75  BILITOT 0.5 0.5  PROT 8.6* 6.6  ALBUMIN  3.7 2.9*    CBG: No results for  input(s): GLUCAP in the last 168 hours.   Recent Results (from the past 240 hours)  Resp panel by RT-PCR (RSV, Flu A&B, Covid) Anterior Nasal Swab     Status: None   Collection Time: 10/18/24 10:51 PM   Specimen: Anterior Nasal Swab  Result Value Ref Range Status   SARS Coronavirus 2 by RT PCR NEGATIVE NEGATIVE Final    Comment: (NOTE) SARS-CoV-2 target nucleic acids are NOT DETECTED.  The SARS-CoV-2 RNA is generally detectable in upper respiratory specimens during the acute phase of infection. The lowest concentration of SARS-CoV-2 viral copies this assay can detect is 138 copies/mL. A negative result does not preclude SARS-Cov-2 infection and should not be used as the sole basis for treatment or other patient management decisions. A negative result may occur with  improper specimen collection/handling, submission of specimen other than nasopharyngeal swab, presence of viral mutation(s) within the areas targeted by this assay, and inadequate number of viral copies(<138 copies/mL). A negative result must be combined with clinical observations, patient history, and epidemiological information. The expected result is Negative.  Fact Sheet for Patients:  bloggercourse.com  Fact Sheet for Healthcare Providers:  seriousbroker.it  This test is no t yet approved or cleared by the [place]  FDA and  has been authorized for detection and/or diagnosis of SARS-CoV-2 by FDA under an Emergency Use Authorization (EUA). This EUA will remain  in effect  (meaning this test can be used) for the duration of the COVID-19 declaration under Section 564(b)(1) of the Act, 21 U.S.C.section 360bbb-3(b)(1), unless the authorization is terminated  or revoked sooner.       Influenza A by PCR NEGATIVE NEGATIVE Final   Influenza B by PCR NEGATIVE NEGATIVE Final    Comment: (NOTE) The Xpert Xpress SARS-CoV-2/FLU/RSV plus assay is intended as an aid in the diagnosis of influenza from Nasopharyngeal swab specimens and should not be used as a sole basis for treatment. Nasal washings and aspirates are unacceptable for Xpert Xpress SARS-CoV-2/FLU/RSV testing.  Fact Sheet for Patients: bloggercourse.com  Fact Sheet for Healthcare Providers: seriousbroker.it  This test is not yet approved or cleared by the [place]  FDA and has been authorized for detection and/or diagnosis of SARS-CoV-2 by FDA under an Emergency Use Authorization (EUA). This EUA will remain in effect (meaning this test can be used) for the duration of the COVID-19 declaration under Section 564(b)(1) of the Act, 21 U.S.C. section 360bbb-3(b)(1), unless the authorization is terminated or revoked.     Resp Syncytial Virus by PCR NEGATIVE NEGATIVE Final    Comment: (NOTE) Fact Sheet for Patients: bloggercourse.com  Fact Sheet for Healthcare Providers: seriousbroker.it  This test is not yet approved or cleared by the [place]  FDA and has been authorized for detection and/or diagnosis of SARS-CoV-2 by FDA under an Emergency Use Authorization (EUA). This EUA will remain in effect (meaning this test can be used) for the duration of the COVID-19 declaration under Section 564(b)(1) of the Act, 21 U.S.C. section 360bbb-3(b)(1), unless the authorization is terminated or revoked.  Performed at [facility], [address] [place], [place] [address]   Culture,  blood (Routine X 2) w Reflex to ID Panel     Status: None (Preliminary result)   Collection Time: 10/18/24 11:40 PM   Specimen: BLOOD  Result Value Ref Range Status   Specimen Description   Final    BLOOD RIGHT ANTECUBITAL Performed at [facility], [address], [place], [place] [address]  Special Requests   Final    BOTTLES DRAWN AEROBIC AND ANAEROBIC Blood Culture results may not be optimal due to an inadequate volume of blood received in culture bottles Performed at [facility], [address] [place], [place] [address]    Culture   Final    NO GROWTH 2 DAYS Performed at [facility], [address] [place], [place] [address]    Report Status PENDING  Incomplete  Respiratory (~20 pathogens) panel by PCR     Status: None   Collection Time: 10/18/24 11:58 PM   Specimen: Nasopharyngeal Swab; Respiratory  Result Value Ref Range Status   Adenovirus NOT DETECTED NOT DETECTED Final   Coronavirus 229E NOT DETECTED NOT DETECTED Final    Comment: (NOTE) The Coronavirus on the Respiratory Panel, DOES NOT test for the novel  Coronavirus (2019 nCoV)    Coronavirus HKU1 NOT DETECTED NOT DETECTED Final   Coronavirus NL63 NOT DETECTED NOT DETECTED Final   Coronavirus OC43 NOT DETECTED NOT DETECTED Final   Metapneumovirus NOT DETECTED NOT DETECTED Final   Rhinovirus / Enterovirus NOT DETECTED NOT DETECTED Final   Influenza A NOT DETECTED NOT DETECTED Final   Influenza B NOT DETECTED NOT DETECTED Final   Parainfluenza Virus 1 NOT DETECTED NOT DETECTED Final   Parainfluenza Virus 2 NOT DETECTED NOT DETECTED Final   Parainfluenza Virus 3 NOT DETECTED NOT DETECTED Final   Parainfluenza Virus 4 NOT DETECTED NOT DETECTED Final   Respiratory Syncytial Virus NOT DETECTED NOT DETECTED Final   Bordetella pertussis NOT DETECTED NOT DETECTED Final   Bordetella Parapertussis NOT DETECTED NOT DETECTED Final   Chlamydophila pneumoniae NOT DETECTED NOT  DETECTED Final   Mycoplasma pneumoniae NOT DETECTED NOT DETECTED Final    Comment: Performed at [facility], [address] [place], [place] [address]  Culture, blood (Routine X 2) w Reflex to ID Panel     Status: None (Preliminary result)   Collection Time: 10/19/24  1:43 AM   Specimen: BLOOD  Result Value Ref Range Status   Specimen Description   Final    BLOOD BLOOD RIGHT HAND Performed at [facility], [address], [place], [place] [address]    Special Requests   Final    BOTTLES DRAWN AEROBIC AND ANAEROBIC Blood Culture adequate volume Performed at [facility], [address], [place], [place] [address]    Culture   Final    NO GROWTH 2 DAYS Performed at [facility], [address] [place], [place] [address]    Report Status PENDING  Incomplete  MRSA Next Gen by PCR, Nasal     Status: Abnormal   Collection Time: 10/20/24  3:08 PM   Specimen: Nasal Mucosa; Nasal Swab  Result Value Ref Range Status   MRSA by PCR Next Gen DETECTED (A) NOT DETECTED Final    Comment: (NOTE) The GeneXpert MRSA Assay (FDA approved for NASAL specimens only), is one component of a comprehensive MRSA colonization surveillance program. It is not intended to diagnose MRSA infection nor to guide or monitor treatment for MRSA infections. Test performance is not FDA approved in patients less than 45 years old. Performed at [facility], [address] [place], [place] [address]          Radiology Studies: CT ABDOMEN PELVIS WO CONTRAST Result Date: 10/21/2024 EXAM: CT ABDOMEN AND PELVIS WITHOUT CONTRAST 10/21/2024 05:58:39 AM TECHNIQUE: CT of the abdomen and pelvis was  performed without the administration of intravenous contrast. Multiplanar reformatted images are provided for review. Automated exposure control, iterative reconstruction, and/or weight-based adjustment of the mA/kV was utilized to reduce the radiation dose to  as low as reasonably achievable. COMPARISON: 10/19/2024 CLINICAL HISTORY: Foreign body, colon; chicken bone with colonic perforation. FINDINGS: LOWER CHEST: Patchy ground glass opacity and minimal consolidation in both lung bases, without significant change. Stable small hiatal hernia. LIVER: The liver is unremarkable. GALLBLADDER AND BILE DUCTS: Gallbladder is unremarkable. No biliary ductal dilatation. SPLEEN: No acute abnormality. PANCREAS: No acute abnormality. ADRENAL GLANDS: No acute abnormality. KIDNEYS, URETERS AND BLADDER: No stones in the kidneys or ureters. No hydronephrosis. No perinephric or periureteral stranding. Urinary bladder is unremarkable. GI AND BOWEL: Stomach demonstrates no acute abnormality. A thin curvilinear radiodense foreign body measuring approximately 6 cm in length is again seen in the proximal sigmoid colon, unchanged in position since the previous study. This shows transmural perforation with associated mild colonic wall thickening and soft tissue stranding and pericolonic fat. No evidence of abnormal fluid collections. No evidence of bowel obstruction. Sigmoid diverticulosis again noted. PERITONEUM AND RETROPERITONEUM: No ascites. No free air. VASCULATURE: Aorta is normal in caliber. LYMPH NODES: No lymphadenopathy. REPRODUCTIVE ORGANS: No acute abnormality. BONES AND SOFT TISSUES: No acute osseous abnormality. No focal soft tissue abnormality. IMPRESSION: 1. Thin curvilinear radiodense foreign body in the proximal sigmoid colon with transmural perforation, unchanged in position. No evidence of abscess or free intraperitoneal air. 2. Sigmoid diverticulosis. 3. Small hiatal hernia, stable. Electronically signed by: Norleen Kil MD 10/21/2024 06:15 AM EDT RP Workstation: [id number]   CT ABDOMEN PELVIS WO CONTRAST Result Date: 10/19/2024 EXAM: CT ABDOMEN AND PELVIS WITHOUT CONTRAST 10/19/2024 01:52:54 PM TECHNIQUE: CT of the abdomen and pelvis was performed without the  administration of intravenous contrast. Multiplanar reformatted images are provided for review. Automated exposure control, iterative reconstruction, and/or weight-based adjustment of the mA/kV was utilized to reduce the radiation dose to as low as reasonably achievable. COMPARISON: CT abdomen and pelvis dated 10/18/2024. CLINICAL HISTORY: Diverticulitis, complication suspected; Follow-up with passing of the foreign body. F/u foreign body. FINDINGS: LOWER CHEST: Moderate patchy ground glass opacity and minimal patchy consolidation at both lung bases, slightly improved. LIVER: The liver is unremarkable. GALLBLADDER AND BILE DUCTS: Gallbladder is unremarkable. No biliary ductal dilatation. SPLEEN: No acute abnormality. PANCREAS: No acute abnormality. ADRENAL GLANDS: No acute abnormality. KIDNEYS, URETERS AND BLADDER: No stones in the kidneys or ureters. No hydronephrosis. No perinephric or periureteral stranding. Urinary bladder is unremarkable. GI AND BOWEL: Small hiatal hernia, unchanged. Otherwise normal nondistended stomach. No change in the position of the hyperdense curvilinear 6 cm apparent foreign body in the proximal sigmoid colon with apparent perforation of the sigmoid colon wall superiorly and inferiorly with associated stable wall thickening and surrounding fat stranding. No pericolonic free air or measurable fluid collection. Moderate underlying sigmoid diverticulosis. No dilated or thick walled small bowel loops. Normal appendix. PERITONEUM AND RETROPERITONEUM: No ascites. No free air. VASCULATURE: Aorta is normal in caliber. LYMPH NODES: No lymphadenopathy. REPRODUCTIVE ORGANS: No acute abnormality. BONES AND SOFT TISSUES: Moderate lumbar spondylosis. No acute osseous abnormality. No focal soft tissue abnormality. IMPRESSION: 1. Stable CT compared to yesterday. Unchanged position of hyperdense 6 cm curvilinear apparent foreign body in the proximal sigmoid colon with apparent mural perforations,  associated wall thickening, and surrounding fat stranding; no pericolonic free air or measurable fluid collection. Electronically signed by: Selinda Blue MD 10/19/2024 02:36 PM EDT RP Workstation: [id number]  Scheduled Meds:  atorvastatin   40 mg Oral Daily   budesonide  (PULMICORT ) nebulizer solution  0.5 mg Nebulization BID   Chlorhexidine  Gluconate Cloth  6 each Topical Daily   digoxin   0.125 mg Oral Daily   ferrous sulfate   325 mg Oral Q breakfast   lamoTRIgine   100 mg Oral BID   metoprolol  succinate  25 mg Oral Daily   mupirocin  ointment  1 Application Nasal BID   pantoprazole  (PROTONIX ) IV  40 mg Intravenous Q24H   sodium chloride  flush  3 mL Intravenous Q12H   tamsulosin   0.4 mg Oral Daily   Continuous Infusions:  sodium chloride      lactated ringers  100 mL/hr at 10/21/24 0749   piperacillin -tazobactam (ZOSYN )  IV 3.375 g (10/21/24 9388)   vancomycin  1,000 mg (10/21/24 0010)     LOS: 3 days    Time spent: 40 minutes    Toribio Hummer, MD [facility]   To contact the attending provider between 7A-7P or the covering provider during after hours 7P-7A, please log into the web site [email or web address] and access using universal [place] password for that web site. If you do not have the password, please call the hospital operator.  10/21/2024, 9:51 AM

## 2024-10-21 NOTE — H&P (View-Only) (Signed)
 Consultation  Referring Provider:  TR[facility]  Primary Care Physician:  [name], MD Primary Gastroenterologist:  Dr. Legrand       Reason for Consultation:    foreign object in colon   LOS: 3 days          HPI:   [name] [name] is a 73 y.o. male with past medical history significant for paroxysmal A-fib, a flutter, chronic diastolic heart failure, CAD status post stent placement, SSS, COPD, GERD, seizure, BPH, hypogonadism, gout, chronic tinnitus and vertigo, NIDDM type II, presents for evaluation of foreign object in colon  Patient states Saturday he was cooking dinner which involved fried chicken, rice, beef, and halfway through his meal he began feeling altered and was unsure if he passed out or was having vertigo.  He denied nausea, vomiting, abdominal pain.  He does have a history of epilepsy so he was unsure if he had a seizure.  Upon arrival to ED he was found to be febrile with temperature 102 F, tachypneic, leukocytosis with WBC 15.2  CT abdomen pelvis without contrast showed wire like metallic foreign body (6 cm) perforating the sigmoid colon at both ends with moderate surrounding inflammation.  No pericolonic fluid collection, no intraperitoneal gas or fluid, no bowel obstruction.  And was also found to have pneumonia  Patient was evaluated by surgery and since patient was not found to be perforated and there was no abdominal pain surgery was held off and GI was consulted  Patient completed half of the GoLytely  prep and repeat CT abdomen pelvis without contrast showed foreign body in proximal sigmoid colon with transmural perforation unchanged in position.  It is suspected to be a chicken bone.  Last colonoscopy reportedly in 2009.  Patient was scheduled to do Cologuard but this has not been completed  Past Medical History:  Diagnosis Date   Anemia    Asthma    Atrial fibrillation (HCC)    CHF (congestive heart failure) (HCC)    COPD (chronic obstructive pulmonary  disease) (HCC)    Heart disease    Hypertension    Syncope     Surgical History:  He  has a past surgical history that includes Cardiac catheterization (N/A, 12/08/2015); Shoulder Closed Reduction (Left, 12/01/2017); Esophagoscopy (2002); and Foreign body removal esophageal (2002). Family History:  His family history includes Cancer in his mother. Social History:   reports that he has never smoked. He has never used smokeless tobacco. He reports that he does not drink alcohol and does not use drugs.  Prior to Admission medications   Medication Sig Start Date End Date Taking? Authorizing Provider  acetaminophen  (TYLENOL ) 325 MG tablet Take 2 tablets (650 mg total) by mouth every 6 (six) hours as needed for mild pain (or Fever >/= 101). 12/03/17  Yes Rai, Ripudeep K, MD  albuterol  (PROVENTIL  HFA;VENTOLIN  HFA) 108 (90 Base) MCG/ACT inhaler Inhale 2 puffs into the lungs every 6 (six) hours as needed. 11/19/17  Yes Newlin, Enobong, MD  albuterol  (PROVENTIL ) (2.5 MG/3ML) 0.083% nebulizer solution Take 3 mLs (2.5 mg total) by nebulization every 6 (six) hours as needed for wheezing or shortness of breath. 08/17/17  Yes Newlin, Enobong, MD  allopurinol  (ZYLOPRIM ) 300 MG tablet TAKE 1 TABLET BY MOUTH EVERY DAY 09/28/24  Yes Newlin, Enobong, MD  ARNUITY ELLIPTA  100 MCG/ACT AEPB Inhale 1 puff into the lungs daily. Patient taking differently: Inhale 1 puff into the lungs daily as needed. 11/20/18  Yes Ramaswamy, Dorethia,  MD  aspirin  EC 81 MG tablet Take 81 mg by mouth daily as needed.   Yes [provider]  atorvastatin  (LIPITOR) 40 MG tablet Take 1 tablet (40 mg total) by mouth daily. 07/10/24  Yes Newlin, Enobong, MD  digoxin  (LANOXIN ) 0.125 MG tablet Take 1 tablet (0.125 mg total) by mouth daily. 12/03/17  Yes Rai, Ripudeep K, MD  empagliflozin  (JARDIANCE ) 10 MG TABS tablet Take 1 tablet (10 mg total) by mouth daily before breakfast. 07/10/24  Yes Newlin, Enobong, MD  ferrous sulfate  325 (65 FE)  MG tablet Take 325 mg by mouth daily with breakfast. Reported on 03/16/2016   Yes [provider]  fluticasone  (FLONASE ) 50 MCG/ACT nasal spray Place 2 sprays into both nostrils daily. 07/10/24  Yes Newlin, Enobong, MD  furosemide  (LASIX ) 20 MG tablet TAKE 1 TABLET BY MOUTH EVERY DAY Patient taking differently: Take 20 mg by mouth daily as needed. 02/21/24  Yes Newlin, Enobong, MD  lamoTRIgine  (LAMICTAL ) 100 MG tablet TAKE 1 TABLET BY MOUTH TWICE A DAY 01/04/24  Yes Onita Duos, MD  metoprolol  succinate (TOPROL -XL) 25 MG 24 hr tablet TAKE 1 TABLET (25 MG TOTAL) BY MOUTH DAILY. 07/17/24  Yes Newlin, Enobong, MD  nitroGLYCERIN  (NITROSTAT ) 0.4 MG SL tablet Place 1 tablet (0.4 mg total) under the tongue every 5 (five) minutes as needed for chest pain. 01/25/14  Yes Claudene Pacific, MD  pantoprazole  (PROTONIX ) 40 MG tablet Take 1 tablet (40 mg total) by mouth daily. 07/10/24  Yes Newlin, Enobong, MD  potassium chloride  (K-DUR) 10 MEQ tablet Take 10 mEq by mouth daily.   Yes [provider]  tacrolimus (PROTOPIC) 0.1 % ointment Apply 1 Application topically 2 (two) times daily. 07/19/21  Yes [provider]  tamsulosin  (FLOMAX ) 0.4 MG CAPS capsule Take 1 capsule (0.4 mg total) by mouth daily. 07/10/24  Yes Newlin, Enobong, MD  amiodarone  (PACERONE ) 200 MG tablet Take 1 tablet (200 mg total) by mouth 2 (two) times daily. Patient not taking: Reported on 10/19/2024 12/03/17   Rai, Nydia POUR, MD  clotrimazole -betamethasone  (LOTRISONE ) cream Apply 1 application topically 2 (two) times daily as needed (FOR RASH). Patient not taking: Reported on 10/19/2024 12/03/17   Rai, Nydia POUR, MD  diclofenac  Sodium (VOLTAREN ) 1 % GEL Apply 4 g topically 4 (four) times daily. Patient not taking: Reported on 10/19/2024 07/11/21   Newlin, Enobong, MD  levocetirizine (XYZAL ) 5 MG tablet Take 1 tablet (5 mg total) by mouth every evening. Patient not taking: Reported on 10/19/2024 03/13/24 03/08/25  Sebastian Beverley NOVAK, MD    Current Facility-Administered Medications  Medication Dose Route Frequency Provider Last Rate Last Admin   0.9 %  sodium chloride  infusion   Intravenous Continuous McMichael, Bayley M, PA-C       acetaminophen  (TYLENOL ) tablet 650 mg  650 mg Oral Q6H PRN Sundil, Subrina, MD       Or   acetaminophen  (TYLENOL ) suppository 650 mg  650 mg Rectal Q6H PRN Sundil, Subrina, MD       alum & mag hydroxide-simeth (MAALOX/MYLANTA) 200-200-20 MG/5ML suspension 30 mL  30 mL Oral Q6H PRN Sundil, Subrina, MD       atorvastatin  (LIPITOR) tablet 40 mg  40 mg Oral Daily Sundil, Subrina, MD   40 mg at 10/21/24 0910   budesonide  (PULMICORT ) nebulizer solution 0.5 mg  0.5 mg Nebulization BID Sebastian Toribio GAILS, MD   0.5 mg at 10/21/24 9161   Chlorhexidine  Gluconate Cloth 2 % PADS 6 each  6  each Topical Daily Sebastian Toribio GAILS, MD   6 each at 10/20/24 2210   digoxin  (LANOXIN ) tablet 0.125 mg  0.125 mg Oral Daily Sundil, Subrina, MD   0.125 mg at 10/21/24 9089   ferrous sulfate  tablet 325 mg  325 mg Oral Q breakfast Sundil, Subrina, MD   325 mg at 10/21/24 0911   HYDROmorphone  (DILAUDID ) injection 0.5-1 mg  0.5-1 mg Intravenous Q2H PRN Sheldon Standing, MD       ipratropium-albuterol  (DUONEB) 0.5-2.5 (3) MG/3ML nebulizer solution 3 mL  3 mL Nebulization Q6H PRN Lee, Subrina, MD   3 mL at 10/21/24 9161   lactated ringers  infusion   Intravenous Continuous Sebastian Toribio GAILS, MD 100 mL/hr at 10/21/24 0749 New Bag at 10/21/24 0749   lamoTRIgine  (LAMICTAL ) tablet 100 mg  100 mg Oral BID Sundil, Subrina, MD   100 mg at 10/21/24 9087   magic mouthwash  15 mL Oral QID PRN Sundil, Subrina, MD       meclizine  (ANTIVERT ) tablet 12.5 mg  12.5 mg Oral TID PRN Sundil, Subrina, MD   12.5 mg at 10/19/24 0820   menthol (CEPACOL) lozenge 3 mg  1 lozenge Oral PRN Sundil, Subrina, MD       methocarbamol  (ROBAXIN ) injection 1,000 mg  1,000 mg Intravenous Q6H PRN Sundil, Subrina, MD       metoprolol  succinate (TOPROL -XL) 24 hr  tablet 25 mg  25 mg Oral Daily Sundil, Subrina, MD   25 mg at 10/21/24 9089   mupirocin  ointment (BACTROBAN ) 2 % 1 Application  1 Application Nasal BID Sebastian Toribio GAILS, MD   1 Application at 10/21/24 0915   naphazoline-glycerin (CLEAR EYES REDNESS) ophth solution 1-2 drop  1-2 drop Both Eyes QID PRN Sundil, Subrina, MD       ondansetron  (ZOFRAN ) injection 4 mg  4 mg Intravenous Q6H PRN Sundil, Subrina, MD       oxyCODONE  (Oxy IR/ROXICODONE ) immediate release tablet 5-10 mg  5-10 mg Oral Q4H PRN Sundil, Subrina, MD       pantoprazole  (PROTONIX ) injection 40 mg  40 mg Intravenous Q24H Sundil, Subrina, MD   40 mg at 10/21/24 0007   phenol (CHLORASEPTIC) mouth spray 2 spray  2 spray Mouth/Throat PRN Sundil, Subrina, MD       piperacillin -tazobactam (ZOSYN ) IVPB 3.375 g  3.375 g Intravenous Q8H Sundil, Subrina, MD 12.5 mL/hr at 10/21/24 0611 3.375 g at 10/21/24 9388   prochlorperazine (COMPAZINE) injection 5-10 mg  5-10 mg Intravenous Q4H PRN Sundil, Subrina, MD       simethicone (MYLICON) 40 MG/0.6ML suspension 80 mg  80 mg Oral QID PRN Sundil, Subrina, MD       sodium chloride  (OCEAN) 0.65 % nasal spray 1-2 spray  1-2 spray Each Nare Q6H PRN Sundil, Subrina, MD       sodium chloride  flush (NS) 0.9 % injection 3 mL  3 mL Intravenous Q12H Sundil, Subrina, MD   3 mL at 10/21/24 0915   tamsulosin  (FLOMAX ) capsule 0.4 mg  0.4 mg Oral Daily Sundil, Subrina, MD   0.4 mg at 10/21/24 0911   vancomycin  (VANCOCIN ) IVPB 1000 mg/200 mL premix  1,000 mg Intravenous Q24H Poindexter, Leann T, RPH 200 mL/hr at 10/21/24 0010 1,000 mg at 10/21/24 0010    Allergies as of 10/18/2024   (No Known Allergies)    Review of Systems  Constitutional:  Positive for fever. Negative for chills and weight loss.  HENT:  Negative for hearing loss and tinnitus.   Eyes:  Negative for blurred vision and double vision.  Respiratory:  Negative for cough and hemoptysis.   Cardiovascular:  Negative for chest pain and palpitations.   Gastrointestinal:  Negative for abdominal pain, blood in stool, constipation, diarrhea, heartburn, melena, nausea and vomiting.  Genitourinary:  Negative for dysuria and urgency.  Musculoskeletal:  Negative for myalgias and neck pain.  Skin:  Negative for itching and rash.  Neurological:  Negative for seizures and loss of consciousness.  Psychiatric/Behavioral:  Negative for depression and suicidal ideas.        Physical Exam:  Vital signs in last 24 hours: Temp:  [97.6 F (36.4 C)-98.1 F (36.7 C)] 98.1 F (36.7 C) (10/28 0517) Pulse Rate:  [54-56] 56 (10/28 0910) Resp:  [16-20] 16 (10/27 2018) BP: (103-137)/(65-97) 122/65 (10/28 0910) SpO2:  [97 %-100 %] 99 % (10/28 0843) Last BM Date : 10/18/24 Last BM recorded by nurses in past 5 days No data recorded  Physical Exam Constitutional:      Appearance: Normal appearance. He is normal weight.  HENT:     Head: Normocephalic and atraumatic.     Nose: Nose normal. No congestion.     Mouth/Throat:     Mouth: Mucous membranes are moist.     Pharynx: Oropharynx is clear.  Eyes:     General: No scleral icterus.    Extraocular Movements: Extraocular movements intact.  Cardiovascular:     Rate and Rhythm: Normal rate and regular rhythm.  Pulmonary:     Effort: Pulmonary effort is normal. No respiratory distress.  Abdominal:     General: Bowel sounds are normal. There is no distension.     Palpations: Abdomen is soft. There is no mass.     Tenderness: There is no abdominal tenderness. There is no guarding or rebound.     Hernia: No hernia is present.  Musculoskeletal:        General: No swelling. Normal range of motion.     Cervical back: Normal range of motion and neck supple.  Skin:    General: Skin is warm and dry.  Neurological:     General: No focal deficit present.     Mental Status: He is oriented to person, place, and time.  Psychiatric:        Mood and Affect: Mood normal.        Behavior: Behavior normal.         Thought Content: Thought content normal.        Judgment: Judgment normal.      LAB RESULTS: Recent Labs    10/19/24 0424 10/20/24 1002 10/21/24 0741  WBC 24.8* 15.3* 9.8  HGB 9.7* 11.0* 9.5*  HCT 31.9* 36.6* 31.6*  PLT 306 264 280   BMET Recent Labs    10/19/24 0424 10/20/24 1002 10/21/24 0741  NA 139 136 137  K 4.1 4.6 3.8  CL 105 104 104  CO2 24 20* 24  GLUCOSE 105* 89 83  BUN 15 15 11   CREATININE 1.51* 1.48* 1.62*  CALCIUM  8.6* 8.9 8.9   LFT Recent Labs    10/19/24 0424  PROT 6.6  ALBUMIN  2.9*  AST 21  ALT 8  ALKPHOS 75  BILITOT 0.5   PT/INR No results for input(s): LABPROT, INR in the last 72 hours.  STUDIES: CT ABDOMEN PELVIS WO CONTRAST Result Date: 10/21/2024 EXAM: CT ABDOMEN AND PELVIS WITHOUT CONTRAST 10/21/2024 05:58:39 AM TECHNIQUE: CT of the abdomen and pelvis was performed without the administration of intravenous contrast. Multiplanar reformatted  images are provided for review. Automated exposure control, iterative reconstruction, and/or weight-based adjustment of the mA/kV was utilized to reduce the radiation dose to as low as reasonably achievable. COMPARISON: 10/19/2024 CLINICAL HISTORY: Foreign body, colon; chicken bone with colonic perforation. FINDINGS: LOWER CHEST: Patchy ground glass opacity and minimal consolidation in both lung bases, without significant change. Stable small hiatal hernia. LIVER: The liver is unremarkable. GALLBLADDER AND BILE DUCTS: Gallbladder is unremarkable. No biliary ductal dilatation. SPLEEN: No acute abnormality. PANCREAS: No acute abnormality. ADRENAL GLANDS: No acute abnormality. KIDNEYS, URETERS AND BLADDER: No stones in the kidneys or ureters. No hydronephrosis. No perinephric or periureteral stranding. Urinary bladder is unremarkable. GI AND BOWEL: Stomach demonstrates no acute abnormality. A thin curvilinear radiodense foreign body measuring approximately 6 cm in length is again seen in the proximal sigmoid  colon, unchanged in position since the previous study. This shows transmural perforation with associated mild colonic wall thickening and soft tissue stranding and pericolonic fat. No evidence of abnormal fluid collections. No evidence of bowel obstruction. Sigmoid diverticulosis again noted. PERITONEUM AND RETROPERITONEUM: No ascites. No free air. VASCULATURE: Aorta is normal in caliber. LYMPH NODES: No lymphadenopathy. REPRODUCTIVE ORGANS: No acute abnormality. BONES AND SOFT TISSUES: No acute osseous abnormality. No focal soft tissue abnormality. IMPRESSION: 1. Thin curvilinear radiodense foreign body in the proximal sigmoid colon with transmural perforation, unchanged in position. No evidence of abscess or free intraperitoneal air. 2. Sigmoid diverticulosis. 3. Small hiatal hernia, stable. Electronically signed by: Norleen Kil MD 10/21/2024 06:15 AM EDT RP Workstation: [id number]   CT ABDOMEN PELVIS WO CONTRAST Result Date: 10/19/2024 EXAM: CT ABDOMEN AND PELVIS WITHOUT CONTRAST 10/19/2024 01:52:54 PM TECHNIQUE: CT of the abdomen and pelvis was performed without the administration of intravenous contrast. Multiplanar reformatted images are provided for review. Automated exposure control, iterative reconstruction, and/or weight-based adjustment of the mA/kV was utilized to reduce the radiation dose to as low as reasonably achievable. COMPARISON: CT abdomen and pelvis dated 10/18/2024. CLINICAL HISTORY: Diverticulitis, complication suspected; Follow-up with passing of the foreign body. F/u foreign body. FINDINGS: LOWER CHEST: Moderate patchy ground glass opacity and minimal patchy consolidation at both lung bases, slightly improved. LIVER: The liver is unremarkable. GALLBLADDER AND BILE DUCTS: Gallbladder is unremarkable. No biliary ductal dilatation. SPLEEN: No acute abnormality. PANCREAS: No acute abnormality. ADRENAL GLANDS: No acute abnormality. KIDNEYS, URETERS AND BLADDER: No stones in the kidneys or  ureters. No hydronephrosis. No perinephric or periureteral stranding. Urinary bladder is unremarkable. GI AND BOWEL: Small hiatal hernia, unchanged. Otherwise normal nondistended stomach. No change in the position of the hyperdense curvilinear 6 cm apparent foreign body in the proximal sigmoid colon with apparent perforation of the sigmoid colon wall superiorly and inferiorly with associated stable wall thickening and surrounding fat stranding. No pericolonic free air or measurable fluid collection. Moderate underlying sigmoid diverticulosis. No dilated or thick walled small bowel loops. Normal appendix. PERITONEUM AND RETROPERITONEUM: No ascites. No free air. VASCULATURE: Aorta is normal in caliber. LYMPH NODES: No lymphadenopathy. REPRODUCTIVE ORGANS: No acute abnormality. BONES AND SOFT TISSUES: Moderate lumbar spondylosis. No acute osseous abnormality. No focal soft tissue abnormality. IMPRESSION: 1. Stable CT compared to yesterday. Unchanged position of hyperdense 6 cm curvilinear apparent foreign body in the proximal sigmoid colon with apparent mural perforations, associated wall thickening, and surrounding fat stranding; no pericolonic free air or measurable fluid collection. Electronically signed by: Selinda Blue MD 10/19/2024 02:36 PM EDT RP Workstation: [id number]      Impression/plan   Foreign body in colon Last  colonoscopy 2009 CT abdomen pelvis on admission with foreign body perforating sigmoid colon at both ends with moderate surrounding inflammatory change, thought to be chicken bone, surgery feels there is no suspicion of associated perforation with normal physical exam.  No complaints of abdominal pain, nausea, vomiting.  Repeat CT abdomen pelvis shows unchanged position of hyperdense 6 cm curvilinear abdomen foreign body in the proximal sigmoid colon with apparent mural perforations and associated wall thickening and fat stranding after completing half a gallon of GoLytely  Resolution of  leukocytosis Hgb 9.5 (possibly hemodilution) since iron studies are normal - Colonoscopy tomorrow - Complete the rest of the GoLytely  today - Clear liquid diet - N.p.o. 10 AM 10/29  Multifocal pneumonia Noted on CT abdomen pelvis On IV vancomycin  and IV Zosyn   History of atrial flutter/paroxysmal A-fib  History of chronic diastolic heart failure  COPD  CAD  History of seizure On Lamictal   Thank you for your kind consultation, we will continue to follow.   Bayley CHRISTELLA Blower  10/21/2024, 9:36 AM     Attending physician's note   I have taken history, reviewed the chart and examined the patient. I performed a substantive portion of this encounter, including complete performance of at least one of the key components, in conjunction with the APP. I agree with the Advanced Practitioner's note, impression and recommendations.   6 cm FB (?chicken bone) in Px sigmoid colon with surrounding inflammatory change.  No free perforation. No peritoneal signs. Had   elevated WBC count but better with A/Bs.  Plan: -Pt only took 1/4 prep yesterday. Finish today for colon tomorrow -Higher than normal  risk for perforation.  Appreciate surgery standing by. -Continue antibiotics. -Trend CBC, CMP.  -Low threshold for repeating CT after colonoscopy.  Explained in detail to the patient risks and benefits including significant risk for perforation requiring laparotomy, colostomy.  The benefits were also discussed.  He does understand that other option would be to undergo surgery. Pt would want to try to get it removed endoscopically and is willing to take the chance.   [name] Bring, MD [facility] [phone]

## 2024-10-21 NOTE — Evaluation (Signed)
 Physical Therapy Evaluation Patient Details Name: [name] [name] MRN: [id number] DOB: July 15, 1951 Today's Date: 10/21/2024  History of Present Illness  Patient is a 73 year old male who presented to the ED with c/o abdominal discomfort, presyncope, and N/V.  Dx with sepsis in setting of acute diverticulitis and multifocal PNA.  Colonoscopy scheduled for 10/29.  PMHx includes DM II, HTN, HLD, CAD (cath), A-fib, sick sinus syndrome, CHF, syncope, asthma, OSA, BPH, UTI  Clinical Impression  Pt admitted with above diagnosis. Pt reports from home with 75yo godson, ind at baseline without AD, lives in a townhome where he uses the backdoor entrance due to it being level and he parks his car closer to the backdoor. On eval, pt completes transfers with RW and CGA, amb in hallway with RW and CGA. Pt using IV pole for in room ambulation, noted to have increased lateral weight shifting without LOB. Pt denies pain or shortness of breath throughout evaluation. Pt hopeful to have colonoscopy tomorrow then d/c home. Recommend HHPT; may progress to no f/u pending progress in acute care. Pt currently with functional limitations due to the deficits listed below (see PT Problem List). Pt will benefit from acute skilled PT to increase their independence and safety with mobility to allow discharge.           If plan is discharge home, recommend the following: A little help with walking and/or transfers;A little help with bathing/dressing/bathroom;Assistance with cooking/housework;Assist for transportation;Help with stairs or ramp for entrance   Can travel by private vehicle        Equipment Recommendations None recommended by PT  Recommendations for Other Services       Functional Status Assessment Patient has had a recent decline in their functional status and demonstrates the ability to make significant improvements in function in a reasonable and predictable amount of time.     Precautions / Restrictions  Precautions Precautions: Fall Restrictions Weight Bearing Restrictions Per Provider Order: No      Mobility  Bed Mobility               General bed mobility comments: in recliner upon arrival    Transfers Overall transfer level: Needs assistance Equipment used: Rolling walker (2 wheels) Transfers: Sit to/from Stand Sit to Stand: Contact guard assist           General transfer comment: verbal cues for hand placement, slow to power up    Ambulation/Gait Ambulation/Gait assistance: Contact guard assist Gait Distance (Feet): 150 Feet Assistive device: Rolling walker (2 wheels), IV Pole Gait Pattern/deviations: Step-through pattern, Decreased stride length Gait velocity: decreased     General Gait Details: slow cadence with step through gait pattern, conversational throughout without complaints of pain or SOB, somewhat wide BOS, increased time with turns and navigating around obstcles; in room pt amb with IV pole, noted to have even slower cadence with increased lateral weight shifting, assistance to maneuver IV pole over threshold into restroom  Stairs            Wheelchair Mobility     Tilt Bed    Modified Rankin (Stroke Patients Only)       Balance Overall balance assessment: Needs assistance         Standing balance support: During functional activity Standing balance-Leahy Scale: Fair Standing balance comment: static able to release BUE from device to wash hands, dynamic using RW or IV pole  Pertinent Vitals/Pain Pain Assessment Pain Assessment: No/denies pain    Home Living Family/patient expects to be discharged to:: Private residence Living Arrangements: Other (Comment) (75 yo godson) Available Help at Discharge: Family;Available PRN/intermittently Type of Home:  (townhome) Home Access: Stairs to enter;Level entry Entrance Stairs-Rails: Right;Left Entrance Stairs-Number of Steps: 4-5 with bil  handrial at front door; back door is level entry so pt uses that door and parks near it   Home Layout: One level Home Equipment: [employer] (2 wheels);Cane - single point;Electric scooter Additional Comments: pt reports RW and SPC in the closet    Prior Function Prior Level of Function : Independent/Modified Independent;Driving             Mobility Comments: pt reports ind without AD ADLs Comments: pt reports ind with self care and household chores     Extremity/Trunk Assessment   Upper Extremity Assessment Upper Extremity Assessment: Defer to OT evaluation    Lower Extremity Assessment Lower Extremity Assessment: Overall WFL for tasks assessed (AROM WFL, strength grossly 3+/5, denies numbness/tingling throughout)    Cervical / Trunk Assessment Cervical / Trunk Assessment: Normal  Communication   Communication Communication: No apparent difficulties    Cognition Arousal: Alert Behavior During Therapy: WFL for tasks assessed/performed   PT - Cognitive impairments: No apparent impairments                         Following commands: Intact       Cueing       General Comments      Exercises     Assessment/Plan    PT Assessment Patient needs continued PT services  PT Problem List Decreased strength;Decreased activity tolerance;Decreased balance;Decreased mobility       PT Treatment Interventions DME instruction;Gait training;Stair training;Functional mobility training;Therapeutic activities;Therapeutic exercise;Balance training;Cognitive remediation;Patient/family education    PT Goals (Current goals can be found in the Care Plan section)  Acute Rehab PT Goals Patient Stated Goal: get this colonoscopy over with PT Goal Formulation: With patient Time For Goal Achievement: 11/04/24 Potential to Achieve Goals: Good    Frequency Min 3X/week     Co-evaluation               AM-PAC PT 6 Clicks Mobility  Outcome Measure Help  needed turning from your back to your side while in a flat bed without using bedrails?: A Little Help needed moving from lying on your back to sitting on the side of a flat bed without using bedrails?: A Little Help needed moving to and from a bed to a chair (including a wheelchair)?: A Little Help needed standing up from a chair using your arms (e.g., wheelchair or bedside chair)?: A Little Help needed to walk in hospital room?: A Little Help needed climbing 3-5 steps with a railing? : A Lot 6 Click Score: 17    End of Session Equipment Utilized During Treatment: Gait belt Activity Tolerance: Patient tolerated treatment well Patient left: in chair;with call bell/phone within reach;Other (comment) (OT in room) Nurse Communication: Mobility status PT Visit Diagnosis: Other abnormalities of gait and mobility (R26.89);Unsteadiness on feet (R26.81)    Time: 1110-1140 PT Time Calculation (min) (ACUTE ONLY): 30 min   Charges:   PT Evaluation $PT Eval Moderate Complexity: 1 Mod PT Treatments $Gait Training: 8-22 mins PT General Charges $$ ACUTE PT VISIT: 1 Visit          Tori Larz Mark PT, DPT 10/21/24, 1:23 PM

## 2024-10-22 ENCOUNTER — Encounter (HOSPITAL_COMMUNITY): Admission: EM | Disposition: A | Payer: Self-pay | Source: Home / Self Care | Attending: Internal Medicine

## 2024-10-22 ENCOUNTER — Encounter (HOSPITAL_COMMUNITY): Payer: Self-pay | Admitting: Internal Medicine

## 2024-10-22 ENCOUNTER — Inpatient Hospital Stay (HOSPITAL_COMMUNITY): Admitting: Anesthesiology

## 2024-10-22 ENCOUNTER — Encounter (HOSPITAL_COMMUNITY): Admitting: Anesthesiology

## 2024-10-22 DIAGNOSIS — K5792 Diverticulitis of intestine, part unspecified, without perforation or abscess without bleeding: Secondary | ICD-10-CM | POA: Diagnosis not present

## 2024-10-22 DIAGNOSIS — I2510 Atherosclerotic heart disease of native coronary artery without angina pectoris: Secondary | ICD-10-CM | POA: Diagnosis not present

## 2024-10-22 DIAGNOSIS — K640 First degree hemorrhoids: Secondary | ICD-10-CM

## 2024-10-22 DIAGNOSIS — K5730 Diverticulosis of large intestine without perforation or abscess without bleeding: Secondary | ICD-10-CM | POA: Diagnosis not present

## 2024-10-22 DIAGNOSIS — K633 Ulcer of intestine: Secondary | ICD-10-CM | POA: Diagnosis not present

## 2024-10-22 DIAGNOSIS — T184XXA Foreign body in colon, initial encounter: Secondary | ICD-10-CM | POA: Diagnosis not present

## 2024-10-22 HISTORY — PX: FLEXIBLE SIGMOIDOSCOPY: SHX5431

## 2024-10-22 HISTORY — PX: FOREIGN BODY REMOVAL: SHX962

## 2024-10-22 LAB — CBC WITH DIFFERENTIAL/PLATELET
Abs Immature Granulocytes: 0.04 K/uL (ref 0.00–0.07)
Basophils Absolute: 0.1 K/uL (ref 0.0–0.1)
Basophils Relative: 1 %
Eosinophils Absolute: 0.5 K/uL (ref 0.0–0.5)
Eosinophils Relative: 7 %
HCT: 31.9 % — ABNORMAL LOW (ref 39.0–52.0)
Hemoglobin: 9.3 g/dL — ABNORMAL LOW (ref 13.0–17.0)
Immature Granulocytes: 1 %
Lymphocytes Relative: 14 %
Lymphs Abs: 1.1 K/uL (ref 0.7–4.0)
MCH: 21.2 pg — ABNORMAL LOW (ref 26.0–34.0)
MCHC: 29.2 g/dL — ABNORMAL LOW (ref 30.0–36.0)
MCV: 72.7 fL — ABNORMAL LOW (ref 80.0–100.0)
Monocytes Absolute: 0.9 K/uL (ref 0.1–1.0)
Monocytes Relative: 12 %
Neutro Abs: 4.9 K/uL (ref 1.7–7.7)
Neutrophils Relative %: 65 %
Platelets: 298 K/uL (ref 150–400)
RBC: 4.39 MIL/uL (ref 4.22–5.81)
RDW: 16.1 % — ABNORMAL HIGH (ref 11.5–15.5)
WBC: 7.4 K/uL (ref 4.0–10.5)
nRBC: 0 % (ref 0.0–0.2)

## 2024-10-22 LAB — BASIC METABOLIC PANEL WITH GFR
Anion gap: 9 (ref 5–15)
BUN: 6 mg/dL — ABNORMAL LOW (ref 8–23)
CO2: 25 mmol/L (ref 22–32)
Calcium: 8.7 mg/dL — ABNORMAL LOW (ref 8.9–10.3)
Chloride: 106 mmol/L (ref 98–111)
Creatinine, Ser: 1.53 mg/dL — ABNORMAL HIGH (ref 0.61–1.24)
GFR, Estimated: 47 mL/min — ABNORMAL LOW (ref >60)
Glucose, Bld: 88 mg/dL (ref 70–99)
Potassium: 4 mmol/L (ref 3.5–5.1)
Sodium: 139 mmol/L (ref 135–145)

## 2024-10-22 SURGERY — SIGMOIDOSCOPY, FLEXIBLE
Anesthesia: Monitor Anesthesia Care

## 2024-10-22 MED ORDER — EPHEDRINE SULFATE-NACL 50-0.9 MG/10ML-% IV SOSY
PREFILLED_SYRINGE | INTRAVENOUS | Status: DC | PRN
  Administered 2024-10-22: 10 mg via INTRAVENOUS

## 2024-10-22 MED ORDER — LIDOCAINE 2% (20 MG/ML) 5 ML SYRINGE
INTRAMUSCULAR | Status: DC | PRN
  Administered 2024-10-22: 40 mg via INTRAVENOUS

## 2024-10-22 MED ORDER — PROPOFOL 500 MG/50ML IV EMUL
INTRAVENOUS | Status: DC | PRN
  Administered 2024-10-22: 180 ug/kg/min via INTRAVENOUS

## 2024-10-22 MED ORDER — PROPOFOL 10 MG/ML IV BOLUS
INTRAVENOUS | Status: DC | PRN
  Administered 2024-10-22: 20 mg via INTRAVENOUS

## 2024-10-22 NOTE — Op Note (Signed)
 [facility] Patient Name: [name] [name] Procedure Date: 10/22/2024 MRN: [id number] Attending MD: Lynnie Bring , MD, [id number] Date of Birth: 1951/02/09 CSN: [id number] Age: 73 Admit Type: Inpatient Procedure:                Flexible Sigmoidoscopy Indications:              Abnormal CT of the GI tract with sharp pointed 6 cm                            foreign body in the sigmoid with surrounding                            inflammation. Surgery is standby. For endoscopic                            removal. Providers:                Lynnie Bring, MD, Ozell Pouch, Curtistine Bishop, Technician Referring MD:              Medicines:                Propofol  per Anesthesia, None Complications:            No immediate complications. Estimated Blood Loss:     Estimated blood loss: none. No immediate                            complications Procedure:                Pre-Anesthesia Assessment:                           - Prior to the procedure, a History and Physical                            was performed, and patient medications and                            allergies were reviewed. The patient's tolerance of                            previous anesthesia was also reviewed. The risks                            and benefits of the procedure and the sedation                            options and risks were discussed with the patient.                            All questions were answered, and informed consent                            was obtained. Prior Anticoagulants:  The patient has                            taken no anticoagulant or antiplatelet agents. ASA                            Grade Assessment: II - A patient with mild systemic                            disease. After reviewing the risks and benefits,                            the patient was deemed in satisfactory condition to                            undergo the procedure.                            After obtaining informed consent, the scope was                            passed under direct vision. The [id number]-[id number]                            ([id number]) olympus colonscope was introduced through                            the anus and advanced to the the sigmoid colon.                            After obtaining informed consent, the scope was                            passed under direct vision.The flexible                            sigmoidoscopy was accomplished without difficulty.                            The patient tolerated the procedure well. The                            quality of the bowel preparation was adequate to                            identify polyps greater than 5 mm in size. Scope In: 2:28:07 PM Scope Out: 2:44:30 PM Total Procedure Duration: 0 hours 16 minutes 23 seconds  Findings:      A foreign body (sharp object likely chicken bone) was found in the       proximal sigmoid colon (at 30 cm) in a transverse orientation embedding       into the mucosa on both sides. This was broken down into 2 pieces using       a rat-tooth forceps and then each piece retrieved separately also using  rat-tooth forceps by orienting sharp foreign body along the lumen.       Residual small ulcers were noted without any remaining foreign body.       Remaining colonic mucosa did not incur any damage. Verification of       patient identification for the specimen was done by the physician, nurse       and technician. It did measure 6 cm. Estimated blood loss: none. No       immediate complications      Many medium-mouthed diverticula were found in the sigmoid colon.      Non-bleeding internal hemorrhoids were found during retroflexion. The       hemorrhoids were moderate and Grade I (internal hemorrhoids that do not       prolapse). Impression:               - Foreign body (likely chicken bone) in the                            proximal sigmoid colon. Removal  was successful.                           - Diverticulosis in the sigmoid colon.                           - Non-bleeding internal hemorrhoids. Moderate Sedation:      Not Applicable - Patient had care per Anesthesia. Recommendation:           - Return patient to hospital ward for ongoing care.                           - Clear liquid diet.                           - Continue antibiotics x 48 hrs                           - Repeat CT Abdo/pelvis for any perforation. If                            neg, can advance diet.                           - Appreciate surgery standing by.                           - I do recommend colonoscopy as an outpatient in 8                            to 12 weeks with a 2-day prep. Procedure Code(s):        --- Professional ---                           [phone], Sigmoidoscopy, flexible; with removal of                            foreign body(s) Diagnosis Code(s):        ---  Professional ---                           U81.5KKJ, Foreign body in colon, initial encounter                           K64.0, First degree hemorrhoids                           K57.30, Diverticulosis of large intestine without                            perforation or abscess without bleeding                           R93.3, Abnormal findings on diagnostic imaging of                            other parts of digestive tract CPT copyright 2022 [employer]. All rights reserved. The codes documented in this report are preliminary and upon coder review may  be revised to meet current compliance requirements. Lynnie Bring, MD 10/22/2024 3:02:48 PM This report has been signed electronically. Number of Addenda: 0

## 2024-10-22 NOTE — Interval H&P Note (Signed)
 History and Physical Interval Note:  10/22/2024 2:12 PM  [name] [name]  has presented today for surgery, with the diagnosis of foreign object removal, abdominal pain.  The various methods of treatment have been discussed with the patient and family. After consideration of risks, benefits and other options for treatment, the patient has consented to  Procedure(s): COLONOSCOPY (N/A) as a surgical intervention.  The patient's history has been reviewed, patient examined, no change in status, stable for surgery.  I have reviewed the patient's chart and labs.  Questions were answered to the patient's satisfaction.     Lynnie Bring

## 2024-10-22 NOTE — Progress Notes (Signed)
 PROGRESS NOTE    [name] [name]  FMW:[id number] DOB: Jul 31, 1951 DOA: 10/18/2024 PCP: [name], MD    Brief Narrative: 73 year old gentleman history of paroxysmal A-fib, a flutter, chronic diastolic heart failure, CAD status post stent placement, SSS, COPD, GERD, seizure, BPH, hypogonadism, gout, chronic tinnitus and vertigo, NIDDM type II presented to the ED with episode of nausea vomiting abdominal discomfort and presyncopal episode.  Patient seen in the ED noted to be febrile with a temp of 102, tachypneic otherwise hemodynamically stable.  Lab work concerning for leukocytosis.  CT abdomen and pelvis done concerning for intraluminal foreign body with concerns for colonic perforation and moderate surrounding inflammatory changes as well as diverticulosis, patchy bibasilar airspace infiltrates consistent with acute multifocal infection.  CT head unremarkable.  Patient placed on empiric IV antibiotics.  General surgery consulted.  Patient underwent repeat CT scans of the abdomen and pelvis with unchanged position of foreign body, GI consulted.  Patient for colonoscopy 10/22/2024.    Assessment & Plan:   Principal Problem:   Acute diverticulitis Active Problems:   Multifocal pneumonia   Foreign body alimentary tract-questionable   Paroxysmal atrial fibrillation (HCC)   Chronic vertigo   Sick sinus syndrome (HCC)   COPD (chronic obstructive pulmonary disease) (HCC)   Syncope and collapse   Chronic diastolic CHF (congestive heart failure) (HCC)   History of CAD (coronary artery disease)   History of seizure   Non-insulin dependent type 2 diabetes mellitus (HCC)   #1 sepsis in the setting of probable acute diverticulitis and multifocal pneumonia/foreign body in sigmoid colon  - Patient on admission had presented with sudden onset abdominal pain, nausea vomiting presyncopal episode. - Patient on admission noted to be tachypneic, noted to have a leukocytosis, afebrile with a temp of  102. - Patient with slightly elevated lactic acid level that has improved. -- CT abdomen pelvis showing Wire-like metallic intraluminal foreign body (~6 cm) perforating the sigmoid colon at both ends with moderate surrounding inflammatory change. No free intraperitoneal gas or fluid, no pericolonic fluid collection, and no bowel obstruction. Surgical consultation recommended.  Moderate descending and sigmoid diverticulosis.  - Patient with no acute abdominal signs or symptoms on examination without any rebound or guarding or abdominal rigidity. - Abdominal exam benign. - Per admitting physician EDP discussed with general surgery who felt there is no suspicion of foreign body or associated perforation as patient with no signs of perforation on examination and recommended treatment for diverticulitis with serial abdominal exams. - General Surgery noted to have evaluated patient on admission with concern for  possible chicken bone foreign body in the sigmoid colon.  Recommended to keep n.p.o. and bowel rest.  IV antibiotics Zosyn .  Serial exam. Most likely will consider repeating CAT scan to see if the foreign body has passed. If not or patient worse, may require operative resection and Hartmann resection if he truly has perforated his colon. There is no need for emergency surgery tonight but surgery will help follow.  - Repeat CT abdomen and pelvis done with stable CT, unchanged position of hyperdense 6 cm curvilinear abdomen foreign body in the proximal sigmoid colon with apparent mural perforations, associated wall thickening, surrounding fat stranding, no pericolonic free air or measurable free fluid. -- Patient seen by general surgery and patient with a benign abdominal exam.   - Patient placed on clear liquids and GoLytely  bowel prep ordered and patient underwent repeat CT 10/21/2024 with thin curvilinear radiodense foreign body in the proximal sigmoid colon with  transmural perforation, unchanged in  position.  No evidence of abscess or free intraperitoneal air.  Sigmoid diverticulosis.  Small hiatal hernia. - Patient seen in consultation by GI and patient being scheduled for colonoscopy  10/22/2024.  -Continue empiric IV antibiotics. -General surgery feels patient is at risk for progressing to frank perforation at any point which would lead to surgery for resection of the segment of colon. - GI and General Surgery following and appreciate the input and recommendations.    2.  Multifocal pneumonia patient remains on room air with no complaints of cough or shortness of breath -Noted on CT abdomen and pelvis. - Patient noted to have met criteria for sepsis with leukocytosis, tachypnea, fever. - MRSA PCR detected.   - Blood cultures with no growth to date.   - Continue IV Zosyn .   - Continue Pulmicort , nebs as needed.   - Supportive care.    3.  History of atrial flutter/paroxysmal A-fib -Currently normal sinus rhythm. - Continue digoxin , Toprol -XL 25 mg daily for rate control. - Patient noted to have been prescribed amiodarone  however per admitting physician per chart review and in discussion with pharmacist patient currently is not on amiodarone  we will hold off on reinitiating amiodarone  at this time. - It is noted that amiodarone  had not been refilled over the past 6 months. - Patient noted to not be a anticoagulation candidate due to history of recurrent falls from chronic tinnitus and vertigo. - Continue aspirin  81 mg daily.   4.  History of SSS -Outpatient follow-up.   5.  Chronic diastolic heart failure - Continue Toprol -XL, digoxin .   - Lasix  and losartan on hold.    6.  History of COPD -Stable. - DuoNebs as needed.   7.  History of CAD -Stable - Toprol -XL.     8.  GERD - Continue IV PPI.   9.  History of seizure - Continue Lamictal .     10.  Non-insulin-dependent diabetes mellitus type 2 -Hemoglobin A1c noted at 5.9 (02/01/2024) - Repeat hemoglobin A1c  5.3. -Noted to be on Jardiance  prior to admission which we will continue to hold in house. - Outpatient follow-up.    Estimated body mass index is 36.81 kg/m as calculated from the following:   Height as of this encounter: 5' 7 (1.702 m).   Weight as of this encounter: 106.6 kg.  DVT prophylaxis: scd Code Status: full Family Communication:none Disposition Plan:  Status is: Inpatient    Consultants: gi and general surgery  Procedures: Colonoscopy to be done on 10/22/2024 Antimicrobials:  Zosyn   Subjective: Resting in bed in no acute distress denies any abdominal pain has been having bowel movements with the GoLytely  waiting for colonoscopy to be done today he is n.p.o.  Objective: Vitals:   10/22/24 0251 10/22/24 0520 10/22/24 0800 10/22/24 0833  BP: (!) 125/59 126/67    Pulse: (!) 59 (!) 58    Resp: 20 17 (!) 25   Temp: 97.8 F (36.6 C) 98.4 F (36.9 C)    TempSrc: Oral     SpO2: 100% 100%  100%  Weight:      Height:        Intake/Output Summary (Last 24 hours) at 10/22/2024 0942 Last data filed at 10/22/2024 0800 Gross per 24 hour  Intake 1524.6 ml  Output 1750 ml  Net -225.4 ml   Filed Weights   10/18/24 1742  Weight: 106.6 kg    Examination:  General exam: Appears calm and comfortable  Respiratory system:  Clear to auscultation. Respiratory effort normal. Cardiovascular system: S1 & S2 heard, RRR. No JVD, murmurs, rubs, gallops or clicks. No pedal edema. Gastrointestinal system: Abdomen is nondistended, soft and nontender. No organomegaly or masses felt. Normal bowel sounds heard. Central nervous system: Alert and oriented. No focal neurological deficits. Extremities: Symmetric 5 x 5 power.  Data Reviewed: I have personally reviewed following labs and imaging studies  CBC: Recent Labs  Lab 10/18/24 2006 10/19/24 0424 10/20/24 1002 10/21/24 0741 10/22/24 0748  WBC 15.2* 24.8* 15.3* 9.8 7.4  NEUTROABS 13.2*  --   --   --  4.9  HGB 12.1* 9.7*  11.0* 9.5* 9.3*  HCT 42.2 31.9* 36.6* 31.6* 31.9*  MCV 73.9* 73.8* 71.2* 74.4* 72.7*  PLT 395 306 264 280 298   Basic Metabolic Panel: Recent Labs  Lab 10/18/24 2006 10/19/24 0424 10/20/24 1002 10/21/24 0741 10/22/24 0748  NA 142 139 136 137 139  K 4.0 4.1 4.6 3.8 4.0  CL 104 105 104 104 106  CO2 25 24 20* 24 25  GLUCOSE 81 105* 89 83 88  BUN 13 15 15 11  6*  CREATININE 1.40* 1.51* 1.48* 1.62* 1.53*  CALCIUM  9.4 8.6* 8.9 8.9 8.7*  MG 2.1  --   --   --   --    GFR: Estimated Creatinine Clearance: 48.6 mL/min (A) (by C-G formula based on SCr of 1.53 mg/dL (H)). Liver Function Tests: Recent Labs  Lab 10/18/24 2006 10/19/24 0424  AST 28 21  ALT 13 8  ALKPHOS 98 75  BILITOT 0.5 0.5  PROT 8.6* 6.6  ALBUMIN  3.7 2.9*   Recent Labs  Lab 10/18/24 2006  LIPASE 33   No results for input(s): AMMONIA in the last 168 hours. Coagulation Profile: No results for input(s): INR, PROTIME in the last 168 hours. Cardiac Enzymes: No results for input(s): CKTOTAL, CKMB, CKMBINDEX, TROPONINI in the last 168 hours. BNP (last 3 results) No results for input(s): PROBNP in the last 8760 hours. HbA1C: Recent Labs    10/20/24 1002  HGBA1C 5.3   CBG: No results for input(s): GLUCAP in the last 168 hours. Lipid Profile: No results for input(s): CHOL, HDL, LDLCALC, TRIG, CHOLHDL, LDLDIRECT in the last 72 hours. Thyroid  Function Tests: No results for input(s): TSH, T4TOTAL, FREET4, T3FREE, THYROIDAB in the last 72 hours. Anemia Panel: Recent Labs    10/19/24 1021  VITAMINB12 330  FOLATE 8.4  FERRITIN 110  TIBC 203*  IRON 47   Sepsis Labs: Recent Labs  Lab 10/18/24 2357 10/19/24 0143 10/19/24 0424 10/19/24 0735 10/19/24 1021  PROCALCITON 0.18  --   --   --   --   LATICACIDVEN  --  2.2* 2.0* 2.1* 1.2    Recent Results (from the past 240 hours)  Resp panel by RT-PCR (RSV, Flu A&B, Covid) Anterior Nasal Swab     Status: None    Collection Time: 10/18/24 10:51 PM   Specimen: Anterior Nasal Swab  Result Value Ref Range Status   SARS Coronavirus 2 by RT PCR NEGATIVE NEGATIVE Final    Comment: (NOTE) SARS-CoV-2 target nucleic acids are NOT DETECTED.  The SARS-CoV-2 RNA is generally detectable in upper respiratory specimens during the acute phase of infection. The lowest concentration of SARS-CoV-2 viral copies this assay can detect is 138 copies/mL. A negative result does not preclude SARS-Cov-2 infection and should not be used as the sole basis for treatment or other patient management decisions. A negative result may occur with  improper  specimen collection/handling, submission of specimen other than nasopharyngeal swab, presence of viral mutation(s) within the areas targeted by this assay, and inadequate number of viral copies(<138 copies/mL). A negative result must be combined with clinical observations, patient history, and epidemiological information. The expected result is Negative.  Fact Sheet for Patients:  bloggercourse.com  Fact Sheet for Healthcare Providers:  seriousbroker.it  This test is no t yet approved or cleared by the [place]  FDA and  has been authorized for detection and/or diagnosis of SARS-CoV-2 by FDA under an Emergency Use Authorization (EUA). This EUA will remain  in effect (meaning this test can be used) for the duration of the COVID-19 declaration under Section 564(b)(1) of the Act, 21 U.S.C.section 360bbb-3(b)(1), unless the authorization is terminated  or revoked sooner.       Influenza A by PCR NEGATIVE NEGATIVE Final   Influenza B by PCR NEGATIVE NEGATIVE Final    Comment: (NOTE) The Xpert Xpress SARS-CoV-2/FLU/RSV plus assay is intended as an aid in the diagnosis of influenza from Nasopharyngeal swab specimens and should not be used as a sole basis for treatment. Nasal washings and aspirates are unacceptable for  Xpert Xpress SARS-CoV-2/FLU/RSV testing.  Fact Sheet for Patients: bloggercourse.com  Fact Sheet for Healthcare Providers: seriousbroker.it  This test is not yet approved or cleared by the [place]  FDA and has been authorized for detection and/or diagnosis of SARS-CoV-2 by FDA under an Emergency Use Authorization (EUA). This EUA will remain in effect (meaning this test can be used) for the duration of the COVID-19 declaration under Section 564(b)(1) of the Act, 21 U.S.C. section 360bbb-3(b)(1), unless the authorization is terminated or revoked.     Resp Syncytial Virus by PCR NEGATIVE NEGATIVE Final    Comment: (NOTE) Fact Sheet for Patients: bloggercourse.com  Fact Sheet for Healthcare Providers: seriousbroker.it  This test is not yet approved or cleared by the [place]  FDA and has been authorized for detection and/or diagnosis of SARS-CoV-2 by FDA under an Emergency Use Authorization (EUA). This EUA will remain in effect (meaning this test can be used) for the duration of the COVID-19 declaration under Section 564(b)(1) of the Act, 21 U.S.C. section 360bbb-3(b)(1), unless the authorization is terminated or revoked.  Performed at [facility], [address], [place], [place] [address]   Culture, blood (Routine X 2) w Reflex to ID Panel     Status: None (Preliminary result)   Collection Time: 10/18/24 11:40 PM   Specimen: BLOOD  Result Value Ref Range Status   Specimen Description   Final    BLOOD RIGHT ANTECUBITAL Performed at [facility], [address] [place], [place] [address]    Special Requests   Final    BOTTLES DRAWN AEROBIC AND ANAEROBIC Blood Culture results may not be optimal due to an inadequate volume of blood received in culture bottles Performed at [facility], [address],  [place], [place] [address]    Culture   Final    NO GROWTH 3 DAYS Performed at [facility], [address] [place], [place] [address]    Report Status PENDING  Incomplete  Respiratory (~20 pathogens) panel by PCR     Status: None   Collection Time: 10/18/24 11:58 PM   Specimen: Nasopharyngeal Swab; Respiratory  Result Value Ref Range Status   Adenovirus NOT DETECTED NOT DETECTED Final   Coronavirus 229E NOT DETECTED NOT DETECTED Final    Comment: (NOTE) The Coronavirus on the Respiratory  Panel, DOES NOT test for the novel  Coronavirus (2019 nCoV)    Coronavirus HKU1 NOT DETECTED NOT DETECTED Final   Coronavirus NL63 NOT DETECTED NOT DETECTED Final   Coronavirus OC43 NOT DETECTED NOT DETECTED Final   Metapneumovirus NOT DETECTED NOT DETECTED Final   Rhinovirus / Enterovirus NOT DETECTED NOT DETECTED Final   Influenza A NOT DETECTED NOT DETECTED Final   Influenza B NOT DETECTED NOT DETECTED Final   Parainfluenza Virus 1 NOT DETECTED NOT DETECTED Final   Parainfluenza Virus 2 NOT DETECTED NOT DETECTED Final   Parainfluenza Virus 3 NOT DETECTED NOT DETECTED Final   Parainfluenza Virus 4 NOT DETECTED NOT DETECTED Final   Respiratory Syncytial Virus NOT DETECTED NOT DETECTED Final   Bordetella pertussis NOT DETECTED NOT DETECTED Final   Bordetella Parapertussis NOT DETECTED NOT DETECTED Final   Chlamydophila pneumoniae NOT DETECTED NOT DETECTED Final   Mycoplasma pneumoniae NOT DETECTED NOT DETECTED Final    Comment: Performed at [facility], [address] [place], [place] [address]  Culture, blood (Routine X 2) w Reflex to ID Panel     Status: None (Preliminary result)   Collection Time: 10/19/24  1:43 AM   Specimen: BLOOD  Result Value Ref Range Status   Specimen Description   Final    BLOOD BLOOD RIGHT HAND Performed at [facility], [address] [place], [place] [address]    Special Requests   Final    BOTTLES DRAWN AEROBIC AND ANAEROBIC Blood  Culture adequate volume Performed at [facility], [address], [place], [place] [address]    Culture   Final    NO GROWTH 3 DAYS Performed at [facility], [address] [place], [place] [address]    Report Status PENDING  Incomplete  MRSA Next Gen by PCR, Nasal     Status: Abnormal   Collection Time: 10/20/24  3:08 PM   Specimen: Nasal Mucosa; Nasal Swab  Result Value Ref Range Status   MRSA by PCR Next Gen DETECTED (A) NOT DETECTED Final    Comment: (NOTE) The GeneXpert MRSA Assay (FDA approved for NASAL specimens only), is one component of a comprehensive MRSA colonization surveillance program. It is not intended to diagnose MRSA infection nor to guide or monitor treatment for MRSA infections. Test performance is not FDA approved in patients less than 9 years old. Performed at [facility], [address] [place], [place] [address]          Radiology Studies: CT ABDOMEN PELVIS WO CONTRAST Result Date: 10/21/2024 EXAM: CT ABDOMEN AND PELVIS WITHOUT CONTRAST 10/21/2024 05:58:39 AM TECHNIQUE: CT of the abdomen and pelvis was performed without the administration of intravenous contrast. Multiplanar reformatted images are provided for review. Automated exposure control, iterative reconstruction, and/or weight-based adjustment of the mA/kV was utilized to reduce the radiation dose to as low as reasonably achievable. COMPARISON: 10/19/2024 CLINICAL HISTORY: Foreign body, colon; chicken bone with colonic perforation. FINDINGS: LOWER CHEST: Patchy ground glass opacity and minimal consolidation in both lung bases, without significant change. Stable small hiatal hernia. LIVER: The liver is unremarkable. GALLBLADDER AND BILE DUCTS: Gallbladder is unremarkable. No biliary ductal dilatation. SPLEEN: No acute abnormality. PANCREAS: No acute abnormality. ADRENAL GLANDS: No acute abnormality. KIDNEYS, URETERS AND BLADDER: No stones in the kidneys  or ureters. No hydronephrosis. No perinephric or periureteral stranding. Urinary bladder is unremarkable. GI AND BOWEL: Stomach demonstrates no acute abnormality. A thin curvilinear radiodense foreign body measuring  approximately 6 cm in length is again seen in the proximal sigmoid colon, unchanged in position since the previous study. This shows transmural perforation with associated mild colonic wall thickening and soft tissue stranding and pericolonic fat. No evidence of abnormal fluid collections. No evidence of bowel obstruction. Sigmoid diverticulosis again noted. PERITONEUM AND RETROPERITONEUM: No ascites. No free air. VASCULATURE: Aorta is normal in caliber. LYMPH NODES: No lymphadenopathy. REPRODUCTIVE ORGANS: No acute abnormality. BONES AND SOFT TISSUES: No acute osseous abnormality. No focal soft tissue abnormality. IMPRESSION: 1. Thin curvilinear radiodense foreign body in the proximal sigmoid colon with transmural perforation, unchanged in position. No evidence of abscess or free intraperitoneal air. 2. Sigmoid diverticulosis. 3. Small hiatal hernia, stable. Electronically signed by: Norleen Kil MD 10/21/2024 06:15 AM EDT RP Workstation: [id number]    Scheduled Meds:  atorvastatin   40 mg Oral Daily   budesonide  (PULMICORT ) nebulizer solution  0.5 mg Nebulization BID   Chlorhexidine  Gluconate Cloth  6 each Topical Daily   digoxin   0.125 mg Oral Daily   ferrous sulfate   325 mg Oral Q breakfast   lamoTRIgine   100 mg Oral BID   metoprolol  succinate  25 mg Oral Daily   mupirocin  ointment  1 Application Nasal BID   pantoprazole  (PROTONIX ) IV  40 mg Intravenous Q24H   sodium chloride  flush  3 mL Intravenous Q12H   tamsulosin   0.4 mg Oral Daily   Continuous Infusions:  sodium chloride  Stopped (10/22/24 9376)   piperacillin -tazobactam (ZOSYN )  IV 12.5 mL/hr at 10/22/24 0800     LOS: 4 days    Almarie KANDICE Hoots, MD 10/22/2024, 9:42 AM

## 2024-10-22 NOTE — TOC Initial Note (Signed)
 Transition of Care [facility]) - Initial/Assessment Note    Patient Details  Name: [name] [name] MRN: [id number] Date of Birth: 03/21/1951  Transition of Care [facility]) CM/SW Contact:    Tawni CHRISTELLA Eva, LCSW Phone Number: 10/22/2024, 3:28 PM  Clinical Narrative:                  Attempted to completed assessment x2 pt in procedure will follow up. Care management to follow.        Patient Goals and CMS Choice            Expected Discharge Plan and Services                                              Prior Living Arrangements/Services                       Activities of Daily Living   ADL Screening (condition at time of admission) Independently performs ADLs?: Yes (appropriate for developmental age) Is the patient deaf or have difficulty hearing?: Yes Does the patient have difficulty seeing, even when wearing glasses/contacts?: No Does the patient have difficulty concentrating, remembering, or making decisions?: No  Permission Sought/Granted                  Emotional Assessment              Admission diagnosis:  Fever in adult [R50.9] Foreign body in colon, initial encounter [T18.4XXA] Acute diverticulitis [K57.92] Bilious vomiting with nausea [R11.14] Community acquired pneumonia, unspecified laterality [J18.9] Sepsis without acute organ dysfunction, due to unspecified organism [facility]) [A41.9] Patient Active Problem List   Diagnosis Date Noted   Acute diverticulitis 10/18/2024   Foreign body alimentary tract-questionable 10/18/2024   Chronic diastolic CHF (congestive heart failure) (HCC) 10/18/2024   History of CAD (coronary artery disease) 10/18/2024   History of seizure 10/18/2024   Non-insulin dependent type 2 diabetes mellitus (HCC) 10/18/2024   Morbid obesity, unspecified obesity type (HCC) 07/10/2024   Seasonal allergic rhinitis due to pollen 03/13/2024   Sensorineural hearing loss (SNHL) of right ear with unrestricted  hearing of left ear 03/13/2024   Dysfunction of both eustachian tubes 03/13/2024   Asymmetric SNHL (sensorineural hearing loss) 11/30/2022   Alteration consciousness 03/08/2022   Tinnitus 03/08/2022   Gout 09/18/2018   Chronic left shoulder pain 07/03/2018   Syncope and collapse 12/01/2017   Dislocated shoulder 12/01/2017   UTI (urinary tract infection) 12/01/2017   Elevated sed rate 12/26/2016   History of gastroesophageal reflux (GERD) 12/22/2016   History of asthma 12/22/2016   History of COPD 12/22/2016   BPH with obstruction/lower urinary tract symptoms 03/21/2016   COPD (chronic obstructive pulmonary disease) (HCC) 12/24/2015   Multifocal pneumonia 12/15/2015   Anemia 11/27/2015   Hypertensive heart disease 11/27/2015   Sepsis (HCC) 11/27/2015   Gynecomastia 03/03/2015   Seizures (HCC) 10/09/2014   Sick sinus syndrome (HCC) 03/21/2013   TINEA CRURIS 09/30/2010   Hypogonadism, male 09/23/2010   GERD 09/01/2010   Chronic vertigo 09/01/2010   Obstructive sleep apnea 08/20/2010   Other fatigue 06/28/2010   Abnormal involuntary movement 06/28/2010   Vitamin D  deficiency 03/25/2010   ERECTILE DYSFUNCTION 11/26/2008   Essential hypertension, benign 11/26/2008   FOLATE-DEFICIENCY ANEMIA 09/24/2008   IRON DEFIC ANEMIA SEC DIET IRON INTAKE 09/21/2008   Paroxysmal atrial fibrillation (HCC)  09/21/2008   Asthma 09/21/2008   ESOPHAGEAL MOTILITY DISORDER 08/26/2008   PCP:  [name], MD Pharmacy:   [facility] [phone] [address], [place] - [address] [place] [address] Phone: [phone] Fax: [phone]     Social Drivers of Health (SDOH) Social History: SDOH Screenings   Food Insecurity: No Food Insecurity (10/19/2024)  Housing: Low Risk  (10/19/2024)  Transportation Needs: No Transportation Needs (10/19/2024)  Utilities: Not At Risk (10/19/2024)  Alcohol Screen: Low Risk  (07/10/2024)  Depression (PHQ2-9): Low Risk   (07/10/2024)  Financial Resource Strain: Low Risk  (07/10/2024)  Recent Concern: Financial Resource Strain - Medium Risk (07/06/2024)  Physical Activity: Insufficiently Active (07/06/2024)  Social Connections: Moderately Integrated (10/19/2024)  Stress: No Stress Concern Present (07/10/2024)  Tobacco Use: Low Risk  (10/22/2024)  Health Literacy: Adequate Health Literacy (07/10/2024)   SDOH Interventions:     Readmission Risk Interventions     No data to display

## 2024-10-22 NOTE — Plan of Care (Signed)

## 2024-10-22 NOTE — Progress Notes (Signed)
 Mobility Specialist - Progress Note   10/22/24 0851  Mobility  Activity Ambulated with assistance  Level of Assistance Contact guard assist, steadying assist  Assistive Device Front wheel walker  Distance Ambulated (ft) 180 ft  Range of Motion/Exercises Active  Activity Response Tolerated well  Mobility Referral Yes  Mobility visit 1 Mobility  Mobility Specialist Start Time (ACUTE ONLY) 0830  Mobility Specialist Stop Time (ACUTE ONLY) 0850  Mobility Specialist Time Calculation (min) (ACUTE ONLY) 20 min   Pt was found in bed and agreeable to mobilize. C/o gout on RLE. At EOS returned to recliner chair with all needs met. Call bell in reach.   [name] [name],  Mobility Specialist Can be reached via Secure Chat

## 2024-10-22 NOTE — Transfer of Care (Signed)
 Immediate Anesthesia Transfer of Care Note  Patient: [name] [name]  Procedure(s) Performed: SIGMOIDOSCOPY, FLEXIBLE REMOVAL, FOREIGN BODY  Patient Location: Endoscopy Unit  Anesthesia Type:MAC  Level of Consciousness: sedated  Airway & Oxygen  Therapy: Patient Spontanous Breathing and Patient connected to face mask oxygen   Post-op Assessment: Report given to RN and Post -op Vital signs reviewed and stable  Post vital signs: Reviewed and stable  Last Vitals:  Vitals Value Taken Time  BP    Temp    Pulse    Resp    SpO2      Last Pain:  Vitals:   10/22/24 1357  TempSrc: Temporal  PainSc: 0-No pain         Complications: No notable events documented.

## 2024-10-23 ENCOUNTER — Inpatient Hospital Stay (HOSPITAL_COMMUNITY)

## 2024-10-23 ENCOUNTER — Telehealth: Payer: Self-pay | Admitting: Nurse Practitioner

## 2024-10-23 DIAGNOSIS — Z9889 Other specified postprocedural states: Secondary | ICD-10-CM

## 2024-10-23 DIAGNOSIS — K5792 Diverticulitis of intestine, part unspecified, without perforation or abscess without bleeding: Secondary | ICD-10-CM | POA: Diagnosis not present

## 2024-10-23 DIAGNOSIS — T184XXA Foreign body in colon, initial encounter: Secondary | ICD-10-CM | POA: Diagnosis not present

## 2024-10-23 LAB — BASIC METABOLIC PANEL WITH GFR
Anion gap: 11 (ref 5–15)
BUN: <5 mg/dL — ABNORMAL LOW (ref 8–23)
CO2: 22 mmol/L (ref 22–32)
Calcium: 8.8 mg/dL — ABNORMAL LOW (ref 8.9–10.3)
Chloride: 104 mmol/L (ref 98–111)
Creatinine, Ser: 1.43 mg/dL — ABNORMAL HIGH (ref 0.61–1.24)
GFR, Estimated: 51 mL/min — ABNORMAL LOW (ref >60)
Glucose, Bld: 86 mg/dL (ref 70–99)
Potassium: 3.8 mmol/L (ref 3.5–5.1)
Sodium: 138 mmol/L (ref 135–145)

## 2024-10-23 LAB — CBC
HCT: 33.4 % — ABNORMAL LOW (ref 39.0–52.0)
Hemoglobin: 10.1 g/dL — ABNORMAL LOW (ref 13.0–17.0)
MCH: 22.2 pg — ABNORMAL LOW (ref 26.0–34.0)
MCHC: 30.2 g/dL (ref 30.0–36.0)
MCV: 73.4 fL — ABNORMAL LOW (ref 80.0–100.0)
Platelets: 300 K/uL (ref 150–400)
RBC: 4.55 MIL/uL (ref 4.22–5.81)
RDW: 16.3 % — ABNORMAL HIGH (ref 11.5–15.5)
WBC: 7.5 K/uL (ref 4.0–10.5)
nRBC: 0 % (ref 0.0–0.2)

## 2024-10-23 MED ORDER — IOHEXOL 9 MG/ML PO SOLN
ORAL | Status: AC
  Filled 2024-10-23: qty 1000

## 2024-10-23 NOTE — Plan of Care (Signed)
  Problem: Clinical Measurements: Goal: Ability to maintain clinical measurements within normal limits will improve Outcome: Progressing Goal: Diagnostic test results will improve Outcome: Progressing   Problem: Activity: Goal: Risk for activity intolerance will decrease Outcome: Progressing   Problem: Elimination: Goal: Will not experience complications related to bowel motility Outcome: Progressing Goal: Will not experience complications related to urinary retention Outcome: Progressing   Problem: Activity: Goal: Ability to tolerate increased activity will improve Outcome: Progressing   Problem: Clinical Measurements: Goal: Ability to maintain a body temperature in the normal range will improve Outcome: Progressing

## 2024-10-23 NOTE — Progress Notes (Signed)
 Mobility Specialist - Progress Note   10/23/24 1443  Mobility  Activity Ambulated with assistance  Level of Assistance Standby assist, set-up cues, supervision of patient - no hands on  Assistive Device Front wheel walker  Distance Ambulated (ft) 300 ft  Range of Motion/Exercises Active  Activity Response Tolerated well  Mobility Referral Yes  Mobility visit 1 Mobility  Mobility Specialist Start Time (ACUTE ONLY) 1430  Mobility Specialist Stop Time (ACUTE ONLY) 1443  Mobility Specialist Time Calculation (min) (ACUTE ONLY) 13 min   Pt was found on recliner chair and agreeable to mobilize. No complaints. At EOS returned to recliner chair with all needs met. Call bell in reach and family in room.   Erminio Leos,  Mobility Specialist Can be reached via Secure Chat

## 2024-10-23 NOTE — Progress Notes (Signed)
 PROGRESS NOTE    [name] [name]  FMW:[id number] DOB: Apr 11, 1951 DOA: 10/18/2024 PCP: [name], MD    Brief Narrative: 73 year old gentleman history of paroxysmal A-fib, a flutter, chronic diastolic heart failure, CAD status post stent placement, SSS, COPD, GERD, seizure, BPH, hypogonadism, gout, chronic tinnitus and vertigo, NIDDM type II presented to the ED with episode of nausea vomiting abdominal discomfort and presyncopal episode.  Patient seen in the ED noted to be febrile with a temp of 102, tachypneic otherwise hemodynamically stable.  Lab work concerning for leukocytosis.  CT abdomen and pelvis done concerning for intraluminal foreign body with concerns for colonic perforation and moderate surrounding inflammatory changes as well as diverticulosis, patchy bibasilar airspace infiltrates consistent with acute multifocal infection.  CT head unremarkable.  Patient placed on empiric IV antibiotics.  General surgery consulted.  Patient underwent repeat CT scans of the abdomen and pelvis with unchanged position of foreign body, GI consulted.  Patient for colonoscopy 10/22/2024.    Assessment & Plan:   Principal Problem:   Acute diverticulitis Active Problems:   Multifocal pneumonia   Foreign body alimentary tract-questionable   Paroxysmal atrial fibrillation (HCC)   Chronic vertigo   Sick sinus syndrome (HCC)   COPD (chronic obstructive pulmonary disease) (HCC)   Syncope and collapse   Chronic diastolic CHF (congestive heart failure) (HCC)   History of CAD (coronary artery disease)   History of seizure   Non-insulin dependent type 2 diabetes mellitus (HCC)   #1 sepsis in the setting of probable acute diverticulitis and multifocal pneumonia/foreign body in sigmoid colon  - Patient on admission had presented with sudden onset abdominal pain, nausea vomiting presyncopal episode. - Patient on admission noted to be tachypneic, noted to have a leukocytosis, afebrile with a temp of  102. - Patient with slightly elevated lactic acid level that has improved. -- CT abdomen pelvis showing Wire-like metallic intraluminal foreign body (~6 cm) perforating the sigmoid colon at both ends with moderate surrounding inflammatory change. No free intraperitoneal gas or fluid, no pericolonic fluid collection, and no bowel obstruction. Surgical consultation recommended.  Moderate descending and sigmoid diverticulosis.  - Patient with no acute abdominal signs or symptoms on examination without any rebound or guarding or abdominal rigidity. - Abdominal exam benign. - Per admitting physician EDP discussed with general surgery who felt there is no suspicion of foreign body or associated perforation as patient with no signs of perforation on examination and recommended treatment for diverticulitis with serial abdominal exams. - General Surgery noted to have evaluated patient on admission with concern for  possible chicken bone foreign body in the sigmoid colon.  Recommended to keep n.p.o. and bowel rest.  IV antibiotics Zosyn .  Serial exam. Most likely will consider repeating CAT scan to see if the foreign body has passed. If not or patient worse, may require operative resection and Hartmann resection if he truly has perforated his colon. There is no need for emergency surgery tonight but surgery will help follow.  - Repeat CT abdomen and pelvis done with stable CT, unchanged position of hyperdense 6 cm curvilinear abdomen foreign body in the proximal sigmoid colon with apparent mural perforations, associated wall thickening, surrounding fat stranding, no pericolonic free air or measurable free fluid. Repeat CT scan 10/18/2024 shows no evidence of foreign body patient feels improved and better will start him on a soft diet. -- Patient seen by general surgery and patient with a benign abdominal exam.   - Patient placed on clear liquids and  GoLytely  bowel prep ordered and patient underwent repeat CT  10/21/2024 with thin curvilinear radiodense foreign body in the proximal sigmoid colon with transmural perforation, unchanged in position.  No evidence of abscess or free intraperitoneal air.  Sigmoid diverticulosis.  Small hiatal hernia. - Patient seen in consultation by GI and patient being scheduled for colonoscopy  10/22/2024.  -Continue empiric IV antibiotics. -General surgery feels patient is at risk for progressing to frank perforation at any point which would lead to surgery for resection of the segment of colon.   2.  Multifocal pneumonia patient remains on room air with no complaints of cough or shortness of breath -Noted on CT abdomen and pelvis. - Patient noted to have met criteria for sepsis with leukocytosis, tachypnea, fever. - MRSA PCR detected.   - Blood cultures with no growth to date.   - Continue IV Zosyn .   - Continue Pulmicort , nebs as needed.   - Supportive care.    3.  History of atrial flutter/paroxysmal A-fib -Currently normal sinus rhythm. - Continue digoxin , Toprol -XL 25 mg daily for rate control. - Patient noted to have been prescribed amiodarone  however per admitting physician per chart review and in discussion with pharmacist patient currently is not on amiodarone  we will hold off on reinitiating amiodarone  at this time. - It is noted that amiodarone  had not been refilled over the past 6 months. - Patient noted to not be a anticoagulation candidate due to history of recurrent falls from chronic tinnitus and vertigo. - Continue aspirin  81 mg daily.   4.  History of SSS -Outpatient follow-up.   5.  Chronic diastolic heart failure - Continue Toprol -XL, digoxin .   - Lasix  and losartan on hold.    6.  History of COPD -Stable. - DuoNebs as needed.   7.  History of CAD -Stable - Toprol -XL.     8.  GERD - Continue IV PPI.   9.  History of seizure - Continue Lamictal .     10.  Non-insulin-dependent diabetes mellitus type 2 -Hemoglobin A1c noted at 5.9  (02/01/2024) - Repeat hemoglobin A1c 5.3. -Noted to be on Jardiance  prior to admission which we will continue to hold in house. - Outpatient follow-up.    Estimated body mass index is 36.81 kg/m as calculated from the following:   Height as of this encounter: 5' 7 (1.702 m).   Weight as of this encounter: 106.6 kg.  DVT prophylaxis: scd Code Status: full Family Communication:none Disposition Plan:  Status is: Inpatient    Consultants: gi and general surgery  Procedures: Colonoscopy to be done on 10/22/2024 Antimicrobials:  Zosyn   Subjective: Anxious to eat some food advance diet to soft diet  Objective: Vitals:   10/23/24 0910 10/23/24 1042 10/23/24 1044 10/23/24 1047  BP:   103/60 115/60  Pulse:  (!) 59 (!) 57 60  Resp:      Temp:      TempSrc:      SpO2: 97%     Weight:      Height:        Intake/Output Summary (Last 24 hours) at 10/23/2024 1237 Last data filed at 10/23/2024 0900 Gross per 24 hour  Intake 183.67 ml  Output 800 ml  Net -616.33 ml   Filed Weights   10/18/24 1742  Weight: 106.6 kg    Examination:  General exam: Appears calm and comfortable  Respiratory system: Clear to auscultation. Respiratory effort normal. Cardiovascular system: S1 & S2 heard, RRR. No JVD, murmurs, rubs, gallops  or clicks. No pedal edema. Gastrointestinal system: Abdomen is nondistended, soft and nontender. No organomegaly or masses felt. Normal bowel sounds heard. Central nervous system: Alert and oriented. No focal neurological deficits. Extremities: Symmetric 5 x 5 power.  Data Reviewed: I have personally reviewed following labs and imaging studies  CBC: Recent Labs  Lab 10/18/24 2006 10/19/24 0424 10/20/24 1002 10/21/24 0741 10/22/24 0748 10/23/24 0906  WBC 15.2* 24.8* 15.3* 9.8 7.4 7.5  NEUTROABS 13.2*  --   --   --  4.9  --   HGB 12.1* 9.7* 11.0* 9.5* 9.3* 10.1*  HCT 42.2 31.9* 36.6* 31.6* 31.9* 33.4*  MCV 73.9* 73.8* 71.2* 74.4* 72.7* 73.4*  PLT 395  306 264 280 298 300   Basic Metabolic Panel: Recent Labs  Lab 10/18/24 2006 10/19/24 0424 10/20/24 1002 10/21/24 0741 10/22/24 0748 10/23/24 0906  NA 142 139 136 137 139 138  K 4.0 4.1 4.6 3.8 4.0 3.8  CL 104 105 104 104 106 104  CO2 25 24 20* 24 25 22   GLUCOSE 81 105* 89 83 88 86  BUN 13 15 15 11  6* <5*  CREATININE 1.40* 1.51* 1.48* 1.62* 1.53* 1.43*  CALCIUM  9.4 8.6* 8.9 8.9 8.7* 8.8*  MG 2.1  --   --   --   --   --    GFR: Estimated Creatinine Clearance: 52 mL/min (A) (by C-G formula based on SCr of 1.43 mg/dL (H)). Liver Function Tests: Recent Labs  Lab 10/18/24 2006 10/19/24 0424  AST 28 21  ALT 13 8  ALKPHOS 98 75  BILITOT 0.5 0.5  PROT 8.6* 6.6  ALBUMIN  3.7 2.9*   Recent Labs  Lab 10/18/24 2006  LIPASE 33   No results for input(s): AMMONIA in the last 168 hours. Coagulation Profile: No results for input(s): INR, PROTIME in the last 168 hours. Cardiac Enzymes: No results for input(s): CKTOTAL, CKMB, CKMBINDEX, TROPONINI in the last 168 hours. BNP (last 3 results) No results for input(s): PROBNP in the last 8760 hours. HbA1C: No results for input(s): HGBA1C in the last 72 hours.  CBG: No results for input(s): GLUCAP in the last 168 hours. Lipid Profile: No results for input(s): CHOL, HDL, LDLCALC, TRIG, CHOLHDL, LDLDIRECT in the last 72 hours. Thyroid  Function Tests: No results for input(s): TSH, T4TOTAL, FREET4, T3FREE, THYROIDAB in the last 72 hours. Anemia Panel: No results for input(s): VITAMINB12, FOLATE, FERRITIN, TIBC, IRON, RETICCTPCT in the last 72 hours.  Sepsis Labs: Recent Labs  Lab 10/18/24 2357 10/19/24 0143 10/19/24 0424 10/19/24 0735 10/19/24 1021  PROCALCITON 0.18  --   --   --   --   LATICACIDVEN  --  2.2* 2.0* 2.1* 1.2    Recent Results (from the past 240 hours)  Resp panel by RT-PCR (RSV, Flu A&B, Covid) Anterior Nasal Swab     Status: None   Collection Time:  10/18/24 10:51 PM   Specimen: Anterior Nasal Swab  Result Value Ref Range Status   SARS Coronavirus 2 by RT PCR NEGATIVE NEGATIVE Final    Comment: (NOTE) SARS-CoV-2 target nucleic acids are NOT DETECTED.  The SARS-CoV-2 RNA is generally detectable in upper respiratory specimens during the acute phase of infection. The lowest concentration of SARS-CoV-2 viral copies this assay can detect is 138 copies/mL. A negative result does not preclude SARS-Cov-2 infection and should not be used as the sole basis for treatment or other patient management decisions. A negative result may occur with  improper specimen collection/handling, submission of  specimen other than nasopharyngeal swab, presence of viral mutation(s) within the areas targeted by this assay, and inadequate number of viral copies(<138 copies/mL). A negative result must be combined with clinical observations, patient history, and epidemiological information. The expected result is Negative.  Fact Sheet for Patients:  bloggercourse.com  Fact Sheet for Healthcare Providers:  seriousbroker.it  This test is no t yet approved or cleared by the [place]  FDA and  has been authorized for detection and/or diagnosis of SARS-CoV-2 by FDA under an Emergency Use Authorization (EUA). This EUA will remain  in effect (meaning this test can be used) for the duration of the COVID-19 declaration under Section 564(b)(1) of the Act, 21 U.S.C.section 360bbb-3(b)(1), unless the authorization is terminated  or revoked sooner.       Influenza A by PCR NEGATIVE NEGATIVE Final   Influenza B by PCR NEGATIVE NEGATIVE Final    Comment: (NOTE) The Xpert Xpress SARS-CoV-2/FLU/RSV plus assay is intended as an aid in the diagnosis of influenza from Nasopharyngeal swab specimens and should not be used as a sole basis for treatment. Nasal washings and aspirates are unacceptable for Xpert Xpress  SARS-CoV-2/FLU/RSV testing.  Fact Sheet for Patients: bloggercourse.com  Fact Sheet for Healthcare Providers: seriousbroker.it  This test is not yet approved or cleared by the [place]  FDA and has been authorized for detection and/or diagnosis of SARS-CoV-2 by FDA under an Emergency Use Authorization (EUA). This EUA will remain in effect (meaning this test can be used) for the duration of the COVID-19 declaration under Section 564(b)(1) of the Act, 21 U.S.C. section 360bbb-3(b)(1), unless the authorization is terminated or revoked.     Resp Syncytial Virus by PCR NEGATIVE NEGATIVE Final    Comment: (NOTE) Fact Sheet for Patients: bloggercourse.com  Fact Sheet for Healthcare Providers: seriousbroker.it  This test is not yet approved or cleared by the [place]  FDA and has been authorized for detection and/or diagnosis of SARS-CoV-2 by FDA under an Emergency Use Authorization (EUA). This EUA will remain in effect (meaning this test can be used) for the duration of the COVID-19 declaration under Section 564(b)(1) of the Act, 21 U.S.C. section 360bbb-3(b)(1), unless the authorization is terminated or revoked.  Performed at [facility], [address] [place], [place] [address]   Culture, blood (Routine X 2) w Reflex to ID Panel     Status: None (Preliminary result)   Collection Time: 10/18/24 11:40 PM   Specimen: BLOOD  Result Value Ref Range Status   Specimen Description   Final    BLOOD RIGHT ANTECUBITAL Performed at [facility], [address] [place], [place] [address]    Special Requests   Final    BOTTLES DRAWN AEROBIC AND ANAEROBIC Blood Culture results may not be optimal due to an inadequate volume of blood received in culture bottles Performed at [facility], [address] [place], [place]  [address]    Culture   Final    NO GROWTH 4 DAYS Performed at [facility], [address] [place], [place] [address]    Report Status PENDING  Incomplete  Respiratory (~20 pathogens) panel by PCR     Status: None   Collection Time: 10/18/24 11:58 PM   Specimen: Nasopharyngeal Swab; Respiratory  Result Value Ref Range Status   Adenovirus NOT DETECTED NOT DETECTED Final   Coronavirus 229E NOT DETECTED NOT DETECTED Final    Comment: (NOTE) The Coronavirus on the Respiratory Panel, DOES NOT test  for the novel  Coronavirus (2019 nCoV)    Coronavirus HKU1 NOT DETECTED NOT DETECTED Final   Coronavirus NL63 NOT DETECTED NOT DETECTED Final   Coronavirus OC43 NOT DETECTED NOT DETECTED Final   Metapneumovirus NOT DETECTED NOT DETECTED Final   Rhinovirus / Enterovirus NOT DETECTED NOT DETECTED Final   Influenza A NOT DETECTED NOT DETECTED Final   Influenza B NOT DETECTED NOT DETECTED Final   Parainfluenza Virus 1 NOT DETECTED NOT DETECTED Final   Parainfluenza Virus 2 NOT DETECTED NOT DETECTED Final   Parainfluenza Virus 3 NOT DETECTED NOT DETECTED Final   Parainfluenza Virus 4 NOT DETECTED NOT DETECTED Final   Respiratory Syncytial Virus NOT DETECTED NOT DETECTED Final   Bordetella pertussis NOT DETECTED NOT DETECTED Final   Bordetella Parapertussis NOT DETECTED NOT DETECTED Final   Chlamydophila pneumoniae NOT DETECTED NOT DETECTED Final   Mycoplasma pneumoniae NOT DETECTED NOT DETECTED Final    Comment: Performed at [facility], [address] [place], [place] [address]  Culture, blood (Routine X 2) w Reflex to ID Panel     Status: None (Preliminary result)   Collection Time: 10/19/24  1:43 AM   Specimen: BLOOD  Result Value Ref Range Status   Specimen Description   Final    BLOOD BLOOD RIGHT HAND Performed at [facility], [address], [place], [place] [address]    Special Requests   Final    BOTTLES DRAWN AEROBIC AND ANAEROBIC Blood Culture  adequate volume Performed at [facility], [address], [place], [place] [address]    Culture   Final    NO GROWTH 4 DAYS Performed at [facility], [address], [place], [place] [address]    Report Status PENDING  Incomplete  MRSA Next Gen by PCR, Nasal     Status: Abnormal   Collection Time: 10/20/24  3:08 PM   Specimen: Nasal Mucosa; Nasal Swab  Result Value Ref Range Status   MRSA by PCR Next Gen DETECTED (A) NOT DETECTED Final    Comment: (NOTE) The GeneXpert MRSA Assay (FDA approved for NASAL specimens only), is one component of a comprehensive MRSA colonization surveillance program. It is not intended to diagnose MRSA infection nor to guide or monitor treatment for MRSA infections. Test performance is not FDA approved in patients less than 61 years old. Performed at [facility], [address] [place], [place] [address]          Radiology Studies: CT ABDOMEN PELVIS WO CONTRAST Result Date: 10/23/2024 CLINICAL DATA:  Evaluate for perforation post flexible sigmoidoscopy. Chicken bone removed from left colon. EXAM: CT ABDOMEN AND PELVIS WITHOUT CONTRAST TECHNIQUE: Multidetector CT imaging of the abdomen and pelvis was performed following the standard protocol without IV contrast. RADIATION DOSE REDUCTION: This exam was performed according to the departmental dose-optimization program which includes automated exposure control, adjustment of the mA and/or kV according to patient size and/or use of iterative reconstruction technique. COMPARISON:  10/21/2024, 10/18/2024 FINDINGS: Lower chest: Heart is normal size. Possible small sliding hiatal hernia. Visualized lung bases are clear. Hepatobiliary: Liver, gallbladder and biliary tree are normal. Pancreas: Normal. Spleen: Normal. Adrenals/Urinary Tract: Adrenal glands are normal. Kidneys are normal in size without hydronephrosis or nephrolithiasis. Ureters and bladder are normal.  Stomach/Bowel: Mild distention of the stomach with fluid/contrast. Small bowel is unremarkable. Appendix is normal. Mild diverticulosis of the colon. Contrast present within the colon to the level of the sigmoid colon.  Previously seen foreign body/chin bone over the sigmoid colon in the left lower quadrant is no longer visualized and compatible with recent removal. Minimal density projects from the right lateral wall of the sigmoid colon in the left lower quadrant in the area the chicken bone had previously perforated. No current evidence of free air. No residual foreign body present. Remainder of the colon is unremarkable. Vascular/Lymphatic: Abdominal aorta is normal caliber. No adenopathy. Reproductive: Prostate is unremarkable. Other: Small amount of free fluid in the pelvis. Musculoskeletal: No focal abnormality. IMPRESSION: 1. No acute findings in the abdomen/pelvis. Previously seen foreign body/chicken bone over the sigmoid colon in the left lower quadrant is no longer visualized compatible with recent removal. No residual foreign body present. No free peritoneal air. 2. Mild diverticulosis of the colon. 3. Small amount of free fluid in the pelvis. Electronically Signed   By: Toribio Agreste M.D.   On: 10/23/2024 10:56    Scheduled Meds:  atorvastatin   40 mg Oral Daily   budesonide  (PULMICORT ) nebulizer solution  0.5 mg Nebulization BID   Chlorhexidine  Gluconate Cloth  6 each Topical Daily   digoxin   0.125 mg Oral Daily   ferrous sulfate   325 mg Oral Q breakfast   lamoTRIgine   100 mg Oral BID   metoprolol  succinate  25 mg Oral Daily   mupirocin  ointment  1 Application Nasal BID   pantoprazole  (PROTONIX ) IV  40 mg Intravenous Q24H   sodium chloride  flush  3 mL Intravenous Q12H   tamsulosin   0.4 mg Oral Daily   Continuous Infusions:  sodium chloride  20 mL/hr at 10/22/24 1407   piperacillin -tazobactam (ZOSYN )  IV 3.375 g (10/23/24 0517)     LOS: 5 days    Almarie KANDICE Hoots,  MD 10/23/2024, 12:37 PM

## 2024-10-23 NOTE — Telephone Encounter (Signed)
 [name] [name], patient will likely be discharged home from [facility] today or tomorrow.  Please contact the patient and schedule him for hospital follow-up appointment with Dr. Legrand or POD C APP, patient will require a colonoscopy in 8 to 12 weeks.  Patient will require a 2-day bowel prep. THX.

## 2024-10-23 NOTE — Progress Notes (Addendum)
 [facility] Progress Note  CC:  Foreign object in colon   Subjective: He is sitting up in the chair tolerating a clear liquid diet.  No abdominal pain.  He stated he never had any abdominal pain.  He passed 2 brown loose stools earlier this morning.  No bloody or black stools.  No chest pain or shortness of breath.  Transporter at bedside to take patient down for repeat CTAP.   Objective:   Flexible sigmoidoscopy 10/22/2024: - Foreign body (likely chicken bone) in the proximal sigmoid colon. Removal was successful.  - Diverticulosis in the sigmoid colon. - Non-bleeding internal hemorrhoids.  Vital signs in last 24 hours: Temp:  [97.7 F (36.5 C)-98.2 F (36.8 C)] 97.9 F (36.6 C) (10/30 0519) Pulse Rate:  [56-92] 92 (10/30 0519) Resp:  [10-25] 22 (10/29 1700) BP: (108-166)/(50-101) 108/79 (10/30 0519) SpO2:  [96 %-100 %] 97 % (10/30 0519) Last BM Date : 10/22/24 General: Alert 73 year old male in no acute distress. Heart: Regular rate and rhythm, no murmurs. Pulm: Breath sounds clear throughout.  On room air. Abdomen: Soft, nondistended.  Nontender.  Possible sounds to all 4 quadrants Extremities: Bilateral lower extremity edema. Neurologic:  Alert and oriented x 4. Grossly normal neurologically. Psych:  Alert and cooperative. Normal mood and affect.  Intake/Output from previous day: 10/29 0701 - 10/30 0700 In: 277.6 [I.V.:176.2; IV Piggyback:101.4] Out: 650 [Urine:650] Intake/Output this shift: No intake/output data recorded.  Lab Results: Recent Labs    10/20/24 1002 10/21/24 0741 10/22/24 0748  WBC 15.3* 9.8 7.4  HGB 11.0* 9.5* 9.3*  HCT 36.6* 31.6* 31.9*  PLT 264 280 298   BMET Recent Labs    10/20/24 1002 10/21/24 0741 10/22/24 0748  NA 136 137 139  K 4.6 3.8 4.0  CL 104 104 106  CO2 20* 24 25  GLUCOSE 89 83 88  BUN 15 11 6*  CREATININE 1.48* 1.62* 1.53*  CALCIUM  8.9 8.9 8.7*   LFT No results for input(s): PROT, ALBUMIN ,  AST, ALT, ALKPHOS, BILITOT, BILIDIR, IBILI in the last 72 hours. PT/INR No results for input(s): LABPROT, INR in the last 72 hours. Hepatitis Panel No results for input(s): HEPBSAG, HCVAB, HEPAIGM, HEPBIGM in the last 72 hours.  No results found.  Patient Profile:  [name] [name] is a 73 y.o. male with past medical history significant for paroxysmal A-fib, a flutter, chronic diastolic heart failure, CAD status post stent placement, SSS, COPD, GERD, seizure, BPH, hypogonadism, gout, chronic tinnitus, vertigo and DM type II admitted on 10/18/2024 with N/V, abdominal pain, near syncope and sepsis suspected due to multifocal pneumonia/foreign body in sigmoid colon.  Assessment / Plan:  CTAP without contrast on admission 10/18/2024 identified a foreign body perforating sigmoid colon at both ends with moderate surrounding inflammatory change, thought to be chicken a chicken bone.  General surgery consulted, no indication for surgical intervention, however, patient at risk for frank perforation at any point. On Zosyn  IV.   Repeat CTAP without contrast 10/26 unchanged position of hyperdense 6 cm curvilinear abdomen foreign body in the proximal sigmoid colon with apparent mural perforations and associated wall thickening and fat stranding after completing half a gallon of GoLytely .  Repeat CTAP without contrast 10/28 a thin curvilinear radiodense foreign body in the proximal sigmoid colon with transmural perforation, unchanged in position. No evidence of abscess or free intraperitoneal air and sigmoid diverticulosis.  Flexible sigmoidoscopy 10/29 confirmed a foreign body (likely chicken bone) in the proximal sigmoid colon which  was successfully removed. - CTAP without contrast to rule out perforation status post flexible sigmoidoscopy - If CTAP negative for perforation may advance diet as tolerated - Recommend outpatient colonoscopy with 2-day bowel prep in 8 to 12 weeks  AKI.  Admission Cr 1.40 -> 1.62 -> 1.53 -> today Cr 1.43. - BMP in am  Multifocal pneumonia per CTAP. On IV Vancomycin  and IV Zosyn .  On room air.  Macrocytic anemia. Admission Hg 12.1 -> 9.7 -> 11.0 -> 9.5 -. 9.3 -> today Hg 10.1.  No overt GI bleeding.  On Ferrous Sulfate  325 mg daily. - CBC, iron, ferritin and IBC in am   GERD -Continue Pantoprazole  40 mg IV daily   History of atrial flutter/paroxysmal A-fib   History of chronic diastolic heart failure  History of CAD.  No angina.   COPD  DM type II  Principal Problem:   Acute diverticulitis Active Problems:   Paroxysmal atrial fibrillation (HCC)   Chronic vertigo   Sick sinus syndrome (HCC)   Multifocal pneumonia   COPD (chronic obstructive pulmonary disease) (HCC)   Syncope and collapse   Foreign body alimentary tract-questionable   Chronic diastolic CHF (congestive heart failure) (HCC)   History of CAD (coronary artery disease)   History of seizure   Non-insulin dependent type 2 diabetes mellitus (HCC)     LOS: 5 days   Colleen M Kennedy-Smith  10/23/2024, 10:06 AM   Attending physician's note   I have taken history, reviewed the chart and examined the patient. I performed a substantive portion of this encounter, including complete performance of at least one of the key components, in conjunction with the APP. I agree with the Advanced Practitioner's note, impression and recommendations.   S/P FS with removal of sharp 6 cm FB (chicken bone) yesterday. No bleeding or perforation on CT today.  No abdominal pain  Plan: - Advance diet - OK to go home today from GI standpoint - Recommend outpt FU in GI clinic for full colon in 8-12 weeks (with 2 day prep). - Can D/C home on 2 more days of PO antibiotics - Will sign off for now. Colleen to arrange for follow-up   Anselm Bring, MD [facility] [phone]

## 2024-10-23 NOTE — Plan of Care (Signed)

## 2024-10-23 NOTE — Progress Notes (Signed)
 Physical Therapy Treatment Patient Details Name: [name] [name] MRN: [id number] DOB: 03-16-1951 Today's Date: 10/23/2024   History of Present Illness [name] [name] is a 73 y.o. male presented to the ED with episode of nausea vomiting abdominal discomfort and presyncopal episode. CT abdomen pelvis without contrast showed foreign body in proximal sigmoid colon with transmural perforation. Pt s/p flexible sigmoidoscopy 10/29. CT 10/30 No acute findings in the abdomen/pelvis. No residual foreign body present. PMH:  paroxysmal A-fib, a flutter, chronic diastolic heart failure, CAD status post stent placement, SSS, COPD, GERD, seizure, BPH, hypogonadism, gout, chronic tinnitus and vertigo, NIDDM type II    PT Comments  Pt pleasant, seated in recliner, agreeable to therapy. Pt amb in room to restroom, able to void bladder in standing and complete hand washing without UE support and supv for safety. Pt amb 250 ft in hallway with RW, decreased cadence, flat foot posture, slight increased time to navigate obstacles, conversational throughout without shortness of breath noted and no complaints. Pt reliant on AD with ambulation, reports no AD at baseline. Upon return to room, pt returns to recliner, hopeful for diet to be adjusted.    If plan is discharge home, recommend the following: A little help with walking and/or transfers;A little help with bathing/dressing/bathroom;Assistance with cooking/housework;Assist for transportation;Help with stairs or ramp for entrance   Can travel by private vehicle        Equipment Recommendations  None recommended by PT    Recommendations for Other Services       Precautions / Restrictions Precautions Precautions: Fall Recall of Precautions/Restrictions: Intact Restrictions Weight Bearing Restrictions Per Provider Order: No     Mobility  Bed Mobility               General bed mobility comments: in recliner upon arrival    Transfers Overall transfer  level: Needs assistance Equipment used: Rolling walker (2 wheels) Transfers: Sit to/from Stand Sit to Stand: Supervision           General transfer comment: cues for hand placement to power up and for controlled descent as pt plops into chair when returning to sitting    Ambulation/Gait Ambulation/Gait assistance: Supervision Gait Distance (Feet): 250 Feet Assistive device: Rollator (4 wheels) Gait Pattern/deviations: Step-through pattern, Decreased stride length Gait velocity: decreased     General Gait Details: step through gait pattern with decreased cadence, flat foot posture with decreased heel-toe pattern, able to navigate around obstacles and in room without LOB but needing increased time to navigate obstacles, reliant on RW for ambulation and unable to take steps without device   Stairs             Wheelchair Mobility     Tilt Bed    Modified Rankin (Stroke Patients Only)       Balance Overall balance assessment: Needs assistance         Standing balance support: During functional activity Standing balance-Leahy Scale: Fair Standing balance comment: fair static able to relase hands to wash; dynamic with RW                            Communication Communication Communication: No apparent difficulties  Cognition Arousal: Alert Behavior During Therapy: WFL for tasks assessed/performed   PT - Cognitive impairments: No apparent impairments                         Following commands: Intact  Cueing    Exercises      General Comments        Pertinent Vitals/Pain Pain Assessment Pain Assessment: No/denies pain    Home Living                          Prior Function            PT Goals (current goals can now be found in the care plan section) Acute Rehab PT Goals Patient Stated Goal: get this colonoscopy over with PT Goal Formulation: With patient Time For Goal Achievement: 11/04/24 Potential to  Achieve Goals: Good Progress towards PT goals: Progressing toward goals    Frequency    Min 3X/week      PT Plan      Co-evaluation              AM-PAC PT 6 Clicks Mobility   Outcome Measure  Help needed turning from your back to your side while in a flat bed without using bedrails?: A Little Help needed moving from lying on your back to sitting on the side of a flat bed without using bedrails?: A Little Help needed moving to and from a bed to a chair (including a wheelchair)?: A Little Help needed standing up from a chair using your arms (e.g., wheelchair or bedside chair)?: A Little Help needed to walk in hospital room?: A Little Help needed climbing 3-5 steps with a railing? : A Lot 6 Click Score: 17    End of Session Equipment Utilized During Treatment: Gait belt Activity Tolerance: Patient tolerated treatment well Patient left: in chair;with chair alarm set Nurse Communication: Mobility status PT Visit Diagnosis: Other abnormalities of gait and mobility (R26.89);Unsteadiness on feet (R26.81)     Time: 8881-8864 PT Time Calculation (min) (ACUTE ONLY): 17 min  Charges:    $Gait Training: 8-22 mins PT General Charges $$ ACUTE PT VISIT: 1 Visit                     Tori Anddy Wingert PT, DPT 10/23/24, 11:51 AM

## 2024-10-24 DIAGNOSIS — K5792 Diverticulitis of intestine, part unspecified, without perforation or abscess without bleeding: Secondary | ICD-10-CM | POA: Diagnosis not present

## 2024-10-24 LAB — CULTURE, BLOOD (ROUTINE X 2)
Culture: NO GROWTH
Culture: NO GROWTH
Special Requests: ADEQUATE

## 2024-10-24 LAB — GLUCOSE, CAPILLARY: Glucose-Capillary: 96 mg/dL (ref 70–99)

## 2024-10-24 MED ORDER — PANTOPRAZOLE SODIUM 40 MG PO TBEC: 40.0000 mg | DELAYED_RELEASE_TABLET | Freq: Every day | ORAL | Status: DC

## 2024-10-24 NOTE — TOC Transition Note (Signed)
 Transition of Care [facility]) - Discharge Note   Patient Details  Name: [name] [name] MRN: [id number] Date of Birth: [birth year]  [facility]) CM/SW Contact:  Tawni CHRISTELLA Eva, LCSW Phone Number: 10/24/2024, 8:51 AM   Clinical Narrative:    Pt no longer recommended for home health services. No further ICM needs at this time , care management sign off.    Final next level of care: Home/Self Care Barriers to Discharge: Barriers Resolved   Patient Goals and CMS Choice Patient states their goals for this hospitalization and ongoing recovery are:: retrun home          Discharge Placement                       Discharge Plan and Services Additional resources added to the After Visit Summary for                                       Social Drivers of Health (SDOH) Interventions SDOH Screenings   Food Insecurity: No Food Insecurity (10/19/2024)  Housing: Low Risk  (10/19/2024)  Transportation Needs: No Transportation Needs (10/19/2024)  Utilities: Not At Risk (10/19/2024)  Alcohol Screen: Low Risk  (07/10/2024)  Depression (PHQ2-9): Low Risk  (07/10/2024)  Financial Resource Strain: Low Risk  (07/10/2024)  Recent Concern: Financial Resource Strain - Medium Risk (07/06/2024)  Physical Activity: Insufficiently Active (07/06/2024)  Social Connections: Moderately Integrated (10/19/2024)  Stress: No Stress Concern Present (07/10/2024)  Tobacco Use: Low Risk  (10/22/2024)  Health Literacy: Adequate Health Literacy (07/10/2024)     Readmission Risk Interventions     No data to display

## 2024-10-24 NOTE — Plan of Care (Signed)
  Problem: Clinical Measurements: Goal: Ability to maintain clinical measurements within normal limits will improve Outcome: Progressing   Problem: Activity: Goal: Risk for activity intolerance will decrease Outcome: Progressing   Problem: Nutrition: Goal: Adequate nutrition will be maintained Outcome: Progressing   Problem: Safety: Goal: Ability to remain free from injury will improve Outcome: Progressing   Problem: Clinical Measurements: Goal: Ability to maintain a body temperature in the normal range will improve Outcome: Progressing

## 2024-10-24 NOTE — Care Management Important Message (Signed)
 Important Message  Patient Details IM Letter given. Name: LINDWOOD MOGEL MRN: [id number] Date of Birth: 08-14-1951   Important Message Given:  Yes - Medicare IM     Melba Ates 10/24/2024, 9:02 AM

## 2024-10-24 NOTE — Anesthesia Postprocedure Evaluation (Signed)
 Anesthesia Post Note  Patient: [name] [name]  Procedure(s) Performed: SIGMOIDOSCOPY, FLEXIBLE REMOVAL, FOREIGN BODY     Patient location during evaluation: PACU Anesthesia Type: MAC Level of consciousness: awake and alert Pain management: pain level controlled Vital Signs Assessment: post-procedure vital signs reviewed and stable Respiratory status: spontaneous breathing, nonlabored ventilation, respiratory function stable and patient connected to nasal cannula oxygen  Cardiovascular status: stable and blood pressure returned to baseline Postop Assessment: no apparent nausea or vomiting Anesthetic complications: no   No notable events documented.  Last Vitals:  Vitals:   10/24/24 0357 10/24/24 0812  BP: 117/60   Pulse: 67   Resp: 18   Temp: 36.7 C   SpO2: 98% 97%    Last Pain:  Vitals:   10/24/24 0357  TempSrc: Oral  PainSc:                  Keirah Konitzer E

## 2024-10-25 NOTE — Discharge Summary (Signed)
 Physician Discharge Summary  SALEM LEMBKE [name]:[id number] DOB: August 05, 1951 DOA: 10/18/2024  PCP: [name], MD  Admit date: 10/18/2024 Discharge date: 10/25/2024  Admitted From: Home Disposition: Home  Recommendations for Outpatient Follow-up:  Follow up with PCP in 1-2 weeks Please obtain BMP/CBC in one week Please follow up with GI  Home Health: None Equipment/Devices: None Discharge Condition: Stable CODE STATUS: Full code Diet recommendation: Cardiac  Brief/Interim Summary:  73 year old gentleman history of paroxysmal A-fib, a flutter, chronic diastolic heart failure, CAD status post stent placement, SSS, COPD, GERD, seizure, BPH, hypogonadism, gout, chronic tinnitus and vertigo, NIDDM type II presented to the ED with episode of nausea vomiting abdominal discomfort and presyncopal episode.  Patient seen in the ED noted to be febrile with a temp of 102, tachypneic otherwise hemodynamically stable.  Lab work concerning for leukocytosis.  CT abdomen and pelvis done concerning for intraluminal foreign body with concerns for colonic perforation and moderate surrounding inflammatory changes as well as diverticulosis, patchy bibasilar airspace infiltrates consistent with acute multifocal infection.  CT head unremarkable.  Patient placed on empiric IV antibiotics.  General surgery consulted.  Patient underwent repeat CT scans of the abdomen and pelvis with unchanged position of foreign body, GI consulted.  Patient for colonoscopy 10/22/2024.   Discharge Diagnoses:  Principal Problem:   Acute diverticulitis Active Problems:   Multifocal pneumonia   Foreign body alimentary tract-questionable   Paroxysmal atrial fibrillation (HCC)   Chronic vertigo   Sick sinus syndrome (HCC)   COPD (chronic obstructive pulmonary disease) (HCC)   Syncope and collapse   Chronic diastolic CHF (congestive heart failure) (HCC)   History of CAD (coronary artery disease)   History of seizure   Non-insulin  dependent type 2 diabetes mellitus (HCC)     #1 sepsis in the setting of probable acute diverticulitis and multifocal pneumonia/foreign body in sigmoid colon  - Patient on admission had presented with sudden onset abdominal pain, nausea vomiting presyncopal episode. - Patient on admission noted to be tachypneic, noted to have a leukocytosis, afebrile with a temp of 102. -- CT abdomen pelvis showing Wire-like metallic intraluminal foreign body (~6 cm) perforating the sigmoid colon at both ends with moderate surrounding inflammatory change. No free intraperitoneal gas or fluid, no pericolonic fluid collection, and no bowel obstruction. Surgical consultation recommended.  Moderate descending and sigmoid diverticulosis.  - Patient with no acute abdominal signs or symptoms on examination without any rebound or guarding or abdominal rigidity. - General Surgery noted to have evaluated patient on admission with concern for  possible chicken bone foreign body in the sigmoid colon.  Recommended to keep n.p.o. and bowel rest.  IV antibiotics Zosyn .  Serial exam. Most likely will consider repeating CAT scan to see if the foreign body has passed. If not or patient worse, may require operative resection and Hartmann resection if he truly has perforated his colon. There is no need for emergency surgery tonight but surgery will help follow.  - Repeat CT abdomen and pelvis done with stable CT, unchanged position of hyperdense 6 cm curvilinear abdomen foreign body in the proximal sigmoid colon with apparent mural perforations, associated wall thickening, surrounding fat stranding, no pericolonic free air or measurable free fluid. Repeat CT scan 10/23/2024 shows no evidence of foreign body patient feels improved and better will start him on a soft diet.Patient seen in consultation by GI and had colonoscopy done on 29 October.  Findings include a foreign body sharp object likely chicken bone was found in  the proximal sigmoid  colon in a transverse orientation embedding into the mucosa on both sides.  This is broken down into 2 pieces using a forceps and then retrieved separately.  Diverticulosis in the sigmoid colon.  Repeat CT scan showed no evidence of foreign body.    2.  Multifocal pneumonia patient remains on room air with no complaints of cough or shortness of breath -Noted on CT abdomen and pelvis.  However he received Zosyn .   3.  History of atrial flutter/paroxysmal A-fib continue Toprol  and digoxin .   4.  History of SSS -Outpatient follow-up.   5.  Chronic diastolic heart failure - Continue Toprol -XL, digoxin .   Continue Lasix  and losartan.   6.  History of COPD -Stable. - DuoNebs as needed.   7.  History of CAD -Stable - Toprol -XL.     8.  GERD - Continue IV PPI.   9.  History of seizure - Continue Lamictal .     10.  Non-insulin-dependent diabetes mellitus type 2 -Hemoglobin A1c noted at 5.9 (02/01/2024) - Repeat hemoglobin A1c 5.3. Continue Jardiance .  Estimated body mass index is 36.81 kg/m as calculated from the following:   Height as of this encounter: 5' 7 (1.702 m).   Weight as of this encounter: 106.6 kg.  Discharge Instructions  Discharge Instructions     Diet - low sodium heart healthy   Complete by: As directed    Increase activity slowly   Complete by: As directed       Allergies as of 10/24/2024   No Known Allergies      Medication List     STOP taking these medications    amiodarone  200 MG tablet Commonly known as: PACERONE    clotrimazole -betamethasone  cream Commonly known as: Lotrisone    diclofenac  Sodium 1 % Gel Commonly known as: Voltaren    levocetirizine 5 MG tablet Commonly known as: XYZAL    potassium chloride  10 MEQ tablet Commonly known as: KLOR-CON        TAKE these medications    acetaminophen  325 MG tablet Commonly known as: TYLENOL  Take 2 tablets (650 mg total) by mouth every 6 (six) hours as needed for mild pain (or Fever  >/= 101).   albuterol  (2.5 MG/3ML) 0.083% nebulizer solution Commonly known as: PROVENTIL  Take 3 mLs (2.5 mg total) by nebulization every 6 (six) hours as needed for wheezing or shortness of breath.   albuterol  108 (90 Base) MCG/ACT inhaler Commonly known as: VENTOLIN  HFA Inhale 2 puffs into the lungs every 6 (six) hours as needed.   allopurinol  300 MG tablet Commonly known as: ZYLOPRIM  TAKE 1 TABLET BY MOUTH EVERY DAY   Arnuity Ellipta  100 MCG/ACT Aepb Generic drug: Fluticasone  Furoate Inhale 1 puff into the lungs daily. What changed:  when to take this reasons to take this   aspirin  EC 81 MG tablet Take 81 mg by mouth daily as needed.   atorvastatin  40 MG tablet Commonly known as: LIPITOR Take 1 tablet (40 mg total) by mouth daily.   digoxin  0.125 MG tablet Commonly known as: LANOXIN  Take 1 tablet (0.125 mg total) by mouth daily.   empagliflozin  10 MG Tabs tablet Commonly known as: Jardiance  Take 1 tablet (10 mg total) by mouth daily before breakfast.   ferrous sulfate  325 (65 FE) MG tablet Take 325 mg by mouth daily with breakfast. Reported on 03/16/2016   fluticasone  50 MCG/ACT nasal spray Commonly known as: FLONASE  Place 2 sprays into both nostrils daily.   furosemide  20 MG tablet Commonly known  as: LASIX  TAKE 1 TABLET BY MOUTH EVERY DAY What changed:  when to take this reasons to take this   lamoTRIgine  100 MG tablet Commonly known as: LAMICTAL  TAKE 1 TABLET BY MOUTH TWICE A DAY   metoprolol  succinate 25 MG 24 hr tablet Commonly known as: TOPROL -XL TAKE 1 TABLET (25 MG TOTAL) BY MOUTH DAILY.   nitroGLYCERIN  0.4 MG SL tablet Commonly known as: NITROSTAT  Place 1 tablet (0.4 mg total) under the tongue every 5 (five) minutes as needed for chest pain.   pantoprazole  40 MG tablet Commonly known as: PROTONIX  Take 1 tablet (40 mg total) by mouth daily.   tacrolimus 0.1 % ointment Commonly known as: PROTOPIC Apply 1 Application topically 2 (two) times  daily.   tamsulosin  0.4 MG Caps capsule Commonly known as: FLOMAX  Take 1 capsule (0.4 mg total) by mouth daily.        Follow-up Information     [name], MD Follow up.   Specialty: Family Medicine Contact information: [address] [place] [address] [phone]         Legrand Victory LITTIE DOUGLAS, MD Follow up.   Specialty: Gastroenterology Contact information: [address] [place] [address] [place] [address] [phone]                No Known Allergies  Consultations: GI and general surgery   Procedures/Studies: CT ABDOMEN PELVIS WO CONTRAST Result Date: 10/23/2024 CLINICAL DATA:  Evaluate for perforation post flexible sigmoidoscopy. Chicken bone removed from left colon. EXAM: CT ABDOMEN AND PELVIS WITHOUT CONTRAST TECHNIQUE: Multidetector CT imaging of the abdomen and pelvis was performed following the standard protocol without IV contrast. RADIATION DOSE REDUCTION: This exam was performed according to the departmental dose-optimization program which includes automated exposure control, adjustment of the mA and/or kV according to patient size and/or use of iterative reconstruction technique. COMPARISON:  10/21/2024, 10/18/2024 FINDINGS: Lower chest: Heart is normal size. Possible small sliding hiatal hernia. Visualized lung bases are clear. Hepatobiliary: Liver, gallbladder and biliary tree are normal. Pancreas: Normal. Spleen: Normal. Adrenals/Urinary Tract: Adrenal glands are normal. Kidneys are normal in size without hydronephrosis or nephrolithiasis. Ureters and bladder are normal. Stomach/Bowel: Mild distention of the stomach with fluid/contrast. Small bowel is unremarkable. Appendix is normal. Mild diverticulosis of the colon. Contrast present within the colon to the level of the sigmoid colon. Previously seen foreign body/chin bone over the sigmoid colon in the left lower quadrant is no longer visualized and compatible with recent removal.  Minimal density projects from the right lateral wall of the sigmoid colon in the left lower quadrant in the area the chicken bone had previously perforated. No current evidence of free air. No residual foreign body present. Remainder of the colon is unremarkable. Vascular/Lymphatic: Abdominal aorta is normal caliber. No adenopathy. Reproductive: Prostate is unremarkable. Other: Small amount of free fluid in the pelvis. Musculoskeletal: No focal abnormality. IMPRESSION: 1. No acute findings in the abdomen/pelvis. Previously seen foreign body/chicken bone over the sigmoid colon in the left lower quadrant is no longer visualized compatible with recent removal. No residual foreign body present. No free peritoneal air. 2. Mild diverticulosis of the colon. 3. Small amount of free fluid in the pelvis. Electronically Signed   By: Toribio Agreste M.D.   On: 10/23/2024 10:56   CT ABDOMEN PELVIS WO CONTRAST Result Date: 10/21/2024 EXAM: CT ABDOMEN AND PELVIS WITHOUT CONTRAST 10/21/2024 05:58:39 AM TECHNIQUE: CT of the abdomen and pelvis was performed without the administration of  intravenous contrast. Multiplanar reformatted images are provided for review. Automated exposure control, iterative reconstruction, and/or weight-based adjustment of the mA/kV was utilized to reduce the radiation dose to as low as reasonably achievable. COMPARISON: 10/19/2024 CLINICAL HISTORY: Foreign body, colon; chicken bone with colonic perforation. FINDINGS: LOWER CHEST: Patchy ground glass opacity and minimal consolidation in both lung bases, without significant change. Stable small hiatal hernia. LIVER: The liver is unremarkable. GALLBLADDER AND BILE DUCTS: Gallbladder is unremarkable. No biliary ductal dilatation. SPLEEN: No acute abnormality. PANCREAS: No acute abnormality. ADRENAL GLANDS: No acute abnormality. KIDNEYS, URETERS AND BLADDER: No stones in the kidneys or ureters. No hydronephrosis. No perinephric or periureteral stranding.  Urinary bladder is unremarkable. GI AND BOWEL: Stomach demonstrates no acute abnormality. A thin curvilinear radiodense foreign body measuring approximately 6 cm in length is again seen in the proximal sigmoid colon, unchanged in position since the previous study. This shows transmural perforation with associated mild colonic wall thickening and soft tissue stranding and pericolonic fat. No evidence of abnormal fluid collections. No evidence of bowel obstruction. Sigmoid diverticulosis again noted. PERITONEUM AND RETROPERITONEUM: No ascites. No free air. VASCULATURE: Aorta is normal in caliber. LYMPH NODES: No lymphadenopathy. REPRODUCTIVE ORGANS: No acute abnormality. BONES AND SOFT TISSUES: No acute osseous abnormality. No focal soft tissue abnormality. IMPRESSION: 1. Thin curvilinear radiodense foreign body in the proximal sigmoid colon with transmural perforation, unchanged in position. No evidence of abscess or free intraperitoneal air. 2. Sigmoid diverticulosis. 3. Small hiatal hernia, stable. Electronically signed by: Norleen Kil MD 10/21/2024 06:15 AM EDT RP Workstation: [id number]   CT ABDOMEN PELVIS WO CONTRAST Result Date: 10/19/2024 EXAM: CT ABDOMEN AND PELVIS WITHOUT CONTRAST 10/19/2024 01:52:54 PM TECHNIQUE: CT of the abdomen and pelvis was performed without the administration of intravenous contrast. Multiplanar reformatted images are provided for review. Automated exposure control, iterative reconstruction, and/or weight-based adjustment of the mA/kV was utilized to reduce the radiation dose to as low as reasonably achievable. COMPARISON: CT abdomen and pelvis dated 10/18/2024. CLINICAL HISTORY: Diverticulitis, complication suspected; Follow-up with passing of the foreign body. F/u foreign body. FINDINGS: LOWER CHEST: Moderate patchy ground glass opacity and minimal patchy consolidation at both lung bases, slightly improved. LIVER: The liver is unremarkable. GALLBLADDER AND BILE DUCTS: Gallbladder  is unremarkable. No biliary ductal dilatation. SPLEEN: No acute abnormality. PANCREAS: No acute abnormality. ADRENAL GLANDS: No acute abnormality. KIDNEYS, URETERS AND BLADDER: No stones in the kidneys or ureters. No hydronephrosis. No perinephric or periureteral stranding. Urinary bladder is unremarkable. GI AND BOWEL: Small hiatal hernia, unchanged. Otherwise normal nondistended stomach. No change in the position of the hyperdense curvilinear 6 cm apparent foreign body in the proximal sigmoid colon with apparent perforation of the sigmoid colon wall superiorly and inferiorly with associated stable wall thickening and surrounding fat stranding. No pericolonic free air or measurable fluid collection. Moderate underlying sigmoid diverticulosis. No dilated or thick walled small bowel loops. Normal appendix. PERITONEUM AND RETROPERITONEUM: No ascites. No free air. VASCULATURE: Aorta is normal in caliber. LYMPH NODES: No lymphadenopathy. REPRODUCTIVE ORGANS: No acute abnormality. BONES AND SOFT TISSUES: Moderate lumbar spondylosis. No acute osseous abnormality. No focal soft tissue abnormality. IMPRESSION: 1. Stable CT compared to yesterday. Unchanged position of hyperdense 6 cm curvilinear apparent foreign body in the proximal sigmoid colon with apparent mural perforations, associated wall thickening, and surrounding fat stranding; no pericolonic free air or measurable fluid collection. Electronically signed by: Selinda Blue MD 10/19/2024 02:36 PM EDT RP Workstation: [id number]   DG Abd Portable 1V Result Date: 10/19/2024  EXAM: 1 VIEW XRAY OF THE ABDOMEN 10/19/2024 04:42:00 AM COMPARISON: CT abdomen and pelvis yesterday. CLINICAL HISTORY: Foreign, possible chicken bone in sigmoid colon. FINDINGS: LINES, TUBES AND DEVICES: EKG leads noted. BOWEL: Nonobstructive bowel gas pattern. The left lower quadrant curvilinear foreign body appearing lesion located in the sigmoid colon is not apparent radiographically. SOFT  TISSUES: No opaque urinary calculi. BONES: Degenerative changes of lumbar spine. No acute osseous abnormality. IMPRESSION: 1. CT suspected curvilinear foreign body in the sigmoid colon not radiographically visible. 2. Nonobstructive bowel gas pattern. Electronically signed by: Helayne Hurst MD 10/19/2024 04:53 AM EDT RP Workstation: [id number]   CT ABDOMEN PELVIS WO CONTRAST Result Date: 10/18/2024 EXAM: CT ABDOMEN AND PELVIS WITHOUT CONTRAST 10/18/2024 10:43:25 PM TECHNIQUE: CT of the abdomen and pelvis was performed without the administration of intravenous contrast. Multiplanar reformatted images are provided for review. Automated exposure control, iterative reconstruction, and/or weight-based adjustment of the mA/kV was utilized to reduce the radiation dose to as low as reasonably achievable. COMPARISON: None available. CLINICAL HISTORY: Diverticulitis, complication suspected. FINDINGS: LOWER CHEST: Patchy bibasilar airspace infiltrates are present in keeping with multifocal infection in the acute setting. LIVER: The liver is unremarkable. GALLBLADDER AND BILE DUCTS: Gallbladder is unremarkable. No biliary ductal dilatation. A small hiatal hernia is present. SPLEEN: No acute abnormality. PANCREAS: No acute abnormality. ADRENAL GLANDS: No acute abnormality. KIDNEYS, URETERS AND BLADDER: No stones in the kidneys or ureters. No hydronephrosis. No perinephric or periureteral stranding. Urinary bladder is unremarkable. GI AND BOWEL: A wire-like metallic foreign body measuring roughly 6 cm in length has perforated the wall of the sigmoid colon at both ends with moderate surrounding inflammatory change identified. This is best seen on axial images 64/2 through 69/2. There is moderate background descending and sigmoid colonic diverticulosis. No free intraperitoneal gas or fluid. No pericolonic fluid collection identified. No evidence of obstruction. The stomach, small bowel, and large bowel are otherwise unremarkable.  The appendix is normal. PERITONEUM AND RETROPERITONEUM: No ascites. No free air. VASCULATURE: Mild aortoiliac atherosclerotic calcification. No aortic aneurysm. LYMPH NODES: No lymphadenopathy. REPRODUCTIVE ORGANS: No acute abnormality. BONES AND SOFT TISSUES: Moderate right and severe left hip degenerative arthritis. Degenerative changes are seen within the visualized thoracolumbar spine. No acute bone abnormality. No lytic or blastic bone lesion. IMPRESSION: 1. Wire-like metallic intraluminal foreign body (~6 cm) perforating the sigmoid colon at both ends with moderate surrounding inflammatory change. No free intraperitoneal gas or fluid, no pericolonic fluid collection, and no bowel obstruction. Surgical consultation recommended. 2. Moderate descending and sigmoid diverticulosis. 3. Patchy bibasilar airspace infiltrates consistent with acute multifocal infection. Electronically signed by: Dorethia Molt MD 10/18/2024 10:55 PM EDT RP Workstation: [id number]   CT HEAD WO CONTRAST Result Date: 10/18/2024 EXAM: CT HEAD WITHOUT CONTRAST 10/18/2024 06:56:18 PM TECHNIQUE: CT of the head was performed without the administration of intravenous contrast. Automated exposure control, iterative reconstruction, and/or weight based adjustment of the mA/kV was utilized to reduce the radiation dose to as low as reasonably achievable. COMPARISON: 04/17/2017 CLINICAL HISTORY: Mental status change, unknown cause. Pt BIB ems after having a syncope episode and vomiting after waking up on his dinning table. Pt states feeling weak and not well today. VS stable. Hx of afib. FINDINGS: BRAIN AND VENTRICLES: No acute hemorrhage. No evidence of acute infarct. Subcortical and periventricular small vessel ischemic changes, particularly in the subcortical right frontal lobe. No hydrocephalus. No extra-axial collection. No mass effect or midline shift. ORBITS: No acute abnormality. SINUSES: No acute abnormality. SOFT TISSUES AND SKULL:  No  acute soft tissue abnormality. No skull fracture. IMPRESSION: 1. No acute intracranial abnormality. Electronically signed by: Pinkie Pebbles MD 10/18/2024 07:02 PM EDT RP Workstation: [id number]   DG Chest Port 1 View Result Date: 10/18/2024 EXAM: 1 VIEW XRAY OF THE CHEST 01/20/2019 COMPARISON: None available. CLINICAL HISTORY: LH?SABRA Syncope episode and vomiting after waking up on his dinning table. Hx of afib FINDINGS: LUNGS AND PLEURA: No focal pulmonary opacity. No pulmonary edema. No pleural effusion. No pneumothorax. HEART AND MEDIASTINUM: No acute abnormality of the cardiac and mediastinal silhouettes. BONES AND SOFT TISSUES: No acute osseous abnormality. IMPRESSION: 1. No acute process. Electronically signed by: Greig Pique MD 10/18/2024 06:41 PM EDT RP Workstation: [id number]   (Echo, Carotid, EGD, Colonoscopy, ERCP)    Subjective:  Patient resting in bed he ate a regular diet and is anxious to go home denies any abdominal pain nausea vomiting Discharge Exam: Vitals:   10/24/24 0922 10/24/24 0924  BP: (!) 159/85 (!) 159/85  Pulse: 66 66  Resp:    Temp:    SpO2:     Vitals:   10/24/24 0812 10/24/24 0904 10/24/24 0922 10/24/24 0924  BP:   (!) 159/85 (!) 159/85  Pulse:  66 66 66  Resp:      Temp:      TempSrc:      SpO2: 97%     Weight:      Height:        General: Pt is alert, awake, not in acute distress Cardiovascular: RRR, S1/S2 +, no rubs, no gallops Respiratory: CTA bilaterally, no wheezing, no rhonchi Abdominal: Soft, NT, ND, bowel sounds + Extremities: no edema, no cyanosis    The results of significant diagnostics from this hospitalization (including imaging, microbiology, ancillary and laboratory) are listed below for reference.     Microbiology: Recent Results (from the past 240 hours)  Resp panel by RT-PCR (RSV, Flu A&B, Covid) Anterior Nasal Swab     Status: None   Collection Time: 10/18/24 10:51 PM   Specimen: Anterior Nasal Swab  Result Value Ref  Range Status   SARS Coronavirus 2 by RT PCR NEGATIVE NEGATIVE Final    Comment: (NOTE) SARS-CoV-2 target nucleic acids are NOT DETECTED.  The SARS-CoV-2 RNA is generally detectable in upper respiratory specimens during the acute phase of infection. The lowest concentration of SARS-CoV-2 viral copies this assay can detect is 138 copies/mL. A negative result does not preclude SARS-Cov-2 infection and should not be used as the sole basis for treatment or other patient management decisions. A negative result may occur with  improper specimen collection/handling, submission of specimen other than nasopharyngeal swab, presence of viral mutation(s) within the areas targeted by this assay, and inadequate number of viral copies(<138 copies/mL). A negative result must be combined with clinical observations, patient history, and epidemiological information. The expected result is Negative.  Fact Sheet for Patients:  bloggercourse.com  Fact Sheet for Healthcare Providers:  seriousbroker.it  This test is no t yet approved or cleared by the [place]  FDA and  has been authorized for detection and/or diagnosis of SARS-CoV-2 by FDA under an Emergency Use Authorization (EUA). This EUA will remain  in effect (meaning this test can be used) for the duration of the COVID-19 declaration under Section 564(b)(1) of the Act, 21 U.S.C.section 360bbb-3(b)(1), unless the authorization is terminated  or revoked sooner.       Influenza A by PCR NEGATIVE NEGATIVE Final   Influenza B by PCR NEGATIVE NEGATIVE Final  Comment: (NOTE) The Xpert Xpress SARS-CoV-2/FLU/RSV plus assay is intended as an aid in the diagnosis of influenza from Nasopharyngeal swab specimens and should not be used as a sole basis for treatment. Nasal washings and aspirates are unacceptable for Xpert Xpress SARS-CoV-2/FLU/RSV testing.  Fact Sheet for  Patients: bloggercourse.com  Fact Sheet for Healthcare Providers: seriousbroker.it  This test is not yet approved or cleared by the [place]  FDA and has been authorized for detection and/or diagnosis of SARS-CoV-2 by FDA under an Emergency Use Authorization (EUA). This EUA will remain in effect (meaning this test can be used) for the duration of the COVID-19 declaration under Section 564(b)(1) of the Act, 21 U.S.C. section 360bbb-3(b)(1), unless the authorization is terminated or revoked.     Resp Syncytial Virus by PCR NEGATIVE NEGATIVE Final    Comment: (NOTE) Fact Sheet for Patients: bloggercourse.com  Fact Sheet for Healthcare Providers: seriousbroker.it  This test is not yet approved or cleared by the [place]  FDA and has been authorized for detection and/or diagnosis of SARS-CoV-2 by FDA under an Emergency Use Authorization (EUA). This EUA will remain in effect (meaning this test can be used) for the duration of the COVID-19 declaration under Section 564(b)(1) of the Act, 21 U.S.C. section 360bbb-3(b)(1), unless the authorization is terminated or revoked.  Performed at [facility], [address], [place], [place] [address]   Culture, blood (Routine X 2) w Reflex to ID Panel     Status: None   Collection Time: 10/18/24 11:40 PM   Specimen: BLOOD  Result Value Ref Range Status   Specimen Description   Final    BLOOD RIGHT ANTECUBITAL Performed at [facility], [address] [place], [place] [address]    Special Requests   Final    BOTTLES DRAWN AEROBIC AND ANAEROBIC Blood Culture results may not be optimal due to an inadequate volume of blood received in culture bottles Performed at [facility], [address] [place], [place] [address]    Culture   Final    NO GROWTH 5 DAYS Performed at [facility], [address] [place], [place] [address]    Report Status 10/24/2024 FINAL  Final  Respiratory (~20 pathogens) panel by PCR     Status: None   Collection Time: 10/18/24 11:58 PM   Specimen: Nasopharyngeal Swab; Respiratory  Result Value Ref Range Status   Adenovirus NOT DETECTED NOT DETECTED Final   Coronavirus 229E NOT DETECTED NOT DETECTED Final    Comment: (NOTE) The Coronavirus on the Respiratory Panel, DOES NOT test for the novel  Coronavirus (2019 nCoV)    Coronavirus HKU1 NOT DETECTED NOT DETECTED Final   Coronavirus NL63 NOT DETECTED NOT DETECTED Final   Coronavirus OC43 NOT DETECTED NOT DETECTED Final   Metapneumovirus NOT DETECTED NOT DETECTED Final   Rhinovirus / Enterovirus NOT DETECTED NOT DETECTED Final   Influenza A NOT DETECTED NOT DETECTED Final   Influenza B NOT DETECTED NOT DETECTED Final   Parainfluenza Virus 1 NOT DETECTED NOT DETECTED Final   Parainfluenza Virus 2 NOT DETECTED NOT DETECTED Final   Parainfluenza Virus 3 NOT DETECTED NOT DETECTED Final   Parainfluenza Virus 4 NOT DETECTED NOT DETECTED Final   Respiratory Syncytial Virus NOT DETECTED NOT DETECTED Final   Bordetella pertussis NOT DETECTED NOT DETECTED Final   Bordetella Parapertussis NOT DETECTED NOT DETECTED Final   Chlamydophila pneumoniae NOT DETECTED NOT DETECTED Final   Mycoplasma pneumoniae NOT DETECTED NOT  DETECTED Final    Comment: Performed at [facility], [address], [place], [place] [address]  Culture, blood (Routine X 2) w Reflex to ID Panel     Status: None   Collection Time: 10/19/24  1:43 AM   Specimen: BLOOD  Result Value Ref Range Status   Specimen Description   Final    BLOOD BLOOD RIGHT HAND Performed at [facility], [address] [place], [place] [address]    Special Requests   Final    BOTTLES DRAWN AEROBIC AND ANAEROBIC Blood Culture adequate volume Performed at [facility], [address],  [place], [place] [address]    Culture   Final    NO GROWTH 5 DAYS Performed at [facility], [address], [place], [place] [address]    Report Status 10/24/2024 FINAL  Final  MRSA Next Gen by PCR, Nasal     Status: Abnormal   Collection Time: 10/20/24  3:08 PM   Specimen: Nasal Mucosa; Nasal Swab  Result Value Ref Range Status   MRSA by PCR Next Gen DETECTED (A) NOT DETECTED Final    Comment: (NOTE) The GeneXpert MRSA Assay (FDA approved for NASAL specimens only), is one component of a comprehensive MRSA colonization surveillance program. It is not intended to diagnose MRSA infection nor to guide or monitor treatment for MRSA infections. Test performance is not FDA approved in patients less than 39 years old. Performed at [facility], [address], [place], [place] [address]      Labs: BNP (last 3 results) No results for input(s): BNP in the last 8760 hours. Basic Metabolic Panel: Recent Labs  Lab 10/18/24 2006 10/19/24 0424 10/20/24 1002 10/21/24 0741 10/22/24 0748 10/23/24 0906  NA 142 139 136 137 139 138  K 4.0 4.1 4.6 3.8 4.0 3.8  CL 104 105 104 104 106 104  CO2 25 24 20* 24 25 22   GLUCOSE 81 105* 89 83 88 86  BUN 13 15 15 11  6* <5*  CREATININE 1.40* 1.51* 1.48* 1.62* 1.53* 1.43*  CALCIUM  9.4 8.6* 8.9 8.9 8.7* 8.8*  MG 2.1  --   --   --   --   --    Liver Function Tests: Recent Labs  Lab 10/18/24 2006 10/19/24 0424  AST 28 21  ALT 13 8  ALKPHOS 98 75  BILITOT 0.5 0.5  PROT 8.6* 6.6  ALBUMIN  3.7 2.9*   Recent Labs  Lab 10/18/24 2006  LIPASE 33   No results for input(s): AMMONIA in the last 168 hours. CBC: Recent Labs  Lab 10/18/24 2006 10/19/24 0424 10/20/24 1002 10/21/24 0741 10/22/24 0748 10/23/24 0906  WBC 15.2* 24.8* 15.3* 9.8 7.4 7.5  NEUTROABS 13.2*  --   --   --  4.9  --   HGB 12.1* 9.7* 11.0* 9.5* 9.3* 10.1*  HCT 42.2 31.9* 36.6* 31.6* 31.9* 33.4*  MCV 73.9* 73.8* 71.2* 74.4* 72.7* 73.4*  PLT 395 306  264 280 298 300   Cardiac Enzymes: No results for input(s): CKTOTAL, CKMB, CKMBINDEX, TROPONINI in the last 168 hours. BNP: Invalid input(s): POCBNP CBG: Recent Labs  Lab 10/24/24 0731  GLUCAP 96   D-Dimer No results for input(s): DDIMER in the last 72 hours. Hgb A1c No results for input(s): HGBA1C in the last 72 hours. Lipid Profile No results for input(s): CHOL, HDL, LDLCALC, TRIG, CHOLHDL, LDLDIRECT in the last 72 hours. Thyroid  function studies No results for input(s):  TSH, T4TOTAL, T3FREE, THYROIDAB in the last 72 hours.  Invalid input(s): FREET3 Anemia work up No results for input(s): VITAMINB12, FOLATE, FERRITIN, TIBC, IRON, RETICCTPCT in the last 72 hours. Urinalysis    Component Value Date/Time   COLORURINE YELLOW 10/19/2024 1351   APPEARANCEUR CLEAR 10/19/2024 1351   APPEARANCEUR Clear 06/12/2024 0000   LABSPEC 1.015 10/19/2024 1351   PHURINE 7.0 10/19/2024 1351   GLUCOSEU NEGATIVE 10/19/2024 1351   HGBUR LARGE (A) 10/19/2024 1351   BILIRUBINUR NEGATIVE 10/19/2024 1351   BILIRUBINUR Negative 06/12/2024 0000   KETONESUR NEGATIVE 10/19/2024 1351   PROTEINUR NEGATIVE 10/19/2024 1351   UROBILINOGEN 0.2 02/01/2023 0000   UROBILINOGEN 1.0 01/26/2011 0647   NITRITE NEGATIVE 10/19/2024 1351   LEUKOCYTESUR NEGATIVE 10/19/2024 1351   Sepsis Labs Recent Labs  Lab 10/20/24 1002 10/21/24 0741 10/22/24 0748 10/23/24 0906  WBC 15.3* 9.8 7.4 7.5   Microbiology Recent Results (from the past 240 hours)  Resp panel by RT-PCR (RSV, Flu A&B, Covid) Anterior Nasal Swab     Status: None   Collection Time: 10/18/24 10:51 PM   Specimen: Anterior Nasal Swab  Result Value Ref Range Status   SARS Coronavirus 2 by RT PCR NEGATIVE NEGATIVE Final    Comment: (NOTE) SARS-CoV-2 target nucleic acids are NOT DETECTED.  The SARS-CoV-2 RNA is generally detectable in upper respiratory specimens during the acute phase of infection. The  lowest concentration of SARS-CoV-2 viral copies this assay can detect is 138 copies/mL. A negative result does not preclude SARS-Cov-2 infection and should not be used as the sole basis for treatment or other patient management decisions. A negative result may occur with  improper specimen collection/handling, submission of specimen other than nasopharyngeal swab, presence of viral mutation(s) within the areas targeted by this assay, and inadequate number of viral copies(<138 copies/mL). A negative result must be combined with clinical observations, patient history, and epidemiological information. The expected result is Negative.  Fact Sheet for Patients:  bloggercourse.com  Fact Sheet for Healthcare Providers:  seriousbroker.it  This test is no t yet approved or cleared by the [place]  FDA and  has been authorized for detection and/or diagnosis of SARS-CoV-2 by FDA under an Emergency Use Authorization (EUA). This EUA will remain  in effect (meaning this test can be used) for the duration of the COVID-19 declaration under Section 564(b)(1) of the Act, 21 U.S.C.section 360bbb-3(b)(1), unless the authorization is terminated  or revoked sooner.       Influenza A by PCR NEGATIVE NEGATIVE Final   Influenza B by PCR NEGATIVE NEGATIVE Final    Comment: (NOTE) The Xpert Xpress SARS-CoV-2/FLU/RSV plus assay is intended as an aid in the diagnosis of influenza from Nasopharyngeal swab specimens and should not be used as a sole basis for treatment. Nasal washings and aspirates are unacceptable for Xpert Xpress SARS-CoV-2/FLU/RSV testing.  Fact Sheet for Patients: bloggercourse.com  Fact Sheet for Healthcare Providers: seriousbroker.it  This test is not yet approved or cleared by the [place]  FDA and has been authorized for detection and/or diagnosis of SARS-CoV-2 by FDA under  an Emergency Use Authorization (EUA). This EUA will remain in effect (meaning this test can be used) for the duration of the COVID-19 declaration under Section 564(b)(1) of the Act, 21 U.S.C. section 360bbb-3(b)(1), unless the authorization is terminated or revoked.     Resp Syncytial Virus by PCR NEGATIVE NEGATIVE Final    Comment: (NOTE) Fact Sheet for Patients: bloggercourse.com  Fact Sheet for Healthcare Providers: seriousbroker.it  This  test is not yet approved or cleared by the [place]  FDA and has been authorized for detection and/or diagnosis of SARS-CoV-2 by FDA under an Emergency Use Authorization (EUA). This EUA will remain in effect (meaning this test can be used) for the duration of the COVID-19 declaration under Section 564(b)(1) of the Act, 21 U.S.C. section 360bbb-3(b)(1), unless the authorization is terminated or revoked.  Performed at [facility], [address], [place], [place] [address]   Culture, blood (Routine X 2) w Reflex to ID Panel     Status: None   Collection Time: 10/18/24 11:40 PM   Specimen: BLOOD  Result Value Ref Range Status   Specimen Description   Final    BLOOD RIGHT ANTECUBITAL Performed at [facility], [address], [place], [place] [address]    Special Requests   Final    BOTTLES DRAWN AEROBIC AND ANAEROBIC Blood Culture results may not be optimal due to an inadequate volume of blood received in culture bottles Performed at [facility], [address], [place], [place] [address]    Culture   Final    NO GROWTH 5 DAYS Performed at [facility], [address] [place], [place] [address]    Report Status 10/24/2024 FINAL  Final  Respiratory (~20 pathogens) panel by PCR     Status: None   Collection Time: 10/18/24 11:58 PM   Specimen: Nasopharyngeal Swab; Respiratory  Result Value Ref Range Status   Adenovirus  NOT DETECTED NOT DETECTED Final   Coronavirus 229E NOT DETECTED NOT DETECTED Final    Comment: (NOTE) The Coronavirus on the Respiratory Panel, DOES NOT test for the novel  Coronavirus (2019 nCoV)    Coronavirus HKU1 NOT DETECTED NOT DETECTED Final   Coronavirus NL63 NOT DETECTED NOT DETECTED Final   Coronavirus OC43 NOT DETECTED NOT DETECTED Final   Metapneumovirus NOT DETECTED NOT DETECTED Final   Rhinovirus / Enterovirus NOT DETECTED NOT DETECTED Final   Influenza A NOT DETECTED NOT DETECTED Final   Influenza B NOT DETECTED NOT DETECTED Final   Parainfluenza Virus 1 NOT DETECTED NOT DETECTED Final   Parainfluenza Virus 2 NOT DETECTED NOT DETECTED Final   Parainfluenza Virus 3 NOT DETECTED NOT DETECTED Final   Parainfluenza Virus 4 NOT DETECTED NOT DETECTED Final   Respiratory Syncytial Virus NOT DETECTED NOT DETECTED Final   Bordetella pertussis NOT DETECTED NOT DETECTED Final   Bordetella Parapertussis NOT DETECTED NOT DETECTED Final   Chlamydophila pneumoniae NOT DETECTED NOT DETECTED Final   Mycoplasma pneumoniae NOT DETECTED NOT DETECTED Final    Comment: Performed at [facility], [address] [place], [place] [address]  Culture, blood (Routine X 2) w Reflex to ID Panel     Status: None   Collection Time: 10/19/24  1:43 AM   Specimen: BLOOD  Result Value Ref Range Status   Specimen Description   Final    BLOOD BLOOD RIGHT HAND Performed at [facility], [address], [place], [place] [address]    Special Requests   Final    BOTTLES DRAWN AEROBIC AND ANAEROBIC Blood Culture adequate volume Performed at [facility], [address], [place], [place] [address]    Culture   Final    NO GROWTH 5 DAYS Performed at [facility], [address] [place], [place] [address]    Report Status 10/24/2024 FINAL  Final  MRSA Next Gen by PCR, Nasal  Status: Abnormal   Collection Time: 10/20/24  3:08 PM   Specimen: Nasal  Mucosa; Nasal Swab  Result Value Ref Range Status   MRSA by PCR Next Gen DETECTED (A) NOT DETECTED Final    Comment: (NOTE) The GeneXpert MRSA Assay (FDA approved for NASAL specimens only), is one component of a comprehensive MRSA colonization surveillance program. It is not intended to diagnose MRSA infection nor to guide or monitor treatment for MRSA infections. Test performance is not FDA approved in patients less than 26 years old. Performed at [facility], [address], [place], [place] [address]      Time coordinating discharge: 39 min SIGNED:   Almarie KANDICE Hoots, MD  [facility] 10/25/2024, 11:34 AM

## 2024-10-27 ENCOUNTER — Telehealth: Payer: Self-pay | Admitting: *Deleted

## 2024-10-27 NOTE — Transitions of Care (Post Inpatient/ED Visit) (Signed)
   10/27/2024  Name: [name] [name] MRN: [id number] DOB: 1951/02/09  Today's TOC FU Call Status: Today's TOC FU Call Status:: Unsuccessful Call (1st Attempt) Unsuccessful Call (1st Attempt) Date: 10/27/24  Attempted to reach the patient regarding the most recent Inpatient/ED visit.  Follow Up Plan: Additional outreach attempts will be made to reach the patient to complete the Transitions of Care (Post Inpatient/ED visit) call.   Andrea Dimes RN, BSN [place]  [facility] RN Care Manager [phone]

## 2024-10-27 NOTE — Transitions of Care (Post Inpatient/ED Visit) (Signed)
   10/27/2024  Name: [name] [name] MRN: [id number] DOB: 07-13-1951  Today's TOC FU Call Status: Today's TOC FU Call Status:: Unsuccessful Call (2nd Attempt) Unsuccessful Call (2nd Attempt) Date: 10/27/24  Attempted to reach the patient regarding the most recent Inpatient/ED visit.  Follow Up Plan: Additional outreach attempts will be made to reach the patient to complete the Transitions of Care (Post Inpatient/ED visit) call.   Andrea Dimes RN, BSN [place]  [facility] RN Care Manager [phone]

## 2024-10-29 ENCOUNTER — Ambulatory Visit: Payer: Self-pay

## 2024-10-29 NOTE — Telephone Encounter (Signed)
 FYI Only or Action Required?: Action required by provider: request for appointment. Advised ED.  Patient was last seen in primary care on 07/10/2024 by Newlin, Enobong, MD.  Called Nurse Triage reporting Tremors.  Symptoms began several days ago.  Interventions attempted: Nothing.  Symptoms are: unchanged.  Triage Disposition: See HCP Within 4 Hours (Or PCP Triage)  Patient/caregiver understands and will follow disposition?: Yes    Copied from CRM [id number]. Topic: Clinical - Red Word Triage >> Oct 29, 2024  2:32 PM Ivette P wrote: [facility] that prompted transfer to Nurse Triage: tremors right hand and arm, now starting on left Reason for Disposition  [1] New-onset muscle jerks AND [2] unexplained AND [3] 3 or more times per day  Answer Assessment - Initial Assessment Questions No available appts today. Advised ED now.   Patient requests appt with pcp.  1. APPEARANCE of MOVEMENT: What did the jerking or twitching look like? (e.g., body area)     Last Monday, could not write; episode last 10 minutes approx; bilateral arms and hands; nervous to pick up things. Last episode few minutes ago, occurred 3x times today. 2. ONSET: When did this start happening? (e.g., hours, days, weeks, months ago)     Monday tremors increased, 2 years ago, but now more frequent 3. DURATION: How long does the jerk, twitch, or spasm last?     Approx 10 min 4. FREQUENCY:  How often does this happen?      Last episode today; more severe on the right 5. WHEN: When does this happen? (e.g., while awake, while falling asleep, while sleeping)     Trying to do something then starts, not a specific time 6. CAUSE: What do you think caused the ?     unsure 7. OTHER SYMPTOMS: Are there any other symptoms? (e.g., fever, headache)     Denies dizziness, off balance, numbness/ weakness, HA, blurred vision,  Protocols used: Muscle Jerks - Tics - [facility]

## 2024-11-05 ENCOUNTER — Ambulatory Visit: Admitting: Podiatry

## 2024-11-11 ENCOUNTER — Encounter: Payer: Self-pay | Admitting: Family Medicine

## 2024-11-11 ENCOUNTER — Ambulatory Visit: Attending: Family Medicine | Admitting: Family Medicine

## 2024-11-11 VITALS — BP 168/75 | HR 60 | Temp 98.0°F | Ht 67.0 in | Wt 217.6 lb

## 2024-11-11 DIAGNOSIS — R251 Tremor, unspecified: Secondary | ICD-10-CM

## 2024-11-11 MED ORDER — AMLODIPINE BESYLATE 2.5 MG PO TABS: 2.5000 mg | ORAL_TABLET | Freq: Every day | ORAL | 1 refills | Status: DC

## 2024-11-11 NOTE — Progress Notes (Signed)
 Subjective:  Patient ID: [name] [name], male    DOB: [birth year]  Age: 73 y.o. MRN: [id number]  CC: Tremors (Tremors in both hands/)     Discussed the use of AI scribe software for clinical note transcription with the patient, who gave verbal consent to proceed.  History of Present Illness [name] [name] is a 73 year old male with a history of paroxysmal A. fib, atrial flutter, diastolic congestive heart failure (EF 55-60% in 11/2017), CAD (status post cardiac cath in 11/2015 - mid LAD 20% stenosis, dist LAD 40% stenosis, Ost RCA to prox RCA 30% stenosis, normal LV systolic function), sick sinus syndrome, COPD, GERD, seizures (previously managed by [facility]), BPH, hypogonadism (managed by endocrine),Gout  who presents with worsening tremors.  Tremors have progressively worsened, especially during activities like signing his name or reaching for objects. They are more pronounced with activity, primarily affecting the right hand, with occasional, less intense involvement of the left hand. No symptoms of weakness, facial drooping, difficulty walking, or speech problems. Family history includes similar tremors in his father and older brother. He is managed for seizures by a neurologist, Dr. Onita of [facility].  With current medications including albuterol , aspirin , and Lamictal . Recently hospitalized for pneumonia two weeks ago and reports elevated blood pressure readings over the past six weeks. He works as a media planner.    Past Medical History:  Diagnosis Date   Anemia    Asthma    Atrial fibrillation (HCC)    CHF (congestive heart failure) (HCC)    COPD (chronic obstructive pulmonary disease) (HCC)    Heart disease    Hypertension    Syncope     Past Surgical History:  Procedure Laterality Date   CARDIAC CATHETERIZATION N/A 12/08/2015   Procedure: Left Heart Cath and Coronary Angiography;  Surgeon: Rober Chroman, MD;  Location: [facility];  Service:  Cardiovascular;  Laterality: N/A;   ESOPHAGOSCOPY  2002   Rigid esophagoscopy and removal of esophageal foreign body.   FLEXIBLE SIGMOIDOSCOPY N/A 10/22/2024   Procedure: KINGSTON SIDE;  Surgeon: Charlanne Groom, MD;  Location: [facility];  Service: Gastroenterology;  Laterality: N/A;   FOREIGN BODY REMOVAL  10/22/2024   Procedure: REMOVAL, FOREIGN BODY;  Surgeon: Charlanne Groom, MD;  Location: [facility];  Service: Gastroenterology;;   FOREIGN BODY REMOVAL ESOPHAGEAL  2002   removal of swallowed chicken bone   SHOULDER CLOSED REDUCTION Left 12/01/2017   Procedure: CLOSED REDUCTION SHOULDER;  Surgeon: Celena Sharper, MD;  Location: [facility];  Service: Orthopedics;  Laterality: Left;    Family History  Problem Relation Age of Onset   Cancer Mother     Social History   Socioeconomic History   Marital status: Single    Spouse name: Not on file   Number of children: 1   Years of education: college   Highest education level: Associate degree: academic program  Occupational History   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    Employer: [employer]  Tobacco Use   Smoking status: Never   Smokeless tobacco: Never  Vaping Use   Vaping status: Never Used  Substance and Sexual Activity   Alcohol use: No    Alcohol/week: 0.0 standard drinks of alcohol   Drug use: No   Sexual activity: Not Currently  Other Topics Concern   Not on file  Social History Narrative   Patient lives at home alone and he is single.   Patient is semi retired.    Education  college.   Right handed.   Caffeine two cokes daily.   Social Drivers of Corporate Investment Banker Strain: Low Risk  (07/10/2024)   Overall Financial Resource Strain (CARDIA)    Difficulty of Paying Living Expenses: Not very hard  Recent Concern: Financial Resource Strain - Medium Risk (07/06/2024)   Overall Financial Resource Strain (CARDIA)    Difficulty of Paying Living Expenses: Somewhat hard  Food Insecurity: No Food Insecurity  (10/19/2024)   Hunger Vital Sign    Worried About Running Out of Food in the Last Year: Never true    Ran Out of Food in the Last Year: Never true  Transportation Needs: No Transportation Needs (10/19/2024)   PRAPARE - Administrator, Civil Service (Medical): No    Lack of Transportation (Non-Medical): No  Physical Activity: Insufficiently Active (07/06/2024)   Exercise Vital Sign    Days of Exercise per Week: 2 days    Minutes of Exercise per Session: 30 min  Stress: No Stress Concern Present (07/10/2024)   [employer] of Occupational Health - Occupational Stress Questionnaire    Feeling of Stress: Not at all  Social Connections: Moderately Integrated (10/19/2024)   Social Connection and Isolation Panel    Frequency of Communication with Friends and Family: Three times a week    Frequency of Social Gatherings with Friends and Family: Three times a week    Attends Religious Services: More than 4 times per year    Active Member of Clubs or Organizations: Patient unable to answer    Attends Banker Meetings: More than 4 times per year    Marital Status: Never married    No Known Allergies  Outpatient Medications Prior to Visit  Medication Sig Dispense Refill   acetaminophen  (TYLENOL ) 325 MG tablet Take 2 tablets (650 mg total) by mouth every 6 (six) hours as needed for mild pain (or Fever >/= 101). 30 tablet 0   albuterol  (PROVENTIL  HFA;VENTOLIN  HFA) 108 (90 Base) MCG/ACT inhaler Inhale 2 puffs into the lungs every 6 (six) hours as needed. 1 Inhaler 5   albuterol  (PROVENTIL ) (2.5 MG/3ML) 0.083% nebulizer solution Take 3 mLs (2.5 mg total) by nebulization every 6 (six) hours as needed for wheezing or shortness of breath. 150 mL 1   allopurinol  (ZYLOPRIM ) 300 MG tablet TAKE 1 TABLET BY MOUTH EVERY DAY 90 tablet 1   ARNUITY ELLIPTA  100 MCG/ACT AEPB Inhale 1 puff into the lungs daily. (Patient taking differently: Inhale 1 puff into the lungs daily as needed.) 30  each 3   aspirin  EC 81 MG tablet Take 81 mg by mouth daily as needed.     atorvastatin  (LIPITOR) 40 MG tablet Take 1 tablet (40 mg total) by mouth daily. 90 tablet 1   digoxin  (LANOXIN ) 0.125 MG tablet Take 1 tablet (0.125 mg total) by mouth daily. 30 tablet 3   empagliflozin  (JARDIANCE ) 10 MG TABS tablet Take 1 tablet (10 mg total) by mouth daily before breakfast. 90 tablet 1   ferrous sulfate  325 (65 FE) MG tablet Take 325 mg by mouth daily with breakfast. Reported on 03/16/2016     fluticasone  (FLONASE ) 50 MCG/ACT nasal spray Place 2 sprays into both nostrils daily. 16 g 6   furosemide  (LASIX ) 20 MG tablet TAKE 1 TABLET BY MOUTH EVERY DAY (Patient taking differently: Take 20 mg by mouth daily as needed.) 90 tablet 1   lamoTRIgine  (LAMICTAL ) 100 MG tablet TAKE 1 TABLET BY MOUTH TWICE A DAY 180  tablet 3   metoprolol  succinate (TOPROL -XL) 25 MG 24 hr tablet TAKE 1 TABLET (25 MG TOTAL) BY MOUTH DAILY. 90 tablet 1   nitroGLYCERIN  (NITROSTAT ) 0.4 MG SL tablet Place 1 tablet (0.4 mg total) under the tongue every 5 (five) minutes as needed for chest pain. 25 tablet 1   pantoprazole  (PROTONIX ) 40 MG tablet Take 1 tablet (40 mg total) by mouth daily. 90 tablet 1   tacrolimus (PROTOPIC) 0.1 % ointment Apply 1 Application topically 2 (two) times daily.     tamsulosin  (FLOMAX ) 0.4 MG CAPS capsule Take 1 capsule (0.4 mg total) by mouth daily. 90 capsule 1   No facility-administered medications prior to visit.     ROS Review of Systems  Constitutional:  Negative for activity change and appetite change.  HENT:  Negative for sinus pressure and sore throat.   Respiratory:  Negative for chest tightness, shortness of breath and wheezing.   Cardiovascular:  Negative for chest pain and palpitations.  Gastrointestinal:  Negative for abdominal distention, abdominal pain and constipation.  Genitourinary: Negative.   Musculoskeletal: Negative.   Neurological:  Positive for tremors.  Psychiatric/Behavioral:   Negative for behavioral problems and dysphoric mood.     Objective:  BP (!) 168/75   Pulse 60   Temp 98 F (36.7 C) (Oral)   Ht 5' 7 (1.702 m)   Wt 217 lb 9.6 oz (98.7 kg)   SpO2 100%   BMI 34.08 kg/m      11/11/2024    2:40 PM 11/11/2024    2:16 PM 10/24/2024    9:24 AM  BP/Weight  Systolic BP 168 164 159  Diastolic BP 75 76 85  Wt. (Lbs)  217.6   BMI  34.08 kg/m2       Physical Exam Constitutional:      Appearance: He is well-developed.  Cardiovascular:     Rate and Rhythm: Normal rate.     Heart sounds: Normal heart sounds. No murmur heard. Pulmonary:     Effort: Pulmonary effort is normal.     Breath sounds: Normal breath sounds. No wheezing or rales.  Chest:     Chest wall: No tenderness.  Abdominal:     General: Bowel sounds are normal. There is no distension.     Palpations: Abdomen is soft. There is no mass.     Tenderness: There is no abdominal tenderness.  Musculoskeletal:        General: Normal range of motion.     Right lower leg: No edema.     Left lower leg: No edema.  Neurological:     Mental Status: He is alert and oriented to person, place, and time.     Coordination: Coordination abnormal (slight tremor of the R outstretched arm).  Psychiatric:        Mood and Affect: Mood normal.        Latest Ref Rng & Units 10/23/2024    9:06 AM 10/22/2024    7:48 AM 10/21/2024    7:41 AM  CMP  Glucose 70 - 99 mg/dL 86  88  83   BUN 8 - 23 mg/dL 5  6  11    Creatinine 0.61 - 1.24 mg/dL 8.56  8.46  8.37   Sodium 135 - 145 mmol/L 138  139  137   Potassium 3.5 - 5.1 mmol/L 3.8  4.0  3.8   Chloride 98 - 111 mmol/L 104  106  104   CO2 22 - 32 mmol/L 22  25  24   Calcium  8.9 - 10.3 mg/dL 8.8  8.7  8.9     Lipid Panel     Component Value Date/Time   CHOL 146 10/07/2021 1017   TRIG 85 10/07/2021 1017   HDL 45 10/07/2021 1017   CHOLHDL 3.2 10/07/2021 1017   CHOLHDL 2.5 12/09/2015 0133   VLDL 11 12/09/2015 0133   LDLCALC 85 10/07/2021 1017     CBC    Component Value Date/Time   WBC 7.5 10/23/2024 0906   RBC 4.55 10/23/2024 0906   HGB 10.1 (L) 10/23/2024 0906   HGB 11.5 (L) 03/08/2022 1215   HCT 33.4 (L) 10/23/2024 0906   HCT 37.6 03/08/2022 1215   PLT 300 10/23/2024 0906   PLT 357 03/08/2022 1215   MCV 73.4 (L) 10/23/2024 0906   MCV 71 (L) 03/08/2022 1215   MCH 22.2 (L) 10/23/2024 0906   MCHC 30.2 10/23/2024 0906   RDW 16.3 (H) 10/23/2024 0906   RDW 16.0 (H) 03/08/2022 1215   LYMPHSABS 1.1 10/22/2024 0748   LYMPHSABS 2.4 03/08/2022 1215   MONOABS 0.9 10/22/2024 0748   EOSABS 0.5 10/22/2024 0748   EOSABS 0.6 (H) 03/08/2022 1215   BASOSABS 0.1 10/22/2024 0748   BASOSABS 0.1 03/08/2022 1215    Lab Results  Component Value Date   HGBA1C 5.3 10/20/2024       Assessment & Plan Tremor, primarily right hand Tremor in right hand, possible Parkinson's disease or thyroid  abnormalities. Family history noted. Neurology evaluation needed. - Referred to neurology for tremor evaluation and potential Parkinson's disease. - Ordered thyroid  function test.  Hypertension Consistently elevated blood pressure over the last 6 weeks and also elevated on repeat.  Possible anxiety contribution. - Added low dose amlodipine 2.5 mg - Scheduled follow-up for blood pressure reassessment. -Counseled on blood pressure goal of less than 130/80, low-sodium, DASH diet, medication compliance, 150 minutes of moderate intensity exercise per week. Discussed medication compliance, adverse effects.       Meds ordered this encounter  Medications   amLODipine (NORVASC) 2.5 MG tablet    Sig: Take 1 tablet (2.5 mg total) by mouth daily.    Dispense:  90 tablet    Refill:  1    Follow-up: Return for previously scheduled appointment.       Corrina Sabin, MD, FAAFP. [facility] [place], [place] [phone]   11/11/2024, 4:55 PM

## 2024-12-11 ENCOUNTER — Ambulatory Visit: Attending: Family Medicine

## 2024-12-11 ENCOUNTER — Encounter: Payer: Self-pay | Admitting: Podiatry

## 2024-12-11 ENCOUNTER — Ambulatory Visit: Admitting: Podiatry

## 2024-12-11 VITALS — Ht 67.0 in | Wt 217.6 lb

## 2024-12-11 DIAGNOSIS — M79674 Pain in right toe(s): Secondary | ICD-10-CM

## 2024-12-11 DIAGNOSIS — B351 Tinea unguium: Secondary | ICD-10-CM | POA: Diagnosis not present

## 2024-12-11 DIAGNOSIS — M79675 Pain in left toe(s): Secondary | ICD-10-CM

## 2024-12-11 NOTE — Progress Notes (Signed)
 This patient presents to the office with chief complaint of long thick painful nails.  Patient says the nails are painful walking and wearing shoes.  This patient is unable to self treat.  This patient is unable to trim hisnails since she is unable to reach his nails.  he presents to the office for preventative foot care services.  General Appearance  Alert, conversant and in no acute stress.  Vascular  Dorsalis pedis and posterior tibial  pulses are palpable  bilaterally.  Capillary return is within normal limits  bilaterally. Temperature is within normal limits  bilaterally.  Neurologic  Senn-Weinstein monofilament wire test within normal limits  bilaterally. Muscle power within normal limits bilaterally.  Nails Thick disfigured discolored nails with subungual debris  from hallux to fifth toes bilaterally. No evidence of bacterial infection or drainage bilaterally.  Orthopedic  No limitations of motion  feet .  No crepitus or effusions noted.  No bony pathology or digital deformities noted.  Skin  normotropic skin with no porokeratosis noted bilaterally.  No signs of infections or ulcers noted.     Onychomycosis  Nails  B/L.  Pain in right toes  Pain in left toes  Debridement of nails both feet followed trimming the nails with dremel tool.    RTC 3 months.   Helane Gunther DPM

## 2024-12-12 ENCOUNTER — Ambulatory Visit: Payer: Self-pay | Admitting: Family Medicine

## 2024-12-12 LAB — TSH: TSH: 1.48 u[IU]/mL (ref 0.450–4.500)

## 2024-12-12 LAB — T3: T3, Total: 144 ng/dL (ref 71–180)

## 2024-12-12 LAB — T4, FREE: Free T4: 1.04 ng/dL (ref 0.82–1.77)

## 2024-12-15 ENCOUNTER — Encounter: Payer: Self-pay | Admitting: Gastroenterology

## 2024-12-15 ENCOUNTER — Ambulatory Visit: Admitting: Gastroenterology

## 2024-12-15 VITALS — BP 132/60 | HR 60 | Ht 67.0 in | Wt 213.0 lb

## 2024-12-15 DIAGNOSIS — K51518 Left sided colitis with other complication: Secondary | ICD-10-CM | POA: Diagnosis not present

## 2024-12-15 DIAGNOSIS — T184XXA Foreign body in colon, initial encounter: Secondary | ICD-10-CM | POA: Diagnosis not present

## 2024-12-15 DIAGNOSIS — K529 Noninfective gastroenteritis and colitis, unspecified: Secondary | ICD-10-CM

## 2024-12-15 MED ORDER — GOLYTELY 236 G PO SOLR: 4000.0000 mL | Freq: Once | ORAL | 0 refills | Status: AC

## 2024-12-15 NOTE — Progress Notes (Signed)
 "     [facility] Progress Note  Chief Complaint: Follow-up of sigmoid colitis related to foreign body  Subjective  Prior history  Seen in office by Dr. Legrand 2017 for GERD without red flag signs Seen again in office 2019 regarding colorectal cancer screening.  Patient was not sure he would have a care partner for colonoscopy and opted for Cologuard, but did not complete.  Hospitalized late October 2025 for abdominal pain and CT abdomen pelvis showing inflammation and contained perforation of the sigmoid from apparent foreign body in that area. Was evaluated by general surgery by Dr. Charlanne of our practice. Sigmoidoscopy 10/22/2024 was able to remove the chicken bone lodged transversely in the transverse colon.  Outpatient colonoscopy recommended in 6 to 8 weeks.  (With 2-day bowel prep)  D/c summary mentions A fi but no OAC.  Amiodarone  stopped at discharge  Patient reports seeing Dr Levern (Cardiology) every 3-4 months History of Present Illness  [name] [name] says his abdominal pain has resolved, his bowel habits are generally regular, but typically about 4 to 5/week.  Denies rectal bleeding.  He does not recall having swallowed a chicken bone 2 from the episode noted above, but says he is trying to be more careful.  If he has had a prior colonoscopy, it sounds like it was probably decades ago and predates this EHR.  He has reported history of A-fib but is not on oral anticoagulation.  He was uncertain about all of his medicines, but does not think he is on a blood thinner at this point even after reviewing all of his meds today.  He reports seeing Dr. Levern of cardiology every several months, though those office visits are not visible in this EHR. He denies chest pain at rest or with exertion and denies dyspnea or lightheadedness.  No known family history of colorectal cancer  ROS: Cardiovascular:  no chest pain Respiratory: no dyspnea or cough. Says he can go out to get the mail or go  up a flight of stairs without getting chest pain or dyspnea.  The patient's Past Medical, Family and Social History were reviewed and are on file in the EMR. Past Medical History:  Diagnosis Date   Anemia    Asthma    Atrial fibrillation (HCC)    CHF (congestive heart failure) (HCC)    COPD (chronic obstructive pulmonary disease) (HCC)    Heart disease    Hypertension    Syncope     Past Surgical History:  Procedure Laterality Date   CARDIAC CATHETERIZATION N/A 12/08/2015   Procedure: Left Heart Cath and Coronary Angiography;  Surgeon: Rober Levern, MD;  Location: [facility];  Service: Cardiovascular;  Laterality: N/A;   ESOPHAGOSCOPY  2002   Rigid esophagoscopy and removal of esophageal foreign body.   FLEXIBLE SIGMOIDOSCOPY N/A 10/22/2024   Procedure: KINGSTON SIDE;  Surgeon: Charlanne Groom, MD;  Location: [facility];  Service: Gastroenterology;  Laterality: N/A;   FOREIGN BODY REMOVAL  10/22/2024   Procedure: REMOVAL, FOREIGN BODY;  Surgeon: Charlanne Groom, MD;  Location: [facility];  Service: Gastroenterology;;   FOREIGN BODY REMOVAL ESOPHAGEAL  2002   removal of swallowed chicken bone   SHOULDER CLOSED REDUCTION Left 12/01/2017   Procedure: CLOSED REDUCTION SHOULDER;  Surgeon: Celena Sharper, MD;  Location: [facility];  Service: Orthopedics;  Laterality: Left;     Objective:  Med list reviewed Current Medications[1]   Vital signs in last 24 hrs: Vitals:   12/15/24 1335  BP: 132/60  Pulse: 60   Wt Readings from Last 3 Encounters:  12/15/24 213 lb (96.6 kg)  12/11/24 217 lb 9.6 oz (98.7 kg)  11/11/24 217 lb 9.6 oz (98.7 kg)    Physical Exam  Well-appearing.  Breathing comfortably on room air, ambulatory, gets on exam table without assistance HEENT: sclera anicteric, oral mucosa moist without lesions Neck: supple, no thyromegaly, JVD or lymphadenopathy Cardiac: Regular with no appreciable murmur, mild pretibial edema bilaterally Pulm: clear to  auscultation bilaterally, normal RR and effort noted Abdomen: soft, no tenderness, with active bowel sounds. No guarding or palpable hepatosplenomegaly. Skin; warm and dry, no jaundice or rash   Labs:     Latest Ref Rng & Units 10/23/2024    9:06 AM 10/22/2024    7:48 AM 10/21/2024    7:41 AM  CBC  WBC 4.0 - 10.5 K/uL 7.5  7.4  9.8   Hemoglobin 13.0 - 17.0 g/dL 89.8  9.3  9.5   Hematocrit 39.0 - 52.0 % 33.4  31.9  31.6   Platelets 150 - 400 K/uL 300  298  280       Latest Ref Rng & Units 10/23/2024    9:06 AM 10/22/2024    7:48 AM 10/21/2024    7:41 AM  CMP  Glucose 70 - 99 mg/dL 86  88  83   BUN 8 - 23 mg/dL 5  6  11    Creatinine 0.61 - 1.24 mg/dL 8.56  8.46  8.37   Sodium 135 - 145 mmol/L 138  139  137   Potassium 3.5 - 5.1 mmol/L 3.8  4.0  3.8   Chloride 98 - 111 mmol/L 104  106  104   CO2 22 - 32 mmol/L 22  25  24    Calcium  8.9 - 10.3 mg/dL 8.8  8.7  8.9    Iron/TIBC/Ferritin/ %Sat    Component Value Date/Time   IRON 47 10/19/2024 1021   TIBC 203 (L) 10/19/2024 1021   FERRITIN 110 10/19/2024 1021   IRONPCTSAT 23 10/19/2024 1021     ___________________________________________ Radiologic studies: CT ABDOMEN AND PELVIS WITHOUT CONTRAST 10/18/2024 10:43:25 PM   TECHNIQUE: CT of the abdomen and pelvis was performed without the administration of intravenous contrast. Multiplanar reformatted images are provided for review. Automated exposure control, iterative reconstruction, and/or weight-based adjustment of the mA/kV was utilized to reduce the radiation dose to as low as reasonably achievable.   COMPARISON: None available.   CLINICAL HISTORY: Diverticulitis, complication suspected.   FINDINGS:   LOWER CHEST: Patchy bibasilar airspace infiltrates are present in keeping with multifocal infection in the acute setting.   LIVER: The liver is unremarkable.   GALLBLADDER AND BILE DUCTS: Gallbladder is unremarkable. No biliary ductal dilatation. A small  hiatal hernia is present.   SPLEEN: No acute abnormality.   PANCREAS: No acute abnormality.   ADRENAL GLANDS: No acute abnormality.   KIDNEYS, URETERS AND BLADDER: No stones in the kidneys or ureters. No hydronephrosis. No perinephric or periureteral stranding. Urinary bladder is unremarkable.   GI AND BOWEL: A wire-like metallic foreign body measuring roughly 6 cm in length has perforated the wall of the sigmoid colon at both ends with moderate surrounding inflammatory change identified. This is best seen on axial images 64/2 through 69/2. There is moderate background descending and sigmoid colonic diverticulosis. No free intraperitoneal gas or fluid. No pericolonic fluid collection identified. No evidence of obstruction. The stomach, small bowel, and large bowel are otherwise unremarkable. The appendix is normal.  PERITONEUM AND RETROPERITONEUM: No ascites. No free air.   VASCULATURE: Mild aortoiliac atherosclerotic calcification. No aortic aneurysm.   LYMPH NODES: No lymphadenopathy.   REPRODUCTIVE ORGANS: No acute abnormality.   BONES AND SOFT TISSUES: Moderate right and severe left hip degenerative arthritis. Degenerative changes are seen within the visualized thoracolumbar spine. No acute bone abnormality. No lytic or blastic bone lesion.   IMPRESSION: 1. Wire-like metallic intraluminal foreign body (~6 cm) perforating the sigmoid colon at both ends with moderate surrounding inflammatory change. No free intraperitoneal gas or fluid, no pericolonic fluid collection, and no bowel obstruction. Surgical consultation recommended. 2. Moderate descending and sigmoid diverticulosis. 3. Patchy bibasilar airspace infiltrates consistent with acute multifocal infection.   Electronically signed by: Dorethia Molt MD 10/18/2024 10:55 PM EDT RP Workstation: [id number]  _______________  CLINICAL DATA:  Evaluate for perforation post flexible sigmoidoscopy. Chicken bone  removed from left colon.   EXAM: CT ABDOMEN AND PELVIS WITHOUT CONTRAST   TECHNIQUE: Multidetector CT imaging of the abdomen and pelvis was performed following the standard protocol without IV contrast.   RADIATION DOSE REDUCTION: This exam was performed according to the departmental dose-optimization program which includes automated exposure control, adjustment of the mA and/or kV according to patient size and/or use of iterative reconstruction technique.   COMPARISON:  10/21/2024, 10/18/2024   FINDINGS: Lower chest: Heart is normal size. Possible small sliding hiatal hernia. Visualized lung bases are clear.   Hepatobiliary: Liver, gallbladder and biliary tree are normal.   Pancreas: Normal.   Spleen: Normal.   Adrenals/Urinary Tract: Adrenal glands are normal. Kidneys are normal in size without hydronephrosis or nephrolithiasis. Ureters and bladder are normal.   Stomach/Bowel: Mild distention of the stomach with fluid/contrast. Small bowel is unremarkable. Appendix is normal. Mild diverticulosis of the colon. Contrast present within the colon to the level of the sigmoid colon. Previously seen foreign body/chin bone over the sigmoid colon in the left lower quadrant is no longer visualized and compatible with recent removal. Minimal density projects from the right lateral wall of the sigmoid colon in the left lower quadrant in the area the chicken bone had previously perforated. No current evidence of free air. No residual foreign body present. Remainder of the colon is unremarkable.   Vascular/Lymphatic: Abdominal aorta is normal caliber. No adenopathy.   Reproductive: Prostate is unremarkable.   Other: Small amount of free fluid in the pelvis.   Musculoskeletal: No focal abnormality.   IMPRESSION: 1. No acute findings in the abdomen/pelvis. Previously seen foreign body/chicken bone over the sigmoid colon in the left lower quadrant is no longer visualized  compatible with recent removal. No residual foreign body present. No free peritoneal air. 2. Mild diverticulosis of the colon. 3. Small amount of free fluid in the pelvis.     Electronically Signed   By: Toribio Agreste M.D.   On: 10/23/2024 10:56  (Images of CT scan personally reviewed during clinic visit-H Dan[facility]) ____________________________________________ Other:  Nml LVEF and no reported significant valve abnormalities 2018 (? More recent echo at [facility]wani's office) _____________________________________________   Encounter Diagnoses  Name Primary?   Foreign body in colon, initial encounter Yes   Colitis presumed to be due to infection   Left-sided colitis related to this foreign body  Assessment & Plan  Follow-up colonoscopy required after this recent episode, particularly because the patient has not had a full colonoscopy in decades.  History of A-fib, regular on exam today and not on oral anticoagulation.  His amiodarone  was stopped after  the most recent hospitalization has noted in the discharge summary.  I recommended that he have a colonoscopy, and he was agreeable after thorough discussion of procedure and risks.  The benefits and risks of the planned procedure(s) were described in detail with the patient or (when appropriate) their health care proxy.  Risks were outlined as including, but not limited to, bleeding, infection, perforation, adverse medication reaction leading to cardiac or pulmonary decompensation, pancreatitis (if ERCP).  The limitation of incomplete mucosal visualization was also discussed.  No guarantees or warranties were given.  I believe he can have this procedure in our outpatient endoscopy lab.  We have requested most recent office visit note and echocardiogram report from Dr. Levern for eventual review by me and our endoscopy anesthesia provider. Fortunately, the patient does not appear to have any cardiopulmonary symptoms at present.  I spent total  of 42 minutes in both face-to-face (30 minutes interview/exam) and non-face-to-face (12 minutes chart review, care coordination, documentation)  activities, excluding procedures performed, for the visit on the date of this encounter.    [name] [name]     [1]  Current Outpatient Medications:    acetaminophen  (TYLENOL ) 325 MG tablet, Take 2 tablets (650 mg total) by mouth every 6 (six) hours as needed for mild pain (or Fever >/= 101)., Disp: 30 tablet, Rfl: 0   albuterol  (PROVENTIL  HFA;VENTOLIN  HFA) 108 (90 Base) MCG/ACT inhaler, Inhale 2 puffs into the lungs every 6 (six) hours as needed., Disp: 1 Inhaler, Rfl: 5   albuterol  (PROVENTIL ) (2.5 MG/3ML) 0.083% nebulizer solution, Take 3 mLs (2.5 mg total) by nebulization every 6 (six) hours as needed for wheezing or shortness of breath., Disp: 150 mL, Rfl: 1   allopurinol  (ZYLOPRIM ) 300 MG tablet, TAKE 1 TABLET BY MOUTH EVERY DAY, Disp: 90 tablet, Rfl: 1   amLODipine  (NORVASC ) 2.5 MG tablet, Take 1 tablet (2.5 mg total) by mouth daily., Disp: 90 tablet, Rfl: 1   ARNUITY ELLIPTA  100 MCG/ACT AEPB, Inhale 1 puff into the lungs daily. (Patient taking differently: Inhale 1 puff into the lungs daily as needed.), Disp: 30 each, Rfl: 3   aspirin  EC 81 MG tablet, Take 81 mg by mouth daily as needed., Disp: , Rfl:    atorvastatin  (LIPITOR) 40 MG tablet, Take 1 tablet (40 mg total) by mouth daily., Disp: 90 tablet, Rfl: 1   digoxin  (LANOXIN ) 0.125 MG tablet, Take 1 tablet (0.125 mg total) by mouth daily., Disp: 30 tablet, Rfl: 3   empagliflozin  (JARDIANCE ) 10 MG TABS tablet, Take 1 tablet (10 mg total) by mouth daily before breakfast., Disp: 90 tablet, Rfl: 1   ferrous sulfate  325 (65 FE) MG tablet, Take 325 mg by mouth daily with breakfast. Reported on 03/16/2016, Disp: , Rfl:    fluticasone  (FLONASE ) 50 MCG/ACT nasal spray, Place 2 sprays into both nostrils daily., Disp: 16 g, Rfl: 6   furosemide  (LASIX ) 20 MG tablet, TAKE 1 TABLET BY MOUTH EVERY DAY  (Patient taking differently: Take 20 mg by mouth daily as needed.), Disp: 90 tablet, Rfl: 1   lamoTRIgine  (LAMICTAL ) 100 MG tablet, TAKE 1 TABLET BY MOUTH TWICE A DAY, Disp: 180 tablet, Rfl: 3   metoprolol  succinate (TOPROL -XL) 25 MG 24 hr tablet, TAKE 1 TABLET (25 MG TOTAL) BY MOUTH DAILY., Disp: 90 tablet, Rfl: 1   nitroGLYCERIN  (NITROSTAT ) 0.4 MG SL tablet, Place 1 tablet (0.4 mg total) under the tongue every 5 (five) minutes as needed for chest pain., Disp: 25 tablet, Rfl: 1  pantoprazole  (PROTONIX ) 40 MG tablet, Take 1 tablet (40 mg total) by mouth daily., Disp: 90 tablet, Rfl: 1   polyethylene glycol (GOLYTELY ) 236 g solution, Take 4,000 mLs by mouth once for 1 dose., Disp: 4000 mL, Rfl: 0   tacrolimus (PROTOPIC) 0.1 % ointment, Apply 1 Application topically 2 (two) times daily., Disp: , Rfl:    tamsulosin  (FLOMAX ) 0.4 MG CAPS capsule, Take 1 capsule (0.4 mg total) by mouth daily., Disp: 90 capsule, Rfl: 1  "

## 2024-12-15 NOTE — Patient Instructions (Signed)
 You have been scheduled for a colonoscopy. Please follow written instructions given to you at your visit today.   If you use inhalers (even only as needed), please bring them with you on the day of your procedure.  DO NOT TAKE 7 DAYS PRIOR TO TEST- Trulicity (dulaglutide) Ozempic, Wegovy (semaglutide) Mounjaro, Zepbound (tirzepatide) Bydureon Bcise (exanatide extended release)  DO NOT TAKE 1 DAY PRIOR TO YOUR TEST Rybelsus (semaglutide) Adlyxin (lixisenatide) Victoza (liraglutide) Byetta (exanatide) ___________________________________________________________________________  Thank you for trusting me with your gastrointestinal care!    Dr. Victory Legrand [facility]

## 2024-12-26 ENCOUNTER — Other Ambulatory Visit: Payer: Self-pay | Admitting: Neurology

## 2024-12-26 ENCOUNTER — Other Ambulatory Visit: Payer: Self-pay | Admitting: Family Medicine

## 2024-12-26 DIAGNOSIS — K219 Gastro-esophageal reflux disease without esophagitis: Secondary | ICD-10-CM

## 2024-12-26 NOTE — Telephone Encounter (Signed)
 Requested Prescriptions  Pending Prescriptions Disp Refills   pantoprazole  (PROTONIX ) 40 MG tablet [Pharmacy Med Name: PANTOPRAZOLE  SOD DR 40 MG TAB] 90 tablet 0    Sig: TAKE 1 TABLET BY MOUTH EVERY DAY     Gastroenterology: Proton Pump Inhibitors Passed - 12/26/2024  1:50 PM      Passed - Valid encounter within last 12 months    Recent Outpatient Visits           1 month ago Tremor   [facility] [place]. [facility] [name], MD   5 months ago Tinnitus of right ear   [facility] [place]. [facility] [name], MD   11 months ago Suspected sleep apnea   [employer] [facility] [facility] [place]. [facility] [name], MD   1 year ago Hypertensive heart disease with chronic diastolic congestive heart failure [facility])   [place] [facility] [place]. [facility] [name], MD   1 year ago Hypertensive heart disease with chronic diastolic congestive heart failure [facility])    [facility] [place]. [facility] Fleeta Tonia Garnette LITTIE, RPH-CPP       Future Appointments             In 5 months [place], Adine PARAS., MD [facility] at [facility]

## 2024-12-29 NOTE — Telephone Encounter (Signed)
 Last seen on 09/18/23 Follow up scheduled on 04/09/25

## 2025-01-06 ENCOUNTER — Other Ambulatory Visit: Payer: Self-pay | Admitting: Family Medicine

## 2025-01-06 ENCOUNTER — Ambulatory Visit: Attending: Family Medicine

## 2025-01-06 VITALS — BP 127/87 | HR 97 | Temp 98.2°F | Resp 16 | Ht 67.0 in | Wt 205.8 lb

## 2025-01-06 DIAGNOSIS — Z0000 Encounter for general adult medical examination without abnormal findings: Secondary | ICD-10-CM

## 2025-01-06 DIAGNOSIS — I10 Essential (primary) hypertension: Secondary | ICD-10-CM

## 2025-01-06 NOTE — Patient Instructions (Signed)
 [name] [name],  Thank you for taking the time for your Medicare Wellness Visit. I appreciate your continued commitment to your health goals. Please review the care plan we discussed, and feel free to reach out if I can assist you further.  Please note that Annual Wellness Visits do not include a physical exam. Some assessments may be limited, especially if the visit was conducted virtually. If needed, we may recommend an in-person follow-up with your provider.  Ongoing Care Seeing your primary care provider every 3 to 6 months helps us  monitor your health and provide consistent, personalized care.   Referrals If a referral was made during today's visit and you haven't received any updates within two weeks, please contact the referred provider directly to check on the status.  Recommended Screenings:  Health Maintenance  Topic Date Due   Yearly kidney health urinalysis for diabetes  Never done   Colon Cancer Screening  Never done   Medicare Annual Wellness Visit  03/25/2024   COVID-19 Vaccine (1 - 2025-26 season) Never done   Zoster (Shingles) Vaccine (1 of 2) 02/11/2025*   Flu Shot  03/24/2025*   Yearly kidney function blood test for diabetes  10/23/2025   Pneumococcal Vaccine for age over 82  Completed   Hepatitis C Screening  Completed   Meningitis B Vaccine  Aged Out   DTaP/Tdap/Td vaccine  Discontinued  *Topic was postponed. The date shown is not the original due date.       01/02/2025    8:57 AM  Advanced Directives  Does Patient Have a Medical Advance Directive? No  Would patient like information on creating a medical advance directive? No - Patient declined    Vision: Annual vision screenings are recommended for early detection of glaucoma, cataracts, and diabetic retinopathy. These exams can also reveal signs of chronic conditions such as diabetes and high blood pressure.  Dental: Annual dental screenings help detect early signs of oral cancer, gum disease, and other  conditions linked to overall health, including heart disease and diabetes.  Please see the attached documents for additional preventive care recommendations.

## 2025-01-06 NOTE — Progress Notes (Signed)
 "  Chief Complaint  Patient presents with   Medicare Wellness    SUBSEQUENT     Subjective:   [name] [name] is a 74 y.o. male who presents for a Medicare Annual Wellness Visit.  Visit info / Clinical Intake: Medicare Wellness Visit Type:: Subsequent Annual Wellness Visit Persons participating in visit and providing information:: patient Medicare Wellness Visit Mode:: In-person (required for WTM) Interpreter Needed?: No Pre-visit prep was completed: yes AWV questionnaire completed by patient prior to visit?: yes Date:: 01/02/25 Living arrangements:: (!) (Patient-Rptd) lives alone Patient's Overall Health Status Rating: (Patient-Rptd) good Typical amount of pain: (Patient-Rptd) none Does pain affect daily life?: (Patient-Rptd) no Are you currently prescribed opioids?: no  Dietary Habits and Nutritional Risks How many meals a day?: (Patient-Rptd) 2 Eats fruit and vegetables daily?: (Patient-Rptd) yes Most meals are obtained by: (Patient-Rptd) preparing own meals In the last 2 weeks, have you had any of the following?: none Diabetic:: no  Functional Status Activities of Daily Living (to include ambulation/medication): (Patient-Rptd) Independent Ambulation: (Patient-Rptd) Independent Medication Administration: (Patient-Rptd) Independent Home Management (perform basic housework or laundry): (Patient-Rptd) Independent Manage your own finances?: (Patient-Rptd) yes Primary transportation is: (Patient-Rptd) driving Concerns about vision?: no *vision screening is required for WTM* Concerns about hearing?: (!) yes (buzzing sound) Uses hearing aids?: no Hear whispered voice?: yes  Fall Screening Falls in the past year?: (Patient-Rptd) 0 Number of falls in past year: 0 Was there an injury with Fall?: 0 Fall Risk Category Calculator: 0 Patient Fall Risk Level: Low Fall Risk  Fall Risk Patient at Risk for Falls Due to: No Fall Risks Fall risk Follow up: Falls evaluation  completed; Education provided  Home and Transportation Safety: All rugs have non-skid backing?: (Patient-Rptd) yes All stairs or steps have railings?: (Patient-Rptd) N/A, no stairs Grab bars in the bathtub or shower?: (!) (Patient-Rptd) no Have non-skid surface in bathtub or shower?: (Patient-Rptd) yes Good home lighting?: (Patient-Rptd) yes Regular seat belt use?: (!) (Patient-Rptd) no Hospital stays in the last year:: (!) (Patient-Rptd) yes How many hospital stays:: (Patient-Rptd) 6  Cognitive Assessment Difficulty concentrating, remembering, or making decisions? : (Patient-Rptd) no Will 6CIT or Mini Cog be Completed: yes What year is it?: 0 points What month is it?: 0 points Give patient an address phrase to remember (5 components): [address] [place], [place] About what time is it?: 0 points Count backwards from 20 to 1: 0 points Say the months of the year in reverse: 0 points Repeat the address phrase from earlier: 0 points 6 CIT Score: 0 points  Advance Directives (For Healthcare) Does Patient Have a Medical Advance Directive?: No Would patient like information on creating a medical advance directive?: No - Patient declined (Pending)  Reviewed/Updated  Reviewed/Updated: Reviewed All (Medical, Surgical, Family, Medications, Allergies, Care Teams, Patient Goals)    Allergies (verified) Patient has no known allergies.   Current Medications (verified) Outpatient Encounter Medications as of 01/06/2025  Medication Sig   acetaminophen  (TYLENOL ) 325 MG tablet Take 2 tablets (650 mg total) by mouth every 6 (six) hours as needed for mild pain (or Fever >/= 101).   albuterol  (PROVENTIL  HFA;VENTOLIN  HFA) 108 (90 Base) MCG/ACT inhaler Inhale 2 puffs into the lungs every 6 (six) hours as needed.   albuterol  (PROVENTIL ) (2.5 MG/3ML) 0.083% nebulizer solution Take 3 mLs (2.5 mg total) by nebulization every 6 (six) hours as needed for wheezing or shortness of breath.   allopurinol   (ZYLOPRIM ) 300 MG tablet TAKE 1 TABLET BY MOUTH EVERY  DAY   amLODipine  (NORVASC ) 2.5 MG tablet Take 1 tablet (2.5 mg total) by mouth daily.   ARNUITY ELLIPTA  100 MCG/ACT AEPB Inhale 1 puff into the lungs daily. (Patient taking differently: Inhale 1 puff into the lungs daily as needed.)   aspirin  EC 81 MG tablet Take 81 mg by mouth daily as needed.   atorvastatin  (LIPITOR) 40 MG tablet Take 1 tablet (40 mg total) by mouth daily.   digoxin  (LANOXIN ) 0.125 MG tablet Take 1 tablet (0.125 mg total) by mouth daily.   empagliflozin  (JARDIANCE ) 10 MG TABS tablet Take 1 tablet (10 mg total) by mouth daily before breakfast.   ferrous sulfate  325 (65 FE) MG tablet Take 325 mg by mouth daily with breakfast. Reported on 03/16/2016   fluticasone  (FLONASE ) 50 MCG/ACT nasal spray Place 2 sprays into both nostrils daily.   furosemide  (LASIX ) 20 MG tablet TAKE 1 TABLET BY MOUTH EVERY DAY (Patient taking differently: Take 20 mg by mouth daily as needed.)   lamoTRIgine  (LAMICTAL ) 100 MG tablet TAKE 1 TABLET BY MOUTH TWICE A DAY   nitroGLYCERIN  (NITROSTAT ) 0.4 MG SL tablet Place 1 tablet (0.4 mg total) under the tongue every 5 (five) minutes as needed for chest pain.   pantoprazole  (PROTONIX ) 40 MG tablet TAKE 1 TABLET BY MOUTH EVERY DAY   tacrolimus (PROTOPIC) 0.1 % ointment Apply 1 Application topically 2 (two) times daily.   tamsulosin  (FLOMAX ) 0.4 MG CAPS capsule Take 1 capsule (0.4 mg total) by mouth daily.   [DISCONTINUED] metoprolol  succinate (TOPROL -XL) 25 MG 24 hr tablet TAKE 1 TABLET (25 MG TOTAL) BY MOUTH DAILY.   No facility-administered encounter medications on file as of 01/06/2025.    History: Past Medical History:  Diagnosis Date   Anemia    Asthma    Atrial fibrillation (HCC)    CHF (congestive heart failure) (HCC)    COPD (chronic obstructive pulmonary disease) (HCC)    Heart disease    Hypertension    Syncope    Past Surgical History:  Procedure Laterality Date   CARDIAC CATHETERIZATION  N/A 12/08/2015   Procedure: Left Heart Cath and Coronary Angiography;  Surgeon: Rober Chroman, MD;  Location: [facility];  Service: Cardiovascular;  Laterality: N/A;   ESOPHAGOSCOPY  2002   Rigid esophagoscopy and removal of esophageal foreign body.   FLEXIBLE SIGMOIDOSCOPY N/A 10/22/2024   Procedure: KINGSTON SIDE;  Surgeon: Charlanne Groom, MD;  Location: [facility];  Service: Gastroenterology;  Laterality: N/A;   FOREIGN BODY REMOVAL  10/22/2024   Procedure: REMOVAL, FOREIGN BODY;  Surgeon: Charlanne Groom, MD;  Location: [facility];  Service: Gastroenterology;;   FOREIGN BODY REMOVAL ESOPHAGEAL  2002   removal of swallowed chicken bone   SHOULDER CLOSED REDUCTION Left 12/01/2017   Procedure: CLOSED REDUCTION SHOULDER;  Surgeon: Celena Sharper, MD;  Location: [facility];  Service: Orthopedics;  Laterality: Left;   Family History  Problem Relation Age of Onset   Cancer Mother    Social History   Occupational History   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    Employer: [employer]  Tobacco Use   Smoking status: Never   Smokeless tobacco: Never  Vaping Use   Vaping status: Never Used  Substance and Sexual Activity   Alcohol use: No    Alcohol/week: 0.0 standard drinks of alcohol   Drug use: No   Sexual activity: Not Currently   Tobacco Counseling Counseling given: Not Answered  SDOH Screenings   Food Insecurity: No Food Insecurity (01/06/2025)  Housing:  Low Risk (01/06/2025)  Transportation Needs: No Transportation Needs (01/06/2025)  Utilities: Not At Risk (01/06/2025)  Alcohol Screen: Low Risk (01/02/2025)  Depression (PHQ2-9): Low Risk (01/06/2025)  Financial Resource Strain: Patient Declined (01/02/2025)  Physical Activity: Sufficiently Active (01/06/2025)  Social Connections: Moderately Integrated (01/06/2025)  Stress: No Stress Concern Present (01/06/2025)  Tobacco Use: Low Risk (01/06/2025)  Health Literacy: Adequate Health Literacy (01/06/2025)   See flowsheets for full  screening details  Depression Screen PHQ 2 & 9 Depression Scale- Over the past 2 weeks, how often have you been bothered by any of the following problems? Little interest or pleasure in doing things: 0 Feeling down, depressed, or hopeless (PHQ Adolescent also includes...irritable): 0 PHQ-2 Total Score: 0 Trouble falling or staying asleep, or sleeping too much: 0 Feeling tired or having little energy: 0 Poor appetite or overeating (PHQ Adolescent also includes...weight loss): 0 Feeling bad about yourself - or that you are a failure or have let yourself or your family down: 0 Trouble concentrating on things, such as reading the newspaper or watching television (PHQ Adolescent also includes...like school work): 0 Moving or speaking so slowly that other people could have noticed. Or the opposite - being so fidgety or restless that you have been moving around a lot more than usual: 0 Thoughts that you would be better off dead, or of hurting yourself in some way: 0 PHQ-9 Total Score: 0 If you checked off any problems, how difficult have these problems made it for you to do your work, take care of things at home, or get along with other people?: Not difficult at all     Goals Addressed             This Visit's Progress    01/06/2025: Take it one day at a time.               Objective:    Today's Vitals   01/06/25 1553  BP: 127/87  Pulse: 97  Resp: 16  Temp: 98.2 F (36.8 C)  TempSrc: Oral  SpO2: 97%  Weight: 205 lb 12.8 oz (93.4 kg)  Height: 5' 7 (1.702 m)  PainSc: 0-No pain   Body mass index is 32.23 kg/m.  Hearing/Vision screen Hearing Screening - Comments:: Right ear buzzing sound, constant. Vision Screening - Comments:: Wear eyeglasses. Immunizations and Health Maintenance Health Maintenance  Topic Date Due   Diabetic kidney evaluation - Urine ACR  Never done   Colonoscopy  Never done   COVID-19 Vaccine (1 - 2025-26 season) Never done   Zoster Vaccines-  Shingrix (1 of 2) 02/11/2025 (Originally 02/26/1999)   Influenza Vaccine  03/24/2025 (Originally 07/25/2024)   Diabetic kidney evaluation - eGFR measurement  10/23/2025   Medicare Annual Wellness (AWV)  01/06/2026   Pneumococcal Vaccine: 50+ Years  Completed   Hepatitis C Screening  Completed   Meningococcal B Vaccine  Aged Out   DTaP/Tdap/Td  Discontinued        Assessment/Plan:  This is a routine wellness examination for [place].  Patient Care Team: [name], MD as PCP - General (Family Medicine) Lelon Glendia ONEIDA DEVONNA as Physician Assistant (Cardiology) Legrand Victory LITTIE MOULD, MD as Consulting Physician (Gastroenterology) Onita Duos, MD as Consulting Physician (Neurology)  I have personally reviewed and noted the following in the patients chart:   Medical and social history Use of alcohol, tobacco or illicit drugs  Current medications and supplements including opioid prescriptions. Functional ability and status Nutritional status Physical activity Advanced directives List  of other physicians Hospitalizations, surgeries, and ER visits in previous 12 months Vitals Screenings to include cognitive, depression, and falls Referrals and appointments  No orders of the defined types were placed in this encounter.  In addition, I have reviewed and discussed with patient certain preventive protocols, quality metrics, and best practice recommendations. A written personalized care plan for preventive services as well as general preventive health recommendations were provided to patient.   Roz LOISE Fuller, LPN   [id number]/7973   Return in about 1 year (around 01/06/2026) for Medicare wellness.  After Visit Summary: (MyChart) Due to this being a telephonic visit, the after visit summary with patients personalized plan was offered to patient via MyChart   Nurse Notes: Patient is due for Urine ACR. "

## 2025-01-06 NOTE — Telephone Encounter (Signed)
 Requested Prescriptions  Pending Prescriptions Disp Refills   metoprolol  succinate (TOPROL -XL) 25 MG 24 hr tablet [Pharmacy Med Name: METOPROLOL  SUCC ER 25 MG TAB] 90 tablet 0    Sig: TAKE 1 TABLET (25 MG TOTAL) BY MOUTH DAILY.     Cardiovascular:  Beta Blockers Passed - 01/06/2025  4:10 PM      Passed - Last BP in normal range    BP Readings from Last 1 Encounters:  01/06/25 127/87         Passed - Last Heart Rate in normal range    Pulse Readings from Last 1 Encounters:  01/06/25 97         Passed - Valid encounter within last 6 months    Recent Outpatient Visits           1 month ago Tremor   [facility] [place]. [facility] [name], MD   6 months ago Tinnitus of right ear   [facility] [place]. [facility] [name], MD   11 months ago Suspected sleep apnea   [facility] [place]. [facility] [name], MD   1 year ago Hypertensive heart disease with chronic diastolic congestive heart failure [facility])   [facility] [place]. [facility] [name], MD   1 year ago Hypertensive heart disease with chronic diastolic congestive heart failure [facility])   [facility] [place]. [facility] Fleeta Tonia Garnette LITTIE, RPH-CPP       Future Appointments             In 5 months Stoneking, Adine PARAS., MD [facility]

## 2025-01-09 ENCOUNTER — Telehealth: Payer: Self-pay | Admitting: Family Medicine

## 2025-01-09 NOTE — Telephone Encounter (Signed)
 Pt confirmed appt

## 2025-01-12 ENCOUNTER — Ambulatory Visit: Attending: Family Medicine | Admitting: Family Medicine

## 2025-01-12 ENCOUNTER — Encounter: Payer: Self-pay | Admitting: Family Medicine

## 2025-01-12 VITALS — BP 124/72 | HR 63 | Temp 98.0°F | Ht 67.0 in | Wt 208.0 lb

## 2025-01-12 DIAGNOSIS — N401 Enlarged prostate with lower urinary tract symptoms: Secondary | ICD-10-CM

## 2025-01-12 DIAGNOSIS — I10 Essential (primary) hypertension: Secondary | ICD-10-CM | POA: Diagnosis not present

## 2025-01-12 DIAGNOSIS — I5032 Chronic diastolic (congestive) heart failure: Secondary | ICD-10-CM | POA: Diagnosis not present

## 2025-01-12 DIAGNOSIS — R569 Unspecified convulsions: Secondary | ICD-10-CM

## 2025-01-12 DIAGNOSIS — R251 Tremor, unspecified: Secondary | ICD-10-CM

## 2025-01-12 DIAGNOSIS — M1A0710 Idiopathic chronic gout, right ankle and foot, without tophus (tophi): Secondary | ICD-10-CM | POA: Diagnosis not present

## 2025-01-12 DIAGNOSIS — I110 Hypertensive heart disease with heart failure: Secondary | ICD-10-CM

## 2025-01-12 MED ORDER — TAMSULOSIN HCL 0.4 MG PO CAPS: 0.4000 mg | ORAL_CAPSULE | Freq: Every day | ORAL | 1 refills | Status: AC

## 2025-01-12 MED ORDER — METOPROLOL SUCCINATE ER 25 MG PO TB24: 25.0000 mg | ORAL_TABLET | Freq: Every day | ORAL | 1 refills | Status: AC

## 2025-01-12 MED ORDER — ALLOPURINOL 300 MG PO TABS: 300.0000 mg | ORAL_TABLET | Freq: Every day | ORAL | 1 refills | Status: AC

## 2025-01-12 MED ORDER — EMPAGLIFLOZIN 10 MG PO TABS: 10.0000 mg | ORAL_TABLET | Freq: Every day | ORAL | 1 refills | Status: AC

## 2025-01-12 MED ORDER — ATORVASTATIN CALCIUM 40 MG PO TABS: 40.0000 mg | ORAL_TABLET | Freq: Every day | ORAL | 1 refills | Status: AC

## 2025-01-12 MED ORDER — AMLODIPINE BESYLATE 2.5 MG PO TABS: 2.5000 mg | ORAL_TABLET | Freq: Every day | ORAL | 1 refills | Status: AC

## 2025-01-12 NOTE — Progress Notes (Signed)
 "  Subjective:  Patient ID: [name] [name], male    DOB: 1951/03/29  Age: 74 y.o. MRN: [id number]  CC: Medical Management of Chronic Issues (Right arm shaking )     Discussed the use of AI scribe software for clinical note transcription with the patient, who gave verbal consent to proceed.  History of Present Illness [name] [name] is a 74 year old male with a history of paroxysmal A. fib, atrial flutter, diastolic congestive heart failure (EF 55-60% in 11/2017), CAD (status post cardiac cath in 11/2015 - mid LAD 20% stenosis, dist LAD 40% stenosis, Ost RCA to prox RCA 30% stenosis, normal LV systolic function), sick sinus syndrome, COPD, GERD, seizures (previously managed by [facility]), BPH, hypogonadism (managed by endocrine),Gout who presents with worsening right hand tremors.  He has worsening tremors mainly in the right hand, with occasional left hand involvement. The tremors are now disabling and interfere with daily tasks such as swiping a card, causing embarrassment in public. He has a family history of similar tremors in his father and brother. He has no tremors of other body parts and no gait abnormalities. He has seizures treated with Lamictal  and has had no recent seizures or syncope. He is under the care of neurology which his last visit in 08/2023 and he has an upcoming visit in 03/2025.   He is sleeping more than usual but feels rested and denies lightheadedness or dizziness.  He has paroxysmal atrial fibrillation, prior coronary stents, and takes Plavix indefinitely. He denies recent palpitations, lightheadedness, or chest pain.  He was hospitalized for about a week for colon perforation from a chicken bone with sepsis and had a partial colonoscopy. He is scheduled for a complete colonoscopy later this month to confirm resolution.  He takes allopurinol  for gout, atorvastatin  for cholesterol, Jardiance  for cardiac protection, pantoprazole  as needed for reflux. Low-dose amlodipine  once  initiated at his last visit due to elevated blood pressure and now with good blood pressure control. He has had no recent gout flares.    Past Medical History:  Diagnosis Date   Anemia    Asthma    Atrial fibrillation (HCC)    CHF (congestive heart failure) (HCC)    COPD (chronic obstructive pulmonary disease) (HCC)    Heart disease    Hypertension    Syncope     Past Surgical History:  Procedure Laterality Date   CARDIAC CATHETERIZATION N/A 12/08/2015   Procedure: Left Heart Cath and Coronary Angiography;  Surgeon: Rober Chroman, MD;  Location: [facility];  Service: Cardiovascular;  Laterality: N/A;   ESOPHAGOSCOPY  2002   Rigid esophagoscopy and removal of esophageal foreign body.   FLEXIBLE SIGMOIDOSCOPY N/A 10/22/2024   Procedure: KINGSTON SIDE;  Surgeon: Charlanne Groom, MD;  Location: [facility];  Service: Gastroenterology;  Laterality: N/A;   FOREIGN BODY REMOVAL  10/22/2024   Procedure: REMOVAL, FOREIGN BODY;  Surgeon: Charlanne Groom, MD;  Location: [facility];  Service: Gastroenterology;;   FOREIGN BODY REMOVAL ESOPHAGEAL  2002   removal of swallowed chicken bone   SHOULDER CLOSED REDUCTION Left 12/01/2017   Procedure: CLOSED REDUCTION SHOULDER;  Surgeon: Celena Sharper, MD;  Location: [facility];  Service: Orthopedics;  Laterality: Left;    Family History  Problem Relation Age of Onset   Cancer Mother     Social History   Socioeconomic History   Marital status: Single    Spouse name: Not on file   Number of children: 1   Years of  education: college   Highest education level: Some college, no degree  Occupational History   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   Patient is semi retired.    Education college.   Right handed.   Caffeine two cokes daily.   Social Drivers of Health   Tobacco Use: Low Risk (01/06/2025)   Patient History    Smoking Tobacco Use: Never    Smokeless Tobacco Use: Never    Passive Exposure: Not on file  Financial Resource Strain: Patient Declined (01/02/2025)   Overall Financial Resource Strain (CARDIA)    Difficulty of Paying Living Expenses: Patient declined  Food Insecurity: No Food Insecurity (01/06/2025)   Epic    Worried About Programme Researcher, Broadcasting/film/video in the Last Year: Never true    Ran Out of Food in the Last Year: Never true  Transportation Needs: No Transportation Needs (01/06/2025)   Epic    Lack of Transportation (Medical): No    Lack of Transportation (Non-Medical): No  Physical Activity: Sufficiently Active (01/06/2025)   Exercise Vital Sign    Days of Exercise per Week: 5 days    Minutes of Exercise per Session: 30 min  Stress: No Stress Concern Present (01/06/2025)   [employer] of Occupational Health - Occupational Stress Questionnaire    Feeling of Stress: Not at all  Social Connections: Moderately Integrated (01/06/2025)   Social Connection and Isolation Panel    Frequency of Communication with Friends and Family: Three times a week    Frequency of Social Gatherings with Friends and Family: Twice a week    Attends Religious Services: More than 4 times per year    Active Member of Clubs or Organizations: Yes    Attends Banker Meetings: Patient declined    Marital Status: Never married  Depression (PHQ2-9): Low Risk (01/06/2025)   Depression (PHQ2-9)    PHQ-2 Score: 0  Alcohol Screen: Low Risk (01/02/2025)   Alcohol Screen    Last Alcohol Screening Score (AUDIT): 0  Housing: Low Risk (01/06/2025)   Epic    Unable to Pay for Housing in the Last Year: No    Number of Times Moved in the  Last Year: 0    Homeless in the Last Year: No  Utilities: Not At Risk (01/06/2025)   Epic    Threatened with loss of utilities: No  Health Literacy: Adequate Health Literacy (01/06/2025)   B1300 Health Literacy    Frequency of need for help with medical instructions: Never    Allergies[1]  Outpatient Medications Prior to Visit  Medication Sig Dispense Refill   acetaminophen  (TYLENOL ) 325 MG tablet Take 2 tablets (650 mg total) by mouth every 6 (six) hours as needed for mild pain (or Fever >/= 101). 30 tablet 0   albuterol  (PROVENTIL  HFA;VENTOLIN  HFA) 108 (90 Base) MCG/ACT inhaler Inhale 2 puffs into the lungs every 6 (six) hours as needed. 1 Inhaler 5   albuterol  (PROVENTIL ) (2.5 MG/3ML) 0.083% nebulizer solution Take 3 mLs (2.5 mg total) by nebulization every 6 (  six) hours as needed for wheezing or shortness of breath. 150 mL 1   ARNUITY ELLIPTA  100 MCG/ACT AEPB Inhale 1 puff into the lungs daily. 30 each 3   aspirin  EC 81 MG tablet Take 81 mg by mouth daily as needed.     digoxin  (LANOXIN ) 0.125 MG tablet Take 1 tablet (0.125 mg total) by mouth daily. 30 tablet 3   ferrous sulfate  325 (65 FE) MG tablet Take 325 mg by mouth daily with breakfast. Reported on 03/16/2016     fluticasone  (FLONASE ) 50 MCG/ACT nasal spray Place 2 sprays into both nostrils daily. 16 g 6   furosemide  (LASIX ) 20 MG tablet TAKE 1 TABLET BY MOUTH EVERY DAY 90 tablet 1   lamoTRIgine  (LAMICTAL ) 100 MG tablet TAKE 1 TABLET BY MOUTH TWICE A DAY 180 tablet 0   pantoprazole  (PROTONIX ) 40 MG tablet TAKE 1 TABLET BY MOUTH EVERY DAY 90 tablet 0   tacrolimus (PROTOPIC) 0.1 % ointment Apply 1 Application topically 2 (two) times daily.     allopurinol  (ZYLOPRIM ) 300 MG tablet TAKE 1 TABLET BY MOUTH EVERY DAY 90 tablet 1   amLODipine  (NORVASC ) 2.5 MG tablet Take 1 tablet (2.5 mg total) by mouth daily. 90 tablet 1   atorvastatin  (LIPITOR) 40 MG tablet Take 1 tablet (40 mg total) by mouth daily. 90 tablet 1   empagliflozin   (JARDIANCE ) 10 MG TABS tablet Take 1 tablet (10 mg total) by mouth daily before breakfast. 90 tablet 1   metoprolol  succinate (TOPROL -XL) 25 MG 24 hr tablet TAKE 1 TABLET (25 MG TOTAL) BY MOUTH DAILY. 90 tablet 0   tamsulosin  (FLOMAX ) 0.4 MG CAPS capsule Take 1 capsule (0.4 mg total) by mouth daily. 90 capsule 1   nitroGLYCERIN  (NITROSTAT ) 0.4 MG SL tablet Place 1 tablet (0.4 mg total) under the tongue every 5 (five) minutes as needed for chest pain. 25 tablet 1   No facility-administered medications prior to visit.     ROS Review of Systems  Constitutional:  Negative for activity change and appetite change.  HENT:  Negative for sinus pressure and sore throat.   Respiratory:  Negative for chest tightness, shortness of breath and wheezing.   Cardiovascular:  Negative for chest pain and palpitations.  Gastrointestinal:  Negative for abdominal distention, abdominal pain and constipation.  Genitourinary: Negative.   Musculoskeletal: Negative.   Neurological:  Positive for tremors.  Psychiatric/Behavioral:  Negative for behavioral problems and dysphoric mood.     Objective:  BP 124/72   Pulse 63   Temp 98 F (36.7 C) (Oral)   Ht 5' 7 (1.702 m)   Wt 208 lb (94.3 kg)   SpO2 99%   BMI 32.58 kg/m      01/12/2025   10:54 AM 01/06/2025    3:53 PM 12/15/2024    1:35 PM  BP/Weight  Systolic BP 124 127 132  Diastolic BP 72 87 60  Wt. (Lbs) 208 205.8 213  BMI 32.58 kg/m2 32.23 kg/m2 33.36 kg/m2    Wt Readings from Last 3 Encounters:  01/12/25 208 lb (94.3 kg)  01/06/25 205 lb 12.8 oz (93.4 kg)  12/15/24 213 lb (96.6 kg)      Physical Exam Constitutional:      Appearance: He is well-developed.  Cardiovascular:     Rate and Rhythm: Normal rate.     Heart sounds: Normal heart sounds. No murmur heard. Pulmonary:     Effort: Pulmonary effort is normal.     Breath sounds: Normal breath sounds. No  wheezing or rales.  Chest:     Chest wall: No tenderness.  Abdominal:      General: Bowel sounds are normal. There is no distension.     Palpations: Abdomen is soft. There is no mass.     Tenderness: There is no abdominal tenderness.  Musculoskeletal:        General: Normal range of motion.     Right lower leg: No edema.     Left lower leg: No edema.  Neurological:     Mental Status: He is alert and oriented to person, place, and time.  Psychiatric:        Mood and Affect: Mood normal.        Latest Ref Rng & Units 10/23/2024    9:06 AM 10/22/2024    7:48 AM 10/21/2024    7:41 AM  CMP  Glucose 70 - 99 mg/dL 86  88  83   BUN 8 - 23 mg/dL 5  6  11    Creatinine 0.61 - 1.24 mg/dL 8.56  8.46  8.37   Sodium 135 - 145 mmol/L 138  139  137   Potassium 3.5 - 5.1 mmol/L 3.8  4.0  3.8   Chloride 98 - 111 mmol/L 104  106  104   CO2 22 - 32 mmol/L 22  25  24    Calcium  8.9 - 10.3 mg/dL 8.8  8.7  8.9     Lipid Panel     Component Value Date/Time   CHOL 146 10/07/2021 1017   TRIG 85 10/07/2021 1017   HDL 45 10/07/2021 1017   CHOLHDL 3.2 10/07/2021 1017   CHOLHDL 2.5 12/09/2015 0133   VLDL 11 12/09/2015 0133   LDLCALC 85 10/07/2021 1017    CBC    Component Value Date/Time   WBC 7.5 10/23/2024 0906   RBC 4.55 10/23/2024 0906   HGB 10.1 (L) 10/23/2024 0906   HGB 11.5 (L) 03/08/2022 1215   HCT 33.4 (L) 10/23/2024 0906   HCT 37.6 03/08/2022 1215   PLT 300 10/23/2024 0906   PLT 357 03/08/2022 1215   MCV 73.4 (L) 10/23/2024 0906   MCV 71 (L) 03/08/2022 1215   MCH 22.2 (L) 10/23/2024 0906   MCHC 30.2 10/23/2024 0906   RDW 16.3 (H) 10/23/2024 0906   RDW 16.0 (H) 03/08/2022 1215   LYMPHSABS 1.1 10/22/2024 0748   LYMPHSABS 2.4 03/08/2022 1215   MONOABS 0.9 10/22/2024 0748   EOSABS 0.5 10/22/2024 0748   EOSABS 0.6 (H) 03/08/2022 1215   BASOSABS 0.1 10/22/2024 0748   BASOSABS 0.1 03/08/2022 1215    Lab Results  Component Value Date   HGBA1C 5.3 10/20/2024    Lab Results  Component Value Date   TSH 1.480 12/11/2024       Assessment &  Plan Tremor Intermittent right upper extremity tremor, primarily affecting the right hand. Family history of tremors.  -Normal thyroid  panel - Contact neurology to schedule an earlier appointment for tremor evaluation.  Hypertensive heart disease with chronic diastolic congestive heart failure Euvolemic with EF of 55 to 60% from echo of 11/2017 He is due for repeat echocardiogram Blood pressure well-controlled. No recent episodes of palpitations, lightheadedness, or chest pain. No shortness of breath during daily activities, but experiences shortness of breath when getting meals. No ankle swelling reported. - Continue current medication regimen -SGLT2i, beta-blocker - Continue follow-up with cardiologist-Dr. Levern every three months.  Essential hypertension Blood pressure well-controlled. No symptoms of lightheadedness or other side effects reported. -  Continue current antihypertensive medications. -Counseled on blood pressure goal of less than 130/80, low-sodium, DASH diet, medication compliance, 150 minutes of moderate intensity exercise per week. Discussed medication compliance, adverse effects.   Seizures - Controlled - Continue Lamictal  - Continue to follow-up with neurology  Idiopathic chronic gout of right ankle No recent gout flares reported. - Continue allopurinol  for gout management.  Benign prostatic hyperplasia with lower urinary tract symptoms Continues to see urologist for management. -Controlled on Flomax  - Continue follow-up with urologist Dr. Bethena Novak.  General health maintenance Scheduled for a complete colonoscopy later this month due to previous colon perforation from a foreign body. No recent cholesterol check due to non-fasting state during visit. - Proceed with scheduled colonoscopy. - Schedule fasting cholesterol test for next visit.      Meds ordered this encounter  Medications   allopurinol  (ZYLOPRIM ) 300 MG tablet    Sig: Take 1 tablet (300  mg total) by mouth daily.    Dispense:  90 tablet    Refill:  1   amLODipine  (NORVASC ) 2.5 MG tablet    Sig: Take 1 tablet (2.5 mg total) by mouth daily.    Dispense:  90 tablet    Refill:  1   atorvastatin  (LIPITOR) 40 MG tablet    Sig: Take 1 tablet (40 mg total) by mouth daily.    Dispense:  90 tablet    Refill:  1   empagliflozin  (JARDIANCE ) 10 MG TABS tablet    Sig: Take 1 tablet (10 mg total) by mouth daily before breakfast.    Dispense:  90 tablet    Refill:  1   metoprolol  succinate (TOPROL -XL) 25 MG 24 hr tablet    Sig: Take 1 tablet (25 mg total) by mouth daily.    Dispense:  90 tablet    Refill:  1   tamsulosin  (FLOMAX ) 0.4 MG CAPS capsule    Sig: Take 1 capsule (0.4 mg total) by mouth daily.    Dispense:  90 capsule    Refill:  1    Follow-up: Return in about 6 months (around 07/12/2025).       Corrina Sabin, MD, FAAFP. [facility] [place], [place] [phone]   01/12/2025, 11:35 AM    [1] No Known Allergies  "

## 2025-01-12 NOTE — Patient Instructions (Signed)
 VISIT SUMMARY:  During your visit, we discussed your worsening right hand tremors, your current medications, and your overall health. We reviewed your history of seizures, atrial fibrillation, and recent hospitalization for colon perforation. We also discussed your blood pressure, gout, and prostate health.  YOUR PLAN:  -TREMOR: You have intermittent tremors in your right hand, which are affecting your daily activities. We will contact neurology to schedule an earlier appointment for further evaluation. The neurologist does not suspect Parkinson's disease at this time.  -HYPERTENSIVE HEART DISEASE WITH CHRONIC DIASTOLIC CONGESTIVE HEART FAILURE: This condition means your heart has difficulty relaxing and filling with blood, leading to symptoms like shortness of breath. Your blood pressure is well-controlled, and you should continue your current medications and follow up with your cardiologist every three months.  -ESSENTIAL HYPERTENSION: Your blood pressure is well-controlled with your current medications. Essential hypertension is high blood pressure with no identifiable cause. Continue taking your antihypertensive medications as prescribed.  -IDIOPATHIC CHRONIC GOUT OF RIGHT ANKLE: Gout is a form of arthritis characterized by sudden, severe attacks of pain, redness, and tenderness in joints. You have not had any recent gout flares. Continue taking allopurinol  to manage your gout.  -BENIGN PROSTATIC HYPERPLASIA WITH LOWER URINARY TRACT SYMPTOMS: This condition involves an enlarged prostate gland, which can cause urinary symptoms. Continue following up with your urologist, Dr. Bethena Novak, for management.  -GENERAL HEALTH MAINTENANCE: You are scheduled for a complete colonoscopy later this month to confirm the resolution of your previous colon perforation. We will also schedule a fasting cholesterol test for your next visit.  INSTRUCTIONS:  Please proceed with your scheduled colonoscopy later this  month. We will contact neurology to schedule an earlier appointment for your tremor evaluation. Continue taking your current medications and follow up with your cardiologist every three months. Schedule a fasting cholesterol test for your next visit.

## 2025-01-14 ENCOUNTER — Encounter: Payer: Self-pay | Admitting: Gastroenterology

## 2025-01-15 ENCOUNTER — Telehealth: Payer: Self-pay

## 2025-01-15 DIAGNOSIS — I110 Hypertensive heart disease with heart failure: Secondary | ICD-10-CM

## 2025-01-15 NOTE — Telephone Encounter (Signed)
 Copied from CRM #[id number]. Topic: Referral - Request for Referral >> Jan 15, 2025  2:45 PM Gattis SQUIBB wrote: Mliss with Dr. Talitha office needing a retro referral from 12/26/24.  He has [employer] and they requested it.  Fx [phone]  Diagnosis Code I2510, I10

## 2025-01-15 NOTE — Telephone Encounter (Signed)
 Routing to PCP for review.

## 2025-01-16 NOTE — Telephone Encounter (Signed)
 Referral has been placed. Thanks!

## 2025-01-16 NOTE — Telephone Encounter (Signed)
 Noted.

## 2025-01-16 NOTE — Addendum Note (Signed)
 Addended by: Adonai Selsor on: 01/16/2025 09:30 AM   Modules accepted: Orders

## 2025-01-21 ENCOUNTER — Ambulatory Visit: Admitting: Gastroenterology

## 2025-01-21 ENCOUNTER — Encounter: Admitting: Gastroenterology

## 2025-01-21 ENCOUNTER — Encounter: Payer: Self-pay | Admitting: Gastroenterology

## 2025-01-21 DIAGNOSIS — R933 Abnormal findings on diagnostic imaging of other parts of digestive tract: Secondary | ICD-10-CM

## 2025-01-21 NOTE — Progress Notes (Signed)
 Pt arrived for procedure and states that he wasn't able to drink the prep and is still having solid stool.  Rescheduled for 02/23/25 with a PV on 02/09/25.

## 2025-01-23 ENCOUNTER — Encounter: Payer: Self-pay | Admitting: Internal Medicine

## 2025-01-23 ENCOUNTER — Ambulatory Visit: Attending: Internal Medicine | Admitting: Internal Medicine

## 2025-01-23 VITALS — BP 134/70 | HR 69 | Ht 67.0 in | Wt 206.0 lb

## 2025-01-23 DIAGNOSIS — I10 Essential (primary) hypertension: Secondary | ICD-10-CM

## 2025-01-23 DIAGNOSIS — I480 Paroxysmal atrial fibrillation: Secondary | ICD-10-CM | POA: Diagnosis not present

## 2025-01-23 DIAGNOSIS — R251 Tremor, unspecified: Secondary | ICD-10-CM | POA: Diagnosis not present

## 2025-01-23 DIAGNOSIS — I5032 Chronic diastolic (congestive) heart failure: Secondary | ICD-10-CM

## 2025-01-23 NOTE — Patient Instructions (Signed)
 Medication Instructions:   Your physician recommends that you continue on your current medications as directed. Please refer to the Current Medication list given to you today.  *If you need a refill on your cardiac medications before your next appointment, please call your pharmacy*   Lab Work:   PLEASE GO DOWN STAIRS  [facility]  FIRST FLOOR   ( GET OFF ELEVATORS WALK TOWARDS WAITING AREA LAB LOCATED BY PHARMACY):  RETURN FOR DIG LEVEL  (  DO NOT TAKE  UNTIL. AFTER LAB WORK )   If you have labs (blood work) drawn today and your tests are completely normal, you will receive your results only by: MyChart Message (if you have MyChart) OR A paper copy in the mail If you have any lab test that is abnormal or we need to change your treatment, we will call you to review the results.   Testing/Procedures:  Your physician has requested that you have an echocardiogram. Echocardiography is a painless test that uses sound waves to create images of your heart. It provides your doctor with information about the size and shape of your heart and how well your hearts chambers and valves are working. This procedure takes approximately one hour. There are no restrictions for this procedure. Please do NOT wear cologne, perfume, aftershave, or lotions (deodorant is allowed). Please arrive 15 minutes prior to your appointment time.  Please note: We ask at that you not bring children with you during ultrasound (echo/ vascular) testing. Due to room size and safety concerns, children are not allowed in the ultrasound rooms during exams. Our front office staff cannot provide observation of children in our lobby area while testing is being conducted. An adult accompanying a patient to their appointment will only be allowed in the ultrasound room at the discretion of the ultrasound technician under special circumstances. We apologize for any inconvenience.    Follow-Up: At [facility], you and your health  needs are our priority.  As part of our continuing mission to provide you with exceptional heart care, our providers are all part of one team.  This team includes your primary Cardiologist (physician) and Advanced Practice Providers or APPs (Physician Assistants and Nurse Practitioners) who all work together to provide you with the care you need, when you need it.  Your next appointment:   3 month(s)  Provider:   Kriste    We recommend signing up for the patient portal called MyChart.  Sign up information is provided on this After Visit Summary.  MyChart is used to connect with patients for Virtual Visits (Telemedicine).  Patients are able to view lab/test results, encounter notes, upcoming appointments, etc.  Non-urgent messages can be sent to your provider as well.   To learn more about what you can do with MyChart, go to [email or web address].   Other Instructions

## 2025-01-23 NOTE — Progress Notes (Signed)
 " Cardiology Office Note:  .   Date:  01/23/2025  ID:  [name] [name], DOB [birth year], MRN [id number] PCP: [name], MD  [facility] Providers Cardiologist:  None Cardiology APP:  Lelon Glendia DASEN, PA-C    History of Present Illness: .     Discussed the use of AI scribe software for clinical note transcription with the patient, who gave verbal consent to proceed.  History of Present Illness [name] [name] is a 74 year old male who is a former patient of Dr. Levern with diastolic heart failure who presents with increasing right-handed tremor and fatigue. He was referred by his PCP for evaluation of diastolic heart failure.  Tremor - Increasing right-handed tremor since January 19th - Tremor is disabling - No associated chest pain or palpitations  Fatigue and exercise intolerance - Increasing fatigue - Shortness of breath with exertion, especially when walking uphill to mailbox two blocks from home - No longer able to dance or perform long-distance walking due to decreased stamina  Paroxysmal symptoms - Occasional episodes of feeling 'all shook up' - No chest pain or racing heart during these episodes  Cardiac history and prior testing - Diastolic heart failure - Paroxysmal atrial fibrillation and atrial flutter - Coronary artery disease with multivessel nonobstructive disease - Left heart catheterization on December 08, 2015: 30% stenosis in osteo RCA to proximal RCA, 20% stenosis in mid LAD, 40% stenosis in distal LAD - Echocardiogram on December 02, 2017: ejection fraction 55-60% - Echocardiogram on December 14, 2015: ejection fraction 50-55%  Medication history and adverse events - Current medications: amlodipine  2.5 mg, atorvastatin  40 mg, digoxin  0.125 mg, Jardiance  10 mg, Lasix  20 mg every other day, lamotrigine  100 mg twice daily, metoprolol  succinate 25 mg daily - Previously on Xarelto , discontinued due to hemorrhagic pericarditis  Hemorrhagic  pericarditis - History of hemorrhagic pericarditis leading to discontinuation of Xarelto  - No current chest pain or worsening shortness of breath          ROS: Remaining review of systems negative  Studies Reviewed: .        Results Diagnostic Left heart catheterization (12/08/2015): 30% stenosis from ostial right coronary artery to proximal right coronary artery, 20% stenosis in mid left anterior descending artery, 40% stenosis in distal left anterior descending artery Echocardiogram (12/02/2017): Ejection fraction 55-60%, normal diastolic function Echocardiogram (12/14/2015): Ejection fraction 50-55%, grade 1 diastolic dysfunction, septal paradoxical motion, small circumferential pericardial effusion without tamponade Risk Assessment/Calculations:    CHA2DS2-VASc Score = 6   This indicates a 9.7% annual risk of stroke. The patient's score is based upon: CHF History: 1 HTN History: 1 Diabetes History: 1 Stroke History: 0 Vascular Disease History: 1 Age Score: 2 Gender Score: 0            Physical Exam:   VS:  BP 134/70   Pulse 69   Ht 5' 7 (1.702 m)   Wt 206 lb (93.4 kg)   SpO2 96%   BMI 32.26 kg/m    Wt Readings from Last 3 Encounters:  01/23/25 206 lb (93.4 kg)  01/12/25 208 lb (94.3 kg)  01/06/25 205 lb 12.8 oz (93.4 kg)    GEN: Well nourished, well developed in no acute distress NECK: No JVD; No carotid bruits CARDIAC:  RRR, no murmurs, no rubs, no gallops RESPIRATORY:  Clear to auscultation without rales, wheezing or rhonchi  ABDOMEN: Soft, non-tender, non-distended EXTREMITIES:  No edema; No deformity   ASSESSMENT AND PLAN: .  Assessment and Plan Assessment & Plan Heart failure with preserved ejection fraction (HFpEF) HFpEF with grade 1 diastolic dysfunction and septal paradoxical motion. Echocardiogram shows EF 50-55%. - Repeat echocardiogram to assess diastolic function and heart relaxation. - Check digoxin  level for therapeutic range and  potential tremors.  Paroxysmal atrial fibrillation and atrial flutter Managed with digoxin  and metoprolol . No anticoagulation due to previous hemorrhagic pericarditis with Xarelto .  - Continue digoxin  and metoprolol .  Coronary artery disease, nonobstructive Nonobstructive multivessel disease with previous catheterization showing 30% stenosis in proximal RCA, 20% in mid LAD, and 40% in distal LAD. No current ischemic symptoms. Currently on aspirin  PRN?  - Continue current management and monitoring. - would advise aspirin  81 mg daily  HTN  DM  Tremors - Digoxin  level - Neurology per PCP            Follow up: 3 months. Wishes to transfer care to us  for now.   Bonney Emeline FORBES Kriste, DO  01/23/2025 12:25 PM    [facility] "

## 2025-02-09 ENCOUNTER — Encounter

## 2025-02-23 ENCOUNTER — Encounter: Admitting: Gastroenterology

## 2025-03-12 ENCOUNTER — Ambulatory Visit: Admitting: Podiatry

## 2025-03-18 ENCOUNTER — Ambulatory Visit (HOSPITAL_COMMUNITY)

## 2025-04-09 ENCOUNTER — Institutional Professional Consult (permissible substitution): Admitting: Neurology

## 2025-04-22 ENCOUNTER — Ambulatory Visit: Admitting: Internal Medicine

## 2025-06-11 ENCOUNTER — Ambulatory Visit: Admitting: Urology

## 2025-07-13 ENCOUNTER — Ambulatory Visit: Payer: Self-pay | Admitting: Family Medicine
